# Patient Record
Sex: Female | Born: 1965 | Race: Black or African American | Hispanic: No | Marital: Married | Smoking: Never smoker
Health system: Southern US, Community
[De-identification: ages and names within clinical notes are randomized; demographics above are authoritative.]

## PROBLEM LIST (undated history)

## (undated) DIAGNOSIS — O039 Complete or unspecified spontaneous abortion without complication: Secondary | ICD-10-CM

## (undated) DIAGNOSIS — N6019 Diffuse cystic mastopathy of unspecified breast: Secondary | ICD-10-CM

## (undated) DIAGNOSIS — C801 Malignant (primary) neoplasm, unspecified: Secondary | ICD-10-CM

## (undated) DIAGNOSIS — G43909 Migraine, unspecified, not intractable, without status migrainosus: Secondary | ICD-10-CM

## (undated) DIAGNOSIS — Z803 Family history of malignant neoplasm of breast: Secondary | ICD-10-CM

## (undated) DIAGNOSIS — O09A0 Supervision of pregnancy with history of molar pregnancy, unspecified trimester: Secondary | ICD-10-CM

## (undated) DIAGNOSIS — I10 Essential (primary) hypertension: Secondary | ICD-10-CM

## (undated) DIAGNOSIS — O019 Hydatidiform mole, unspecified: Secondary | ICD-10-CM

## (undated) HISTORY — PX: BREAST LUMPECTOMY: SHX2

## (undated) HISTORY — PX: PORTACATH PLACEMENT: SHX2246

## (undated) HISTORY — PX: WISDOM TOOTH EXTRACTION: SHX21

## (undated) HISTORY — PX: DILATION AND CURETTAGE OF UTERUS: SHX78

## (undated) HISTORY — DX: Migraine, unspecified, not intractable, without status migrainosus: G43.909

## (undated) HISTORY — DX: Family history of malignant neoplasm of breast: Z80.3

## (undated) HISTORY — DX: Essential (primary) hypertension: I10

## (undated) HISTORY — DX: Diffuse cystic mastopathy of unspecified breast: N60.19

## (undated) HISTORY — DX: Hydatidiform mole, unspecified: O01.9

## (undated) HISTORY — DX: Supervision of pregnancy with history of molar pregnancy, unspecified trimester: O09.A0

## (undated) HISTORY — DX: Complete or unspecified spontaneous abortion without complication: O03.9

## (undated) MED FILL — Dexamethasone Sodium Phosphate Inj 100 MG/10ML: INTRAMUSCULAR | Qty: 1 | Status: AC

---

## 1999-02-08 HISTORY — PX: BREAST BIOPSY: SHX20

## 1999-11-11 DIAGNOSIS — Z9889 Other specified postprocedural states: Secondary | ICD-10-CM | POA: Insufficient documentation

## 2006-05-12 ENCOUNTER — Other Ambulatory Visit: Admission: RE | Admit: 2006-05-12 | Discharge: 2006-05-12 | Payer: Self-pay | Admitting: Obstetrics and Gynecology

## 2006-11-21 ENCOUNTER — Ambulatory Visit (HOSPITAL_COMMUNITY): Admission: RE | Admit: 2006-11-21 | Discharge: 2006-11-21 | Payer: Self-pay | Admitting: Obstetrics and Gynecology

## 2006-12-12 ENCOUNTER — Ambulatory Visit (HOSPITAL_COMMUNITY): Admission: RE | Admit: 2006-12-12 | Discharge: 2006-12-12 | Payer: Self-pay | Admitting: Obstetrics and Gynecology

## 2007-06-04 ENCOUNTER — Inpatient Hospital Stay (HOSPITAL_COMMUNITY): Admission: AD | Admit: 2007-06-04 | Discharge: 2007-06-06 | Payer: Self-pay | Admitting: Obstetrics and Gynecology

## 2008-06-03 ENCOUNTER — Other Ambulatory Visit: Admission: RE | Admit: 2008-06-03 | Discharge: 2008-06-03 | Payer: Self-pay | Admitting: Obstetrics and Gynecology

## 2009-02-07 HISTORY — PX: DILATION AND EVACUATION: SHX1459

## 2009-09-11 ENCOUNTER — Other Ambulatory Visit: Admission: RE | Admit: 2009-09-11 | Discharge: 2009-09-11 | Payer: Self-pay | Admitting: Obstetrics and Gynecology

## 2009-10-14 ENCOUNTER — Ambulatory Visit (HOSPITAL_COMMUNITY)
Admission: RE | Admit: 2009-10-14 | Discharge: 2009-10-14 | Payer: Self-pay | Source: Home / Self Care | Admitting: Obstetrics and Gynecology

## 2010-02-28 ENCOUNTER — Encounter: Payer: Self-pay | Admitting: Obstetrics and Gynecology

## 2010-11-02 LAB — CBC
HCT: 24.6 — ABNORMAL LOW
HCT: 32.3 — ABNORMAL LOW
Hemoglobin: 11 — ABNORMAL LOW
Hemoglobin: 8.4 — ABNORMAL LOW
MCHC: 34
MCHC: 34.2
MCV: 88.7
MCV: 90.3
Platelets: 121 — ABNORMAL LOW
Platelets: 142 — ABNORMAL LOW
RBC: 2.73 — ABNORMAL LOW
RBC: 3.64 — ABNORMAL LOW
RDW: 15.1
RDW: 15.2
WBC: 10.2
WBC: 8.2

## 2010-11-02 LAB — RPR: RPR Ser Ql: NONREACTIVE

## 2011-02-08 DIAGNOSIS — O019 Hydatidiform mole, unspecified: Secondary | ICD-10-CM

## 2011-02-08 DIAGNOSIS — C801 Malignant (primary) neoplasm, unspecified: Secondary | ICD-10-CM

## 2011-02-08 HISTORY — DX: Malignant (primary) neoplasm, unspecified: C80.1

## 2011-02-08 HISTORY — DX: Hydatidiform mole, unspecified: O01.9

## 2011-11-08 HISTORY — PX: PORTACATH PLACEMENT: SHX2246

## 2011-11-23 ENCOUNTER — Encounter: Payer: Self-pay | Admitting: Gynecology

## 2011-11-23 ENCOUNTER — Other Ambulatory Visit (HOSPITAL_COMMUNITY): Payer: BC Managed Care – PPO

## 2011-11-23 ENCOUNTER — Ambulatory Visit: Payer: BC Managed Care – PPO | Attending: Gynecology | Admitting: Gynecology

## 2011-11-23 VITALS — BP 120/70 | HR 74 | Temp 98.8°F | Resp 20 | Ht 63.11 in | Wt 122.9 lb

## 2011-11-23 DIAGNOSIS — O019 Hydatidiform mole, unspecified: Secondary | ICD-10-CM | POA: Insufficient documentation

## 2011-11-23 NOTE — Progress Notes (Signed)
Consult Note: Gyn-Onc   Kaitlin Ferrell 46 y.o. female  Chief Complaint  Patient presents with  . GTD    New pt       HPI: 46 year old African American female seen in consultation at the request of Dr. Algie Ferrell regarding management of persistent gestational trophoblastic disease. The patient was found to have a molar pregnancy and underwent an office D&C on 09/30/2011. 3 days prior to the [facility] her hCG level was 21,067 and one week after the D&C had fallen to 339. Subsequent weekly hCGs fell to a low of 82 on September 13 but then began to rise and over 3 consecutive weeks From 133-142-143 (11/11/2011) the Patient Is Currently on Birth Control Pills. She Is Having Some Spotting. She Denies Any Other Abdominal Pain or Pelvic Symptoms. She Has No Pulmonary Symptoms. Overall Her Functional Status Is Excellent.    Review of Systems:10 point review of systems is negative as noted above.   Vitals: Blood pressure 120/70, pulse 74, temperature 98.8 F (37.1 C), temperature source Oral, resp. rate 20, height 5' 3.11" (1.603 m), weight 122 lb 14.4 oz (55.747 kg), last menstrual period 11/09/2011.  Physical Exam: General : The patient is a healthy woman in no acute distress.  HEENT: normocephalic, extraoccular movements normal; neck is supple without thyromegally  Lynphnodes: Supraclavicular and inguinal nodes not enlarged  Abdomen: Soft, non-tender, no ascites, no organomegally, no masses, no hernias  Pelvic:  EGBUS: Normal female  Vagina: Normal, no lesions, there is blood in the vagina  Urethra and Bladder: Normal, non-tender  Cervix: Normal Uterus: Normal in size although there's some irregularity consistent with a uterine fibroid on the right.  Bi-manual examination: Non-tender; no adenxal masses or nodularity  Rectal: normal sphincter tone, no masses, no blood  Lower extremities: No edema or varicosities. Normal range of motion    Assessment/Plan: Rising hCG following evacuation of a  molar pregnancy.  I recommend the patient undergo a staging CT scan of the chest, abdomen and pelvis. If she has no evidence of metastatic disease, or low risk metastatic disease, then I would recommend she be treated with dactinomycin 1.25 mg per meter squared every other week until hCG level is normal. (This regimen is superior to IM methotrexate given weekly in a randomized GOG trial) If she has evidence of metastatic disease then we will need to consider multiagent chemotherapy. We will contact patient with results of her CT scan and make appropriate referral to a medical oncologist. Allergies  Allergen Reactions  . Labetalol Hives, Itching and Swelling    Past Medical History  Diagnosis Date  . Miscarriage   . Fibrocystic breast changes   . Migraine   . GTD (gestational trophoblastic disease)     Past Surgical History  Procedure Date  . Dilation and curettage of uterus 1992, 09/2011  . Wisdom tooth extraction     in 11th grade  . Breast biopsy 2001    Left  . Dilation and evacuation 2011    Current Outpatient Prescriptions  Medication Sig Dispense Refill  . ibuprofen (ADVIL,MOTRIN) 200 MG tablet Take 200 mg by mouth every 6 (six) hours as needed.      . Norethindrone-Ethinyl Estradiol-Fe Biphas (LO LOESTRIN FE) 1 MG-10 MCG / 10 MCG tablet Take 1 tablet by mouth daily.      . SUMAtriptan (IMITREX) 100 MG tablet Take 100 mg by mouth every 2 (two) hours as needed.      . topiramate (TOPAMAX) 25 MG tablet Take 25  mg by mouth 3 (three) times daily.        History   Social History  . Marital Status: Married    Spouse Name: N/A    Number of Children: N/A  . Years of Education: N/A   Occupational History  . Not on file.   Social History Main Topics  . Smoking status: Never Smoker   . Smokeless tobacco: Not on file  . Alcohol Use: Yes     occas  . Drug Use: No  . Sexually Active: Not on file   Other Topics Concern  . Not on file   Social History Narrative  . No  narrative on file    Family History  Problem Relation Age of Onset  . Breast cancer Mother   . Heart attack Mother   . Diabetes Father   . Breast cancer Maternal Aunt   . Breast cancer Maternal Grandmother   . Stroke Paternal Grandmother   . Breast cancer Maternal Aunt       Jeannette Corpus, MD 11/23/2011, 12:56 PM

## 2011-11-23 NOTE — Patient Instructions (Signed)
We'll obtain a CT scan as soon as possible. We'll contact you with the scan results as well as make arrangements really seen by a medical oncologist.

## 2011-11-24 ENCOUNTER — Other Ambulatory Visit: Payer: Self-pay

## 2011-11-24 ENCOUNTER — Telehealth: Payer: Self-pay | Admitting: Oncology

## 2011-11-24 DIAGNOSIS — O019 Hydatidiform mole, unspecified: Secondary | ICD-10-CM

## 2011-11-24 NOTE — Progress Notes (Signed)
Discussed central line placement with Ms. Kaitlin Ferrell.  She states that her veins are difficult to stick.  A butterfly needle is used in her antecubital.  Pt. Comfortable with Pac placement and sent order to IR to have it set up for 12-01-11.

## 2011-11-24 NOTE — Telephone Encounter (Signed)
S/W pt in re NP appt 10/23 @ 1 w/ Dr. Darrold Span  Chemo Edu 10/22 @ 9:30

## 2011-11-25 ENCOUNTER — Telehealth: Payer: Self-pay | Admitting: Oncology

## 2011-11-25 ENCOUNTER — Encounter (HOSPITAL_COMMUNITY): Payer: Self-pay | Admitting: Pharmacy Technician

## 2011-11-25 ENCOUNTER — Ambulatory Visit (HOSPITAL_COMMUNITY)
Admission: RE | Admit: 2011-11-25 | Discharge: 2011-11-25 | Disposition: A | Discharge status: Home / Self Care | Payer: BC Managed Care – PPO | Source: Ambulatory Visit | Attending: Gynecologic Oncology | Admitting: Gynecologic Oncology

## 2011-11-25 ENCOUNTER — Other Ambulatory Visit: Payer: Self-pay | Admitting: Radiology

## 2011-11-25 ENCOUNTER — Other Ambulatory Visit (HOSPITAL_COMMUNITY): Payer: BC Managed Care – PPO

## 2011-11-25 DIAGNOSIS — J984 Other disorders of lung: Secondary | ICD-10-CM | POA: Insufficient documentation

## 2011-11-25 DIAGNOSIS — K7689 Other specified diseases of liver: Secondary | ICD-10-CM | POA: Insufficient documentation

## 2011-11-25 DIAGNOSIS — O019 Hydatidiform mole, unspecified: Secondary | ICD-10-CM | POA: Insufficient documentation

## 2011-11-25 DIAGNOSIS — K429 Umbilical hernia without obstruction or gangrene: Secondary | ICD-10-CM | POA: Insufficient documentation

## 2011-11-25 MED ORDER — IOHEXOL 300 MG/ML  SOLN
100.0000 mL | Freq: Once | INTRAMUSCULAR | Status: AC | PRN
Start: 2011-11-25 — End: 2011-11-25
  Administered 2011-11-25: 100 mL via INTRAVENOUS

## 2011-11-25 NOTE — Telephone Encounter (Signed)
Kaitlin Ferrell called to let Dr. Darrold Span know that the portacath is to be placed on Tuesday 11-29-11 at [facility] at 1430.  [name] is aware of appt.

## 2011-11-25 NOTE — Telephone Encounter (Signed)
C/D 11/25/11 for appt 11/30/11 °

## 2011-11-28 ENCOUNTER — Telehealth: Payer: Self-pay | Admitting: Gynecologic Oncology

## 2011-11-28 NOTE — Telephone Encounter (Signed)
Pt notified of CT scan results:  No definitive evidence of metastatic disease in the chest, abdomen, or pelvis.  Appointment arranged with Dr. Darrold Span for Nov 30, 2011.  Instructed to call for any questions or concerns.  No issues voiced.

## 2011-11-29 ENCOUNTER — Ambulatory Visit (HOSPITAL_COMMUNITY)
Admission: RE | Admit: 2011-11-29 | Discharge: 2011-11-29 | Disposition: A | Discharge status: Home / Self Care | Payer: BC Managed Care – PPO | Source: Ambulatory Visit | Attending: Oncology | Admitting: Oncology

## 2011-11-29 ENCOUNTER — Other Ambulatory Visit: Payer: BC Managed Care – PPO

## 2011-11-29 ENCOUNTER — Other Ambulatory Visit: Payer: Self-pay | Admitting: Oncology

## 2011-11-29 DIAGNOSIS — O019 Hydatidiform mole, unspecified: Secondary | ICD-10-CM | POA: Insufficient documentation

## 2011-11-29 LAB — CBC
HCT: 37.3 % (ref 36.0–46.0)
Hemoglobin: 12.4 g/dL (ref 12.0–15.0)
MCH: 28.5 pg (ref 26.0–34.0)
MCHC: 33.2 g/dL (ref 30.0–36.0)
MCV: 85.7 fL (ref 78.0–100.0)
Platelets: 211 10*3/uL (ref 150–400)
RBC: 4.35 MIL/uL (ref 3.87–5.11)
RDW: 13.6 % (ref 11.5–15.5)
WBC: 6.9 10*3/uL (ref 4.0–10.5)

## 2011-11-29 LAB — APTT: aPTT: 33 seconds (ref 24–37)

## 2011-11-29 LAB — PROTIME-INR
INR: 1.02 (ref 0.00–1.49)
Prothrombin Time: 13.3 seconds (ref 11.6–15.2)

## 2011-11-29 MED ORDER — FENTANYL CITRATE 0.05 MG/ML IJ SOLN
INTRAMUSCULAR | Status: AC | PRN
  Administered 2011-11-29 (×2): 100 ug via INTRAVENOUS

## 2011-11-29 MED ORDER — HEPARIN SOD (PORK) LOCK FLUSH 100 UNIT/ML IV SOLN
500.0000 [IU] | Freq: Once | INTRAVENOUS | Status: AC
  Administered 2011-11-29: 500 [IU] via INTRAVENOUS

## 2011-11-29 MED ORDER — MIDAZOLAM HCL 2 MG/2ML IJ SOLN
INTRAMUSCULAR | Status: AC | PRN
  Administered 2011-11-29 (×2): 2 mg via INTRAVENOUS

## 2011-11-29 MED ORDER — SODIUM CHLORIDE 0.9 % IV SOLN: INTRAVENOUS | Status: DC

## 2011-11-29 MED ORDER — LORAZEPAM 2 MG/ML IJ SOLN: INTRAMUSCULAR | Status: AC | PRN

## 2011-11-29 MED ORDER — LIDOCAINE HCL 1 % IJ SOLN
INTRAMUSCULAR | Status: AC
  Filled 2011-11-29: qty 20

## 2011-11-29 MED ORDER — CEFAZOLIN SODIUM 1-5 GM-% IV SOLN
1.0000 g | INTRAVENOUS | Status: AC
  Administered 2011-11-29: 1 g via INTRAVENOUS

## 2011-11-29 MED ORDER — CEFAZOLIN SODIUM 1-5 GM-% IV SOLN
INTRAVENOUS | Status: AC
  Filled 2011-11-29: qty 50

## 2011-11-29 MED ORDER — MIDAZOLAM HCL 2 MG/2ML IJ SOLN
INTRAMUSCULAR | Status: AC
  Filled 2011-11-29: qty 6

## 2011-11-29 MED ORDER — FENTANYL CITRATE 0.05 MG/ML IJ SOLN
INTRAMUSCULAR | Status: AC
  Filled 2011-11-29: qty 6

## 2011-11-29 NOTE — H&P (Signed)
Kaitlin Ferrell is an 46 y.o. female.   Chief Complaint: " I'm here for a port a cath" HPI: Patient with history of persistent gestational trophoblastic disease presents today for port a cath placement prior to planned chemotherapy.  Past Medical History  Diagnosis Date  . Miscarriage   . Fibrocystic breast changes   . Migraine   . GTD (gestational trophoblastic disease)     Past Surgical History  Procedure Date  . Dilation and curettage of uterus 1992, 09/2011  . Wisdom tooth extraction     in 11th grade  . Breast biopsy 2001    Left  . Dilation and evacuation 2011    Family History  Problem Relation Age of Onset  . Breast cancer Mother   . Heart attack Mother   . Diabetes Father   . Breast cancer Maternal Aunt   . Breast cancer Maternal Grandmother   . Stroke Paternal Grandmother   . Breast cancer Maternal Aunt    Social History:  reports that she has never smoked. She does not have any smokeless tobacco history on file. She reports that she drinks alcohol. She reports that she does not use illicit drugs.  Allergies:  Allergies  Allergen Reactions  . Labetalol Hives, Itching and Swelling    Current outpatient prescriptions:ibuprofen (ADVIL,MOTRIN) 200 MG tablet, Take 400 mg by mouth every 6 (six) hours as needed. Pain, Disp: , Rfl: ;  Norethindrone-Ethinyl Estradiol-Fe Biphas (LO LOESTRIN FE) 1 MG-10 MCG / 10 MCG tablet, Take 1 tablet by mouth daily., Disp: , Rfl: ;  SUMAtriptan (IMITREX) 100 MG tablet, Take 100 mg by mouth every 2 (two) hours as needed. margines, Disp: , Rfl:  topiramate (TOPAMAX) 25 MG tablet, Take 25 mg by mouth 3 (three) times daily., Disp: , Rfl:  Current facility-administered medications:0.9 %  sodium chloride infusion, , Intravenous, Continuous, D Kevin Allred, PA;  ceFAZolin (ANCEF) 1-5 GM-% IVPB, , , , ;  ceFAZolin (ANCEF) IVPB 1 g/50 mL premix, 1 g, Intravenous, On Call, D Kevin Allred, PA;  fentaNYL (SUBLIMAZE) 0.05 MG/ML injection, , , , ;   lidocaine (XYLOCAINE) 1 % (with pres) injection, , , , ;  midazolam (VERSED) 2 MG/2ML injection, , , ,    Results for orders placed during the hospital encounter of 11/29/11 (from the past 48 hour(s))  APTT     Status: Normal   Collection Time   11/29/11  1:13 PM      Component Value Range Comment   aPTT 33  24 - 37 seconds   CBC     Status: Normal   Collection Time   11/29/11  1:13 PM      Component Value Range Comment   WBC 6.9  4.0 - 10.5 K/uL    RBC 4.35  3.87 - 5.11 MIL/uL    Hemoglobin 12.4  12.0 - 15.0 g/dL    HCT 96.0  45.4 - 09.8 %    MCV 85.7  78.0 - 100.0 fL    MCH 28.5  26.0 - 34.0 pg    MCHC 33.2  30.0 - 36.0 g/dL    RDW 11.9  14.7 - 82.9 %    Platelets 211  150 - 400 K/uL   PROTIME-INR     Status: Normal   Collection Time   11/29/11  1:13 PM      Component Value Range Comment   Prothrombin Time 13.3  11.6 - 15.2 seconds    INR 1.02  0.00 - 1.49  No results found.  Review of Systems  Constitutional: Negative for fever and chills.  Respiratory: Negative for cough and shortness of breath.   Cardiovascular: Negative for chest pain.  Gastrointestinal: Negative for nausea, vomiting and abdominal pain.  Musculoskeletal: Negative for back pain.  Neurological: Positive for headaches.  Endo/Heme/Allergies: Does not bruise/bleed easily.    Blood pressure 133/61, pulse 47, temperature 98.7 F (37.1 C), temperature source Oral, resp. rate 18, height 5\' 2"  (1.575 m), weight 122 lb (55.339 kg), last menstrual period 11/09/2011, SpO2 100.00%. Physical Exam  Constitutional: She is oriented to person, place, and time. She appears well-developed and well-nourished.  Cardiovascular:       Bradycardic but reg rhythm  Respiratory: Effort normal and breath sounds normal.  GI: Soft. Bowel sounds are normal. There is no tenderness.  Musculoskeletal: Normal range of motion. She exhibits no edema.  Neurological: She is alert and oriented to person, place, and time.      Assessment/Plan Pt with history of persistent gestational trophoblastic disease. Plan is for port a cath placement prior to chemotherapy. Details/risks of procedure d/w pt/husband with their understanding and consent.  ALLRED,D KEVIN 11/29/2011, 2:08 PM

## 2011-11-29 NOTE — Procedures (Signed)
R IJ SL PowerPort No complication No blood loss. See complete dictation in [facility].

## 2011-11-30 ENCOUNTER — Encounter: Payer: Self-pay | Admitting: Oncology

## 2011-11-30 ENCOUNTER — Ambulatory Visit: Payer: BC Managed Care – PPO

## 2011-11-30 ENCOUNTER — Telehealth: Payer: Self-pay | Admitting: Oncology

## 2011-11-30 ENCOUNTER — Ambulatory Visit (HOSPITAL_BASED_OUTPATIENT_CLINIC_OR_DEPARTMENT_OTHER): Payer: BC Managed Care – PPO | Admitting: Oncology

## 2011-11-30 ENCOUNTER — Other Ambulatory Visit (HOSPITAL_BASED_OUTPATIENT_CLINIC_OR_DEPARTMENT_OTHER): Payer: BC Managed Care – PPO | Admitting: Lab

## 2011-11-30 VITALS — BP 127/71 | HR 76 | Temp 97.1°F | Resp 20 | Ht 62.0 in | Wt 124.1 lb

## 2011-11-30 DIAGNOSIS — G43909 Migraine, unspecified, not intractable, without status migrainosus: Secondary | ICD-10-CM

## 2011-11-30 DIAGNOSIS — Z803 Family history of malignant neoplasm of breast: Secondary | ICD-10-CM

## 2011-11-30 DIAGNOSIS — O019 Hydatidiform mole, unspecified: Secondary | ICD-10-CM

## 2011-11-30 DIAGNOSIS — D392 Neoplasm of uncertain behavior of placenta: Secondary | ICD-10-CM

## 2011-11-30 LAB — CBC WITH DIFFERENTIAL/PLATELET
BASO%: 0.6 % (ref 0.0–2.0)
Basophils Absolute: 0 10*3/uL (ref 0.0–0.1)
EOS%: 2 % (ref 0.0–7.0)
Eosinophils Absolute: 0.1 10*3/uL (ref 0.0–0.5)
HCT: 38.7 % (ref 34.8–46.6)
HGB: 12.7 g/dL (ref 11.6–15.9)
LYMPH%: 31.4 % (ref 14.0–49.7)
MCH: 29.2 pg (ref 25.1–34.0)
MCHC: 32.7 g/dL (ref 31.5–36.0)
MCV: 89.2 fL (ref 79.5–101.0)
MONO#: 0.4 10*3/uL (ref 0.1–0.9)
MONO%: 6.2 % (ref 0.0–14.0)
NEUT#: 3.7 10*3/uL (ref 1.5–6.5)
NEUT%: 59.8 % (ref 38.4–76.8)
Platelets: 181 10*3/uL (ref 145–400)
RBC: 4.34 10*6/uL (ref 3.70–5.45)
RDW: 13.7 % (ref 11.2–14.5)
WBC: 6.2 10*3/uL (ref 3.9–10.3)
lymph#: 2 10*3/uL (ref 0.9–3.3)

## 2011-11-30 LAB — COMPREHENSIVE METABOLIC PANEL (CC13)
ALT: 8 U/L (ref 0–55)
AST: 12 U/L (ref 5–34)
Albumin: 3.9 g/dL (ref 3.5–5.0)
Alkaline Phosphatase: 37 U/L — ABNORMAL LOW (ref 40–150)
BUN: 8 mg/dL (ref 7.0–26.0)
CO2: 19 mEq/L — ABNORMAL LOW (ref 22–29)
Calcium: 9.2 mg/dL (ref 8.4–10.4)
Chloride: 109 mEq/L — ABNORMAL HIGH (ref 98–107)
Creatinine: 0.9 mg/dL (ref 0.6–1.1)
Glucose: 88 mg/dl (ref 70–99)
Potassium: 3.7 mEq/L (ref 3.5–5.1)
Sodium: 139 mEq/L (ref 136–145)
Total Bilirubin: 0.5 mg/dL (ref 0.20–1.20)
Total Protein: 7.3 g/dL (ref 6.4–8.3)

## 2011-11-30 LAB — HCG, QUANTITATIVE, PREGNANCY: hCG, Beta Chain, Quant, S: 45.9 m[IU]/mL

## 2011-11-30 NOTE — Telephone Encounter (Signed)
gv pt appt schedule for October and November.  °

## 2011-11-30 NOTE — Progress Notes (Signed)
Checked in new patient. No issues at this time.

## 2011-11-30 NOTE — Progress Notes (Signed)
[facility] NEW PATIENT EVALUATION   Name: Kaitlin Ferrell Date: 11/30/2011 MRN: [id number] DOB: [birth year]  REFERRING PHYSICIAN: D.ClarkePearson CC: Angelica Chessman., MD, Ernestina Penna K,MD   REASON FOR REFERRAL: Patient is seen in consultation at the request of Dr Yolande Jolly, for chemotherapy for persistent GTD.    HISTORY OF PRESENT ILLNESS:Kaitlin Ferrell is a 46 y.o. female who is seen, together with husband, for consideration of chemotherapy in [place] for persistent GTD.  Patient had abnormality with first trimester pregnancy in 2011, tho all of that information is not available in this EMR; she had termination of that pregnancy. With this history, she had US done promptly with apparent pregnancy in August 2013, which found molar pregnancy. She had HCG of  21,067 on 09-27-11, then office D&C 09-30-11. She has been on oral BCP since August. HCG decreased to 339 at one week then continued to decline down to 82 on Sept 13. Subsequently the marker rose for 3 weeks, at 133,142 and 143 (11-11-11). She was seen by Dr Yolande Jolly on 11-23-11 and had CT CAP in [facility] system on 11-25-11 which showed no evidence of metastatic disease. Dr Yolande Jolly has recommended every other week actinomycin D at dose of 1.25 mg/m2 based on GOG informatiion (references include JCO 2011, Phase III Weekly MTX or Pulsed Actinomycin D, [place], [place], [place], [place]). Patient and husband have had chemotherapy teaching for actinomycin D and patient has had PAC placed by IR on 11-30-11. She has some vaginal spotting, no abdominal or pelvic cramping or pain, no respiratory symptoms, no nausea, no change in chronic migraine HAs (see below).                 REVIEW OF SYSTEMS:  Frequent migraine HA x 2 years, initially evaluated by PCP with brain scan done in [facility] and now managed with generic topomax and imitrex. Initially she had migraines daily, now has one "bad" migraine ~ weekly,  last ~ a week ago. She has not been seen for HA by physicians other than PCP. No other new or different neurologic symptoms  Weight is stable, appetite at baseline, energy at baseline. Good visual acuity with glasses, no decreased hearing, seasonal allergic sinusitis, no dental problems/ up to date on cleanings, no lower respiratory symptoms or chest pain, no cardiac symptoms, no thyroid symptoms. No baseline nausea or GERD, bowels move regularly, no bladder symptoms. No arthritis symptoms, no history of unusual bleeding or blood clots. No changes noted on breast self exam, with mammograms done at [facility] a year ago and 11-25-11 (will request that report).  ALLERGIES: Labetalol  PAST MEDICAL HISTORY:  has a past medical history of Miscarriage; Fibrocystic breast changes; Migraine; and GTD (gestational trophoblastic disease).   Left breast biopsy 2001, benign Personal testing for BRCA 1 and 2 negative Migraines as above. 2 D&Cs 1 D&E 2011 1 son age 51 Mammograms [facility] 11-25-11 Brain imaging in [place] 2 years ago with evaluation of migraines. No transfusions  CURRENT MEDICATIONS: reviewed as listed in EMR. Note no increase in migraines with present BCP. She has never used zofran.She has tolerated phenergan well when used previously with migraines. She uses prn ibuprofen also for migraines and prn sudafed for allergies. Insurance covers generics in preference to name brands.   SOCIAL HISTORY: Originally from [place], lives with husband and 15 yo son in [place]. 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 No tobacco or drugs, social ETOH.  FAMILY HISTORY: family history includes Breast cancer in her maternal aunts, maternal grandmother, and mother; Diabetes in her father; Heart attack in her mother; and Stroke in her paternal grandmother. Maternal grandmother died of breast  cancer Mother and 2 maternal aunts (out of 13 siblings) had post menopausal breast cancer. One older sister healthy 52 yo son healthy  LABORATORY DATA:  Results for orders placed in visit on 11/30/11 (from the past 48 hour(s))  CBC WITH DIFFERENTIAL     Status: Normal   Collection Time   11/30/11  1:40 PM      Component Value Range Comment   WBC 6.2  3.9 - 10.3 10e3/uL    NEUT# 3.7  1.5 - 6.5 10e3/uL    HGB 12.7  11.6 - 15.9 g/dL    HCT 14.7  82.9 - 56.2 %    Platelets 181  145 - 400 10e3/uL    MCV 89.2  79.5 - 101.0 fL    MCH 29.2  25.1 - 34.0 pg    MCHC 32.7  31.5 - 36.0 g/dL    RBC 1.30  8.65 - 7.84 10e6/uL    RDW 13.7  11.2 - 14.5 %    lymph# 2.0  0.9 - 3.3 10e3/uL    MONO# 0.4  0.1 - 0.9 10e3/uL    Eosinophils Absolute 0.1  0.0 - 0.5 10e3/uL    Basophils Absolute 0.0  0.0 - 0.1 10e3/uL    NEUT% 59.8  38.4 - 76.8 %    LYMPH% 31.4  14.0 - 49.7 %    MONO% 6.2  0.0 - 14.0 %    EOS% 2.0  0.0 - 7.0 %    BASO% 0.6  0.0 - 2.0 %   COMPREHENSIVE METABOLIC PANEL (CC13)     Status: Abnormal   Collection Time   11/30/11  1:40 PM      Component Value Range Comment   Sodium 139  136 - 145 mEq/L    Potassium 3.7  3.5 - 5.1 mEq/L    Chloride 109 (*) 98 - 107 mEq/L    CO2 19 (*) 22 - 29 mEq/L    Glucose 88  70 - 99 mg/dl    BUN 8.0  7.0 - 69.6 mg/dL    Creatinine 0.9  0.6 - 1.1 mg/dL    Total Bilirubin 2.95  0.20 - 1.20 mg/dL    Alkaline Phosphatase 37 (*) 40 - 150 U/L    AST 12  5 - 34 U/L    ALT 8  0 - 55 U/L    Total Protein 7.3  6.4 - 8.3 g/dL    Albumin 3.9  3.5 - 5.0 g/dL    Calcium 9.2  8.4 - 28.4 mg/dL      Quantitative HCG available after visit reported 45.9  RADIOGRAPHY: 11-25-11 Comparison: None.  CT CHEST  Findings: No pathologically enlarged mediastinal, hilar or  axillary lymph nodes. Heart size normal. No pericardial effusion.  Very minimal biapical scarring. Focal pleural thickening along the  posterior aspect of the left hemithorax (image 25),  nonspecific.  Probable post infectious scarring in the peripheral left upper lobe  (image 18). No discrete pulmonary nodules. No pleural fluid.  Airway is unremarkable.  IMPRESSION:  1. No definitive evidence of metastatic disease in the chest.  2. Focal area of nonspecific pleural thickening the posterior left  hemithorax.  CT ABDOMEN AND PELVIS  Findings: Periumbilical hernia  contains fat. Low attenuation  lesions in the liver measure 5 mm or less in size and are too small  to characterize. Gallbladder, adrenal glands, kidneys, spleen,  pancreas, stomach and bowel are unremarkable.  Left gonadal vein is prominent and is seen in association with  extremely prominent veins in the left adnexa. Uterus is somewhat  heterogeneous in appearance. Ovaries are visualized. No  pathologically enlarged lymph nodes. No free fluid. No worrisome  lytic or sclerotic lesions.  IMPRESSION:  1. No definitive evidence of metastatic disease in the abdomen or  pelvis.  2. Heterogeneous appearing uterus with extremely prominent veins  in the left adnexa, in this patient with history of gestational  trophoblastic disease.  Original Report Authenticated By: Reyes Ivan, M.D.        PHYSICAL EXAM:  height is 5\' 2"  (1.575 m) and weight is 124 lb 1.6 oz (56.291 kg). Her oral temperature is 97.1 F (36.2 C). Her blood pressure is 127/71 and her pulse is 76. Her respiration is 20.  Healthy appearing lady, alert and appropriate, easily mobile, looks comfortable, good historian. Husband very supportive. HEENT: normal hair pattern, PERRL, EOMI, oral mucosa moist and clear, good dentition with fillings, neck supple without thyroid mass, JVD. Lungs clear to A&P. Back nontender. Heart RRR without gallop. PAC right anterior chest site tender but without significant bruising and no erythema, no drainage or bleeding. Breasts: left without dominant mass, skin or nipple findings. Right breast exam limited due to  discomfort from newly placed PAC, but no obvious mass or skin concerns. Lymphatics: no palpable adenopathy cervical, supraclavicular, axillary or inguinal. Abdomen soft and nontender without mass or HSM, normal bowel sounds, not distended. LE without edema, cords, tenderness. Skin warm and dry without rash. Neuro: CN, motor, sensory, cerebellar nonfocal.     We have discussed course to this point as recorded above, recommendations for systemic chemotherapy using the q o week actinomycin D regimen and discussed follow up at this office thru treatment. She understands that [facility] was necessary for safe administration of this vesicant chemotherapeutic agent and that PAC will be removed when no longer needed. We have discussed antiemetics, and will try to avoid zofran at least for now due to preexisting migraines. She understands that number of treatments depends on response of the persistent GTD, with 2011 JCO information reporting range of 2- 14 cycles of Act-D needed in that cohort. We will send in prescriptions for EMLA, promethazine 25 mg and lorazepam.0.5 mg to [facility]. Patient is aware of sedation with antiemetics, and brief amnesia with lorazepam. I have suggested that she take antiemetic night of chemo and next AM whether or not any nausea. They understand that they can call at any time if needed. All questions were answered to their satisfaction and patient and husband are in agreement with plan.  IMPRESSION / PLAN:  1. Persistent GTD without evidence of metastatic disease: first cycle of actinomycin D at 1.25 mg/m2 will be given on 12-02-11. We will follow BHCG weekly and I will see her prior to cycle 2. 2. Migraine headaches x 2 years, unchanged since this diagnosis or with BCP. She will continue medications as presently. 3.PAC in, necessary with this regimen 4.environmental allergies 5.family history of breast cancer, with negative BRCA testing in patient    Reece Packer,  MD 11/30/2011 8:15 PM

## 2011-12-01 ENCOUNTER — Other Ambulatory Visit: Payer: Self-pay | Admitting: *Deleted

## 2011-12-01 ENCOUNTER — Other Ambulatory Visit: Payer: Self-pay | Admitting: Oncology

## 2011-12-01 DIAGNOSIS — O019 Hydatidiform mole, unspecified: Secondary | ICD-10-CM

## 2011-12-01 MED ORDER — LORAZEPAM 0.5 MG PO TABS: ORAL_TABLET | ORAL | Status: DC

## 2011-12-01 MED ORDER — LIDOCAINE-PRILOCAINE 2.5-2.5 % EX CREA: TOPICAL_CREAM | CUTANEOUS | Status: DC | PRN

## 2011-12-01 MED ORDER — PROMETHAZINE HCL 25 MG PO TABS: 25.0000 mg | ORAL_TABLET | Freq: Four times a day (QID) | ORAL | Status: DC | PRN

## 2011-12-02 ENCOUNTER — Ambulatory Visit (HOSPITAL_BASED_OUTPATIENT_CLINIC_OR_DEPARTMENT_OTHER): Payer: BC Managed Care – PPO

## 2011-12-02 VITALS — BP 138/84 | HR 94 | Temp 98.1°F | Resp 18

## 2011-12-02 DIAGNOSIS — Z5111 Encounter for antineoplastic chemotherapy: Secondary | ICD-10-CM

## 2011-12-02 DIAGNOSIS — D392 Neoplasm of uncertain behavior of placenta: Secondary | ICD-10-CM

## 2011-12-02 DIAGNOSIS — O019 Hydatidiform mole, unspecified: Secondary | ICD-10-CM

## 2011-12-02 MED ORDER — SODIUM CHLORIDE 0.9 % IJ SOLN
10.0000 mL | INTRAMUSCULAR | Status: DC | PRN
  Administered 2011-12-02: 10 mL
  Filled 2011-12-02: qty 10

## 2011-12-02 MED ORDER — HEPARIN SOD (PORK) LOCK FLUSH 100 UNIT/ML IV SOLN
500.0000 [IU] | Freq: Once | INTRAVENOUS | Status: AC | PRN
  Administered 2011-12-02: 500 [IU]
  Filled 2011-12-02: qty 5

## 2011-12-02 MED ORDER — SODIUM CHLORIDE 0.9 % IV SOLN
1.2500 mg/m2 | Freq: Once | INTRAVENOUS | Status: AC
  Administered 2011-12-02: 1950 ug via INTRAVENOUS
  Filled 2011-12-02: qty 3.9

## 2011-12-02 MED ORDER — DEXAMETHASONE SODIUM PHOSPHATE 10 MG/ML IJ SOLN
10.0000 mg | Freq: Once | INTRAMUSCULAR | Status: AC
  Administered 2011-12-02: 10 mg via INTRAVENOUS

## 2011-12-02 MED ORDER — SODIUM CHLORIDE 0.9 % IV SOLN
Freq: Once | INTRAVENOUS | Status: AC
  Administered 2011-12-02: 11:00:00 via INTRAVENOUS

## 2011-12-02 MED ORDER — PROCHLORPERAZINE EDISYLATE 5 MG/ML IJ SOLN
10.0000 mg | Freq: Once | INTRAMUSCULAR | Status: AC
  Administered 2011-12-02: 10 mg via INTRAVENOUS

## 2011-12-02 NOTE — Patient Instructions (Addendum)
[facility] Discharge Instructions for Patients Receiving Chemotherapy  Today you received the following chemotherapy agents: Dactinomycin  To help prevent nausea and vomiting after your treatment, we encourage you to take your nausea medication as directed by your MD. If you develop nausea and vomiting that is not controlled by your nausea medication, call the clinic. If it is after clinic hours your family physician or the after hours number for the clinic or go to the Emergency Department.   BELOW ARE SYMPTOMS THAT SHOULD BE REPORTED IMMEDIATELY:  *FEVER GREATER THAN 100.5 F  *CHILLS WITH OR WITHOUT FEVER  NAUSEA AND VOMITING THAT IS NOT CONTROLLED WITH YOUR NAUSEA MEDICATION  *UNUSUAL SHORTNESS OF BREATH  *UNUSUAL BRUISING OR BLEEDING  TENDERNESS IN MOUTH AND THROAT WITH OR WITHOUT PRESENCE OF ULCERS  *URINARY PROBLEMS  *BOWEL PROBLEMS  UNUSUAL RASH Items with * indicate a potential emergency and should be followed up as soon as possible.  One of the nurses will contact you Monday. Please let the nurse know about any problems that you may have experienced. Feel free to call the clinic you have any questions or concerns. The clinic phone number is [phone].

## 2011-12-04 ENCOUNTER — Other Ambulatory Visit: Payer: Self-pay | Admitting: Oncology

## 2011-12-05 ENCOUNTER — Telehealth: Payer: Self-pay | Admitting: *Deleted

## 2011-12-05 NOTE — Telephone Encounter (Signed)
Message copied by Augusto Garbe on Mon Dec 05, 2011  4:23 PM ------      Message from: Faith Rogue F      Created: Fri Dec 02, 2011 11:23 AM      Regarding: chemo f/u call       Dactinomycin.      Dr. Darrold Span

## 2011-12-05 NOTE — Telephone Encounter (Signed)
Kaitlin Ferrell is doing fairly well.  Reports slight nausea.  No emesis but has taken anti-emetic.  Appetite decreased due to foods not tasting the same so "eating plain simple foods and trying to drink eight glasses water each day".  Reports feeling tired.  Asked why her skin feels tight like she has the flu.  Denies fever just skin feels different.  Has taken warm showers and baths and this helps.  This chemo agent can cause muscle aches or cramping shared with her so she will try tylenol.  No further questions.

## 2011-12-07 ENCOUNTER — Encounter: Payer: Self-pay | Admitting: Oncology

## 2011-12-07 ENCOUNTER — Other Ambulatory Visit (HOSPITAL_BASED_OUTPATIENT_CLINIC_OR_DEPARTMENT_OTHER): Payer: BC Managed Care – PPO | Admitting: Lab

## 2011-12-07 ENCOUNTER — Other Ambulatory Visit: Payer: Self-pay

## 2011-12-07 ENCOUNTER — Ambulatory Visit (HOSPITAL_BASED_OUTPATIENT_CLINIC_OR_DEPARTMENT_OTHER): Payer: BC Managed Care – PPO | Admitting: Oncology

## 2011-12-07 ENCOUNTER — Telehealth: Payer: Self-pay | Admitting: Oncology

## 2011-12-07 VITALS — BP 116/72 | HR 90 | Temp 97.5°F | Resp 20 | Ht 62.0 in | Wt 123.6 lb

## 2011-12-07 DIAGNOSIS — O019 Hydatidiform mole, unspecified: Secondary | ICD-10-CM

## 2011-12-07 DIAGNOSIS — D392 Neoplasm of uncertain behavior of placenta: Secondary | ICD-10-CM

## 2011-12-07 DIAGNOSIS — K1232 Oral mucositis (ulcerative) due to other drugs: Secondary | ICD-10-CM

## 2011-12-07 LAB — CBC WITH DIFFERENTIAL/PLATELET
BASO%: 0.7 % (ref 0.0–2.0)
Basophils Absolute: 0 10*3/uL (ref 0.0–0.1)
EOS%: 2.4 % (ref 0.0–7.0)
Eosinophils Absolute: 0.1 10*3/uL (ref 0.0–0.5)
HCT: 37.8 % (ref 34.8–46.6)
HGB: 12.5 g/dL (ref 11.6–15.9)
LYMPH%: 30.7 % (ref 14.0–49.7)
MCH: 29.4 pg (ref 25.1–34.0)
MCHC: 33 g/dL (ref 31.5–36.0)
MCV: 89.1 fL (ref 79.5–101.0)
MONO#: 0.2 10*3/uL (ref 0.1–0.9)
MONO%: 4.2 % (ref 0.0–14.0)
NEUT#: 2.5 10*3/uL (ref 1.5–6.5)
NEUT%: 62 % (ref 38.4–76.8)
Platelets: 135 10*3/uL — ABNORMAL LOW (ref 145–400)
RBC: 4.24 10*6/uL (ref 3.70–5.45)
RDW: 13.4 % (ref 11.2–14.5)
WBC: 4.1 10*3/uL (ref 3.9–10.3)
lymph#: 1.2 10*3/uL (ref 0.9–3.3)

## 2011-12-07 LAB — COMPREHENSIVE METABOLIC PANEL (CC13)
ALT: 8 U/L (ref 0–55)
AST: 13 U/L (ref 5–34)
Albumin: 3.9 g/dL (ref 3.5–5.0)
Alkaline Phosphatase: 34 U/L — ABNORMAL LOW (ref 40–150)
BUN: 9 mg/dL (ref 7.0–26.0)
CO2: 23 mEq/L (ref 22–29)
Calcium: 9.5 mg/dL (ref 8.4–10.4)
Chloride: 110 mEq/L — ABNORMAL HIGH (ref 98–107)
Creatinine: 0.8 mg/dL (ref 0.6–1.1)
Glucose: 104 mg/dl — ABNORMAL HIGH (ref 70–99)
Potassium: 4 mEq/L (ref 3.5–5.1)
Sodium: 138 mEq/L (ref 136–145)
Total Bilirubin: 0.31 mg/dL (ref 0.20–1.20)
Total Protein: 7.3 g/dL (ref 6.4–8.3)

## 2011-12-07 LAB — HCG, QUANTITATIVE, PREGNANCY: hCG, Beta Chain, Quant, S: 43.6 m[IU]/mL

## 2011-12-07 MED ORDER — SUCRALFATE 1 GM/10ML PO SUSP: 1.0000 g | Freq: Three times a day (TID) | ORAL | Status: DC

## 2011-12-07 MED ORDER — PANTOPRAZOLE SODIUM 40 MG PO TBEC: 40.0000 mg | DELAYED_RELEASE_TABLET | Freq: Every day | ORAL | Status: DC

## 2011-12-07 MED ORDER — MAGIC MOUTHWASH W/LIDOCAINE: 5.0000 mL | Freq: Four times a day (QID) | ORAL | Status: DC | PRN

## 2011-12-07 NOTE — Telephone Encounter (Signed)
gv and printed pt appt schedule for NOV °

## 2011-12-07 NOTE — Progress Notes (Signed)
OFFICE PROGRESS NOTE   12/07/2011   Physicians: D.ClarkePearson; Angelica Chessman., MD, Ernestina Penna K,MD   INTERVAL HISTORY:  Patient is seen, together with husband, in continuing attention to her nonmetastatic, persistent gestational trophoblastic disease, having had first cycle of actinomycin D on 12-02-11. She had continuous nausea x 5 days beginning as chemo completed, despite alternating phenergan and ativan 0.5 mg (did not use zofran due to history of frequent migraine headaches). She has had stomatitis/esophagitis symptoms for last 1-2 days. The new PAC functioned well for chemotherapy. She has had no nausea today, for first time since treatment.  Patient had some abnormality with first trimester pregnancy in 2011, with termination of that pregnancy. 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 She had HCG of 21,067 on 09-27-11, then office D&C 09-30-11. She has been on oral BCP since August. HCG decreased to 339 at one week then continued to decline down to 82 on Sept 13. Subsequently the marker rose for 3 weeks, at 133,142 and 143 (11-11-11). She was seen by Dr Yolande Jolly on 11-23-11 and had CT CAP in  system on 11-25-11 which showed no evidence of metastatic disease. Dr Yolande Jolly has recommended every other week actinomycin D at dose of 1.25 mg/m2 based on GOG information that this may be more effective than methotrexate. She had PAC by IR prior to starting the actinomycin D. Quantitative HCG sent from this office on 11-30-11 was 45.9 and has been repeated today with discrepancy of value compared with 11-11-11.   Patient has had no further bleeding or spotting. She has been able to drink fluids and has been eating. She notices some reflux Bowels have moved ~ every other day. She has not had actual vomiting. She had generalized aching "like the flu" x 2 days, no fever. She has not had migraine HA in past week.  She denies shortness of breath. She had LLQ abdominal cramping just after chemo which has resolved. PAC functioned well. She had some right posterior shoulder discomfort after that placement, now resolved; peripheral veins are very difficult to access even by experienced phlebotomists in our lab, and she prefers blood draws from [facility]. No bladder symptoms. Remainder of 10 point Review of Systems negative.  Objective:  Vital signs in last 24 hours:  BP 116/72  Pulse 90  Temp 97.5 F (36.4 C) (Oral)  Resp 20  Ht 5\' 2"  (1.575 m)  Wt 123 lb 9.6 oz (56.065 kg)  BMI 22.61 kg/m2  LMP 11/09/2011 Weight is down not quite one lb. Alert, easily mobile, NAD, respirations not labored RA. Husband very supportive.  HEENT:PERRL, not icteric. Oral mucosa clear. No thrush, but patchy erythema without exudate on soft palate and posterior pharynx. No alopecia LymphaticsCervical, supraclavicular, and axillary nodes normal. Resp: clear to auscultation bilaterally and normal percussion bilaterally Cardio: regular rate and rhythm GI: soft, non-tender; bowel sounds normal; no masses,  no organomegaly Not tender epigastrium. Extremities: extremities normal, atraumatic, no cyanosis or edema Neuro:no sensory deficits noted, otherwise nonfocal Skin without rash or petechiae Portacath -without erythema or tenderness  Lab Results:  Results for orders placed in visit on 12/07/11  CBC WITH DIFFERENTIAL      Component Value Range   WBC 4.1  3.9 - 10.3 10e3/uL   NEUT# 2.5  1.5 - 6.5 10e3/uL   HGB 12.5  11.6 - 15.9 g/dL   HCT 45.4  09.8 - 11.9 %   Platelets 135 (*) 145 -  400 10e3/uL   MCV 89.1  79.5 - 101.0 fL   MCH 29.4  25.1 - 34.0 pg   MCHC 33.0  31.5 - 36.0 g/dL   RBC 2.13  0.86 - 5.78 10e6/uL   RDW 13.4  11.2 - 14.5 %   lymph# 1.2  0.9 - 3.3 10e3/uL   MONO# 0.2  0.1 - 0.9 10e3/uL   Eosinophils Absolute 0.1  0.0 - 0.5 10e3/uL   Basophils Absolute 0.0  0.0 - 0.1 10e3/uL   NEUT% 62.0  38.4 - 76.8 %    LYMPH% 30.7  14.0 - 49.7 %   MONO% 4.2  0.0 - 14.0 %   EOS% 2.4  0.0 - 7.0 %   BASO% 0.7  0.0 - 2.0 %  COMPREHENSIVE METABOLIC PANEL (CC13)      Component Value Range   Sodium 138  136 - 145 mEq/L   Potassium 4.0  3.5 - 5.1 mEq/L   Chloride 110 (*) 98 - 107 mEq/L   CO2 23  22 - 29 mEq/L   Glucose 104 (*) 70 - 99 mg/dl   BUN 9.0  7.0 - 46.9 mg/dL   Creatinine 0.8  0.6 - 1.1 mg/dL   Total Bilirubin 6.29  0.20 - 1.20 mg/dL   Alkaline Phosphatase 34 (*) 40 - 150 U/L   AST 13  5 - 34 U/L   ALT 8  0 - 55 U/L   Total Protein 7.3  6.4 - 8.3 g/dL   Albumin 3.9  3.5 - 5.0 g/dL   Calcium 9.5  8.4 - 52.8 mg/dL  HCG, QUANTITATIVE, PREGNANCY      Component Value Range   hCG, Beta Chain, Quant, S 43.6       Studies/Results:  No results found.  Medications: I have reviewed the patient's current medications. She can use ativan up to 1 mg every 4 hrs prn. We will try EMEND with next chemotherapy, which hopefully will give additional and longer nausea coverage. Will add protonix or equivalent 40 mg daily. She will use baking soda in water mouth rinse every1-2 hours while awake, prn MMW without steroids and carafate slurry 1 mg ac and hs.  Assessment/Plan: 1. Persistent, nonmetastatic GTD: quantitative HCG essentially stable today as compared with just prior to first actinomycin D, tho this is much lower than values at time of referral from gyn. Will recheck with MD visit 11-6, which likely will be better indicator of response to cycle 1 treatment. Per communication from Dr Yolande Jolly, received after visit today, he suggests one cycle beyond normal marker as long as the marker drops at good rate.  2. Mucositis: appears related to actinomycin D: interventions as above. She will let us know if symptoms do not improve, and RN also aware to follow up by phone this week. 3.PAC in 4.no flu shot yet. We may be able to do this at visit next week 5.2 year history of frequent and severe migraines:  continuing prophylactic medications. No zofran if this can be avoided.  Patient and husband had questions answered and were in agreement with above plan.   LIVESAY,LENNIS P, MD   12/07/2011, 7:18 PM

## 2011-12-07 NOTE — Patient Instructions (Addendum)
We will send prescriptions for mouth wash to your pharmacy - swish and swallow.  Also please use baking soda ~ 1 tsp in water to rinse mouth every couple of hours while awake.  Call if this is not better by Friday 11-1.  We will send in prescription for reflux to your pharmacy, to start today and use daily. This may help a little also with the nausea with next chemo  You can use 1 or 2 of the 0.5 mg lorazepam for nausea, so 2 tablets = 1 mg might be a better dose for nausea.  I will try EMEND (aprepitant) with next chemo, which may give better and longer coverage for nausea in addition to the other medicines.

## 2011-12-08 ENCOUNTER — Telehealth: Payer: Self-pay

## 2011-12-08 NOTE — Telephone Encounter (Signed)
Message copied by Lorine Bears on Thu Dec 08, 2011  1:32 PM ------      Message from: Jama Flavors P      Created: Thu Dec 08, 2011 11:26 AM       Labs seen and need follow up: please let her know HCG from 12-07-11 was 43.6, so in same range as we had last week at [facility]. It is probably still too soon to see much difference from the first cycle of chemo, and value next week will be helpful.

## 2011-12-08 NOTE — Telephone Encounter (Signed)
Discussed HCG level with patient as noted below by Dr. Darrold Span.

## 2011-12-09 ENCOUNTER — Telehealth: Payer: Self-pay

## 2011-12-09 NOTE — Telephone Encounter (Signed)
Ms. Shayne stated that her mouth was 50% better and her heart burn was gone.  The carafate slurry was really helping. Ms. Morrisette was to have an old filling replace on Monday 12-12-11.  Told her that she should not have this done being on chemotherapy.  She will cancell the appt. And discuss appropriate timing with Dr. Darrold Span at next office visit.  Ms. Dutchover is not having any issues with the tooth the filling is just old and is due for replacement.

## 2011-12-11 ENCOUNTER — Other Ambulatory Visit: Payer: Self-pay | Admitting: Oncology

## 2011-12-14 ENCOUNTER — Other Ambulatory Visit: Payer: BC Managed Care – PPO

## 2011-12-14 ENCOUNTER — Encounter: Payer: Self-pay | Admitting: Oncology

## 2011-12-14 ENCOUNTER — Telehealth: Payer: Self-pay | Admitting: Oncology

## 2011-12-14 ENCOUNTER — Ambulatory Visit (HOSPITAL_BASED_OUTPATIENT_CLINIC_OR_DEPARTMENT_OTHER): Payer: BC Managed Care – PPO | Admitting: Oncology

## 2011-12-14 ENCOUNTER — Other Ambulatory Visit (HOSPITAL_BASED_OUTPATIENT_CLINIC_OR_DEPARTMENT_OTHER): Payer: BC Managed Care – PPO | Admitting: Lab

## 2011-12-14 VITALS — BP 129/72 | HR 62 | Temp 97.4°F | Resp 20 | Ht 62.0 in | Wt 123.4 lb

## 2011-12-14 DIAGNOSIS — G43909 Migraine, unspecified, not intractable, without status migrainosus: Secondary | ICD-10-CM

## 2011-12-14 DIAGNOSIS — O019 Hydatidiform mole, unspecified: Secondary | ICD-10-CM

## 2011-12-14 LAB — CBC WITH DIFFERENTIAL/PLATELET
BASO%: 0.5 % (ref 0.0–2.0)
Basophils Absolute: 0 10*3/uL (ref 0.0–0.1)
EOS%: 2.3 % (ref 0.0–7.0)
Eosinophils Absolute: 0.1 10*3/uL (ref 0.0–0.5)
HCT: 36.6 % (ref 34.8–46.6)
HGB: 12 g/dL (ref 11.6–15.9)
LYMPH%: 32.2 % (ref 14.0–49.7)
MCH: 29.1 pg (ref 25.1–34.0)
MCHC: 32.9 g/dL (ref 31.5–36.0)
MCV: 88.5 fL (ref 79.5–101.0)
MONO#: 0.5 10*3/uL (ref 0.1–0.9)
MONO%: 9.7 % (ref 0.0–14.0)
NEUT#: 2.6 10*3/uL (ref 1.5–6.5)
NEUT%: 55.3 % (ref 38.4–76.8)
Platelets: 143 10*3/uL — ABNORMAL LOW (ref 145–400)
RBC: 4.13 10*6/uL (ref 3.70–5.45)
RDW: 13.7 % (ref 11.2–14.5)
WBC: 4.8 10*3/uL (ref 3.9–10.3)
lymph#: 1.5 10*3/uL (ref 0.9–3.3)

## 2011-12-14 LAB — COMPREHENSIVE METABOLIC PANEL (CC13)
ALT: 10 U/L (ref 0–55)
AST: 14 U/L (ref 5–34)
Albumin: 4 g/dL (ref 3.5–5.0)
Alkaline Phosphatase: 40 U/L (ref 40–150)
BUN: 10 mg/dL (ref 7.0–26.0)
CO2: 21 mEq/L — ABNORMAL LOW (ref 22–29)
Calcium: 9.4 mg/dL (ref 8.4–10.4)
Chloride: 114 mEq/L — ABNORMAL HIGH (ref 98–107)
Creatinine: 0.9 mg/dL (ref 0.6–1.1)
Glucose: 76 mg/dl (ref 70–99)
Potassium: 3.4 mEq/L — ABNORMAL LOW (ref 3.5–5.1)
Sodium: 141 mEq/L (ref 136–145)
Total Bilirubin: 0.58 mg/dL (ref 0.20–1.20)
Total Protein: 7.6 g/dL (ref 6.4–8.3)

## 2011-12-14 LAB — HCG, QUANTITATIVE, PREGNANCY: hCG, Beta Chain, Quant, S: 11.5 m[IU]/mL

## 2011-12-14 NOTE — Patient Instructions (Signed)
Senokot S 1-2 daily as needed for constipation  Ativan 0.5 mg   1-2 every 4-6 hours as needed for nausea.  Fine to use magic mouthwash and/ or carafate if sore mouth or reflux

## 2011-12-14 NOTE — Telephone Encounter (Signed)
Gave pt appt for lab and MD on 11/13th and 11/20th, emailed Kaitlin Ferrell regarding chemo

## 2011-12-14 NOTE — Progress Notes (Signed)
OFFICE PROGRESS NOTE   12/14/2011   Physicians: D.ClarkePearson; Angelica Chessman., MD, Ernestina Penna K,MD   INTERVAL HISTORY:  Patient is seen, together with husband, in continuing attention to her persistent, nonmetastatic GTD, having had one cycle of actinomycin D on 12-02-11; she is due cycle 2 on 12-16-11. Quantitative HCG was 45.9 on 11-30-11, 43.6 on 12-07-11 and is pending today (these values lower than what she had on referral to gyn oncology).  Patient had some abnormality with first trimester pregnancy in 2011, with termination of that pregnancy. 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 She had HCG of 21,067 on 09-27-11, then office D&C 09-30-11. She has been on oral BCP since August. HCG decreased to 339 at one week then continued to decline down to 82 on Sept 13. Subsequently the marker rose for 3 weeks, at 133,142 and 143 (11-11-11). She was seen by Dr Yolande Jolly on 11-23-11 and had CT CAP in [place] system on 11-25-11 which showed no evidence of metastatic disease. Dr Yolande Jolly has recommended every other week actinomycin D at dose of 1.25 mg/m2 based on GOG information that this may be more effective than methotrexate. She had PAC by IR prior to starting the actinomycin D. Quantitative HCG sent from this office on 11-30-11 was 45.9 and was repeated 10-30-113 as above with discrepancy of value compared with 11-11-11.   Patient has felt very well this week. The oral mucositis resolved promptly with MMW and carafate was helpful for reflux; she has not needed either of these in past several days. Energy has been good and she has had no fever or symptoms of infection. She has had no nausea this week. She had "normal menstrual period" beginning 12-09-11, essentially completed now; she did not have heavy bleeding or cramping. Appetite has been good now. She is able to sleep. She is starting to lose hair and has gotten  information about wigs and [employer] program. She has had no significant migraine HA this week, still on prophylactic imitrex and topamax. Remainder of 10 point Review of Systems negative.  Per my communication with Dr Yolande Jolly, she should have actinomycin D one cycle beyond normal marker as long as marker normalizes in acceptable timeframe. Objective:  Vital signs in last 24 hours:  BP 129/72  Pulse 62  Temp 97.4 F (36.3 C) (Oral)  Resp 20  Ht 5\' 2"  (1.575 m)  Wt 123 lb 6.4 oz (55.974 kg)  BMI 22.57 kg/m2  LMP 11/09/2011 Weight is stable. Easily ambulatory, looks comfortable and a little more relaxed today.    HEENT:PERRLA, sclera clear, anicteric and oropharynx clear, no lesions. Hair thinning. Posterior pharynx with dull erythema consistent with post nasal drainage, mucositis entirely resolved. LymphaticsCervical, supraclavicular, and axillary nodes normal. Resp: clear to auscultation bilaterally and normal percussion bilaterally Cardio: regular rate and rhythm GI: soft, non-tender; bowel sounds normal; no masses,  no organomegaly. Not tender in epigastrium. Extremities: extremities normal, atraumatic, no cyanosis or edema Neuro:nonfocal, unchanged Skin without rash or ecchymosis Portacath without erythema or tenderness right anterior chest.  Lab Results:  Results for orders placed in visit on 12/14/11  CBC WITH DIFFERENTIAL      Component Value Range   WBC 4.8  3.9 - 10.3 10e3/uL   NEUT# 2.6  1.5 - 6.5 10e3/uL   HGB 12.0  11.6 - 15.9 g/dL   HCT 16.1  09.6 - 04.5 %   Platelets 143 (*) 145 - 400 10e3/uL  MCV 88.5  79.5 - 101.0 fL   MCH 29.1  25.1 - 34.0 pg   MCHC 32.9  31.5 - 36.0 g/dL   RBC 4.09  8.11 - 9.14 10e6/uL   RDW 13.7  11.2 - 14.5 %   lymph# 1.5  0.9 - 3.3 10e3/uL   MONO# 0.5  0.1 - 0.9 10e3/uL   Eosinophils Absolute 0.1  0.0 - 0.5 10e3/uL   Basophils Absolute 0.0  0.0 - 0.1 10e3/uL   NEUT% 55.3  38.4 - 76.8 %   LYMPH% 32.2  14.0 - 49.7 %     MONO% 9.7  0.0 - 14.0 %   EOS% 2.3  0.0 - 7.0 %   BASO% 0.5  0.0 - 2.0 %    CMET available after visit normal with exception of K+ 3.4, chloride 114 and CO2 21  Quantitative HCG sent and pending. Studies/Results:  No results found.  Medications: I have reviewed the patient's current medications. We will use EMEND with cycle 2 actinomycin D, as well as oral ice around the chemo infusion itself. She will use prn compazine and 1 mg ativan after treatment.   Assessment/Plan: 1.Persistent nonmetastatic GTD: for cycle 2 actinomycin D on 12-16-11, on every other week schedule. Will follow up HCG pending. I will see her 12-21-11 and 12-28-11 with labs both visits. 2.PAC in 3.history of frequent migraine headaches preceding this diagnosis, such that I prefer to avoid zofran if possible. 4. I do not believe that she has had flu shot yet.    LIVESAY,LENNIS P, MD   12/14/2011, 10:35 AM

## 2011-12-14 NOTE — Telephone Encounter (Signed)
MD spoke with patient now, to let her know that HCG is still pending so that we will have to let her know that result tomorrow; K+ is just a little low and she will increase K+ in diet now. Patient appreciated call. Ila Mcgill, MD

## 2011-12-15 ENCOUNTER — Telehealth: Payer: Self-pay | Admitting: *Deleted

## 2011-12-15 ENCOUNTER — Other Ambulatory Visit: Payer: Self-pay | Admitting: Oncology

## 2011-12-15 NOTE — Telephone Encounter (Signed)
Message copied by Phillis Knack on Thu Dec 15, 2011  9:58 AM ------      Message from: Jama Flavors P      Created: Thu Dec 15, 2011  8:02 AM       Labs seen and need follow up: please let her know HCG is down to 11.5, which is an excellent drop. Normal is <5 and we will treat one cycle beyond normal, so hopefully that will be the treatment this week + one more.

## 2011-12-15 NOTE — Telephone Encounter (Signed)
Notified patient of results per Dr Darrold Span note below. Pt verbalized understanding.

## 2011-12-16 ENCOUNTER — Ambulatory Visit (HOSPITAL_BASED_OUTPATIENT_CLINIC_OR_DEPARTMENT_OTHER): Payer: BC Managed Care – PPO

## 2011-12-16 ENCOUNTER — Telehealth: Payer: Self-pay | Admitting: *Deleted

## 2011-12-16 VITALS — BP 116/76 | HR 65 | Temp 97.4°F | Resp 20

## 2011-12-16 DIAGNOSIS — O019 Hydatidiform mole, unspecified: Secondary | ICD-10-CM

## 2011-12-16 DIAGNOSIS — Z5111 Encounter for antineoplastic chemotherapy: Secondary | ICD-10-CM

## 2011-12-16 MED ORDER — PROCHLORPERAZINE EDISYLATE 5 MG/ML IJ SOLN
10.0000 mg | Freq: Once | INTRAMUSCULAR | Status: AC
  Administered 2011-12-16: 10 mg via INTRAVENOUS

## 2011-12-16 MED ORDER — HEPARIN SOD (PORK) LOCK FLUSH 100 UNIT/ML IV SOLN
500.0000 [IU] | Freq: Once | INTRAVENOUS | Status: AC | PRN
  Administered 2011-12-16: 500 [IU]
  Filled 2011-12-16: qty 5

## 2011-12-16 MED ORDER — DEXAMETHASONE SODIUM PHOSPHATE 10 MG/ML IJ SOLN
10.0000 mg | Freq: Once | INTRAMUSCULAR | Status: AC
  Administered 2011-12-16: 10 mg via INTRAVENOUS

## 2011-12-16 MED ORDER — SODIUM CHLORIDE 0.9 % IV SOLN
150.0000 mg | Freq: Once | INTRAVENOUS | Status: AC
  Administered 2011-12-16: 150 mg via INTRAVENOUS
  Filled 2011-12-16: qty 5

## 2011-12-16 MED ORDER — SODIUM CHLORIDE 0.9 % IV SOLN
Freq: Once | INTRAVENOUS | Status: AC
  Administered 2011-12-16: 14:00:00 via INTRAVENOUS

## 2011-12-16 MED ORDER — SODIUM CHLORIDE 0.9 % IV SOLN
1.2500 mg/m2 | Freq: Once | INTRAVENOUS | Status: AC
  Administered 2011-12-16: 1950 ug via INTRAVENOUS
  Filled 2011-12-16: qty 3.9

## 2011-12-16 MED ORDER — SODIUM CHLORIDE 0.9 % IJ SOLN
10.0000 mL | INTRAMUSCULAR | Status: DC | PRN
  Administered 2011-12-16: 10 mL
  Filled 2011-12-16: qty 10

## 2011-12-16 NOTE — Telephone Encounter (Deleted)
Message copied by Caren Griffins on Fri Dec 16, 2011  3:49 PM ------      Message from: Lorine Bears      Created: Fri Dec 16, 2011  3:36 PM                   ----- Message -----         From: Reece Packer, MD         Sent: 12/15/2011   8:02 AM           To: Lorine Bears, RN, Phillis Knack, RN            Labs seen and need follow up: please let her know HCG is down to 11.5, which is an excellent drop. Normal is <5 and we will treat one cycle beyond normal, so hopefully that will be the treatment this week + one more.

## 2011-12-16 NOTE — Patient Instructions (Addendum)
[facility] Discharge Instructions for Patients Receiving Chemotherapy  Today you received the following chemotherapy agents: dactinomycin  To help prevent nausea and vomiting after your treatment, we encourage you to take your nausea medication. Take it as often as prescribed.     If you develop nausea and vomiting that is not controlled by your nausea medication, call the clinic. If it is after clinic hours your family physician or the after hours number for the clinic or go to the Emergency Department.   BELOW ARE SYMPTOMS THAT SHOULD BE REPORTED IMMEDIATELY:  *FEVER GREATER THAN 100.5 F  *CHILLS WITH OR WITHOUT FEVER  NAUSEA AND VOMITING THAT IS NOT CONTROLLED WITH YOUR NAUSEA MEDICATION  *UNUSUAL SHORTNESS OF BREATH  *UNUSUAL BRUISING OR BLEEDING  TENDERNESS IN MOUTH AND THROAT WITH OR WITHOUT PRESENCE OF ULCERS  *URINARY PROBLEMS  *BOWEL PROBLEMS  UNUSUAL RASH Items with * indicate a potential emergency and should be followed up as soon as possible.  Feel free to call the clinic you have any questions or concerns. The clinic phone number is [phone].   I have been informed and understand all the instructions given to me. I know to contact the clinic, my physician, or go to the Emergency Department if any problems should occur. I do not have any questions at this time, but understand that I may call the clinic during office hours   should I have any questions or need assistance in obtaining follow up care.    __________________________________________  _____________  __________ Signature of Patient or Authorized Representative            Date                   Time    __________________________________________ Nurse's Signature     Stomatitis Stomatitis is an inflammation of the mucous lining of the mouth. It can affect part of the mouth or the whole mouth. The intensity of symptoms can range from mild to severe. It can affect your cheek,  teeth, gums, lips, or tongue. In almost all cases, the lining of the mouth becomes swollen, red, and painful. Painful ulcers can develop in your mouth. Stomatitis recurs in some people. CAUSES  There are many common causes of stomatitis. They include:  Viruses (such as cold sores or shingles).  Canker sores.  Bacteria (such as ulcerative gingivitis or sexually transmitted diseases).  Fungus or yeast (such as candidiasis or oral thrush).  Poor oral hygiene and poor nutrition (Vincent's stomatitis or trench mouth).  Lack of vitamin B, vitamin C, or niacin.  Dentures or braces that do not fit properly.  High acid foods (uncommon).  Sharp or broken teeth.  Cheek biting.  Breathing through the mouth.  Chewing tobacco.  Allergy to toothpaste, mouthwash, candy, gum, lipstick, or some medicines.  Burning your mouth with hot drinks or food.  Exposure to dyes, heavy metals, acid fumes, or mineral dust. SYMPTOMS   Painful ulcers in the mouth.  Blisters in the mouth.  Bleeding gums.  Swollen gums.  Irritability.  Bad breath.  Bad taste in the mouth.  Fever.  Trouble eating because of burning and pain in the mouth. DIAGNOSIS  Your caregiver will examine your mouth and look for bleeding gums and mouth ulcers. Your caregiver may ask you about the medicines you are taking. Your caregiver may suggest a blood test and tissue sample (biopsy) of the mouth ulcer or mass if either is present. This will help find  the cause of your condition. TREATMENT  Your treatment will depend on the cause of your condition. Your caregiver will first try to treat your symptoms.   You may be given pain medicine. Topical anesthetic may be used to numb the area if you have severe pain.  Your caregiver may prescribe antibiotic medicine if you have a bacterial infection.  Your caregiver may prescribe antifungal medicine if you have a fungal infection.  You may need to take antiviral medicine if  you have a viral infection like herpes.  You may be asked to use medicated mouth rinses.  Your caregiver will advise you about proper brushing and using a soft toothbrush. You also need to get your teeth cleaned regularly. HOME CARE INSTRUCTIONS   Maintain good oral hygiene. This is especially important for transplant patients.  Brush your teeth carefully with a soft, nylon-bristled toothbrush.  Floss at least 2 times a day.  Clean your mouth after eating.  Rinse your mouth with salt water 3 to 4 times a day.  Gargle with cold water.  Use topical numbing medicines to decrease pain if recommended by your caregiver.  Stop smoking, and stop using chewing or smokeless tobacco.  Avoid eating hot and spicy foods.  Eat soft and bland food.  Reduce your stress wherever possible.  Eat healthy and nutritious foods. SEEK MEDICAL CARE IF:   Your symptoms persist or get worse.  You develop new symptoms.  Your mouth ulcers are present for more than 3 weeks.  Your mouth ulcers come back frequently.  You have increasing difficulty with normal eating and drinking.  You have increasing fatigue or weakness.  You develop loss of appetite or nausea. SEEK IMMEDIATE MEDICAL CARE IF:   You have a fever.  You develop pain, redness, or sores around one or both eyes.  You cannot eat or drink because of pain or other symptoms.  You develop worsening weakness, or you faint.  You develop vomiting or diarrhea.  You develop chest pain, shortness of breath, or rapid and irregular heartbeats. MAKE SURE YOU:  Understand these instructions.  Will watch your condition.  Will get help right away if you are not doing well or get worse. Document Released: 11/21/2006 Document Revised: 04/18/2011 Document Reviewed: 09/02/2010 [facility] Patient Information 2013 [place], [place].

## 2011-12-16 NOTE — Progress Notes (Signed)
Ice provided to patient 15 minutes prior to, after and during Dactinomycin infusion.

## 2011-12-16 NOTE — Telephone Encounter (Signed)
error 

## 2011-12-17 ENCOUNTER — Other Ambulatory Visit: Payer: Self-pay | Admitting: Oncology

## 2011-12-21 ENCOUNTER — Encounter: Payer: Self-pay | Admitting: Oncology

## 2011-12-21 ENCOUNTER — Other Ambulatory Visit (HOSPITAL_BASED_OUTPATIENT_CLINIC_OR_DEPARTMENT_OTHER): Payer: BC Managed Care – PPO | Admitting: Lab

## 2011-12-21 ENCOUNTER — Ambulatory Visit (HOSPITAL_BASED_OUTPATIENT_CLINIC_OR_DEPARTMENT_OTHER): Payer: BC Managed Care – PPO | Admitting: Oncology

## 2011-12-21 ENCOUNTER — Telehealth: Payer: Self-pay | Admitting: *Deleted

## 2011-12-21 ENCOUNTER — Telehealth: Payer: Self-pay | Admitting: Oncology

## 2011-12-21 VITALS — BP 128/66 | HR 54 | Temp 97.4°F | Resp 20 | Ht 62.0 in | Wt 122.7 lb

## 2011-12-21 DIAGNOSIS — O019 Hydatidiform mole, unspecified: Secondary | ICD-10-CM

## 2011-12-21 LAB — CBC WITH DIFFERENTIAL/PLATELET
BASO%: 0.6 % (ref 0.0–2.0)
Basophils Absolute: 0 10*3/uL (ref 0.0–0.1)
EOS%: 1.2 % (ref 0.0–7.0)
Eosinophils Absolute: 0.1 10*3/uL (ref 0.0–0.5)
HCT: 36.6 % (ref 34.8–46.6)
HGB: 12.2 g/dL (ref 11.6–15.9)
LYMPH%: 46.6 % (ref 14.0–49.7)
MCH: 29.6 pg (ref 25.1–34.0)
MCHC: 33.4 g/dL (ref 31.5–36.0)
MCV: 88.6 fL (ref 79.5–101.0)
MONO#: 0.3 10*3/uL (ref 0.1–0.9)
MONO%: 6.1 % (ref 0.0–14.0)
NEUT#: 2 10*3/uL (ref 1.5–6.5)
NEUT%: 45.5 % (ref 38.4–76.8)
Platelets: 151 10*3/uL (ref 145–400)
RBC: 4.13 10*6/uL (ref 3.70–5.45)
RDW: 13.4 % (ref 11.2–14.5)
WBC: 4.3 10*3/uL (ref 3.9–10.3)
lymph#: 2 10*3/uL (ref 0.9–3.3)

## 2011-12-21 LAB — HCG, QUANTITATIVE, PREGNANCY: hCG, Beta Chain, Quant, S: 14.1 m[IU]/mL

## 2011-12-21 NOTE — Progress Notes (Signed)
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 She had cycle 2 actinomycin D on 12-16-11. Nausea was well controlled with addition of EMEND to decadron and compazine, and she has needed antiemetics at home only ~ 2 times for minimal nausea since then. She did have bothersome anxiety/restlessness later on day of treatment, which lasted overnight; she may have had similar symptoms with cycle 1 also. She has had no stomatitis since using oral ice around second actinomycin D administration.  Patient had some abnormality with first trimester pregnancy in 2011, with termination of that pregnancy. 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 She had HCG of 21,067 on 09-27-11, then office D&C 09-30-11. She has been on oral BCP since August. HCG decreased to 339 at one week then continued to decline down to 82 on Sept 13. Subsequently the marker rose for 3 weeks, at 133,142 and 143 (11-11-11). She was seen by Dr Yolande Jolly on 11-23-11 and had CT CAP in  system on 11-25-11 which showed no evidence of metastatic disease. Dr Yolande Jolly has recommended every other week actinomycin D at dose of 1.25 mg/m2 based on GOG information that this may be more effective than methotrexate. She had PAC by IR prior to starting the actinomycin D. Quantitative HCG sent from this office on 11-30-11 was 45.9 and was repeated 12-07-11 at 43.6.  She had first actinomycin D on 12-02-11, with HCG on 12-14-11 down to 11.5. First cycle of chemo was complicated by significant nausea and mild stomatitis. Recommendation is for one cycle of actinomycin D beyond normalization of marker as long as that occurs fairly promptly.  Patient  started normal menses on Nov 1, which was on time. Periods usually last 5 days, however she is still have minimal brownish spotting now day13. She has no abdominal or pelvic discomfort or cramping. Bowels have been moving well. She does not recall any "very bad" migraine HAs since starting chemo. She has had no other bleeding, no SOB, no fever or symptoms of infection. Hands were cold this am with fingers a little numb, better now. Remainder of 10 point Review of Systems negative.  Objective:  Vital signs in last 24 hours:  BP 128/66  Pulse 54  Temp 97.4 F (36.3 C) (Oral)  Resp 20  Ht 5\' 2"  (1.575 m)  Wt 122 lb 11.2 oz (55.656 kg)  BMI 22.44 kg/m2  LMP 11/09/2011 Weight is down ~ 0.5 lb. Easily mobile, respirations not labored RA, appears comfortable. Wearing cap, but no complete alopecia yet.   HEENT:PERRLA, sclera clear, anicteric and oropharynx clear, no lesions LymphaticsCervical, supraclavicular, and axillary nodes normal. Resp: clear to auscultation bilaterally and normal percussion bilaterally Cardio: regular rate and rhythm GI: soft, non-tender; bowel sounds normal; no masses,  no organomegaly Extremities: extremities normal, atraumatic, no cyanosis or edema Neuro:no sensory deficits noted PAC site fine. Skin without rash or ecchymosis  Lab Results:  Results for orders placed in visit on 12/21/11  CBC WITH DIFFERENTIAL      Component Value Range   WBC 4.3  3.9 - 10.3 10e3/uL   NEUT# 2.0  1.5 - 6.5 10e3/uL   HGB 12.2  11.6 - 15.9 g/dL   HCT 96.0  45.4 - 09.8 %   Platelets 151  145 -  400 10e3/uL   MCV 88.6  79.5 - 101.0 fL   MCH 29.6  25.1 - 34.0 pg   MCHC 33.4  31.5 - 36.0 g/dL   RBC 0.98  1.19 - 1.47 10e6/uL   RDW 13.4  11.2 - 14.5 %   lymph# 2.0  0.9 - 3.3 10e3/uL   MONO# 0.3  0.1 - 0.9 10e3/uL   Eosinophils Absolute 0.1  0.0 - 0.5 10e3/uL   Basophils Absolute 0.0  0.0 - 0.1 10e3/uL   NEUT% 45.5  38.4 - 76.8 %   LYMPH% 46.6  14.0 - 49.7 %   MONO% 6.1  0.0 -  14.0 %   EOS% 1.2  0.0 - 7.0 %   BASO% 0.6  0.0 - 2.0 %    Quantitative HCG pending today.  Studies/Results:  No results found.  Medications: I have reviewed the patient's current medications. She does not want flu vaccine, particularly as hopefully she will not be on chemotherapy during active flu season.  Assessment/Plan: 1. Persistent nonmetastatic GTD: continuing treatment with every other week actinomycin D as above, due again 11-22-113. I will see her with labs 12-28-11. 2. Probable intolerance to compazine, as most likely cause of anxiety/ restlessness with chemo to date (tho could also be related to decadron). Have discussed with [facility] pharmacist and will use EMEND, decadron and phenergan with next cycle. 3.PAC in 4. History of migraine HA such that we have not used zofran   Patient and husband were comfortable with discussion and plan above.  Ewin Rehberg P, MD   12/21/2011, 11:43 AM

## 2011-12-21 NOTE — Telephone Encounter (Signed)
Gave pt appt for November 2013 lab, MD and chemo °

## 2011-12-21 NOTE — Patient Instructions (Signed)
Anxiety/agitation after chemo is probably from the compazine used for nausea; decadron also used for nausea can sometimes add to anxiety, but the compazine is most likely.  We will not use compazine with next chemo, will probably use phenergan instead.  Phenergan and ativan do not cause agitation.

## 2011-12-21 NOTE — Telephone Encounter (Signed)
Per staff phone call and POF I have scheduled appts. [name]  

## 2011-12-22 ENCOUNTER — Other Ambulatory Visit: Payer: Self-pay | Admitting: Oncology

## 2011-12-23 ENCOUNTER — Telehealth: Payer: Self-pay

## 2011-12-23 ENCOUNTER — Telehealth: Payer: Self-pay | Admitting: Oncology

## 2011-12-23 NOTE — Telephone Encounter (Signed)
Message copied by Lorine Bears on Fri Dec 23, 2011  1:48 PM ------      Message from: Jama Flavors P      Created: Thu Dec 22, 2011  8:54 PM       Labs seen and need follow up: please let patient know marker was 14.1 on 12-21-11. We will recheck at visit 11-20 as scheduled. RN also please give copy of this result with priors to gyn onc.

## 2011-12-23 NOTE — Telephone Encounter (Signed)
Patient LM on gyn oncology line at 1659 re HA even with migraine medications. This MD notified and returned call to patient now. She has continued topamax as she takes daily and had had 2 doses imitrex (= max 200 mg) today, still has what seems to be her typical migraine HA. Platelets were good at 151 on 02-20-11 so ok to take advil now.  Ila Mcgill, MD

## 2011-12-23 NOTE — Telephone Encounter (Signed)
Told patient that the HCG level as noted below by Dr. Darrold Span.

## 2011-12-24 ENCOUNTER — Other Ambulatory Visit: Payer: Self-pay | Admitting: Oncology

## 2011-12-28 ENCOUNTER — Other Ambulatory Visit (HOSPITAL_BASED_OUTPATIENT_CLINIC_OR_DEPARTMENT_OTHER): Payer: BC Managed Care – PPO | Admitting: Lab

## 2011-12-28 ENCOUNTER — Telehealth: Payer: Self-pay | Admitting: Oncology

## 2011-12-28 ENCOUNTER — Encounter: Payer: Self-pay | Admitting: Oncology

## 2011-12-28 ENCOUNTER — Ambulatory Visit (HOSPITAL_BASED_OUTPATIENT_CLINIC_OR_DEPARTMENT_OTHER): Payer: BC Managed Care – PPO | Admitting: Oncology

## 2011-12-28 VITALS — BP 115/66 | HR 79 | Temp 97.6°F | Resp 20 | Ht 62.0 in | Wt 120.9 lb

## 2011-12-28 DIAGNOSIS — O019 Hydatidiform mole, unspecified: Secondary | ICD-10-CM

## 2011-12-28 DIAGNOSIS — R51 Headache: Secondary | ICD-10-CM

## 2011-12-28 LAB — COMPREHENSIVE METABOLIC PANEL (CC13)
ALT: 11 U/L (ref 0–55)
AST: 14 U/L (ref 5–34)
Albumin: 4.1 g/dL (ref 3.5–5.0)
Alkaline Phosphatase: 36 U/L — ABNORMAL LOW (ref 40–150)
BUN: 15 mg/dL (ref 7.0–26.0)
CO2: 20 mEq/L — ABNORMAL LOW (ref 22–29)
Calcium: 9.5 mg/dL (ref 8.4–10.4)
Chloride: 113 mEq/L — ABNORMAL HIGH (ref 98–107)
Creatinine: 0.9 mg/dL (ref 0.6–1.1)
Glucose: 89 mg/dl (ref 70–99)
Potassium: 3.6 mEq/L (ref 3.5–5.1)
Sodium: 140 mEq/L (ref 136–145)
Total Bilirubin: 0.65 mg/dL (ref 0.20–1.20)
Total Protein: 7.8 g/dL (ref 6.4–8.3)

## 2011-12-28 LAB — CBC WITH DIFFERENTIAL/PLATELET
BASO%: 0.4 % (ref 0.0–2.0)
Basophils Absolute: 0 10*3/uL (ref 0.0–0.1)
EOS%: 2.6 % (ref 0.0–7.0)
Eosinophils Absolute: 0.1 10*3/uL (ref 0.0–0.5)
HCT: 37.1 % (ref 34.8–46.6)
HGB: 12.3 g/dL (ref 11.6–15.9)
LYMPH%: 32 % (ref 14.0–49.7)
MCH: 28.5 pg (ref 25.1–34.0)
MCHC: 33.2 g/dL (ref 31.5–36.0)
MCV: 86.1 fL (ref 79.5–101.0)
MONO#: 0.4 10*3/uL (ref 0.1–0.9)
MONO%: 8.8 % (ref 0.0–14.0)
NEUT#: 2.6 10*3/uL (ref 1.5–6.5)
NEUT%: 56.2 % (ref 38.4–76.8)
Platelets: 108 10*3/uL — ABNORMAL LOW (ref 145–400)
RBC: 4.31 10*6/uL (ref 3.70–5.45)
RDW: 13.5 % (ref 11.2–14.5)
WBC: 4.6 10*3/uL (ref 3.9–10.3)
lymph#: 1.5 10*3/uL (ref 0.9–3.3)

## 2011-12-28 LAB — HCG, QUANTITATIVE, PREGNANCY: hCG, Beta Chain, Quant, S: <2 m[IU]/mL

## 2011-12-28 NOTE — Telephone Encounter (Signed)
gv and printed appt schedule for pt for NOV and Dec..the patient aware

## 2011-12-28 NOTE — Patient Instructions (Signed)
Appointments as scheduled. 

## 2011-12-28 NOTE — Progress Notes (Signed)
OFFICE PROGRESS NOTE   12/28/2011   Physicians:D.ClarkePearson; Angelica Chessman., MD, Ernestina Penna K,MD   INTERVAL HISTORY:  Patient is seen, together with husband, in continuing attention to her persistent nonmetastatic GTD, due cycle 3 actinomycin D on 12-30-11. The actinomycin D has been administered on 12-02-11 and 12-16-11. HCG had been 45 prior to cycle 1, dropped to 11.5 prior to cycle 2, but was 14 when checked last week (gyn oncology aware). HCG is pending again today. Plan is for one cycle beyond normal if marker responds quickly.  Patient had some abnormality with first trimester pregnancy in 2011, with termination of that pregnancy. 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 She had HCG of 21,067 on 09-27-11, then office D&C 09-30-11. She has been on oral BCP since August. HCG decreased to 339 at one week then continued to decline down to 82 on Sept 13. Subsequently the marker rose for 3 weeks, at 133,142 and 143 (11-11-11). She was seen by Dr Yolande Jolly on 11-23-11 and had CT CAP in [facility] system on 11-25-11 which showed no evidence of metastatic disease. Dr Yolande Jolly has recommended every other week actinomycin D at dose of 1.25 mg/m2 based on GOG information that this may be more effective than methotrexate. She had PAC by IR prior to starting the actinomycin D. Quantitative HCG sent from this office on 11-30-11 was 45.9 and was repeated 12-07-11 at 43.6. She had first actinomycin D on 12-02-11, with HCG on 12-14-11 down to 11.5. First cycle of chemo was complicated by significant nausea and mild stomatitis;nausea was better cycle 2 with addition of EMEND and she had no stomatitis using ice chips po during chemo infusion. She was restless with compazine, which has been discontinued.   Patient has felt well other than intermittent headaches this week, these chronic and felt stress-related, much improved with  daily topamax and imitrex but still frequent, improved this week with occasional advil. She has no other neurologic symptoms and only minimal HA now. Appetite is good and she has needed no antiemetics this week. Light vaginal bleeding stopped on 12-23-11. Bowels are moving normally, PAC has functioned well, she is losing hair. She has had no fever or symptoms of infection. Remainder of 10 point Review of Systems negative.  Objective:  Vital signs in last 24 hours:  BP 115/66  Pulse 79  Temp 97.6 F (36.4 C) (Oral)  Resp 20  Ht 5\' 2"  (1.575 m)  Wt 120 lb 14.4 oz (54.84 kg)  BMI 22.11 kg/m2 Easily ambulatory, looks comfortable, respirations not labored RA.   HEENT:PERRLA, sclera clear, anicteric and oropharynx clear, no lesions LymphaticsCervical, supraclavicular, and axillary nodes normal. Resp: clear to auscultation bilaterally and normal percussion bilaterally Cardio: regular rate and rhythm GI: soft, non-tender; bowel sounds normal; no masses,  no organomegaly Extremities: extremities normal, atraumatic, no cyanosis or edema Neuro:no sensory deficits noted, CN, motor, sensory, cerebellar nonfocal Portacath-without erythema or tenderness Skin without rash or ecchymosis  Lab Results:  Results for orders placed in visit on 12/28/11  CBC WITH DIFFERENTIAL      Component Value Range   WBC 4.6  3.9 - 10.3 10e3/uL   NEUT# 2.6  1.5 - 6.5 10e3/uL   HGB 12.3  11.6 - 15.9 g/dL   HCT 16.1  09.6 - 04.5 %   Platelets 108 (*) 145 - 400 10e3/uL   MCV 86.1  79.5 - 101.0 fL   MCH 28.5  25.1 -  34.0 pg   MCHC 33.2  31.5 - 36.0 g/dL   RBC 4.09  8.11 - 9.14 10e6/uL   RDW 13.5  11.2 - 14.5 %   lymph# 1.5  0.9 - 3.3 10e3/uL   MONO# 0.4  0.1 - 0.9 10e3/uL   Eosinophils Absolute 0.1  0.0 - 0.5 10e3/uL   Basophils Absolute 0.0  0.0 - 0.1 10e3/uL   NEUT% 56.2  38.4 - 76.8 %   LYMPH% 32.0  14.0 - 49.7 %   MONO% 8.8  0.0 - 14.0 %   EOS% 2.6  0.0 - 7.0 %   BASO% 0.4  0.0 - 2.0 %  COMPREHENSIVE  METABOLIC PANEL (CC13)      Component Value Range   Sodium 140  136 - 145 mEq/L   Potassium 3.6  3.5 - 5.1 mEq/L   Chloride 113 (*) 98 - 107 mEq/L   CO2 20 (*) 22 - 29 mEq/L   Glucose 89  70 - 99 mg/dl   BUN 78.2  7.0 - 95.6 mg/dL   Creatinine 0.9  0.6 - 1.1 mg/dL   Total Bilirubin 2.13  0.20 - 1.20 mg/dL   Alkaline Phosphatase 36 (*) 40 - 150 U/L   AST 14  5 - 34 U/L   ALT 11  0 - 55 U/L   Total Protein 7.8  6.4 - 8.3 g/dL   Albumin 4.1  3.5 - 5.0 g/dL   Calcium 9.5  8.4 - 08.6 mg/dL    Quantitative HCG pending Studies/Results:  No results found.  Medications: I have reviewed the patient's current medications.  Assessment/Plan: 1. Persistent nonmetastatic GTD: for cycle 3 actinomycin D on 12-30-11, ok to treat by counts today. Will follow up HCG pending today and will continue to check this weekly, next on 01-04-12. I will see her back on 01-11-12 with CBC/CMET/HCG and she will be due another treatment on 01-13-12 if appropriate. She continues OCP. 2.chronic migraine headaches which seem at baseline 3.intolerance to compazine causing restlessness 4.PAC in  Patient and husband were in agreement with plan above.  Hovanes Hymas P, MD   12/28/2011, 11:00 AM

## 2011-12-29 ENCOUNTER — Telehealth: Payer: Self-pay

## 2011-12-29 NOTE — Telephone Encounter (Signed)
Message copied by Lorine Bears on Thu Dec 29, 2011 11:06 AM ------      Message from: Reece Packer      Created: Wed Dec 28, 2011  4:17 PM       Labs seen and need follow up: please let her know HCG is <2 !! Keep treatment 11-22 as scheduled

## 2011-12-29 NOTE — Telephone Encounter (Signed)
Told Kaitlin Ferrell the results  of HCG as noted below by Dr. Darrold Span.  Patient very excited.

## 2011-12-30 ENCOUNTER — Ambulatory Visit (HOSPITAL_BASED_OUTPATIENT_CLINIC_OR_DEPARTMENT_OTHER): Payer: BC Managed Care – PPO

## 2011-12-30 VITALS — BP 108/69 | HR 67 | Temp 97.1°F

## 2011-12-30 DIAGNOSIS — O019 Hydatidiform mole, unspecified: Secondary | ICD-10-CM

## 2011-12-30 DIAGNOSIS — Z5111 Encounter for antineoplastic chemotherapy: Secondary | ICD-10-CM

## 2011-12-30 MED ORDER — HEPARIN SOD (PORK) LOCK FLUSH 100 UNIT/ML IV SOLN
500.0000 [IU] | Freq: Once | INTRAVENOUS | Status: AC | PRN
  Administered 2011-12-30: 500 [IU]
  Filled 2011-12-30: qty 5

## 2011-12-30 MED ORDER — SODIUM CHLORIDE 0.9 % IV SOLN
150.0000 mg | Freq: Once | INTRAVENOUS | Status: AC
  Administered 2011-12-30: 150 mg via INTRAVENOUS
  Filled 2011-12-30: qty 5

## 2011-12-30 MED ORDER — PROMETHAZINE HCL 25 MG/ML IJ SOLN
12.5000 mg | Freq: Four times a day (QID) | INTRAMUSCULAR | Status: DC | PRN
  Administered 2011-12-30: 12.5 mg via INTRAVENOUS
  Filled 2011-12-30: qty 1

## 2011-12-30 MED ORDER — SODIUM CHLORIDE 0.9 % IJ SOLN
10.0000 mL | INTRAMUSCULAR | Status: DC | PRN
  Administered 2011-12-30: 10 mL
  Filled 2011-12-30: qty 10

## 2011-12-30 MED ORDER — SODIUM CHLORIDE 0.9 % IV SOLN
Freq: Once | INTRAVENOUS | Status: AC
  Administered 2011-12-30: 10:00:00 via INTRAVENOUS

## 2011-12-30 MED ORDER — SODIUM CHLORIDE 0.9 % IV SOLN
1.2500 mg/m2 | Freq: Once | INTRAVENOUS | Status: AC
  Administered 2011-12-30: 1950 ug via INTRAVENOUS
  Filled 2011-12-30: qty 3.9

## 2011-12-30 MED ORDER — DEXAMETHASONE SODIUM PHOSPHATE 10 MG/ML IJ SOLN
10.0000 mg | Freq: Once | INTRAMUSCULAR | Status: AC
  Administered 2011-12-30: 10 mg via INTRAVENOUS

## 2011-12-30 NOTE — Patient Instructions (Addendum)
[facility] Discharge Instructions for Patients Receiving Chemotherapy  Today you received the following chemotherapy agents: dactinomycin   To help prevent nausea and vomiting after your treatment, we encourage you to take your nausea medication.  Take it as often as prescribed.     If you develop nausea and vomiting that is not controlled by your nausea medication, call the clinic. If it is after clinic hours your family physician or the after hours number for the clinic or go to the Emergency Department.   BELOW ARE SYMPTOMS THAT SHOULD BE REPORTED IMMEDIATELY:  *FEVER GREATER THAN 100.5 F  *CHILLS WITH OR WITHOUT FEVER  NAUSEA AND VOMITING THAT IS NOT CONTROLLED WITH YOUR NAUSEA MEDICATION  *UNUSUAL SHORTNESS OF BREATH  *UNUSUAL BRUISING OR BLEEDING  TENDERNESS IN MOUTH AND THROAT WITH OR WITHOUT PRESENCE OF ULCERS  *URINARY PROBLEMS  *BOWEL PROBLEMS  UNUSUAL RASH Items with * indicate a potential emergency and should be followed up as soon as possible.  Feel free to call the clinic you have any questions or concerns. The clinic phone number is [phone].   I have been informed and understand all the instructions given to me. I know to contact the clinic, my physician, or go to the Emergency Department if any problems should occur. I do not have any questions at this time, but understand that I may call the clinic during office hours   should I have any questions or need assistance in obtaining follow up care.    __________________________________________  _____________  __________ Signature of Patient or Authorized Representative            Date                   Time    __________________________________________ Nurse's Signature

## 2012-01-02 ENCOUNTER — Telehealth: Payer: Self-pay | Admitting: *Deleted

## 2012-01-02 NOTE — Telephone Encounter (Signed)
Message copied by Phillis Knack on Mon Jan 02, 2012  3:47 PM ------      Message from: Jama Flavors P      Created: Wed Dec 28, 2011  4:17 PM       Labs seen and need follow up: please let her know HCG is <2 !! Keep treatment 11-22 as scheduled

## 2012-01-02 NOTE — Telephone Encounter (Signed)
Notified of message below

## 2012-01-04 ENCOUNTER — Other Ambulatory Visit: Payer: BC Managed Care – PPO | Admitting: Lab

## 2012-01-04 ENCOUNTER — Other Ambulatory Visit (HOSPITAL_BASED_OUTPATIENT_CLINIC_OR_DEPARTMENT_OTHER): Payer: BC Managed Care – PPO | Admitting: Lab

## 2012-01-04 DIAGNOSIS — O019 Hydatidiform mole, unspecified: Secondary | ICD-10-CM

## 2012-01-04 LAB — HCG, QUANTITATIVE, PREGNANCY: hCG, Beta Chain, Quant, S: 3.1 m[IU]/mL

## 2012-01-06 ENCOUNTER — Telehealth: Payer: Self-pay

## 2012-01-06 NOTE — Telephone Encounter (Signed)
Message copied by Lorine Bears on Fri Jan 06, 2012 12:02 PM ------      Message from: Jama Flavors P      Created: Wed Jan 04, 2012  4:31 PM       Labs seen and need follow up: please let her know HCG is 3.1, which is still in normal range of <5. We will repeat at visit next week. Please put printed copy in gyn onc office also.

## 2012-01-06 NOTE — Telephone Encounter (Signed)
Told Ms. Brooker the HCG level as noted below by Dr. Darrold Span.

## 2012-01-10 ENCOUNTER — Other Ambulatory Visit (HOSPITAL_COMMUNITY): Payer: Self-pay | Admitting: Oncology

## 2012-01-10 NOTE — Progress Notes (Signed)
Med Onc information note  Dr Yolande Jolly reviewed HCG values including <2.0 on 12-28-11 and 3.1 on 01-04-12, and recommends treating with another cycle of actinomycin D on 01-13-12.  I spoke with patient by phone about this now. She will keep appointment with me tomorrow as scheduled. Confirmed with infusion that space is available and POF to schedulers now for treatment 01-13-12.   Ila Mcgill, MD

## 2012-01-11 ENCOUNTER — Other Ambulatory Visit (HOSPITAL_BASED_OUTPATIENT_CLINIC_OR_DEPARTMENT_OTHER): Payer: BC Managed Care – PPO | Admitting: Lab

## 2012-01-11 ENCOUNTER — Telehealth: Payer: Self-pay | Admitting: *Deleted

## 2012-01-11 ENCOUNTER — Ambulatory Visit (HOSPITAL_BASED_OUTPATIENT_CLINIC_OR_DEPARTMENT_OTHER): Payer: BC Managed Care – PPO | Admitting: Oncology

## 2012-01-11 ENCOUNTER — Encounter: Payer: Self-pay | Admitting: Oncology

## 2012-01-11 ENCOUNTER — Other Ambulatory Visit: Payer: Self-pay | Admitting: *Deleted

## 2012-01-11 ENCOUNTER — Telehealth: Payer: Self-pay | Admitting: Oncology

## 2012-01-11 VITALS — BP 114/79 | HR 76 | Temp 97.4°F | Resp 18 | Ht 62.0 in | Wt 121.3 lb

## 2012-01-11 DIAGNOSIS — D392 Neoplasm of uncertain behavior of placenta: Secondary | ICD-10-CM

## 2012-01-11 DIAGNOSIS — O019 Hydatidiform mole, unspecified: Secondary | ICD-10-CM

## 2012-01-11 LAB — CBC WITH DIFFERENTIAL/PLATELET
BASO%: 0.4 % (ref 0.0–2.0)
Basophils Absolute: 0 10*3/uL (ref 0.0–0.1)
EOS%: 2.2 % (ref 0.0–7.0)
Eosinophils Absolute: 0.1 10*3/uL (ref 0.0–0.5)
HCT: 36.4 % (ref 34.8–46.6)
HGB: 11.9 g/dL (ref 11.6–15.9)
LYMPH%: 37.4 % (ref 14.0–49.7)
MCH: 28.9 pg (ref 25.1–34.0)
MCHC: 32.8 g/dL (ref 31.5–36.0)
MCV: 88.2 fL (ref 79.5–101.0)
MONO#: 0.6 10*3/uL (ref 0.1–0.9)
MONO%: 11.6 % (ref 0.0–14.0)
NEUT#: 2.4 10*3/uL (ref 1.5–6.5)
NEUT%: 48.4 % (ref 38.4–76.8)
Platelets: 131 10*3/uL — ABNORMAL LOW (ref 145–400)
RBC: 4.13 10*6/uL (ref 3.70–5.45)
RDW: 13.4 % (ref 11.2–14.5)
WBC: 4.9 10*3/uL (ref 3.9–10.3)
lymph#: 1.8 10*3/uL (ref 0.9–3.3)

## 2012-01-11 LAB — COMPREHENSIVE METABOLIC PANEL (CC13)
ALT: 12 U/L (ref 0–55)
AST: 14 U/L (ref 5–34)
Albumin: 4 g/dL (ref 3.5–5.0)
Alkaline Phosphatase: 38 U/L — ABNORMAL LOW (ref 40–150)
BUN: 11 mg/dL (ref 7.0–26.0)
CO2: 22 mEq/L (ref 22–29)
Calcium: 9.3 mg/dL (ref 8.4–10.4)
Chloride: 109 mEq/L — ABNORMAL HIGH (ref 98–107)
Creatinine: 1 mg/dL (ref 0.6–1.1)
Glucose: 92 mg/dl (ref 70–99)
Potassium: 3.8 mEq/L (ref 3.5–5.1)
Sodium: 142 mEq/L (ref 136–145)
Total Bilirubin: 0.51 mg/dL (ref 0.20–1.20)
Total Protein: 7.3 g/dL (ref 6.4–8.3)

## 2012-01-11 LAB — HCG, QUANTITATIVE, PREGNANCY: hCG, Beta Chain, Quant, S: 2.6 m[IU]/mL

## 2012-01-11 MED ORDER — LORAZEPAM 0.5 MG PO TABS: ORAL_TABLET | ORAL | Status: DC

## 2012-01-11 NOTE — Patient Instructions (Signed)
You can use ativan under tongue if too much nausea to swallow that

## 2012-01-11 NOTE — Telephone Encounter (Signed)
appts made and printed for pt Pt aware that i will call with 12/6 tx time and an email was sent to mw     anne

## 2012-01-11 NOTE — Progress Notes (Signed)
OFFICE PROGRESS NOTE   01/11/2012   Physicians:D.ClarkePearson; Angelica Chessman., MD, Ernestina Penna K,MD   INTERVAL HISTORY:  Patient is seen, together with husband (and 44 yo daughter) in continuing attention to her persistent nonmetastatic GTD, for which she has had 3 cycles of actinomycin D ( 12-02-11, 12-16-11 and 12-30-11) B HCG had been <2 by 12-28-11, but was up slightly to 3.1 on 01-04-12. Dr Yolande Jolly has recommended giving another cycle of the actinomycin D on schedule 01-13-12.  Patient had some abnormality with first trimester pregnancy in 2011, with termination of that pregnancy. 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 She had HCG of 21,067 on 09-27-11, then office D&C 09-30-11. She has been on oral BCP since August. HCG decreased to 339 at one week then continued to decline down to 82 on Sept 13. Subsequently the marker rose for 3 weeks, at 133,142 and 143 (11-11-11). She was seen by Dr Yolande Jolly on 11-23-11 and had CT CAP in [facility] system on 11-25-11 which showed no evidence of metastatic disease. Dr Yolande Jolly has recommended every other week actinomycin D at dose of 1.25 mg/m2 based on GOG information that this may be more effective than methotrexate. She had PAC by IR prior to starting the actinomycin D. Quantitative HCG sent from this office on 11-30-11 was 45.9 and was repeated 12-07-11 at 43.6. She had first actinomycin D on 12-02-11, with HCG on 12-14-11 down to 11.5. First cycle of chemo was complicated by significant nausea and mild stomatitis;nausea was better cycle 2 with addition of EMEND and she had no stomatitis using ice chips po during chemo infusion. She was restless with compazine, which has been discontinued.  Ativan has been more helpful than phenergan for nausea since chemo, tho she has had no nausea at all past 2 days. She had unusually severe nausea then diarrhea on 01-04-12, which  may have been a gastroenteritis that has been in the community, this resolved by 01-05-12. Chronic migraines seem at baseline and I have suggested referral to [facility] at some point. She has had no vaginal bleeding in past 2 weeks, no pelvic cramping or discomfort, no gas. She does notice reflux when she has nausea after chemo, has not been using the protonix and carafate quite regularly and will do that. She denies shortness of breath or other respiratory symptoms, no fever, no problems with PAC, no other neurologic symptoms. Remainder of 10 point Review of Systems negative.  Objective:  Vital signs in last 24 hours:  BP 114/79  Pulse 76  Temp 97.4 F (36.3 C) (Oral)  Resp 18  Ht 5\' 2"  (1.575 m)  Wt 121 lb 4.8 oz (55.021 kg)  BMI 22.19 kg/m2 Weight is up ~ 0.5 lb. Easily ambulatory, looks comfortable, talkative.   HEENT:PERRLA, sclera clear, anicteric and oropharynx clear, no lesions LymphaticsCervical, supraclavicular, and axillary nodes normal.slight dull erythema posterior pharynx consistent with post nasal drainage. Resp: clear to auscultation bilaterally and normal percussion bilaterally Cardio: regular rate and rhythm GI: soft, non-tender; bowel sounds normal; no masses,  no organomegaly Extremities: extremities normal, atraumatic, no cyanosis or edema Neuro:no sensory deficits noted Portacath-without erythema or tenderness  Lab Results:  Results for orders placed in visit on 01/11/12  CBC WITH DIFFERENTIAL      Component Value Range   WBC 4.9  3.9 - 10.3 10e3/uL   NEUT# 2.4  1.5 - 6.5 10e3/uL   HGB 11.9  11.6 - 15.9  g/dL   HCT 16.1  09.6 - 04.5 %   Platelets 131 (*) 145 - 400 10e3/uL   MCV 88.2  79.5 - 101.0 fL   MCH 28.9  25.1 - 34.0 pg   MCHC 32.8  31.5 - 36.0 g/dL   RBC 4.09  8.11 - 9.14 10e6/uL   RDW 13.4  11.2 - 14.5 %   lymph# 1.8  0.9 - 3.3 10e3/uL   MONO# 0.6  0.1 - 0.9 10e3/uL   Eosinophils Absolute 0.1  0.0 - 0.5 10e3/uL   Basophils Absolute 0.0   0.0 - 0.1 10e3/uL   NEUT% 48.4  38.4 - 76.8 %   LYMPH% 37.4  14.0 - 49.7 %   MONO% 11.6  0.0 - 14.0 %   EOS% 2.2  0.0 - 7.0 %   BASO% 0.4  0.0 - 2.0 %   CMET and B HCG pending  Studies/Results:  No results found.  Medications: I have reviewed the patient's current medications. She will be sure to take the carafate ac/hs and the protonix daily in AMs. She is carefully compliant with the OCP.  Assessment/Plan: 1. persistent nonmetastatic GTD: slight bump in B HCG after cycle 3 actinomycinD: to give cycle 4 on 01-13-12. Will recheck B HCG on 12-11 and I will see her with cbc/cmet/BHCG on 01-25-12. Will keep gyn oncology informed of situation 2.PAC in 3.chronic migraines preceding GTD diagnosis  Patient and husband were comfortable with plan above.        Oday Ridings P, MD   01/11/2012, 9:34 AM

## 2012-01-11 NOTE — Telephone Encounter (Signed)
Refill for ativan called to [facility]

## 2012-01-11 NOTE — Telephone Encounter (Signed)
Per staff message and POF I have scheduled appt.  [name]  

## 2012-01-12 ENCOUNTER — Telehealth: Payer: Self-pay | Admitting: *Deleted

## 2012-01-12 NOTE — Telephone Encounter (Signed)
Pt notified of labs per Dr Precious Reel note below

## 2012-01-12 NOTE — Telephone Encounter (Signed)
Message copied by Phillis Knack on Thu Jan 12, 2012 10:47 AM ------      Message from: Jama Flavors P      Created: Wed Jan 11, 2012  3:03 PM       Labs seen and need follow up: please let her know HCG today is 2.6. Will do the chemo on Friday as planned. Chemistries are good.

## 2012-01-13 ENCOUNTER — Ambulatory Visit (HOSPITAL_BASED_OUTPATIENT_CLINIC_OR_DEPARTMENT_OTHER): Payer: BC Managed Care – PPO

## 2012-01-13 VITALS — BP 116/73 | HR 94 | Temp 98.1°F

## 2012-01-13 DIAGNOSIS — Z5111 Encounter for antineoplastic chemotherapy: Secondary | ICD-10-CM

## 2012-01-13 DIAGNOSIS — D392 Neoplasm of uncertain behavior of placenta: Secondary | ICD-10-CM

## 2012-01-13 DIAGNOSIS — O019 Hydatidiform mole, unspecified: Secondary | ICD-10-CM

## 2012-01-13 MED ORDER — SODIUM CHLORIDE 0.9 % IV SOLN
1.2500 mg/m2 | Freq: Once | INTRAVENOUS | Status: AC
  Administered 2012-01-13: 1950 ug via INTRAVENOUS
  Filled 2012-01-13: qty 3.9

## 2012-01-13 MED ORDER — SODIUM CHLORIDE 0.9 % IJ SOLN
10.0000 mL | INTRAMUSCULAR | Status: DC | PRN
  Administered 2012-01-13: 10 mL
  Filled 2012-01-13: qty 10

## 2012-01-13 MED ORDER — SODIUM CHLORIDE 0.9 % IV SOLN
Freq: Once | INTRAVENOUS | Status: AC
  Administered 2012-01-13: 09:00:00 via INTRAVENOUS

## 2012-01-13 MED ORDER — HEPARIN SOD (PORK) LOCK FLUSH 100 UNIT/ML IV SOLN
500.0000 [IU] | Freq: Once | INTRAVENOUS | Status: AC | PRN
  Administered 2012-01-13: 500 [IU]
  Filled 2012-01-13: qty 5

## 2012-01-13 MED ORDER — DEXAMETHASONE SODIUM PHOSPHATE 10 MG/ML IJ SOLN
10.0000 mg | Freq: Once | INTRAMUSCULAR | Status: AC
  Administered 2012-01-13: 10 mg via INTRAVENOUS

## 2012-01-13 MED ORDER — SODIUM CHLORIDE 0.9 % IV SOLN
150.0000 mg | Freq: Once | INTRAVENOUS | Status: AC
  Administered 2012-01-13: 150 mg via INTRAVENOUS
  Filled 2012-01-13: qty 5

## 2012-01-13 MED ORDER — PROMETHAZINE HCL 25 MG/ML IJ SOLN
12.5000 mg | Freq: Four times a day (QID) | INTRAMUSCULAR | Status: DC | PRN
  Administered 2012-01-13: 12.5 mg via INTRAVENOUS
  Filled 2012-01-13: qty 1

## 2012-01-13 NOTE — Patient Instructions (Addendum)
[facility] Discharge Instructions for Patients Receiving Chemotherapy  Today you received the following chemotherapy agents Dactinomycin To help prevent nausea and vomiting after your treatment, we encourage you to take your nausea medication as prescribed Begin taking it as directed and take it as often as prescribed for the next 48-72 hours.   If you develop nausea and vomiting that is not controlled by your nausea medication, call the clinic. If it is after clinic hours your family physician or the after hours number for the clinic or go to the Emergency Department.   BELOW ARE SYMPTOMS THAT SHOULD BE REPORTED IMMEDIATELY:  *FEVER GREATER THAN 100.5 F  *CHILLS WITH OR WITHOUT FEVER  NAUSEA AND VOMITING THAT IS NOT CONTROLLED WITH YOUR NAUSEA MEDICATION  *UNUSUAL SHORTNESS OF BREATH  *UNUSUAL BRUISING OR BLEEDING  TENDERNESS IN MOUTH AND THROAT WITH OR WITHOUT PRESENCE OF ULCERS  *URINARY PROBLEMS  *BOWEL PROBLEMS  UNUSUAL RASH Items with * indicate a potential emergency and should be followed up as soon as possible.  One of the nurses will contact you 24 hours after your treatment. Please let the nurse know about any problems that you may have experienced. Feel free to call the clinic you have any questions or concerns. The clinic phone number is [phone].   I have been informed and understand all the instructions given to me. I know to contact the clinic, my physician, or go to the Emergency Department if any problems should occur. I do not have any questions at this time, but understand that I may call the clinic during office hours   should I have any questions or need assistance in obtaining follow up care.    __________________________________________  _____________  __________ Signature of Patient or Authorized Representative            Date                   Time    __________________________________________ Nurse's Signature

## 2012-01-17 ENCOUNTER — Other Ambulatory Visit: Payer: Self-pay | Admitting: Certified Registered Nurse Anesthetist

## 2012-01-18 ENCOUNTER — Other Ambulatory Visit (HOSPITAL_BASED_OUTPATIENT_CLINIC_OR_DEPARTMENT_OTHER): Payer: BC Managed Care – PPO

## 2012-01-18 DIAGNOSIS — O019 Hydatidiform mole, unspecified: Secondary | ICD-10-CM

## 2012-01-18 LAB — HCG, QUANTITATIVE, PREGNANCY: hCG, Beta Chain, Quant, S: <2 m[IU]/mL

## 2012-01-19 ENCOUNTER — Telehealth: Payer: Self-pay | Admitting: *Deleted

## 2012-01-19 NOTE — Telephone Encounter (Signed)
Pt notified of results below, will keep appt next week as scheduled

## 2012-01-19 NOTE — Telephone Encounter (Signed)
Message copied by Phillis Knack on Thu Jan 19, 2012  9:13 AM ------      Message from: Jama Flavors P      Created: Wed Jan 18, 2012  8:07 PM       Labs seen and need follow up: please let her know marker is back to <2.0 today. Keep apt next week as scheduled

## 2012-01-23 ENCOUNTER — Telehealth: Payer: Self-pay

## 2012-01-23 NOTE — Telephone Encounter (Signed)
Called pt. Regarding HCG level from 01-18-12.  Tammi had called her last week also.

## 2012-01-23 NOTE — Telephone Encounter (Signed)
Message copied by Lorine Bears on Mon Jan 23, 2012 11:58 AM ------      Message from: Reece Packer      Created: Wed Jan 18, 2012  8:07 PM       Labs seen and need follow up: please let her know marker is back to <2.0 today. Keep apt next week as scheduled

## 2012-01-25 ENCOUNTER — Encounter: Payer: Self-pay | Admitting: Oncology

## 2012-01-25 ENCOUNTER — Other Ambulatory Visit (HOSPITAL_BASED_OUTPATIENT_CLINIC_OR_DEPARTMENT_OTHER): Payer: BC Managed Care – PPO | Admitting: Lab

## 2012-01-25 ENCOUNTER — Ambulatory Visit (HOSPITAL_BASED_OUTPATIENT_CLINIC_OR_DEPARTMENT_OTHER): Payer: BC Managed Care – PPO | Admitting: Oncology

## 2012-01-25 ENCOUNTER — Telehealth: Payer: Self-pay | Admitting: Oncology

## 2012-01-25 VITALS — BP 126/77 | HR 75 | Temp 97.4°F | Resp 18 | Ht 62.0 in | Wt 123.1 lb

## 2012-01-25 DIAGNOSIS — D392 Neoplasm of uncertain behavior of placenta: Secondary | ICD-10-CM

## 2012-01-25 DIAGNOSIS — O019 Hydatidiform mole, unspecified: Secondary | ICD-10-CM

## 2012-01-25 DIAGNOSIS — G43909 Migraine, unspecified, not intractable, without status migrainosus: Secondary | ICD-10-CM

## 2012-01-25 LAB — CBC WITH DIFFERENTIAL/PLATELET
BASO%: 0.4 % (ref 0.0–2.0)
Basophils Absolute: 0 10*3/uL (ref 0.0–0.1)
EOS%: 2.3 % (ref 0.0–7.0)
Eosinophils Absolute: 0.1 10*3/uL (ref 0.0–0.5)
HCT: 35.8 % (ref 34.8–46.6)
HGB: 11.7 g/dL (ref 11.6–15.9)
LYMPH%: 33.9 % (ref 14.0–49.7)
MCH: 28.8 pg (ref 25.1–34.0)
MCHC: 32.8 g/dL (ref 31.5–36.0)
MCV: 87.8 fL (ref 79.5–101.0)
MONO#: 0.4 10*3/uL (ref 0.1–0.9)
MONO%: 8.8 % (ref 0.0–14.0)
NEUT#: 2.4 10*3/uL (ref 1.5–6.5)
NEUT%: 54.6 % (ref 38.4–76.8)
Platelets: 139 10*3/uL — ABNORMAL LOW (ref 145–400)
RBC: 4.08 10*6/uL (ref 3.70–5.45)
RDW: 13.7 % (ref 11.2–14.5)
WBC: 4.5 10*3/uL (ref 3.9–10.3)
lymph#: 1.5 10*3/uL (ref 0.9–3.3)

## 2012-01-25 LAB — COMPREHENSIVE METABOLIC PANEL (CC13)
ALT: 12 U/L (ref 0–55)
AST: 15 U/L (ref 5–34)
Albumin: 4.1 g/dL (ref 3.5–5.0)
Alkaline Phosphatase: 39 U/L — ABNORMAL LOW (ref 40–150)
BUN: 11 mg/dL (ref 7.0–26.0)
CO2: 22 mEq/L (ref 22–29)
Calcium: 9.4 mg/dL (ref 8.4–10.4)
Chloride: 108 mEq/L — ABNORMAL HIGH (ref 98–107)
Creatinine: 0.9 mg/dL (ref 0.6–1.1)
Glucose: 89 mg/dl (ref 70–99)
Potassium: 3.7 mEq/L (ref 3.5–5.1)
Sodium: 144 mEq/L (ref 136–145)
Total Bilirubin: 0.71 mg/dL (ref 0.20–1.20)
Total Protein: 7.4 g/dL (ref 6.4–8.3)

## 2012-01-25 LAB — HCG, QUANTITATIVE, PREGNANCY: hCG, Beta Chain, Quant, S: <2 m[IU]/mL

## 2012-01-25 NOTE — Telephone Encounter (Signed)
Gave pt appt  For December 2013 and January 2014 lab and MD

## 2012-01-25 NOTE — Patient Instructions (Signed)
Appointments as scheduled.  Continue OCP.

## 2012-01-25 NOTE — Progress Notes (Signed)
OFFICE PROGRESS NOTE   01/25/2012   Physicians: D.ClarkePearson, K.Fogleman, R.Aguiar  INTERVAL HISTORY:  Patient is seen, together with husband, in continuing attention to her persistent, nonmetastatic GTD, this treated with 4 cycles of actinomycin D from 12-02-11 thru 01-13-12. As long as marker remains in good range, she will now be on observation. Patient had some abnormality with first trimester pregnancy in 2011, with termination of that pregnancy. 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 She had HCG of 21,067 on 09-27-11, then office D&C 09-30-11. She has been on oral BCP since August. HCG decreased to 339 at one week then continued to decline down to 82 on Sept 13. Subsequently the marker rose for 3 weeks, at 133,142 and 143 (11-11-11). She was seen by Dr Yolande Jolly on 11-23-11 and had CT CAP in [name] system on 11-25-11 which showed no evidence of metastatic disease. Dr Yolande Jolly recommended every other week actinomycin D at 1.25 mg/m2 based on GOG information that this may be more effective than methotrexate. She had PAC by IR prior to starting the actinomycin D. Quantitative HCG sent from this office on 11-30-11 was 45.9 and was repeated 12-07-11 at 43.6. She had first actinomycin D on 12-02-11, with HCG on 12-14-11 down to 11.5. B HCG was <2 by 12-28-11, but was up slightly to 3.1 on 01-04-12. Dr Yolande Jolly recommended giving another cycle of the actinomycin D on schedule 01-13-12. The marker was 2.6 on 01-11-12 and <2.0 on 01-18-12.   First cycle of chemo was complicated by significant nausea and mild stomatitis;nausea was better cycle 2 with addition of EMEND and she had no stomatitis using ice chips po during chemo infusion. She was intolerant to compazine, with restlessness. Zofran was avoided due to her migraine history.  Patient has some fatigue but generally has felt well this week. She denies new  neurologic symptoms, shortness of breath or chest pain, fever or symptoms of infection, bleeding, abdominal or pelvic pain. She continues OCP per gyn. Remainder of 10 point Review of Systems negative.  Objective:  Vital signs in last 24 hours:  BP 126/77  Pulse 75  Temp 97.4 F (36.3 C) (Oral)  Resp 18  Ht 5\' 2"  (1.575 m)  Wt 123 lb 1.6 oz (55.838 kg)  BMI 22.52 kg/m2 Weight is up 2 lbs. Easily ambulatory, looks comfortable.    HEENT:PERRLA, sclera clear, anicteric and oropharynx clear, no lesions LymphaticsCervical, supraclavicular, and axillary nodes normal. Resp: clear to auscultation bilaterally and normal percussion bilaterally Cardio: regular rate and rhythm GI: soft, non-tender; bowel sounds normal; no masses,  no organomegaly Extremities: extremities normal, atraumatic, no cyanosis or edema Neuro nonfocal Skin without rash or ecchymosis Portacath -without erythema or tenderness  Lab Results:  Results for orders placed in visit on 01/25/12  CBC WITH DIFFERENTIAL      Component Value Range   WBC 4.5  3.9 - 10.3 10e3/uL   NEUT# 2.4  1.5 - 6.5 10e3/uL   HGB 11.7  11.6 - 15.9 g/dL   HCT 65.7  84.6 - 96.2 %   Platelets 139 (*) 145 - 400 10e3/uL   MCV 87.8  79.5 - 101.0 fL   MCH 28.8  25.1 - 34.0 pg   MCHC 32.8  31.5 - 36.0 g/dL   RBC 9.52  8.41 - 3.24 10e6/uL   RDW 13.7  11.2 - 14.5 %   lymph# 1.5  0.9 - 3.3 10e3/uL   MONO#  0.4  0.1 - 0.9 10e3/uL   Eosinophils Absolute 0.1  0.0 - 0.5 10e3/uL   Basophils Absolute 0.0  0.0 - 0.1 10e3/uL   NEUT% 54.6  38.4 - 76.8 %   LYMPH% 33.9  14.0 - 49.7 %   MONO% 8.8  0.0 - 14.0 %   EOS% 2.3  0.0 - 7.0 %   BASO% 0.4  0.0 - 2.0 %  COMPREHENSIVE METABOLIC PANEL (CC13)      Component Value Range   Sodium 144  136 - 145 mEq/L   Potassium 3.7  3.5 - 5.1 mEq/L   Chloride 108 (*) 98 - 107 mEq/L   CO2 22  22 - 29 mEq/L   Glucose 89  70 - 99 mg/dl   BUN 41.3  7.0 - 24.4 mg/dL   Creatinine 0.9  0.6 - 1.1 mg/dL   Total Bilirubin  0.10  0.20 - 1.20 mg/dL   Alkaline Phosphatase 39 (*) 40 - 150 U/L   AST 15  5 - 34 U/L   ALT 12  0 - 55 U/L   Total Protein 7.4  6.4 - 8.3 g/dL   Albumin 4.1  3.5 - 5.0 g/dL   Calcium 9.4  8.4 - 27.2 mg/dL    B HCG available after visit < 2.0  Studies/Results:  No results found.  Medications: I have reviewed the patient's current medications.  PAC was flushed with treatment 01-13-12; patient is in agreement with keeping this in for now, in case she needs further treatment in short term. It will be due flush again between Jan 17 and 31.  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 B HCG <2.0 today. Will follow marker weekly x 4 at this office; I will see her back with marker + cbc/cmet in Jan. If [facility] needs to stay in longer, it will be due flush as above. I will let gyn oncology know situation in case they want to see her back, and otherwise Dr Ernestina Penna likely will continue to monitor the monthly HCG from there for a year. 2.chronic migraine headaches, which preceded this diagnosis. She may want referral to [facility] at some point 3.patient declined flu vaccine        Reece Packer, MD   01/25/2012, 2:10 PM

## 2012-01-30 ENCOUNTER — Telehealth: Payer: Self-pay

## 2012-01-30 NOTE — Telephone Encounter (Signed)
Told Ms. Kaitlin Ferrell her  HCG level as noted below. Told Ms. Kaitlin Ferrell that she could have a toast over the holiday but should keep alcoholic beverages to a minimum as it can interfere with liver chemistries.  Pt. Verbalized understanding.

## 2012-01-30 NOTE — Telephone Encounter (Signed)
Message copied by Lorine Bears on Mon Jan 30, 2012  4:20 PM ------      Message from: Reece Packer      Created: Fri Jan 27, 2012  8:56 PM       Labs seen and need follow up: please let her know HCG on 01-25-12 was still in good range <2.0. Keep appointments for labs upcoming as scheduled.

## 2012-01-31 ENCOUNTER — Other Ambulatory Visit (HOSPITAL_BASED_OUTPATIENT_CLINIC_OR_DEPARTMENT_OTHER): Payer: BC Managed Care – PPO | Admitting: Lab

## 2012-01-31 DIAGNOSIS — O019 Hydatidiform mole, unspecified: Secondary | ICD-10-CM

## 2012-01-31 LAB — HCG, QUANTITATIVE, PREGNANCY: hCG, Beta Chain, Quant, S: <2 m[IU]/mL

## 2012-02-02 ENCOUNTER — Telehealth: Payer: Self-pay

## 2012-02-02 NOTE — Telephone Encounter (Signed)
Message copied by Lorine Bears on Thu Feb 02, 2012  3:35 PM ------      Message from: Jama Flavors P      Created: Thu Feb 02, 2012  3:15 PM       Labs seen and need follow up: please let her know HCG is in good range this week at <2.0

## 2012-02-02 NOTE — Telephone Encounter (Signed)
Told Ms. Krog HCG results as noted below by Dr. Darrold Span.

## 2012-02-07 ENCOUNTER — Other Ambulatory Visit (HOSPITAL_BASED_OUTPATIENT_CLINIC_OR_DEPARTMENT_OTHER): Payer: BC Managed Care – PPO

## 2012-02-07 DIAGNOSIS — D392 Neoplasm of uncertain behavior of placenta: Secondary | ICD-10-CM

## 2012-02-07 DIAGNOSIS — O019 Hydatidiform mole, unspecified: Secondary | ICD-10-CM

## 2012-02-07 LAB — HCG, QUANTITATIVE, PREGNANCY: hCG, Beta Chain, Quant, S: 2.6 m[IU]/mL

## 2012-02-10 ENCOUNTER — Telehealth: Payer: Self-pay

## 2012-02-10 ENCOUNTER — Telehealth: Payer: Self-pay | Admitting: *Deleted

## 2012-02-10 NOTE — Telephone Encounter (Signed)
Message copied by Lorine Bears on Fri Feb 10, 2012  2:04 PM ------      Message from: Reece Packer      Created: Tue Feb 07, 2012  5:39 PM       Labs seen and need follow up: please let patient know level 2.6 today, which is still in good <5 range. We will let gyn oncology know and will continue weekly labs as planned.  Please also put printed copy of hCGs thru 12-28-11 including this most recent value in gyn onc office.  thanks

## 2012-02-10 NOTE — Telephone Encounter (Signed)
Pt given f/u plan for weekly hcg thru January then monthly with next being 04/02/12 and f/u appt with Dr Stanford Breed 04/06/12 @ 0945

## 2012-02-10 NOTE — Telephone Encounter (Signed)
Told Ms. Albertsen HCG lab value as noted below by Dr. Darrold Span.  Gave copy of labs as requested by Dr. Darrold Span to GYN ONC.

## 2012-02-13 ENCOUNTER — Encounter: Payer: Self-pay | Admitting: Oncology

## 2012-02-13 NOTE — Progress Notes (Signed)
Per Dr. Dow Adolph 01/25/12 office note patient is on observation only at this point.  Therefore I will not renew the Authorization at this time with [facility] [id number].

## 2012-02-14 ENCOUNTER — Other Ambulatory Visit (HOSPITAL_BASED_OUTPATIENT_CLINIC_OR_DEPARTMENT_OTHER): Payer: BC Managed Care – PPO

## 2012-02-14 DIAGNOSIS — O019 Hydatidiform mole, unspecified: Secondary | ICD-10-CM

## 2012-02-14 LAB — HCG, QUANTITATIVE, PREGNANCY: hCG, Beta Chain, Quant, S: <2 m[IU]/mL

## 2012-02-16 ENCOUNTER — Telehealth: Payer: Self-pay

## 2012-02-16 NOTE — Telephone Encounter (Signed)
Message copied by Lorine Bears on Thu Feb 16, 2012  1:29 PM ------      Message from: Reece Packer      Created: Tue Feb 14, 2012  2:47 PM       Labs seen and need follow up: please let her know HCG <2.0

## 2012-02-16 NOTE — Telephone Encounter (Signed)
Told Kaitlin Ferrell her HCG level as noted below by  Dr. Darrold Span.

## 2012-02-20 ENCOUNTER — Other Ambulatory Visit: Payer: Self-pay | Admitting: Oncology

## 2012-02-21 ENCOUNTER — Ambulatory Visit: Payer: BC Managed Care – PPO

## 2012-02-21 ENCOUNTER — Other Ambulatory Visit (HOSPITAL_BASED_OUTPATIENT_CLINIC_OR_DEPARTMENT_OTHER): Payer: BC Managed Care – PPO | Admitting: Lab

## 2012-02-21 ENCOUNTER — Ambulatory Visit (HOSPITAL_BASED_OUTPATIENT_CLINIC_OR_DEPARTMENT_OTHER): Payer: BC Managed Care – PPO | Admitting: Oncology

## 2012-02-21 ENCOUNTER — Encounter: Payer: Self-pay | Admitting: Oncology

## 2012-02-21 ENCOUNTER — Telehealth: Payer: Self-pay | Admitting: Oncology

## 2012-02-21 VITALS — BP 104/63 | HR 61 | Temp 98.4°F | Resp 16 | Ht 63.0 in | Wt 126.1 lb

## 2012-02-21 DIAGNOSIS — O019 Hydatidiform mole, unspecified: Secondary | ICD-10-CM

## 2012-02-21 LAB — CBC WITH DIFFERENTIAL/PLATELET
BASO%: 0.6 % (ref 0.0–2.0)
Basophils Absolute: 0 10*3/uL (ref 0.0–0.1)
EOS%: 2.8 % (ref 0.0–7.0)
Eosinophils Absolute: 0.2 10*3/uL (ref 0.0–0.5)
HCT: 36 % (ref 34.8–46.6)
HGB: 11.8 g/dL (ref 11.6–15.9)
LYMPH%: 28.9 % (ref 14.0–49.7)
MCH: 28.8 pg (ref 25.1–34.0)
MCHC: 32.9 g/dL (ref 31.5–36.0)
MCV: 87.7 fL (ref 79.5–101.0)
MONO#: 0.3 10*3/uL (ref 0.1–0.9)
MONO%: 6.1 % (ref 0.0–14.0)
NEUT#: 3.5 10*3/uL (ref 1.5–6.5)
NEUT%: 61.6 % (ref 38.4–76.8)
Platelets: 165 10*3/uL (ref 145–400)
RBC: 4.1 10*6/uL (ref 3.70–5.45)
RDW: 14.2 % (ref 11.2–14.5)
WBC: 5.6 10*3/uL (ref 3.9–10.3)
lymph#: 1.6 10*3/uL (ref 0.9–3.3)

## 2012-02-21 LAB — COMPREHENSIVE METABOLIC PANEL (CC13)
ALT: <6 U/L (ref 0–55)
AST: 13 U/L (ref 5–34)
Albumin: 3.8 g/dL (ref 3.5–5.0)
Alkaline Phosphatase: 40 U/L (ref 40–150)
BUN: 11 mg/dL (ref 7.0–26.0)
CO2: 20 mEq/L — ABNORMAL LOW (ref 22–29)
Calcium: 9.2 mg/dL (ref 8.4–10.4)
Chloride: 111 mEq/L — ABNORMAL HIGH (ref 98–107)
Creatinine: 0.9 mg/dL (ref 0.6–1.1)
Glucose: 70 mg/dl (ref 70–99)
Potassium: 3.4 mEq/L — ABNORMAL LOW (ref 3.5–5.1)
Sodium: 140 mEq/L (ref 136–145)
Total Bilirubin: 0.45 mg/dL (ref 0.20–1.20)
Total Protein: 7.6 g/dL (ref 6.4–8.3)

## 2012-02-21 LAB — HCG, QUANTITATIVE, PREGNANCY: hCG, Beta Chain, Quant, S: <2 m[IU]/mL

## 2012-02-21 MED ORDER — HEPARIN SOD (PORK) LOCK FLUSH 100 UNIT/ML IV SOLN
500.0000 [IU] | Freq: Once | INTRAVENOUS | Status: AC
  Administered 2012-02-21: 500 [IU] via INTRAVENOUS
  Filled 2012-02-21: qty 5

## 2012-02-21 MED ORDER — SODIUM CHLORIDE 0.9 % IJ SOLN
10.0000 mL | INTRAMUSCULAR | Status: DC | PRN
  Administered 2012-02-21: 10 mL via INTRAVENOUS
  Filled 2012-02-21: qty 10

## 2012-02-21 NOTE — Telephone Encounter (Signed)
Gave pt appt for lab and flush on January 2014

## 2012-02-21 NOTE — Patient Instructions (Signed)
We will ask Interventional Radiology to take out portacath when ok with Dr Yolande Jolly. If it stays in, it should be flushed every 6-8 weeks.

## 2012-02-21 NOTE — Progress Notes (Signed)
OFFICE PROGRESS NOTE   02/21/2012   Physicians:D.ClarkePearson, K.Fogleman, R.Aguiar   INTERVAL HISTORY:   Patient is seen, together with husband, in continuing attention to her history of GTD, this treated with 4 cycles of actinomycin D from 10-25 thru 01-13-12 for persistent, nonmetastatic disease. We are following HCG weekly thru end of this month, then expect to go to monthly if no problems. She has PAC still in place, which she agrees to keep at least until she sees Dr Yolande Jolly in late February. This was placed by IR and can be removed by that service when ok with gyn onc. She continues OCP.  Patient had some abnormality with first trimester pregnancy in 2011, with termination of that pregnancy. 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 She had HCG of 21,067 on 09-27-11, then office D&C 09-30-11. She has been on oral BCP since August. HCG decreased to 339 at one week then continued to decline down to 82 on Sept 13. Subsequently the marker rose for 3 weeks, at 133,142 and 143 (11-11-11). She was seen by Dr Yolande Jolly on 11-23-11 and had CT CAP in [place] system on 11-25-11 which showed no evidence of metastatic disease. Dr Yolande Jolly recommended every other week actinomycin D at 1.25 mg/m2 based on GOG information that this may be more effective than methotrexate. She had PAC by IR prior to starting the actinomycin D. Quantitative HCG sent from this office on 11-30-11 was 45.9 and was repeated 12-07-11 at 43.6. She had first actinomycin D on 12-02-11, with HCG on 12-14-11 down to 11.5. B HCG was <2 by 12-28-11, but was up slightly to 3.1 on 01-04-12, and she was given a fourth cycle of the actinomycin D on  01-13-12. The marker was 2.6 on 01-11-12 and <2.0 on 01-18-12. Markers since then have all been <2.0 with exception of 2.6 on 02-07-12.   Patient has felt entirely well since she was here last, with exception of  her frequent chronic headaches. I have again suggested referral to [facility], which she will discuss with PCP. She has had no menses since last chemotherapy. Appetite and energy are good, no respiratory symptoms, no problems with PAC, no other bleeding, no fever or symptoms of infection. Remainder of 10 point Review of Systems negative.  Objective:  Vital signs in last 24 hours:  BP 104/63  Pulse 61  Temp 98.4 F (36.9 C) (Oral)  Resp 16  Ht 5\' 3"  (1.6 m)  Wt 126 lb 1.6 oz (57.199 kg)  BMI 22.34 kg/m2   Weight is up 3 lbs. Easily ambulatory, looks comfortable HEENT:PERRLA, sclera clear, anicteric, oropharynx clear, no lesions and neck supple with midline trachea LymphaticsCervical, supraclavicular, and axillary nodes normal. Resp: clear to auscultation bilaterally and normal percussion bilaterally Cardio: regular rate and rhythm GI: soft, non-tender; bowel sounds normal; no masses,  no organomegaly. Soft small umbilical hernia, nontender, reduces. Extremities: extremities normal, atraumatic, no cyanosis or edema Neuro:no sensory deficits noted Breasts bilaterally:normal without suspicious masses, skin or nipple changes or axillary nodes Portacath/PICC-without erythema  Lab Results:  Results for orders placed in visit on 02/21/12  CBC WITH DIFFERENTIAL      Component Value Range   WBC 5.6  3.9 - 10.3 10e3/uL   NEUT# 3.5  1.5 - 6.5 10e3/uL   HGB 11.8  11.6 - 15.9 g/dL   HCT 45.4  09.8 - 11.9 %   Platelets 165  145 - 400 10e3/uL  MCV 87.7  79.5 - 101.0 fL   MCH 28.8  25.1 - 34.0 pg   MCHC 32.9  31.5 - 36.0 g/dL   RBC 1.47  8.29 - 5.62 10e6/uL   RDW 14.2  11.2 - 14.5 %   lymph# 1.6  0.9 - 3.3 10e3/uL   MONO# 0.3  0.1 - 0.9 10e3/uL   Eosinophils Absolute 0.2  0.0 - 0.5 10e3/uL   Basophils Absolute 0.0  0.0 - 0.1 10e3/uL   NEUT% 61.6  38.4 - 76.8 %   LYMPH% 28.9  14.0 - 49.7 %   MONO% 6.1  0.0 - 14.0 %   EOS% 2.8  0.0 - 7.0 %   BASO% 0.6  0.0 - 2.0 %   HCG  available after visit <2.0  CMET normal with exception of K+ 3.4, chloride 111,   Studies/Results:  No results found.  Medications: I have reviewed the patient's current medications.  Assessment/Plan: 1.GTD: history as above, including 4 cycles of actinomycin D . Clinically doing well. Continue weekly markers thru end of Jan, then repeat in one month with Dr Wanita Chamberlain visit late Feb. I am glad to see her back at any time if needed, but have not set up any scheduled appointments now. I expect she will be referred back to primary gynecologist after the visit to Dr Yolande Jolly. Continue OCP 2.PAC still in : will be flushed within the week; will need flushes every 6-8 weeks if not taken out following Dr Wanita Chamberlain visit, tho patient hopes it can be removed then, 3. Chronic headaches: discussed referral as above.       LIVESAY,LENNIS P, MD   02/21/2012, 1:38 PM

## 2012-02-22 ENCOUNTER — Telehealth: Payer: Self-pay

## 2012-02-22 NOTE — Telephone Encounter (Signed)
Told Kaitlin Ferrell her labs results as noted below by Dr. Darrold Span.  Suggested foods rich in KCL such as OJ, cantelope, and strawberries.  Pt. Verbalized understanding.

## 2012-02-22 NOTE — Telephone Encounter (Signed)
Message copied by Lorine Bears on Wed Feb 22, 2012 10:57 AM ------      Message from: Reece Packer      Created: Tue Feb 21, 2012  8:40 PM       Labs seen and need follow up: please let her know marker good at <2.0 and other chemistries ok with exception of slightly low K= --  Please increase in diet

## 2012-02-23 ENCOUNTER — Encounter: Payer: Self-pay | Admitting: *Deleted

## 2012-02-29 ENCOUNTER — Other Ambulatory Visit (HOSPITAL_BASED_OUTPATIENT_CLINIC_OR_DEPARTMENT_OTHER): Payer: BC Managed Care – PPO

## 2012-02-29 DIAGNOSIS — O019 Hydatidiform mole, unspecified: Secondary | ICD-10-CM

## 2012-02-29 LAB — HCG, QUANTITATIVE, PREGNANCY: hCG, Beta Chain, Quant, S: <2 m[IU]/mL

## 2012-03-02 ENCOUNTER — Telehealth: Payer: Self-pay

## 2012-03-02 NOTE — Telephone Encounter (Signed)
Told Kaitlin Ferrell her HCG level as noted below by Dr. Darrold Span.  She is to keep lab appt. as scheduled 03-07-12.

## 2012-03-02 NOTE — Telephone Encounter (Signed)
Message copied by Lorine Bears on Fri Mar 02, 2012  4:26 PM ------      Message from: Reece Packer      Created: Fri Mar 02, 2012  9:07 AM       Labs seen and need follow up: please let her know still good at <2

## 2012-03-07 ENCOUNTER — Telehealth: Payer: Self-pay | Admitting: Oncology

## 2012-03-07 ENCOUNTER — Encounter: Payer: Self-pay | Admitting: *Deleted

## 2012-03-07 ENCOUNTER — Other Ambulatory Visit: Payer: BC Managed Care – PPO | Admitting: Lab

## 2012-03-07 NOTE — Telephone Encounter (Signed)
pt  had called to r /s lab,done     anne

## 2012-03-09 ENCOUNTER — Other Ambulatory Visit: Payer: BC Managed Care – PPO | Admitting: Lab

## 2012-03-09 DIAGNOSIS — O019 Hydatidiform mole, unspecified: Secondary | ICD-10-CM

## 2012-03-09 LAB — HCG, QUANTITATIVE, PREGNANCY: hCG, Beta Chain, Quant, S: <2 m[IU]/mL

## 2012-03-13 ENCOUNTER — Telehealth: Payer: Self-pay

## 2012-03-13 NOTE — Telephone Encounter (Signed)
Told Kaitlin Ferrell the results of her HCG level as noted below by Dr. Darrold Span.

## 2012-03-13 NOTE — Telephone Encounter (Signed)
Message copied by Lorine Bears on Tue Mar 13, 2012  1:29 PM ------      Message from: Reece Packer      Created: Sun Mar 11, 2012 12:44 PM       Labs seen and need follow up: please let her know marker good from 03-09-12 at <2.0

## 2012-04-06 ENCOUNTER — Other Ambulatory Visit: Payer: BC Managed Care – PPO | Admitting: Lab

## 2012-04-06 ENCOUNTER — Ambulatory Visit: Payer: BC Managed Care – PPO | Attending: Gynecology | Admitting: Gynecology

## 2012-04-06 ENCOUNTER — Ambulatory Visit (HOSPITAL_BASED_OUTPATIENT_CLINIC_OR_DEPARTMENT_OTHER): Payer: BC Managed Care – PPO

## 2012-04-06 ENCOUNTER — Encounter: Payer: Self-pay | Admitting: Gynecology

## 2012-04-06 VITALS — BP 143/71 | HR 50 | Temp 98.2°F

## 2012-04-06 VITALS — BP 122/72 | HR 62 | Temp 98.4°F | Resp 20 | Ht 63.11 in | Wt 130.5 lb

## 2012-04-06 DIAGNOSIS — Z452 Encounter for adjustment and management of vascular access device: Secondary | ICD-10-CM

## 2012-04-06 DIAGNOSIS — O019 Hydatidiform mole, unspecified: Secondary | ICD-10-CM

## 2012-04-06 DIAGNOSIS — D392 Neoplasm of uncertain behavior of placenta: Secondary | ICD-10-CM

## 2012-04-06 LAB — HCG, QUANTITATIVE, PREGNANCY: hCG, Beta Chain, Quant, S: <2 m[IU]/mL

## 2012-04-06 MED ORDER — HEPARIN SOD (PORK) LOCK FLUSH 100 UNIT/ML IV SOLN
500.0000 [IU] | Freq: Once | INTRAVENOUS | Status: AC
  Administered 2012-04-06: 500 [IU] via INTRAVENOUS
  Filled 2012-04-06: qty 5

## 2012-04-06 MED ORDER — SODIUM CHLORIDE 0.9 % IJ SOLN
10.0000 mL | INTRAMUSCULAR | Status: DC | PRN
  Administered 2012-04-06: 10 mL via INTRAVENOUS
  Filled 2012-04-06: qty 10

## 2012-04-06 NOTE — Patient Instructions (Signed)
Call MD for problems 

## 2012-04-06 NOTE — Progress Notes (Signed)
Consult Note: Gyn-Onc   Kaitlin Ferrell 47 y.o. female  Chief Complaint  Patient presents with  . GTD    Follow up    Patient is seen, together with husband, in continuing attention to her history of GTD, this treated with 4 cycles of actinomycin D from 10-25 thru 01-13-12 for persistent, nonmetastatic disease.  She is now having monthly hcg levels.  She continues OCP.  Since her last visit she's done well. She denies any GI or GU symptoms. She has no pelvic pain pressure vaginal bleeding or discharge. Her menstrual periods are cyclic on birth control pills.  She is eager to have her Port-A-Cath removed.    Review of Systems:10 point review of systems is negative as noted above.   Vitals: Blood pressure 122/72, pulse 62, temperature 98.4 F (36.9 C), resp. rate 20, height 5' 3.11" (1.603 m), weight 130 lb 8 oz (59.194 kg), last menstrual period 03/28/2012.  Physical Exam: General : The patient is a healthy woman in no acute distress.  HEENT: normocephalic, extraoccular movements normal; neck is supple without thyromegally  Lynphnodes: Supraclavicular and inguinal nodes not enlarged  Abdomen: Soft, non-tender, no ascites, no organomegally, no masses, no hernias  Pelvic:  EGBUS: Normal female  Vagina: Normal, no lesions  Urethra and Bladder: Normal, non-tender , moderate cystocele Cervix: Normal  Uterus: Anterior, normal shape size and consistency Bi-manual examination: Non-tender; no adenxal masses or nodularity  Rectal: normal sphincter tone, no masses, no blood  Lower extremities: No edema or varicosities. Normal range of motion    Assessment/Plan: Nonmetastatic GTD status post treatment with intravenous actinomycin D. The patient is in remission since December 2013. We will continue to have monthly hCG is obtained through Dr. Roseanne Kaufman office. We will schedule her to have her Port-A-Cath removed in the near future.  Allergies  Allergen Reactions  . Compazine  (Prochlorperazine Edisylate)     Felt crazy  . Labetalol Hives, Itching and Swelling    Past Medical History  Diagnosis Date  . Miscarriage   . Fibrocystic breast changes   . Migraine   . GTD (gestational trophoblastic disease)     Past Surgical History  Procedure Laterality Date  . Dilation and curettage of uterus  1992, 09/2011  . Wisdom tooth extraction      in 11th grade  . Breast biopsy  2001    Left  . Dilation and evacuation  2011    Current Outpatient Prescriptions  Medication Sig Dispense Refill  . Norethindrone-Ethinyl Estradiol-Fe Biphas (LO LOESTRIN FE) 1 MG-10 MCG / 10 MCG tablet Take 1 tablet by mouth daily.      . SUMAtriptan (IMITREX) 100 MG tablet Take 100 mg by mouth every 2 (two) hours as needed. margines      . topiramate (TOPAMAX) 25 MG tablet Take 25 mg by mouth 3 (three) times daily.      . Alum & Mag Hydroxide-Simeth (MAGIC MOUTHWASH W/LIDOCAINE) SOLN Take 5 mLs by mouth 4 (four) times daily as needed (for mouth and throat soreness). Swish and swallow  120 mL  1  . ibuprofen (ADVIL,MOTRIN) 200 MG tablet Take 400 mg by mouth every 6 (six) hours as needed. Pain      . lidocaine-prilocaine (EMLA) cream Apply topically as needed. Apply to PAC 1-2 hours prior to access  30 g  0  . LORazepam (ATIVAN) 0.5 MG tablet 1 tablet under the tongue to by mouth (SL/PO) every 4-6 hours as needed for nausea- will make drowsy.  30 tablet  0  . pantoprazole (PROTONIX) 40 MG tablet Take 1 tablet (40 mg total) by mouth daily.  30 tablet  1  . promethazine (PHENERGAN) 25 MG tablet Take 1 tablet (25 mg total) by mouth every 6 (six) hours as needed for nausea. Will make drowsy  30 tablet  0  . sucralfate (CARAFATE) 1 GM/10ML suspension Take 10 mLs (1 g total) by mouth 4 (four) times daily -  with meals and at bedtime. For throat soreness  420 mL  0   No current facility-administered medications for this visit.    History   Social History  . Marital Status: Married    Spouse  Name: N/A    Number of Children: N/A  . Years of Education: N/A   Occupational History  . Not on file.   Social History Main Topics  . Smoking status: Never Smoker   . Smokeless tobacco: Never Used  . Alcohol Use: Yes     Comment: occas  . Drug Use: No  . Sexually Active: Not Currently    Birth Control/ Protection: Pill   Other Topics Concern  . Not on file   Social History Narrative  . No narrative on file    Family History  Problem Relation Age of Onset  . Breast cancer Mother   . Heart attack Mother   . Diabetes Father   . Breast cancer Maternal Aunt   . Breast cancer Maternal Grandmother   . Stroke Paternal Grandmother   . Breast cancer Maternal Aunt       Jeannette Corpus, MD 04/06/2012, 10:19 AM

## 2012-04-06 NOTE — Patient Instructions (Signed)
Continued to have monthly hCG levels obtained through Dr. Doneta Public office through December of 2014. Continue oral contraceptives.

## 2012-04-09 ENCOUNTER — Telehealth: Payer: Self-pay

## 2012-04-09 NOTE — Telephone Encounter (Signed)
Spoke with Ms. Macneill and told her the result of her HCG level as noted below by Dr. Darrold Span.

## 2012-04-09 NOTE — Telephone Encounter (Signed)
Message copied by Lorine Bears on Mon Apr 09, 2012  7:04 PM ------      Message from: Reece Packer      Created: Sat Apr 07, 2012  3:48 PM       Labs seen and need follow up: please let her know this was good again at <2.0. ------

## 2012-04-25 ENCOUNTER — Other Ambulatory Visit: Payer: Self-pay | Admitting: Radiology

## 2012-04-25 ENCOUNTER — Other Ambulatory Visit: Payer: Self-pay | Admitting: *Deleted

## 2012-04-25 DIAGNOSIS — O019 Hydatidiform mole, unspecified: Secondary | ICD-10-CM

## 2012-04-25 DIAGNOSIS — D020 Carcinoma in situ of larynx: Secondary | ICD-10-CM

## 2012-04-26 ENCOUNTER — Encounter (HOSPITAL_COMMUNITY): Payer: Self-pay | Admitting: Pharmacy Technician

## 2012-04-30 ENCOUNTER — Ambulatory Visit (HOSPITAL_COMMUNITY)
Admission: RE | Admit: 2012-04-30 | Discharge: 2012-04-30 | Disposition: A | Discharge status: Home / Self Care | Payer: BC Managed Care – PPO | Source: Ambulatory Visit | Attending: Gynecology | Admitting: Gynecology

## 2012-04-30 ENCOUNTER — Encounter (HOSPITAL_COMMUNITY): Payer: Self-pay

## 2012-04-30 DIAGNOSIS — O019 Hydatidiform mole, unspecified: Secondary | ICD-10-CM | POA: Insufficient documentation

## 2012-04-30 DIAGNOSIS — Z452 Encounter for adjustment and management of vascular access device: Secondary | ICD-10-CM | POA: Insufficient documentation

## 2012-04-30 DIAGNOSIS — Z9221 Personal history of antineoplastic chemotherapy: Secondary | ICD-10-CM | POA: Insufficient documentation

## 2012-04-30 LAB — CBC
HCT: 38.4 % (ref 36.0–46.0)
Hemoglobin: 12.8 g/dL (ref 12.0–15.0)
MCH: 28.6 pg (ref 26.0–34.0)
MCHC: 33.3 g/dL (ref 30.0–36.0)
MCV: 85.7 fL (ref 78.0–100.0)
Platelets: 180 10*3/uL (ref 150–400)
RBC: 4.48 MIL/uL (ref 3.87–5.11)
RDW: 12.8 % (ref 11.5–15.5)
WBC: 5.3 10*3/uL (ref 4.0–10.5)

## 2012-04-30 LAB — PROTIME-INR
INR: 1.03 (ref 0.00–1.49)
Prothrombin Time: 13.4 seconds (ref 11.6–15.2)

## 2012-04-30 LAB — APTT: aPTT: 32 seconds (ref 24–37)

## 2012-04-30 MED ORDER — SODIUM CHLORIDE 0.9 % IV SOLN
INTRAVENOUS | Status: DC
  Administered 2012-04-30: 12:00:00 via INTRAVENOUS

## 2012-04-30 MED ORDER — FENTANYL CITRATE 0.05 MG/ML IJ SOLN
INTRAMUSCULAR | Status: AC | PRN
  Administered 2012-04-30: 50 ug via INTRAVENOUS

## 2012-04-30 MED ORDER — MIDAZOLAM HCL 2 MG/2ML IJ SOLN
INTRAMUSCULAR | Status: AC
  Filled 2012-04-30: qty 6

## 2012-04-30 MED ORDER — CEFAZOLIN SODIUM 1-5 GM-% IV SOLN: 1.0000 g | INTRAVENOUS | Status: DC

## 2012-04-30 MED ORDER — MIDAZOLAM HCL 2 MG/2ML IJ SOLN
INTRAMUSCULAR | Status: AC | PRN
  Administered 2012-04-30: 1 mg via INTRAVENOUS

## 2012-04-30 MED ORDER — FENTANYL CITRATE 0.05 MG/ML IJ SOLN
INTRAMUSCULAR | Status: AC
  Filled 2012-04-30: qty 6

## 2012-04-30 MED ORDER — CEFAZOLIN SODIUM 1-5 GM-% IV SOLN
INTRAVENOUS | Status: AC
  Filled 2012-04-30: qty 50

## 2012-04-30 NOTE — Procedures (Signed)
Successful removal of right anterior chest wall port-a-cath. No immediate post procedural complications.  

## 2012-04-30 NOTE — H&P (Signed)
Kaitlin Ferrell is an 47 y.o. female.   Chief Complaint: "I'm here to get my port out" HPI: Patient with history of nonmetastatic gestational trophoblastic disease, currently in remission, presents today for port a cath removal.  Past Medical History  Diagnosis Date  . Miscarriage   . Fibrocystic breast changes   . Migraine   . GTD (gestational trophoblastic disease)     Past Surgical History  Procedure Laterality Date  . Dilation and curettage of uterus  1992, 09/2011  . Wisdom tooth extraction      in 11th grade  . Breast biopsy  2001    Left  . Dilation and evacuation  2011    Family History  Problem Relation Age of Onset  . Breast cancer Mother   . Heart attack Mother   . Diabetes Father   . Breast cancer Maternal Aunt   . Breast cancer Maternal Grandmother   . Stroke Paternal Grandmother   . Breast cancer Maternal Aunt    Social History:  reports that she has never smoked. She has never used smokeless tobacco. She reports that  drinks alcohol. She reports that she does not use illicit drugs.  Allergies:  Allergies  Allergen Reactions  . Compazine (Prochlorperazine Edisylate)     Felt crazy  . Labetalol Hives, Itching and Swelling    Current outpatient prescriptions:Norethindrone-Ethinyl Estradiol-Fe Biphas (LO LOESTRIN FE) 1 MG-10 MCG / 10 MCG tablet, Take 1 tablet by mouth at bedtime. , Disp: , Rfl: ;  OVER THE COUNTER MEDICATION, Take 400 mg by mouth every 6 (six) hours as needed (For migraine headaches if Sumatriptan does not work.). Advil Liqui-Gels (Ibuprofen) 200mg ., Disp: , Rfl:  SUMAtriptan (IMITREX) 100 MG tablet, Take 100 mg by mouth every 2 (two) hours as needed for migraine. , Disp: , Rfl: ;  topiramate (TOPAMAX) 25 MG tablet, Take 75 mg by mouth at bedtime. , Disp: , Rfl:  No current facility-administered medications for this encounter. Facility-Administered Medications Ordered in Other Encounters: 0.9 %  sodium chloride infusion, , Intravenous,  Continuous, D Jeananne Rama, PA-C, Last Rate: 20 mL/hr at 04/30/12 1228;  ceFAZolin (ANCEF) IVPB 1 g/50 mL premix, 1 g, Intravenous, On Call, D Jeananne Rama, PA-C  Results for orders placed in visit on 04/06/12  HCG, QUANTITATIVE, PREGNANCY      Result Value Range   hCG, Beta Chain, Quant, S <2.0    04/30/12  WBC 5.3  HGB 12.8  PLTS 180K   PT 13.4  INR 1.03  PTT  32  Review of Systems  Constitutional: Negative for fever and chills.  Respiratory: Negative for cough and shortness of breath.   Cardiovascular: Negative for chest pain.  Gastrointestinal: Negative for nausea, vomiting and abdominal pain.  Musculoskeletal: Negative for back pain.  Neurological: Negative for headaches.  Endo/Heme/Allergies: Does not bruise/bleed easily.   Vitals:   BP 118/78  HR  61  R 16  TEMP 97.3  O2 SATS 100%RA Last menstrual period 03/28/2012. Physical Exam  Constitutional: She is oriented to person, place, and time. She appears well-developed and well-nourished.  Cardiovascular: Normal rate and regular rhythm.   Respiratory: Effort normal and breath sounds normal.  Intact, clean rt IJ port a cath  GI: Soft. Bowel sounds are normal.  Musculoskeletal: Normal range of motion. She exhibits no edema.  Neurological: She is alert and oriented to person, place, and time.     Assessment/Plan Pt with history of nonmetastatic gestational trophoblastic disease, currently in remission. Plan is  for port a cath removal today. Details/risks of procedure d/w pt with her understanding and consent.  [name],D KEVIN 04/30/2012, 1:10 PM

## 2012-06-14 ENCOUNTER — Other Ambulatory Visit: Payer: Self-pay | Admitting: Oncology

## 2012-06-15 ENCOUNTER — Other Ambulatory Visit: Payer: Self-pay | Admitting: *Deleted

## 2012-06-15 MED ORDER — NORETHIN-ETH ESTRAD-FE BIPHAS 1 MG-10 MCG / 10 MCG PO TABS: 1.0000 | ORAL_TABLET | Freq: Every day | ORAL | Status: DC

## 2012-08-14 ENCOUNTER — Ambulatory Visit (INDEPENDENT_AMBULATORY_CARE_PROVIDER_SITE_OTHER): Payer: Self-pay | Admitting: Surgery

## 2012-08-21 ENCOUNTER — Encounter (INDEPENDENT_AMBULATORY_CARE_PROVIDER_SITE_OTHER): Payer: Self-pay

## 2012-08-27 ENCOUNTER — Telehealth (INDEPENDENT_AMBULATORY_CARE_PROVIDER_SITE_OTHER): Payer: Self-pay | Admitting: Surgery

## 2012-08-27 ENCOUNTER — Ambulatory Visit (INDEPENDENT_AMBULATORY_CARE_PROVIDER_SITE_OTHER): Payer: BC Managed Care – PPO | Admitting: Surgery

## 2012-08-27 ENCOUNTER — Encounter (INDEPENDENT_AMBULATORY_CARE_PROVIDER_SITE_OTHER): Payer: Self-pay | Admitting: Surgery

## 2012-08-27 VITALS — BP 112/64 | HR 68 | Temp 97.9°F | Resp 14 | Ht 62.0 in | Wt 132.4 lb

## 2012-08-27 DIAGNOSIS — K439 Ventral hernia without obstruction or gangrene: Secondary | ICD-10-CM | POA: Insufficient documentation

## 2012-08-27 DIAGNOSIS — K429 Umbilical hernia without obstruction or gangrene: Secondary | ICD-10-CM

## 2012-08-27 DIAGNOSIS — M6200 Separation of muscle (nontraumatic), unspecified site: Secondary | ICD-10-CM

## 2012-08-27 DIAGNOSIS — M6208 Separation of muscle (nontraumatic), other site: Secondary | ICD-10-CM

## 2012-08-27 NOTE — Telephone Encounter (Signed)
Per patient she will call back to schedule her surgical procedure

## 2012-08-27 NOTE — Progress Notes (Signed)
Subjective:     Patient ID: Kaitlin Ferrell, female   DOB: 1966/01/12, 47 y.o.   MRN: [id number]  HPI  [name]  [birth year] [id number]  Patient Care Team: Rafaela M. Riley Nearing, MD as PCP - General (Family Medicine) Jeannette Corpus, MD as Consulting Physician (Gynecology) Reece Packer, MD as Consulting Physician (Medical Oncology)  This patient is a 47 y.o.female who presents today for surgical evaluation at the request of Dr. Riley Nearing.   Reason for visit: Umbilical swelling.  Probable hernia  Pleasant female.  Survivor of gestational trophoblastic disease.  Received chemotherapy.  Disease free.  Port has been out for four months.  Has noted a hernia at her bellybutton for some time.  It is gotten larger and is more sensitive.  Therefore, centimeter for evaluation.  Patient rather active.  Can walk 2 miles without any difficulty.  No history of MRSA or infections.  Daily bowel movements.  No nausea or vomiting.  No history of abdominal surgeries.  Patient Active Problem List   Diagnosis Date Noted  . Umbilical hernia 08/27/2012  . Diastasis recti 08/27/2012  . Migraine headache 12/14/2011  . 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 Miscarriage   . Fibrocystic breast changes   . Migraine   . GTD (gestational trophoblastic disease)     Past Surgical History  Procedure Laterality Date  . Dilation and curettage of uterus  1992, 09/2011  . Wisdom tooth extraction      in 11th grade  . Breast biopsy  2001    Left  . Dilation and evacuation  2011  . Portacath placement  11/2011    History   Social History  . Marital Status: Married    Spouse Name: N/A    Number of Children: N/A  . Years of Education: N/A   Occupational History  . Not on file.   Social History Main Topics  . Smoking status: Never Smoker   . Smokeless tobacco: Never Used  . Alcohol Use: Yes     Comment: occas  . Drug  Use: No  . Sexually Active: Not Currently    Birth Control/ Protection: Pill   Other Topics Concern  . Not on file   Social History Narrative  . No narrative on file    Family History  Problem Relation Age of Onset  . Breast cancer Mother   . Heart attack Mother   . Diabetes Father   . Breast cancer Maternal Aunt   . Breast cancer Maternal Grandmother   . Stroke Paternal Grandmother   . Breast cancer Maternal Aunt     Current Outpatient Prescriptions  Medication Sig Dispense Refill  . Norethindrone-Ethinyl Estradiol-Fe Biphas (LO LOESTRIN FE) 1 MG-10 MCG / 10 MCG tablet Take 1 tablet by mouth daily.  28 tablet  1  . Probiotic Product (ALIGN) 4 MG CAPS Take by mouth.      . SUMAtriptan (IMITREX) 100 MG tablet Take 100 mg by mouth every 2 (two) hours as needed for migraine.       . topiramate (TOPAMAX) 25 MG tablet Take 75 mg by mouth at bedtime.       Marland Kitchen OVER THE COUNTER MEDICATION Take 400 mg by mouth every 6 (six) hours as needed (For migraine headaches if Sumatriptan does not work.). Advil Liqui-Gels (Ibuprofen) 200mg .       No current facility-administered medications for this visit.     Allergies  Allergen Reactions  . Compazine (Prochlorperazine Edisylate)     Felt crazy  . Labetalol Hives, Itching and Swelling    BP 112/64  Pulse 68  Temp(Src) 97.9 F (36.6 C) (Temporal)  Resp 14  Ht 5\' 2"  (1.575 m)  Wt 132 lb 6.4 oz (60.056 kg)  BMI 24.21 kg/m2  No results found.   Review of Systems  Constitutional: Negative for fever, chills, diaphoresis, appetite change and fatigue.  HENT: Negative for ear pain, sore throat, trouble swallowing, neck pain and ear discharge.   Eyes: Negative for photophobia, discharge and visual disturbance.  Respiratory: Negative for cough, choking, chest tightness and shortness of breath.   Cardiovascular: Negative for chest pain and palpitations.  Gastrointestinal: Positive for abdominal pain. Negative for nausea, vomiting, diarrhea,  constipation, anal bleeding and rectal pain.  Endocrine: Negative for cold intolerance and heat intolerance.  Genitourinary: Negative for dysuria, frequency and difficulty urinating.  Musculoskeletal: Negative for myalgias and gait problem.  Skin: Negative for color change, pallor and rash.  Allergic/Immunologic: Negative for environmental allergies, food allergies and immunocompromised state.  Neurological: Negative for dizziness, speech difficulty, weakness and numbness.  Hematological: Negative for adenopathy.  Psychiatric/Behavioral: Negative for confusion and agitation. The patient is not nervous/anxious.        Objective:   Physical Exam  Constitutional: She is oriented to person, place, and time. She appears well-developed and well-nourished. No distress.  HENT:  Head: Normocephalic.  Mouth/Throat: Oropharynx is clear and moist. No oropharyngeal exudate.  Eyes: Conjunctivae and EOM are normal. Pupils are equal, round, and reactive to light. No scleral icterus.  Neck: Normal range of motion. Neck supple. No tracheal deviation present.  Cardiovascular: Normal rate, regular rhythm and intact distal pulses.   Pulmonary/Chest: Effort normal and breath sounds normal. No stridor. No respiratory distress. She exhibits no tenderness.  Abdominal: Soft. She exhibits no distension and no mass. There is no tenderness. Hernia confirmed negative in the right inguinal area and confirmed negative in the left inguinal area.    Genitourinary: No vaginal discharge found.  Musculoskeletal: Normal range of motion. She exhibits no tenderness.       Right elbow: She exhibits normal range of motion.       Left elbow: She exhibits normal range of motion.       Right wrist: She exhibits normal range of motion.       Left wrist: She exhibits normal range of motion.       Right hand: Normal strength noted.       Left hand: Normal strength noted.  Lymphadenopathy:       Head (right side): No posterior  auricular adenopathy present.       Head (left side): No posterior auricular adenopathy present.    She has no cervical adenopathy.    She has no axillary adenopathy.       Right: No inguinal adenopathy present.       Left: No inguinal adenopathy present.  Neurological: She is alert and oriented to person, place, and time. No cranial nerve deficit. She exhibits normal muscle tone. Coordination normal.  Skin: Skin is warm and dry. No rash noted. She is not diaphoretic. No erythema.  Psychiatric: She has a normal mood and affect. Her behavior is normal. Judgment and thought content normal.       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  Getting larger and more symptomatic.  Plan:     I think she would benefit from repair.  Given the nearby diastases and greater than 2 cm and young age; recommend mesh reinforcement.  Reasonable to do laparoscopically.  I discussed it with her:  The anatomy & physiology of the abdominal wall was discussed.  The pathophysiology of hernias was discussed.  Natural history risks without surgery including progeressive enlargement, pain, incarceration & strangulation was discussed.   Contributors to complications such as smoking, obesity, diabetes, prior surgery, etc were discussed.   I feel the risks of no intervention will lead to serious problems that outweigh the operative risks; therefore, I recommended surgery to reduce and repair the hernia.  I explained laparoscopic techniques with possible need for an open approach.  I noted the probable use of mesh to patch and/or buttress the hernia repair  Risks such as bleeding, infection, abscess, need for further treatment, heart attack, death, and other risks were discussed.  I noted a good likelihood this will help address the problem.   Goals of post-operative recovery were discussed as well.  Possibility that this will not correct all symptoms was explained.   I stressed the importance of low-impact activity, aggressive pain control, avoiding constipation, & not pushing through pain to minimize risk of post-operative chronic pain or injury. Possibility of reherniation especially with smoking, obesity, diabetes, immunosuppression, and other health conditions was discussed.  We will work to minimize complications.     An educational handout further explaining the pathology & treatment options was given as well.  Questions were answered.  The patient expresses understanding & wishes to d/w her husband before proceeding with surgery.

## 2012-08-27 NOTE — Patient Instructions (Signed)
See the Handout(s) we gave you.  Consider surgery.  Please call our office at [phone] if you wish to schedule surgery or if you have further questions / concerns.   Hernia A hernia occurs when an internal organ pushes out through a weak spot in the abdominal wall. Hernias most commonly occur in the groin and around the navel. Hernias often can be pushed back into place (reduced). Most hernias tend to get worse over time. Some abdominal hernias can get stuck in the opening (irreducible or incarcerated hernia) and cannot be reduced. An irreducible abdominal hernia which is tightly squeezed into the opening is at risk for impaired blood supply (strangulated hernia). A strangulated hernia is a medical emergency. Because of the risk for an irreducible or strangulated hernia, surgery may be recommended to repair a hernia. CAUSES   Heavy lifting.  Prolonged coughing.  Straining to have a bowel movement.  A cut (incision) made during an abdominal surgery. HOME CARE INSTRUCTIONS   Bed rest is not required. You may continue your normal activities.  Avoid lifting more than 10 pounds (4.5 kg) or straining.  Cough gently. If you are a smoker it is best to stop. Even the best hernia repair can break down with the continual strain of coughing. Even if you do not have your hernia repaired, a cough will continue to aggravate the problem.  Do not wear anything tight over your hernia. Do not try to keep it in with an outside bandage or truss. These can damage abdominal contents if they are trapped within the hernia sac.  Eat a normal diet.  Avoid constipation. Straining over long periods of time will increase hernia size and encourage breakdown of repairs. If you cannot do this with diet alone, stool softeners may be used. SEEK IMMEDIATE MEDICAL CARE IF:   You have a fever.  You develop increasing abdominal pain.  You feel nauseous or vomit.  Your hernia is stuck outside the abdomen, looks  discolored, feels hard, or is tender.  You have any changes in your bowel habits or in the hernia that are unusual for you.  You have increased pain or swelling around the hernia.  You cannot push the hernia back in place by applying gentle pressure while lying down. MAKE SURE YOU:   Understand these instructions.  Will watch your condition.  Will get help right away if you are not doing well or get worse. Document Released: 01/24/2005 Document Revised: 04/18/2011 Document Reviewed: 09/13/2007 [employer] Patient Information 2014 [employer].  

## 2013-03-08 ENCOUNTER — Encounter: Payer: Self-pay | Admitting: Neurology

## 2013-03-11 ENCOUNTER — Encounter: Payer: Self-pay | Admitting: Neurology

## 2013-03-11 ENCOUNTER — Ambulatory Visit (INDEPENDENT_AMBULATORY_CARE_PROVIDER_SITE_OTHER): Payer: BC Managed Care – PPO | Admitting: Neurology

## 2013-03-11 ENCOUNTER — Encounter (INDEPENDENT_AMBULATORY_CARE_PROVIDER_SITE_OTHER): Payer: Self-pay

## 2013-03-11 VITALS — BP 121/69 | HR 56 | Ht 62.0 in | Wt 135.0 lb

## 2013-03-11 DIAGNOSIS — G43909 Migraine, unspecified, not intractable, without status migrainosus: Secondary | ICD-10-CM

## 2013-03-11 MED ORDER — NORTRIPTYLINE HCL 25 MG PO CAPS: ORAL_CAPSULE | ORAL | Status: DC

## 2013-03-11 MED ORDER — RIBOFLAVIN 100 MG PO TABS: 100.0000 mg | ORAL_TABLET | Freq: Two times a day (BID) | ORAL | Status: DC

## 2013-03-11 MED ORDER — RIZATRIPTAN BENZOATE 10 MG PO TBDP: 10.0000 mg | ORAL_TABLET | ORAL | Status: DC | PRN

## 2013-03-11 MED ORDER — TOPIRAMATE 50 MG PO TABS
50.0000 mg | ORAL_TABLET | Freq: Two times a day (BID) | ORAL | Status: DC
Start: 2013-03-11 — End: 2013-08-23

## 2013-03-11 NOTE — Patient Instructions (Signed)
Stop daily Advil use.  Magnesium oxide 400mg  twice a day

## 2013-03-11 NOTE — Progress Notes (Signed)
PATIENT: Kaitlin Ferrell DOB: Jun 19, 1965  HISTORICAL  Martin Smeal is referred by her [facility] Dr. Ruben Gottron for evaluation of migraine headaches  She has past medical history of gestational trophoblastic neoplasia, received chemotherapy from October 2014 to December 2014,  She had a long-standing history of migraine, since high school, getting worse over the past 5 years, she has headaches every day, involving different parts, vertex, temporal region, sometimes it is associated light noise sensitivity, nauseous, she has been taking Advil on daily basis over the past 5 years  She has tried Topamax 75 mg as preventive medication without helping her much, she also tried Imitrex, which only help her moderately.  Trigger for migraine stress, hungry, sleep deprivation, she is going for a lot of marital stress right now.  She denies lateralized motor or sensory deficit, reported normal MRI of the brain which was arranged by her primary care physician,   REVIEW OF SYSTEMS: Full 14 system review of systems performed and notable only for  headaches  ALLERGIES: Allergies  Allergen Reactions  . Compazine [Prochlorperazine Edisylate]     Very agitated  . Labetalol Hives, Itching and Swelling    HOME MEDICATIONS: Outpatient Prescriptions Prior to Visit  Medication Sig Dispense Refill  . Norethindrone-Ethinyl Estradiol-Fe Biphas (LO LOESTRIN FE) 1 MG-10 MCG / 10 MCG tablet Take 1 tablet by mouth daily.  28 tablet  1  . OVER THE COUNTER MEDICATION Take 400 mg by mouth every 6 (six) hours as needed (For migraine headaches if Sumatriptan does not work.). Advil Liqui-Gels (Ibuprofen) 200mg .      . Probiotic Product (ALIGN) 4 MG CAPS Take by mouth.      . SUMAtriptan (IMITREX) 100 MG tablet Take 100 mg by mouth every 2 (two) hours as needed for migraine.       . topiramate (TOPAMAX) 25 MG tablet Take 75 mg by mouth at bedtime.          PAST MEDICAL HISTORY: Past Medical History    Diagnosis Date  . Miscarriage   . Fibrocystic breast changes   . Migraine   . GTD (gestational trophoblastic disease)   . H/O molar pregnancy, antepartum     PAST SURGICAL HISTORY: Past Surgical History  Procedure Laterality Date  . Dilation and curettage of uterus  1992, 09/2011  . Wisdom tooth extraction      in 11th grade  . Breast biopsy  2001    Left  . Dilation and evacuation  2011  . Portacath placement  11/2011  . Breast lumpectomy      FAMILY HISTORY: Family History  Problem Relation Age of Onset  . Breast cancer Mother   . Heart attack Mother   . Diabetes Father   . Breast cancer Maternal Aunt   . Breast cancer Maternal Grandmother   . Stroke Paternal Grandmother   . Breast cancer Maternal Aunt   . Alcoholism      SOCIAL HISTORY:  History   Social History  . Marital Status: Married    Spouse Name: [name]    Number of Children: 2  . Years of Education: college   Occupational History    CMA-  Animal nutritionist   Social History Main Topics  . Smoking status: Never Smoker   . Smokeless tobacco: Never Used  . Alcohol Use: 0.0 oz/week     Comment: occas  . Drug Use: No  . Sexual Activity: Not Currently    Birth Control/ Protection: Pill   Other Topics  Concern  . Not on file   Social History Narrative   Patient is married [name]). Patient works part time at [employer].   Right handed.   Patient has her CMA.   Caffeine- None       PHYSICAL EXAM   Filed Vitals:   03/11/13 0901  BP: 121/69  Pulse: 56  Height: 5\' 2"  (1.575 m)  Weight: 135 lb (61.236 kg)    Not recorded    Body mass index is 24.69 kg/(m^2).   Generalized: In no acute distress  Neck: Supple, no carotid bruits   Cardiac: Regular rate rhythm  Pulmonary: Clear to auscultation bilaterally  Musculoskeletal: No deformity  Neurological examination  Mentation: Alert oriented to time, place, history taking, and causual conversation  Cranial nerve II-XII: Pupils were equal round  reactive to light extraocular movements were full, Visual field were full on confrontational test. Bilateral fundi were sharp.  Facial sensation and strength were normal. Hearing was intact to finger rubbing bilaterally. Uvula tongue midline.  head turning and shoulder shrug and were normal and symmetric.Tongue protrusion into cheek strength was normal.  Motor: normal tone, bulk and strength.  Sensory: Intact to fine touch, pinprick, preserved vibratory sensation, and proprioception at toes.  Coordination: Normal finger to nose, heel-to-shin bilaterally there was no truncal ataxia  Gait: Rising up from seated position without assistance, normal stance, without trunk ataxia, moderate stride, good arm swing, smooth turning, able to perform tiptoe, and heel walking without difficulty.   Romberg signs: Negative  Deep tendon reflexes: Brachioradialis 2/2, biceps 2/2, triceps 2/2, patellar 2/2, Achilles 2/2, plantar responses were flexor bilaterally.   DIAGNOSTIC DATA (LABS, IMAGING, TESTING) - I reviewed patient records, labs, notes, testing and imaging myself where available.  Lab Results  Component Value Date   WBC 5.3 04/30/2012   HGB 12.8 04/30/2012   HCT 38.4 04/30/2012   MCV 85.7 04/30/2012   PLT 180 04/30/2012      Component Value Date/Time   NA 140 02/21/2012 1205   K 3.4* 02/21/2012 1205   CL 111* 02/21/2012 1205   CO2 20* 02/21/2012 1205   GLUCOSE 70 02/21/2012 1205   BUN 11.0 02/21/2012 1205   CREATININE 0.9 02/21/2012 1205   CALCIUM 9.2 02/21/2012 1205   PROT 7.6 02/21/2012 1205   ALBUMIN 3.8 02/21/2012 1205   AST 13 02/21/2012 1205   ALT <6 Repeated and Verified 02/21/2012 1205   ALKPHOS 40 02/21/2012 1205   BILITOT 0.45 02/21/2012 1205    ASSESSMENT AND PLAN   48 year old Caucasian female, with a long-standing history of migraine, also a component of medicine rebound headaches because of daily Advil use, 1 we will increase her Topamax to 50 mg twice a day 2 add on nortriptyline,  titrating to 25 mg 2 tablets as needed. Also magnesium oxide 400 mg twice a day, riboflavin 100 mg twice a day    3 stop daily ibuprofen use 4. When necessary Maxalt  5. return to clinic with Hoyle Sauer in 3 months         Marcial Pacas, M.D. Ph.D.  [facility] [address] [place], [place] [address] [phone]

## 2013-04-02 ENCOUNTER — Telehealth: Payer: Self-pay | Admitting: Neurology

## 2013-04-02 MED ORDER — ELETRIPTAN HYDROBROMIDE 40 MG PO TABS: 40.0000 mg | ORAL_TABLET | ORAL | Status: DC | PRN

## 2013-04-02 NOTE — Telephone Encounter (Signed)
Pt states Maxalt is not helping at all please call

## 2013-04-02 NOTE — Telephone Encounter (Signed)
I have called Ms. Loletha Grayer, she has no response to Maxalt at all, Imitrex 100 mg helping her 50% of time, she is willing to try different triptan treatment, I have called in Relpax 40 mg as needed

## 2013-04-02 NOTE — Telephone Encounter (Signed)
Patient calling stating that Maxalt is not helping at all. Please advise.

## 2013-07-05 ENCOUNTER — Encounter (INDEPENDENT_AMBULATORY_CARE_PROVIDER_SITE_OTHER): Payer: Self-pay

## 2013-07-05 ENCOUNTER — Ambulatory Visit (INDEPENDENT_AMBULATORY_CARE_PROVIDER_SITE_OTHER): Payer: BC Managed Care – PPO | Admitting: Nurse Practitioner

## 2013-07-05 ENCOUNTER — Encounter: Payer: Self-pay | Admitting: Nurse Practitioner

## 2013-07-05 VITALS — BP 103/69 | HR 69 | Ht 63.0 in | Wt 149.0 lb

## 2013-07-05 DIAGNOSIS — G43909 Migraine, unspecified, not intractable, without status migrainosus: Secondary | ICD-10-CM

## 2013-07-05 NOTE — Patient Instructions (Signed)
Continue Relpax acutely Continue Topamax and nortriptyline as preventives Given list of migraine triggers Patient to keep a migraine diary Followup in 3 months

## 2013-07-05 NOTE — Progress Notes (Signed)
[facility]  PATIENT: Kaitlin Ferrell DOB: 02-05-1966   REASON FOR VISIT: for migraine   HISTORY OF PRESENT ILLNESS: Kaitlin Ferrell, 48 year old female returns for followup. She was initially evaluated for migraine by Dr. Krista Blue 03/11/2013.She has past medical history of gestational trophoblastic neoplasia, received chemotherapy from October 2014 to December 2014, She had a long-standing history of migraine, since high school, getting worse over the past 5 years, she has headaches every day, involving different parts, vertex, temporal region, sometimes it is associated light noise sensitivity, nauseous, she has been taking Advil on daily basis over the past 5 years  She has tried Topamax 75 mg as preventive medication without helping her much, she also tried Imitrex, which only help her moderately.  Trigger for migraine stress, hungry, sleep deprivation, she is going for a lot of marital stress right now.  She denies lateralized motor or sensory deficit, reported normal MRI of the brain which was arranged by her primary care physician,  UPDATE: 07/05/13 patient returns for followup. She is still having migraines less frequently than before. She is currently taking Topamax 100 mg daily and nortriptyline 50 mg at night. She uses Relpax acutely. Maxalt was not beneficial. She claims that stress and marital problems are the reason for her headaches. She has not had a record she is not aware of any other trigger. She returns for reevaluation. She has cut out her Advil for the most part.     REVIEW OF SYSTEMS: Full 14 system review of systems performed and notable only for those listed, all others are neg:  Constitutional: N/A  Cardiovascular: N/A  Ear/Nose/Throat: N/A  Skin: N/A  Eyes: N/A  Respiratory: N/A  Gastroitestinal: N/A  Hematology/Lymphatic: N/A  Endocrine: N/A Musculoskeletal:N/A  Allergy/Immunology: N/A  Neurological: Headache Psychiatric: N/A Sleep :  NA   ALLERGIES: Allergies  Allergen Reactions  . Compazine [Prochlorperazine Edisylate]     Very agitated  . Labetalol Hives, Itching and Swelling    HOME MEDICATIONS: Outpatient Prescriptions Prior to Visit  Medication Sig Dispense Refill  . eletriptan (RELPAX) 40 MG tablet Take 1 tablet (40 mg total) by mouth as needed for migraine or headache. One tablet by mouth at onset of headache. May repeat in 2 hours if headache persists or recurs.  15 tablet  12  . Ibuprofen (ADVIL PO) Take by mouth as needed.      . Multiple Vitamins-Minerals (ALIVE ONCE DAILY WOMENS 50+ PO) Take by mouth daily.      . Norethindrone-Ethinyl Estradiol-Fe Biphas (LO LOESTRIN FE) 1 MG-10 MCG / 10 MCG tablet Take 1 tablet by mouth daily.  28 tablet  1  . nortriptyline (PAMELOR) 25 MG capsule One tab po qhs xone week, then 2 tabs po qhs  60 capsule  12  . OVER THE COUNTER MEDICATION Take 400 mg by mouth every 6 (six) hours as needed (For migraine headaches if Sumatriptan does not work.). Advil Liqui-Gels (Ibuprofen) 200mg .      . Probiotic Product (ALIGN) 4 MG CAPS Take by mouth.      . Riboflavin 100 MG TABS Take 1 tablet (100 mg total) by mouth 2 (two) times daily.  60 tablet  12  . rizatriptan (MAXALT-MLT) 10 MG disintegrating tablet Take 1 tablet (10 mg total) by mouth as needed for migraine. May repeat in 2 hours if needed  15 tablet  12  . topiramate (TOPAMAX) 50 MG tablet Take 1 tablet (50 mg total) by mouth 2 (two) times daily.  60 tablet  12   No facility-administered medications prior to visit.    PAST MEDICAL HISTORY: Past Medical History  Diagnosis Date  . Miscarriage   . Fibrocystic breast changes   . Migraine   . GTD (gestational trophoblastic disease)   . H/O molar pregnancy, antepartum     PAST SURGICAL HISTORY: Past Surgical History  Procedure Laterality Date  . Dilation and curettage of uterus  1992, 09/2011  . Wisdom tooth extraction      in 11th grade  . Breast biopsy  2001     Left  . Dilation and evacuation  2011  . Portacath placement  11/2011  . Breast lumpectomy      FAMILY HISTORY: Family History  Problem Relation Age of Onset  . Breast cancer Mother   . Heart attack Mother   . Diabetes Father   . Breast cancer Maternal Aunt   . Breast cancer Maternal Grandmother   . Stroke Paternal Grandmother   . Breast cancer Maternal Aunt   . Alcoholism      SOCIAL HISTORY: History   Social History  . Marital Status: Married    Spouse Name: [name]    Number of Children: 2  . Years of Education: college   Occupational History  .      C[employer]-  [employer]   Social History Main Topics  . Smoking status: Never Smoker   . Smokeless tobacco: Never Used  . Alcohol Use: 0.0 oz/week     Comment: occas  . Drug Use: No  . Sexual Activity: Not Currently    Birth Control/ Protection: Pill   Other Topics Concern  . Not on file   Social History Narrative   Patient is married [name]). Patient works part time at [employer].   Right handed.   Patient has her CMA.   Caffeine- None              PHYSICAL EXAM  Filed Vitals:   07/05/13 0848  BP: 103/69  Pulse: 69  Height: 5\' 3"  (1.6 m)  Weight: 149 lb (67.586 kg)   Body mass index is 26.4 kg/(m^2).  Generalized: Well developed, in no acute distress  Head: normocephalic and atraumatic,. Oropharynx benign  Neck: Supple, no carotid bruits  Cardiac: Regular rate rhythm, no murmur  Musculoskeletal: No deformity   Neurological examination   Mentation: Alert oriented to time, place, history taking. Follows all commands speech and language fluent  Cranial nerve II-XII: Fundoscopic exam reveals sharp disc margins.Pupils were equal round reactive to light extraocular movements were full, visual field were full on confrontational test. Facial sensation and strength were normal. hearing was intact to finger rubbing bilaterally. Uvula tongue midline. head turning and shoulder shrug were normal and symmetric.Tongue  protrusion into cheek strength was normal. Motor: normal bulk and tone, full strength in the BUE, BLE,  No focal weakness Coordination: finger-nose-finger, heel-to-shin bilaterally, no dysmetria Reflexes: Brachioradialis 2/2, biceps 2/2, triceps 2/2, patellar 2/2, Achilles 2/2, plantar responses were flexor bilaterally. Gait and Station: Rising up from seated position without assistance, normal stance,  moderate stride, good arm swing, smooth turning, able to perform tiptoe, and heel walking without difficulty. Tandem gait is steady  DIAGNOSTIC DATA (LABS, IMAGING, TESTING) -   ASSESSMENT AND PLAN  48 y.o. year old female  has a past medical history of  Migraine; GTD (gestational trophoblastic disease); here to followup  Continue Relpax acutely Continue Topamax and nortriptyline as preventives Given list of migraine triggers Patient to  keep a migraine diary Followup in 3 months Dennie Bible, [facility], [facility], APRN  [facility] [address] [place], [place] [address] [phone]

## 2013-07-24 ENCOUNTER — Ambulatory Visit (INDEPENDENT_AMBULATORY_CARE_PROVIDER_SITE_OTHER): Payer: BC Managed Care – PPO | Admitting: Surgery

## 2013-07-24 ENCOUNTER — Encounter (INDEPENDENT_AMBULATORY_CARE_PROVIDER_SITE_OTHER): Payer: Self-pay | Admitting: Surgery

## 2013-07-24 VITALS — BP 126/76 | HR 68 | Temp 97.6°F | Resp 12 | Ht 62.5 in | Wt 146.2 lb

## 2013-07-24 DIAGNOSIS — M6200 Separation of muscle (nontraumatic), unspecified site: Secondary | ICD-10-CM

## 2013-07-24 DIAGNOSIS — K429 Umbilical hernia without obstruction or gangrene: Secondary | ICD-10-CM

## 2013-07-24 DIAGNOSIS — M6208 Separation of muscle (nontraumatic), other site: Secondary | ICD-10-CM

## 2013-07-24 NOTE — Patient Instructions (Signed)
Please consider the recommendations that we have given you today:  Consider laparoscopic surgery to repair the chronic hernia at your bellybutton.  See the Handout(s) we have given you.  Please call our office at [phone] if you wish to schedule surgery or if you have further questions / concerns.   HERNIA REPAIR: POST OP INSTRUCTIONS  1. DIET: Follow a light bland diet the first 24 hours after arrival home, such as soup, liquids, crackers, etc.  Be sure to include lots of fluids daily.  Avoid fast food or heavy meals as your are more likely to get nauseated.  Eat a low fat the next few days after surgery. 2. Take your usually prescribed home medications unless otherwise directed. 3. PAIN CONTROL: a. Pain is best controlled by a usual combination of three different methods TOGETHER: i. Ice/Heat ii. Over the counter pain medication iii. Prescription pain medication b. Most patients will experience some swelling and bruising around the hernia(s) such as the bellybutton, groins, or old incisions.  Ice packs or heating pads (30-60 minutes up to 6 times a day) will help. Use ice for the first few days to help decrease swelling and bruising, then switch to heat to help relax tight/sore spots and speed recovery.  Some people prefer to use ice alone, heat alone, alternating between ice & heat.  Experiment to what works for you.  Swelling and bruising can take several weeks to resolve.   c. It is helpful to take an over-the-counter pain medication regularly for the first few weeks.  Choose one of the following that works best for you: i. Naproxen (Aleve, etc)  Two 220mg  tabs twice a day ii. Ibuprofen (Advil, etc) Three 200mg  tabs four times a day (every meal & bedtime) iii. Acetaminophen (Tylenol, etc) 325-650mg  four times a day (every meal & bedtime) d. A  prescription for pain medication should be given to you upon discharge.  Take your pain medication as prescribed.  i. If you are having  problems/concerns with the prescription medicine (does not control pain, nausea, vomiting, rash, itching, etc), please call us [phone] to see if we need to switch you to a different pain medicine that will work better for you and/or control your side effect better. ii. If you need a refill on your pain medication, please contact your pharmacy.  They will contact our office to request authorization. Prescriptions will not be filled after 5 pm or on week-ends. 4. Avoid getting constipated.  Between the surgery and the pain medications, it is common to experience some constipation.  Increasing fluid intake and taking a fiber supplement (such as Metamucil, Citrucel, FiberCon, MiraLax, etc) 1-2 times a day regularly will usually help prevent this problem from occurring.  A mild laxative (prune juice, Milk of Magnesia, MiraLax, etc) should be taken according to package directions if there are no bowel movements after 48 hours.   5. Wash / shower every day.  You may shower over the dressings as they are waterproof.   6. Remove your waterproof bandages 5 days after surgery.  You may leave the incision open to air.  You may replace a dressing/Band-Aid to cover the incision for comfort if you wish.  Continue to shower over incision(s) after the dressing is off.    7. ACTIVITIES as tolerated:   a. You may resume regular (light) daily activities beginning the next day-such as daily self-care, walking, climbing stairs-gradually increasing activities as tolerated.  If you can walk 30 minutes without  difficulty, it is safe to try more intense activity such as jogging, treadmill, bicycling, low-impact aerobics, swimming, etc. b. Save the most intensive and strenuous activity for last such as sit-ups, heavy lifting, contact sports, etc  Refrain from any heavy lifting or straining until you are off narcotics for pain control.   c. DO NOT PUSH THROUGH PAIN.  Let pain be your guide: If it hurts to do something, don't  do it.  Pain is your body warning you to avoid that activity for another week until the pain goes down. d. You may drive when you are no longer taking prescription pain medication, you can comfortably wear a seatbelt, and you can safely maneuver your car and apply brakes. e. Dennis Bast may have sexual intercourse when it is comfortable.  8. FOLLOW UP in our office a. Please call [facility] at [phone] to set up an appointment to see your surgeon in the office for a follow-up appointment approximately 2-3 weeks after your surgery. b. Make sure that you call for this appointment the day you arrive home to insure a convenient appointment time. 9.  IF YOU HAVE DISABILITY OR FAMILY LEAVE FORMS, BRING THEM TO THE OFFICE FOR PROCESSING.  DO NOT GIVE THEM TO YOUR DOCTOR.  WHEN TO CALL us [phone]: 1. Poor pain control 2. Reactions / problems with new medications (rash/itching, nausea, etc)  3. Fever over 101.5 F (38.5 C) 4. Inability to urinate 5. Nausea and/or vomiting 6. Worsening swelling or bruising 7. Continued bleeding from incision. 8. Increased pain, redness, or drainage from the incision   The clinic staff is available to answer your questions during regular business hours (8:30am-5pm).  Please don't hesitate to call and ask to speak to one of our nurses for clinical concerns.   If you have a medical emergency, go to the nearest emergency room or call 911.  A surgeon from [facility] is always on call at the hospitals in [facility], [place], [place], [place], [place]  [address] ?  [address] [address], [place], [place]   [address] MAIN: [phone] ? TOLL FREE: [phone] ? FAX: [phone] [email or web address]  Managing Pain  Pain after surgery or related to activity is often due to strain/injury to muscle, tendon, nerves and/or incisions.  This pain is usually short-term and will improve in a few months.   Many people  find it helpful to do the following things TOGETHER to help speed the process of healing and to get back to regular activity more quickly:  1. Avoid heavy physical activity a.  no lifting greater than 20 pounds b. Do not "push through" the pain.  Listen to your body and avoid positions and maneuvers than reproduce the pain c. Walking is okay as tolerated, but go slowly and stop when getting sore.  d. Remember: If it hurts to do it, then don't do it! 2. Take Anti-inflammatory medication  a. Take with food/snack around the clock for 1-2 weeks i. This helps the muscle and nerve tissues become less irritable and calm down faster b. Choose ONE of the following over-the-counter medications: i. Naproxen 220mg  tabs (ex. Aleve) 1-2 pills twice a day  ii. Ibuprofen 200mg  tabs (ex. Advil, Motrin) 3-4 pills with every meal and just before bedtime iii. Acetaminophen 500mg  tabs (Tylenol) 1-2 pills with every meal and just before bedtime 3. Use a Heating pad or Ice/Cold Pack a. 4-6 times a day b. May use warm bath/hottub  or showers 4. Try Gentle Massage and/or Stretching  a. at the area of pain many times a day b. stop if you feel pain - do not overdo it  Try these steps together to help you body heal faster and avoid making things get worse.  Doing just one of these things may not be enough.    If you are not getting better after two weeks or are noticing you are getting worse, contact our office for further advice; we may need to re-evaluate you & see what other things we can do to help.

## 2013-07-24 NOTE — Progress Notes (Signed)
Subjective:     Patient ID: Kaitlin Ferrell, female   DOB: 03-Aug-1965, 48 y.o.   MRN: [id number]  HPI   Kaitlin Ferrell  May 25, 1965 [id number]  Patient Care Team: Rafaela M. Ruben Gottron, MD as PCP - General (Family Medicine) Alvino Chapel, MD as Consulting Physician (Gynecology) Gordy Levan, MD as Consulting Physician (Medical Oncology)  This patient is a 48 y.o.female who presents today for surgical evaluation at the request of Dr. Ruben Gottron.   Reason for visit: Umbilical swelling.  Probable hernia  Pleasant female.  Survivor of gestational trophoblastic disease.  Received chemotherapy late 2013.  Disease free.  Port has been out for four months.  Has noted a hernia at her bellybutton for some time.  I saw her last year.  I recommended surgery.  She is not able to do at that time.  She thinks it is maybe slightly larger.  Still slightly sensitive.  Patient rather active.  Can walk 2 miles without any difficulty.  No history of MRSA or infections.  Daily bowel movements.  No nausea or vomiting.  No history of abdominal surgeries.  Patient Active Problem List   Diagnosis Date Noted  . Umbilical hernia 29/51/8841  . Diastasis recti 08/27/2012  . Migraine headache 12/14/2011  . 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 Miscarriage   . Fibrocystic breast changes   . Migraine   . GTD (gestational trophoblastic disease) 2013    treated with 4 cycles of actinomycin D from 12-02-11 thru 01-13-12  . H/O molar pregnancy, antepartum     Past Surgical History  Procedure Laterality Date  . Dilation and curettage of uterus  1992, 09/2011  . Wisdom tooth extraction      in 11th grade  . Breast biopsy  2001    Left  . Dilation and evacuation  2011  . Portacath placement  11/2011  . Breast lumpectomy      History   Social History  . Marital Status: Married    Spouse Name: [name]    Number of Children: 2  .  Years of Education: college   Occupational History  .      C[employer]-  [employer]   Social History Main Topics  . Smoking status: Never Smoker   . Smokeless tobacco: Never Used  . Alcohol Use: 0.0 oz/week     Comment: occas  . Drug Use: No  . Sexual Activity: Not Currently    Birth Control/ Protection: Pill   Other Topics Concern  . Not on file   Social History Narrative   Patient is married [name]). Patient works part time at [employer].   Right handed.   Patient has her CMA.   Caffeine- None             Family History  Problem Relation Age of Onset  . Breast cancer Mother   . Heart attack Mother   . Diabetes Father   . Breast cancer Maternal Aunt   . Breast cancer Maternal Grandmother   . Stroke Paternal Grandmother   . Breast cancer Maternal Aunt   . Alcoholism      Current Outpatient Prescriptions  Medication Sig Dispense Refill  . acetaminophen (TYLENOL) 325 MG tablet Take 650 mg by mouth every 6 (six) hours as needed for headache (if relpax does not help).      Marland Kitchen eletriptan (RELPAX) 40 MG tablet Take 1 tablet (40 mg total) by mouth  as needed for migraine or headache. One tablet by mouth at onset of headache. May repeat in 2 hours if headache persists or recurs.  15 tablet  12  . Ibuprofen (ADVIL PO) Take by mouth as needed.      . Magnesium Oxide (MAG-OX PO) Take by mouth.      . Multiple Vitamins-Minerals (ALIVE ONCE DAILY WOMENS 50+ PO) Take by mouth daily.      . Norethindrone-Ethinyl Estradiol-Fe Biphas (LO LOESTRIN FE) 1 MG-10 MCG / 10 MCG tablet Take 1 tablet by mouth daily.  28 tablet  1  . nortriptyline (PAMELOR) 25 MG capsule One tab po qhs xone week, then 2 tabs po qhs  60 capsule  12  . OVER THE COUNTER MEDICATION Take 400 mg by mouth every 6 (six) hours as needed (For migraine headaches if Sumatriptan does not work.). Advil Liqui-Gels (Ibuprofen) 200mg .      . Probiotic Product (ALIGN) 4 MG CAPS Take by mouth.      . Riboflavin 100 MG TABS Take 1 tablet (100  mg total) by mouth 2 (two) times daily.  60 tablet  12  . topiramate (TOPAMAX) 50 MG tablet Take 1 tablet (50 mg total) by mouth 2 (two) times daily.  60 tablet  12   No current facility-administered medications for this visit.     Allergies  Allergen Reactions  . Compazine [Prochlorperazine Edisylate]     Very agitated  . Labetalol Hives, Itching and Swelling    BP 126/76  Pulse 68  Temp(Src) 97.6 F (36.4 C) (Temporal)  Resp 12  Ht 5' 2.5" (1.588 m)  Wt 146 lb 3.2 oz (66.316 kg)  BMI 26.30 kg/m2  No results found.   Review of Systems  Constitutional: Negative for fever, chills, diaphoresis, appetite change and fatigue.  HENT: Negative for ear discharge, ear pain, sore throat and trouble swallowing.   Eyes: Negative for photophobia, discharge and visual disturbance.  Respiratory: Negative for cough, choking, chest tightness and shortness of breath.   Cardiovascular: Negative for chest pain and palpitations.  Gastrointestinal: Positive for abdominal pain. Negative for nausea, vomiting, diarrhea, constipation, anal bleeding and rectal pain.  Endocrine: Negative for cold intolerance and heat intolerance.  Genitourinary: Negative for dysuria, frequency and difficulty urinating.  Musculoskeletal: Negative for gait problem, myalgias and neck pain.  Skin: Negative for color change, pallor and rash.  Allergic/Immunologic: Negative for environmental allergies, food allergies and immunocompromised state.  Neurological: Negative for dizziness, speech difficulty, weakness and numbness.  Hematological: Negative for adenopathy.  Psychiatric/Behavioral: Negative for confusion and agitation. The patient is not nervous/anxious.        Objective:   Physical Exam  Constitutional: She is oriented to person, place, and time. She appears well-developed and well-nourished. No distress.  HENT:  Head: Normocephalic.  Mouth/Throat: Oropharynx is clear and moist. No oropharyngeal exudate.    Eyes: Conjunctivae and EOM are normal. Pupils are equal, round, and reactive to light. No scleral icterus.  Neck: Normal range of motion. Neck supple. No tracheal deviation present.  Cardiovascular: Normal rate, regular rhythm and intact distal pulses.   Pulmonary/Chest: Effort normal and breath sounds normal. No stridor. No respiratory distress. She exhibits no tenderness.  Abdominal: Soft. She exhibits no distension and no mass. There is no tenderness. Hernia confirmed negative in the right inguinal area and confirmed negative in the left inguinal area.    Genitourinary: No vaginal discharge found.  Musculoskeletal: Normal range of motion. She exhibits no tenderness.  Right elbow: She exhibits normal range of motion.       Left elbow: She exhibits normal range of motion.       Right wrist: She exhibits normal range of motion.       Left wrist: She exhibits normal range of motion.       Right hand: Normal strength noted.       Left hand: Normal strength noted.  Lymphadenopathy:       Head (right side): No posterior auricular adenopathy present.       Head (left side): No posterior auricular adenopathy present.    She has no cervical adenopathy.    She has no axillary adenopathy.       Right: No inguinal adenopathy present.       Left: No inguinal adenopathy present.  Neurological: She is alert and oriented to person, place, and time. No cranial nerve deficit. She exhibits normal muscle tone. Coordination normal.  Skin: Skin is warm and dry. No rash noted. She is not diaphoretic. No erythema.  Psychiatric: She has a normal mood and affect. Her behavior is normal. Judgment and thought content normal.       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  Persistent & symptomatic.     Plan:     I think she would benefit from repair.  Given the nearby diastases and greater than 2 cm and young age; recommend mesh reinforcement.   Reasonable to do laparoscopically.  I discussed it with her:  The anatomy & physiology of the abdominal wall was discussed.  The pathophysiology of hernias was discussed.  Natural history risks without surgery including progeressive enlargement, pain, incarceration & strangulation was discussed.   Contributors to complications such as smoking, obesity, diabetes, prior surgery, etc were discussed.   I feel the risks of no intervention will lead to serious problems that outweigh the operative risks; therefore, I recommended surgery to reduce and repair the hernia.  I explained laparoscopic techniques with possible need for an open approach.  I noted the probable use of mesh to patch and/or buttress the hernia repair  Risks such as bleeding, infection, abscess, need for further treatment, heart attack, death, and other risks were discussed.  I noted a good likelihood this will help address the problem.   Goals of post-operative recovery were discussed as well.  Possibility that this will not correct all symptoms was explained.  I stressed the importance of low-impact activity, aggressive pain control, avoiding constipation, & not pushing through pain to minimize risk of post-operative chronic pain or injury. Possibility of reherniation especially with smoking, obesity, diabetes, immunosuppression, and other health conditions was discussed.  We will work to minimize complications.     An educational handout further explaining the pathology & treatment options was given as well.  Questions were answered.  The patient expresses understanding & wishes to proceed with surgery.

## 2013-08-15 ENCOUNTER — Encounter (HOSPITAL_COMMUNITY): Payer: Self-pay | Admitting: Pharmacy Technician

## 2013-08-22 ENCOUNTER — Other Ambulatory Visit (HOSPITAL_COMMUNITY): Payer: Self-pay | Admitting: *Deleted

## 2013-08-23 ENCOUNTER — Encounter (HOSPITAL_COMMUNITY)
Admission: RE | Admit: 2013-08-23 | Discharge: 2013-08-23 | Disposition: A | Discharge status: Home / Self Care | Payer: BC Managed Care – PPO | Source: Ambulatory Visit | Attending: Surgery | Admitting: Surgery

## 2013-08-23 ENCOUNTER — Encounter (INDEPENDENT_AMBULATORY_CARE_PROVIDER_SITE_OTHER): Payer: Self-pay

## 2013-08-23 ENCOUNTER — Encounter (HOSPITAL_COMMUNITY): Payer: Self-pay

## 2013-08-23 DIAGNOSIS — Z01812 Encounter for preprocedural laboratory examination: Secondary | ICD-10-CM | POA: Insufficient documentation

## 2013-08-23 DIAGNOSIS — Z01818 Encounter for other preprocedural examination: Secondary | ICD-10-CM | POA: Insufficient documentation

## 2013-08-23 LAB — CBC
HCT: 39.3 % (ref 36.0–46.0)
Hemoglobin: 12.7 g/dL (ref 12.0–15.0)
MCH: 28.3 pg (ref 26.0–34.0)
MCHC: 32.3 g/dL (ref 30.0–36.0)
MCV: 87.7 fL (ref 78.0–100.0)
Platelets: 217 10*3/uL (ref 150–400)
RBC: 4.48 MIL/uL (ref 3.87–5.11)
RDW: 13.2 % (ref 11.5–15.5)
WBC: 6.6 10*3/uL (ref 4.0–10.5)

## 2013-08-23 LAB — HCG, SERUM, QUALITATIVE: Preg, Serum: NEGATIVE

## 2013-08-23 NOTE — Patient Instructions (Addendum)
Your procedure is scheduled on 09/03/13  TUESDAY:    Report to [facility] at   [place]    AM.   Call this number if you have problems the morning of surgery: [phone]      Remember to stop vitamins,antiinflammatories and herbal medications 5 days before surgery  Do not eat food  Or drink :After Midnight.MONDAY NIGHT   Take these medicines the morning of surgery with A SIP OF WATER: TOPAMAX   .  Contacts, dentures or partial plates, or metal hairpins  can not be worn to surgery. Your family will be responsible for glasses, dentures, hearing aides while you are in surgery  Leave suitcase in the car. After surgery it may be brought to your room.  For patients admitted to the hospital, checkout time is 11:00 AM day of  discharge.         [facility] IS NOT RESPONSIBLE FOR ANY VALUABLES  Patients discharged the day of surgery will not be allowed to drive home. IF going home the day of surgery, you must have a driver and someone to stay with you for the first 24 hours  Name and phone number of your driver:  husband                                                                                                                                       [facility] - Preparing for Surgery Before surgery, you can play an important role.  Because skin is not sterile, your skin needs to be as free of germs as possible.  You can reduce the number of germs on your skin by washing with CHG (chlorahexidine gluconate) soap before surgery.  CHG is an antiseptic cleaner which kills germs and bonds with the skin to continue killing germs even after washing. Please DO NOT use if you have an allergy to CHG or antibacterial soaps.  If your skin becomes reddened/irritated stop using the CHG and inform your nurse when you arrive at Short Stay. Do not shave (including legs and underarms) for at least 48 hours prior to the first CHG shower.   You may shave your face/neck. Please follow these instructions carefully:  1.  Shower with CHG Soap the night before surgery and the  morning of Surgery.  2.  If you choose to wash your hair, wash your hair first as usual with your  normal  shampoo.  3.  After you shampoo, rinse your hair and body thoroughly to remove the  shampoo.                           4.  Use CHG as you would any other liquid soap.  You can apply chg directly  to the skin and wash  Gently with a scrungie or clean washcloth.  5.  Apply the CHG Soap to your body ONLY FROM THE NECK DOWN.   Do not use on face/ open                           Wound or open sores. Avoid contact with eyes, ears mouth and genitals (private parts).                       Wash face,  Genitals (private parts) with your normal soap.             6.  Wash thoroughly, paying special attention to the area where your surgery  will be performed.  7.  Thoroughly rinse your body with warm water from the neck down.  8.  DO NOT shower/wash with your normal soap after using and rinsing off  the CHG Soap.                9.  Pat yourself dry with a clean towel.            10.  Wear clean pajamas.            11.  Place clean sheets on your bed the night of your first shower and do not  sleep with pets. Day of Surgery : Do not apply any lotions/deodorants the morning of surgery.  Please wear clean clothes to the hospital/surgery center.  FAILURE TO FOLLOW THESE INSTRUCTIONS MAY RESULT IN THE CANCELLATION OF YOUR SURGERY PATIENT SIGNATURE_________________________________  NURSE SIGNATURE__________________________________  ________________________________________________________________________

## 2013-08-28 ENCOUNTER — Telehealth: Payer: Self-pay | Admitting: Nurse Practitioner

## 2013-08-28 NOTE — Telephone Encounter (Signed)
Patient scheduled for surgery on 7/28 and was informed to discontinue use of  Pyridoxine HCl (VITAMIN B-6 PO) and magnesium oxide (MAG-OX) 400 MG tablet 5 days prior to surgery.  Please call and advise.  Thanks

## 2013-08-28 NOTE — Telephone Encounter (Signed)
I called back, got no answer.   

## 2013-08-28 NOTE — Telephone Encounter (Signed)
Patient  Retuning your call, stated she's in grocery store and will look out for your return call.  thanks

## 2013-08-28 NOTE — Telephone Encounter (Signed)
I called back to clarify info, got no answer.  Left message.

## 2013-08-28 NOTE — Telephone Encounter (Signed)
I called again, got no answer.   

## 2013-08-29 NOTE — Telephone Encounter (Signed)
Called back again, got no answer.

## 2013-08-29 NOTE — Telephone Encounter (Signed)
Tried to reach the patient again, got no answer.

## 2013-08-29 NOTE — Telephone Encounter (Signed)
Called again, got no answer.

## 2013-08-30 NOTE — Telephone Encounter (Signed)
I called again.  Got no answer.  I am unable to reach the patient.

## 2013-09-03 ENCOUNTER — Encounter (HOSPITAL_COMMUNITY): Payer: Self-pay | Admitting: *Deleted

## 2013-09-03 ENCOUNTER — Ambulatory Visit (HOSPITAL_COMMUNITY): Payer: BC Managed Care – PPO | Admitting: Certified Registered"

## 2013-09-03 ENCOUNTER — Encounter (HOSPITAL_COMMUNITY)
Admission: RE | Disposition: A | Discharge status: Home / Self Care | Payer: Self-pay | Source: Ambulatory Visit | Attending: Surgery

## 2013-09-03 ENCOUNTER — Encounter (HOSPITAL_COMMUNITY): Payer: BC Managed Care – PPO | Admitting: Certified Registered"

## 2013-09-03 ENCOUNTER — Ambulatory Visit (HOSPITAL_COMMUNITY)
Admission: RE | Admit: 2013-09-03 | Discharge: 2013-09-03 | Disposition: A | Discharge status: Home / Self Care | Payer: BC Managed Care – PPO | Source: Ambulatory Visit | Attending: Surgery | Admitting: Surgery

## 2013-09-03 DIAGNOSIS — M6208 Separation of muscle (nontraumatic), other site: Secondary | ICD-10-CM

## 2013-09-03 DIAGNOSIS — G709 Myoneural disorder, unspecified: Secondary | ICD-10-CM | POA: Insufficient documentation

## 2013-09-03 DIAGNOSIS — G43909 Migraine, unspecified, not intractable, without status migrainosus: Secondary | ICD-10-CM | POA: Insufficient documentation

## 2013-09-03 DIAGNOSIS — K429 Umbilical hernia without obstruction or gangrene: Secondary | ICD-10-CM | POA: Insufficient documentation

## 2013-09-03 DIAGNOSIS — M6200 Separation of muscle (nontraumatic), unspecified site: Secondary | ICD-10-CM | POA: Insufficient documentation

## 2013-09-03 DIAGNOSIS — N6019 Diffuse cystic mastopathy of unspecified breast: Secondary | ICD-10-CM | POA: Insufficient documentation

## 2013-09-03 DIAGNOSIS — K439 Ventral hernia without obstruction or gangrene: Secondary | ICD-10-CM

## 2013-09-03 HISTORY — PX: INSERTION OF MESH: SHX5868

## 2013-09-03 HISTORY — PX: VENTRAL HERNIA REPAIR: SHX424

## 2013-09-03 HISTORY — DX: Malignant (primary) neoplasm, unspecified: C80.1

## 2013-09-03 SURGERY — REPAIR, HERNIA, VENTRAL, LAPAROSCOPIC
Anesthesia: General | Site: Abdomen

## 2013-09-03 MED ORDER — GLYCOPYRROLATE 0.2 MG/ML IJ SOLN
INTRAMUSCULAR | Status: DC | PRN
  Administered 2013-09-03: 0.6 mg via INTRAVENOUS

## 2013-09-03 MED ORDER — BUPIVACAINE-EPINEPHRINE 0.25% -1:200000 IJ SOLN
INTRAMUSCULAR | Status: DC | PRN
  Administered 2013-09-03: 60 mL

## 2013-09-03 MED ORDER — FENTANYL CITRATE 0.05 MG/ML IJ SOLN
INTRAMUSCULAR | Status: DC | PRN
  Administered 2013-09-03: 100 ug via INTRAVENOUS
  Administered 2013-09-03 (×3): 50 ug via INTRAVENOUS

## 2013-09-03 MED ORDER — MENTHOL 3 MG MT LOZG: 1.0000 | LOZENGE | OROMUCOSAL | Status: DC | PRN

## 2013-09-03 MED ORDER — ALUM & MAG HYDROXIDE-SIMETH 200-200-20 MG/5ML PO SUSP: 30.0000 mL | Freq: Four times a day (QID) | ORAL | Status: DC | PRN

## 2013-09-03 MED ORDER — ROCURONIUM BROMIDE 100 MG/10ML IV SOLN
INTRAVENOUS | Status: AC
  Filled 2013-09-03: qty 1

## 2013-09-03 MED ORDER — METOCLOPRAMIDE HCL 5 MG/ML IJ SOLN: 10.0000 mg | Freq: Once | INTRAMUSCULAR | Status: DC | PRN

## 2013-09-03 MED ORDER — HYDROMORPHONE HCL PF 2 MG/ML IJ SOLN
INTRAMUSCULAR | Status: AC
  Filled 2013-09-03: qty 1

## 2013-09-03 MED ORDER — 0.9 % SODIUM CHLORIDE (POUR BTL) OPTIME
TOPICAL | Status: DC | PRN
  Administered 2013-09-03: 1000 mL

## 2013-09-03 MED ORDER — KETOROLAC TROMETHAMINE 30 MG/ML IJ SOLN
INTRAMUSCULAR | Status: DC | PRN
  Administered 2013-09-03: 30 mg via INTRAVENOUS

## 2013-09-03 MED ORDER — METHOCARBAMOL 1000 MG/10ML IJ SOLN: 500.0000 mg | Freq: Four times a day (QID) | INTRAVENOUS | Status: DC | PRN

## 2013-09-03 MED ORDER — STERILE WATER FOR IRRIGATION IR SOLN
Status: DC | PRN
  Administered 2013-09-03: 1500 mL

## 2013-09-03 MED ORDER — MAGIC MOUTHWASH: 15.0000 mL | Freq: Four times a day (QID) | ORAL | Status: DC | PRN

## 2013-09-03 MED ORDER — MIDAZOLAM HCL 5 MG/5ML IJ SOLN
INTRAMUSCULAR | Status: DC | PRN
  Administered 2013-09-03: 2 mg via INTRAVENOUS

## 2013-09-03 MED ORDER — PROPOFOL 10 MG/ML IV BOLUS
INTRAVENOUS | Status: AC
  Filled 2013-09-03: qty 20

## 2013-09-03 MED ORDER — ACETAMINOPHEN 650 MG RE SUPP
650.0000 mg | Freq: Four times a day (QID) | RECTAL | Status: DC | PRN
  Filled 2013-09-03: qty 1

## 2013-09-03 MED ORDER — PHENOL 1.4 % MT LIQD: 2.0000 | OROMUCOSAL | Status: DC | PRN

## 2013-09-03 MED ORDER — CEFAZOLIN SODIUM-DEXTROSE 2-3 GM-% IV SOLR
2.0000 g | INTRAVENOUS | Status: AC
  Administered 2013-09-03: 2 g via INTRAVENOUS

## 2013-09-03 MED ORDER — BUPIVACAINE-EPINEPHRINE (PF) 0.25% -1:200000 IJ SOLN
INTRAMUSCULAR | Status: AC
  Filled 2013-09-03: qty 30

## 2013-09-03 MED ORDER — ONDANSETRON HCL 4 MG/2ML IJ SOLN: 4.0000 mg | Freq: Four times a day (QID) | INTRAMUSCULAR | Status: DC | PRN

## 2013-09-03 MED ORDER — PROMETHAZINE HCL 25 MG/ML IJ SOLN: 6.2500 mg | Freq: Four times a day (QID) | INTRAMUSCULAR | Status: DC | PRN

## 2013-09-03 MED ORDER — OXYCODONE HCL 5 MG PO TABS: 5.0000 mg | ORAL_TABLET | ORAL | Status: DC | PRN

## 2013-09-03 MED ORDER — KETOROLAC TROMETHAMINE 30 MG/ML IJ SOLN
INTRAMUSCULAR | Status: AC
  Filled 2013-09-03: qty 1

## 2013-09-03 MED ORDER — PROPOFOL 10 MG/ML IV BOLUS
INTRAVENOUS | Status: DC | PRN
  Administered 2013-09-03: 180 mg via INTRAVENOUS

## 2013-09-03 MED ORDER — LIDOCAINE HCL (CARDIAC) 20 MG/ML IV SOLN
INTRAVENOUS | Status: AC
  Filled 2013-09-03: qty 5

## 2013-09-03 MED ORDER — HYDROMORPHONE HCL PF 1 MG/ML IJ SOLN
INTRAMUSCULAR | Status: DC | PRN
  Administered 2013-09-03 (×2): 1 mg via INTRAVENOUS

## 2013-09-03 MED ORDER — NEOSTIGMINE METHYLSULFATE 10 MG/10ML IV SOLN
INTRAVENOUS | Status: DC | PRN
  Administered 2013-09-03: 4 mg via INTRAVENOUS

## 2013-09-03 MED ORDER — MIDAZOLAM HCL 2 MG/2ML IJ SOLN
INTRAMUSCULAR | Status: AC
  Filled 2013-09-03: qty 2

## 2013-09-03 MED ORDER — HYDROMORPHONE HCL PF 1 MG/ML IJ SOLN: 0.5000 mg | INTRAMUSCULAR | Status: DC | PRN

## 2013-09-03 MED ORDER — ACETAMINOPHEN 325 MG PO TABS: 325.0000 mg | ORAL_TABLET | Freq: Four times a day (QID) | ORAL | Status: DC | PRN

## 2013-09-03 MED ORDER — ROCURONIUM BROMIDE 100 MG/10ML IV SOLN
INTRAVENOUS | Status: DC | PRN
  Administered 2013-09-03: 5 mg via INTRAVENOUS
  Administered 2013-09-03: 25 mg via INTRAVENOUS

## 2013-09-03 MED ORDER — LIDOCAINE HCL (CARDIAC) 20 MG/ML IV SOLN
INTRAVENOUS | Status: DC | PRN
  Administered 2013-09-03: 50 mg via INTRAVENOUS

## 2013-09-03 MED ORDER — LIP MEDEX EX OINT: 1.0000 "application " | TOPICAL_OINTMENT | Freq: Two times a day (BID) | CUTANEOUS | Status: DC

## 2013-09-03 MED ORDER — BUPIVACAINE-EPINEPHRINE 0.25% -1:200000 IJ SOLN
INTRAMUSCULAR | Status: AC
  Filled 2013-09-03: qty 1

## 2013-09-03 MED ORDER — SUCCINYLCHOLINE CHLORIDE 20 MG/ML IJ SOLN
INTRAMUSCULAR | Status: DC | PRN
  Administered 2013-09-03: 100 mg via INTRAVENOUS

## 2013-09-03 MED ORDER — CEFAZOLIN SODIUM-DEXTROSE 2-3 GM-% IV SOLR
INTRAVENOUS | Status: AC
Start: 2013-09-03 — End: 2013-09-03
  Filled 2013-09-03: qty 50

## 2013-09-03 MED ORDER — ONDANSETRON HCL 4 MG/2ML IJ SOLN
INTRAMUSCULAR | Status: DC | PRN
  Administered 2013-09-03: 4 mg via INTRAVENOUS

## 2013-09-03 MED ORDER — LACTATED RINGERS IV SOLN: INTRAVENOUS | Status: DC

## 2013-09-03 MED ORDER — ONDANSETRON 8 MG/NS 50 ML IVPB: 8.0000 mg | Freq: Four times a day (QID) | INTRAVENOUS | Status: DC | PRN

## 2013-09-03 MED ORDER — GLYCOPYRROLATE 0.2 MG/ML IJ SOLN
INTRAMUSCULAR | Status: AC
  Filled 2013-09-03: qty 3

## 2013-09-03 MED ORDER — DIPHENHYDRAMINE HCL 50 MG/ML IJ SOLN: 12.5000 mg | Freq: Four times a day (QID) | INTRAMUSCULAR | Status: DC | PRN

## 2013-09-03 MED ORDER — ONDANSETRON HCL 4 MG/2ML IJ SOLN
INTRAMUSCULAR | Status: AC
  Filled 2013-09-03: qty 2

## 2013-09-03 MED ORDER — FENTANYL CITRATE 0.05 MG/ML IJ SOLN
INTRAMUSCULAR | Status: AC
  Filled 2013-09-03: qty 5

## 2013-09-03 MED ORDER — ONDANSETRON 4 MG PO TBDP: 4.0000 mg | ORAL_TABLET | Freq: Four times a day (QID) | ORAL | Status: DC | PRN

## 2013-09-03 MED ORDER — LACTATED RINGERS IV SOLN
INTRAVENOUS | Status: DC | PRN
  Administered 2013-09-03 (×3): via INTRAVENOUS

## 2013-09-03 MED ORDER — DEXAMETHASONE SODIUM PHOSPHATE 10 MG/ML IJ SOLN
INTRAMUSCULAR | Status: AC
  Filled 2013-09-03: qty 1

## 2013-09-03 MED ORDER — DEXAMETHASONE SODIUM PHOSPHATE 10 MG/ML IJ SOLN
INTRAMUSCULAR | Status: DC | PRN
  Administered 2013-09-03: 10 mg via INTRAVENOUS

## 2013-09-03 MED ORDER — FENTANYL CITRATE 0.05 MG/ML IJ SOLN: 25.0000 ug | INTRAMUSCULAR | Status: DC | PRN

## 2013-09-03 MED ORDER — NEOSTIGMINE METHYLSULFATE 10 MG/10ML IV SOLN
INTRAVENOUS | Status: AC
  Filled 2013-09-03: qty 1

## 2013-09-03 SURGICAL SUPPLY — 56 items
APPLIER CLIP 5 13 M/L LIGAMAX5 (MISCELLANEOUS)
APR CLP MED LRG 5 ANG JAW (MISCELLANEOUS)
BINDER ABDOMINAL 12 ML 46-62 (SOFTGOODS) ×1 IMPLANT
BLADE HEX COATED 2.75 (ELECTRODE) ×1 IMPLANT
CABLE HI FREQUENCY MONOPOLAR (ELECTROSURGICAL) ×1 IMPLANT
CANISTER SUCTION 2500CC (MISCELLANEOUS) ×1 IMPLANT
CATH KIT ON Q 7.5IN SLV (PAIN MANAGEMENT) IMPLANT
CHLORAPREP W/TINT 26ML (MISCELLANEOUS) ×2 IMPLANT
CLIP APPLIE 5 13 M/L LIGAMAX5 (MISCELLANEOUS) IMPLANT
DECANTER SPIKE VIAL GLASS SM (MISCELLANEOUS) ×1 IMPLANT
DEVICE RELIATACK FIXATION (MISCELLANEOUS) ×1 IMPLANT
DEVICE SECURE STRAP 25 ABSORB (INSTRUMENTS) IMPLANT
DEVICE TROCAR PUNCTURE CLOSURE (ENDOMECHANICALS) ×2 IMPLANT
DRAPE LAPAROSCOPIC ABDOMINAL (DRAPES) ×2 IMPLANT
DRAPE UTILITY XL STRL (DRAPES) ×2 IMPLANT
DRAPE WARM FLUID 44X44 (DRAPE) ×2 IMPLANT
DRSG TEGADERM 2-3/8X2-3/4 SM (GAUZE/BANDAGES/DRESSINGS) ×4 IMPLANT
DRSG TEGADERM 4X4.75 (GAUZE/BANDAGES/DRESSINGS) ×1 IMPLANT
ELECT REM PT RETURN 9FT ADLT (ELECTROSURGICAL) ×2
ELECTRODE REM PT RTRN 9FT ADLT (ELECTROSURGICAL) ×1 IMPLANT
GAUZE SPONGE 2X2 8PLY STRL LF (GAUZE/BANDAGES/DRESSINGS) IMPLANT
GLOVE BIOGEL PI IND STRL 6.5 (GLOVE) IMPLANT
GLOVE BIOGEL PI IND STRL 7.0 (GLOVE) IMPLANT
GLOVE BIOGEL PI INDICATOR 6.5 (GLOVE) ×1
GLOVE BIOGEL PI INDICATOR 7.0 (GLOVE) ×1
GLOVE ECLIPSE 7.0 STRL STRAW (GLOVE) ×1 IMPLANT
GLOVE ECLIPSE 8.0 STRL XLNG CF (GLOVE) ×2 IMPLANT
GLOVE INDICATOR 8.0 STRL GRN (GLOVE) ×2 IMPLANT
GLOVE SURG SS PI 6.5 STRL IVOR (GLOVE) ×2 IMPLANT
GOWN STRL REUS W/TWL XL LVL3 (GOWN DISPOSABLE) ×5 IMPLANT
KIT BASIN OR (CUSTOM PROCEDURE TRAY) ×2 IMPLANT
MARKER SKIN DUAL TIP RULER LAB (MISCELLANEOUS) ×2 IMPLANT
MESH VENTRALIGHT ST 6X8 (Mesh Specialty) ×2 IMPLANT
MESH VENTRLGHT ELLIPSE 8X6XMFL (Mesh Specialty) IMPLANT
NDL SPNL 22GX3.5 QUINCKE BK (NEEDLE) IMPLANT
NEEDLE SPNL 22GX3.5 QUINCKE BK (NEEDLE) ×2 IMPLANT
PENCIL BUTTON HOLSTER BLD 10FT (ELECTRODE) ×1 IMPLANT
RELOAD ENDO RELIATCK 5 HERNIA (MISCELLANEOUS) IMPLANT
RELOAD RELIATACK 5 (MISCELLANEOUS) ×4 IMPLANT
SCISSORS LAP 5X35 DISP (ENDOMECHANICALS) ×2 IMPLANT
SET IRRIG TUBING LAPAROSCOPIC (IRRIGATION / IRRIGATOR) ×1 IMPLANT
SHEARS HARMONIC ACE PLUS 36CM (ENDOMECHANICALS) IMPLANT
SLEEVE XCEL OPT CAN 5 100 (ENDOMECHANICALS) ×4 IMPLANT
SPONGE GAUZE 2X2 STER 10/PKG (GAUZE/BANDAGES/DRESSINGS) ×1
STRIP CLOSURE SKIN 1/2X4 (GAUZE/BANDAGES/DRESSINGS) ×4 IMPLANT
SUT MNCRL AB 4-0 PS2 18 (SUTURE) ×2 IMPLANT
SUT PDS AB 0 CT1 36 (SUTURE) ×2 IMPLANT
SUT PROLENE 1 CT 1 30 (SUTURE) ×10 IMPLANT
TOWEL OR 17X26 10 PK STRL BLUE (TOWEL DISPOSABLE) ×2 IMPLANT
TOWEL OR NON WOVEN STRL DISP B (DISPOSABLE) ×2 IMPLANT
TRAY FOLEY CATH 14FRSI W/METER (CATHETERS) IMPLANT
TRAY LAP CHOLE (CUSTOM PROCEDURE TRAY) ×2 IMPLANT
TROCAR BLADELESS OPT 5 100 (ENDOMECHANICALS) ×2 IMPLANT
TROCAR XCEL NON-BLD 11X100MML (ENDOMECHANICALS) IMPLANT
TUBING INSUFFLATION 10FT LAP (TUBING) ×2 IMPLANT
TUNNELER SHEATH ON-Q 16GX12 DP (PAIN MANAGEMENT) IMPLANT

## 2013-09-03 NOTE — H&P (Signed)
[facility]  [address] [place], [place] [address] Phone: [phone] FAX: [phone]     Kaitlin Ferrell  07-02-1965 [id number]  CARE TEAM:  PCP: Robyne Peers., MD  Outpatient Care Team: Patient Care Team: [place]. Ruben Gottron, MD as PCP - General (Family Medicine) Alvino Chapel, MD as Consulting Physician (Gynecology) Gordy Levan, MD as Consulting Physician (Medical Oncology)  Inpatient Treatment Team: Treatment Team: Attending Provider: Adin Hector, MD  This patient is a 48 y.o.female who presents today for surgical evaluation.   Reason for evaluation: Periumbilical hernia  Pleasant female. 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 Disease free. Port has been out for four months. Has noted a hernia at her bellybutton for some time. I saw her last year. I recommended surgery. She is not able to do at that time. She thinks it is maybe slightly larger. Still slightly sensitive.   Patient rather active. Can walk 2 miles without any difficulty. No history of MRSA or infections. Daily bowel movements. No nausea or vomiting. No history of abdominal surgeries. No new events   Past Medical History  Diagnosis Date  . Miscarriage   . Fibrocystic breast changes   . Migraine   . GTD (gestational trophoblastic disease) 2013    treated with 4 cycles of actinomycin D from 12-02-11 thru 01-13-12  . H/O molar pregnancy, antepartum   . 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 Dilation and curettage of uterus  1992, 09/2011  . Wisdom tooth extraction      in 11th grade  . Breast biopsy  2001    Left  . Dilation and evacuation  2011  . Portacath placement  11/2011  . Breast lumpectomy    . Portacath placement      REMOVAL  MARCH 2014    History   Social History  . Marital Status: Married    Spouse  Name: [name]    Number of Children: 2  . Years of Education: college   Occupational History  .      CMA-  [employer]   Social History Main Topics  . Smoking status: Never Smoker   . Smokeless tobacco: Never Used  . Alcohol Use: 0.0 oz/week     Comment: occas  . Drug Use: No  . Sexual Activity: Not Currently    Birth Control/ Protection: Pill   Other Topics Concern  . Not on file   Social History Narrative   Patient is married [name]). Patient works part time at [employer].   Right handed.   Patient has her CMA.   Caffeine- None             Family History  Problem Relation Age of Onset  . Breast cancer Mother   . Heart attack Mother   . Diabetes Father   . Breast cancer Maternal Aunt   . Breast cancer Maternal Grandmother   . Stroke Paternal Grandmother   . Breast cancer Maternal Aunt   . Alcoholism      Current Facility-Administered Medications  Medication Dose Route Frequency Provider Last Rate Last Dose  . ceFAZolin (ANCEF) IVPB 2 g/50 mL premix  2 g Intravenous On Call to OR Adin Hector, MD       Facility-Administered Medications Ordered in Other Encounters  Medication Dose Route Frequency Provider Last Rate Last Dose  . lactated ringers  infusion    Continuous PRN Noralyn Pick, CRNA         Allergies  Allergen Reactions  . Compazine [Prochlorperazine Edisylate]     Very agitated  . Labetalol Hives, Itching and Swelling    ROS: Constitutional:  No fevers, chills, sweats.  Weight stable Eyes:  No vision changes, No discharge HENT:  No sore throats, nasal drainage Lymph: No neck swelling, No bruising easily Pulmonary:  No cough, productive sputum CV: No orthopnea, PND    No exertional chest/neck/shoulder/arm pain. GI:  No personal nor family history of GI/colon cancer, inflammatory bowel disease, irritable bowel syndrome, allergy such as Celiac Sprue, dietary/dairy problems, colitis, ulcers nor gastritis.  No recent sick contacts/gastroenteritis.  No  travel outside the country.  No changes in diet. Renal: No UTIs, No hematuria Genital:  No drainage, bleeding, masses Musculoskeletal: No severe joint pain.  Good ROM major joints Skin:  No sores or lesions.  No rashes Heme/Lymph:  No easy bleeding.  No swollen lymph nodes Neuro: No focal weakness/numbness.  No seizures Psych: No suicidal ideation.  No hallucinations  BP 125/73  Pulse 101  Temp(Src) 98.8 F (37.1 C) (Oral)  Resp 18  Ht 5' 2.75" (1.594 m)  Wt 151 lb (68.493 kg)  BMI 26.96 kg/m2  SpO2 100%  LMP 06/26/2013  Physical Exam: General: Pt awake/alert/oriented x4 in no major acute distress Eyes: PERRL, normal EOM. Sclera nonicteric Neuro: CN II-XII intact w/o focal sensory/motor deficits. Lymph: No head/neck/groin lymphadenopathy Psych:  No delerium/psychosis/paranoia HENT: Normocephalic, Mucus membranes moist.  No thrush Neck: Supple, No tracheal deviation Chest: No pain.  Good respiratory excursion. CV:  Pulses intact.  Regular rhythm Abdomen: Soft, Nondistended.  Nontender.  Periumbilical hernia w diastasis recti Ext:  SCDs BLE.  No significant edema.  No cyanosis Skin: No petechiae / purpurea.  No major sores Musculoskeletal: No severe joint pain.  Good ROM major joints   Results:   Labs: No results found for this or any previous visit (from the past 48 hour(s)).  Imaging / Studies: No results found.  Medications / Allergies: per chart  Antibiotics: Anti-infectives   Start     Dose/Rate Route Frequency Ordered Stop   09/03/13 0550  ceFAZolin (ANCEF) IVPB 2 g/50 mL premix     2 g 100 mL/hr over 30 Minutes Intravenous On call to O.R. 09/03/13 0550 09/04/13 0559      Assessment  Kaitlin Ferrell  48 y.o. female  Day of Surgery  Procedure(s): [place] AVAILABLE IN ROOM  Problem List:  Active Problems:   * No active hospital problems. *   Umb [place]  Plan:  Lap repair:  The anatomy & physiology of the  abdominal wall and pelvic floor was discussed.  The pathophysiology of hernias in the inguinal and pelvic region was discussed.  Natural history risks such as progressive enlargement, pain, incarceration & strangulation was discussed.   Contributors to complications such as smoking, obesity, diabetes, prior surgery, etc were discussed.    I feel the risks of no intervention will lead to serious problems that outweigh the operative risks; therefore, I recommended surgery to reduce and repair the hernia.  I explained laparoscopic techniques with possible need for an open approach.  I noted usual use of mesh to patch and/or buttress hernia repair  Risks such as bleeding, infection, abscess, need for further treatment, heart attack, death, and other risks were discussed.  I noted a good likelihood this will help address  the problem.   Goals of post-operative recovery were discussed as well.  Possibility that this will not correct all symptoms was explained.  I stressed the importance of low-impact activity, aggressive pain control, avoiding constipation, & not pushing through pain to minimize risk of post-operative chronic pain or injury. Possibility of reherniation was discussed.  We will work to minimize complications.     An educational handout further explaining the pathology & treatment options was given as well.  Questions were answered.  The patient expresses understanding & wishes to proceed with surgery.    -VTE prophylaxis- SCDs, etc -mobilize as tolerated to help recovery    Adin Hector, M.D., F.A.C.S. Gastrointestinal and Minimally Invasive Surgery [facility] [address] [place], [place] [address] [phone] Main / Paging   09/03/2013  Note: Portions of this report may have been transcribed using voice recognition software. Every effort was made to ensure accuracy; however, inadvertent computerized transcription errors may be present.    Any transcriptional errors that result from this process are unintentional.

## 2013-09-03 NOTE — Anesthesia Postprocedure Evaluation (Signed)
  Anesthesia Post-op Note  Patient: Kaitlin Ferrell  Procedure(s) Performed: Procedure(s) (LRB): LAPAROSCOPIC VENTRAL WALL HERNIA REPAIR (N/A) INSERTION OF MESH (N/A)  Patient Location: PACU  Anesthesia Type: General  Level of Consciousness: awake and alert   Airway and Oxygen Therapy: Patient Spontanous Breathing  Post-op Pain: mild  Post-op Assessment: Post-op Vital signs reviewed, Patient's Cardiovascular Status Stable, Respiratory Function Stable, Patent Airway and No signs of Nausea or vomiting  Last Vitals:  Filed Vitals:   09/03/13 1132  BP: 116/69  Pulse: 75  Temp:   Resp: 16    Post-op Vital Signs: stable   Complications: No apparent anesthesia complications

## 2013-09-03 NOTE — Transfer of Care (Signed)
Immediate Anesthesia Transfer of Care Note  Patient: Kaitlin Ferrell  Procedure(s) Performed: Procedure(s): LAPAROSCOPIC VENTRAL WALL HERNIA REPAIR (N/A) INSERTION OF MESH (N/A)  Patient Location: PACU  Anesthesia Type:General  Level of Consciousness: awake, alert  and oriented  Airway & Oxygen Therapy: Patient Spontanous Breathing and Patient connected to face mask oxygen  Post-op Assessment: Report given to PACU RN and Post -op Vital signs reviewed and stable  Post vital signs: Reviewed and stable  Complications: No apparent anesthesia complications

## 2013-09-03 NOTE — Discharge Instructions (Signed)
HERNIA REPAIR: POST OP INSTRUCTIONS ° °1. DIET: Follow a light bland diet the first 24 hours after arrival home, such as soup, liquids, crackers, etc.  Be sure to include lots of fluids daily.  Avoid fast food or heavy meals as your are more likely to get nauseated.  Eat a low fat the next few days after surgery. °2. Take your usually prescribed home medications unless otherwise directed. °3. PAIN CONTROL: °a. Pain is best controlled by a usual combination of three different methods TOGETHER: °i. Ice/Heat °ii. Over the counter pain medication °iii. Prescription pain medication °b. Most patients will experience some swelling and bruising around the hernia(s) such as the bellybutton, groins, or old incisions.  Ice packs or heating pads (30-60 minutes up to 6 times a day) will help. Use ice for the first few days to help decrease swelling and bruising, then switch to heat to help relax tight/sore spots and speed recovery.  Some people prefer to use ice alone, heat alone, alternating between ice & heat.  Experiment to what works for you.  Swelling and bruising can take several weeks to resolve.   °c. It is helpful to take an over-the-counter pain medication regularly for the first few weeks.  Choose one of the following that works best for you: °i. Naproxen (Aleve, etc)  Two 220mg tabs twice a day °ii. Ibuprofen (Advil, etc) Three 200mg tabs four times a day (every meal & bedtime) °iii. Acetaminophen (Tylenol, etc) 325-650mg four times a day (every meal & bedtime) °d. A  prescription for pain medication should be given to you upon discharge.  Take your pain medication as prescribed.  °i. If you are having problems/concerns with the prescription medicine (does not control pain, nausea, vomiting, rash, itching, etc), please call us [phone] to see if we need to switch you to a different pain medicine that will work better for you and/or control your side effect better. °ii. If you need a refill on your pain  medication, please contact your pharmacy.  They will contact our office to request authorization. Prescriptions will not be filled after 5 pm or on week-ends. °4. Avoid getting constipated.  Between the surgery and the pain medications, it is common to experience some constipation.  Increasing fluid intake and taking a fiber supplement (such as Metamucil, Citrucel, FiberCon, MiraLax, etc) 1-2 times a day regularly will usually help prevent this problem from occurring.  A mild laxative (prune juice, Milk of Magnesia, MiraLax, etc) should be taken according to package directions if there are no bowel movements after 48 hours.   °5. Wash / shower every day.  You may shower over the dressings as they are waterproof.   °6. Remove your waterproof bandages 5 days after surgery.  You may leave the incision open to air.  You may replace a dressing/Band-Aid to cover the incision for comfort if you wish.  Continue to shower over incision(s) after the dressing is off. ° ° ° °7. ACTIVITIES as tolerated:   °a. You may resume regular (light) daily activities beginning the next day--such as daily self-care, walking, climbing stairs--gradually increasing activities as tolerated.  If you can walk 30 minutes without difficulty, it is safe to try more intense activity such as jogging, treadmill, bicycling, low-impact aerobics, swimming, etc. °b. Save the most intensive and strenuous activity for last such as sit-ups, heavy lifting, contact sports, etc  Refrain from any heavy lifting or straining until you are off narcotics for pain control.   °  c. DO NOT PUSH THROUGH PAIN.  Let pain be your guide: If it hurts to do something, don't do it.  Pain is your body warning you to avoid that activity for another week until the pain goes down. d. You may drive when you are no longer taking prescription pain medication, you can comfortably wear a seatbelt, and you can safely maneuver your car and apply brakes. e. Dennis Bast may have sexual intercourse  when it is comfortable.  8. FOLLOW UP in our office a. Please call [facility] at [phone] to set up an appointment to see your surgeon in the office for a follow-up appointment approximately 2-3 weeks after your surgery. b. Make sure that you call for this appointment the day you arrive home to insure a convenient appointment time. 9.  IF YOU HAVE DISABILITY OR FAMILY LEAVE FORMS, BRING THEM TO THE OFFICE FOR PROCESSING.  DO NOT GIVE THEM TO YOUR DOCTOR.  WHEN TO CALL us [phone]: 1. Poor pain control 2. Reactions / problems with new medications (rash/itching, nausea, etc)  3. Fever over 101.5 F (38.5 C) 4. Inability to urinate 5. Nausea and/or vomiting 6. Worsening swelling or bruising 7. Continued bleeding from incision. 8. Increased pain, redness, or drainage from the incision   The clinic staff is available to answer your questions during regular business hours (8:30am-5pm).  Please dont hesitate to call and ask to speak to one of our nurses for clinical concerns.   If you have a medical emergency, go to the nearest emergency room or call 911.  A surgeon from [facility] is always on call at the hospitals in [facility], [place], [place], [place], [place]  [address] ?  [address] [address], [place], [place]   [address] MAIN: [phone] ? TOLL FREE: [phone] ? FAX: [phone] [email or web address]  Hernia A hernia occurs when an internal organ pushes out through a weak spot in the abdominal wall. Hernias most commonly occur in the groin and around the navel. Hernias often can be pushed back into place (reduced). Most hernias tend to get worse over time. Some abdominal hernias can get stuck in the opening (irreducible or incarcerated hernia) and cannot be reduced. An irreducible abdominal hernia which is tightly squeezed into the opening is at risk for impaired blood supply (strangulated hernia). A strangulated hernia  is a medical emergency. Because of the risk for an irreducible or strangulated hernia, surgery may be recommended to repair a hernia. CAUSES   Heavy lifting.  Prolonged coughing.  Straining to have a bowel movement.  A cut (incision) made during an abdominal surgery. HOME CARE INSTRUCTIONS   Bed rest is not required. You may continue your normal activities.  Avoid lifting more than 10 pounds (4.5 kg) or straining.  Cough gently. If you are a smoker it is best to stop. Even the best hernia repair can break down with the continual strain of coughing. Even if you do not have your hernia repaired, a cough will continue to aggravate the problem.  Do not wear anything tight over your hernia. Do not try to keep it in with an outside bandage or truss. These can damage abdominal contents if they are trapped within the hernia sac.  Eat a normal diet.  Avoid constipation. Straining over long periods of time will increase hernia size and encourage breakdown of repairs. If you cannot do this with diet alone, stool softeners may be used. [place]  CARE IF:   You have a fever.  You develop increasing abdominal pain.  You feel nauseous or vomit.  Your hernia is stuck outside the abdomen, looks discolored, feels hard, or is tender.  You have any changes in your bowel habits or in the hernia that are unusual for you.  You have increased pain or swelling around the hernia.  You cannot push the hernia back in place by applying gentle pressure while lying down. MAKE SURE YOU:   Understand these instructions.  Will watch your condition.  Will get help right away if you are not doing well or get worse. Document Released: 01/24/2005 Document Revised: 04/18/2011 Document Reviewed: 09/13/2007 [facility] Patient Information 2015 [place], [place]. This information is not intended to replace advice given to you by your health care provider. Make sure you discuss any questions you have with  your health care provider.  Managing Pain  Pain after surgery or related to activity is often due to strain/injury to muscle, tendon, nerves and/or incisions.  This pain is usually short-term and will improve in a few months.   Many people find it helpful to do the following things TOGETHER to help speed the process of healing and to get back to regular activity more quickly:  1. Avoid heavy physical activity a.  no lifting greater than 20 pounds b. Do not push through the pain.  Listen to your body and avoid positions and maneuvers than reproduce the pain c. Walking is okay as tolerated, but go slowly and stop when getting sore.  d. Remember: If it hurts to do it, then dont do it! 2. Take Anti-inflammatory medication  a. Take with food/snack around the clock for 1-2 weeks i. This helps the muscle and nerve tissues become less irritable and calm down faster b. Choose ONE of the following over-the-counter medications: i. Naproxen 220mg  tabs (ex. Aleve) 1-2 pills twice a day  ii. Ibuprofen 200mg  tabs (ex. Advil, Motrin) 3-4 pills with every meal and just before bedtime iii. Acetaminophen 500mg  tabs (Tylenol) 1-2 pills with every meal and just before bedtime 3. Use a Heating pad or Ice/Cold Pack a. 4-6 times a day b. May use warm bath/hottub  or showers 4. Try Gentle Massage and/or Stretching  a. at the area of pain many times a day b. stop if you feel pain - do not overdo it  Try these steps together to help you body heal faster and avoid making things get worse.  Doing just one of these things may not be enough.    If you are not getting better after two weeks or are noticing you are getting worse, contact our office for further advice; we may need to re-evaluate you & see what other things we can do to help.

## 2013-09-03 NOTE — Anesthesia Preprocedure Evaluation (Addendum)
Anesthesia Evaluation  Patient identified by MRN, date of birth, ID band Patient awake    Reviewed: Allergy & Precautions, H&P , NPO status , Patient's Chart, lab work & pertinent test results  Airway Mallampati: II TM Distance: >3 FB Neck ROM: Full    Dental no notable dental hx.    Pulmonary neg pulmonary ROS,  breath sounds clear to auscultation  Pulmonary exam normal       Cardiovascular Exercise Tolerance: Good negative cardio ROS  Rhythm:Regular Rate:Normal     Neuro/Psych  Headaches,  Neuromuscular disease negative psych ROS   GI/Hepatic negative GI ROS, Neg liver ROS,   Endo/Other  negative endocrine ROS  Renal/GU negative Renal ROS  negative genitourinary   Musculoskeletal negative musculoskeletal ROS (+)   Abdominal   Peds negative pediatric ROS (+)  Hematology negative hematology ROS (+)   Anesthesia Other Findings   Reproductive/Obstetrics negative OB ROS                           Anesthesia Physical Anesthesia Plan  ASA: II  Anesthesia Plan: General   Post-op Pain Management:    Induction: Intravenous  Airway Management Planned: Oral ETT  Additional Equipment:   Intra-op Plan:   Post-operative Plan: Extubation in OR  Informed Consent: I have reviewed the patients History and Physical, chart, labs and discussed the procedure including the risks, benefits and alternatives for the proposed anesthesia with the patient or authorized representative who has indicated his/her understanding and acceptance.   Dental advisory given  Plan Discussed with: CRNA  Anesthesia Plan Comments:         Anesthesia Quick Evaluation

## 2013-09-03 NOTE — Op Note (Signed)
09/03/2013  9:45 AM  PATIENT:  Kaitlin Ferrell  48 y.o. female  Patient Care Team: Rafaela M. Ruben Gottron, MD as PCP - General (Family Medicine) Alvino Chapel, MD as Consulting Physician (Gynecology) Gordy Levan, MD as Consulting Physician (Medical Oncology)  PRE-OPERATIVE DIAGNOSIS:  umbilical hernia with diastasis recti  POST-OPERATIVE DIAGNOSIS:  umbilical hernia with diastasis recti  PROCEDURE:  Procedure(s): LAPAROSCOPIC VENTRAL WALL HERNIA REPAIR INSERTION OF MESH  SURGEON:  Surgeon(s): Adin Hector, MD  ASSISTANT: RN   ANESTHESIA:   local and general  EBL:  Total I/O In: 2000 [I.V.:2000] Out: -   Delay start of Pharmacological VTE agent (>24hrs) due to surgical blood loss or risk of bleeding:  no  DRAINS: none   SPECIMEN:  No Specimen  DISPOSITION OF SPECIMEN:  N/A  COUNTS:  YES  PLAN OF CARE: Discharge to home after PACU  PATIENT DISPOSITION:  PACU - hemodynamically stable.  INDICATION: Pleasant patient has developed a ventral wall abdominal hernia.   Recommendation was made for surgical repair:  The anatomy & physiology of the abdominal wall was discussed. The pathophysiology of hernias was discussed. Natural history risks without surgery including progeressive enlargement, pain, incarceration & strangulation was discussed. Contributors to complications such as smoking, obesity, diabetes, prior surgery, etc were discussed.  I feel the risks of no intervention will lead to serious problems that outweigh the operative risks; therefore, I recommended surgery to reduce and repair the hernia. I explained laparoscopic techniques with possible need for an open approach. I noted the probable use of mesh to patch and/or buttress the hernia repair  Risks such as bleeding, infection, abscess, need for further treatment, heart attack, death, and other risks were discussed. I noted a good likelihood this will help address the problem. Goals of post-operative  recovery were discussed as well. Possibility that this will not correct all symptoms was explained. I stressed the importance of low-impact activity, aggressive pain control, avoiding constipation, & not pushing through pain to minimize risk of post-operative chronic pain or injury. Possibility of reherniation especially with smoking, obesity, diabetes, immunosuppression, and other health conditions was discussed. We will work to minimize complications.  An educational handout further explaining the pathology & treatment options was given as well. Questions were answered. The patient expresses understanding & wishes to proceed with surgery.   OR FINDINGS: 4 x 4 centimeter periumbilical hernia In the setting of moderate diastasis recti..  Not incarcerated.   Type of repair - Laparoscopic underlay repair & primary repair   Name of mesh - Bard Ventralight dual sided (polypropylene / Seprafilm)  Size of mesh - Length 20 cm, Width 15 cm  Mesh overlap - 5-7 cm  Placement of mesh - Intraperitoneal underlay repair   DESCRIPTION:   Informed consent was confirmed. The patient underwent general anaesthesia without difficulty. The patient was positioned appropriately. VTE prevention in place. The patient's abdomen was clipped, prepped, & draped in a sterile fashion. Surgical timeout confirmed our plan.  The patient was positioned in reverse Trendelenburg. Abdominal entry was gained using optical entry technique in the left upper abdomen. Entry was clean. I induced carbon dioxide insufflation. Camera inspection revealed no injury. Extra ports were carefully placed under direct laparoscopic visualization.   I could see the hernia in the periumbilical abdomen abdomen.  Reduce the hernia sac and excised the preperitoneal fat.  They remove that in case.  Free the falciform ligament back towards the liver edge to expose the entire anterior abdominal wall.  I primarily used and focused cold scissors.    I made  sure hemostasis was good.  I mapped out the region using a needle passer.   To ensure that I would have at least 5 cm radial coverage outside of the hernia defect, I chose a 20x15 cm dual sided mesh.  I placed #1 Prolene stitches around its edge about every 5 cm = 12 total.  I rolled the mesh & placed into the peritoneal cavity through the Periumbilical ventral wall hernia.  I unrolled  the mesh and positioned it appropriately.  I primarily closed the periumbilical ventral hernia transversely using a recurred #1 PDS stitches to good result.  I reintroduced abdominal insufflation  I secured the mesh to cover up the hernia defect using a laparoscopic suture passer to pass the tails of the Prolene through the abdominal wall & tagged them with clamps.  I started out in four corners to make sure I had the mesh centered over the hernia defect appropriately, and then proceeded to work in quadrants.  We evacuated CO2 & desufflated the abdomen.  I tied the fascial stitches down.  I reinsufflated the abdomen.  The mesh provided at least 5-10 cm circumferential coverage around the entire region of hernia defects.   I tacked the edges & central part of the mesh to the peritoneum/posterior rectus fascia with Covidien angled absorbable tacker.   Hemostasis was excellent.  Capnoperitoneum was evacuated. Ports were removed. The skin was closed with Monocryl at the port sites and Steri-Strips on the fascial stitch puncture sites. OnQ catheters were placed and the sheathes peeled away.  Patient is being extubated to go to the recovery room. I'm about discussed operative findings with the patient's family.

## 2013-09-04 ENCOUNTER — Encounter (HOSPITAL_COMMUNITY): Payer: Self-pay | Admitting: Surgery

## 2013-09-04 ENCOUNTER — Telehealth (INDEPENDENT_AMBULATORY_CARE_PROVIDER_SITE_OTHER): Payer: Self-pay

## 2013-09-04 DIAGNOSIS — Z9889 Other specified postprocedural states: Principal | ICD-10-CM

## 2013-09-04 DIAGNOSIS — Z8719 Personal history of other diseases of the digestive system: Secondary | ICD-10-CM

## 2013-09-04 MED ORDER — HYDROCODONE-ACETAMINOPHEN 10-325 MG PO TABS: 1.0000 | ORAL_TABLET | Freq: Four times a day (QID) | ORAL | Status: DC | PRN

## 2013-09-04 MED ORDER — NAPROXEN 500 MG PO TABS: 500.0000 mg | ORAL_TABLET | Freq: Two times a day (BID) | ORAL | Status: DC

## 2013-09-04 NOTE — Telephone Encounter (Signed)
Pt is post op ventral hernia repair on 09/03/13.  She states that the Percocet she is taking has not been helping the pain.  She has tried one and then two at a time with minimal relief.  She is also itching.  She would like to try another pain medication.  Please advise.

## 2013-09-04 NOTE — Telephone Encounter (Signed)
Per Dr Johney [place] STOP the Oxycodone 5mg  and we are going to switch her to [place] 10/325 #40 take 1-2 tabs every 4-6 hrs prn pain. Also, going to e prescribe Naprosyn 500mg  #40 BID x 2 rf's to take with the Norco in place of the Advil. I will have a written rx for the Norco 10/325mg  at the front desk for p/u. I advised pt to take Benadyl for the itching. Pt understands.

## 2013-09-04 NOTE — Addendum Note (Signed)
Addended by: Illene Regulus on: 09/04/2013 09:14 AM   Modules accepted: Orders

## 2013-09-04 NOTE — Telephone Encounter (Signed)
Returned Kaitlin Ferrell's call. The Kaitlin Ferrell is taking the Oxycodone 5mg  2 tabs every 4hrs but not having much relief from pain. I asked if the Kaitlin Ferrell was taking Advil in between taking the pain medicine and she has not been. I advised Kaitlin Ferrell that she had a large piece of mesh put in from surgery so it does not surprise me that she is hurting day after surgery. I advised Kaitlin Ferrell that we just need to get her pain control better so she can get up and move around. The Kaitlin Ferrell states that she is itching from head to toe with no rash. The Kaitlin Ferrell said she has taken the Oxycodone before so she doesn't know why she is itching. I advised Kaitlin Ferrell to take some Benadryl but the itching is more likely coming from the Oxycodone. I advised Kaitlin Ferrell that I am going to speak to Dr Johney [place] about switching the Oxycodone and call her back.

## 2013-09-25 ENCOUNTER — Encounter (INDEPENDENT_AMBULATORY_CARE_PROVIDER_SITE_OTHER): Payer: Self-pay | Admitting: Surgery

## 2013-09-25 ENCOUNTER — Ambulatory Visit (INDEPENDENT_AMBULATORY_CARE_PROVIDER_SITE_OTHER): Payer: BC Managed Care – PPO | Admitting: Surgery

## 2013-09-25 VITALS — BP 126/70 | HR 77 | Temp 97.0°F | Ht 62.0 in | Wt 152.0 lb

## 2013-09-25 DIAGNOSIS — M6208 Separation of muscle (nontraumatic), other site: Secondary | ICD-10-CM

## 2013-09-25 DIAGNOSIS — K429 Umbilical hernia without obstruction or gangrene: Secondary | ICD-10-CM

## 2013-09-25 DIAGNOSIS — M6200 Separation of muscle (nontraumatic), unspecified site: Secondary | ICD-10-CM

## 2013-09-25 NOTE — Patient Instructions (Signed)
HERNIA REPAIR: POST OP INSTRUCTIONS  1. DIET: Follow a light bland diet the first 24 hours after arrival home, such as soup, liquids, crackers, etc.  Be sure to include lots of fluids daily.  Avoid fast food or heavy meals as your are more likely to get nauseated.  Eat a low fat the next few days after surgery. 2. Take your usually prescribed home medications unless otherwise directed. 3. PAIN CONTROL: a. Pain is best controlled by a usual combination of three different methods TOGETHER: i. Ice/Heat ii. Over the counter pain medication iii. Prescription pain medication b. Most patients will experience some swelling and bruising around the hernia(s) such as the bellybutton, groins, or old incisions.  Ice packs or heating pads (30-60 minutes up to 6 times a day) will help. Use ice for the first few days to help decrease swelling and bruising, then switch to heat to help relax tight/sore spots and speed recovery.  Some people prefer to use ice alone, heat alone, alternating between ice & heat.  Experiment to what works for you.  Swelling and bruising can take several weeks to resolve.   c. It is helpful to take an over-the-counter pain medication regularly for the first few weeks.  Choose one of the following that works best for you: i. Naproxen (Aleve, etc)  Two 253m tabs twice a day ii. Ibuprofen (Advil, etc) Three 2034mtabs four times a day (every meal & bedtime) iii. Acetaminophen (Tylenol, etc) 325-65032mour times a day (every meal & bedtime) d. A  prescription for pain medication should be given to you upon discharge.  Take your pain medication as prescribed.  i. If you are having problems/concerns with the prescription medicine (does not control pain, nausea, vomiting, rash, itching, etc), please call us [phone] see if we need to switch you to a different pain medicine that will work better for you and/or control your side effect better. ii. If you need a refill on your pain  medication, please contact your pharmacy.  They will contact our office to request authorization. Prescriptions will not be filled after 5 pm or on week-ends. 4. Avoid getting constipated.  Between the surgery and the pain medications, it is common to experience some constipation.  Increasing fluid intake and taking a fiber supplement (such as Metamucil, Citrucel, FiberCon, MiraLax, etc) 1-2 times a day regularly will usually help prevent this problem from occurring.  A mild laxative (prune juice, Milk of Magnesia, MiraLax, etc) should be taken according to package directions if there are no bowel movements after 48 hours.   5. Wash / shower every day.  You may shower over the dressings as they are waterproof.   6. Remove your waterproof bandages 5 days after surgery.  You may leave the incision open to air.  You may replace a dressing/Band-Aid to cover the incision for comfort if you wish.  Continue to shower over incision(s) after the dressing is off.    7. ACTIVITIES as tolerated:   a. You may resume regular (light) daily activities beginning the next day-such as daily self-care, walking, climbing stairs-gradually increasing activities as tolerated.  If you can walk 30 minutes without difficulty, it is safe to try more intense activity such as jogging, treadmill, bicycling, low-impact aerobics, swimming, etc. b. Save the most intensive and strenuous activity for last such as sit-ups, heavy lifting, contact sports, etc  Refrain from any heavy lifting or straining until you are off narcotics for pain control.  c. DO NOT PUSH THROUGH PAIN.  Let pain be your guide: If it hurts to do something, don't do it.  Pain is your body warning you to avoid that activity for another week until the pain goes down. d. You may drive when you are no longer taking prescription pain medication, you can comfortably wear a seatbelt, and you can safely maneuver your car and apply brakes. e. Dennis Bast may have sexual intercourse  when it is comfortable.  8. FOLLOW UP in our office a. Please call [facility] at [phone] to set up an appointment to see your surgeon in the office for a follow-up appointment approximately 2-3 weeks after your surgery. b. Make sure that you call for this appointment the day you arrive home to insure a convenient appointment time. 9.  IF YOU HAVE DISABILITY OR FAMILY LEAVE FORMS, BRING THEM TO THE OFFICE FOR PROCESSING.  DO NOT GIVE THEM TO YOUR DOCTOR.  WHEN TO CALL us [phone]: 1. Poor pain control 2. Reactions / problems with new medications (rash/itching, nausea, etc)  3. Fever over 101.5 F (38.5 C) 4. Inability to urinate 5. Nausea and/or vomiting 6. Worsening swelling or bruising 7. Continued bleeding from incision. 8. Increased pain, redness, or drainage from the incision   The clinic staff is available to answer your questions during regular business hours (8:30am-5pm).  Please don't hesitate to call and ask to speak to one of our nurses for clinical concerns.   If you have a medical emergency, go to the nearest emergency room or call 911.  A surgeon from [facility] is always on call at the hospitals in [facility], [place], [place], [place], [place]  [address] ?  [address] [address], [place], [place]   [address] MAIN: [phone] ? TOLL FREE: [phone] ? FAX: [phone] [email or web address]  GETTING TO GOOD BOWEL HEALTH. Irregular bowel habits such as constipation and diarrhea can lead to many problems over time.  Having one soft bowel movement a day is the most important way to prevent further problems.  The anorectal canal is designed to handle stretching and feces to safely manage our ability to get rid of solid waste (feces, poop, stool) out of our body.  BUT, hard constipated stools can act like ripping concrete bricks and diarrhea can be a burning fire to this very sensitive area of our body, causing  inflamed hemorrhoids, anal fissures, increasing risk is perirectal abscesses, abdominal pain/bloating, an making irritable bowel worse.     The goal: ONE SOFT BOWEL MOVEMENT A DAY!  To have soft, regular bowel movements:    Drink at least 8 tall glasses of water a day.     Take plenty of fiber.  Fiber is the undigested part of plant food that passes into the colon, acting s "natures broom" to encourage bowel motility and movement.  Fiber can absorb and hold large amounts of water. This results in a larger, bulkier stool, which is soft and easier to pass. Work gradually over several weeks up to 6 servings a day of fiber (25g a day even more if needed) in the form of: o Vegetables -- Root (potatoes, carrots, turnips), leafy green (lettuce, salad greens, celery, spinach), or cooked high residue (cabbage, broccoli, etc) o Fruit -- Fresh (unpeeled skin & pulp), Dried (prunes, apricots, cherries, etc ),  or stewed ( applesauce)  o Whole grain breads, pasta, etc (whole wheat)  o Bran cereals  Bulking Agents -- This type of water-retaining fiber generally is easily obtained each day by one of the following:  o Psyllium bran -- The psyllium plant is remarkable because its ground seeds can retain so much water. This product is available as Metamucil, Konsyl, Effersyllium, Per Diem Fiber, or the less expensive generic preparation in drug and health food stores. Although labeled a laxative, it really is not a laxative.  o Methylcellulose -- This is another fiber derived from wood which also retains water. It is available as Citrucel. o Polyethylene Glycol - and "artificial" fiber commonly called Miralax or Glycolax.  It is helpful for people with gassy or bloated feelings with regular fiber o Flax Seed - a less gassy fiber than psyllium   No reading or other relaxing activity while on the toilet. If bowel movements take longer than 5 minutes, you are too constipated   AVOID CONSTIPATION.  High fiber and water  intake usually takes care of this.  Sometimes a laxative is needed to stimulate more frequent bowel movements, but    Laxatives are not a good long-term solution as it can wear the colon out. o Osmotics (Milk of Magnesia, Fleets phosphosoda, Magnesium citrate, MiraLax, GoLytely) are safer than  o Stimulants (Senokot, Castor Oil, Dulcolax, Ex Lax)    o Do not take laxatives for more than 7days in a row.    IF SEVERELY CONSTIPATED, try a Bowel Retraining Program: o Do not use laxatives.  o Eat a diet high in roughage, such as bran cereals and leafy vegetables.  o Drink six (6) ounces of prune or apricot juice each morning.  o Eat two (2) large servings of stewed fruit each day.  o Take one (1) heaping tablespoon of a psyllium-based bulking agent twice a day. Use sugar-free sweetener when possible to avoid excessive calories.  o Eat a normal breakfast.  o Set aside 15 minutes after breakfast to sit on the toilet, but do not strain to have a bowel movement.  o If you do not have a bowel movement by the third day, use an enema and repeat the above steps.    Controlling diarrhea o Switch to liquids and simpler foods for a few days to avoid stressing your intestines further. o Avoid dairy products (especially milk & ice cream) for a short time.  The intestines often can lose the ability to digest lactose when stressed. o Avoid foods that cause gassiness or bloating.  Typical foods include beans and other legumes, cabbage, broccoli, and dairy foods.  Every person has some sensitivity to other foods, so listen to our body and avoid those foods that trigger problems for you. o Adding fiber (Citrucel, Metamucil, psyllium, Miralax) gradually can help thicken stools by absorbing excess fluid and retrain the intestines to act more normally.  Slowly increase the dose over a few weeks.  Too much fiber too soon can backfire and cause cramping & bloating. o Probiotics (such as active yogurt, Align, etc) may help  repopulate the intestines and colon with normal bacteria and calm down a sensitive digestive tract.  Most studies show it to be of mild help, though, and such products can be costly. o Medicines:   Bismuth subsalicylate (ex. Kayopectate, Pepto Bismol) every 30 minutes for up to 6 doses can help control diarrhea.  Avoid if pregnant.   Loperamide (Immodium) can slow down diarrhea.  Start with two tablets ($RemoveBefo'4mg'[id number]$  total) first and then try one tablet every 6 hours.  Avoid if you  are having fevers or severe pain.  If you are not better or start feeling worse, stop all medicines and call your doctor for advice o Call your doctor if you are getting worse or not better.  Sometimes further testing (cultures, endoscopy, X-ray studies, bloodwork, etc) may be needed to help diagnose and treat the cause of the diarrhea.  Hernia A hernia occurs when an internal organ pushes out through a weak spot in the abdominal wall. Hernias most commonly occur in the groin and around the navel. Hernias often can be pushed back into place (reduced). Most hernias tend to get worse over time. Some abdominal hernias can get stuck in the opening (irreducible or incarcerated hernia) and cannot be reduced. An irreducible abdominal hernia which is tightly squeezed into the opening is at risk for impaired blood supply (strangulated hernia). A strangulated hernia is a medical emergency. Because of the risk for an irreducible or strangulated hernia, surgery may be recommended to repair a hernia. CAUSES   Heavy lifting.  Prolonged coughing.  Straining to have a bowel movement.  A cut (incision) made during an abdominal surgery. HOME CARE INSTRUCTIONS   Bed rest is not required. You may continue your normal activities.  Avoid lifting more than 10 pounds (4.5 kg) or straining.  Cough gently. If you are a smoker it is best to stop. Even the best hernia repair can break down with the continual strain of coughing. Even if you do not have  your hernia repaired, a cough will continue to aggravate the problem.  Do not wear anything tight over your hernia. Do not try to keep it in with an outside bandage or truss. These can damage abdominal contents if they are trapped within the hernia sac.  Eat a normal diet.  Avoid constipation. Straining over long periods of time will increase hernia size and encourage breakdown of repairs. If you cannot do this with diet alone, stool softeners may be used. SEEK IMMEDIATE MEDICAL CARE IF:   You have a fever.  You develop increasing abdominal pain.  You feel nauseous or vomit.  Your hernia is stuck outside the abdomen, looks discolored, feels hard, or is tender.  You have any changes in your bowel habits or in the hernia that are unusual for you.  You have increased pain or swelling around the hernia.  You cannot push the hernia back in place by applying gentle pressure while lying down. MAKE SURE YOU:   Understand these instructions.  Will watch your condition.  Will get help right away if you are not doing well or get worse. Document Released: 01/24/2005 Document Revised: 04/18/2011 Document Reviewed: 09/13/2007 [facility] Patient Information 2015 [place], [place]. This information is not intended to replace advice given to you by your health care provider. Make sure you discuss any questions you have with your health care provider.  Exercise to Stay Healthy Exercise helps you become and stay healthy. EXERCISE IDEAS AND TIPS Choose exercises that:  You enjoy.  Fit into your day. You do not need to exercise really hard to be healthy. You can do exercises at a slow or medium level and stay healthy. You can:  Stretch before and after working out.  Try yoga, Pilates, or tai chi.  Lift weights.  Walk fast, swim, jog, run, climb stairs, bicycle, dance, or rollerskate.  Take aerobic classes. Exercises that burn about 150 calories:  Running 1  miles in 15  minutes.  Playing volleyball for 45 to 60 minutes.  Washing and waxing a car for 45 to 60 minutes.  Playing touch football for 45 minutes.  Walking 1  miles in 35 minutes.  Pushing a stroller 1  miles in 30 minutes.  Playing basketball for 30 minutes.  Raking leaves for 30 minutes.  Bicycling 5 miles in 30 minutes.  Walking 2 miles in 30 minutes.  Dancing for 30 minutes.  Shoveling snow for 15 minutes.  Swimming laps for 20 minutes.  Walking up stairs for 15 minutes.  Bicycling 4 miles in 15 minutes.  Gardening for 30 to 45 minutes.  Jumping rope for 15 minutes.  Washing windows or floors for 45 to 60 minutes. Document Released: 02/26/2010 Document Revised: 04/18/2011 Document Reviewed: 02/26/2010 [employer] Patient Information 2015 [employer]. This information is not intended to replace advice given to you by your health care provider. Make sure you discuss any questions you have with your health care provider.   

## 2013-09-25 NOTE — Progress Notes (Signed)
Subjective:     Patient ID: Kaitlin Ferrell, female   DOB: 03-03-1965, 48 y.o.   MRN: [id number]  HPI  Note: Portions of this report may have been transcribed using voice recognition software. Every effort was made to ensure accuracy; however, inadvertent computerized transcription errors may be present.   Any transcriptional errors that result from this process are unintentional.       Kaitlin Ferrell  1965/12/05 [id number]  Patient Care Team: Rafaela M. Ruben Gottron, MD as PCP - General (Family Medicine) Alvino Chapel, MD as Consulting Physician (Gynecology) Gordy Levan, MD as Consulting Physician (Medical Oncology)  Procedure (Date: 09/03/2013):  POST-OPERATIVE DIAGNOSIS: umbilical hernia with diastasis recti   PROCEDURE: Procedure(s):  LAPAROSCOPIC VENTRAL WALL HERNIA REPAIR  INSERTION OF MESH  SURGEON: Surgeon(s):  Adin Hector, MD   OR FINDINGS: 4 x 4 centimeter periumbilical hernia In the setting of moderate diastasis recti.. Not incarcerated.  Type of repair - Laparoscopic underlay repair & primary repair  Name of mesh - Bard Ventralight dual sided (polypropylene / Seprafilm)  Size of mesh - Length 20 cm, Width 15 cm  Mesh overlap - 5-7 cm  Placement of mesh - Intraperitoneal underlay repair    This patient returns for surgical re-evaluation.  She is feeling well.  Only mild twinges of discomfort with activity.  She went back to work for 2 weeks.  Mild constipation controlled MiraLAX.  Energy level good.  Eating normally.  In good spirits.  Patient Active Problem List   Diagnosis Date Noted  . Umbilical hernia 39/76/7341  . Diastasis recti 08/27/2012  . Migraine headache 12/14/2011  . 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 Miscarriage   . Fibrocystic breast changes   . Migraine   . GTD (gestational trophoblastic disease) 2013    treated with 4 cycles of actinomycin D from  12-02-11 thru 01-13-12  . H/O molar pregnancy, antepartum   . 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 Dilation and curettage of uterus  1992, 09/2011  . Wisdom tooth extraction      in 11th grade  . Breast biopsy  2001    Left  . Dilation and evacuation  2011  . Portacath placement  11/2011  . Breast lumpectomy    . Portacath placement      REMOVAL  MARCH 2014  . Ventral hernia repair N/A 09/03/2013    Procedure: LAPAROSCOPIC VENTRAL WALL HERNIA REPAIR;  Surgeon: Adin Hector, MD;  Location: [facility];  Service: General;  Laterality: N/A;  . Insertion of mesh N/A 09/03/2013    Procedure: INSERTION OF MESH;  Surgeon: Adin Hector, MD;  Location: [facility];  Service: General;  Laterality: N/A;    History   Social History  . Marital Status: Married    Spouse Name: [name]    Number of Children: 2  . Years of Education: college   Occupational History  .      CMA-  [employer]   Social History Main Topics  . Smoking status: Never Smoker   . Smokeless tobacco: Never Used  . Alcohol Use: 0.0 oz/week     Comment: occas  . Drug Use: No  . Sexual Activity: Not Currently    Birth Control/ Protection: Pill   Other Topics Concern  . Not on file   Social History Narrative   [name]. Patient works part time at [employer].   Right handed.   Patient has her CMA.   Caffeine- None             Family History  Problem Relation Age of Onset  . Breast cancer Mother   . Heart attack Mother   . Diabetes Father   . Breast cancer Maternal Aunt   . Breast cancer Maternal Grandmother   . Stroke Paternal Grandmother   . Breast cancer Maternal Aunt   . Alcoholism      Current Outpatient Prescriptions  Medication Sig Dispense Refill  . acetaminophen (TYLENOL) 325 MG tablet Take 650 mg by mouth every 6 (six) hours as needed for headache (if relpax does not help).      Marland Kitchen eletriptan (RELPAX) 40 MG tablet  Take 1 tablet (40 mg total) by mouth as needed for migraine or headache. One tablet by mouth at onset of headache. May repeat in 2 hours if headache persists or recurs.  15 tablet  12  . HYDROcodone-acetaminophen (NORCO) 10-325 MG per tablet Take 1-2 tablets by mouth every 6 (six) hours as needed for moderate pain or severe pain.  40 tablet  0  . ibuprofen (ADVIL,MOTRIN) 200 MG tablet Take 400 mg by mouth every 6 (six) hours as needed.      . magnesium oxide (MAG-OX) 400 MG tablet Take 400 mg by mouth 2 (two) times daily.       . naproxen (NAPROSYN) 500 MG tablet Take 1 tablet (500 mg total) by mouth 2 (two) times daily with a meal.  40 tablet  2  . Norethindrone-Ethinyl Estradiol-Fe Biphas (LO LOESTRIN FE) 1 MG-10 MCG / 10 MCG tablet Take 1 tablet by mouth daily. bedtime      . nortriptyline (PAMELOR) 50 MG capsule Take 50 mg by mouth at bedtime.      Marland Kitchen oxyCODONE (OXY IR/ROXICODONE) 5 MG immediate release tablet Take 1-2 tablets (5-10 mg total) by mouth every 4 (four) hours as needed for moderate pain, severe pain or breakthrough pain.  50 tablet  0  . Pyridoxine HCl (VITAMIN B-6 PO) Take 100 mg by mouth 2 (two) times daily.       Marland Kitchen topiramate (TOPAMAX) 50 MG tablet Take 50 mg by mouth 2 (two) times daily. 9AM, 9PM       No current facility-administered medications for this visit.     Allergies  Allergen Reactions  . Compazine [Prochlorperazine Edisylate]     Very agitated  . Labetalol Hives, Itching and Swelling    BP 126/70  Pulse 77  Temp(Src) 97 F (36.1 C)  Ht 5\' 2"  (1.575 m)  Wt 152 lb (68.947 kg)  BMI 27.79 kg/m2  No results found.   Review of Systems  Constitutional: Negative for fever, chills and diaphoresis.  HENT: Negative for ear pain, sore throat and trouble swallowing.   Eyes: Negative for photophobia and visual disturbance.  Respiratory: Negative for cough and choking.   Cardiovascular: Negative for chest pain and palpitations.  Gastrointestinal: Negative for  nausea, vomiting, diarrhea, constipation, blood in stool, abdominal distention, anal bleeding and rectal pain.  Genitourinary: Negative for dysuria, frequency and difficulty urinating.  Musculoskeletal: Negative for gait problem and myalgias.  Skin: Negative for color change, pallor and rash.  Neurological: Negative for dizziness, speech difficulty, weakness and numbness.  Hematological: Negative for adenopathy.  Psychiatric/Behavioral: Negative for confusion and agitation. The patient is not nervous/anxious.        Objective:  Physical Exam  Constitutional: She is oriented to person, place, and time. She appears well-developed and well-nourished. No distress.  HENT:  Head: Normocephalic.  Mouth/Throat: Oropharynx is clear and moist. No oropharyngeal exudate.  Eyes: Conjunctivae and EOM are normal. Pupils are equal, round, and reactive to light. No scleral icterus.  Neck: Normal range of motion. No tracheal deviation present.  Cardiovascular: Normal rate and intact distal pulses.   Pulmonary/Chest: Effort normal. No respiratory distress. She exhibits no tenderness.  Abdominal: Soft. She exhibits no distension. There is no tenderness. Hernia confirmed negative in the right inguinal area and confirmed negative in the left inguinal area.  Incisions clean with normal healing ridges.  No hernias  Genitourinary: No vaginal discharge found.  Musculoskeletal: Normal range of motion. She exhibits no tenderness.  Lymphadenopathy:       Right: No inguinal adenopathy present.       Left: No inguinal adenopathy present.  Neurological: She is alert and oriented to person, place, and time. No cranial nerve deficit. She exhibits normal muscle tone. Coordination normal.  Skin: Skin is warm and dry. No rash noted. She is not diaphoretic.  Psychiatric: She has a normal mood and affect. Her behavior is normal.       Assessment:     Recovering remarkably well only 3 weeks status post repair of  periumbilical ventral hernia with mesh.     Plan:     Increase activity as tolerated to regular activity.  Low impact exercise such as walking an hour a day at least ideal.  Do not push through pain.  Diet as tolerated.  Low fat high fiber diet ideal.  Bowel regimen with 30 g fiber a day and fiber supplement as needed to avoid problems.  Return to clinic as needed.   Instructions discussed.  Followup with primary care physician for other health issues as would normally be done.  Consider screening for malignancies (breast, prostate, colon, melanoma, etc) as appropriate.  Questions answered.  The patient expressed understanding and appreciation

## 2013-09-27 ENCOUNTER — Telehealth: Payer: Self-pay | Admitting: Neurology

## 2013-09-27 NOTE — Telephone Encounter (Signed)
I have talked her, her headache has much improved, she is only take nortripytline 10mg  qhs xone week then stop, she is concerned about weight gain  Keep topamax 50mg   Bid.  Keep Oct 2nd appt.

## 2013-09-27 NOTE — Telephone Encounter (Signed)
Spoke to patient and she relayed that she has been gaining weight since she started on the Nortriptyline, and had not changed her diet in any way.  The medication has helped her in sleeping and her headaches.  She started to take one pill daily of Nortriptyline instead of two.  She would like to know what to do.

## 2013-11-08 ENCOUNTER — Encounter: Payer: Self-pay | Admitting: Nurse Practitioner

## 2013-11-08 ENCOUNTER — Ambulatory Visit (INDEPENDENT_AMBULATORY_CARE_PROVIDER_SITE_OTHER): Payer: BC Managed Care – PPO | Admitting: Nurse Practitioner

## 2013-11-08 VITALS — BP 136/87 | HR 92 | Wt 148.8 lb

## 2013-11-08 DIAGNOSIS — G43809 Other migraine, not intractable, without status migrainosus: Secondary | ICD-10-CM

## 2013-11-08 MED ORDER — ELETRIPTAN HYDROBROMIDE 40 MG PO TABS: 40.0000 mg | ORAL_TABLET | ORAL | Status: DC | PRN

## 2013-11-08 MED ORDER — TOPIRAMATE 50 MG PO TABS: ORAL_TABLET | ORAL | Status: DC

## 2013-11-08 NOTE — Patient Instructions (Signed)
Increase topamax to 1 tab in the am and 2 in the pm.  Continue Relpax co pay card given F/U in 4 months

## 2013-11-08 NOTE — Progress Notes (Signed)
[facility]  Kaitlin Ferrell: Kaitlin Ferrell DOB: 11-Apr-1965   REASON FOR VISIT: Followup for migraine    HISTORY OF PRESENT ILLNESS: Kaitlin Ferrell, 48 year old female returns for followup. She was last seen in our office 07/05/2013. She was asked at that time to keep a record of her headaches which she has done. She's had a total of 10 migraines since last seen. She continues to use Relpax acutely. Some of her headaches come from marital issues. She had a umbilical hernia repair since last seen. She is currently on Topamax 100 mg tablets daily. She has failed Imitrex and Maxalt in the past. She returns for reevaluation   UPDATE: 5/29/15[name], 48 year old female returns for followup. She was initially evaluated for migraine by Dr. Krista Blue 03/11/2013.She has past medical history of gestational trophoblastic neoplasia, received chemotherapy from October 2014 to December 2014, She had a long-standing history of migraine, since high school, getting worse over the past 5 years, she has headaches every day, involving different parts, vertex, temporal region, sometimes it is associated light noise sensitivity, nauseous, she has been taking Advil on daily basis over the past 5 years  She has tried Topamax 75 mg as preventive medication without helping her much, she also tried Imitrex, which only help her moderately.  Trigger for migraine stress, hungry, sleep deprivation, she is going for a lot of marital stress right now.  She denies lateralized motor or sensory deficit, reported normal MRI of the brain which was arranged by her primary care physician,  UPDATE: 07/05/13 Kaitlin Ferrell returns for followup. She is still having migraines less frequently than before. She is currently taking Topamax 100 mg daily and nortriptyline 50 mg at night. She uses Relpax acutely. Maxalt was not beneficial. She claims that stress and marital problems are the reason for her headaches. She has not had a record she is  not aware of any other trigger. She returns for reevaluation. She has cut out her Advil for the most part.      REVIEW OF SYSTEMS: Full 14 system review of systems performed and notable only for those listed, all others are neg:  Constitutional: N/A  Cardiovascular: N/A  Ear/Nose/Throat: N/A  Skin: N/A  Eyes: N/A  Respiratory: N/A  Gastroitestinal: N/A  Hematology/Lymphatic: N/A  Endocrine: N/A Musculoskeletal:N/A  Allergy/Immunology: N/A  Neurological: Headache  Psychiatric: N/A Sleep : NA    ALLERGIES: Allergies  Allergen Reactions  . Compazine [Prochlorperazine Edisylate]     Very agitated  . Oxycodone-Acetaminophen   . Prochlorperazine   . Labetalol Hives, Itching, Swelling and Rash    HOME MEDICATIONS: Outpatient Prescriptions Prior to Visit  Medication Sig Dispense Refill  . acetaminophen (TYLENOL) 325 MG tablet Take 650 mg by mouth every 6 (six) hours as needed for headache (if relpax does not help).      Marland Kitchen eletriptan (RELPAX) 40 MG tablet Take 1 tablet (40 mg total) by mouth as needed for migraine or headache. One tablet by mouth at onset of headache. May repeat in 2 hours if headache persists or recurs.  15 tablet  12  . ibuprofen (ADVIL,MOTRIN) 200 MG tablet Take 400 mg by mouth every 6 (six) hours as needed.      . magnesium oxide (MAG-OX) 400 MG tablet Take 400 mg by mouth 2 (two) times daily.       . naproxen (NAPROSYN) 500 MG tablet Take 1 tablet (500 mg total) by mouth 2 (two) times daily with a meal.  40 tablet  2  .  Pyridoxine HCl (VITAMIN B-6 PO) Take 100 mg by mouth 2 (two) times daily.       Marland Kitchen topiramate (TOPAMAX) 50 MG tablet Take 50 mg by mouth 2 (two) times daily. 9AM, 9PM      . HYDROcodone-acetaminophen (NORCO) 10-325 MG per tablet Take 1-2 tablets by mouth every 6 (six) hours as needed for moderate pain or severe pain.  40 tablet  0  . Norethindrone-Ethinyl Estradiol-Fe Biphas (LO LOESTRIN FE) 1 MG-10 MCG / 10 MCG tablet Take 1 tablet by mouth  daily. bedtime      . nortriptyline (PAMELOR) 50 MG capsule Take 50 mg by mouth at bedtime.      Marland Kitchen oxyCODONE (OXY IR/ROXICODONE) 5 MG immediate release tablet Take 1-2 tablets (5-10 mg total) by mouth every 4 (four) hours as needed for moderate pain, severe pain or breakthrough pain.  50 tablet  0   No facility-administered medications prior to visit.    PAST MEDICAL HISTORY: Past Medical History  Diagnosis Date  . Miscarriage   . Fibrocystic breast changes   . Migraine   . GTD (gestational trophoblastic disease) 2013    treated with 4 cycles of actinomycin D from 12-02-11 thru 01-13-12  . H/O molar pregnancy, antepartum   . 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 Dilation and curettage of uterus  1992, 09/2011  . Wisdom tooth extraction      in 11th grade  . Breast biopsy  2001    Left  . Dilation and evacuation  2011  . Portacath placement  11/2011  . Breast lumpectomy    . Portacath placement      REMOVAL  MARCH 2014  . Ventral hernia repair N/A 09/03/2013    Procedure: LAPAROSCOPIC VENTRAL WALL HERNIA REPAIR;  Surgeon: Adin Hector, MD;  Location: [facility];  Service: General;  Laterality: N/A;  . Insertion of mesh N/A 09/03/2013    Procedure: INSERTION OF MESH;  Surgeon: Adin Hector, MD;  Location: [facility];  Service: General;  Laterality: N/A;    FAMILY HISTORY: Family History  Problem Relation Age of Onset  . Breast cancer Mother   . Heart attack Mother   . Diabetes Father   . Breast cancer Maternal Aunt   . Breast cancer Maternal Grandmother   . Stroke Paternal Grandmother   . Breast cancer Maternal Aunt   . Alcoholism      SOCIAL HISTORY: History   Social History  . Marital Status: Married    Spouse Name: [name]    Number of Children: 2  . Years of Education: college   Occupational History  .      CMA-  [employer]   Social History Main Topics  . Smoking status:  Never Smoker   . Smokeless tobacco: Never Used  . Alcohol Use: 0.0 oz/week     Comment: occas  . Drug Use: No  . Sexual Activity: Not Currently    Birth Control/ Protection: Pill   Other Topics Concern  . Not on file   Social History Narrative   Kaitlin Ferrell is married [name]). Kaitlin Ferrell works part time at [employer].   Right handed.   Kaitlin Ferrell has her CMA.   Caffeine- None              PHYSICAL EXAM  Filed Vitals:   11/08/13 0826  BP: 136/87  Pulse: 92  Weight: 148 lb 12.8  oz (67.495 kg)   Body mass index is 27.21 kg/(m^2). Generalized: Well developed, in no acute distress  Head: normocephalic and atraumatic,. Oropharynx benign  Neck: Supple, no carotid bruits  Cardiac: Regular rate rhythm, no murmur  Musculoskeletal: No deformity  Neurological examination  Mentation: Alert oriented to time, place, history taking. Follows all commands speech and language fluent  Cranial nerve II-XII: Pupils were equal round reactive to light extraocular movements were full, visual field were full on confrontational test. Facial sensation and strength were normal. hearing was intact to finger rubbing bilaterally. Uvula tongue midline. head turning and shoulder shrug were normal and symmetric.Tongue protrusion into cheek strength was normal.  Motor: normal bulk and tone, full strength in the BUE, BLE, No focal weakness  Coordination: finger-nose-finger, heel-to-shin bilaterally, no dysmetria  Reflexes: Brachioradialis 2/2, biceps 2/2, triceps 2/2, patellar 2/2, Achilles 2/2, plantar responses were flexor bilaterally.  Gait and Station: Rising up from seated position without assistance, normal stance, moderate stride, good arm swing, smooth turning, able to perform tiptoe, and heel walking without difficulty. Tandem gait is steady     DIAGNOSTIC DATA (LABS, IMAGING, TESTING) - I reviewed Kaitlin Ferrell records, labs, notes, testing and imaging myself where available.  Lab Results  Component Value  Date   WBC 6.6 08/23/2013   HGB 12.7 08/23/2013   HCT 39.3 08/23/2013   MCV 87.7 08/23/2013   PLT 217 08/23/2013      Component Value Date/Time   NA 140 02/21/2012 1205   K 3.4* 02/21/2012 1205   CL 111* 02/21/2012 1205   CO2 20* 02/21/2012 1205   GLUCOSE 70 02/21/2012 1205   BUN 11.0 02/21/2012 1205   CREATININE 0.9 02/21/2012 1205   CALCIUM 9.2 02/21/2012 1205   PROT 7.6 02/21/2012 1205   ALBUMIN 3.8 02/21/2012 1205   AST 13 02/21/2012 1205   ALT <6 Repeated and Verified 02/21/2012 1205   ALKPHOS 40 02/21/2012 1205   BILITOT 0.45 02/21/2012 1205       ASSESSMENT AND PLAN  48 y.o. year old female  has a past medical history of Migraine; here to followup. She has had a record of her headaches and has had 10 since 07/05/2013.  Increase topamax to 1 tab in the am and 2 in the pm.  Continue Relpax co pay card given F/U in 4 months Dennie Bible, [facility], [facility], [facility] [address], [place], [place] [address] [phone]

## 2013-11-22 ENCOUNTER — Other Ambulatory Visit: Payer: Self-pay

## 2014-03-12 ENCOUNTER — Encounter: Payer: Self-pay | Admitting: Nurse Practitioner

## 2014-03-12 ENCOUNTER — Telehealth: Payer: Self-pay

## 2014-03-12 ENCOUNTER — Ambulatory Visit (INDEPENDENT_AMBULATORY_CARE_PROVIDER_SITE_OTHER): Payer: BLUE CROSS/BLUE SHIELD | Admitting: Nurse Practitioner

## 2014-03-12 VITALS — BP 97/63 | HR 81 | Ht 62.0 in | Wt 137.0 lb

## 2014-03-12 DIAGNOSIS — G43809 Other migraine, not intractable, without status migrainosus: Secondary | ICD-10-CM

## 2014-03-12 MED ORDER — TOPIRAMATE 50 MG PO TABS: ORAL_TABLET | ORAL | Status: DC

## 2014-03-12 MED ORDER — ELETRIPTAN HYDROBROMIDE 40 MG PO TABS: 40.0000 mg | ORAL_TABLET | ORAL | Status: DC | PRN

## 2014-03-12 MED ORDER — MAGNESIUM OXIDE 400 MG PO TABS: 400.0000 mg | ORAL_TABLET | Freq: Two times a day (BID) | ORAL | Status: DC

## 2014-03-12 NOTE — Patient Instructions (Signed)
Continue Topamax 1am 2 pm will refill Continue Relpax will refill Continue Magnesium will refill F/U 6 to 8 months

## 2014-03-12 NOTE — Progress Notes (Signed)
[facility]  PATIENT: Kaitlin Ferrell DOB: 29-Apr-1965   REASON FOR VISIT: Follow-up for migraine HISTORY FROM: Patient    HISTORY OF PRESENT ILLNESS:Kaitlin Ferrell, 49 year old female returns for followup. She was last seen in our office 11/08/2013.  Her Topamax was increased at that time to 150 total dose daily with good results. Her triggers continue to be weather changes and stress.She continues to use Relpax acutely. Some of her headaches come from marital issues.  She has failed Imitrex and Maxalt in the past.  She has no new neurologic complaints. She returns for reevaluation   UPDATE: 5/29/15[name], 49 year old female returns for followup. She was initially evaluated for migraine by Dr. Krista Blue 03/11/2013.She has past medical history of gestational trophoblastic neoplasia, received chemotherapy from October 2014 to December 2014, She had a long-standing history of migraine, since high school, getting worse over the past 5 years, she has headaches every day, involving different parts, vertex, temporal region, sometimes it is associated light noise sensitivity, nauseous, she has been taking Advil on daily basis over the past 5 years  She has tried Topamax 75 mg as preventive medication without helping her much, she also tried Imitrex, which only help her moderately.  Trigger for migraine stress, hungry, sleep deprivation, she is going for a lot of marital stress right now.  She denies lateralized motor or sensory deficit, reported normal MRI of the brain which was arranged by her primary care physician,  UPDATE: 07/05/13 patient returns for followup. She is still having migraines less frequently than before. She is currently taking Topamax 100 mg daily and nortriptyline 50 mg at night. She uses Relpax acutely. Maxalt was not beneficial. She claims that stress and marital problems are the reason for her headaches. She has not had a record she is not aware of any other  trigger. She returns for reevaluation. She has cut out her Advil for the most part.    REVIEW OF SYSTEMS: Full 14 system review of systems performed and notable only for those listed, all others are neg:  Constitutional: neg  Cardiovascular: neg Ear/Nose/Throat: neg  Skin: neg Eyes: neg Respiratory: neg Gastroitestinal: neg  Hematology/Lymphatic: neg  Endocrine: neg Musculoskeletal:neg Allergy/Immunology: neg Neurological: neg Psychiatric: neg Sleep : neg   ALLERGIES: Allergies  Allergen Reactions  . Compazine [Prochlorperazine Edisylate]     Very agitated  . Oxycodone-Acetaminophen   . Prochlorperazine   . Labetalol Hives, Itching, Swelling and Rash    HOME MEDICATIONS: Outpatient Prescriptions Prior to Visit  Medication Sig Dispense Refill  . acetaminophen (TYLENOL) 325 MG tablet Take 650 mg by mouth every 6 (six) hours as needed for headache (if relpax does not help).    Marland Kitchen eletriptan (RELPAX) 40 MG tablet Take 1 tablet (40 mg total) by mouth as needed for migraine or headache. One tablet by mouth at onset of headache. May repeat in 2 hours if headache persists or recurs. 15 tablet 6  . ibuprofen (ADVIL,MOTRIN) 200 MG tablet Take 400 mg by mouth every 6 (six) hours as needed.    . IUD'S IU by Intrauterine route.    . magnesium oxide (MAG-OX) 400 MG tablet Take 400 mg by mouth 2 (two) times daily.     . naproxen (NAPROSYN) 500 MG tablet Take 1 tablet (500 mg total) by mouth 2 (two) times daily with a meal. 40 tablet 2  . Pyridoxine HCl (VITAMIN B-6 PO) Take 100 mg by mouth 2 (two) times daily.     Marland Kitchen topiramate (  TOPAMAX) 50 MG tablet 1 am,  2 in the pm 90 tablet 6   No facility-administered medications prior to visit.    PAST MEDICAL HISTORY: Past Medical History  Diagnosis Date  . Miscarriage   . Fibrocystic breast changes   . Migraine   . GTD (gestational trophoblastic disease) 2013    treated with 4 cycles of actinomycin D from 12-02-11 thru 01-13-12  . H/O  molar pregnancy, antepartum   . 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 Dilation and curettage of uterus  1992, 09/2011  . Wisdom tooth extraction      in 11th grade  . Breast biopsy  2001    Left  . Dilation and evacuation  2011  . Portacath placement  11/2011  . Breast lumpectomy    . Portacath placement      REMOVAL  MARCH 2014  . Ventral hernia repair N/A 09/03/2013    Procedure: LAPAROSCOPIC VENTRAL WALL HERNIA REPAIR;  Surgeon: Adin Hector, MD;  Location: [facility];  Service: General;  Laterality: N/A;  . Insertion of mesh N/A 09/03/2013    Procedure: INSERTION OF MESH;  Surgeon: Adin Hector, MD;  Location: [facility];  Service: General;  Laterality: N/A;    FAMILY HISTORY: Family History  Problem Relation Age of Onset  . Breast cancer Mother   . Heart attack Mother   . Diabetes Father   . Breast cancer Maternal Aunt   . Breast cancer Maternal Grandmother   . Stroke Paternal Grandmother   . Breast cancer Maternal Aunt   . Alcoholism      SOCIAL HISTORY: History   Social History  . Marital Status: Married    Spouse Name: [name]    Number of Children: 2  . Years of Education: college   Occupational History  .      CMA-  [employer]   Social History Main Topics  . Smoking status: Never Smoker   . Smokeless tobacco: Never Used  . Alcohol Use: 0.6 oz/week    1 Glasses of wine per week     Comment: occas  . Drug Use: No  . Sexual Activity: Not Currently    Birth Control/ Protection: Pill   Other Topics Concern  . Not on file   Social History Narrative   Patient is married [name]). Patient works part time at [employer].   Right handed.   Patient has her CMA.   Caffeine- None              PHYSICAL EXAM  Filed Vitals:   03/12/14 0828  BP: 97/63  Pulse: 81  Height: 5\' 2"  (1.575 m)  Weight: 137 lb (62.143 kg)   Body mass index is 25.05  kg/(m^2).  Generalized: Well developed, in no acute distress  Head: normocephalic and atraumatic,. Oropharynx benign  Neck: Supple, no carotid bruits  Cardiac: Regular rate rhythm, no murmur  Musculoskeletal: No deformity   Neurological examination   Mentation: Alert oriented to time, place, history taking. Attention span and concentration appropriate. Recent and remote memory intact.  Follows all commands speech and language fluent.   Cranial nerve II-XII: Pupils were equal round reactive to light extraocular movements were full, visual field were full on confrontational test. Facial sensation and strength were normal. hearing was intact to finger rubbing bilaterally. Uvula tongue midline. head turning and shoulder shrug were normal and symmetric.Tongue protrusion into  cheek strength was normal. Motor: normal bulk and tone, full strength in the BUE, BLE, fine finger movements normal, no pronator drift. No focal weakness Sensory: normal and symmetric to light touch, pinprick, and  Vibration, proprioception  Coordination: finger-nose-finger, heel-to-shin bilaterally, no dysmetria Reflexes: Brachioradialis 2/2, biceps 2/2, triceps 2/2, patellar 2/2, Achilles 2/2, plantar responses were flexor bilaterally. Gait and Station: Rising up from seated position without assistance, normal stance,  moderate stride, good arm swing, smooth turning, able to perform tiptoe, and heel walking without difficulty. Tandem gait is steady  DIAGNOSTIC DATA (LABS, IMAGING, TESTING) - I reviewed patient records, labs, notes, testing and imaging myself where available.  Lab Results  Component Value Date   WBC 6.6 08/23/2013   HGB 12.7 08/23/2013   HCT 39.3 08/23/2013   MCV 87.7 08/23/2013   PLT 217 08/23/2013      Component Value Date/Time   NA 140 02/21/2012 1205   K 3.4* 02/21/2012 1205   CL 111* 02/21/2012 1205   CO2 20* 02/21/2012 1205   GLUCOSE 70 02/21/2012 1205   BUN 11.0 02/21/2012 1205    CREATININE 0.9 02/21/2012 1205   CALCIUM 9.2 02/21/2012 1205   PROT 7.6 02/21/2012 1205   ALBUMIN 3.8 02/21/2012 1205   AST 13 02/21/2012 1205   ALT <6 Repeated and Verified 02/21/2012 1205   ALKPHOS 40 02/21/2012 1205   BILITOT 0.45 02/21/2012 1205   ASSESSMENT AND PLAN  49 y.o. year old female  has a past medical history of ; Migraine; here to follow-up .  Her migraines are much improved with the increase in Topamax to 150 mg daily.   Continue Topamax 1am 2 pm will refill Continue Relpax will refill Continue Magnesium will refill F/U 6 to 8 months Kaitlin Ferrell, [facility], [facility], APRN  [facility] [address], [place], [place] [address] [phone]

## 2014-03-12 NOTE — Telephone Encounter (Signed)
Called patient  RX at the front desk for her her to pick up. Left her a message on her cell phone.

## 2014-03-12 NOTE — Progress Notes (Signed)
I have reviewed and agreed above plan. 

## 2014-04-01 ENCOUNTER — Telehealth: Payer: Self-pay | Admitting: Neurology

## 2014-04-01 NOTE — Telephone Encounter (Signed)
Patient is calling and wants to know if we could mail Rx for Relpax to patient at: [address], [place], [place]  [address]

## 2014-04-02 ENCOUNTER — Other Ambulatory Visit: Payer: Self-pay | Admitting: *Deleted

## 2014-04-02 DIAGNOSIS — G43909 Migraine, unspecified, not intractable, without status migrainosus: Secondary | ICD-10-CM

## 2014-04-02 MED ORDER — ELETRIPTAN HYDROBROMIDE 40 MG PO TABS: 40.0000 mg | ORAL_TABLET | ORAL | Status: DC | PRN

## 2014-04-02 NOTE — Telephone Encounter (Signed)
Previous rx printed in error and destroyed.  Resent to correct pharmacy electronically.

## 2014-09-17 ENCOUNTER — Other Ambulatory Visit: Payer: Self-pay | Admitting: Nurse Practitioner

## 2014-09-22 ENCOUNTER — Ambulatory Visit (INDEPENDENT_AMBULATORY_CARE_PROVIDER_SITE_OTHER): Payer: BLUE CROSS/BLUE SHIELD | Admitting: Nurse Practitioner

## 2014-09-22 ENCOUNTER — Encounter: Payer: Self-pay | Admitting: Nurse Practitioner

## 2014-09-22 VITALS — BP 122/80 | HR 78 | Ht 62.0 in | Wt 133.6 lb

## 2014-09-22 DIAGNOSIS — G43809 Other migraine, not intractable, without status migrainosus: Secondary | ICD-10-CM

## 2014-09-22 DIAGNOSIS — G43909 Migraine, unspecified, not intractable, without status migrainosus: Secondary | ICD-10-CM

## 2014-09-22 MED ORDER — TOPIRAMATE 50 MG PO TABS: ORAL_TABLET | ORAL | Status: DC

## 2014-09-22 MED ORDER — ELETRIPTAN HYDROBROMIDE 40 MG PO TABS: 40.0000 mg | ORAL_TABLET | ORAL | Status: DC | PRN

## 2014-09-22 NOTE — Progress Notes (Signed)
I have reviewed and agreed above plan. 

## 2014-09-22 NOTE — Patient Instructions (Signed)
Continue Topamax at current dose will refill Continue Relpax  Will refill Limit foods that cause headaches F/U in 6 to 8 months

## 2014-09-22 NOTE — Progress Notes (Signed)
[facility]  PATIENT: Kaitlin Ferrell DOB: 02-27-1965   REASON FOR VISIT: Follow-up for migraine HISTORY FROM: Patient    HISTORY OF PRESENT ILLNESS:Kaitlin Ferrell, 49 year old female returns for followup. She was last seen in our office 03/12/14. She has a long history of migraine.  Her Topamax is currently  at  150 total dose daily with good results. Her triggers continue to be weather changes and stress and some foods. She continues to use Relpax acutely. Some of her headaches come from marital issues. She has failed Imitrex and Maxalt in the past. She has no new neurologic complaints.  She feels her headaches are doing better at present. She returns for reevaluation   UPDATE: 5/29/15Ms. Kaitlin Ferrell, 49 year old female returns for followup. She was initially evaluated for migraine by Dr. Krista Blue 03/11/2013.She has past medical history of gestational trophoblastic neoplasia, received chemotherapy from October 2014 to December 2014, She had a long-standing history of migraine, since high school, getting worse over the past 5 years, she has headaches every day, involving different parts, vertex, temporal region, sometimes it is associated light noise sensitivity, nauseous, she has been taking Advil on daily basis over the past 5 years  She has tried Topamax 75 mg as preventive medication without helping her much, she also tried Imitrex, which only help her moderately.  Trigger for migraine stress, hungry, sleep deprivation, she is going for a lot of marital stress right now.  She denies lateralized motor or sensory deficit, reported normal MRI of the brain which was arranged by her primary care physician,  UPDATE: 07/05/13 patient returns for followup. She is still having migraines less frequently than before. She is currently taking Topamax 100 mg daily and nortriptyline 50 mg at night. She uses Relpax acutely. Maxalt was not beneficial. She claims that stress and marital problems are  the reason for her headaches. She has not had a record she is not aware of any other trigger. She returns for reevaluation. She has cut out her Advil for the most part.    REVIEW OF SYSTEMS: Full 14 system review of systems performed and notable only for those listed, all others are neg:  Constitutional: neg  Cardiovascular: neg Ear/Nose/Throat: neg  Skin: neg Eyes: neg Respiratory: neg Gastroitestinal: neg  Hematology/Lymphatic: neg  Endocrine: neg Musculoskeletal:neg Allergy/Immunology: neg Neurological History of migraine Psychiatric: neg Sleep : neg   ALLERGIES: Allergies  Allergen Reactions  . Compazine [Prochlorperazine Edisylate]     Very agitated  . Oxycodone-Acetaminophen   . Prochlorperazine   . Labetalol Hives, Itching, Swelling and Rash    HOME MEDICATIONS: Outpatient Prescriptions Prior to Visit  Medication Sig Dispense Refill  . acetaminophen (TYLENOL) 325 MG tablet Take 650 mg by mouth every 6 (six) hours as needed for headache (if relpax does not help).    Marland Kitchen eletriptan (RELPAX) 40 MG tablet Take 1 tablet (40 mg total) by mouth as needed for migraine or headache. May repeat in 2 hours if needed. 15 tablet 6  . ibuprofen (ADVIL,MOTRIN) 200 MG tablet Take 400 mg by mouth every 6 (six) hours as needed.    . IUD'S IU by Intrauterine route.    . magnesium oxide (MAG-OX) 400 MG tablet Take 1 tablet (400 mg total) by mouth 2 (two) times daily. 60 tablet 8  . Riboflavin (B2) 100 MG TABS Take 1 tablet by mouth 2 (two) times daily.  12  . topiramate (TOPAMAX) 50 MG tablet 1 am,  2 in the pm 90 tablet 8  .  naproxen (NAPROSYN) 500 MG tablet Take 1 tablet (500 mg total) by mouth 2 (two) times daily with a meal. 40 tablet 2  . Pyridoxine HCl (VITAMIN B-6 PO) Take 100 mg by mouth 2 (two) times daily.     Marland Kitchen topiramate (TOPAMAX) 50 MG tablet TAKE 1 TABLET BY MOUTH IN THE MORNING AND 2 IN THE EVENING 90 tablet 0   No facility-administered medications prior to visit.     PAST MEDICAL HISTORY: Past Medical History  Diagnosis Date  . Miscarriage   . Fibrocystic breast changes   . Migraine   . GTD (gestational trophoblastic disease) 2013    treated with 4 cycles of actinomycin D from 12-02-11 thru 01-13-12  . H/O molar pregnancy, antepartum   . 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 Dilation and curettage of uterus  1992, 09/2011  . Wisdom tooth extraction      in 11th grade  . Breast biopsy  2001    Left  . Dilation and evacuation  2011  . Portacath placement  11/2011  . Breast lumpectomy    . Portacath placement      REMOVAL  MARCH 2014  . Ventral hernia repair N/A 09/03/2013    Procedure: LAPAROSCOPIC VENTRAL WALL HERNIA REPAIR;  Surgeon: Adin Hector, MD;  Location: [facility];  Service: General;  Laterality: N/A;  . Insertion of mesh N/A 09/03/2013    Procedure: INSERTION OF MESH;  Surgeon: Adin Hector, MD;  Location: [facility];  Service: General;  Laterality: N/A;    FAMILY HISTORY: Family History  Problem Relation Age of Onset  . Breast cancer Mother   . Heart attack Mother   . Diabetes Father   . Breast cancer Maternal Aunt   . Breast cancer Maternal Grandmother   . Stroke Paternal Grandmother   . Breast cancer Maternal Aunt   . Alcoholism      SOCIAL HISTORY: Social History   Social History  . Marital Status: Married    Spouse Name: [name]  . Number of Children: 2  . Years of Education: college   Occupational History  .      CMA-  [employer]   Social History Main Topics  . Smoking status: Never Smoker   . Smokeless tobacco: Never Used  . Alcohol Use: 0.6 oz/week    1 Glasses of wine per week     Comment: occas  . Drug Use: No  . Sexual Activity: Not Currently    Birth Control/ Protection: Pill   Other Topics Concern  . Not on file   Social History Narrative   Patient is married [name]). Patient works part time at [employer].   Right handed.   Patient has her CMA.   Caffeine- None              PHYSICAL EXAM  Filed Vitals:   09/22/14 0813  BP: 122/80  Pulse: 78  Height: 5\' 2"  (1.575 m)  Weight: 133 lb 9.6 oz (60.601 kg)   Body mass index is 24.43 kg/(m^2). Generalized: Well developed, in no acute distress  Head: normocephalic and atraumatic,. Oropharynx benign  Neck: Supple, no carotid bruits  Musculoskeletal: No deformity   Neurological examination   Mentation: Alert oriented to time, place, history taking. Attention span and concentration appropriate. Recent and remote memory intact. Follows all commands speech and language fluent.   Cranial nerve II-XII: Pupils  were equal round reactive to light extraocular movements were full, visual field were full on confrontational test. Facial sensation and strength were normal. hearing was intact to finger rubbing bilaterally. Uvula tongue midline. head turning and shoulder shrug were normal and symmetric.Tongue protrusion into cheek strength was normal. Motor: normal bulk and tone, full strength in the BUE, BLE, fine finger movements normal, no pronator drift. No focal weakness Sensory: normal and symmetric to light touch, pinprick, and Vibration, proprioception  Coordination: finger-nose-finger, heel-to-shin bilaterally, no dysmetria Reflexes: Brachioradialis 2/2, biceps 2/2, triceps 2/2, patellar 2/2, Achilles 2/2, plantar responses were flexor bilaterally. Gait and Station: Rising up from seated position without assistance, normal stance, moderate stride, good arm swing, smooth turning, able to perform tiptoe, and heel walking without difficulty. Tandem gait is steady  DIAGNOSTIC DATA (LABS, IMAGING, TESTING) -  ASSESSMENT AND PLAN  49 y.o. year old female  has a past medical history of Migraine; here to follow-up. Her headaches are in good control  with Topamax and Relpax  Continue Topamax at current dose will refill Continue Relpax   Will refill Limit foods that cause headaches, onions, grapes and bananas for her F/U in 6 to 8 months  Dennie Bible, [facility], [facility], APRN  [facility] [address] [place], [place] [address] [phone]

## 2014-11-16 ENCOUNTER — Other Ambulatory Visit: Payer: Self-pay | Admitting: Nurse Practitioner

## 2015-05-15 ENCOUNTER — Ambulatory Visit: Payer: BLUE CROSS/BLUE SHIELD | Admitting: Nurse Practitioner

## 2015-05-20 ENCOUNTER — Ambulatory Visit: Payer: BLUE CROSS/BLUE SHIELD | Admitting: Nurse Practitioner

## 2015-05-29 ENCOUNTER — Ambulatory Visit: Payer: BLUE CROSS/BLUE SHIELD | Admitting: Nurse Practitioner

## 2015-10-22 ENCOUNTER — Other Ambulatory Visit: Payer: Self-pay | Admitting: Nurse Practitioner

## 2015-10-22 DIAGNOSIS — G43909 Migraine, unspecified, not intractable, without status migrainosus: Secondary | ICD-10-CM

## 2016-01-23 ENCOUNTER — Other Ambulatory Visit: Payer: Self-pay | Admitting: Nurse Practitioner

## 2016-04-22 DIAGNOSIS — F410 Panic disorder [episodic paroxysmal anxiety] without agoraphobia: Secondary | ICD-10-CM | POA: Insufficient documentation

## 2016-04-22 DIAGNOSIS — G4700 Insomnia, unspecified: Secondary | ICD-10-CM | POA: Insufficient documentation

## 2016-04-22 DIAGNOSIS — R232 Flushing: Secondary | ICD-10-CM | POA: Insufficient documentation

## 2017-10-13 DIAGNOSIS — Z86010 Personal history of colonic polyps: Secondary | ICD-10-CM | POA: Insufficient documentation

## 2018-06-14 DIAGNOSIS — N8111 Cystocele, midline: Secondary | ICD-10-CM | POA: Insufficient documentation

## 2018-06-14 DIAGNOSIS — N812 Incomplete uterovaginal prolapse: Secondary | ICD-10-CM | POA: Insufficient documentation

## 2018-06-14 DIAGNOSIS — N816 Rectocele: Secondary | ICD-10-CM | POA: Insufficient documentation

## 2018-08-07 DIAGNOSIS — Z9889 Other specified postprocedural states: Secondary | ICD-10-CM | POA: Insufficient documentation

## 2018-08-07 DIAGNOSIS — Z90710 Acquired absence of both cervix and uterus: Secondary | ICD-10-CM | POA: Insufficient documentation

## 2018-10-17 DIAGNOSIS — K219 Gastro-esophageal reflux disease without esophagitis: Secondary | ICD-10-CM | POA: Insufficient documentation

## 2018-10-17 DIAGNOSIS — I10 Essential (primary) hypertension: Secondary | ICD-10-CM | POA: Insufficient documentation

## 2020-07-05 ENCOUNTER — Other Ambulatory Visit: Payer: Self-pay

## 2020-07-05 ENCOUNTER — Emergency Department (HOSPITAL_COMMUNITY): Payer: BC Managed Care – PPO

## 2020-07-05 ENCOUNTER — Emergency Department (HOSPITAL_COMMUNITY)
Admission: EM | Admit: 2020-07-05 | Discharge: 2020-07-06 | Disposition: A | Discharge status: Home / Self Care | Payer: BC Managed Care – PPO | Attending: Emergency Medicine | Admitting: Emergency Medicine

## 2020-07-05 ENCOUNTER — Encounter (HOSPITAL_COMMUNITY): Payer: Self-pay | Admitting: *Deleted

## 2020-07-05 DIAGNOSIS — S65511A Laceration of blood vessel of left index finger, initial encounter: Secondary | ICD-10-CM

## 2020-07-05 DIAGNOSIS — W260XXA Contact with knife, initial encounter: Secondary | ICD-10-CM | POA: Diagnosis not present

## 2020-07-05 DIAGNOSIS — Z9012 Acquired absence of left breast and nipple: Secondary | ICD-10-CM | POA: Diagnosis not present

## 2020-07-05 DIAGNOSIS — Z853 Personal history of malignant neoplasm of breast: Secondary | ICD-10-CM | POA: Diagnosis not present

## 2020-07-05 DIAGNOSIS — S6992XA Unspecified injury of left wrist, hand and finger(s), initial encounter: Secondary | ICD-10-CM | POA: Diagnosis present

## 2020-07-05 NOTE — ED Provider Notes (Signed)
Emergency Medicine Provider Triage Evaluation Note  Kaitlin Ferrell , a 55 y.o. female  was evaluated in triage.  Pt complains of laceration to the lateral aspect of the left index finger.  Patient was cutting cabbage and excellently cut herself with a knife.  Mildly bleeding in triage.  Denies any numbness or tingling.  Patient states she is up-to-date on her tetanus shot.  Review of Systems  Positive: As above Negative: As above  Physical Exam  BP 136/74 (BP Location: Right Arm)   Pulse 83   Resp 18   SpO2 95%  Gen:   Awake, no distress   Resp:  Normal effort  MSK:   Moves extremities without difficulty  Other:  Adderall aspect of left index finger with 2 cm by 1 mm deep laceration.  Mildly bleeding.   Medical Decision Making  Medically screening exam initiated at 10:29 PM.  Appropriate orders placed.  Kaitlin Ferrell was informed that the remainder of the evaluation will be completed by another provider, this initial triage assessment does not replace that evaluation, and the importance of remaining in the ED until their evaluation is complete.     Garald Balding, PA-C 07/05/20 2230    Blanchie Dessert, MD 07/06/20 [phone]

## 2020-07-05 NOTE — ED Triage Notes (Signed)
The pt cut her lt index finger cutting cabbage earlier tonight  Minimal bleeding at present

## 2020-07-06 MED ORDER — LIDOCAINE-EPINEPHRINE (PF) 2 %-1:200000 IJ SOLN
10.0000 mL | Freq: Once | INTRAMUSCULAR | Status: AC
  Administered 2020-07-06: 10 mL
  Filled 2020-07-06: qty 20

## 2020-07-06 MED ORDER — LIDOCAINE-EPINEPHRINE-TETRACAINE (LET) TOPICAL GEL
3.0000 mL | Freq: Once | TOPICAL | Status: AC
  Administered 2020-07-06: 3 mL via TOPICAL
  Filled 2020-07-06: qty 3

## 2020-07-06 NOTE — ED Provider Notes (Signed)
[facility] EMERGENCY DEPARTMENT Provider Note   CSN: [id number] Arrival date & time: 07/05/20  2052     History Chief Complaint  Patient presents with  . Finger Injury    Kaitlin Ferrell is a 55 y.o. female presents to the Emergency Department complaining of acute, persistent bleeding from laceration to the left index finger onset around 9 PM.  Patient reports she was cutting cabbage when the knife slipped and she cut the base of her left pointer finger.  Laceration is between the PIP and MCP.  She reports she is been unable to get it to stop bleeding at home prompting her visit here to the emergency department.  No specific aggravating or alleviating factors.  Last tetanus shot was 2 years ago.  No history of immunocompromise, diabetes.  The history is provided by the patient and medical records. No language interpreter was used.       Past Medical History:  Diagnosis Date  . 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 Fibrocystic breast changes   . 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 H/O molar pregnancy, antepartum   . Migraine   . Miscarriage     Patient Active Problem List   Diagnosis Date Noted  . Ventral hernia s/p lap repair w mesh July 2015 08/27/2012  . Diastasis recti 08/27/2012  . Migraine headache 12/14/2011  . Gestational trophoblastic disease s/p chemotherapy 2013 11/23/2011    Past Surgical History:  Procedure Laterality Date  . BREAST BIOPSY  2001   Left  . BREAST LUMPECTOMY    . [place], 09/2011  . DILATION AND EVACUATION  2011  . INSERTION OF MESH N/A 09/03/2013   Procedure: INSERTION OF MESH;  Surgeon: Adin Hector, MD;  Location: [facility] ORS;  Service: General;  Laterality: N/A;  . PORTACATH PLACEMENT  11/2011  . PORTACATH PLACEMENT     REMOVAL  MARCH 2014  . VENTRAL HERNIA REPAIR N/A 09/03/2013    Procedure: LAPAROSCOPIC VENTRAL WALL HERNIA REPAIR;  Surgeon: Adin Hector, MD;  Location: [facility] ORS;  Service: General;  Laterality: N/A;  . WISDOM TOOTH EXTRACTION     in 11th grade     OB History   No obstetric history on file.     Family History  Problem Relation Age of Onset  . Breast cancer Mother   . Heart attack Mother   . Diabetes Father   . Breast cancer Maternal Aunt   . Breast cancer Maternal Grandmother   . Stroke Paternal Grandmother   . Breast cancer Maternal Aunt   . Alcoholism Other     Social History   Tobacco Use  . Smoking status: Never Smoker  . Smokeless tobacco: Never Used  Substance Use Topics  . Alcohol use: Yes    Alcohol/week: 1.0 standard drink    Types: 1 Glasses of wine per week    Comment: occas  . Drug use: No    Home Medications Prior to Admission medications   Medication Sig Start Date End Date Taking? Authorizing Provider  acetaminophen (TYLENOL) 325 MG tablet Take 650 mg by mouth every 6 (six) hours as needed for headache (if relpax does not help).    [provider]  eletriptan (RELPAX) 40 MG tablet Take 1 tablet (40 mg total) by mouth as needed for migraine or headache. May repeat in 2 hours if  needed. 09/22/14   Dennie Bible, NP  ibuprofen (ADVIL,MOTRIN) 200 MG tablet Take 400 mg by mouth every 6 (six) hours as needed.    [provider]  IUD'S IU by Intrauterine route.    [provider]  magnesium oxide (MAG-OX) 400 (241.3 MG) MG tablet TAKE 1 TABLET BY MOUTH TWICE A DAY 11/16/14   Dennie Bible, NP  Riboflavin (B2) 100 MG TABS Take 1 tablet by mouth 2 (two) times daily. 02/05/14   [provider]  topiramate (TOPAMAX) 50 MG tablet 1 am,  2 in the pm 09/22/14   Dennie Bible, NP    Allergies    Compazine [prochlorperazine edisylate], Oxycodone-acetaminophen, Prochlorperazine, and Labetalol  Review of Systems   Review of Systems  Constitutional: Negative for appetite  change, diaphoresis, fatigue, fever and unexpected weight change.  HENT: Negative for mouth sores.   Eyes: Negative for visual disturbance.  Respiratory: Negative for cough, chest tightness, shortness of breath and wheezing.   Cardiovascular: Negative for chest pain.  Gastrointestinal: Negative for abdominal pain, constipation, diarrhea, nausea and vomiting.  Endocrine: Negative for polydipsia, polyphagia and polyuria.  Genitourinary: Negative for dysuria, frequency, hematuria and urgency.  Musculoskeletal: Negative for back pain and neck stiffness.  Skin: Positive for wound. Negative for rash.  Allergic/Immunologic: Negative for immunocompromised state.  Neurological: Negative for syncope, light-headedness and headaches.  Hematological: Does not bruise/bleed easily.  Psychiatric/Behavioral: Negative for sleep disturbance. The patient is not nervous/anxious.     Physical Exam Updated Vital Signs BP 136/74 (BP Location: Right Arm)   Pulse 83   Resp 18   Ht 5\' 2"  (1.575 m)   Wt 60.6 kg   SpO2 95%   BMI 24.44 kg/m   Physical Exam Vitals and nursing note reviewed.  Constitutional:      General: She is not in acute distress.    Appearance: She is well-developed.  HENT:     Head: Normocephalic.  Eyes:     General: No scleral icterus.    Conjunctiva/sclera: Conjunctivae normal.  Cardiovascular:     Rate and Rhythm: Normal rate.  Pulmonary:     Effort: Pulmonary effort is normal.  Musculoskeletal:        General: Normal range of motion.     Cervical back: Normal range of motion.     Comments: Full range of motion of the left pointer finger including isolated DIP, PIP and MCP joints  Skin:    General: Skin is warm and dry.     Capillary Refill: Capillary refill takes less than 2 seconds.     Comments: 3 cm laceration to the lateral portion of the left pointer finger; small pulsatile bleeding noted  Neurological:     General: No focal deficit present.     Mental Status: She  is alert.     Comments: Sensation intact to the distal portion of the left pointer finger.  Strength 5/5 with flexion and extension of the finger and with isolated DIP, PIP and MCP joints.  Psychiatric:        Mood and Affect: Mood normal.     ED Results / Procedures / Treatments    Radiology DG Finger Index Left  Result Date: 07/05/2020 CLINICAL DATA:  Laceration to the second digit while chopping food, initial encounter EXAM: LEFT INDEX FINGER 2+V COMPARISON:  None. FINDINGS: Bony structures appear within normal limits. No radiopaque foreign body is noted. No significant soft tissue changes are seen. IMPRESSION: No acute abnormality noted.  Electronically Signed   By: Inez Catalina M.D.   On: 07/05/2020 22:58    Procedures .Marland KitchenLaceration Repair  Date/Time: 07/06/2020 3:35 AM Performed by: Abigail Butts, PA-C Authorized by: Abigail Butts, PA-C   Consent:    Consent obtained:  Verbal   Consent given by:  Patient   Risks discussed:  Infection, need for additional repair, pain, poor cosmetic result and poor wound healing   Alternatives discussed:  No treatment and delayed treatment Universal protocol:    Procedure explained and questions answered to patient or proxy's satisfaction: yes     Relevant documents present and verified: yes     Test results available: yes     Imaging studies available: yes     Required blood products, implants, devices, and special equipment available: yes     Site/side marked: yes     Immediately prior to procedure, a time out was called: yes     Patient identity confirmed:  Verbally with patient Anesthesia:    Anesthesia method:  Local infiltration and topical application   Topical anesthetic:  LET   Local anesthetic:  Lidocaine 1% w/o epi Laceration details:    Location:  Finger   Finger location:  L index finger   Length (cm):  3 Pre-procedure details:    Preparation:  Patient was prepped and draped in usual sterile fashion and  imaging obtained to evaluate for foreign bodies Exploration:    Hemostasis achieved with:  LET, direct pressure and tourniquet   Wound exploration: wound explored through full range of motion and entire depth of wound visualized     Wound extent: vascular damage   Treatment:    Area cleansed with:  Saline   Amount of cleaning:  Standard   Irrigation solution:  Sterile water   Irrigation volume:  555mL   Irrigation method:  Syringe   Layers/structures repaired:  Muscle fascia Muscle fascia:    Suture size:  5-0   Suture material:  Plain gut   Suture technique:  Simple interrupted   Number of sutures:  1 Skin repair:    Repair method:  Sutures   Suture material:  Nylon   Suture technique:  Simple interrupted   Number of sutures:  3 Approximation:    Approximation:  Close Repair type:    Repair type:  Intermediate Post-procedure details:    Dressing:  Non-adherent dressing   Procedure completion:  Tolerated well, no immediate complications     Medications Ordered in ED Medications  lidocaine-EPINEPHrine-tetracaine (LET) topical gel (3 mLs Topical Given 07/06/20 0128)  lidocaine-EPINEPHrine (XYLOCAINE W/EPI) 2 %-1:200000 (PF) injection 10 mL (10 mLs Infiltration Given 07/06/20 0128)    ED Course  I have reviewed the triage vital signs and the nursing notes.  Pertinent labs & imaging results that were available during my care of the patient were reviewed by me and considered in my medical decision making (see chart for details).    MDM Rules/Calculators/A&P                           Presents with 3 cm laceration to the left index finger.  Exam consistent with laceration of the radial artery of the index finger.  Good capillary refill to the pad of the finger.  Will discuss with hand.  2:35 AM Discussed with Dr. Ninfa Linden, on-call for hand surgery.  He suggests that since finger is currently perfused he recommends ligating the vessel and closing the wound.  She will follow  with their hand surgeon on Tuesday for close follow-up and reevaluation of the vessel and the nerve.  3:37 AM Bleeding after let and pressure dressing seems to have subsided.  Fascia repaired and skin closed.  Will monitor for continued perfusion and persistent bleeding.  4:09 AM Reassessment of finger: Tip of finger remains well perfused.  Sensation remains intact.  No persistent bleeding from the wound.  Will discharge with strict return precautions immediately for change in sensation, change in color change in temperature or change in range of motion.  Patient states understanding and is in agreement with the plan.  Will follow up with orthopedics this week.   Final Clinical Impression(s) / ED Diagnoses Final diagnoses:  Laceration of blood vessel of left index finger, initial encounter    Rx / DC Orders ED Discharge Orders    None       Catherine Cubero, Gwenlyn Perking 07/06/20 0410    Palumbo, April, MD 07/06/20 [id number]

## 2020-07-06 NOTE — ED Notes (Signed)
Ambulatory to room 2 with steady gait.

## 2020-07-06 NOTE — ED Notes (Signed)
Patient verbalized understanding of discharge instructions. Opportunity for questions and answers.  

## 2020-07-06 NOTE — Discharge Instructions (Addendum)

## 2020-07-06 NOTE — ED Notes (Signed)
Wound irrigated at this time. LET placed.

## 2020-07-07 ENCOUNTER — Telehealth: Payer: Self-pay

## 2020-07-07 NOTE — Telephone Encounter (Signed)
Patient called concerning an appointment for her left index finger. Patient was seen at [facility] ED on 07/05/2020.  CB# [phone].  Please advise.  Thank you.

## 2020-07-07 NOTE — Telephone Encounter (Signed)
Scheduled tomorrow morning

## 2020-07-08 ENCOUNTER — Ambulatory Visit (INDEPENDENT_AMBULATORY_CARE_PROVIDER_SITE_OTHER): Payer: BC Managed Care – PPO | Admitting: Orthopaedic Surgery

## 2020-07-08 ENCOUNTER — Encounter: Payer: Self-pay | Admitting: Orthopaedic Surgery

## 2020-07-08 DIAGNOSIS — S61211A Laceration without foreign body of left index finger without damage to nail, initial encounter: Secondary | ICD-10-CM | POA: Diagnosis not present

## 2020-07-08 NOTE — Progress Notes (Signed)
Office Visit Note   Patient: Kaitlin Ferrell           Date of Birth: [birth year]           MRN: [id number] Visit Date: 07/08/2020              Requested by: Robyne Peers, MD [address] [place],  [place] [address] PCP: Robyne Peers, MD   Assessment & Plan: Visit Diagnoses:  1. Laceration of left index finger without foreign body without damage to nail, initial encounter      Plan: Fortunately this laceration to the index finger did not cause any significant damage.  She is neurovascularly intact and treatment for this is just keeping the sutures in place and then removing them in about a week from now.  She can get it wet in the shower daily.  All questions and concerns were answered and addressed.  Follow-Up Instructions: Return in about 1 week (around 07/15/2020).   Orders:  No orders of the defined types were placed in this encounter.  No orders of the defined types were placed in this encounter.     Procedures: No procedures performed   Clinical Data: No additional findings.   Subjective: Chief Complaint  Patient presents with  . Left Index Finger - Injury  The patient comes in today for evaluation treatment of the left index finger laceration.  This is to the radial side of the index finger near the proximal phalanx.  This happened Sunday evening when she was cutting some food.  She denies any numbness and tingling.  She was seen in the emergency room and they felt that there may be an arterial injury but the fingers was well-perfused and so they cleaned the wound and placed 3 small sutures and it and gave her follow-up with [place] since we are on hand call.  The patient understands that we are not hand specialist and will get her to an appropriate hand specialist if indicated.  She is otherwise a healthy 55 year old female.  HPI  Review of Systems She currently denies any headache, chest pain, shortness of breath, fever, chills, nausea,  vomiting  Objective: Vital Signs: There were no vitals taken for this visit.  Physical Exam She is alert and orient x3 and in no acute distress Ortho Exam Examination of her left index finger shows full motion of the MCP and IP joints.  There is no ligamentous instability.  There is a laceration on the radial area near the proximal phalanx but it is just slightly dorsal.  The fingertip is well-perfused.  She has normal sensation on the radial aspect of her finger and normal two-point discrimination. Specialty Comments:  No specialty comments available.  Imaging: No results found.   PMFS History: Patient Active Problem List   Diagnosis Date Noted  . Ventral hernia s/p lap repair w mesh July 2015 08/27/2012  . Diastasis recti 08/27/2012  . Migraine headache 12/14/2011  . Gestational trophoblastic disease s/p chemotherapy 2013 11/23/2011   Past Medical History:  Diagnosis Date  . 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 Fibrocystic breast changes   . 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 H/O molar pregnancy, antepartum   . Migraine   . Miscarriage     Family History  Problem Relation Age of Onset  . Breast cancer Mother   . Heart attack Mother   .  Diabetes Father   . Breast cancer Maternal Aunt   . Breast cancer Maternal Grandmother   . Stroke Paternal Grandmother   . Breast cancer Maternal Aunt   . Alcoholism Other     Past Surgical History:  Procedure Laterality Date  . BREAST BIOPSY  2001   Left  . BREAST LUMPECTOMY    . [place], 09/2011  . DILATION AND EVACUATION  2011  . INSERTION OF MESH N/A 09/03/2013   Procedure: INSERTION OF MESH;  Surgeon: Adin Hector, MD;  Location: [facility];  Service: General;  Laterality: N/A;  . PORTACATH PLACEMENT  11/2011  . PORTACATH PLACEMENT     REMOVAL  MARCH 2014  . VENTRAL HERNIA REPAIR N/A  09/03/2013   Procedure: LAPAROSCOPIC VENTRAL WALL HERNIA REPAIR;  Surgeon: Adin Hector, MD;  Location: [facility];  Service: General;  Laterality: N/A;  . WISDOM TOOTH EXTRACTION     in 11th grade   Social History   Occupational History    Comment: CMA-  [employer]  Tobacco Use  . Smoking status: Never Smoker  . Smokeless tobacco: Never Used  Substance and Sexual Activity  . Alcohol use: Yes    Alcohol/week: 1.0 standard drink    Types: 1 Glasses of wine per week    Comment: occas  . Drug use: No  . Sexual activity: Not Currently    Birth control/protection: Pill

## 2020-07-15 ENCOUNTER — Encounter: Payer: Self-pay | Admitting: Orthopaedic Surgery

## 2020-07-15 ENCOUNTER — Ambulatory Visit (INDEPENDENT_AMBULATORY_CARE_PROVIDER_SITE_OTHER): Payer: BC Managed Care – PPO | Admitting: Orthopaedic Surgery

## 2020-07-15 DIAGNOSIS — S61211A Laceration without foreign body of left index finger without damage to nail, initial encounter: Secondary | ICD-10-CM

## 2020-07-15 NOTE — Progress Notes (Signed)
The patient comes today for suture removal from her left index finger.  She sustained a laceration to this index finger a week and a half ago and it was repaired by the ER staff.  She still denies any numbness and tingling.  On exam the left index finger is well-perfused.  It has normal sensation as well.  Range of motion of all the digits is normal.  The incision is healing nicely so I remove the sutures and placed a single Steri-Strip.  All questions and concerns were answered and addressed.  She can resume her swimming activities next week.  Follow-up is as needed.

## 2021-06-18 DIAGNOSIS — Z114 Encounter for screening for human immunodeficiency virus [HIV]: Secondary | ICD-10-CM | POA: Diagnosis not present

## 2021-06-18 DIAGNOSIS — Z113 Encounter for screening for infections with a predominantly sexual mode of transmission: Secondary | ICD-10-CM | POA: Diagnosis not present

## 2021-06-18 DIAGNOSIS — Z6828 Body mass index (BMI) 28.0-28.9, adult: Secondary | ICD-10-CM | POA: Diagnosis not present

## 2021-06-18 DIAGNOSIS — Z118 Encounter for screening for other infectious and parasitic diseases: Secondary | ICD-10-CM | POA: Diagnosis not present

## 2021-06-18 DIAGNOSIS — Z1159 Encounter for screening for other viral diseases: Secondary | ICD-10-CM | POA: Diagnosis not present

## 2021-06-18 DIAGNOSIS — Z01419 Encounter for gynecological examination (general) (routine) without abnormal findings: Secondary | ICD-10-CM | POA: Diagnosis not present

## 2021-06-18 DIAGNOSIS — Z124 Encounter for screening for malignant neoplasm of cervix: Secondary | ICD-10-CM | POA: Diagnosis not present

## 2021-09-05 DIAGNOSIS — Z23 Encounter for immunization: Secondary | ICD-10-CM | POA: Diagnosis not present

## 2021-12-08 DIAGNOSIS — G43009 Migraine without aura, not intractable, without status migrainosus: Secondary | ICD-10-CM | POA: Diagnosis not present

## 2021-12-08 DIAGNOSIS — D72819 Decreased white blood cell count, unspecified: Secondary | ICD-10-CM | POA: Diagnosis not present

## 2021-12-08 DIAGNOSIS — I10 Essential (primary) hypertension: Secondary | ICD-10-CM | POA: Diagnosis not present

## 2021-12-08 DIAGNOSIS — G4709 Other insomnia: Secondary | ICD-10-CM | POA: Diagnosis not present

## 2021-12-08 DIAGNOSIS — E7800 Pure hypercholesterolemia, unspecified: Secondary | ICD-10-CM | POA: Diagnosis not present

## 2021-12-20 DIAGNOSIS — Z111 Encounter for screening for respiratory tuberculosis: Secondary | ICD-10-CM | POA: Diagnosis not present

## 2021-12-20 DIAGNOSIS — Z23 Encounter for immunization: Secondary | ICD-10-CM | POA: Diagnosis not present

## 2021-12-22 DIAGNOSIS — Z0279 Encounter for issue of other medical certificate: Secondary | ICD-10-CM | POA: Diagnosis not present

## 2022-03-02 ENCOUNTER — Other Ambulatory Visit: Payer: Self-pay | Admitting: Gastroenterology

## 2022-03-02 DIAGNOSIS — R112 Nausea with vomiting, unspecified: Secondary | ICD-10-CM | POA: Diagnosis not present

## 2022-03-02 DIAGNOSIS — R1011 Right upper quadrant pain: Secondary | ICD-10-CM

## 2022-03-02 DIAGNOSIS — K5904 Chronic idiopathic constipation: Secondary | ICD-10-CM | POA: Diagnosis not present

## 2022-03-08 ENCOUNTER — Ambulatory Visit
Admission: RE | Admit: 2022-03-08 | Discharge: 2022-03-08 | Disposition: A | Discharge status: Home / Self Care | Payer: BC Managed Care – PPO | Source: Ambulatory Visit | Attending: Gastroenterology | Admitting: Gastroenterology

## 2022-03-08 DIAGNOSIS — K760 Fatty (change of) liver, not elsewhere classified: Secondary | ICD-10-CM | POA: Diagnosis not present

## 2022-03-08 DIAGNOSIS — R1011 Right upper quadrant pain: Secondary | ICD-10-CM

## 2022-03-08 DIAGNOSIS — R110 Nausea: Secondary | ICD-10-CM | POA: Diagnosis not present

## 2022-03-08 DIAGNOSIS — R112 Nausea with vomiting, unspecified: Secondary | ICD-10-CM

## 2022-03-09 ENCOUNTER — Other Ambulatory Visit: Payer: Self-pay | Admitting: Gastroenterology

## 2022-03-09 DIAGNOSIS — K769 Liver disease, unspecified: Secondary | ICD-10-CM

## 2022-03-10 ENCOUNTER — Ambulatory Visit (HOSPITAL_COMMUNITY)
Admission: RE | Admit: 2022-03-10 | Discharge: 2022-03-10 | Disposition: A | Discharge status: Home / Self Care | Payer: BC Managed Care – PPO | Source: Ambulatory Visit | Attending: Gastroenterology | Admitting: Gastroenterology

## 2022-03-10 DIAGNOSIS — D735 Infarction of spleen: Secondary | ICD-10-CM | POA: Diagnosis not present

## 2022-03-10 DIAGNOSIS — K769 Liver disease, unspecified: Secondary | ICD-10-CM

## 2022-03-10 DIAGNOSIS — K7689 Other specified diseases of liver: Secondary | ICD-10-CM | POA: Diagnosis not present

## 2022-03-10 DIAGNOSIS — I81 Portal vein thrombosis: Secondary | ICD-10-CM | POA: Diagnosis not present

## 2022-03-10 DIAGNOSIS — I871 Compression of vein: Secondary | ICD-10-CM | POA: Diagnosis not present

## 2022-03-10 MED ORDER — GADOBUTROL 1 MMOL/ML IV SOLN
6.0000 mL | Freq: Once | INTRAVENOUS | Status: AC | PRN
  Administered 2022-03-10: 6 mL via INTRAVENOUS

## 2022-03-11 ENCOUNTER — Other Ambulatory Visit (HOSPITAL_COMMUNITY): Payer: Self-pay | Admitting: Gastroenterology

## 2022-03-11 ENCOUNTER — Encounter (HOSPITAL_COMMUNITY): Payer: Self-pay | Admitting: Radiology

## 2022-03-11 ENCOUNTER — Encounter: Payer: Self-pay | Admitting: General Practice

## 2022-03-11 DIAGNOSIS — K769 Liver disease, unspecified: Secondary | ICD-10-CM

## 2022-03-11 DIAGNOSIS — K8689 Other specified diseases of pancreas: Secondary | ICD-10-CM

## 2022-03-11 IMAGING — CR DG FINGER INDEX 2+V*L*
3 series · 3 of 3 positions shown · non-contrast
Comparison: None.

CLINICAL DATA: Laceration to the second digit while chopping food,
initial encounter

EXAM:
LEFT INDEX FINGER 2+V

[finger ap]
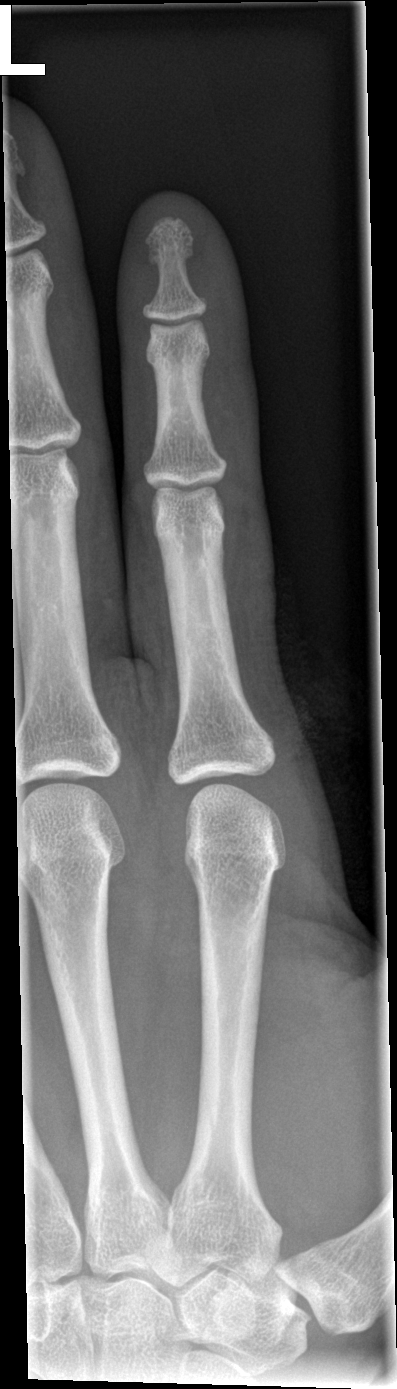

[finger obl]
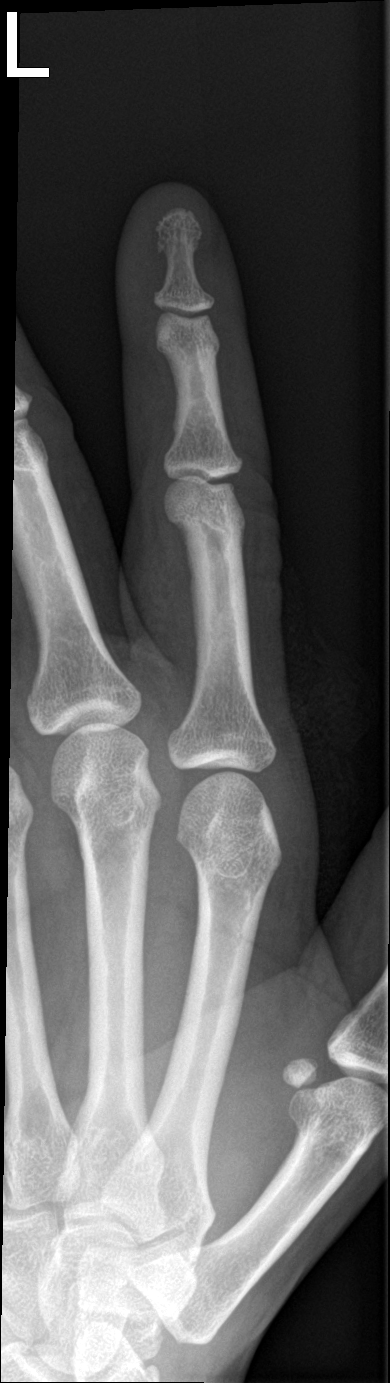

[finger lat]
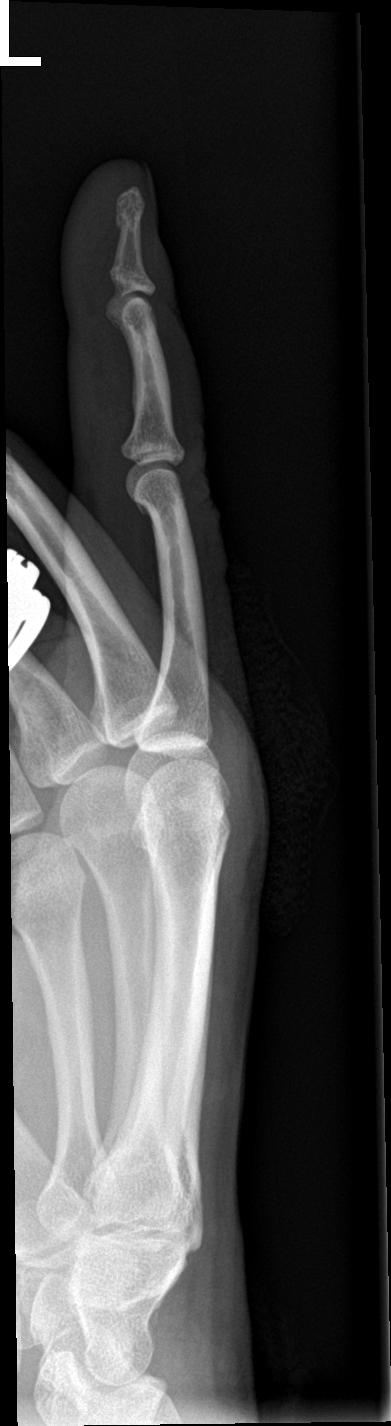

[3 of 3 positions shown; findings below may reference images not displayed]

FINDINGS: Bony structures appear within normal limits. No radiopaque foreign
body is noted. No significant soft tissue changes are seen.
IMPRESSION: No acute abnormality noted.

## 2022-03-11 NOTE — Progress Notes (Signed)
Kaitlin Daft, MD  Allen Kell, NT Ok for US guided liver lesion biopsy.  [place]       Previous Messages    ----- Message ----- From: Allen Kell, NT Sent: 03/11/2022   2:33 PM EST To: Ir Procedure Requests Subject: STAT US Biopsy Liver                          Procedure: STAT US Biopsy Liver   Reason: Liver lesion [K76.9 (ICD-10-CM)]; Pancreatic mass  History: MRI & [place] in chart  Provider: Ronnette Juniper, MD  Contact: [phone]

## 2022-03-13 NOTE — Progress Notes (Signed)
[place] [phone]   [phone]  INITIAL CONSULTATION:  Patient Care Team: Robyne Peers, MD as PCP - General (Family Medicine) Gordy Levan, MD as Consulting Physician (Medical Oncology)  CHIEF COMPLAINTS/PURPOSE OF CONSULTATION:  Pancreatic head mass Liver lesions  ONCOLOGIC HISTORY: Presented with nausea and right upper quadrant pain 03/08/2022: Abdominal US showed multiple hypoechoic masses within the liver are indeterminate.  03/10/2022: MR abdomen: Hypoenhancing mass compatible with pancreatic head adenocarcinoma with numerous targetoid enhancing masses in all segments of the liver, compatible with metastatic disease. The mass severely effaces and nearly occludes the portal vein and splenic vein, and occludes the superior mesenteric vein with some collaterals noted. The mass substantially abuts the proximal transverse duodenum, although overt duodenal invasion is indeterminate. 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 Prominent stool throughout the colon favors constipation. Fluid-fluid level in the gallbladder likely from sludge. 03/14/2022: Establish care with [facility] Hematology/Oncology  HISTORY OF PRESENTING ILLNESS:  Kaitlin Ferrell 57 y.o. female with medical history significant for hypertension, gestational trophoblastic disease and migraines presents to the diagnostic clinic due to recent MRI of the abdomen that is concerning for metastatic pancreatic cancer. She is accompanied by her husband for this visit.   On exam today, Ms. Dicostanzo reports having epigastric and RUQ  abdominal pain for the last few months. She adds that pain is now involving her mid back. She currently takes ibuprofen periodically with minimal relief. She struggles with constipation with a bowel movement every few days. She was recently prescribed stool softeners/laxatives by her gastroenterologist. She reports occasional episodes of nausea without vomiting. She reports decreased appetite and early satiety with an approximate 5 lb weight loss. She denies easy bruising or signs of active bleeding. She denies fevers, chills, sweats, shortness of breath, chest pain or cough. She has no other complaints. Rest of the 10 point ROS is below.  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   treated with 4 cycles of actinomycin D from 12-02-11 thru 01-13-12   H/O molar pregnancy, antepartum    Hypertension    Migraine    Miscarriage     SURGICAL HISTORY: Past Surgical History:  Procedure Laterality Date   BREAST BIOPSY  2001   Left   BREAST LUMPECTOMY     DILATION AND CURETTAGE OF UTERUS  1992, 09/2011   DILATION AND EVACUATION  2011   INSERTION OF MESH N/A 09/03/2013   Procedure: INSERTION OF MESH;  Surgeon: Adin Hector, MD;  Location: [facility];  Service: General;  Laterality: N/A;   PORTACATH PLACEMENT  11/2011   PORTACATH PLACEMENT     REMOVAL  MARCH 2014   VENTRAL HERNIA REPAIR N/A 09/03/2013   Procedure: LAPAROSCOPIC VENTRAL WALL HERNIA REPAIR;  Surgeon: Adin Hector, MD;  Location: [facility];  Service: General;  Laterality: N/A;   WISDOM TOOTH EXTRACTION     in 11th grade    SOCIAL HISTORY: Social History   Socioeconomic History   Marital status: Married    Spouse name:  [place]   Number of children: 2   Years of education: college   Highest education level: Not on file  Occupational History    Comment: C[employer]   Tobacco Use   Smoking status: Never   Smokeless tobacco: Never  Substance and Sexual Activity   Alcohol use: Yes    Alcohol/week: 1.0 standard drink of alcohol    Types: 1 Glasses of wine per week    Comment: occas   Drug use: No   Sexual activity: Not Currently    Birth control/protection: Pill  Other Topics Concern   Not on file  Social History Narrative   Patient is married [name]). Patient works part time at [employer].   Right handed.   Patient has her CMA.   Caffeine- None         Social Determinants of Health   Financial Resource Strain: Not on file  Food Insecurity: No Food Insecurity (03/14/2022)   Hunger Vital Sign    Worried About Running Out of Food in the Last Year: Never true    Ran Out of Food in the Last Year: Never true  Transportation Needs: No Transportation Needs (03/14/2022)   PRAPARE - Hydrologist (Medical): No    Lack of Transportation (Non-Medical): No  Physical Activity: Not on file  Stress: Not on file  Social Connections: Not on file  Intimate Partner Violence: Not At Risk (03/14/2022)   Humiliation, Afraid, Rape, and Kick questionnaire    Fear of Current or Ex-Partner: No    Emotionally Abused: No    Physically Abused: No    Sexually Abused: No    FAMILY HISTORY: Family History  Problem Relation Age of Onset   Breast cancer Mother    Heart attack Mother    Diabetes Father    Breast cancer Maternal Aunt    Breast cancer Maternal Grandmother    Stroke Paternal Grandmother    Breast cancer Maternal Aunt    Alcoholism Other     ALLERGIES:  is allergic to compazine [prochlorperazine edisylate], oxycodone-acetaminophen, prochlorperazine, and labetalol.  MEDICATIONS:  Current Outpatient Medications  Medication Sig Dispense Refill   acetaminophen (TYLENOL) 325 MG tablet Take 650 mg by mouth every 6 (six) hours as needed for headache (if relpax does not help).     amLODipine (NORVASC) 10 MG tablet Take 1 tablet  by mouth daily.     eletriptan (RELPAX) 40 MG tablet Take 1 tablet (40 mg total) by mouth as needed for migraine or headache. May repeat in 2 hours if needed. 15 tablet 6   ibuprofen (ADVIL,MOTRIN) 200 MG tablet Take 400 mg by mouth every 6 (six) hours as needed.     magnesium oxide (MAG-OX) 400 (241.3 MG) MG tablet TAKE 1 TABLET BY MOUTH TWICE A DAY 60 tablet 6   Riboflavin (B2) 100 MG TABS Take 1 tablet by mouth 2 (two) times daily.  12   IUD'S IU by Intrauterine route. (Patient not taking: Reported on 03/14/2022)     topiramate (TOPAMAX) 50 MG tablet 1 am,  2 in the pm (Patient not taking: Reported on 03/14/2022) 90 tablet 8   No current facility-administered medications for this visit.    REVIEW OF SYSTEMS:   Constitutional: ( - ) fevers, ( - )  chills , ( - ) night sweats Eyes: ( - ) blurriness of vision, ( - ) double vision, ( - ) watery eyes Ears, nose, mouth, throat, and face: ( - )  mucositis, ( - ) sore throat Respiratory: ( - ) cough, ( - ) dyspnea, ( - ) wheezes Cardiovascular: ( - ) palpitation, ( - ) chest discomfort, ( - ) lower extremity swelling Gastrointestinal:  ( +) nausea, ( - ) heartburn, ( +) change in bowel habits Skin: ( - ) abnormal skin rashes Lymphatics: ( - ) new lymphadenopathy, ( - ) easy bruising Neurological: ( - ) numbness, ( - ) tingling, ( - ) new weaknesses Behavioral/Psych: ( - ) mood change, ( - ) new changes  All other systems were reviewed with the patient and are negative.  PHYSICAL EXAMINATION: ECOG PERFORMANCE STATUS: 1 - Symptomatic but completely ambulatory  Vitals:   03/14/22 0900  BP: 128/80  Pulse: 94  Resp: 20  Temp: 97.7 F (36.5 C)  SpO2: 98%   Filed Weights   03/14/22 0900  Weight: 160 lb 4.8 oz (72.7 kg)    GENERAL: well appearing female in NAD  SKIN: skin color, texture, turgor are normal, no rashes or significant lesions EYES: conjunctiva are pink and non-injected, sclera clear OROPHARYNX: no exudate, no erythema; lips,  buccal mucosa, and tongue normal  NECK: supple, non-tender LYMPH:  no palpable lymphadenopathy in the cervical, axillary or supraclavicular lymph nodes.  LUNGS: clear to auscultation and percussion with normal breathing effort HEART: regular rate & rhythm and no murmurs and no lower extremity edema ABDOMEN:  normal bowel sounds. Abdomen is tender to palpation in epigastric and RUQ region. No palpable masses or hepatomegaly appreciated.  Musculoskeletal: no cyanosis of digits and no clubbing  PSYCH: alert & oriented x 3, fluent speech NEURO: no focal motor/sensory deficits  LABORATORY DATA:  I have reviewed the data as listed    Latest Ref Rng & Units 08/23/2013   11:05 AM 04/30/2012   12:25 PM 02/21/2012   12:05 PM  CBC  WBC 4.0 - 10.5 K/uL 6.6  5.3  5.6   Hemoglobin 12.0 - 15.0 g/dL 12.7  12.8  11.8   Hematocrit 36.0 - 46.0 % 39.3  38.4  36.0   Platelets 150 - 400 K/uL 217  180  165        Latest Ref Rng & Units 02/21/2012   12:05 PM 01/25/2012   12:08 PM 01/11/2012    8:48 AM  CMP  Glucose 70 - 99 mg/dl 70  89  92   BUN 7.0 - 26.0 mg/dL 11.0  11.0  11.0   Creatinine 0.6 - 1.1 mg/dL 0.9  0.9  1.0   Sodium 136 - 145 mEq/L 140  144  142   Potassium 3.5 - 5.1 mEq/L 3.4  3.7  3.8   Chloride 98 - 107 mEq/L 111  108  109   CO2 22 - 29 mEq/L 20  22  22   $ Calcium 8.4 - 10.4 mg/dL 9.2  9.4  9.3   Total Protein 6.4 - 8.3 g/dL 7.6  7.4  7.3   Total Bilirubin 0.20 - 1.20 mg/dL 0.45  0.71  0.51   Alkaline Phos 40 - 150 U/L 40  39  38   AST 5 - 34 U/L 13  15  14   $ ALT 0 - 55 U/L <6 Repeated and Verified  12  12      RADIOGRAPHIC STUDIES: I have personally reviewed the radiological images as listed and agreed with the findings in the report. MR ABDOMEN WWO CONTRAST  Result Date: 03/10/2022 CLINICAL DATA:  Hepatic lesions on sonography for further characterization.  Abdominal pain extending to the flanks, dietary alterations have not relieve the patient's symptoms. EXAM: MRI ABDOMEN  WITHOUT AND WITH CONTRAST TECHNIQUE: Multiplanar multisequence MR imaging of the abdomen was performed both before and after the administration of intravenous contrast. CONTRAST:  57m GADAVIST GADOBUTROL 1 MMOL/ML IV SOLN COMPARISON:  Abdominal ultrasound 03/08/2022 FINDINGS: Lower chest: Unremarkable Hepatobiliary: Numerous targetoid enhancing masses are scattered in all segments of the liver. Many of these have central hypoenhancing components indicating central necrosis. Index segment 4a lesion 2.3 by 2.5 cm on image 32 series 801. No thrombosis of and intrahepatic portal vein although the portal vein is severely narrowed and either occluded or nearly occluded by the primary pancreatic head mass. No biliary dilatation. Fluid-fluid level in the gallbladder likely from sludge. Pancreas: Hypoenhancing mass within the inferior portion of the pancreatic head, high suspicion for pancreatic adenocarcinoma, with following characteristics: Size: 4.8 by 2.9 by 2.2 cm (image 81, series 801) Location: Head and uncinate process Characterization: Solid Enhancement: Hypoenhancement particularly on arterial phase images, ill-defined infiltrative margins Other Characteristics: N/A Local extent of mass: The mass substantially abuts the proximal transverse duodenum, although overt duodenal invasion is indeterminate. Vascular Involvement: The mass severely effaces and nearly occludes the portal vein, with substantial collateral vessels along the upper anterior omentum. The splenic vein is severely narrowed and nearly occluded. The SMV is occluded. The mass substantially extends around the superior mesenteric artery as on image 78 series 804. Mass effect from the lesion flattens the IVC locally as on image 80 of series 804. Variant hepatic artery anatomy: Present, the celiac trunk appears to supply the left hepatic lobe but a branch from the SMA provides systemic arterial supply to the right hepatic lobe. Bile Duct Involvement: The  common bile duct extends through the region of the mass and is severely effaced in this vicinity, but there is no current biliary dilatation. No dorsal pancreatic duct dilatation Variant biliary anatomy: Not observed Adjacent Nodes: Left periaortic Omental/Peritoneal Disease: Absent Distant Metastases: Liver Mild stranding in the mesentery around the pancreatic head. Spleen:  Unremarkable Adrenals/Urinary Tract:  Unremarkable Stomach/Bowel: Prominent stool throughout the colon favors constipation. Vascular/Lymphatic: Vascular involvement related to the pancreatic mass is as discussed above. Stranding in the retroperitoneum surrounding clustered mildly enlarged left periaortic lymph nodes, measuring up to about 1.2 cm on image 27 series 4. Questionable filling defect proximally in the left ovarian vein, cannot exclude a small amount of left ovarian vein thrombosis for example on image 79 series 803. Other:  No supplemental non-categorized findings. Musculoskeletal: Unremarkable IMPRESSION: 1. 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 The mass severely effaces and nearly occludes the portal vein and splenic vein, and occludes the superior mesenteric vein with some collaterals noted. The mass substantially abuts the proximal transverse duodenum, although overt duodenal invasion is indeterminate. The mass also extends around the superior mesenteric artery and encases the distal CBD although currently no biliary dilatation is observed. 2. Potential partial thrombosis of the proximal left ovarian vein. 3. Variant hepatic artery anatomy is present, the celiac trunk appears to supply the left hepatic lobe but a branch from the SMA provides systemic arterial supply to the right hepatic lobe. 4. Prominent stool throughout the colon favors constipation. 5. Fluid-fluid level in the gallbladder  likely from sludge. These results were called by telephone at the time of interpretation on 03/10/2022 at 1:10 pm to provider Dr. ARonnette Juniper,  who verbally acknowledged these results. Electronically Signed   By: Van Clines M.D.   On: 03/10/2022 13:27   US Abdomen Limited RUQ (LIVER/GB)  Result Date: 03/08/2022 CLINICAL DATA:  Right upper quadrant pain.  Nausea. EXAM: ULTRASOUND ABDOMEN LIMITED RIGHT UPPER QUADRANT COMPARISON:  CT abdomen pelvis 11/25/2011 FINDINGS: Gallbladder: No gallstones or wall thickening visualized. No sonographic Murphy sign noted by sonographer. Common bile duct: Diameter: 2.4 mm Liver: Multiple hypoechoic masses are demonstrated within the liver. Within the left hepatic lobe there is a 1.6 x 1.2 x 0.9 cm hypoechoic mass. Within the right hepatic lobe there is a 1.2 x 1.0 x 1.1 cm hypoechoic mass. Increased hepatic parenchymal echogenicity. Portal vein is patent on color Doppler imaging with normal direction of blood flow towards the liver. Other: None. IMPRESSION: 1. No cholelithiasis or sonographic evidence for acute cholecystitis. 2. Multiple hypoechoic masses within the liver are indeterminate. Possibility of primary hepatic process or malignant process not excluded. Recommend dedicated evaluation with pre and post contrast-enhanced abdominal MRI. 3. Hepatic steatosis. 4. These results will be called to the ordering clinician or representative by the Radiologist Assistant, and communication documented in the PACS or [employer]. Electronically Signed   By: Lovey Newcomer M.D.   On: 03/08/2022 10:17    ASSESSMENT & PLAN Ladrina Chellis is a 57 year old female who presents to the diagnostic clinic for recent MRI of the abdomen that is concerning for metastatic pancreatic cancer to the liver.  We reviewed the recommended diagnostic workup which includes a liver biopsy, CT chest to complete staging and laboratory evaluation.  We discussed that if we are able to confirm  pancreatic cancer that has metastasized to the liver, this is a stage IV diagnosis and the mainstay of treatment will be chemotherapy.  We recommend patient proceed with port placement anticipation to start chemotherapy.  We we will plan to follow-up with the patient in 2 to 3 days after liver biopsy to finalize diagnosis and discuss treatment recommendations.  #Pancreatic head mass #Liver lesions: --Concerning for pancreatic adenocarcinoma to the liver --Need STAT US guided liver biopsy --Labs today to check CBC, CMP, CEA and CA19-9 levels --Need CT chest for staging.  --We will request port placement  --Sent supportive medications including zofran and EMLA cream. 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  --Sent prescription for Eliquis starter pack.   #Epigastric/RUQ/back pain: --Likely secondary to underlying malignancy --Currently taking ibuprofren as needed --Sent prescription for tramadol 50 mg q 6 hours for breakthrough pain. Educated patient on being aggressive with bowel regiment to prevent worsening constipation  #Constipation: --Advised to be aggressive with bowel regimen including miralax and take senokot daily  #Appetite loss/weight loss: --Advised to supplement with protein shakes. --Sent referral to [facility] nutrition team  #Family history of breast cancer: --Due to strong family history of cancer and has undergone genetic testing in the past. With the anticipated diagnosis of pancreatic cancer, we will make a referral to genetics to see if additional testing is required.     Orders Placed This Encounter  Procedures   US BIOPSY (LIVER)    Standing Status:   Future    Standing Expiration Date:   03/15/2023    Order Specific Question:    Lab orders requested (DO NOT place separate lab orders, these will be  automatically ordered during procedure specimen collection):    Answer:   Surgical Pathology    Order Specific Question:   Reason for Exam (SYMPTOM  OR DIAGNOSIS REQUIRED)    Answer:   pancreatic head mass with diffuse liver lesions    Order Specific Question:   Preferred location?    Answer:   [facility]   IR IMAGING GUIDED PORT INSERTION    Standing Status:   Future    Standing Expiration Date:   03/15/2023    Order Specific Question:   Reason for Exam (SYMPTOM  OR DIAGNOSIS REQUIRED)    Answer:   port for chemo    Order Specific Question:   Is the patient pregnant?    Answer:   No    Order Specific Question:   Preferred Imaging Location?    Answer:   [facility]   CBC with Differential (Cancer Center Only)    Standing Status:   Future    Standing Expiration Date:   03/15/2023   CEA (Access)-[facility] ONLY    Standing Status:   Future    Standing Expiration Date:   03/15/2023   CMP ([place] only)    Standing Status:   Future    Standing Expiration Date:   03/15/2023   CA 19.9    Standing Status:   Future    Standing Expiration Date:   03/14/2023    All questions were answered. The patient knows to call the clinic with any problems, questions or concerns.  I have spent a total of 60 minutes minutes of face-to-face and non-face-to-face time, preparing to see the patient, obtaining and/or reviewing separately obtained history, performing a medically appropriate examination, counseling and educating the patient, ordering medications/tests/procedures, referring and communicating with other health care professionals, documenting clinical information in the electronic health record,  and care coordination.   Dede Query, PA-C Department of Hematology/Oncology [place] at [facility] Phone: [phone]  Patient was seen with Dr. Lorenso Courier.  I have read the above note and  personally examined the patient. I agree with the assessment and plan as noted above.  Briefly Mrs. Kaitlin Ferrell is a 57 year old female who presents for evaluation of a pancreatic head mass and numerous hepatic lesions.  At this time findings are concerning for a pancreatic neoplasm with metastatic spread.  Will order an ultrasound-guided needle biopsy of one of the liver lesions immediately.  Additionally we will move forward with port placement and complete staging scans with a CT scan of the chest.  We will order additional labs to include tumor markers with CA 19-9 and CEA.  Will prescribe tramadol for pain with strong recommendations for constipation prevention.  The patient voiced understanding of the plan moving forward.  Once we have the biopsy results we will plan to have the patient back to discuss treatment options and additional steps moving forward.   Ledell Peoples, MD Department of Hematology/Oncology [place] at [facility] Phone: [phone] Pager: [phone] Email: [email or web address]

## 2022-03-14 ENCOUNTER — Inpatient Hospital Stay: Payer: BC Managed Care – PPO | Attending: Physician Assistant | Admitting: Physician Assistant

## 2022-03-14 ENCOUNTER — Inpatient Hospital Stay: Payer: BC Managed Care – PPO

## 2022-03-14 ENCOUNTER — Encounter: Payer: Self-pay | Admitting: Physician Assistant

## 2022-03-14 ENCOUNTER — Encounter: Payer: Self-pay | Admitting: General Practice

## 2022-03-14 VITALS — BP 128/80 | HR 94 | Temp 97.7°F | Resp 20 | Wt 160.3 lb

## 2022-03-14 DIAGNOSIS — Z803 Family history of malignant neoplasm of breast: Secondary | ICD-10-CM | POA: Diagnosis not present

## 2022-03-14 DIAGNOSIS — R630 Anorexia: Secondary | ICD-10-CM | POA: Diagnosis not present

## 2022-03-14 DIAGNOSIS — R1013 Epigastric pain: Secondary | ICD-10-CM

## 2022-03-14 DIAGNOSIS — K769 Liver disease, unspecified: Secondary | ICD-10-CM | POA: Diagnosis not present

## 2022-03-14 DIAGNOSIS — I82890 Acute embolism and thrombosis of other specified veins: Secondary | ICD-10-CM

## 2022-03-14 DIAGNOSIS — Z5111 Encounter for antineoplastic chemotherapy: Secondary | ICD-10-CM | POA: Insufficient documentation

## 2022-03-14 DIAGNOSIS — R634 Abnormal weight loss: Secondary | ICD-10-CM | POA: Insufficient documentation

## 2022-03-14 DIAGNOSIS — Z7901 Long term (current) use of anticoagulants: Secondary | ICD-10-CM | POA: Diagnosis not present

## 2022-03-14 DIAGNOSIS — K5909 Other constipation: Secondary | ICD-10-CM

## 2022-03-14 DIAGNOSIS — C787 Secondary malignant neoplasm of liver and intrahepatic bile duct: Secondary | ICD-10-CM | POA: Insufficient documentation

## 2022-03-14 DIAGNOSIS — K8689 Other specified diseases of pancreas: Secondary | ICD-10-CM

## 2022-03-14 DIAGNOSIS — Z809 Family history of malignant neoplasm, unspecified: Secondary | ICD-10-CM

## 2022-03-14 DIAGNOSIS — I81 Portal vein thrombosis: Secondary | ICD-10-CM | POA: Diagnosis not present

## 2022-03-14 DIAGNOSIS — C250 Malignant neoplasm of head of pancreas: Secondary | ICD-10-CM | POA: Insufficient documentation

## 2022-03-14 DIAGNOSIS — K5900 Constipation, unspecified: Secondary | ICD-10-CM | POA: Diagnosis not present

## 2022-03-14 DIAGNOSIS — K869 Disease of pancreas, unspecified: Secondary | ICD-10-CM

## 2022-03-14 DIAGNOSIS — Z452 Encounter for adjustment and management of vascular access device: Secondary | ICD-10-CM | POA: Insufficient documentation

## 2022-03-14 LAB — CMP (CANCER CENTER ONLY)
ALT: 18 U/L (ref 0–44)
AST: 21 U/L (ref 15–41)
Albumin: 4.6 g/dL (ref 3.5–5.0)
Alkaline Phosphatase: 76 U/L (ref 38–126)
Anion gap: 8 (ref 5–15)
BUN: 11 mg/dL (ref 6–20)
CO2: 27 mmol/L (ref 22–32)
Calcium: 10 mg/dL (ref 8.9–10.3)
Chloride: 104 mmol/L (ref 98–111)
Creatinine: 0.77 mg/dL (ref 0.44–1.00)
GFR, Estimated: >60 mL/min (ref >60)
Glucose, Bld: 94 mg/dL (ref 70–99)
Potassium: 3.8 mmol/L (ref 3.5–5.1)
Sodium: 139 mmol/L (ref 135–145)
Total Bilirubin: 0.6 mg/dL (ref 0.3–1.2)
Total Protein: 7.8 g/dL (ref 6.5–8.1)

## 2022-03-14 LAB — CBC WITH DIFFERENTIAL (CANCER CENTER ONLY)
Abs Immature Granulocytes: 0 10*3/uL (ref 0.00–0.07)
Basophils Absolute: 0 10*3/uL (ref 0.0–0.1)
Basophils Relative: 0 %
Eosinophils Absolute: 0.1 10*3/uL (ref 0.0–0.5)
Eosinophils Relative: 2 %
HCT: 40.8 % (ref 36.0–46.0)
Hemoglobin: 13.3 g/dL (ref 12.0–15.0)
Immature Granulocytes: 0 %
Lymphocytes Relative: 32 %
Lymphs Abs: 1.7 10*3/uL (ref 0.7–4.0)
MCH: 27.9 pg (ref 26.0–34.0)
MCHC: 32.6 g/dL (ref 30.0–36.0)
MCV: 85.5 fL (ref 80.0–100.0)
Monocytes Absolute: 0.4 10*3/uL (ref 0.1–1.0)
Monocytes Relative: 8 %
Neutro Abs: 2.9 10*3/uL (ref 1.7–7.7)
Neutrophils Relative %: 58 %
Platelet Count: 202 10*3/uL (ref 150–400)
RBC: 4.77 MIL/uL (ref 3.87–5.11)
RDW: 13.2 % (ref 11.5–15.5)
WBC Count: 5.1 10*3/uL (ref 4.0–10.5)
nRBC: 0 % (ref 0.0–0.2)

## 2022-03-14 LAB — CEA (ACCESS): CEA (CHCC): 53.61 ng/mL — ABNORMAL HIGH (ref 0.00–5.00)

## 2022-03-14 MED ORDER — APIXABAN (ELIQUIS) VTE STARTER PACK (10MG AND 5MG): ORAL_TABLET | ORAL | 0 refills | Status: DC

## 2022-03-14 MED ORDER — ONDANSETRON HCL 8 MG PO TABS: 8.0000 mg | ORAL_TABLET | Freq: Three times a day (TID) | ORAL | 0 refills | Status: DC | PRN

## 2022-03-14 MED ORDER — LIDOCAINE-PRILOCAINE 2.5-2.5 % EX CREA: 1.0000 | TOPICAL_CREAM | CUTANEOUS | 0 refills | Status: DC | PRN

## 2022-03-14 NOTE — Progress Notes (Signed)
I met with Kaitlin Ferrell and her husband after  her consultation with Dede Query, PA-C and Dr Lorenso Courier  I explained my role as a nurse navigator and provided my contact information. I explained the services provided at [facility] and provided written information.  I explained the alight grant and let  her know one of the financial advisors will reach out to  her at the time of her chemo education class. I briefly explained insertion and care of a port a cath.  I showed a sample of the port a cath.  I told her that she will be scheduled for chemotherapy education class prior to receiving chemotherapy.   I told her that interventional radiology will reach out to her to schedule her liver biopsy and port placement.  I let her know I will schedule follow up appt after I know the date of the biopsy. All questions were answered. She verbalized understanding.

## 2022-03-14 NOTE — Progress Notes (Signed)
Arne Cleveland, MD  Allen Kell, NT Ok  [place] core liver met  [facility]       Previous Messages    ----- Message ----- From: Allen Kell, NT Sent: 03/11/2022   2:33 PM EST To: Ir Procedure Requests Subject: STAT US Biopsy Liver                          Procedure: STAT US Biopsy Liver   Reason: Liver lesion [K76.9 (ICD-10-CM)]; Pancreatic mass  History: MRI & [place] in chart  Provider: Ronnette Juniper, MD  Contact: [phone]

## 2022-03-14 NOTE — Progress Notes (Signed)
I spoke with Ms Lanahan.  She is aware of her f/u appt with Dede Query, PA-C and Dr Lorenso Courier on 2/9 at [facility]. She states her insurance will not pay for brand name Eliquis.  I advised her to go on the [employer] website to see if she will qualify for assistance.  She will let me know.

## 2022-03-15 ENCOUNTER — Inpatient Hospital Stay: Payer: BC Managed Care – PPO | Admitting: Licensed Clinical Social Worker

## 2022-03-15 DIAGNOSIS — K8689 Other specified diseases of pancreas: Secondary | ICD-10-CM

## 2022-03-15 LAB — CANCER ANTIGEN 19-9: CA 19-9: 7860 U/mL — ABNORMAL HIGH (ref 0–35)

## 2022-03-15 NOTE — Progress Notes (Signed)
[place] Work  Initial Assessment   Kaitlin Ferrell is a 57 y.o. year old female contacted by phone. Clinical Social Work was referred by medical provider for assessment of psychosocial needs.   SDOH (Social Determinants of Health) assessments performed: Yes   SDOH Screenings   Food Insecurity: No Food Insecurity (03/14/2022)  Housing: Low Risk  (03/14/2022)  Transportation Needs: No Transportation Needs (03/14/2022)  Utilities: Not At Risk (03/14/2022)  Depression (PHQ2-9): Low Risk  (03/14/2022)  Tobacco Use: Low Risk  (03/14/2022)     Distress Screen completed: No     No data to display            Family/Social Information:  Housing Arrangement: patient lives with her husband and their 70 year old daughter.   Family members/support persons in your life? The couple also has a son who is presently in the [employer].  Locally there is additional family to provide support as needed. Transportation concerns: no  Employment: Unemployed Pt was a [employer] until 2021.  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  Income source: Employment Financial concerns:  No concerns at present, but pt is aware of financial resources should concerns arise during treatment. Type of concern: None Food access concerns: no Religious or spiritual practice: Yes-Christian Services Currently in place:  none  Coping/ Adjustment to diagnosis: Patient understands treatment plan and what happens next? yes Concerns about diagnosis and/or treatment: Overwhelmed by information Patient reported stressors: Children, Anxiety/ nervousness, and Adjusting to my illness Hopes and/or priorities: Pt's priority is to complete diagnostic testing to move forward w/ treatment w/ the hope of positive results. Patient enjoys time with family/ friends Current coping skills/ strengths: Capable of independent living , Motivation for treatment/growth , and Supportive family/friends      SUMMARY: Current SDOH Barriers:  No barriers identified at this time.  Clinical Social Work Clinical Goal(s):  No clinical social work goals at this time  Interventions: Discussed common feeling and emotions when being diagnosed with cancer, and the importance of support during treatment Informed patient of the support team roles and support services at [facility] Provided [place] contact information and encouraged patient to call with any questions or concerns Provided patient with information about having conversations w/ children and an organization that can provide a Nutritional therapist for her daughter.  Discussed financial resources which pt will reach out for if the need arises.   Follow Up Plan: Patient will contact CSW with any support or resource needs Patient verbalizes understanding of plan: Yes    Henriette Combs, LCSW

## 2022-03-17 ENCOUNTER — Telehealth: Payer: Self-pay | Admitting: Physician Assistant

## 2022-03-17 ENCOUNTER — Ambulatory Visit (HOSPITAL_COMMUNITY)
Admission: RE | Admit: 2022-03-17 | Discharge: 2022-03-17 | Disposition: A | Discharge status: Home / Self Care | Payer: BC Managed Care – PPO | Source: Ambulatory Visit | Attending: Physician Assistant | Admitting: Physician Assistant

## 2022-03-17 DIAGNOSIS — K8689 Other specified diseases of pancreas: Secondary | ICD-10-CM | POA: Diagnosis not present

## 2022-03-17 DIAGNOSIS — K769 Liver disease, unspecified: Secondary | ICD-10-CM | POA: Diagnosis not present

## 2022-03-17 DIAGNOSIS — R911 Solitary pulmonary nodule: Secondary | ICD-10-CM | POA: Diagnosis not present

## 2022-03-17 MED ORDER — IOHEXOL 300 MG/ML  SOLN
75.0000 mL | Freq: Once | INTRAMUSCULAR | Status: AC | PRN
  Administered 2022-03-17: 75 mL via INTRAVENOUS

## 2022-03-17 MED ORDER — TRAMADOL HCL 50 MG PO TABS: 50.0000 mg | ORAL_TABLET | Freq: Four times a day (QID) | ORAL | 0 refills | Status: DC | PRN

## 2022-03-17 MED ORDER — SODIUM CHLORIDE (PF) 0.9 % IJ SOLN
INTRAMUSCULAR | Status: AC
  Filled 2022-03-17: qty 50

## 2022-03-17 NOTE — Telephone Encounter (Signed)
I called Kaitlin Ferrell to review the lab results from 03/14/2022. Patient requested pain medication so sent Tramadol 50 mg q 6 hours to pharmacy on file.

## 2022-03-17 NOTE — Progress Notes (Signed)
I spoke with [place] regarding her eliquis prescription.  She applied for the patient assistance and received numbers to apply to the prescription.  She will let me know if the starter pack copay is $10.  She also states she needs a letter to [facility] stating her current medical situation so she can withdraw from classes based on medical need.  Ms [place] called.  She was able to use the $10 copay for her Eliquis starter pack.  I emailed the letter for [facility] to her.

## 2022-03-20 ENCOUNTER — Other Ambulatory Visit: Payer: Self-pay | Admitting: Radiology

## 2022-03-20 DIAGNOSIS — K769 Liver disease, unspecified: Secondary | ICD-10-CM

## 2022-03-22 ENCOUNTER — Ambulatory Visit (HOSPITAL_COMMUNITY)
Admission: RE | Admit: 2022-03-22 | Discharge: 2022-03-22 | Disposition: A | Discharge status: Home / Self Care | Payer: BC Managed Care – PPO | Source: Ambulatory Visit | Attending: Physician Assistant | Admitting: Physician Assistant

## 2022-03-22 ENCOUNTER — Encounter (HOSPITAL_COMMUNITY): Payer: Self-pay

## 2022-03-22 ENCOUNTER — Other Ambulatory Visit: Payer: Self-pay

## 2022-03-22 ENCOUNTER — Other Ambulatory Visit: Payer: Self-pay | Admitting: Physician Assistant

## 2022-03-22 DIAGNOSIS — R630 Anorexia: Secondary | ICD-10-CM

## 2022-03-22 DIAGNOSIS — Z0000 Encounter for general adult medical examination without abnormal findings: Secondary | ICD-10-CM | POA: Diagnosis not present

## 2022-03-22 DIAGNOSIS — K5909 Other constipation: Secondary | ICD-10-CM

## 2022-03-22 DIAGNOSIS — C787 Secondary malignant neoplasm of liver and intrahepatic bile duct: Secondary | ICD-10-CM | POA: Diagnosis not present

## 2022-03-22 DIAGNOSIS — Z452 Encounter for adjustment and management of vascular access device: Secondary | ICD-10-CM | POA: Diagnosis not present

## 2022-03-22 DIAGNOSIS — C259 Malignant neoplasm of pancreas, unspecified: Secondary | ICD-10-CM | POA: Diagnosis not present

## 2022-03-22 DIAGNOSIS — F430 Acute stress reaction: Secondary | ICD-10-CM | POA: Diagnosis not present

## 2022-03-22 DIAGNOSIS — K769 Liver disease, unspecified: Secondary | ICD-10-CM

## 2022-03-22 DIAGNOSIS — R160 Hepatomegaly, not elsewhere classified: Secondary | ICD-10-CM | POA: Diagnosis not present

## 2022-03-22 DIAGNOSIS — I82890 Acute embolism and thrombosis of other specified veins: Secondary | ICD-10-CM

## 2022-03-22 DIAGNOSIS — I10 Essential (primary) hypertension: Secondary | ICD-10-CM | POA: Diagnosis not present

## 2022-03-22 DIAGNOSIS — R1013 Epigastric pain: Secondary | ICD-10-CM

## 2022-03-22 DIAGNOSIS — F410 Panic disorder [episodic paroxysmal anxiety] without agoraphobia: Secondary | ICD-10-CM | POA: Diagnosis not present

## 2022-03-22 DIAGNOSIS — K8689 Other specified diseases of pancreas: Secondary | ICD-10-CM

## 2022-03-22 DIAGNOSIS — G4709 Other insomnia: Secondary | ICD-10-CM | POA: Diagnosis not present

## 2022-03-22 DIAGNOSIS — C801 Malignant (primary) neoplasm, unspecified: Secondary | ICD-10-CM | POA: Diagnosis not present

## 2022-03-22 DIAGNOSIS — Z809 Family history of malignant neoplasm, unspecified: Secondary | ICD-10-CM

## 2022-03-22 HISTORY — PX: IR IMAGING GUIDED PORT INSERTION: IMG5740

## 2022-03-22 HISTORY — PX: IR US LIVER BIOPSY: IMG936

## 2022-03-22 LAB — COMPREHENSIVE METABOLIC PANEL
ALT: 24 U/L (ref 0–44)
AST: 32 U/L (ref 15–41)
Albumin: 4.9 g/dL (ref 3.5–5.0)
Alkaline Phosphatase: 64 U/L (ref 38–126)
Anion gap: 10 (ref 5–15)
BUN: 11 mg/dL (ref 6–20)
CO2: 24 mmol/L (ref 22–32)
Calcium: 9.3 mg/dL (ref 8.9–10.3)
Chloride: 104 mmol/L (ref 98–111)
Creatinine, Ser: 0.75 mg/dL (ref 0.44–1.00)
GFR, Estimated: >60 mL/min (ref >60)
Glucose, Bld: 88 mg/dL (ref 70–99)
Potassium: 3.7 mmol/L (ref 3.5–5.1)
Sodium: 138 mmol/L (ref 135–145)
Total Bilirubin: 0.7 mg/dL (ref 0.3–1.2)
Total Protein: 8.3 g/dL — ABNORMAL HIGH (ref 6.5–8.1)

## 2022-03-22 LAB — CBC WITH DIFFERENTIAL/PLATELET
Abs Immature Granulocytes: 0.02 10*3/uL (ref 0.00–0.07)
Basophils Absolute: 0 10*3/uL (ref 0.0–0.1)
Basophils Relative: 1 %
Eosinophils Absolute: 0.1 10*3/uL (ref 0.0–0.5)
Eosinophils Relative: 2 %
HCT: 40.7 % (ref 36.0–46.0)
Hemoglobin: 12.8 g/dL (ref 12.0–15.0)
Immature Granulocytes: 0 %
Lymphocytes Relative: 33 %
Lymphs Abs: 1.6 10*3/uL (ref 0.7–4.0)
MCH: 27.8 pg (ref 26.0–34.0)
MCHC: 31.4 g/dL (ref 30.0–36.0)
MCV: 88.5 fL (ref 80.0–100.0)
Monocytes Absolute: 0.4 10*3/uL (ref 0.1–1.0)
Monocytes Relative: 8 %
Neutro Abs: 2.8 10*3/uL (ref 1.7–7.7)
Neutrophils Relative %: 56 %
Platelets: 155 10*3/uL (ref 150–400)
RBC: 4.6 MIL/uL (ref 3.87–5.11)
RDW: 13.2 % (ref 11.5–15.5)
WBC: 4.9 10*3/uL (ref 4.0–10.5)
nRBC: 0 % (ref 0.0–0.2)

## 2022-03-22 LAB — PROTIME-INR
INR: 1.1 (ref 0.8–1.2)
Prothrombin Time: 13.8 s (ref 11.4–15.2)

## 2022-03-22 MED ORDER — LIDOCAINE HCL 1 % IJ SOLN
INTRAMUSCULAR | Status: AC
  Filled 2022-03-22: qty 20

## 2022-03-22 MED ORDER — LIDOCAINE-EPINEPHRINE 1 %-1:100000 IJ SOLN
INTRAMUSCULAR | Status: AC | PRN
  Administered 2022-03-22: 20 mL
  Administered 2022-03-22: 10 mL

## 2022-03-22 MED ORDER — HEPARIN SOD (PORK) LOCK FLUSH 100 UNIT/ML IV SOLN
INTRAVENOUS | Status: AC
  Administered 2022-03-22: 500 [IU]
  Filled 2022-03-22: qty 5

## 2022-03-22 MED ORDER — FENTANYL CITRATE (PF) 100 MCG/2ML IJ SOLN
INTRAMUSCULAR | Status: AC | PRN
  Administered 2022-03-22 (×4): 50 ug via INTRAVENOUS

## 2022-03-22 MED ORDER — MIDAZOLAM HCL 2 MG/2ML IJ SOLN
INTRAMUSCULAR | Status: AC
  Filled 2022-03-22: qty 4

## 2022-03-22 MED ORDER — FENTANYL CITRATE (PF) 100 MCG/2ML IJ SOLN
INTRAMUSCULAR | Status: AC
  Filled 2022-03-22: qty 2

## 2022-03-22 MED ORDER — MIDAZOLAM HCL 2 MG/2ML IJ SOLN
INTRAMUSCULAR | Status: AC | PRN
  Administered 2022-03-22 (×5): 1 mg via INTRAVENOUS

## 2022-03-22 MED ORDER — MIDAZOLAM HCL 2 MG/2ML IJ SOLN
INTRAMUSCULAR | Status: AC
  Filled 2022-03-22: qty 2

## 2022-03-22 MED ORDER — SODIUM CHLORIDE 0.9 % IV SOLN: INTRAVENOUS | Status: DC

## 2022-03-22 MED ORDER — LIDOCAINE-EPINEPHRINE 1 %-1:100000 IJ SOLN
INTRAMUSCULAR | Status: AC
  Filled 2022-03-22: qty 1

## 2022-03-22 MED ORDER — MIDAZOLAM HCL 5 MG/5ML IJ SOLN
INTRAMUSCULAR | Status: AC | PRN
  Administered 2022-03-22: 1 mg via INTRAVENOUS

## 2022-03-22 MED ORDER — GELATIN ABSORBABLE 12-7 MM EX MISC
CUTANEOUS | Status: AC
  Filled 2022-03-22: qty 1

## 2022-03-22 NOTE — H&P (Signed)
Chief Complaint: Patient was seen in consultation today for port and liver lesion bx   Referring Physician(s): Lincoln Brigham  Supervising Physician: Corrie Mckusick  Patient Status: [facility] - Out-pt  History of Present Illness: Kaitlin Ferrell is a 57 y.o. 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 She has pancreatic head mass and liver lesions. She is referred for biopsy of liver lesion as well as port placement. PMHx, meds, labs, imaging, allergies reviewed. Feels well, no recent fevers, chills, illness. Has been NPO today as directed.   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   treated with 4 cycles of actinomycin D from 12-02-11 thru 01-13-12   H/O molar pregnancy, antepartum    Hypertension    Migraine    Miscarriage     Past Surgical History:  Procedure Laterality Date   BREAST BIOPSY  2001   Left   BREAST LUMPECTOMY     DILATION AND CURETTAGE OF UTERUS  1992, 09/2011   DILATION AND EVACUATION  2011   INSERTION OF MESH N/A 09/03/2013   Procedure: INSERTION OF MESH;  Surgeon: Adin Hector, MD;  Location: [facility] ORS;  Service: General;  Laterality: N/A;   PORTACATH PLACEMENT  11/2011   PORTACATH PLACEMENT     REMOVAL  MARCH 2014   VENTRAL HERNIA REPAIR N/A 09/03/2013   Procedure: LAPAROSCOPIC VENTRAL WALL HERNIA REPAIR;  Surgeon: Adin Hector, MD;  Location: [facility] ORS;  Service: General;  Laterality: N/A;   WISDOM TOOTH EXTRACTION     in 11th grade    Allergies: Compazine [prochlorperazine edisylate], Oxycodone-acetaminophen, Prochlorperazine, and Labetalol  Medications: Prior to Admission medications   Medication Sig Start Date End Date Taking? Authorizing Provider  acetaminophen (TYLENOL) 325 MG tablet Take 650 mg by mouth every 6 (six) hours as needed for headache (if relpax does not help).     [provider]  amLODipine (NORVASC) 10 MG tablet Take 1 tablet by mouth daily. 12/08/21   [provider]  APIXABAN Arne Cleveland) VTE STARTER PACK (10MG AND 5MG) Take as directed on package: start with two-8m tablets twice daily for 7 days. On day 8, switch to one-579mtablet twice daily. Start after liver biopsy and port placement. 03/14/22   ThLincoln BrighamPA-C  eletriptan (RELPAX) 40 MG tablet Take 1 tablet (40 mg total) by mouth as needed for migraine or headache. May repeat in 2 hours if needed. 09/22/14   MaDennie BibleNP  ibuprofen (ADVIL,MOTRIN) 200 MG tablet Take 400 mg by mouth every 6 (six) hours as needed.    [provider]  lidocaine-prilocaine (EMLA) cream Apply 1 Application topically as needed. 03/14/22   ThDede Query, PA-C  magnesium oxide (MAG-OX) 400 (241.3 MG) MG tablet TAKE 1 TABLET BY MOUTH TWICE A DAY 11/16/14   MaDennie BibleNP  ondansetron (ZOFRAN) 8 MG tablet Take 1 tablet (8 mg total) by mouth every 8 (eight) hours as needed for nausea or vomiting. 03/14/22   ThLincoln BrighamPA-C  Riboflavin (B2) 100 MG TABS Take 1 tablet by mouth 2 (two) times daily. 02/05/14   [provider]  traMADol (ULTRAM) 50 MG tablet Take 1 tablet (50 mg total) by mouth every 6 (six) hours as needed. 03/17/22   ThLincoln BrighamPA-C     Family History  Problem Relation Age of Onset  Breast cancer Mother    Heart attack Mother    Diabetes Father    Breast cancer Maternal Aunt    Breast cancer Maternal Grandmother    Stroke Paternal Grandmother    Breast cancer Maternal Aunt    Alcoholism Other     Social History   Socioeconomic History   Marital status: Married    Spouse name: [name]   Number of children: 2   Years of education: college   Highest education level: Not on file  Occupational History    Comment: [employer]  Tobacco Use   Smoking status: Never   Smokeless tobacco: Never  Substance and Sexual Activity   Alcohol use:  Yes    Alcohol/week: 1.0 standard drink of alcohol    Types: 1 Glasses of wine per week    Comment: occas   Drug use: No   Sexual activity: Not Currently    Birth control/protection: Pill  Other Topics Concern   Not on file  Social History Narrative   Patient is married [name]). Patient works part time at [employer].   Right handed.   Patient has her CMA.   Caffeine- None         Social Determinants of Health   Financial Resource Strain: Not on file  Food Insecurity: No Food Insecurity (03/14/2022)   Hunger Vital Sign    Worried About Running Out of Food in the Last Year: Never true    Ran Out of Food in the Last Year: Never true  Transportation Needs: No Transportation Needs (03/14/2022)   PRAPARE - Hydrologist (Medical): No    Lack of Transportation (Non-Medical): No  Physical Activity: Not on file  Stress: Not on file  Social Connections: Not on file    Review of Systems: A 12 point ROS discussed and pertinent positives are indicated in the HPI above.  All other systems are negative.  Review of Systems  Vital Signs: BP 134/84   Pulse 69   Temp 98.2 F (36.8 C) (Oral)   Resp 18   Ht 5' 2"$  (1.575 m)   Wt 160 lb 4.8 oz (72.7 kg)   SpO2 99%   BMI 29.32 kg/m   Physical Exam Constitutional:      Appearance: Normal appearance.  HENT:     Mouth/Throat:     Mouth: Mucous membranes are moist.     Pharynx: Oropharynx is clear.  Cardiovascular:     Rate and Rhythm: Normal rate and regular rhythm.     Heart sounds: Normal heart sounds.  Pulmonary:     Effort: Pulmonary effort is normal. No respiratory distress.     Breath sounds: Normal breath sounds.  Skin:    General: Skin is warm and dry.  Neurological:     General: No focal deficit present.     Mental Status: She is alert and oriented to person, place, and time.  Psychiatric:        Mood and Affect: Mood normal.        Thought Content: Thought content normal.      Imaging: CT Chest W Contrast  Result Date: 03/18/2022 CLINICAL DATA:  57 year old female with history of pancreatic cancer. Staging examination. * Tracking Code: BO * EXAM: CT CHEST WITH CONTRAST TECHNIQUE: Multidetector CT imaging of the chest was performed during intravenous contrast administration. RADIATION DOSE REDUCTION: This exam was performed according to the departmental dose-optimization program which includes automated exposure control, adjustment of the  mA and/or kV according to patient size and/or use of iterative reconstruction technique. CONTRAST:  63m OMNIPAQUE IOHEXOL 300 MG/ML  SOLN COMPARISON:  None Available. FINDINGS: Cardiovascular: Heart size is normal. There is no significant pericardial fluid, thickening or pericardial calcification. Aortic atherosclerosis. No definite coronary artery calcifications. Mediastinum/Nodes: No pathologically enlarged mediastinal or hilar lymph nodes. Esophagus is unremarkable in appearance. No axillary lymphadenopathy. Lungs/Pleura: 3 mm left lower lobe pulmonary nodule (axial image 93 of series 5), nonspecific. No other larger more suspicious appearing pulmonary nodules or masses are noted. No acute consolidative airspace disease. No pleural effusions. Upper Abdomen: Numerous hypovascular hepatic lesions scattered throughout the visualized liver better demonstrated on recent abdominal MRI 03/10/2022, compatible with metastatic disease, with the largest of these in segment 4A on today's examination measuring at least 2.4 x 2.4 cm, similar to the recent prior study. Musculoskeletal: There are no aggressive appearing lytic or blastic lesions noted in the visualized portions of the skeleton. IMPRESSION: 1. No definitive findings to suggest metastatic disease to the thorax. 2. 3 mm left lower lobe pulmonary nodule, nonspecific, but statistically likely benign. Continued attention on routine follow-up imaging is recommended to exclude the possibility of a  metastatic lesion. 3. Aortic atherosclerosis. 4. Widespread metastatic disease to the liver better demonstrated on recent abdominal MRI 03/10/2022, please see that report for full description. Aortic Atherosclerosis (ICD10-I70.0). Electronically Signed   By: DVinnie LangtonM.D.   On: 03/18/2022 08:50   MR ABDOMEN WWO CONTRAST  Result Date: 03/10/2022 CLINICAL DATA:  Hepatic lesions on sonography for further characterization. Abdominal pain extending to the flanks, dietary alterations have not relieve the patient's symptoms. EXAM: MRI ABDOMEN WITHOUT AND WITH CONTRAST TECHNIQUE: Multiplanar multisequence MR imaging of the abdomen was performed both before and after the administration of intravenous contrast. CONTRAST:  650mGADAVIST GADOBUTROL 1 MMOL/ML IV SOLN COMPARISON:  Abdominal ultrasound 03/08/2022 FINDINGS: Lower chest: Unremarkable Hepatobiliary: Numerous targetoid enhancing masses are scattered in all segments of the liver. Many of these have central hypoenhancing components indicating central necrosis. Index segment 4a lesion 2.3 by 2.5 cm on image 32 series 801. No thrombosis of and intrahepatic portal vein although the portal vein is severely narrowed and either occluded or nearly occluded by the primary pancreatic head mass. No biliary dilatation. Fluid-fluid level in the gallbladder likely from sludge. Pancreas: Hypoenhancing mass within the inferior portion of the pancreatic head, high suspicion for pancreatic adenocarcinoma, with following characteristics: Size: 4.8 by 2.9 by 2.2 cm (image 81, series 801) Location: Head and uncinate process Characterization: Solid Enhancement: Hypoenhancement particularly on arterial phase images, ill-defined infiltrative margins Other Characteristics: N/A Local extent of mass: The mass substantially abuts the proximal transverse duodenum, although overt duodenal invasion is indeterminate. Vascular Involvement: The mass severely effaces and nearly occludes the  portal vein, with substantial collateral vessels along the upper anterior omentum. The splenic vein is severely narrowed and nearly occluded. The SMV is occluded. The mass substantially extends around the superior mesenteric artery as on image 78 series 804. Mass effect from the lesion flattens the IVC locally as on image 80 of series 804. Variant hepatic artery anatomy: Present, the celiac trunk appears to supply the left hepatic lobe but a branch from the SMA provides systemic arterial supply to the right hepatic lobe. Bile Duct Involvement: The common bile duct extends through the region of the mass and is severely effaced in this vicinity, but there is no current biliary dilatation. No dorsal pancreatic duct dilatation Variant biliary anatomy: Not  observed Adjacent Nodes: Left periaortic Omental/Peritoneal Disease: Absent Distant Metastases: Liver Mild stranding in the mesentery around the pancreatic head. Spleen:  Unremarkable Adrenals/Urinary Tract:  Unremarkable Stomach/Bowel: Prominent stool throughout the colon favors constipation. Vascular/Lymphatic: Vascular involvement related to the pancreatic mass is as discussed above. Stranding in the retroperitoneum surrounding clustered mildly enlarged left periaortic lymph nodes, measuring up to about 1.2 cm on image 27 series 4. Questionable filling defect proximally in the left ovarian vein, cannot exclude a small amount of left ovarian vein thrombosis for example on image 79 series 803. Other:  No supplemental non-categorized findings. Musculoskeletal: Unremarkable IMPRESSION: 1. 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 The mass severely effaces and nearly occludes the portal vein and splenic vein, and occludes the superior mesenteric vein with some collaterals noted. The mass substantially abuts the proximal transverse duodenum,  although overt duodenal invasion is indeterminate. The mass also extends around the superior mesenteric artery and encases the distal CBD although currently no biliary dilatation is observed. 2. Potential partial thrombosis of the proximal left ovarian vein. 3. Variant hepatic artery anatomy is present, the celiac trunk appears to supply the left hepatic lobe but a branch from the SMA provides systemic arterial supply to the right hepatic lobe. 4. Prominent stool throughout the colon favors constipation. 5. Fluid-fluid level in the gallbladder likely from sludge. These results were called by telephone at the time of interpretation on 03/10/2022 at 1:10 pm to provider Dr. Ronnette Juniper , who verbally acknowledged these results. Electronically Signed   By: Van Clines M.D.   On: 03/10/2022 13:27   US Abdomen Limited RUQ (LIVER/GB)  Result Date: 03/08/2022 CLINICAL DATA:  Right upper quadrant pain.  Nausea. EXAM: ULTRASOUND ABDOMEN LIMITED RIGHT UPPER QUADRANT COMPARISON:  CT abdomen pelvis 11/25/2011 FINDINGS: Gallbladder: No gallstones or wall thickening visualized. No sonographic Murphy sign noted by sonographer. Common bile duct: Diameter: 2.4 mm Liver: Multiple hypoechoic masses are demonstrated within the liver. Within the left hepatic lobe there is a 1.6 x 1.2 x 0.9 cm hypoechoic mass. Within the right hepatic lobe there is a 1.2 x 1.0 x 1.1 cm hypoechoic mass. Increased hepatic parenchymal echogenicity. Portal vein is patent on color Doppler imaging with normal direction of blood flow towards the liver. Other: None. IMPRESSION: 1. No cholelithiasis or sonographic evidence for acute cholecystitis. 2. Multiple hypoechoic masses within the liver are indeterminate. Possibility of primary hepatic process or malignant process not excluded. Recommend dedicated evaluation with pre and post contrast-enhanced abdominal MRI. 3. Hepatic steatosis. 4. These results will be called to the ordering clinician or  representative by the Radiologist Assistant, and communication documented in the PACS or [employer]. Electronically Signed   By: Lovey Newcomer M.D.   On: 03/08/2022 10:17    Labs:  CBC: Recent Labs    03/14/22 1040  WBC 5.1  HGB 13.3  HCT 40.8  PLT 202    COAGS: No results for input(s): "INR", "APTT" in the last 8760 hours.  BMP: Recent Labs    03/14/22 1040  NA 139  K 3.8  CL 104  CO2 27  GLUCOSE 94  BUN 11  CALCIUM 10.0  CREATININE 0.77  GFRNONAA >60    LIVER FUNCTION TESTS: Recent Labs    03/14/22 1040  BILITOT 0.6  AST 21  ALT 18  ALKPHOS 76  PROT 7.8  ALBUMIN 4.6     Assessment and  Plan: Liver lesions in setting of pancreatic mass, mets suspected Plan for image guided liver lesion biopsy and port placement. Labs reviewed.  *Confirmed pt has NOT started Eliquis yet* Risks and benefits of image guided port-a-catheter placement was discussed with the patient including, but not limited to bleeding, infection, pneumothorax, or fibrin sheath development and need for additional procedures.  Risks and benefits of liver bx was discussed with the patient and/or patient's family including, but not limited to bleeding, infection, damage to adjacent structures or low yield requiring additional tests.  All of the questions were answered and there is agreement to proceed.  Consent signed and in chart.    Electronically Signed: Ascencion Dike, PA-C 03/22/2022, 12:00 PM   I spent a total of 30 minutes in face to face in clinical consultation, greater than 50% of which was counseling/coordinating care for liver bx and port

## 2022-03-22 NOTE — Discharge Instructions (Signed)
Please call Interventional Radiology clinic [phone] with any questions or concerns.  You may remove your dressing and shower tomorrow.  DO NOT use EMLA cream on your port site for 2 weeks as this cream will remove surgical glue on your incision.  Implanted Port Insertion, Care After This sheet gives you information about how to care for yourself after your procedure. Your health care provider may also give you more specific instructions. If you have problems or questions, contact your health care provider. What can I expect after the procedure? After the procedure, it is common to have: Discomfort at the port insertion site. Bruising on the skin over the port. This should improve over 3-4 days. Follow these instructions at home: [facility] care After your port is placed, you will get a manufacturer's information card. The card has information about your port. Keep this card with you at all times. Take care of the port as told by your health care provider. Ask your health care provider if you or a family member can get training for taking care of the port at home. A home health care nurse may also take care of the port. Make sure to remember what type of port you have. Incision care Follow instructions from your health care provider about how to take care of your port insertion site. Make sure you: Wash your hands with soap and water before and after you change your bandage (dressing). If soap and water are not available, use hand sanitizer. Change your dressing as told by your health care provider. Leave stitches (sutures), skin glue, or adhesive strips in place. These skin closures may need to stay in place for 2 weeks or longer. If adhesive strip edges start to loosen and curl up, you may trim the loose edges. Do not remove adhesive strips completely unless your health care provider tells you to do that. Check your port insertion site every day for signs of infection. Check for: Redness,  swelling, or pain. Fluid or blood. Warmth. Pus or a bad smell.        Activity Return to your normal activities as told by your health care provider. Ask your health care provider what activities are safe for you. Do not lift anything that is heavier than 10 lb (4.5 kg), or the limit that you are told, until your health care provider says that it is safe. General instructions Take over-the-counter and prescription medicines only as told by your health care provider. Do not take baths, swim, or use a hot tub until your health care provider approves. Ask your health care provider if you may take showers. You may only be allowed to take sponge baths. Do not drive for 24 hours if you were given a sedative during your procedure. Wear a medical alert bracelet in case of an emergency. This will tell any health care providers that you have a port. Keep all follow-up visits as told by your health care provider. This is important. Contact a health care provider if: You cannot flush your port with saline as directed, or you cannot draw blood from the port. You have a fever or chills. You have redness, swelling, or pain around your port insertion site. You have fluid or blood coming from your port insertion site. Your port insertion site feels warm to the touch. You have pus or a bad smell coming from the port insertion site. Get help right away if: You have chest pain or shortness of breath. You have bleeding from  your port that you cannot control. Summary Take care of the port as told by your health care provider. Keep the manufacturer's information card with you at all times. Change your dressing as told by your health care provider. Contact a health care provider if you have a fever or chills or if you have redness, swelling, or pain around your port insertion site. Keep all follow-up visits as told by your health care provider. This information is not intended to replace advice given to you  by your health care provider. Make sure you discuss any questions you have with your health care provider. Document Revised: 08/22/2017 Document Reviewed: 08/22/2017 Elsevier Patient Education  2021 [place].   Moderate Conscious Sedation, Adult, Care After This sheet gives you information about how to care for yourself after your procedure. Your health care provider may also give you more specific instructions. If you have problems or questions, contact your health care provider. What can I expect after the procedure? After the procedure, it is common to have: Sleepiness for several hours. Impaired judgment for several hours. Difficulty with balance. Vomiting if you eat too soon. Follow these instructions at home: For the time period you were told by your health care provider: Rest. Do not participate in activities where you could fall or become injured. Do not drive or use machinery. Do not drink alcohol. Do not take sleeping pills or medicines that cause drowsiness. Do not make important decisions or sign legal documents. Do not take care of children on your own.        Eating and drinking Follow the diet recommended by your health care provider. Drink enough fluid to keep your urine pale yellow. If you vomit: Drink water, juice, or soup when you can drink without vomiting. Make sure you have little or no nausea before eating solid foods.    General instructions Take over-the-counter and prescription medicines only as told by your health care provider. Have a responsible adult stay with you for the time you are told. It is important to have someone help care for you until you are awake and alert. Do not smoke. Keep all follow-up visits as told by your health care provider. This is important. Contact a health care provider if: You are still sleepy or having trouble with balance after 24 hours. You feel light-headed. You keep feeling nauseous or you keep vomiting. You  develop a rash. You have a fever. You have redness or swelling around the IV site. Get help right away if: You have trouble breathing. You have new-onset confusion at home. Summary After the procedure, it is common to feel sleepy, have impaired judgment, or feel nauseous if you eat too soon. Rest after you get home. Know the things you should not do after the procedure. Follow the diet recommended by your health care provider and drink enough fluid to keep your urine pale yellow. Get help right away if you have trouble breathing or new-onset confusion at home. This information is not intended to replace advice given to you by your health care provider. Make sure you discuss any questions you have with your health care provider. Document Revised: 05/24/2019 Document Reviewed: 12/20/2018 Elsevier Patient Education  2021 [employer].

## 2022-03-22 NOTE — Procedures (Signed)
Interventional Radiology Procedure Note  Procedure: Placement of a right IJ approach single lumen PowerPort.  Tip is positioned at the superior cavoatrial junction and catheter is ready for immediate use.  Complications: None Recommendations:  - Ok to shower tomorrow - Do not submerge for 7 days - Routine line care   Signed,  Jarious Lyon S. Alexiana Laverdure, DO   

## 2022-03-22 NOTE — Sedation Documentation (Signed)
Port finished

## 2022-03-22 NOTE — Procedures (Signed)
Interventional Radiology Procedure Note  Procedure: US guided biopsy of liver mass, left liver Complications: None EBL: None Specimen: mx 18g core Recommendations: - Bedrest 2 hours.   - Routine wound care - Follow up pathology - Advance diet   Signed,  Corrie Mckusick, DO

## 2022-03-22 NOTE — Discharge Instructions (Signed)
Liver Biopsy, Care After  May remove dressing or bandaid and shower tomorrow.  Keep site clean and dry. Replace with clean dressing or bandaid as necessary.   Urgent needs - Interventional Radiology clinic [phone]  After a liver biopsy, it is common to have these things in the area where the biopsy was done. You may: Have pain. Feel sore. Have bruising. You may also feel tired for a few days. Follow these instructions at home: Medicines Take over-the-counter and prescription medicines only as told by your doctor. If you were prescribed an antibiotic medicine, take it as told by your doctor. Do not stop taking the antibiotic, even if you start to feel better. Do not take medicines that may thin your blood. These medicines include aspirin and ibuprofen. Take them only if your doctor tells you to. If told, take steps to prevent problems with pooping (constipation). You may need to: Drink enough fluid to keep your pee (urine) pale yellow. Take medicines. You will be told what medicines to take. Eat foods that are high in fiber. These include beans, whole grains, and fresh fruits and vegetables. Limit foods that are high in fat and sugar. These include fried or sweet foods. Ask your doctor if you should avoid driving or using machines while you are taking your medicine. Caring for your incision Follow instructions from your doctor about how to take care of your cut from surgery (incisions). Make sure you: Wash your hands with soap and water for at least 20 seconds before and after you change your bandage. If you cannot use soap and water, use hand sanitizer. Change your bandage. Leavestitches or skin glue in place for at least two weeks. Leave tape strips alone unless you are told to take them off. You may trim the edges of the tape strips if they curl up. Check your incision every day for signs of infection. Check for: Redness, swelling, or more pain. Fluid or blood. Warmth. Pus or a  bad smell. Do not take baths, swim, or use a hot tub. Ask your doctor about taking showers or sponge baths. Activity Rest at home for 1-2 days, or as told by your doctor. Get up to take short walks every 1 to 2 hours. Ask for help if you feel weak or unsteady. Do not lift anything that is heavier than 10 lb (4.5 kg), or the limit that you are told. Do not play contact sports for 2 weeks after the procedure. Return to your normal activities as told by your doctor. Ask what activities are safe for you. General instructions Do not drink alcohol in the first week after the procedure. Plan to have a responsible adult care for you for the time you are told after you leave the hospital or clinic. This is important. It is up to you to get the results of your procedure. Ask how to get your results when they are ready. Keep all follow-up visits.   Contact a doctor if: You have more bleeding in your incision. Your incision swells, or is red and more painful. You have fluid that comes from your incision. You develop a rash. You have fever or chills. Get help right away if: You have swelling, bloating, or pain in your belly (abdomen). You get dizzy or faint. You vomit or you feel like vomiting. You have trouble breathing or feel short of breath. You have chest pain. You have problems talking or seeing. You have trouble with your balance or moving your arms or   legs. These symptoms may be an emergency. Get help right away. Call your local emergency services (911 in the [place] Do not wait to see if the symptoms will go away. Do not drive yourself to the hospital. Summary After the procedure, it is common to have pain, soreness, bruising, and tiredness. Your doctor will tell you how to take care of yourself at home. Change your bandage, take your medicines, and limit your activities as told by your doctor. Call your doctor if you have symptoms of infection. Get help right away if your belly swells,  your cut bleeds a lot, or you have trouble talking or breathing. This information is not intended to replace advice given to you by your health care provider. Make sure you discuss any questions you have with your healthcare provider. Document Revised: 12/09/2019 Document Reviewed: 12/09/2019 Elsevier Patient Education  2022 [employer]  Moderate Conscious Sedation, Adult, Care After This sheet gives you information about how to care for yourself after your procedure. Your health care provider may also give you more specific instructions. If you have problems or questions, contact your health careprovider. What can I expect after the procedure? After the procedure, it is common to have: Sleepiness for several hours. Impaired judgment for several hours. Difficulty with balance. Vomiting if you eat too soon. Follow these instructions at home: For the time period you were told by your health care provider: Rest. Do not participate in activities where you could fall or become injured. Do not drive or use machinery. Do not drink alcohol. Do not take sleeping pills or medicines that cause drowsiness. Do not make important decisions or sign legal documents. Do not take care of children on your own. Eating and drinking  Follow the diet recommended by your health care provider. Drink enough fluid to keep your urine pale yellow. If you vomit: Drink water, juice, or soup when you can drink without vomiting. Make sure you have little or no nausea before eating solid foods.  General instructions Take over-the-counter and prescription medicines only as told by your health care provider. Have a responsible adult stay with you for the time you are told. It is important to have someone help care for you until you are awake and alert. Do not smoke. Keep all follow-up visits as told by your health care provider. This is important. Contact a health care provider if: You are still sleepy or having  trouble with balance after 24 hours. You feel light-headed. You keep feeling nauseous or you keep vomiting. You develop a rash. You have a fever. You have redness or swelling around the IV site. Get help right away if: You have trouble breathing. You have new-onset confusion at home. Summary After the procedure, it is common to feel sleepy, have impaired judgment, or feel nauseous if you eat too soon. Rest after you get home. Know the things you should not do after the procedure. Follow the diet recommended by your health care provider and drink enough fluid to keep your urine pale yellow. Get help right away if you have trouble breathing or new-onset confusion at home. This information is not intended to replace advice given to you by your health care provider. Make sure you discuss any questions you have with your healthcare provider. Document Revised: 05/24/2019 Document Reviewed: 12/20/2018 Elsevier Patient Education  2022 [employer]        

## 2022-03-23 LAB — SURGICAL PATHOLOGY

## 2022-03-24 ENCOUNTER — Other Ambulatory Visit: Payer: Self-pay | Admitting: Physician Assistant

## 2022-03-24 ENCOUNTER — Other Ambulatory Visit: Payer: Self-pay | Admitting: Hematology and Oncology

## 2022-03-24 ENCOUNTER — Other Ambulatory Visit: Payer: Self-pay | Admitting: Genetic Counselor

## 2022-03-24 DIAGNOSIS — Z1379 Encounter for other screening for genetic and chromosomal anomalies: Secondary | ICD-10-CM

## 2022-03-24 DIAGNOSIS — C259 Malignant neoplasm of pancreas, unspecified: Secondary | ICD-10-CM

## 2022-03-24 NOTE — Progress Notes (Signed)
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 

## 2022-03-25 ENCOUNTER — Other Ambulatory Visit: Payer: Self-pay

## 2022-03-25 ENCOUNTER — Inpatient Hospital Stay (HOSPITAL_BASED_OUTPATIENT_CLINIC_OR_DEPARTMENT_OTHER): Payer: BC Managed Care – PPO | Admitting: Physician Assistant

## 2022-03-25 ENCOUNTER — Telehealth: Payer: Self-pay | Admitting: Physician Assistant

## 2022-03-25 ENCOUNTER — Inpatient Hospital Stay: Payer: BC Managed Care – PPO

## 2022-03-25 VITALS — BP 144/82 | HR 80 | Temp 97.9°F | Resp 18 | Ht 62.0 in | Wt 161.9 lb

## 2022-03-25 DIAGNOSIS — R1013 Epigastric pain: Secondary | ICD-10-CM

## 2022-03-25 DIAGNOSIS — C259 Malignant neoplasm of pancreas, unspecified: Secondary | ICD-10-CM | POA: Diagnosis not present

## 2022-03-25 DIAGNOSIS — R630 Anorexia: Secondary | ICD-10-CM | POA: Diagnosis not present

## 2022-03-25 DIAGNOSIS — Z803 Family history of malignant neoplasm of breast: Secondary | ICD-10-CM | POA: Diagnosis not present

## 2022-03-25 DIAGNOSIS — Z7901 Long term (current) use of anticoagulants: Secondary | ICD-10-CM | POA: Diagnosis not present

## 2022-03-25 DIAGNOSIS — C250 Malignant neoplasm of head of pancreas: Secondary | ICD-10-CM | POA: Diagnosis not present

## 2022-03-25 DIAGNOSIS — Z452 Encounter for adjustment and management of vascular access device: Secondary | ICD-10-CM | POA: Diagnosis not present

## 2022-03-25 DIAGNOSIS — I81 Portal vein thrombosis: Secondary | ICD-10-CM | POA: Diagnosis not present

## 2022-03-25 DIAGNOSIS — C787 Secondary malignant neoplasm of liver and intrahepatic bile duct: Secondary | ICD-10-CM | POA: Diagnosis not present

## 2022-03-25 DIAGNOSIS — Z5111 Encounter for antineoplastic chemotherapy: Secondary | ICD-10-CM | POA: Diagnosis not present

## 2022-03-25 DIAGNOSIS — R634 Abnormal weight loss: Secondary | ICD-10-CM | POA: Diagnosis not present

## 2022-03-25 DIAGNOSIS — Z7189 Other specified counseling: Secondary | ICD-10-CM | POA: Diagnosis not present

## 2022-03-25 DIAGNOSIS — Z1379 Encounter for other screening for genetic and chromosomal anomalies: Secondary | ICD-10-CM

## 2022-03-25 DIAGNOSIS — K5900 Constipation, unspecified: Secondary | ICD-10-CM | POA: Diagnosis not present

## 2022-03-25 LAB — CBC WITH DIFFERENTIAL (CANCER CENTER ONLY)
Abs Immature Granulocytes: 0.01 10*3/uL (ref 0.00–0.07)
Basophils Absolute: 0 10*3/uL (ref 0.0–0.1)
Basophils Relative: 1 %
Eosinophils Absolute: 0.1 10*3/uL (ref 0.0–0.5)
Eosinophils Relative: 2 %
HCT: 39.4 % (ref 36.0–46.0)
Hemoglobin: 13.1 g/dL (ref 12.0–15.0)
Immature Granulocytes: 0 %
Lymphocytes Relative: 29 %
Lymphs Abs: 1.7 10*3/uL (ref 0.7–4.0)
MCH: 28.4 pg (ref 26.0–34.0)
MCHC: 33.2 g/dL (ref 30.0–36.0)
MCV: 85.5 fL (ref 80.0–100.0)
Monocytes Absolute: 0.5 10*3/uL (ref 0.1–1.0)
Monocytes Relative: 8 %
Neutro Abs: 3.6 10*3/uL (ref 1.7–7.7)
Neutrophils Relative %: 60 %
Platelet Count: 196 10*3/uL (ref 150–400)
RBC: 4.61 MIL/uL (ref 3.87–5.11)
RDW: 13.2 % (ref 11.5–15.5)
WBC Count: 5.9 10*3/uL (ref 4.0–10.5)
nRBC: 0 % (ref 0.0–0.2)

## 2022-03-25 LAB — CMP (CANCER CENTER ONLY)
ALT: 28 U/L (ref 0–44)
AST: 35 U/L (ref 15–41)
Albumin: 4.6 g/dL (ref 3.5–5.0)
Alkaline Phosphatase: 76 U/L (ref 38–126)
Anion gap: 6 (ref 5–15)
BUN: 9 mg/dL (ref 6–20)
CO2: 31 mmol/L (ref 22–32)
Calcium: 10.2 mg/dL (ref 8.9–10.3)
Chloride: 103 mmol/L (ref 98–111)
Creatinine: 0.72 mg/dL (ref 0.44–1.00)
GFR, Estimated: >60 mL/min (ref >60)
Glucose, Bld: 110 mg/dL — ABNORMAL HIGH (ref 70–99)
Potassium: 3.9 mmol/L (ref 3.5–5.1)
Sodium: 140 mmol/L (ref 135–145)
Total Bilirubin: 0.5 mg/dL (ref 0.3–1.2)
Total Protein: 7.9 g/dL (ref 6.5–8.1)

## 2022-03-25 LAB — GENETIC SCREENING ORDER

## 2022-03-25 MED ORDER — HYDROCODONE-ACETAMINOPHEN 5-325 MG PO TABS: 1.0000 | ORAL_TABLET | Freq: Four times a day (QID) | ORAL | 0 refills | Status: DC | PRN

## 2022-03-25 NOTE — Progress Notes (Signed)
Foundation One testing requested via [facility] online portal.

## 2022-03-25 NOTE — Telephone Encounter (Signed)
Called patient to confirm new appointments. Left voicemail with new appointment information. Will f/u before EOD.

## 2022-03-25 NOTE — Progress Notes (Signed)
[place] Telephone:([phone]) [phone]   [phone]) [phone]  Progress Note:  Patient Care Team: Robyne Peers, MD as PCP - General (Family Medicine) Gordy Levan, MD as Consulting Physician (Medical Oncology)  CHIEF COMPLAINTS/PURPOSE OF CONSULTATION:  Metastatic pancreatic adenocarcinoma involving the liver  ONCOLOGIC HISTORY: Presented with nausea and right upper quadrant pain 03/08/2022: Abdominal US showed multiple hypoechoic masses within the liver are indeterminate.  03/10/2022: MR abdomen: Hypoenhancing mass compatible with pancreatic head adenocarcinoma with numerous targetoid enhancing masses in all segments of the liver, compatible with metastatic disease. The mass severely effaces and nearly occludes the portal vein and splenic vein, and occludes the superior mesenteric vein with some collaterals noted. The mass substantially abuts the proximal transverse duodenum, although overt duodenal invasion is indeterminate. 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 Prominent stool throughout the colon favors constipation. Fluid-fluid level in the gallbladder likely from sludge. 03/14/2022: Establish care with [facility]/On[facility] 03/17/2022: CT chest: no definitive evidence of lung metastases.  03/22/2022: Liver biopsy confirmed metastatic adenocarcinoma, pancreatic primary.   HISTORY OF PRESENTING ILLNESS:  Kaitlin Ferrell returns for follow-up to finalize treatment recommendations for newly diagnosed pancreatic cancer.  She is accompanied by her husband for this visit.  Ms. [name] reports her energy  levels are overall stable.  She does have some fatigue but continues to complete all her daily activities on her own.  She still struggles with epigastric and right upper quadrant pain.  She has tried tramadol with minimal relief.  She reports the pain does radiate to her back.  Patient denies any nausea or vomiting.  She continues to struggle with constipation with a bowel movement twice a week she adds that she is not taking her stool softeners and laxatives consistently.  She denies easy bruising or signs of active bleeding.  Her weight is stable since the last visit.She denies fevers, chills, sweats, shortness of breath, chest pain or cough. She has no other complaints. Rest of the 10 point ROS is below.  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   treated with 4 cycles of actinomycin D from 12-02-11 thru 01-13-12   H/O molar pregnancy, antepartum    Hypertension    Migraine    Miscarriage     SURGICAL HISTORY: Past Surgical History:  Procedure Laterality Date   BREAST BIOPSY  2001   Left   BREAST LUMPECTOMY     DILATION AND CURETTAGE OF UTERUS  1992, 09/2011   DILATION AND EVACUATION  2011   INSERTION OF MESH N/A 09/03/2013   Procedure: INSERTION OF MESH;  Surgeon: Adin Hector, MD;  Location: [facility];  Service: General;  Laterality: N/A;   IR IMAGING GUIDED PORT INSERTION  03/22/2022   IR US LIVER BIOPSY  03/22/2022   PORTACATH PLACEMENT  11/2011   PORTACATH PLACEMENT     REMOVAL  MARCH 2014   VENTRAL HERNIA REPAIR N/A 09/03/2013   Procedure: LAPAROSCOPIC VENTRAL WALL HERNIA REPAIR;  Surgeon: Adin Hector, MD;  Location: [facility];  Service:  General;  Laterality: N/A;   WISDOM TOOTH EXTRACTION     in 11th grade    SOCIAL HISTORY: Social History   Socioeconomic History   Marital status: Married    Spouse name: [name]    Number of children: 2   Years of education: college   Highest education level: Not on file  Occupational History    Comment: [employer]  Tobacco Use   Smoking status: Never   Smokeless tobacco: Never  Substance and Sexual Activity   Alcohol use: Yes    Alcohol/week: 1.0 standard drink of alcohol    Types: 1 Glasses of wine per week    Comment: occas   Drug use: No   Sexual activity: Not Currently    Birth control/protection: Pill  Other Topics Concern   Not on file  Social History Narrative   Patient is married [name]). Patient works part time at [employer].   Right handed.   Patient has her CMA.   Caffeine- None         Social Determinants of Health   Financial Resource Strain: Not on file  Food Insecurity: No Food Insecurity (03/14/2022)   Hunger Vital Sign    Worried About Running Out of Food in the Last Year: Never true    Ran Out of Food in the Last Year: Never true  Transportation Needs: No Transportation Needs (03/14/2022)   PRAPARE - Hydrologist (Medical): No    Lack of Transportation (Non-Medical): No  Physical Activity: Not on file  Stress: Not on file  Social Connections: Not on file  Intimate Partner Violence: Not At Risk (03/14/2022)   Humiliation, Afraid, Rape, and Kick questionnaire    Fear of Current or Ex-Partner: No    Emotionally Abused: No    Physically Abused: No    Sexually Abused: No    FAMILY HISTORY: Family History  Problem Relation Age of Onset   Breast cancer Mother    Heart attack Mother    Diabetes Father    Breast cancer Maternal Aunt    Breast cancer Maternal Grandmother    Stroke Paternal Grandmother    Breast cancer Maternal Aunt    Alcoholism Other     ALLERGIES:  is allergic to compazine [prochlorperazine edisylate], propofol, oxycodone-acetaminophen, prochlorperazine, and labetalol.  MEDICATIONS:  Current Outpatient Medications  Medication Sig Dispense Refill   acetaminophen (TYLENOL) 325  MG tablet Take 650 mg by mouth every 6 (six) hours as needed for headache (if relpax does not help).     amLODipine (NORVASC) 10 MG tablet Take 1 tablet by mouth daily.     APIXABAN (ELIQUIS) VTE STARTER PACK ('10MG'$  AND '5MG'$ ) Take as directed on package: start with two-'5mg'$  tablets twice daily for 7 days. On day 8, switch to one-'5mg'$  tablet twice daily. Start after liver biopsy and port placement. 1 each 0   eletriptan (RELPAX) 40 MG tablet Take 1 tablet (40 mg total) by mouth as needed for migraine or headache. May repeat in 2 hours if needed. 15 tablet 6   HYDROcodone-acetaminophen (NORCO) 5-325 MG tablet Take 1 tablet by mouth every 6 (six) hours as needed for moderate pain. 30 tablet 0   ibuprofen (ADVIL,MOTRIN) 200 MG tablet Take 400 mg by mouth every 6 (six) hours as needed.     lidocaine-prilocaine (EMLA) cream Apply 1 Application topically as needed. 30 g 0   magnesium oxide (MAG-OX) 400 (241.3 MG) MG tablet TAKE 1 TABLET BY MOUTH  TWICE A DAY 60 tablet 6   ondansetron (ZOFRAN) 8 MG tablet Take 1 tablet (8 mg total) by mouth every 8 (eight) hours as needed for nausea or vomiting. 90 tablet 0   Riboflavin (B2) 100 MG TABS Take 1 tablet by mouth 2 (two) times daily.  12   No current facility-administered medications for this visit.    REVIEW OF SYSTEMS:   Constitutional: ( - ) fevers, ( - )  chills , ( - ) night sweats Eyes: ( - ) blurriness of vision, ( - ) double vision, ( - ) watery eyes Ears, nose, mouth, throat, and face: ( - ) mucositis, ( - ) sore throat Respiratory: ( - ) cough, ( - ) dyspnea, ( - ) wheezes Cardiovascular: ( - ) palpitation, ( - ) chest discomfort, ( - ) lower extremity swelling Gastrointestinal:  (-) nausea, ( - ) heartburn, ( +) change in bowel habits Skin: ( - ) abnormal skin rashes Lymphatics: ( - ) new lymphadenopathy, ( - ) easy bruising Neurological: ( - ) numbness, ( - ) tingling, ( - ) new weaknesses Behavioral/Psych: ( - ) mood change, ( - ) new changes   All other systems were reviewed with the patient and are negative.  PHYSICAL EXAMINATION: ECOG PERFORMANCE STATUS: 1 - Symptomatic but completely ambulatory  Vitals:   03/25/22 0843  BP: (!) 144/82  Pulse: 80  Resp: 18  Temp: 97.9 F (36.6 C)  SpO2: 99%   Filed Weights   03/25/22 0843  Weight: 161 lb 14.4 oz (73.4 kg)    GENERAL: well appearing female in NAD  SKIN: skin color, texture, turgor are normal, no rashes or significant lesions EYES: conjunctiva are pink and non-injected, sclera clear LUNGS: clear to auscultation and percussion with normal breathing effort HEART: regular rate & rhythm and no murmurs and no lower extremity edema ABDOMEN:  normal bowel sounds. Abdomen is tender to palpation in epigastric and RUQ region. No palpable masses or hepatomegaly appreciated.  Musculoskeletal: no cyanosis of digits and no clubbing  PSYCH: alert & oriented x 3, fluent speech NEURO: no focal motor/sensory deficits  LABORATORY DATA:  I have reviewed the data as listed    Latest Ref Rng & Units 03/22/2022   12:30 PM 03/14/2022   10:40 AM 08/23/2013   11:05 AM  CBC  WBC 4.0 - 10.5 K/uL 4.9  5.1  6.6   Hemoglobin 12.0 - 15.0 g/dL 12.8  13.3  12.7   Hematocrit 36.0 - 46.0 % 40.7  40.8  39.3   Platelets 150 - 400 K/uL 155  202  217        Latest Ref Rng & Units 03/22/2022   12:30 PM 03/14/2022   10:40 AM 02/21/2012   12:05 PM  CMP  Glucose 70 - 99 mg/dL 88  94  70   BUN 6 - 20 mg/dL 11  11  11.0   Creatinine 0.44 - 1.00 mg/dL 0.75  0.77  0.9   Sodium 135 - 145 mmol/L 138  139  140   Potassium 3.5 - 5.1 mmol/L 3.7  3.8  3.4   Chloride 98 - 111 mmol/L 104  104  111   CO2 22 - 32 mmol/L '24  27  20   '$ Calcium 8.9 - 10.3 mg/dL 9.3  10.0  9.2   Total Protein 6.5 - 8.1 g/dL 8.3  7.8  7.6   Total Bilirubin 0.3 - 1.2 mg/dL 0.7  0.6  0.45  Alkaline Phos 38 - 126 U/L 64  76  40   AST 15 - 41 U/L 32  21  13   ALT 0 - 44 U/L 24  18  <6 Repeated and Verified      RADIOGRAPHIC  STUDIES: I have personally reviewed the radiological images as listed and agreed with the findings in the report. IR IMAGING GUIDED PORT INSERTION  Result Date: 03/22/2022 INDICATION: 57 year old female referred for port catheter and liver mass biopsy, with suspected pancreatic metastases EXAM: IMAGE GUIDED PORT CATHETER IMAGE GUIDED LIVER MASS BIOPSY MEDICATIONS: None ANESTHESIA/SEDATION: Moderate (conscious) sedation was employed during this procedure. A total of Versed 3.0 mg and Fentanyl 100 mcg was administered intravenously. Moderate Sedation Time: 30 minutes. The patient's level of consciousness and vital signs were monitored continuously by radiology nursing throughout the procedure under my direct supervision. FLUOROSCOPY TIME:  Fluoroscopy Time:  (0 mGy). COMPLICATIONS: None PROCEDURE: Informed written consent was obtained from the patient after a thorough discussion of the procedural risks, benefits and alternatives. All questions were addressed. Maximal Sterile Barrier Technique was utilized including caps, mask, sterile gowns, sterile gloves, sterile drape, hand hygiene and skin antiseptic. A timeout was performed prior to the initiation of the procedure. Ultrasound survey was performed with images stored and sent to PACs. Right IJ vein documented to be patent. The right neck and chest was prepped with chlorhexidine, and draped in the usual sterile fashion using maximum barrier technique (cap and mask, sterile gown, sterile gloves, large sterile sheet, hand hygiene and cutaneous antiseptic). Local anesthesia was attained by infiltration with 1% lidocaine without epinephrine. Ultrasound demonstrated patency of the right internal jugular vein, and this was documented with an image. Under real-time ultrasound guidance, this vein was accessed with a 21 gauge micropuncture needle and image documentation was performed. A small dermatotomy was made at the access site with an 11 scalpel. A 0.018" wire was  advanced into the SVC and used to estimate the length of the internal catheter. The access needle exchanged for a 9F micropuncture vascular sheath. The 0.018" wire was then removed and a 0.035" wire advanced into the IVC. An appropriate location for the subcutaneous reservoir was selected below the clavicle and an incision was made through the skin and underlying soft tissues. The subcutaneous tissues were then dissected using a combination of blunt and sharp surgical technique and a pocket was formed. A single lumen power injectable portacatheter was then tunneled through the subcutaneous tissues from the pocket to the dermatotomy and the port reservoir placed within the subcutaneous pocket. Given that the port catheter was placed in a region near by a prior port catheter location, anchor suture was performed. The venous access site was then serially dilated and a peel away vascular sheath placed over the wire. The wire was removed and the port catheter advanced into position under fluoroscopic guidance. The catheter tip is positioned in the cavoatrial junction. This was documented with a spot image. The portacatheter was then tested and found to flush and aspirate well. The port was flushed with saline followed by 100 units/mL heparinized saline. The pocket was then closed in two layers using first subdermal inverted interrupted absorbable sutures followed by a running subcuticular suture. The epidermis was then sealed with Dermabond. The dermatotomy at the venous access site was also seal with Dermabond. We then proceeded with image guided liver mass biopsy. The right lower thorax/right upper abdomen was prepped with chlorhexidine in a sterile fashion, and a sterile drape was  applied covering the operative field. A sterile gown and sterile gloves were used for the procedure. Local anesthesia was provided with 1% Lidocaine. Ultrasound survey of the right liver lobe performed with images stored and sent to PACs.  The patient was prepped and draped sterilely and the skin and subcutaneous tissues were generously infiltrated with 1% lidocaine. A 17 gauge introducer needle was then advanced under ultrasound guidance in a subcostal location into the left liver lobe. The stylet was removed, and multiple separate 18 gauge core biopsy were retrieved. Samples were placed into formalin for transportation to the lab. Gel-Foam pledgets were then infused with a small amount of saline for assistance with hemostasis. The needle was removed, and a final ultrasound image was performed. The patient tolerated the procedure well and remained hemodynamically stable throughout. No complications were encountered and no significant blood loss was encounter. IMPRESSION: Status post image guided right IJ port catheter placement, and ultrasound-guided liver mass biopsy. Signed, Dulcy Fanny. Nadene Rubins, RPVI [facility] Radiology Electronically Signed   By: Corrie Mckusick D.O.   On: 03/22/2022 14:41   IR US LIVER BIOPSY  Result Date: 03/22/2022 INDICATION: 57 year old female referred for port catheter and liver mass biopsy, with suspected pancreatic metastases EXAM: IMAGE GUIDED PORT CATHETER IMAGE GUIDED LIVER MASS BIOPSY MEDICATIONS: None ANESTHESIA/SEDATION: Moderate (conscious) sedation was employed during this procedure. A total of Versed 3.0 mg and Fentanyl 100 mcg was administered intravenously. Moderate Sedation Time: 30 minutes. The patient's level of consciousness and vital signs were monitored continuously by radiology nursing throughout the procedure under my direct supervision. FLUOROSCOPY TIME:  Fluoroscopy Time:  (0 mGy). COMPLICATIONS: None PROCEDURE: Informed written consent was obtained from the patient after a thorough discussion of the procedural risks, benefits and alternatives. All questions were addressed. Maximal Sterile Barrier Technique was utilized including caps, mask,  sterile gowns, sterile gloves, sterile drape, hand hygiene and skin antiseptic. A timeout was performed prior to the initiation of the procedure. Ultrasound survey was performed with images stored and sent to PACs. Right IJ vein documented to be patent. The right neck and chest was prepped with chlorhexidine, and draped in the usual sterile fashion using maximum barrier technique (cap and mask, sterile gown, sterile gloves, large sterile sheet, hand hygiene and cutaneous antiseptic). Local anesthesia was attained by infiltration with 1% lidocaine without epinephrine. Ultrasound demonstrated patency of the right internal jugular vein, and this was documented with an image. Under real-time ultrasound guidance, this vein was accessed with a 21 gauge micropuncture needle and image documentation was performed. A small dermatotomy was made at the access site with an 11 scalpel. A 0.018" wire was advanced into the SVC and used to estimate the length of the internal catheter. The access needle exchanged for a 7F micropuncture vascular sheath. The 0.018" wire was then removed and a 0.035" wire advanced into the IVC. An appropriate location for the subcutaneous reservoir was selected below the clavicle and an incision was made through the skin and underlying soft tissues. The subcutaneous tissues were then dissected using a combination of blunt and sharp surgical technique and a pocket was formed. A single lumen power injectable portacatheter was then tunneled through the subcutaneous tissues from the pocket to the dermatotomy and the port reservoir placed within the subcutaneous pocket. Given that the port catheter was placed in a region near by a prior port catheter location, anchor suture was performed. The venous access site was then serially dilated and a  peel away vascular sheath placed over the wire. The wire was removed and the port catheter advanced into position under fluoroscopic guidance. The catheter tip is  positioned in the cavoatrial junction. This was documented with a spot image. The portacatheter was then tested and found to flush and aspirate well. The port was flushed with saline followed by 100 units/mL heparinized saline. The pocket was then closed in two layers using first subdermal inverted interrupted absorbable sutures followed by a running subcuticular suture. The epidermis was then sealed with Dermabond. The dermatotomy at the venous access site was also seal with Dermabond. We then proceeded with image guided liver mass biopsy. The right lower thorax/right upper abdomen was prepped with chlorhexidine in a sterile fashion, and a sterile drape was applied covering the operative field. A sterile gown and sterile gloves were used for the procedure. Local anesthesia was provided with 1% Lidocaine. Ultrasound survey of the right liver lobe performed with images stored and sent to PACs. The patient was prepped and draped sterilely and the skin and subcutaneous tissues were generously infiltrated with 1% lidocaine. A 17 gauge introducer needle was then advanced under ultrasound guidance in a subcostal location into the left liver lobe. The stylet was removed, and multiple separate 18 gauge core biopsy were retrieved. Samples were placed into formalin for transportation to the lab. Gel-Foam pledgets were then infused with a small amount of saline for assistance with hemostasis. The needle was removed, and a final ultrasound image was performed. The patient tolerated the procedure well and remained hemodynamically stable throughout. No complications were encountered and no significant blood loss was encounter. IMPRESSION: Status post image guided right IJ port catheter placement, and ultrasound-guided liver mass biopsy. Signed, Dulcy Fanny. Nadene Rubins, RPVI [facility]iology [facility] Radiology Electronically Signed   By: Corrie Mckusick D.O.   On: 03/22/2022 14:41   CT Chest W  Contrast  Result Date: 03/18/2022 CLINICAL DATA:  57 year old female with history of pancreatic cancer. Staging examination. * Tracking Code: BO * EXAM: CT CHEST WITH CONTRAST TECHNIQUE: Multidetector CT imaging of the chest was performed during intravenous contrast administration. RADIATION DOSE REDUCTION: This exam was performed according to the departmental dose-optimization program which includes automated exposure control, adjustment of the mA and/or kV according to patient size and/or use of iterative reconstruction technique. CONTRAST:  33m OMNIPAQUE IOHEXOL 300 MG/ML  SOLN COMPARISON:  None Available. FINDINGS: Cardiovascular: Heart size is normal. There is no significant pericardial fluid, thickening or pericardial calcification. Aortic atherosclerosis. No definite coronary artery calcifications. Mediastinum/Nodes: No pathologically enlarged mediastinal or hilar lymph nodes. Esophagus is unremarkable in appearance. No axillary lymphadenopathy. Lungs/Pleura: 3 mm left lower lobe pulmonary nodule (axial image 93 of series 5), nonspecific. No other larger more suspicious appearing pulmonary nodules or masses are noted. No acute consolidative airspace disease. No pleural effusions. Upper Abdomen: Numerous hypovascular hepatic lesions scattered throughout the visualized liver better demonstrated on recent abdominal MRI 03/10/2022, compatible with metastatic disease, with the largest of these in segment 4A on today's examination measuring at least 2.4 x 2.4 cm, similar to the recent prior study. Musculoskeletal: There are no aggressive appearing lytic or blastic lesions noted in the visualized portions of the skeleton. IMPRESSION: 1. No definitive findings to suggest metastatic disease to the thorax. 2. 3 mm left lower lobe pulmonary nodule, nonspecific, but statistically likely benign. Continued attention on routine follow-up imaging is recommended to exclude the possibility of a metastatic lesion. 3. Aortic  atherosclerosis. 4.  Widespread metastatic disease to the liver better demonstrated on recent abdominal MRI 03/10/2022, please see that report for full description. Aortic Atherosclerosis (ICD10-I70.0). Electronically Signed   By: Vinnie Langton M.D.   On: 03/18/2022 08:50   MR ABDOMEN WWO CONTRAST  Result Date: 03/10/2022 CLINICAL DATA:  Hepatic lesions on sonography for further characterization. Abdominal pain extending to the flanks, dietary alterations have not relieve the patient's symptoms. EXAM: MRI ABDOMEN WITHOUT AND WITH CONTRAST TECHNIQUE: Multiplanar multisequence MR imaging of the abdomen was performed both before and after the administration of intravenous contrast. CONTRAST:  50m GADAVIST GADOBUTROL 1 MMOL/ML IV SOLN COMPARISON:  Abdominal ultrasound 03/08/2022 FINDINGS: Lower chest: Unremarkable Hepatobiliary: Numerous targetoid enhancing masses are scattered in all segments of the liver. Many of these have central hypoenhancing components indicating central necrosis. Index segment 4a lesion 2.3 by 2.5 cm on image 32 series 801. No thrombosis of and intrahepatic portal vein although the portal vein is severely narrowed and either occluded or nearly occluded by the primary pancreatic head mass. No biliary dilatation. Fluid-fluid level in the gallbladder likely from sludge. Pancreas: Hypoenhancing mass within the inferior portion of the pancreatic head, high suspicion for pancreatic adenocarcinoma, with following characteristics: Size: 4.8 by 2.9 by 2.2 cm (image 81, series 801) Location: Head and uncinate process Characterization: Solid Enhancement: Hypoenhancement particularly on arterial phase images, ill-defined infiltrative margins Other Characteristics: N/A Local extent of mass: The mass substantially abuts the proximal transverse duodenum, although overt duodenal invasion is indeterminate. Vascular Involvement: The mass severely effaces and nearly occludes the portal vein, with substantial  collateral vessels along the upper anterior omentum. The splenic vein is severely narrowed and nearly occluded. The SMV is occluded. The mass substantially extends around the superior mesenteric artery as on image 78 series 804. Mass effect from the lesion flattens the IVC locally as on image 80 of series 804. Variant hepatic artery anatomy: Present, the celiac trunk appears to supply the left hepatic lobe but a branch from the SMA provides systemic arterial supply to the right hepatic lobe. Bile Duct Involvement: The common bile duct extends through the region of the mass and is severely effaced in this vicinity, but there is no current biliary dilatation. No dorsal pancreatic duct dilatation Variant biliary anatomy: Not observed Adjacent Nodes: Left periaortic Omental/Peritoneal Disease: Absent Distant Metastases: Liver Mild stranding in the mesentery around the pancreatic head. Spleen:  Unremarkable Adrenals/Urinary Tract:  Unremarkable Stomach/Bowel: Prominent stool throughout the colon favors constipation. Vascular/Lymphatic: Vascular involvement related to the pancreatic mass is as discussed above. Stranding in the retroperitoneum surrounding clustered mildly enlarged left periaortic lymph nodes, measuring up to about 1.2 cm on image 27 series 4. Questionable filling defect proximally in the left ovarian vein, cannot exclude a small amount of left ovarian vein thrombosis for example on image 79 series 803. Other:  No supplemental non-categorized findings. Musculoskeletal: Unremarkable IMPRESSION: 1. 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 The mass severely effaces and nearly occludes the portal vein and splenic vein, and occludes the superior mesenteric vein with some collaterals noted. The mass substantially abuts the proximal transverse duodenum, although overt duodenal  invasion is indeterminate. The mass also extends around the superior mesenteric artery and encases the distal CBD although currently no biliary dilatation is observed. 2. Potential partial thrombosis of the proximal left ovarian vein. 3. Variant hepatic artery anatomy is present, the celiac trunk appears to supply the  left hepatic lobe but a branch from the SMA provides systemic arterial supply to the right hepatic lobe. 4. Prominent stool throughout the colon favors constipation. 5. Fluid-fluid level in the gallbladder likely from sludge. These results were called by telephone at the time of interpretation on 03/10/2022 at 1:10 pm to provider Dr. Ronnette Juniper , who verbally acknowledged these results. Electronically Signed   By: Van Clines M.D.   On: 03/10/2022 13:27   US Abdomen Limited RUQ (LIVER/GB)  Result Date: 03/08/2022 CLINICAL DATA:  Right upper quadrant pain.  Nausea. EXAM: ULTRASOUND ABDOMEN LIMITED RIGHT UPPER QUADRANT COMPARISON:  CT abdomen pelvis 11/25/2011 FINDINGS: Gallbladder: No gallstones or wall thickening visualized. No sonographic Murphy sign noted by sonographer. Common bile duct: Diameter: 2.4 mm Liver: Multiple hypoechoic masses are demonstrated within the liver. Within the left hepatic lobe there is a 1.6 x 1.2 x 0.9 cm hypoechoic mass. Within the right hepatic lobe there is a 1.2 x 1.0 x 1.1 cm hypoechoic mass. Increased hepatic parenchymal echogenicity. Portal vein is patent on color Doppler imaging with normal direction of blood flow towards the liver. Other: None. IMPRESSION: 1. No cholelithiasis or sonographic evidence for acute cholecystitis. 2. Multiple hypoechoic masses within the liver are indeterminate. Possibility of primary hepatic process or malignant process not excluded. Recommend dedicated evaluation with pre and post contrast-enhanced abdominal MRI. 3. Hepatic steatosis. 4. These results will be called to the ordering clinician or representative by the Radiologist  Assistant, and communication documented in the PACS or [employer]. Electronically Signed   By: Lovey Newcomer M.D.   On: 03/08/2022 10:17    ASSESSMENT & PLAN Mythili Kneisel is a 57 y.o. female who returns to the clinic for newly diagnosed pancreatic adenocarcinoma.     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 Dr. Lorenso Courier recommends chemotherapy regimen with FOLFIRINOX q 2 weeks.  --Plan to obtain restaging CT scans every 3 months to assess treatment response.  --Sent tumor for NGS testing.  --Tentative start of Cycle 1, Day 1 of FOLFIRINX on 03/30/2022.   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 Sent prescription for Norco 5-325 mg q 4-6 hours PRN  #Constipation: --Advised to be aggressive with bowel regimen including miralax and take senokot daily  #Appetite loss/weight loss: --Advised to supplement with protein shakes. --Sent referral to [facility] nutrition team  #Family history of breast cancer: --Due to strong family history of cancer and has undergone genetic testing in the past. With the anticipated diagnosis of pancreatic cancer, we will make a referral to genetics to see if additional testing is required.   #Supportive Care -- chemotherapy education to be scheduled  -- port placement complete  -- zofran '8mg'$  q8H PRN for nausea -- EMLA cream for port   Orders Placed This Encounter  Procedures   CBC with Differential ([place] Only)    Standing Status:   Future    Standing Expiration Date:   03/31/2023   CMP ([place] only)     Standing Status:   Future    Standing Expiration Date:   03/31/2023    All questions were answered. The patient knows to call the clinic with any problems, questions or  concerns.  I have spent a total of 45 minutes minutes of face-to-face and non-face-to-face time, preparing to see the patient,  performing a medically appropriate examination, counseling and educating the patient, ordering medications, documenting clinical information in the electronic health record,  and care coordination.   Dede Query, PA-C Department of Hematology/Oncology [place] at [facility] Phone: [phone]  Patient was seen with Dr. Lorenso Courier.  I have read the above note and personally examined the patient. I agree with the assessment and plan as noted above.  Briefly Mrs. Odoherty is a 57 year old female with newly diagnosed metastatic adenocarcinoma of the pancreas.  Today we discussed the treatment plan of FOLFIRINOX.  She voiced her understanding of the risks, benefits, and expected adverse effects of the FOLFIRINOX regimen.  We also discussed the incurable nature of this disease and the likely prognosis.  The patient voiced her understanding of our plan was willing and able to proceed with this regimen.  Will plan to have the patient return to clinic on 03/30/2022 in order to start.   Ledell Peoples, MD Department of Hematology/Oncology [place] at [facility] Phone: [phone] Pager: [phone] Email: [email or web address]

## 2022-03-27 ENCOUNTER — Other Ambulatory Visit: Payer: Self-pay

## 2022-03-29 ENCOUNTER — Encounter: Payer: Self-pay | Admitting: Hematology and Oncology

## 2022-03-29 ENCOUNTER — Other Ambulatory Visit: Payer: Self-pay

## 2022-03-29 ENCOUNTER — Inpatient Hospital Stay: Payer: BC Managed Care – PPO

## 2022-03-29 ENCOUNTER — Inpatient Hospital Stay (HOSPITAL_BASED_OUTPATIENT_CLINIC_OR_DEPARTMENT_OTHER): Payer: BC Managed Care – PPO | Admitting: Genetic Counselor

## 2022-03-29 ENCOUNTER — Encounter: Payer: Self-pay | Admitting: Genetic Counselor

## 2022-03-29 ENCOUNTER — Other Ambulatory Visit: Payer: Self-pay | Admitting: Physician Assistant

## 2022-03-29 DIAGNOSIS — C259 Malignant neoplasm of pancreas, unspecified: Secondary | ICD-10-CM

## 2022-03-29 DIAGNOSIS — Z803 Family history of malignant neoplasm of breast: Secondary | ICD-10-CM | POA: Insufficient documentation

## 2022-03-29 MED ORDER — OLANZAPINE 10 MG PO TABS: 10.0000 mg | ORAL_TABLET | Freq: Every day | ORAL | 0 refills | Status: DC

## 2022-03-29 NOTE — Progress Notes (Signed)
REFERRING PROVIDER: Cordelia Poche [place],  [place] [address]  PRIMARY PROVIDER:  Robyne Peers, MD  PRIMARY REASON FOR VISIT:  1. Family history of breast cancer   2. Pancreatic adenocarcinoma ([place])      HISTORY OF PRESENT ILLNESS:   Kaitlin Ferrell, a 57 y.o. female, was seen for a [place] cancer genetics consultation at the request of Dede Query due to a personal and family history of cancer.  Kaitlin Ferrell presents to clinic today to discuss the possibility of a hereditary predisposition to cancer, genetic testing, and to further clarify her future cancer risks, as well as potential cancer risks for family members.   In February 2024, at the age of 43, Kaitlin Ferrell was diagnosed with pancreatic adenocarcinoma.  The treatment plan includes chemotherapy.      CANCER HISTORY:  Oncology History  Pancreatic adenocarcinoma ([place])  03/24/2022 Initial Diagnosis   Pancreatic adenocarcinoma ([place])   03/30/2022 -  Chemotherapy   Patient is on Treatment Plan : PANCREAS Modified FOLFIRINOX q14d x 4 cycles        RISK FACTORS:  Menarche was at age 69.  First live birth at age 85.  OCP use for approximately  15+  years.  Ovaries intact: yes.  Hysterectomy: yes.  Menopausal status: postmenopausal.  HRT use: 2 years. Colonoscopy: yes;  precancerous polyps . Mammogram within the last year: yes. Number of breast biopsies: 0. Up to date with pelvic exams: yes. 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   treated with 4 cycles of actinomycin D from 12-02-11 thru 01-13-12   H/O molar pregnancy, antepartum    Hypertension    Migraine    Miscarriage     Past Surgical History:  Procedure Laterality Date   BREAST BIOPSY  2001   Left    BREAST LUMPECTOMY     DILATION AND CURETTAGE OF UTERUS  1992, 09/2011   DILATION AND EVACUATION  2011   INSERTION OF MESH N/A 09/03/2013   Procedure: INSERTION OF MESH;  Surgeon: Adin Hector, MD;  Location: [facility];  Service: General;  Laterality: N/A;   IR IMAGING GUIDED PORT INSERTION  03/22/2022   IR US LIVER BIOPSY  03/22/2022   PORTACATH PLACEMENT  11/2011   PORTACATH PLACEMENT     REMOVAL  MARCH 2014   VENTRAL HERNIA REPAIR N/A 09/03/2013   Procedure: LAPAROSCOPIC [place];  Surgeon: Adin Hector, MD;  Location: [facility];  Service: General;  Laterality: N/A;   WISDOM TOOTH EXTRACTION     in 11th grade    Social History   Socioeconomic History   Marital status: Married    Spouse name: [name]   Number of children: 2   Years of education: college   Highest education level: Not on file  Occupational History    Comment: [employer]  Tobacco Use   Smoking status: Never   Smokeless tobacco: Never  Substance and Sexual Activity   Alcohol use: Yes    Alcohol/week: 1.0 standard drink of alcohol    Types: 1 Glasses of wine per week    Comment: occas   Drug use: No   Sexual activity: Not Currently    Birth control/protection: Pill  Other Topics  Concern   Not on file  Social History Narrative   Patient is married [name]). Patient works part time at [employer].   Right handed.   Patient has her CMA.   Caffeine- None         Social Determinants of Health   Financial Resource Strain: Not on file  Food Insecurity: No Food Insecurity (03/14/2022)   Hunger Vital Sign    Worried About Running Out of Food in the Last Year: Never true    Ran Out of Food in the Last Year: Never true  Transportation Needs: No Transportation Needs (03/14/2022)   PRAPARE - Hydrologist (Medical): No    Lack of Transportation (Non-Medical): No  Physical Activity: Not on file  Stress: Not on file  Social Connections: Not on file     FAMILY HISTORY:   We obtained a detailed, 4-generation family history.  Significant diagnoses are listed below: Family History  Problem Relation Age of Onset   Breast cancer Mother        dx > 23   Heart attack Mother    Diabetes Father    Breast cancer Maternal Aunt        dx > 29   Breast cancer Maternal Aunt        dx > 46   Breast cancer Maternal Grandmother        ? < 11   Stroke Paternal Grandmother    Alcoholism Other      The patient has a son and daughter who are cancer free.  She has a brother and sister who are cancer free.  Both parents are living.  The patient's father has 10 siblings who are cancer free.  His parents died of non-cancer related issues.  The patient's mother had breast cancer in her 24's-60's.  She has five sisters and three brothers.  Two sisters were diagnosed with breast cancer over age 74's.  The maternal grandparents are deceased.  The grandmother had breast cancer under age 54's.  Kaitlin Ferrell is unaware of previous family history of genetic testing for hereditary cancer risks. Patient's maternal ancestors are of [place] American and European descent, and paternal ancestors are of [place] American and European descent. There is no reported Ashkenazi Jewish ancestry. There is no known consanguinity.  GENETIC COUNSELING ASSESSMENT: Kaitlin Ferrell is a 57 y.o. female with a personal and family history of cancer which is somewhat suggestive of a hereditary cancer syndrome and predisposition to cancer given the combination of breast and pancreatic cancer and the young age of onset of breast cancer in her grandmother. We, therefore, discussed and recommended the following at today's visit.   DISCUSSION: We discussed that, in general, most cancer is not inherited in families, but instead is sporadic or familial. Sporadic cancers occur by chance and typically happen at older ages (>50 years) as this type of cancer is caused by genetic changes acquired during an individual's lifetime.  Some families have more cancers than would be expected by chance; however, the ages or types of cancer are not consistent with a known genetic mutation or known genetic mutations have been ruled out. This type of familial cancer is thought to be due to a combination of multiple genetic, environmental, hormonal, and lifestyle factors. While this combination of factors likely increases the risk of cancer, the exact source of this risk is not currently identifiable or testable.  We discussed that up to 15% of pancreatic cancer is hereditary,  with most cases associated with BRCA mutations.  There are other genes that can be associated with hereditary pancreatic cancer syndromes.  These include ATM and PALB2. Due to her family history of breast cancer, Kaitlin Ferrell underwent genetic testing in 2019 through her OB GYN office.  We reviewed her genetic testing from 2019 which was a Multi-cancer gene panel through [facility].  Her testing was negative, other than a VUS in [place].  We discussed that since this time, RNA is being used to look in areas of the gene that were missed by DNA alone.  The RNA has a small chance of identifying a deep intronic variant within one of the breast/pancreatic cancer genes.  Due to the timing of the testing (about 5 years) we discussed that she may have an out of pocket cost of $250 for the patient pay price.  We discussed that testing is beneficial for several reasons including knowing how to follow individuals after completing their treatment, identifying whether potential treatment options such as PARP inhibitors would be beneficial, and understand if other family members could be at risk for cancer and allow them to undergo genetic testing.   We reviewed the characteristics, features and inheritance patterns of hereditary cancer syndromes. We also discussed genetic testing, including the appropriate family members to test, the process of testing, insurance coverage and turn-around-time for  results. We discussed the implications of a negative, positive, carrier and/or variant of uncertain significant result. Kaitlin Ferrell  was offered a common hereditary cancer panel (47 genes) and an expanded pan-cancer panel (77 genes). Kaitlin Ferrell was informed of the benefits and limitations of each panel, including that expanded pan-cancer panels contain genes that do not have clear management guidelines at this point in time.  We also discussed that as the number of genes included on a panel increases, the chances of variants of uncertain significance increases. Kaitlin Ferrell decided to pursue genetic testing for the BRCAPlus gene panel.  Once completed, the CancerNext-Expanded+RNAinsight gene panel will be performed.    The CancerNext-Expanded gene panel offered by [facility] and includes sequencing and rearrangement analysis for the following 77 genes: AIP, ALK, APC*, ATM*, AXIN2, BAP1, BARD1, BLM, BMPR1A, BRCA1*, BRCA2*, BRIP1*, CDC73, CDH1*, CDK4, CDKN1B, CDKN2A, CHEK2*, CTNNA1, DICER1, FANCC, FH, FLCN, GALNT12, KIF1B, LZTR1, MAX, MEN1, MET, MLH1*, MSH2*, MSH3, MSH6*, MUTYH*, NBN, NF1*, NF2, NTHL1, PALB2*, PHOX2B, PMS2*, POT1, PRKAR1A, PTCH1, PTEN*, RAD51C*, RAD51D*, RB1, RECQL, RET, SDHA, SDHAF2, SDHB, SDHC, SDHD, SMAD4, SMARCA4, SMARCB1, SMARCE1, STK11, SUFU, TMEM127, TP53*, TSC1, TSC2, VHL and XRCC2 (sequencing and deletion/duplication); EGFR, EGLN1, HOXB13, KIT, MITF, PDGFRA, POLD1, and POLE (sequencing only); EPCAM and GREM1 (deletion/duplication only). DNA and RNA analyses performed for * genes.   Based on Kaitlin Ferrell's personal and family history of cancer, she meets medical criteria for genetic testing. Despite that she meets criteria, she may still have an out of pocket cost. We discussed that if her out of pocket cost for testing is over $100, the laboratory will call and confirm whether she wants to proceed with testing.  If the out of pocket cost of testing is less than $100 she will be billed by  the genetic testing laboratory.   PLAN: After considering the risks, benefits, and limitations, Kaitlin Ferrell provided informed consent to pursue genetic testing and the blood sample was sent to [facility] for analysis of the CancerNext-Expanded+RNAinsight. Results should be available within approximately 2-3 weeks' time, at which point they will be disclosed by telephone to Kaitlin Ferrell,  as will any additional recommendations warranted by these results. Kaitlin Ferrell will receive a summary of her genetic counseling visit and a copy of her results once available. This information will also be available in Epic.   Lastly, we encouraged Kaitlin Ferrell to remain in contact with cancer genetics annually so that we can continuously update the family history and inform her of any changes in cancer genetics and testing that may be of benefit for this family.   Kaitlin Ferrell questions were answered to her satisfaction today. Our contact information was provided should additional questions or concerns arise. Thank you for the referral and allowing [place] to share in the care of your patient.   Kaitlin Ferrell P. Florene Glen, [place], [facility] Licensed, Insurance risk surveyor [email or web address] phone: [phone]  The patient was seen for a total of 30 minutes in face-to-face genetic counseling.  The patient brought her husband. Drs. Michell Heinrich, and/or [place] were available for questions, if needed..    _______________________________________________________________________ For Office Staff:  Number of people involved in session: 2 Was an Intern/ student involved with case: no

## 2022-03-30 ENCOUNTER — Telehealth: Payer: Self-pay | Admitting: Genetic Counselor

## 2022-03-30 ENCOUNTER — Inpatient Hospital Stay: Payer: BC Managed Care – PPO

## 2022-03-30 ENCOUNTER — Telehealth: Payer: Self-pay

## 2022-03-30 ENCOUNTER — Inpatient Hospital Stay (HOSPITAL_BASED_OUTPATIENT_CLINIC_OR_DEPARTMENT_OTHER): Payer: BC Managed Care – PPO | Admitting: Physician Assistant

## 2022-03-30 VITALS — BP 133/75 | HR 50 | Temp 98.1°F | Resp 18

## 2022-03-30 DIAGNOSIS — C787 Secondary malignant neoplasm of liver and intrahepatic bile duct: Secondary | ICD-10-CM

## 2022-03-30 DIAGNOSIS — C259 Malignant neoplasm of pancreas, unspecified: Secondary | ICD-10-CM

## 2022-03-30 DIAGNOSIS — C250 Malignant neoplasm of head of pancreas: Secondary | ICD-10-CM | POA: Diagnosis not present

## 2022-03-30 DIAGNOSIS — Z452 Encounter for adjustment and management of vascular access device: Secondary | ICD-10-CM | POA: Diagnosis not present

## 2022-03-30 DIAGNOSIS — R634 Abnormal weight loss: Secondary | ICD-10-CM | POA: Diagnosis not present

## 2022-03-30 DIAGNOSIS — Z5111 Encounter for antineoplastic chemotherapy: Secondary | ICD-10-CM | POA: Diagnosis not present

## 2022-03-30 DIAGNOSIS — Z803 Family history of malignant neoplasm of breast: Secondary | ICD-10-CM | POA: Diagnosis not present

## 2022-03-30 DIAGNOSIS — Z95828 Presence of other vascular implants and grafts: Secondary | ICD-10-CM

## 2022-03-30 DIAGNOSIS — Z7901 Long term (current) use of anticoagulants: Secondary | ICD-10-CM | POA: Diagnosis not present

## 2022-03-30 DIAGNOSIS — R630 Anorexia: Secondary | ICD-10-CM | POA: Diagnosis not present

## 2022-03-30 DIAGNOSIS — K5900 Constipation, unspecified: Secondary | ICD-10-CM | POA: Diagnosis not present

## 2022-03-30 DIAGNOSIS — I81 Portal vein thrombosis: Secondary | ICD-10-CM | POA: Diagnosis not present

## 2022-03-30 LAB — CBC WITH DIFFERENTIAL (CANCER CENTER ONLY)
Abs Immature Granulocytes: 0 10*3/uL (ref 0.00–0.07)
Basophils Absolute: 0 10*3/uL (ref 0.0–0.1)
Basophils Relative: 1 %
Eosinophils Absolute: 0.1 10*3/uL (ref 0.0–0.5)
Eosinophils Relative: 2 %
HCT: 35.4 % — ABNORMAL LOW (ref 36.0–46.0)
Hemoglobin: 11.6 g/dL — ABNORMAL LOW (ref 12.0–15.0)
Immature Granulocytes: 0 %
Lymphocytes Relative: 34 %
Lymphs Abs: 1.6 10*3/uL (ref 0.7–4.0)
MCH: 28 pg (ref 26.0–34.0)
MCHC: 32.8 g/dL (ref 30.0–36.0)
MCV: 85.5 fL (ref 80.0–100.0)
Monocytes Absolute: 0.4 10*3/uL (ref 0.1–1.0)
Monocytes Relative: 9 %
Neutro Abs: 2.5 10*3/uL (ref 1.7–7.7)
Neutrophils Relative %: 54 %
Platelet Count: 197 10*3/uL (ref 150–400)
RBC: 4.14 MIL/uL (ref 3.87–5.11)
RDW: 13.2 % (ref 11.5–15.5)
WBC Count: 4.6 10*3/uL (ref 4.0–10.5)
nRBC: 0 % (ref 0.0–0.2)

## 2022-03-30 LAB — CMP (CANCER CENTER ONLY)
ALT: 24 U/L (ref 0–44)
AST: 27 U/L (ref 15–41)
Albumin: 4.2 g/dL (ref 3.5–5.0)
Alkaline Phosphatase: 73 U/L (ref 38–126)
Anion gap: 5 (ref 5–15)
BUN: 11 mg/dL (ref 6–20)
CO2: 28 mmol/L (ref 22–32)
Calcium: 9 mg/dL (ref 8.9–10.3)
Chloride: 106 mmol/L (ref 98–111)
Creatinine: 0.75 mg/dL (ref 0.44–1.00)
GFR, Estimated: >60 mL/min (ref >60)
Glucose, Bld: 101 mg/dL — ABNORMAL HIGH (ref 70–99)
Potassium: 4 mmol/L (ref 3.5–5.1)
Sodium: 139 mmol/L (ref 135–145)
Total Bilirubin: 0.5 mg/dL (ref 0.3–1.2)
Total Protein: 7.2 g/dL (ref 6.5–8.1)

## 2022-03-30 MED ORDER — ATROPINE SULFATE 1 MG/ML IV SOLN
0.5000 mg | Freq: Once | INTRAVENOUS | Status: DC | PRN
  Filled 2022-03-30: qty 1

## 2022-03-30 MED ORDER — SODIUM CHLORIDE 0.9 % IV SOLN
2400.0000 mg/m2 | INTRAVENOUS | Status: DC
  Administered 2022-03-30: 4250 mg via INTRAVENOUS
  Filled 2022-03-30: qty 85

## 2022-03-30 MED ORDER — SODIUM CHLORIDE 0.9 % IV SOLN
10.0000 mg | Freq: Once | INTRAVENOUS | Status: AC
  Administered 2022-03-30: 10 mg via INTRAVENOUS
  Filled 2022-03-30: qty 10

## 2022-03-30 MED ORDER — SODIUM CHLORIDE 0.9 % IV SOLN
150.0000 mg/m2 | Freq: Once | INTRAVENOUS | Status: AC
  Administered 2022-03-30: 260 mg via INTRAVENOUS
  Filled 2022-03-30: qty 13

## 2022-03-30 MED ORDER — SODIUM CHLORIDE 0.9 % IV SOLN
150.0000 mg | Freq: Once | INTRAVENOUS | Status: AC
  Administered 2022-03-30: 150 mg via INTRAVENOUS
  Filled 2022-03-30: qty 150

## 2022-03-30 MED ORDER — SODIUM CHLORIDE 0.9% FLUSH
10.0000 mL | INTRAVENOUS | Status: AC | PRN
  Administered 2022-03-30: 10 mL

## 2022-03-30 MED ORDER — SODIUM CHLORIDE 0.9 % IV SOLN
400.0000 mg/m2 | Freq: Once | INTRAVENOUS | Status: AC
  Administered 2022-03-30: 712 mg via INTRAVENOUS
  Filled 2022-03-30: qty 35.6

## 2022-03-30 MED ORDER — SODIUM CHLORIDE 0.9% FLUSH
10.0000 mL | INTRAVENOUS | Status: DC | PRN
  Administered 2022-03-30: 10 mL

## 2022-03-30 MED ORDER — MORPHINE SULFATE ER 15 MG PO TBCR: 15.0000 mg | EXTENDED_RELEASE_TABLET | Freq: Two times a day (BID) | ORAL | 0 refills | Status: DC

## 2022-03-30 MED ORDER — DEXTROSE 5 % IV SOLN: Freq: Once | INTRAVENOUS | Status: AC

## 2022-03-30 MED ORDER — PALONOSETRON HCL INJECTION 0.25 MG/5ML
0.2500 mg | Freq: Once | INTRAVENOUS | Status: AC
  Administered 2022-03-30: 0.25 mg via INTRAVENOUS
  Filled 2022-03-30: qty 5

## 2022-03-30 MED ORDER — OXALIPLATIN CHEMO INJECTION 100 MG/20ML
85.0000 mg/m2 | Freq: Once | INTRAVENOUS | Status: AC
  Administered 2022-03-30: 150 mg via INTRAVENOUS
  Filled 2022-03-30: qty 10

## 2022-03-30 NOTE — Telephone Encounter (Signed)
[facility] BRCAplus and CancerNext-Expanded Panel+RNA was ordered on 03/25/2022.  Lucille Passy, MS, [facility] Genetic Counselor [email or web address] (P) [phone]

## 2022-03-30 NOTE — Telephone Encounter (Signed)
Sent long-acting MS contin 15 mg with directions to take every 12 hours.

## 2022-03-30 NOTE — Telephone Encounter (Signed)
T/C from pt stating she is in severe pain and her pain meds are not controlling her pain.  She had to take 2 hydrocodone 5-325 and they did not touch her pain.  She is requesting something else for pain that is stronger, fast acting and without tylenol be sent to [facility] on [address].

## 2022-03-30 NOTE — Patient Instructions (Signed)
Kaitlin Ferrell  Discharge Instructions: Thank you for choosing [facility] to provide your oncology and hematology care.   If you have a lab appointment with the [facility], please go directly to the [facility] and check in at the registration area.   Wear comfortable clothing and clothing appropriate for easy access to any Portacath or PICC line.   We strive to give you quality time with your provider. You may need to reschedule your appointment if you arrive late (15 or more minutes).  Arriving late affects you and other patients whose appointments are after yours.  Also, if you miss three or more appointments without notifying the office, you may be dismissed from the clinic at the provider's discretion.      For prescription refill requests, have your pharmacy contact our office and allow 72 hours for refills to be completed.    Today you received the following chemotherapy and/or immunotherapy agents Oxaliplatin, Irinotecan, Leucovorin, 5FU pump      To help prevent nausea and vomiting after your treatment, we encourage you to take your nausea medication as directed.  BELOW ARE SYMPTOMS THAT SHOULD BE REPORTED IMMEDIATELY: *FEVER GREATER THAN 100.4 F (38 C) OR HIGHER *CHILLS OR SWEATING *NAUSEA AND VOMITING THAT IS NOT CONTROLLED WITH YOUR NAUSEA MEDICATION *UNUSUAL SHORTNESS OF BREATH *UNUSUAL BRUISING OR BLEEDING *URINARY PROBLEMS (pain or burning when urinating, or frequent urination) *BOWEL PROBLEMS (unusual diarrhea, constipation, pain near the anus) TENDERNESS IN MOUTH AND THROAT WITH OR WITHOUT PRESENCE OF ULCERS (sore throat, sores in mouth, or a toothache) UNUSUAL RASH, SWELLING OR PAIN  UNUSUAL VAGINAL DISCHARGE OR ITCHING   Items with * indicate a potential emergency and should be followed up as soon as possible or go to the Emergency Department if any problems should occur.  Please show the CHEMOTHERAPY ALERT CARD  or IMMUNOTHERAPY ALERT CARD at check-in to the Emergency Department and triage nurse.  Should you have questions after your visit or need to cancel or reschedule your appointment, please contact [facility]  Dept: [phone]  and follow the prompts.  Office hours are 8:00 a.m. to 4:30 p.m. Monday - Friday. Please note that voicemails left after 4:00 p.m. may not be returned until the following business day.  We are closed weekends and major holidays. You have access to a nurse at all times for urgent questions. Please call the main number to the clinic Dept: [phone] and follow the prompts.   For any non-urgent questions, you may also contact your provider using MyChart. We now offer e-Visits for anyone 74 and older to request care online for non-urgent symptoms. For details visit [email or web address].   Also download the MyChart app! Go to the app store, search "MyChart", open the app, select [facility], and log in with your MyChart username and password.  Oxaliplatin Injection What is this medication? OXALIPLATIN (ox AL i PLA tin) treats some types of cancer. It works by slowing down the growth of cancer cells. This medicine may be used for other purposes; ask your health care provider or pharmacist if you have questions. COMMON BRAND NAME(S): Eloxatin What should I tell my care team before I take this medication? They need to know if you have any of these conditions: Heart disease History of irregular heartbeat or rhythm Liver disease Low blood cell levels (white cells, red cells, and platelets) Lung or breathing disease, such as asthma Take medications  that treat or prevent blood clots Tingling of the fingers, toes, or other nerve disorder An unusual or allergic reaction to oxaliplatin, other medications, foods, dyes, or preservatives If you or your partner are pregnant or trying to get pregnant Breast-feeding How should I use this  medication? This medication is injected into a vein. It is given by your care team in a hospital or clinic setting. Talk to your care team about the use of this medication in children. Special care may be needed. Overdosage: If you think you have taken too much of this medicine contact a poison control center or emergency room at once. NOTE: This medicine is only for you. Do not share this medicine with others. What if I miss a dose? Keep appointments for follow-up doses. It is important not to miss a dose. Call your care team if you are unable to keep an appointment. What may interact with this medication? Do not take this medication with any of the following: Cisapride Dronedarone Pimozide Thioridazine This medication may also interact with the following: Aspirin and aspirin-like medications Certain medications that treat or prevent blood clots, such as warfarin, apixaban, dabigatran, and rivaroxaban Cisplatin Cyclosporine Diuretics Medications for infection, such as acyclovir, adefovir, amphotericin B, bacitracin, cidofovir, foscarnet, ganciclovir, gentamicin, pentamidine, vancomycin NSAIDs, medications for pain and inflammation, such as ibuprofen or naproxen Other medications that cause heart rhythm changes Pamidronate Zoledronic acid This list may not describe all possible interactions. Give your health care provider a list of all the medicines, herbs, non-prescription drugs, or dietary supplements you use. Also tell them if you smoke, drink alcohol, or use illegal drugs. Some items may interact with your medicine. What should I watch for while using this medication? Your condition will be monitored carefully while you are receiving this medication. You may need blood work while taking this medication. This medication may make you feel generally unwell. This is not uncommon as chemotherapy can affect healthy cells as well as cancer cells. Report any side effects. Continue your course  of treatment even though you feel ill unless your care team tells you to stop. This medication may increase your risk of getting an infection. Call your care team for advice if you get a fever, chills, sore throat, or other symptoms of a cold or flu. Do not treat yourself. Try to avoid being around people who are sick. Avoid taking medications that contain aspirin, acetaminophen, ibuprofen, naproxen, or ketoprofen unless instructed by your care team. These medications may hide a fever. Be careful brushing or flossing your teeth or using a toothpick because you may get an infection or bleed more easily. If you have any dental work done, tell your dentist you are receiving this medication. This medication can make you more sensitive to cold. Do not drink cold drinks or use ice. Cover exposed skin before coming in contact with cold temperatures or cold objects. When out in cold weather wear warm clothing and cover your mouth and nose to warm the air that goes into your lungs. Tell your care team if you get sensitive to the cold. Talk to your care team if you or your partner are pregnant or think either of you might be pregnant. This medication can cause serious birth defects if taken during pregnancy and for 9 months after the last dose. A negative pregnancy test is required before starting this medication. A reliable form of contraception is recommended while taking this medication and for 9 months after the last dose. Talk to  your care team about effective forms of contraception. Do not father a child while taking this medication and for 6 months after the last dose. Use a condom while having sex during this time period. Do not breastfeed while taking this medication and for 3 months after the last dose. This medication may cause infertility. Talk to your care team if you are concerned about your fertility. What side effects may I notice from receiving this medication? Side effects that you should report to  your care team as soon as possible: Allergic reactions--skin rash, itching, hives, swelling of the face, lips, tongue, or throat Bleeding--bloody or black, tar-like stools, vomiting blood or brown material that looks like coffee grounds, red or dark brown urine, small red or purple spots on skin, unusual bruising or bleeding Dry cough, shortness of breath or trouble breathing Heart rhythm changes--fast or irregular heartbeat, dizziness, feeling faint or lightheaded, chest pain, trouble breathing Infection--fever, chills, cough, sore throat, wounds that don't heal, pain or trouble when passing urine, general feeling of discomfort or being unwell Liver injury--right upper belly pain, loss of appetite, nausea, light-colored stool, dark yellow or brown urine, yellowing skin or eyes, unusual weakness or fatigue Low red blood cell level--unusual weakness or fatigue, dizziness, headache, trouble breathing Muscle injury--unusual weakness or fatigue, muscle pain, dark yellow or brown urine, decrease in amount of urine Pain, tingling, or numbness in the hands or feet Sudden and severe headache, confusion, change in vision, seizures, which may be signs of posterior reversible encephalopathy syndrome (PRES) Unusual bruising or bleeding Side effects that usually do not require medical attention (report to your care team if they continue or are bothersome): Diarrhea Nausea Pain, redness, or swelling with sores inside the mouth or throat Unusual weakness or fatigue Vomiting This list may not describe all possible side effects. Call your doctor for medical advice about side effects. You may report side effects to FDA at 1-800-FDA-1088. Where should I keep my medication? This medication is given in a hospital or clinic. It will not be stored at home. NOTE: This sheet is a summary. It may not cover all possible information. If you have questions about this medicine, talk to your doctor, pharmacist, or health care  provider.  2023 Elsevier/Gold Standard (2007-03-17 00:00:00)  Irinotecan Injection What is this medication? IRINOTECAN (ir in oh TEE kan) treats some types of cancer. It works by slowing down the growth of cancer cells. This medicine may be used for other purposes; ask your health care provider or pharmacist if you have questions. COMMON BRAND NAME(S): Camptosar What should I tell my care team before I take this medication? They need to know if you have any of these conditions: Dehydration Diarrhea Infection, especially a viral infection, such as chickenpox, cold sores, herpes Liver disease Low blood cell levels (white cells, red cells, and platelets) Low levels of electrolytes, such as calcium, magnesium, or potassium in your blood Recent or ongoing radiation An unusual or allergic reaction to irinotecan, other medications, foods, dyes, or preservatives If you or your partner are pregnant or trying to get pregnant Breast-feeding How should I use this medication? This medication is injected into a vein. It is given by your care team in a hospital or clinic setting. Talk to your care team about the use of this medication in children. Special care may be needed. Overdosage: If you think you have taken too much of this medicine contact a poison control center or emergency room at once. NOTE: This medicine  is only for you. Do not share this medicine with others. What if I miss a dose? Keep appointments for follow-up doses. It is important not to miss your dose. Call your care team if you are unable to keep an appointment. What may interact with this medication? Do not take this medication with any of the following: Cobicistat Itraconazole This medication may also interact with the following: Certain antibiotics, such as clarithromycin, rifampin, rifabutin Certain antivirals for HIV or AIDS Certain medications for fungal infections, such as ketoconazole, posaconazole,  voriconazole Certain medications for seizures, such as carbamazepine, phenobarbital, phenytoin Gemfibrozil Nefazodone St. John's wort This list may not describe all possible interactions. Give your health care provider a list of all the medicines, herbs, non-prescription drugs, or dietary supplements you use. Also tell them if you smoke, drink alcohol, or use illegal drugs. Some items may interact with your medicine. What should I watch for while using this medication? Your condition will be monitored carefully while you are receiving this medication. You may need blood work while taking this medication. This medication may make you feel generally unwell. This is not uncommon as chemotherapy can affect healthy cells as well as cancer cells. Report any side effects. Continue your course of treatment even though you feel ill unless your care team tells you to stop. This medication can cause serious side effects. To reduce the risk, your care team may give you other medications to take before receiving this one. Be sure to follow the directions from your care team. This medication may affect your coordination, reaction time, or judgement. Do not drive or operate machinery until you know how this medication affects you. Sit up or stand slowly to reduce the risk of dizzy or fainting spells. Drinking alcohol with this medication can increase the risk of these side effects. This medication may increase your risk of getting an infection. Call your care team for advice if you get a fever, chills, sore throat, or other symptoms of a cold or flu. Do not treat yourself. Try to avoid being around people who are sick. Avoid taking medications that contain aspirin, acetaminophen, ibuprofen, naproxen, or ketoprofen unless instructed by your care team. These medications may hide a fever. This medication may increase your risk to bruise or bleed. Call your care team if you notice any unusual bleeding. Be careful  brushing or flossing your teeth or using a toothpick because you may get an infection or bleed more easily. If you have any dental work done, tell your dentist you are receiving this medication. Talk to your care team if you or your partner are pregnant or think either of you might be pregnant. This medication can cause serious birth defects if taken during pregnancy and for 6 months after the last dose. You will need a negative pregnancy test before starting this medication. Contraception is recommended while taking this medication and for 6 months after the last dose. Your care team can help you find the option that works for you. Do not father a child while taking this medication and for 3 months after the last dose. Use a condom for contraception during this time period. Do not breastfeed while taking this medication and for 7 days after the last dose. This medication may cause infertility. Talk to your care team if you are concerned about your fertility. What side effects may I notice from receiving this medication? Side effects that you should report to your care team as soon as possible: Allergic reactions--skin  rash, itching, hives, swelling of the face, lips, tongue, or throat Dry cough, shortness of breath or trouble breathing Increased saliva or tears, increased sweating, stomach cramping, diarrhea, small pupils, unusual weakness or fatigue, slow heartbeat Infection--fever, chills, cough, sore throat, wounds that don't heal, pain or trouble when passing urine, general feeling of discomfort or being unwell Kidney injury--decrease in the amount of urine, swelling of the ankles, hands, or feet Low red blood cell level--unusual weakness or fatigue, dizziness, headache, trouble breathing Severe or prolonged diarrhea Unusual bruising or bleeding Side effects that usually do not require medical attention (report to your care team if they continue or are bothersome): Constipation Diarrhea Hair  loss Loss of appetite Nausea Stomach pain This list may not describe all possible side effects. Call your doctor for medical advice about side effects. You may report side effects to FDA at 1-800-FDA-1088. Where should I keep my medication? This medication is given in a hospital or clinic. It will not be stored at home. NOTE: This sheet is a summary. It may not cover all possible information. If you have questions about this medicine, talk to your doctor, pharmacist, or health care provider.  2023 Elsevier/Gold Standard (2021-06-03 00:00:00)  Leucovorin Injection What is this medication? LEUCOVORIN (loo koe VOR in) prevents side effects from certain medications, such as methotrexate. It works by increasing folate levels. This helps protect healthy cells in your body. It may also be used to treat anemia caused by low levels of folate. It can also be used with fluorouracil, a type of chemotherapy, to treat colorectal cancer. It works by increasing the effects of fluorouracil in the body. This medicine may be used for other purposes; ask your health care provider or pharmacist if you have questions. What should I tell my care team before I take this medication? They need to know if you have any of these conditions: Anemia from low levels of vitamin B12 in the blood An unusual or allergic reaction to leucovorin, folic acid, other medications, foods, dyes, or preservatives Pregnant or trying to get pregnant Breastfeeding How should I use this medication? This medication is injected into a vein or a muscle. It is given by your care team in a hospital or clinic setting. Talk to your care team about the use of this medication in children. Special care may be needed. Overdosage: If you think you have taken too much of this medicine contact a poison control center or emergency room at once. NOTE: This medicine is only for you. Do not share this medicine with others. What if I miss a dose? Keep  appointments for follow-up doses. It is important not to miss your dose. Call your care team if you are unable to keep an appointment. What may interact with this medication? Capecitabine Fluorouracil Phenobarbital Phenytoin Primidone Trimethoprim;sulfamethoxazole This list may not describe all possible interactions. Give your health care provider a list of all the medicines, herbs, non-prescription drugs, or dietary supplements you use. Also tell them if you smoke, drink alcohol, or use illegal drugs. Some items may interact with your medicine. What should I watch for while using this medication? Your condition will be monitored carefully while you are receiving this medication. This medication may increase the side effects of 5-fluorouracil. Tell your care team if you have diarrhea or mouth sores that do not get better or that get worse. What side effects may I notice from receiving this medication? Side effects that you should report to your care team as  soon as possible: Allergic reactions--skin rash, itching, hives, swelling of the face, lips, tongue, or throat This list may not describe all possible side effects. Call your doctor for medical advice about side effects. You may report side effects to FDA at 1-800-FDA-1088. Where should I keep my medication? This medication is given in a hospital or clinic. It will not be stored at home. NOTE: This sheet is a summary. It may not cover all possible information. If you have questions about this medicine, talk to your doctor, pharmacist, or health care provider.  2023 Elsevier/Gold Standard (2021-06-29 00:00:00)  Fluorouracil Injection What is this medication? FLUOROURACIL (flure oh YOOR a sil) treats some types of cancer. It works by slowing down the growth of cancer cells. This medicine may be used for other purposes; ask your health care provider or pharmacist if you have questions. COMMON BRAND NAME(S): Adrucil What should I tell my  care team before I take this medication? They need to know if you have any of these conditions: Blood disorders Dihydropyrimidine dehydrogenase (DPD) deficiency Infection, such as chickenpox, cold sores, herpes Kidney disease Liver disease Poor nutrition Recent or ongoing radiation therapy An unusual or allergic reaction to fluorouracil, other medications, foods, dyes, or preservatives If you or your partner are pregnant or trying to get pregnant Breast-feeding How should I use this medication? This medication is injected into a vein. It is administered by your care team in a hospital or clinic setting. Talk to your care team about the use of this medication in children. Special care may be needed. Overdosage: If you think you have taken too much of this medicine contact a poison control center or emergency room at once. NOTE: This medicine is only for you. Do not share this medicine with others. What if I miss a dose? Keep appointments for follow-up doses. It is important not to miss your dose. Call your care team if you are unable to keep an appointment. What may interact with this medication? Do not take this medication with any of the following: Live virus vaccines This medication may also interact with the following: Medications that treat or prevent blood clots, such as warfarin, enoxaparin, dalteparin This list may not describe all possible interactions. Give your health care provider a list of all the medicines, herbs, non-prescription drugs, or dietary supplements you use. Also tell them if you smoke, drink alcohol, or use illegal drugs. Some items may interact with your medicine. What should I watch for while using this medication? Your condition will be monitored carefully while you are receiving this medication. This medication may make you feel generally unwell. This is not uncommon as chemotherapy can affect healthy cells as well as cancer cells. Report any side effects.  Continue your course of treatment even though you feel ill unless your care team tells you to stop. In some cases, you may be given additional medications to help with side effects. Follow all directions for their use. This medication may increase your risk of getting an infection. Call your care team for advice if you get a fever, chills, sore throat, or other symptoms of a cold or flu. Do not treat yourself. Try to avoid being around people who are sick. This medication may increase your risk to bruise or bleed. Call your care team if you notice any unusual bleeding. Be careful brushing or flossing your teeth or using a toothpick because you may get an infection or bleed more easily. If you have any dental work  done, tell your dentist you are receiving this medication. Avoid taking medications that contain aspirin, acetaminophen, ibuprofen, naproxen, or ketoprofen unless instructed by your care team. These medications may hide a fever. Do not treat diarrhea with over the counter products. Contact your care team if you have diarrhea that lasts more than 2 days or if it is severe and watery. This medication can make you more sensitive to the sun. Keep out of the sun. If you cannot avoid being in the sun, wear protective clothing and sunscreen. Do not use sun lamps, tanning beds, or tanning booths. Talk to your care team if you or your partner wish to become pregnant or think you might be pregnant. This medication can cause serious birth defects if taken during pregnancy and for 3 months after the last dose. A reliable form of contraception is recommended while taking this medication and for 3 months after the last dose. Talk to your care team about effective forms of contraception. Do not father a child while taking this medication and for 3 months after the last dose. Use a condom while having sex during this time period. Do not breastfeed while taking this medication. This medication may cause  infertility. Talk to your care team if you are concerned about your fertility. What side effects may I notice from receiving this medication? Side effects that you should report to your care team as soon as possible: Allergic reactions--skin rash, itching, hives, swelling of the face, lips, tongue, or throat Heart attack--pain or tightness in the chest, shoulders, arms, or jaw, nausea, shortness of breath, cold or clammy skin, feeling faint or lightheaded Heart failure--shortness of breath, swelling of the ankles, feet, or hands, sudden weight gain, unusual weakness or fatigue Heart rhythm changes--fast or irregular heartbeat, dizziness, feeling faint or lightheaded, chest pain, trouble breathing High ammonia level--unusual weakness or fatigue, confusion, loss of appetite, nausea, vomiting, seizures Infection--fever, chills, cough, sore throat, wounds that don't heal, pain or trouble when passing urine, general feeling of discomfort or being unwell Low red blood cell level--unusual weakness or fatigue, dizziness, headache, trouble breathing Pain, tingling, or numbness in the hands or feet, muscle weakness, change in vision, confusion or trouble speaking, loss of balance or coordination, trouble walking, seizures Redness, swelling, and blistering of the skin over hands and feet Severe or prolonged diarrhea Unusual bruising or bleeding Side effects that usually do not require medical attention (report to your care team if they continue or are bothersome): Dry skin Headache Increased tears Nausea Pain, redness, or swelling with sores inside the mouth or throat Sensitivity to light Vomiting This list may not describe all possible side effects. Call your doctor for medical advice about side effects. You may report side effects to FDA at 1-800-FDA-1088. Where should I keep my medication? This medication is given in a hospital or clinic. It will not be stored at home. NOTE: This sheet is a summary.  It may not cover all possible information. If you have questions about this medicine, talk to your doctor, pharmacist, or health care provider.  2023 [employer] (2021-05-25 00:00:00)

## 2022-03-30 NOTE — Addendum Note (Signed)
Addended by: Dede Query T on: 03/30/2022 04:10 PM   Modules accepted: Orders

## 2022-03-30 NOTE — Telephone Encounter (Signed)
Pt advised rx sent in for MS contin 15 and to take one every 12 hours and OK to take hydrocodone 5-325 every 6 hrs as needed for breakthrough pain.  Pt with [facility] and agreed to plan

## 2022-03-30 NOTE — Progress Notes (Signed)
[place] [phone]   [phone]) [phone]  PROGRESS NOTE:  Patient Care Team: Robyne Peers, MD as PCP - General (Family Medicine) Gordy Levan, MD as Consulting Physician (Medical Oncology)  CHIEF COMPLAINTS/PURPOSE OF CONSULTATION:  Metastatic pancreatic adenocarcinoma involving the liver  ONCOLOGIC HISTORY: Presented with nausea and right upper quadrant pain 03/08/2022: Abdominal US showed multiple hypoechoic masses within the liver are indeterminate.  03/10/2022: MR abdomen: Hypoenhancing mass compatible with pancreatic head adenocarcinoma with numerous targetoid enhancing masses in all segments of the liver, compatible with metastatic disease. The mass severely effaces and nearly occludes the portal vein and splenic vein, and occludes the superior mesenteric vein with some collaterals noted. The mass substantially abuts the proximal transverse duodenum, although overt duodenal invasion is indeterminate. 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 Prominent stool throughout the colon favors constipation. Fluid-fluid level in the gallbladder likely from sludge. 03/14/2022: Establish care with [facility] 03/17/2022: CT chest: no definitive evidence of lung metastases.  03/22/2022: Liver biopsy confirmed metastatic adenocarcinoma, pancreatic primary.  03/30/2022: Start Cycle 1, Day 1 of mFOLFIRINOX  HISTORY OF PRESENTING ILLNESS:  Kaitlin Ferrell was seen in the infusion center today as she starts Cycle 1, Day 1 of mFOLFIRINOX.   [name] reports her energy and appetite are overall stable. She does  have some epigastric and RUQ pain that is worse later in the afternoon and in the evening. She takes 2 Norco tablets before bedtime and 1 Norco tablet during the day which provides improvement of pain. She denies nausea or vomiting. Her constipation is still present but she is trying to be more aggressive with stool softeners/laxatives.   She denies easy bruising or signs of active bleeding..She denies fevers, chills, sweats, shortness of breath, chest pain or cough. She has no other complaints. Rest of the 10 point ROS is below.  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   treated with 4 cycles of actinomycin D from 12-02-11 thru 01-13-12   H/O molar pregnancy, antepartum    Hypertension    Migraine    Miscarriage     SURGICAL HISTORY: Past Surgical History:  Procedure Laterality Date   BREAST BIOPSY  2001   Left   BREAST LUMPECTOMY     DILATION AND CURETTAGE OF UTERUS  1992, 09/2011   DILATION AND EVACUATION  2011   INSERTION OF MESH N/A 09/03/2013   Procedure: INSERTION OF MESH;  Surgeon: Adin Hector, MD;  Location: [facility];  Service: General;  Laterality: N/A;   IR IMAGING GUIDED PORT INSERTION  03/22/2022   IR US LIVER BIOPSY  03/22/2022   PORTACATH PLACEMENT  11/2011   PORTACATH PLACEMENT     REMOVAL  MARCH 2014   VENTRAL HERNIA REPAIR N/A 09/03/2013   Procedure: LAPAROSCOPIC VENTRAL WALL HERNIA REPAIR;  Surgeon: Adin Hector, MD;  Location: [facility];  Service: General;  Laterality: N/A;   WISDOM TOOTH EXTRACTION     in 11th grade  SOCIAL HISTORY: Social History   Socioeconomic History   Marital status: Married    Spouse name: Kaitlin Ferrell   Number of children: 2   Years of education: college   Highest education level: Not on file  Occupational History    Comment: [employer]   Tobacco Use   Smoking status: Never   Smokeless tobacco: Never  Substance and Sexual Activity   Alcohol use: Yes    Alcohol/week: 1.0 standard drink of alcohol    Types: 1 Glasses of wine per week    Comment: occas   Drug use: No   Sexual activity: Not Currently    Birth control/protection: Pill  Other Topics Concern   Not on file  Social History Narrative   Patient is married Kaitlin Ferrell). Patient works part time at [employer].   Right handed.   Patient has her CMA.   Caffeine- None         Social Determinants of Health   Financial Resource Strain: Not on file  Food Insecurity: No Food Insecurity (03/14/2022)   Hunger Vital Sign    Worried About Running Out of Food in the Last Year: Never true    Ran Out of Food in the Last Year: Never true  Transportation Needs: No Transportation Needs (03/14/2022)   PRAPARE - Hydrologist (Medical): No    Lack of Transportation (Non-Medical): No  Physical Activity: Not on file  Stress: Not on file  Social Connections: Not on file  Intimate Partner Violence: Not At Risk (03/14/2022)   Humiliation, Afraid, Rape, and Kick questionnaire    Fear of Current or Ex-Partner: No    Emotionally Abused: No    Physically Abused: No    Sexually Abused: No    FAMILY HISTORY: Family History  Problem Relation Age of Onset   Breast cancer Mother        dx > 4   Heart attack Mother    Diabetes Father    Breast cancer Maternal Aunt        dx > 56   Breast cancer Maternal Aunt        dx > 29   Breast cancer Maternal Grandmother        ? < 50   Stroke Paternal Grandmother    Alcoholism Other     ALLERGIES:  is allergic to compazine [prochlorperazine edisylate], propofol, oxycodone-acetaminophen, prochlorperazine, and labetalol.  MEDICATIONS:  Current Outpatient Medications  Medication Sig Dispense Refill   OLANZapine (ZYPREXA) 10 MG tablet Take 1 tablet (10 mg total) by mouth at bedtime. 30 tablet 0   acetaminophen  (TYLENOL) 325 MG tablet Take 650 mg by mouth every 6 (six) hours as needed for headache (if relpax does not help).     amLODipine (NORVASC) 10 MG tablet Take 1 tablet by mouth daily.     APIXABAN (ELIQUIS) VTE STARTER PACK (10MG AND 5MG) Take as directed on package: start with two-27m tablets twice daily for 7 days. On day 8, switch to one-5100mtablet twice daily. Start after liver biopsy and port placement. 1 each 0   eletriptan (RELPAX) 40 MG tablet Take 1 tablet (40 mg total) by mouth as needed for migraine or headache. May repeat in 2 hours if needed. 15 tablet 6   HYDROcodone-acetaminophen (NORCO) 5-325 MG tablet Take 1 tablet by mouth every 6 (six) hours as needed for moderate pain. 30 tablet 0   ibuprofen (ADVIL,MOTRIN) 200 MG tablet Take 400 mg by mouth  every 6 (six) hours as needed.     lidocaine-prilocaine (EMLA) cream Apply 1 Application topically as needed. 30 g 0   magnesium oxide (MAG-OX) 400 (241.3 MG) MG tablet TAKE 1 TABLET BY MOUTH TWICE A DAY 60 tablet 6   ondansetron (ZOFRAN) 8 MG tablet Take 1 tablet (8 mg total) by mouth every 8 (eight) hours as needed for nausea or vomiting. 90 tablet 0   Riboflavin (B2) 100 MG TABS Take 1 tablet by mouth 2 (two) times daily.  12   No current facility-administered medications for this visit.   Facility-Administered Medications Ordered in Other Visits  Medication Dose Route Frequency Provider Last Rate Last Admin   atropine injection 0.5 mg  0.5 mg Intravenous Once PRN Orson Slick, MD       fluorouracil (ADRUCIL) 4,250 mg in sodium chloride 0.9 % 65 mL chemo infusion  2,400 mg/m2 (Treatment Plan Recorded) Intravenous 1 day or 1 dose Orson Slick, MD       irinotecan (CAMPTOSAR) 260 mg in sodium chloride 0.9 % 500 mL chemo infusion  150 mg/m2 (Treatment Plan Recorded) Intravenous Once Orson Slick, MD       leucovorin 712 mg in sodium chloride 0.9 % 250 mL infusion  400 mg/m2 (Treatment Plan Recorded) Intravenous Once Orson Slick, MD       oxaliplatin (ELOXATIN) 150 mg in dextrose 5 % 500 mL chemo infusion  85 mg/m2 (Treatment Plan Recorded) Intravenous Once Orson Slick, MD 265 mL/hr at 03/30/22 1016 150 mg at 03/30/22 1016   sodium chloride flush (NS) 0.9 % injection 10 mL  10 mL Intracatheter PRN Orson Slick, MD        REVIEW OF SYSTEMS:   Constitutional: ( - ) fevers, ( - )  chills , ( - ) night sweats Eyes: ( - ) blurriness of vision, ( - ) double vision, ( - ) watery eyes Ears, nose, mouth, throat, and face: ( - ) mucositis, ( - ) sore throat Respiratory: ( - ) cough, ( - ) dyspnea, ( - ) wheezes Cardiovascular: ( - ) palpitation, ( - ) chest discomfort, ( - ) lower extremity swelling Gastrointestinal:  (-) nausea, ( - ) heartburn, ( +) change in bowel habits Skin: ( - ) abnormal skin rashes Lymphatics: ( - ) new lymphadenopathy, ( - ) easy bruising Neurological: ( - ) numbness, ( - ) tingling, ( - ) new weaknesses Behavioral/Psych: ( - ) mood change, ( - ) new changes  All other systems were reviewed with the patient and are negative.  PHYSICAL EXAMINATION: ECOG PERFORMANCE STATUS: 1 - Symptomatic but completely ambulatory  There were no vitals filed for this visit.  There were no vitals filed for this visit.  Day 1, Cycle 1 03/30/22  Temp 98.1 F (36.7 C)  Temp src Oral  Pulse 50 !  Resp 18  BP 133/75    GENERAL: well appearing female in NAD  SKIN: skin color, texture, turgor are normal, no rashes or significant lesions EYES: conjunctiva are pink and non-injected, sclera clear LUNGS: clear to auscultation and percussion with normal breathing effort HEART: regular rate & rhythm and no murmurs and no lower extremity edema ABDOMEN:  normal bowel sounds. Abdomen is tender to palpation in epigastric and RUQ region. No palpable masses or hepatomegaly appreciated.  Musculoskeletal: no cyanosis of digits and no clubbing  PSYCH: alert & oriented x 3, fluent speech  NEURO: no focal  motor/sensory deficits  LABORATORY DATA:  I have reviewed the data as listed    Latest Ref Rng & Units 03/30/2022    7:49 AM 03/25/2022    9:58 AM 03/22/2022   12:30 PM  CBC  WBC 4.0 - 10.5 K/uL 4.6  5.9  4.9   Hemoglobin 12.0 - 15.0 g/dL 11.6  13.1  12.8   Hematocrit 36.0 - 46.0 % 35.4  39.4  40.7   Platelets 150 - 400 K/uL 197  196  155        Latest Ref Rng & Units 03/30/2022    7:49 AM 03/25/2022    9:58 AM 03/22/2022   12:30 PM  CMP  Glucose 70 - 99 mg/dL 101  110  88   BUN 6 - 20 mg/dL 11  9  11   $ Creatinine 0.44 - 1.00 mg/dL 0.75  0.72  0.75   Sodium 135 - 145 mmol/L 139  140  138   Potassium 3.5 - 5.1 mmol/L 4.0  3.9  3.7   Chloride 98 - 111 mmol/L 106  103  104   CO2 22 - 32 mmol/L 28  31  24   $ Calcium 8.9 - 10.3 mg/dL 9.0  10.2  9.3   Total Protein 6.5 - 8.1 g/dL 7.2  7.9  8.3   Total Bilirubin 0.3 - 1.2 mg/dL 0.5  0.5  0.7   Alkaline Phos 38 - 126 U/L 73  76  64   AST 15 - 41 U/L 27  35  32   ALT 0 - 44 U/L 24  28  24      $ RADIOGRAPHIC STUDIES: I have personally reviewed the radiological images as listed and agreed with the findings in the report. IR IMAGING GUIDED PORT INSERTION  Result Date: 03/22/2022 INDICATION: 57 year old female referred for port catheter and liver mass biopsy, with suspected pancreatic metastases EXAM: IMAGE GUIDED PORT CATHETER IMAGE GUIDED LIVER MASS BIOPSY MEDICATIONS: None ANESTHESIA/SEDATION: Moderate (conscious) sedation was employed during this procedure. A total of Versed 3.0 mg and Fentanyl 100 mcg was administered intravenously. Moderate Sedation Time: 30 minutes. The patient's level of consciousness and vital signs were monitored continuously by radiology nursing throughout the procedure under my direct supervision. FLUOROSCOPY TIME:  Fluoroscopy Time:  (0 mGy). COMPLICATIONS: None PROCEDURE: Informed written consent was obtained from the patient after a thorough discussion of the procedural risks, benefits and alternatives. All  questions were addressed. Maximal Sterile Barrier Technique was utilized including caps, mask, sterile gowns, sterile gloves, sterile drape, hand hygiene and skin antiseptic. A timeout was performed prior to the initiation of the procedure. Ultrasound survey was performed with images stored and sent to PACs. Right IJ vein documented to be patent. The right neck and chest was prepped with chlorhexidine, and draped in the usual sterile fashion using maximum barrier technique (cap and mask, sterile gown, sterile gloves, large sterile sheet, hand hygiene and cutaneous antiseptic). Local anesthesia was attained by infiltration with 1% lidocaine without epinephrine. Ultrasound demonstrated patency of the right internal jugular vein, and this was documented with an image. Under real-time ultrasound guidance, this vein was accessed with a 21 gauge micropuncture needle and image documentation was performed. A small dermatotomy was made at the access site with an 11 scalpel. A 0.018" wire was advanced into the SVC and used to estimate the length of the internal catheter. The access needle exchanged for a 33F micropuncture vascular sheath. The 0.018" wire was then removed  and a 0.035" wire advanced into the IVC. An appropriate location for the subcutaneous reservoir was selected below the clavicle and an incision was made through the skin and underlying soft tissues. The subcutaneous tissues were then dissected using a combination of blunt and sharp surgical technique and a pocket was formed. A single lumen power injectable portacatheter was then tunneled through the subcutaneous tissues from the pocket to the dermatotomy and the port reservoir placed within the subcutaneous pocket. Given that the port catheter was placed in a region near by a prior port catheter location, anchor suture was performed. The venous access site was then serially dilated and a peel away vascular sheath placed over the wire. The wire was removed and  the port catheter advanced into position under fluoroscopic guidance. The catheter tip is positioned in the cavoatrial junction. This was documented with a spot image. The portacatheter was then tested and found to flush and aspirate well. The port was flushed with saline followed by 100 units/mL heparinized saline. The pocket was then closed in two layers using first subdermal inverted interrupted absorbable sutures followed by a running subcuticular suture. The epidermis was then sealed with Dermabond. The dermatotomy at the venous access site was also seal with Dermabond. We then proceeded with image guided liver mass biopsy. The right lower thorax/right upper abdomen was prepped with chlorhexidine in a sterile fashion, and a sterile drape was applied covering the operative field. A sterile gown and sterile gloves were used for the procedure. Local anesthesia was provided with 1% Lidocaine. Ultrasound survey of the right liver lobe performed with images stored and sent to PACs. The patient was prepped and draped sterilely and the skin and subcutaneous tissues were generously infiltrated with 1% lidocaine. A 17 gauge introducer needle was then advanced under ultrasound guidance in a subcostal location into the left liver lobe. The stylet was removed, and multiple separate 18 gauge core biopsy were retrieved. Samples were placed into formalin for transportation to the lab. Gel-Foam pledgets were then infused with a small amount of saline for assistance with hemostasis. The needle was removed, and a final ultrasound image was performed. The patient tolerated the procedure well and remained hemodynamically stable throughout. No complications were encountered and no significant blood loss was encounter. IMPRESSION: Status post image guided right IJ port catheter placement, and ultrasound-guided liver mass biopsy. Signed, Dulcy Fanny. Nadene Rubins, RPVI [facility]  Radiology Electronically Signed   By: Corrie Mckusick D.O.   On: 03/22/2022 14:41   IR US LIVER BIOPSY  Result Date: 03/22/2022 INDICATION: 57 year old female referred for port catheter and liver mass biopsy, with suspected pancreatic metastases EXAM: IMAGE GUIDED PORT CATHETER IMAGE GUIDED LIVER MASS BIOPSY MEDICATIONS: None ANESTHESIA/SEDATION: Moderate (conscious) sedation was employed during this procedure. A total of Versed 3.0 mg and Fentanyl 100 mcg was administered intravenously. Moderate Sedation Time: 30 minutes. The patient's level of consciousness and vital signs were monitored continuously by radiology nursing throughout the procedure under my direct supervision. FLUOROSCOPY TIME:  Fluoroscopy Time:  (0 mGy). COMPLICATIONS: None PROCEDURE: Informed written consent was obtained from the patient after a thorough discussion of the procedural risks, benefits and alternatives. All questions were addressed. Maximal Sterile Barrier Technique was utilized including caps, mask, sterile gowns, sterile gloves, sterile drape, hand hygiene and skin antiseptic. A timeout was performed prior to the initiation of the procedure. Ultrasound survey was performed with images stored and sent to PACs. Right IJ vein documented to  be patent. The right neck and chest was prepped with chlorhexidine, and draped in the usual sterile fashion using maximum barrier technique (cap and mask, sterile gown, sterile gloves, large sterile sheet, hand hygiene and cutaneous antiseptic). Local anesthesia was attained by infiltration with 1% lidocaine without epinephrine. Ultrasound demonstrated patency of the right internal jugular vein, and this was documented with an image. Under real-time ultrasound guidance, this vein was accessed with a 21 gauge micropuncture needle and image documentation was performed. A small dermatotomy was made at the access site with an 11 scalpel. A 0.018" wire was advanced into the SVC and used to estimate the  length of the internal catheter. The access needle exchanged for a 83F micropuncture vascular sheath. The 0.018" wire was then removed and a 0.035" wire advanced into the IVC. An appropriate location for the subcutaneous reservoir was selected below the clavicle and an incision was made through the skin and underlying soft tissues. The subcutaneous tissues were then dissected using a combination of blunt and sharp surgical technique and a pocket was formed. A single lumen power injectable portacatheter was then tunneled through the subcutaneous tissues from the pocket to the dermatotomy and the port reservoir placed within the subcutaneous pocket. Given that the port catheter was placed in a region near by a prior port catheter location, anchor suture was performed. The venous access site was then serially dilated and a peel away vascular sheath placed over the wire. The wire was removed and the port catheter advanced into position under fluoroscopic guidance. The catheter tip is positioned in the cavoatrial junction. This was documented with a spot image. The portacatheter was then tested and found to flush and aspirate well. The port was flushed with saline followed by 100 units/mL heparinized saline. The pocket was then closed in two layers using first subdermal inverted interrupted absorbable sutures followed by a running subcuticular suture. The epidermis was then sealed with Dermabond. The dermatotomy at the venous access site was also seal with Dermabond. We then proceeded with image guided liver mass biopsy. The right lower thorax/right upper abdomen was prepped with chlorhexidine in a sterile fashion, and a sterile drape was applied covering the operative field. A sterile gown and sterile gloves were used for the procedure. Local anesthesia was provided with 1% Lidocaine. Ultrasound survey of the right liver lobe performed with images stored and sent to PACs. The patient was prepped and draped sterilely and  the skin and subcutaneous tissues were generously infiltrated with 1% lidocaine. A 17 gauge introducer needle was then advanced under ultrasound guidance in a subcostal location into the left liver lobe. The stylet was removed, and multiple separate 18 gauge core biopsy were retrieved. Samples were placed into formalin for transportation to the lab. Gel-Foam pledgets were then infused with a small amount of saline for assistance with hemostasis. The needle was removed, and a final ultrasound image was performed. The patient tolerated the procedure well and remained hemodynamically stable throughout. No complications were encountered and no significant blood loss was encounter. IMPRESSION: Status post image guided right IJ port catheter placement, and ultrasound-guided liver mass biopsy. Signed, Dulcy Fanny. Nadene Rubins, RPVI [facility] Radiology Electronically Signed   By: Corrie Mckusick D.O.   On: 03/22/2022 14:41   CT Chest W Contrast  Result Date: 03/18/2022 CLINICAL DATA:  57 year old female with history of pancreatic cancer. Staging examination. * Tracking Code: BO * EXAM: CT CHEST WITH CONTRAST TECHNIQUE: Multidetector CT imaging  of the chest was performed during intravenous contrast administration. RADIATION DOSE REDUCTION: This exam was performed according to the departmental dose-optimization program which includes automated exposure control, adjustment of the mA and/or kV according to patient size and/or use of iterative reconstruction technique. CONTRAST:  33m OMNIPAQUE IOHEXOL 300 MG/ML  SOLN COMPARISON:  None Available. FINDINGS: Cardiovascular: Heart size is normal. There is no significant pericardial fluid, thickening or pericardial calcification. Aortic atherosclerosis. No definite coronary artery calcifications. Mediastinum/Nodes: No pathologically enlarged mediastinal or hilar lymph nodes. Esophagus is unremarkable in appearance. No axillary  lymphadenopathy. Lungs/Pleura: 3 mm left lower lobe pulmonary nodule (axial image 93 of series 5), nonspecific. No other larger more suspicious appearing pulmonary nodules or masses are noted. No acute consolidative airspace disease. No pleural effusions. Upper Abdomen: Numerous hypovascular hepatic lesions scattered throughout the visualized liver better demonstrated on recent abdominal MRI 03/10/2022, compatible with metastatic disease, with the largest of these in segment 4A on today's examination measuring at least 2.4 x 2.4 cm, similar to the recent prior study. Musculoskeletal: There are no aggressive appearing lytic or blastic lesions noted in the visualized portions of the skeleton. IMPRESSION: 1. No definitive findings to suggest metastatic disease to the thorax. 2. 3 mm left lower lobe pulmonary nodule, nonspecific, but statistically likely benign. Continued attention on routine follow-up imaging is recommended to exclude the possibility of a metastatic lesion. 3. Aortic atherosclerosis. 4. Widespread metastatic disease to the liver better demonstrated on recent abdominal MRI 03/10/2022, please see that report for full description. Aortic Atherosclerosis (ICD10-I70.0). Electronically Signed   By: DVinnie LangtonM.D.   On: 03/18/2022 08:50   MR ABDOMEN WWO CONTRAST  Result Date: 03/10/2022 CLINICAL DATA:  Hepatic lesions on sonography for further characterization. Abdominal pain extending to the flanks, dietary alterations have not relieve the patient's symptoms. EXAM: MRI ABDOMEN WITHOUT AND WITH CONTRAST TECHNIQUE: Multiplanar multisequence MR imaging of the abdomen was performed both before and after the administration of intravenous contrast. CONTRAST:  658mGADAVIST GADOBUTROL 1 MMOL/ML IV SOLN COMPARISON:  Abdominal ultrasound 03/08/2022 FINDINGS: Lower chest: Unremarkable Hepatobiliary: Numerous targetoid enhancing masses are scattered in all segments of the liver. Many of these have central  hypoenhancing components indicating central necrosis. Index segment 4a lesion 2.3 by 2.5 cm on image 32 series 801. No thrombosis of and intrahepatic portal vein although the portal vein is severely narrowed and either occluded or nearly occluded by the primary pancreatic head mass. No biliary dilatation. Fluid-fluid level in the gallbladder likely from sludge. Pancreas: Hypoenhancing mass within the inferior portion of the pancreatic head, high suspicion for pancreatic adenocarcinoma, with following characteristics: Size: 4.8 by 2.9 by 2.2 cm (image 81, series 801) Location: Head and uncinate process Characterization: Solid Enhancement: Hypoenhancement particularly on arterial phase images, ill-defined infiltrative margins Other Characteristics: N/A Local extent of mass: The mass substantially abuts the proximal transverse duodenum, although overt duodenal invasion is indeterminate. Vascular Involvement: The mass severely effaces and nearly occludes the portal vein, with substantial collateral vessels along the upper anterior omentum. The splenic vein is severely narrowed and nearly occluded. The SMV is occluded. The mass substantially extends around the superior mesenteric artery as on image 78 series 804. Mass effect from the lesion flattens the IVC locally as on image 80 of series 804. Variant hepatic artery anatomy: Present, the celiac trunk appears to supply the left hepatic lobe but a branch from the SMA provides systemic arterial supply to the right hepatic lobe. Bile Duct Involvement: The common bile duct  extends through the region of the mass and is severely effaced in this vicinity, but there is no current biliary dilatation. No dorsal pancreatic duct dilatation Variant biliary anatomy: Not observed Adjacent Nodes: Left periaortic Omental/Peritoneal Disease: Absent Distant Metastases: Liver Mild stranding in the mesentery around the pancreatic head. Spleen:  Unremarkable Adrenals/Urinary Tract:   Unremarkable Stomach/Bowel: Prominent stool throughout the colon favors constipation. Vascular/Lymphatic: Vascular involvement related to the pancreatic mass is as discussed above. Stranding in the retroperitoneum surrounding clustered mildly enlarged left periaortic lymph nodes, measuring up to about 1.2 cm on image 27 series 4. Questionable filling defect proximally in the left ovarian vein, cannot exclude a small amount of left ovarian vein thrombosis for example on image 79 series 803. Other:  No supplemental non-categorized findings. Musculoskeletal: Unremarkable IMPRESSION: 1. 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 The mass severely effaces and nearly occludes the portal vein and splenic vein, and occludes the superior mesenteric vein with some collaterals noted. The mass substantially abuts the proximal transverse duodenum, although overt duodenal invasion is indeterminate. The mass also extends around the superior mesenteric artery and encases the distal CBD although currently no biliary dilatation is observed. 2. Potential partial thrombosis of the proximal left ovarian vein. 3. Variant hepatic artery anatomy is present, the celiac trunk appears to supply the left hepatic lobe but a branch from the SMA provides systemic arterial supply to the right hepatic lobe. 4. Prominent stool throughout the colon favors constipation. 5. Fluid-fluid level in the gallbladder likely from sludge. These results were called by telephone at the time of interpretation on 03/10/2022 at 1:10 pm to provider Dr. Ronnette Juniper , who verbally acknowledged these results. Electronically Signed   By: Van Clines M.D.   On: 03/10/2022 13:27   US Abdomen Limited RUQ (LIVER/GB)  Result Date: 03/08/2022 CLINICAL DATA:  Right upper quadrant pain.  Nausea. EXAM: ULTRASOUND ABDOMEN LIMITED RIGHT UPPER  QUADRANT COMPARISON:  CT abdomen pelvis 11/25/2011 FINDINGS: Gallbladder: No gallstones or wall thickening visualized. No sonographic Murphy sign noted by sonographer. Common bile duct: Diameter: 2.4 mm Liver: Multiple hypoechoic masses are demonstrated within the liver. Within the left hepatic lobe there is a 1.6 x 1.2 x 0.9 cm hypoechoic mass. Within the right hepatic lobe there is a 1.2 x 1.0 x 1.1 cm hypoechoic mass. Increased hepatic parenchymal echogenicity. Portal vein is patent on color Doppler imaging with normal direction of blood flow towards the liver. Other: None. IMPRESSION: 1. No cholelithiasis or sonographic evidence for acute cholecystitis. 2. Multiple hypoechoic masses within the liver are indeterminate. Possibility of primary hepatic process or malignant process not excluded. Recommend dedicated evaluation with pre and post contrast-enhanced abdominal MRI. 3. Hepatic steatosis. 4. These results will be called to the ordering clinician or representative by the Radiologist Assistant, and communication documented in the PACS or [employer]. Electronically Signed   By: Lovey Newcomer M.D.   On: 03/08/2022 10:17    ASSESSMENT & PLAN Cayse Willock is a 57 y.o. female who returns to the clinic for newly diagnosed pancreatic adenocarcinoma.    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 Dr. Lorenso Courier recommends chemotherapy regimen with FOLFIRINOX q 2 weeks.  --Plan to obtain restaging CT scans every 3 months  to assess treatment response.  --Sent tumor for NGS testing. Awaiting results. PLAN: --Due to start Cycle 1, Day 1 of FOLFIRINX today --Labs  from today reviewed and adequate for treatment. WBC 4.6, Hgb 11.6, Plt 197, Creatinine and LFTs  normal --Proceed with treatment today without any dose modifications --RTC in 2 weeks with labs and follow up before Cycle 2, Day 1.    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  --Currently on Norco 5-325 mg with moderate relief.   #Constipation: --Advised to be aggressive with bowel regimen including miralax and take senokot daily  #Appetite loss/weight loss: --Advised to supplement with protein shakes. --Sent referral to [facility] nutrition team  #Family history of breast cancer: --Due to strong family history of cancer and has undergone genetic testing in the past. With the anticipated diagnosis of pancreatic cancer, we will make a referral to genetics to see if additional testing is required.   #Supportive Care -- chemotherapy education to be scheduled  -- port placement complete  -- zofran 32m q8H PRN for nausea, zyprexa nightly PRN for nausea. Has allergy to compazine and phernegan.  -- EMLA cream for port   Orders Placed This Encounter  Procedures   CBC with Differential ([place])    Standing Status:   Future    Standing Expiration Date:   04/14/2023   CMP ([place])    Standing Status:   Future    Standing Expiration Date:   04/14/2023   CBC with Differential ([place])    Standing Status:   Future    Standing Expiration Date:   04/28/2023   CMP ([place])    Standing Status:   Future    Standing Expiration Date:   04/28/2023    All questions were answered. The patient knows to call the clinic with any problems, questions or concerns.  I have spent a total of 30 minutes minutes of face-to-face and non-face-to-face time, preparing to see the patient,  performing a medically appropriate examination, counseling and educating the patient,  documenting clinical information in the electronic health record,  and care coordination.   IDede Query PA-C Department of Hematology/Oncology [facility]Phone: [phone]

## 2022-03-31 ENCOUNTER — Telehealth: Payer: Self-pay | Admitting: *Deleted

## 2022-03-31 ENCOUNTER — Other Ambulatory Visit: Payer: Self-pay

## 2022-03-31 NOTE — Progress Notes (Signed)
Fax received from [facility] stating  a prior authorization for testing must be initiated from our office.  I spoke with Vin at [facility].  Telephone call reference number [id number].  Prior authorization is not needed for the next generation sequencing testing.  I have faxed this information to [facility] medicine.  I faxed to 2 different fax numbers [phone] and [phone].  Successful transmission receipt for both numbers received.

## 2022-04-01 ENCOUNTER — Inpatient Hospital Stay: Payer: BC Managed Care – PPO

## 2022-04-01 ENCOUNTER — Telehealth: Payer: Self-pay | Admitting: Genetic Counselor

## 2022-04-01 ENCOUNTER — Encounter: Payer: Self-pay | Admitting: Genetic Counselor

## 2022-04-01 VITALS — BP 137/70 | HR 56 | Temp 99.2°F | Resp 16

## 2022-04-01 DIAGNOSIS — C787 Secondary malignant neoplasm of liver and intrahepatic bile duct: Secondary | ICD-10-CM | POA: Diagnosis not present

## 2022-04-01 DIAGNOSIS — Z7901 Long term (current) use of anticoagulants: Secondary | ICD-10-CM | POA: Diagnosis not present

## 2022-04-01 DIAGNOSIS — Z452 Encounter for adjustment and management of vascular access device: Secondary | ICD-10-CM | POA: Diagnosis not present

## 2022-04-01 DIAGNOSIS — C250 Malignant neoplasm of head of pancreas: Secondary | ICD-10-CM | POA: Diagnosis not present

## 2022-04-01 DIAGNOSIS — Z5111 Encounter for antineoplastic chemotherapy: Secondary | ICD-10-CM | POA: Diagnosis not present

## 2022-04-01 DIAGNOSIS — I81 Portal vein thrombosis: Secondary | ICD-10-CM | POA: Diagnosis not present

## 2022-04-01 DIAGNOSIS — Z95828 Presence of other vascular implants and grafts: Secondary | ICD-10-CM | POA: Insufficient documentation

## 2022-04-01 DIAGNOSIS — Z1379 Encounter for other screening for genetic and chromosomal anomalies: Secondary | ICD-10-CM | POA: Insufficient documentation

## 2022-04-01 DIAGNOSIS — Z803 Family history of malignant neoplasm of breast: Secondary | ICD-10-CM | POA: Diagnosis not present

## 2022-04-01 DIAGNOSIS — R634 Abnormal weight loss: Secondary | ICD-10-CM | POA: Diagnosis not present

## 2022-04-01 DIAGNOSIS — K5900 Constipation, unspecified: Secondary | ICD-10-CM | POA: Diagnosis not present

## 2022-04-01 DIAGNOSIS — R630 Anorexia: Secondary | ICD-10-CM | POA: Diagnosis not present

## 2022-04-01 DIAGNOSIS — C259 Malignant neoplasm of pancreas, unspecified: Secondary | ICD-10-CM

## 2022-04-01 MED ORDER — SODIUM CHLORIDE 0.9% FLUSH
10.0000 mL | Freq: Once | INTRAVENOUS | Status: AC
  Administered 2022-04-01: 10 mL

## 2022-04-01 MED ORDER — HEPARIN SOD (PORK) LOCK FLUSH 100 UNIT/ML IV SOLN
500.0000 [IU] | Freq: Once | INTRAVENOUS | Status: AC
  Administered 2022-04-01: 500 [IU]

## 2022-04-01 NOTE — Telephone Encounter (Signed)
Negative genetic testing on the BRCAPlus panel.  The remainder of testing will be called out in a week or two.

## 2022-04-03 ENCOUNTER — Encounter: Payer: Self-pay | Admitting: Hematology and Oncology

## 2022-04-04 ENCOUNTER — Encounter (HOSPITAL_COMMUNITY): Payer: Self-pay | Admitting: Hematology and Oncology

## 2022-04-04 DIAGNOSIS — C259 Malignant neoplasm of pancreas, unspecified: Secondary | ICD-10-CM | POA: Diagnosis not present

## 2022-04-08 ENCOUNTER — Encounter: Payer: Self-pay | Admitting: Physician Assistant

## 2022-04-09 ENCOUNTER — Emergency Department (HOSPITAL_COMMUNITY)
Admission: EM | Admit: 2022-04-09 | Discharge: 2022-04-09 | Disposition: A | Discharge status: Home / Self Care | Payer: BC Managed Care – PPO | Attending: Emergency Medicine | Admitting: Emergency Medicine

## 2022-04-09 ENCOUNTER — Encounter (HOSPITAL_COMMUNITY): Payer: Self-pay

## 2022-04-09 DIAGNOSIS — R112 Nausea with vomiting, unspecified: Secondary | ICD-10-CM | POA: Diagnosis not present

## 2022-04-09 DIAGNOSIS — R197 Diarrhea, unspecified: Secondary | ICD-10-CM

## 2022-04-09 DIAGNOSIS — Z79899 Other long term (current) drug therapy: Secondary | ICD-10-CM | POA: Insufficient documentation

## 2022-04-09 DIAGNOSIS — Z7901 Long term (current) use of anticoagulants: Secondary | ICD-10-CM | POA: Diagnosis not present

## 2022-04-09 LAB — CBC WITH DIFFERENTIAL/PLATELET
Abs Immature Granulocytes: 0 10*3/uL (ref 0.00–0.07)
Basophils Absolute: 0 10*3/uL (ref 0.0–0.1)
Basophils Relative: 0 %
Eosinophils Absolute: 0 10*3/uL (ref 0.0–0.5)
Eosinophils Relative: 2 %
HCT: 38.9 % (ref 36.0–46.0)
Hemoglobin: 12.4 g/dL (ref 12.0–15.0)
Immature Granulocytes: 0 %
Lymphocytes Relative: 42 %
Lymphs Abs: 0.8 10*3/uL (ref 0.7–4.0)
MCH: 27.3 pg (ref 26.0–34.0)
MCHC: 31.9 g/dL (ref 30.0–36.0)
MCV: 85.7 fL (ref 80.0–100.0)
Monocytes Absolute: 0.2 10*3/uL (ref 0.1–1.0)
Monocytes Relative: 8 %
Neutro Abs: 1 10*3/uL — ABNORMAL LOW (ref 1.7–7.7)
Neutrophils Relative %: 48 %
Platelets: 101 10*3/uL — ABNORMAL LOW (ref 150–400)
RBC: 4.54 MIL/uL (ref 3.87–5.11)
RDW: 12.3 % (ref 11.5–15.5)
WBC: 2 10*3/uL — ABNORMAL LOW (ref 4.0–10.5)
nRBC: 0 % (ref 0.0–0.2)

## 2022-04-09 LAB — URINALYSIS, ROUTINE W REFLEX MICROSCOPIC
Bilirubin Urine: NEGATIVE
Glucose, UA: NEGATIVE mg/dL
Hgb urine dipstick: NEGATIVE
Ketones, ur: 80 mg/dL — AB
Leukocytes,Ua: NEGATIVE
Nitrite: NEGATIVE
Protein, ur: NEGATIVE mg/dL
Specific Gravity, Urine: 1.025 (ref 1.005–1.030)
pH: 5 (ref 5.0–8.0)

## 2022-04-09 LAB — COMPREHENSIVE METABOLIC PANEL
ALT: 24 U/L (ref 0–44)
AST: 22 U/L (ref 15–41)
Albumin: 4.3 g/dL (ref 3.5–5.0)
Alkaline Phosphatase: 81 U/L (ref 38–126)
Anion gap: 9 (ref 5–15)
BUN: 12 mg/dL (ref 6–20)
CO2: 26 mmol/L (ref 22–32)
Calcium: 9.3 mg/dL (ref 8.9–10.3)
Chloride: 102 mmol/L (ref 98–111)
Creatinine, Ser: 0.76 mg/dL (ref 0.44–1.00)
GFR, Estimated: >60 mL/min (ref >60)
Glucose, Bld: 100 mg/dL — ABNORMAL HIGH (ref 70–99)
Potassium: 3.5 mmol/L (ref 3.5–5.1)
Sodium: 137 mmol/L (ref 135–145)
Total Bilirubin: 0.5 mg/dL (ref 0.3–1.2)
Total Protein: 8.2 g/dL — ABNORMAL HIGH (ref 6.5–8.1)

## 2022-04-09 LAB — LIPASE, BLOOD: Lipase: 37 U/L (ref 11–51)

## 2022-04-09 MED ORDER — SODIUM CHLORIDE 0.9 % IV BOLUS
1000.0000 mL | Freq: Once | INTRAVENOUS | Status: AC
  Administered 2022-04-09: 1000 mL via INTRAVENOUS

## 2022-04-09 MED ORDER — MORPHINE SULFATE (PF) 4 MG/ML IV SOLN
4.0000 mg | Freq: Once | INTRAVENOUS | Status: AC
  Administered 2022-04-09: 4 mg via INTRAVENOUS
  Filled 2022-04-09: qty 1

## 2022-04-09 MED ORDER — MORPHINE SULFATE ER 15 MG PO TBCR
15.0000 mg | EXTENDED_RELEASE_TABLET | Freq: Once | ORAL | Status: AC
  Administered 2022-04-09: 15 mg via ORAL
  Filled 2022-04-09: qty 1

## 2022-04-09 MED ORDER — ONDANSETRON HCL 4 MG/2ML IJ SOLN
4.0000 mg | Freq: Once | INTRAMUSCULAR | Status: AC
  Administered 2022-04-09: 4 mg via INTRAVENOUS
  Filled 2022-04-09: qty 2

## 2022-04-09 NOTE — ED Provider Notes (Signed)
[place] EMERGENCY DEPARTMENT AT [facility] Provider Note   CSN: [id number] Arrival date & time: 04/09/22  1455     History  Chief Complaint  Patient presents with   Diarrhea    Kaitlin Ferrell is a 57 y.o. female.  57 year old female with prior medical history as detailed below presents for evaluation.  Patient is complaining of persistent nausea, vomiting, diarrhea associated with chemo treatments.  Patient reports that with use of home antiemetic therapies her symptoms are not controlled.  She is requesting IV fluids.  She denies recent fever or change in symptoms.  The history is provided by the patient and medical records.       Home Medications Prior to Admission medications   Medication Sig Start Date End Date Taking? Authorizing Provider  acetaminophen (TYLENOL) 325 MG tablet Take 650 mg by mouth every 6 (six) hours as needed for headache (if relpax does not help).    [provider]  amLODipine (NORVASC) 10 MG tablet Take 1 tablet by mouth daily. 12/08/21   [provider]  APIXABAN Arne Cleveland) VTE STARTER PACK ('10MG'$  AND '5MG'$ ) Take as directed on package: start with two-'5mg'$  tablets twice daily for 7 days. On day 8, switch to one-'5mg'$  tablet twice daily. Start after liver biopsy and port placement. 03/14/22   Lincoln Brigham, PA-C  eletriptan (RELPAX) 40 MG tablet Take 1 tablet (40 mg total) by mouth as needed for migraine or headache. May repeat in 2 hours if needed. 09/22/14   Dennie Bible, NP  HYDROcodone-acetaminophen (NORCO) 5-325 MG tablet Take 1 tablet by mouth every 6 (six) hours as needed for moderate pain. 03/25/22   Lincoln Brigham, PA-C  ibuprofen (ADVIL,MOTRIN) 200 MG tablet Take 400 mg by mouth every 6 (six) hours as needed.    [provider]  lidocaine-prilocaine (EMLA) cream Apply 1 Application topically as needed. 03/14/22   Dede Query T, PA-C  magnesium oxide (MAG-OX) 400 (241.3 MG) MG tablet TAKE 1 TABLET BY  MOUTH TWICE A DAY 11/16/14   Dennie Bible, NP  morphine (MS CONTIN) 15 MG 12 hr tablet Take 1 tablet (15 mg total) by mouth every 12 (twelve) hours. 03/30/22   Lincoln Brigham, PA-C  OLANZapine (ZYPREXA) 10 MG tablet Take 1 tablet (10 mg total) by mouth at bedtime. 03/29/22   Dede Query T, PA-C  ondansetron (ZOFRAN) 8 MG tablet Take 1 tablet (8 mg total) by mouth every 8 (eight) hours as needed for nausea or vomiting. 03/14/22   Lincoln Brigham, PA-C  Riboflavin (B2) 100 MG TABS Take 1 tablet by mouth 2 (two) times daily. 02/05/14   [provider]      Allergies    Compazine [prochlorperazine edisylate], Propofol, Oxycodone-acetaminophen, Prochlorperazine, and Labetalol    Review of Systems   Review of Systems  All other systems reviewed and are negative.   Physical Exam Updated Vital Signs BP 128/79   Pulse 75   Temp 98 F (36.7 C) (Oral)   Resp 18   LMP 06/26/2013 Comment: low dose BCP  SpO2 100%  Physical Exam Vitals and nursing note reviewed.  Constitutional:      General: She is not in acute distress.    Appearance: Normal appearance. She is well-developed.  HENT:     Head: Normocephalic and atraumatic.     Mouth/Throat:     Mouth: Mucous membranes are dry.  Eyes:     Conjunctiva/sclera: Conjunctivae normal.     Pupils:  Pupils are equal, round, and reactive to light.  Cardiovascular:     Rate and Rhythm: Normal rate and regular rhythm.     Heart sounds: Normal heart sounds.  Pulmonary:     Effort: Pulmonary effort is normal. No respiratory distress.     Breath sounds: Normal breath sounds.  Abdominal:     General: There is no distension.     Palpations: Abdomen is soft.     Tenderness: There is no abdominal tenderness.  Musculoskeletal:        General: No deformity. Normal range of motion.     Cervical back: Normal range of motion and neck supple.  Skin:    General: Skin is warm and dry.  Neurological:     General: No focal deficit present.      Mental Status: She is alert and oriented to person, place, and time.     ED Results / Procedures / Treatments   Labs (all labs ordered are listed, but only abnormal results are displayed) Labs Reviewed  CBC WITH DIFFERENTIAL/PLATELET - Abnormal; Notable for the following components:      Result Value   WBC 2.0 (*)    Platelets 101 (*)    Neutro Abs 1.0 (*)    All other components within normal limits  COMPREHENSIVE METABOLIC PANEL - Abnormal; Notable for the following components:   Glucose, Bld 100 (*)    Total Protein 8.2 (*)    All other components within normal limits  URINALYSIS, ROUTINE W REFLEX MICROSCOPIC - Abnormal; Notable for the following components:   Ketones, ur 80 (*)    All other components within normal limits  LIPASE, BLOOD    EKG None  Radiology No results found.  Procedures Procedures    Medications Ordered in ED Medications  sodium chloride 0.9 % bolus 1,000 mL (has no administration in time range)  sodium chloride 0.9 % bolus 1,000 mL (0 mLs Intravenous Stopped 04/09/22 1746)  ondansetron (ZOFRAN) injection 4 mg (4 mg Intravenous Given 04/09/22 1601)  morphine (PF) 4 MG/ML injection 4 mg (4 mg Intravenous Given 04/09/22 1600)    ED Course/ Medical Decision Making/ A&P                             Medical Decision Making Amount and/or Complexity of Data Reviewed Labs: ordered.  Risk Prescription drug management.    Medical Screen Complete  This patient presented to the ED with complaint of nausea, vomiting, diarrhea.  This complaint involves an extensive number of treatment options. The initial differential diagnosis includes, but is not limited to, metabolic abnormality, dehydration, etc.  This presentation is: Acute, Chronic, Self-Limited, Previously Undiagnosed, Uncertain Prognosis, Complicated, Systemic Symptoms, and Threat to Life/Bodily Function  Patient complains of nausea, vomiting, diarrhea associated with chemotherapy  treatment.  Patient reports at home antiemetics are ineffective for controlling her symptoms.  Screening labs obtained are without significant abnormality.  Patient feels significantly improved with IV fluids.  She declines additional IV fluids and/or overnight observation/admission.  She does understand need for close outpatient follow-up.  Strict return precautions given and understood.  Additional history obtained:  External records from outside sources obtained and reviewed including prior ED visits and prior Inpatient records.    Lab Tests:  I ordered and personally interpreted labs.  The pertinent results include: CBC, CMP lipase ua  Cardiac Monitoring:  The patient was maintained on a cardiac monitor.  I personally viewed and  interpreted the cardiac monitor which showed an underlying rhythm of: NSR   Medicines ordered:  I ordered medication including IVF zofran morphine ms contin  for dehydration pain  Reevaluation of the patient after these medicines showed that the patient: improved  Problem List / ED Course:  Dehydration   Reevaluation:  After the interventions noted above, I reevaluated the patient and found that they have: improved  Disposition:  After consideration of the diagnostic results and the patients response to treatment, I feel that the patent would benefit from close outpatient followup.          Final Clinical Impression(s) / ED Diagnoses Final diagnoses:  Diarrhea, unspecified type    Rx / DC Orders ED Discharge Orders     None         Valarie Merino, MD 04/09/22 2010

## 2022-04-09 NOTE — Discharge Instructions (Addendum)
Return for any problem.  ?

## 2022-04-09 NOTE — ED Triage Notes (Addendum)
Pt arrived via POV, c/o diarrhea and abd pain since last chemo tx friday

## 2022-04-11 ENCOUNTER — Telehealth: Payer: Self-pay

## 2022-04-11 MED ORDER — DIPHENOXYLATE-ATROPINE 2.5-0.025 MG PO TABS: 1.0000 | ORAL_TABLET | Freq: Four times a day (QID) | ORAL | 0 refills | Status: DC | PRN

## 2022-04-11 NOTE — Addendum Note (Signed)
Addended by: Dede Query T on: 04/11/2022 03:47 PM   Modules accepted: Orders

## 2022-04-11 NOTE — Telephone Encounter (Signed)
Spoke with patient upon notification of ED visit on 04/09/22 for fluids related to diarrhea and nausea. Patient states day 4 after first infusion she began to have diarrhea and nausea. Patient states Zyprexa and Zofran did not help. Patient took liquid Imodium which she was not able to keep down. IV fluids in the ED helped. Symptoms resolved at present.   Patient's next MD and infusion apt is on 04/14/22.  Message routed to Dede Query PA for review of information.

## 2022-04-13 MED FILL — Dexamethasone Sodium Phosphate Inj 100 MG/10ML: INTRAMUSCULAR | Qty: 1 | Status: AC

## 2022-04-13 MED FILL — Fosaprepitant Dimeglumine For IV Infusion 150 MG (Base Eq): INTRAVENOUS | Qty: 5 | Status: AC

## 2022-04-14 ENCOUNTER — Other Ambulatory Visit: Payer: Self-pay

## 2022-04-14 ENCOUNTER — Other Ambulatory Visit: Payer: Self-pay | Admitting: Physician Assistant

## 2022-04-14 ENCOUNTER — Ambulatory Visit (HOSPITAL_COMMUNITY)
Admission: RE | Admit: 2022-04-14 | Discharge: 2022-04-14 | Disposition: A | Discharge status: Home / Self Care | Payer: BC Managed Care – PPO | Source: Ambulatory Visit | Attending: Physician Assistant | Admitting: Physician Assistant

## 2022-04-14 ENCOUNTER — Inpatient Hospital Stay (HOSPITAL_BASED_OUTPATIENT_CLINIC_OR_DEPARTMENT_OTHER): Payer: BC Managed Care – PPO | Admitting: Physician Assistant

## 2022-04-14 ENCOUNTER — Inpatient Hospital Stay: Payer: BC Managed Care – PPO | Attending: Physician Assistant

## 2022-04-14 ENCOUNTER — Encounter: Payer: Self-pay | Admitting: Physician Assistant

## 2022-04-14 ENCOUNTER — Inpatient Hospital Stay: Payer: BC Managed Care – PPO

## 2022-04-14 ENCOUNTER — Telehealth: Payer: Self-pay | Admitting: Hematology and Oncology

## 2022-04-14 VITALS — BP 135/75 | HR 96 | Temp 98.5°F | Resp 14 | Wt 154.3 lb

## 2022-04-14 DIAGNOSIS — Z95828 Presence of other vascular implants and grafts: Secondary | ICD-10-CM

## 2022-04-14 DIAGNOSIS — D709 Neutropenia, unspecified: Secondary | ICD-10-CM | POA: Diagnosis not present

## 2022-04-14 DIAGNOSIS — Z5189 Encounter for other specified aftercare: Secondary | ICD-10-CM | POA: Insufficient documentation

## 2022-04-14 DIAGNOSIS — C250 Malignant neoplasm of head of pancreas: Secondary | ICD-10-CM | POA: Diagnosis not present

## 2022-04-14 DIAGNOSIS — T451X5A Adverse effect of antineoplastic and immunosuppressive drugs, initial encounter: Secondary | ICD-10-CM

## 2022-04-14 DIAGNOSIS — C787 Secondary malignant neoplasm of liver and intrahepatic bile duct: Secondary | ICD-10-CM | POA: Diagnosis not present

## 2022-04-14 DIAGNOSIS — Z5111 Encounter for antineoplastic chemotherapy: Secondary | ICD-10-CM | POA: Insufficient documentation

## 2022-04-14 DIAGNOSIS — D701 Agranulocytosis secondary to cancer chemotherapy: Secondary | ICD-10-CM | POA: Diagnosis not present

## 2022-04-14 DIAGNOSIS — R197 Diarrhea, unspecified: Secondary | ICD-10-CM | POA: Insufficient documentation

## 2022-04-14 DIAGNOSIS — R7989 Other specified abnormal findings of blood chemistry: Secondary | ICD-10-CM | POA: Diagnosis not present

## 2022-04-14 DIAGNOSIS — C259 Malignant neoplasm of pancreas, unspecified: Secondary | ICD-10-CM | POA: Diagnosis not present

## 2022-04-14 DIAGNOSIS — E876 Hypokalemia: Secondary | ICD-10-CM

## 2022-04-14 DIAGNOSIS — R112 Nausea with vomiting, unspecified: Secondary | ICD-10-CM | POA: Insufficient documentation

## 2022-04-14 DIAGNOSIS — M549 Dorsalgia, unspecified: Secondary | ICD-10-CM | POA: Diagnosis not present

## 2022-04-14 DIAGNOSIS — Z803 Family history of malignant neoplasm of breast: Secondary | ICD-10-CM | POA: Insufficient documentation

## 2022-04-14 DIAGNOSIS — Z452 Encounter for adjustment and management of vascular access device: Secondary | ICD-10-CM | POA: Insufficient documentation

## 2022-04-14 HISTORY — PX: IR CHEST FLUORO: IMG2383

## 2022-04-14 LAB — CBC WITH DIFFERENTIAL (CANCER CENTER ONLY)
Abs Immature Granulocytes: 0 10*3/uL (ref 0.00–0.07)
Basophils Absolute: 0 10*3/uL (ref 0.0–0.1)
Basophils Relative: 1 %
Eosinophils Absolute: 0 10*3/uL (ref 0.0–0.5)
Eosinophils Relative: 1 %
HCT: 33.2 % — ABNORMAL LOW (ref 36.0–46.0)
Hemoglobin: 11 g/dL — ABNORMAL LOW (ref 12.0–15.0)
Immature Granulocytes: 0 %
Lymphocytes Relative: 60 %
Lymphs Abs: 0.8 10*3/uL (ref 0.7–4.0)
MCH: 28 pg (ref 26.0–34.0)
MCHC: 33.1 g/dL (ref 30.0–36.0)
MCV: 84.5 fL (ref 80.0–100.0)
Monocytes Absolute: 0.2 10*3/uL (ref 0.1–1.0)
Monocytes Relative: 16 %
Neutro Abs: 0.3 10*3/uL — CL (ref 1.7–7.7)
Neutrophils Relative %: 22 %
Platelet Count: 134 10*3/uL — ABNORMAL LOW (ref 150–400)
RBC: 3.93 MIL/uL (ref 3.87–5.11)
RDW: 12.8 % (ref 11.5–15.5)
Smear Review: NORMAL
WBC Count: 1.4 10*3/uL — ABNORMAL LOW (ref 4.0–10.5)
nRBC: 0 % (ref 0.0–0.2)

## 2022-04-14 LAB — CMP (CANCER CENTER ONLY)
ALT: 87 U/L — ABNORMAL HIGH (ref 0–44)
AST: 85 U/L — ABNORMAL HIGH (ref 15–41)
Albumin: 4.2 g/dL (ref 3.5–5.0)
Alkaline Phosphatase: 137 U/L — ABNORMAL HIGH (ref 38–126)
Anion gap: 7 (ref 5–15)
BUN: 9 mg/dL (ref 6–20)
CO2: 30 mmol/L (ref 22–32)
Calcium: 9.4 mg/dL (ref 8.9–10.3)
Chloride: 103 mmol/L (ref 98–111)
Creatinine: 0.7 mg/dL (ref 0.44–1.00)
GFR, Estimated: >60 mL/min (ref >60)
Glucose, Bld: 119 mg/dL — ABNORMAL HIGH (ref 70–99)
Potassium: 3.2 mmol/L — ABNORMAL LOW (ref 3.5–5.1)
Sodium: 140 mmol/L (ref 135–145)
Total Bilirubin: 0.4 mg/dL (ref 0.3–1.2)
Total Protein: 7.3 g/dL (ref 6.5–8.1)

## 2022-04-14 MED ORDER — HYDROCODONE-ACETAMINOPHEN 10-325 MG PO TABS: 1.0000 | ORAL_TABLET | Freq: Four times a day (QID) | ORAL | 0 refills | Status: DC | PRN

## 2022-04-14 MED ORDER — FILGRASTIM-SNDZ 300 MCG/0.5ML IJ SOSY
300.0000 ug | PREFILLED_SYRINGE | Freq: Once | INTRAMUSCULAR | Status: AC
  Administered 2022-04-14: 300 ug via SUBCUTANEOUS
  Filled 2022-04-14: qty 0.5

## 2022-04-14 MED ORDER — SCOPOLAMINE 1 MG/3DAYS TD PT72: 1.0000 | MEDICATED_PATCH | TRANSDERMAL | 12 refills | Status: DC

## 2022-04-14 MED ORDER — SODIUM CHLORIDE 0.9% FLUSH
10.0000 mL | Freq: Once | INTRAVENOUS | Status: AC
  Administered 2022-04-14: 10 mL

## 2022-04-14 MED ORDER — POTASSIUM CHLORIDE CRYS ER 20 MEQ PO TBCR
40.0000 meq | EXTENDED_RELEASE_TABLET | Freq: Once | ORAL | Status: DC
  Filled 2022-04-14: qty 2

## 2022-04-14 MED ORDER — MORPHINE SULFATE ER 30 MG PO TBCR: 30.0000 mg | EXTENDED_RELEASE_TABLET | Freq: Two times a day (BID) | ORAL | 0 refills | Status: DC

## 2022-04-14 NOTE — Progress Notes (Signed)
Today's potassium level 3.2.  Per Dede Query PA's orders pt was given oral potassium chloride SA (KLOR-CON M) CR tablet 40 mEq.  Pt without complications or concerns

## 2022-04-14 NOTE — Patient Instructions (Signed)
Filgrastim Injection What is this medication? FILGRASTIM (fil GRA stim) lowers the risk of infection in people who are receiving chemotherapy. It works by helping your body make more white blood cells, which protects your body from infection. It may also be used to help people who have been exposed to high doses of radiation. It can be used to help prepare your body before a stem cell transplant. It works by helping your bone marrow make and release stem cells into the blood. This medicine may be used for other purposes; ask your health care provider or pharmacist if you have questions. COMMON BRAND NAME(S): Neupogen, Nivestym, Releuko, Zarxio What should I tell my care team before I take this medication? They need to know if you have any of these conditions: History of blood diseases, such as sickle cell anemia Kidney disease Recent or ongoing radiation An unusual or allergic reaction to filgrastim, pegfilgrastim, latex, rubber, other medications, foods, dyes, or preservatives Pregnant or trying to get pregnant Breast-feeding How should I use this medication? This medication is injected under the skin or into a vein. It is usually given by your care team in a hospital or clinic setting. It may be given at home. If you get this medication at home, you will be taught how to prepare and give it. Use exactly as directed. Take it as directed on the prescription label at the same time every day. Keep taking it unless your care team tells you to stop. It is important that you put your used needles and syringes in a special sharps container. Do not put them in a trash can. If you do not have a sharps container, call your pharmacist or care team to get one. This medication comes with INSTRUCTIONS FOR USE. Ask your pharmacist for directions on how to use this medication. Read the information carefully. Talk to your pharmacist or care team if you have questions. Talk to your care team about the use of this  medication in children. While it may be prescribed for children for selected conditions, precautions do apply. Overdosage: If you think you have taken too much of this medicine contact a poison control center or emergency room at once. NOTE: This medicine is only for you. Do not share this medicine with others. What if I miss a dose? It is important not to miss any doses. Talk to your care team about what to do if you miss a dose. What may interact with this medication? Medications that may cause a release of neutrophils, such as lithium This list may not describe all possible interactions. Give your health care provider a list of all the medicines, herbs, non-prescription drugs, or dietary supplements you use. Also tell them if you smoke, drink alcohol, or use illegal drugs. Some items may interact with your medicine. What should I watch for while using this medication? Your condition will be monitored carefully while you are receiving this medication. You may need bloodwork while taking this medication. Talk to your care team about your risk of cancer. You may be more at risk for certain types of cancer if you take this medication. What side effects may I notice from receiving this medication? Side effects that you should report to your care team as soon as possible: Allergic reactions--skin rash, itching, hives, swelling of the face, lips, tongue, or throat Capillary leak syndrome--stomach or muscle pain, unusual weakness or fatigue, feeling faint or lightheaded, decrease in the amount of urine, swelling of the ankles, hands, or   feet, trouble breathing High white blood cell level--fever, fatigue, trouble breathing, night sweats, change in vision, weight loss Inflammation of the aorta--fever, fatigue, back, chest, or stomach pain, severe headache Kidney injury (glomerulonephritis)--decrease in the amount of urine, red or dark brown urine, foamy or bubbly urine, swelling of the ankles, hands, or  feet Shortness of breath or trouble breathing Spleen injury--pain in upper left stomach or shoulder Unusual bruising or bleeding Side effects that usually do not require medical attention (report to your care team if they continue or are bothersome): Back pain Bone pain Fatigue Fever Headache Nausea This list may not describe all possible side effects. Call your doctor for medical advice about side effects. You may report side effects to FDA at 1-800-FDA-1088. Where should I keep my medication? Keep out of the reach of children and pets. Keep this medication in the original packaging until you are ready to take it. Protect from light. See product for storage information. Each product may have different instructions. Get rid of any unused medication after the expiration date. To get rid of medications that are no longer needed or have expired: Take the medication to a medications take-back program. Check with your pharmacy or law enforcement to find a location. If you cannot return the medication, ask your pharmacist or care team how to get rid of this medication safely. NOTE: This sheet is a summary. It may not cover all possible information. If you have questions about this medicine, talk to your doctor, pharmacist, or health care provider.  2023 Elsevier/Gold Standard (2021-05-04 00:00:00)  

## 2022-04-14 NOTE — Telephone Encounter (Signed)
Spoke with patient about treatment being deferred and having to schedule at [facility] facility. Patient is ok with how appointments need to be scheduled due to no space in infusion. Patient will be notified.

## 2022-04-14 NOTE — Progress Notes (Signed)
[place] [phone]   [phone]  PROGRESS NOTE:  Patient Care Team: Robyne Peers, MD as PCP - General (Family Medicine) Gordy Levan, MD as Consulting Physician (Medical Oncology)  CHIEF COMPLAINTS/PURPOSE OF CONSULTATION:  Metastatic pancreatic adenocarcinoma involving the liver  ONCOLOGIC HISTORY: Presented with nausea and right upper quadrant pain 03/08/2022: Abdominal US showed multiple hypoechoic masses within the liver are indeterminate.  03/10/2022: MR abdomen: Hypoenhancing mass compatible with pancreatic head adenocarcinoma with numerous targetoid enhancing masses in all segments of the liver, compatible with metastatic disease. The mass severely effaces and nearly occludes the portal vein and splenic vein, and occludes the superior mesenteric vein with some collaterals noted. The mass substantially abuts the proximal transverse duodenum, although overt duodenal invasion is indeterminate. 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 Prominent stool throughout the colon favors constipation. Fluid-fluid level in the gallbladder likely from sludge. 03/14/2022: Establish care with [facility] Hematology/Oncology 03/17/2022: CT chest: no definitive evidence of lung metastases.  03/22/2022: Liver biopsy confirmed metastatic adenocarcinoma, pancreatic primary.  03/30/2022: Cycle 1, Day 1 of mFOLFIRINOX 04/14/2022: Cycle 2, Day 1 of mFOLFIRINOX HELD due to [place] 300.   HISTORY OF PRESENTING ILLNESS:  Kaitlin Ferrell presents today for a follow up before Cycle 2, Day 1 of mFOLFIRINOX.   Ms. Schnell reports she had  nausea with some vomiting for several days after chemotherapy. She did taking zofran once a day and zyprexa in the evening with minimal improvement. She reports having several episodes of diarrhea, up to 5-6 per day. She was taking liquid imodium with no improvement. She reports her abdominal pain and back pain are not well controlled with her current pain regimen. She needed to take 1-3 tablets of Norco at a time due to break through pain. She reports decreased appetite mainly due to the nausea and diarrhea. She reports having a scratchy throat but no mouth ulcers. She denies easy bruising or signs of bleeding. She denies fevers, chills, sweats, shortness of breath, chest pain, cough, peripheral neuropathy or cold sensitivity. She has no other complaints. Rest of the 10 point ROS is below.   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   treated with 4 cycles of actinomycin D from 12-02-11 thru 01-13-12   H/O molar pregnancy, antepartum    Hypertension    Migraine    Miscarriage     SURGICAL HISTORY: Past Surgical History:  Procedure Laterality Date   BREAST BIOPSY  2001   Left   BREAST LUMPECTOMY     DILATION AND CURETTAGE OF UTERUS  1992, 09/2011   DILATION AND EVACUATION  2011   INSERTION OF MESH N/A 09/03/2013   Procedure: INSERTION OF MESH;  Surgeon: Adin Hector, MD;  Location: [facility];  Service: General;  Laterality: N/A;   IR IMAGING GUIDED PORT INSERTION  03/22/2022   IR US LIVER BIOPSY  03/22/2022   PORTACATH PLACEMENT  11/2011   PORTACATH PLACEMENT     REMOVAL  MARCH 2014  VENTRAL HERNIA REPAIR N/A 09/03/2013   Procedure: LAPAROSCOPIC VENTRAL WALL HERNIA REPAIR;  Surgeon: Adin Hector, MD;  Location: [facility];  Service: General;  Laterality: N/A;   WISDOM TOOTH EXTRACTION     in  11th grade    SOCIAL HISTORY: Social History   Socioeconomic History   Marital status: Married    Spouse name: [name]   Number of children: 2   Years of education: college   Highest education level: Not on file  Occupational History    Comment: [employer]  Tobacco Use   Smoking status: Never   Smokeless tobacco: Never  Substance and Sexual Activity   Alcohol use: Yes    Alcohol/week: 1.0 standard drink of alcohol    Types: 1 Glasses of wine per week    Comment: occas   Drug use: No   Sexual activity: Not Currently    Birth control/protection: Pill  Other Topics Concern   Not on file  Social History Narrative   Patient is married [name]). Patient works part time at [employer].   Right handed.   Patient has her CMA.   Caffeine- None         Social Determinants of Health   Financial Resource Strain: Not on file  Food Insecurity: No Food Insecurity (03/14/2022)   Hunger Vital Sign    Worried About Running Out of Food in the Last Year: Never true    Ran Out of Food in the Last Year: Never true  Transportation Needs: No Transportation Needs (03/14/2022)   PRAPARE - Hydrologist (Medical): No    Lack of Transportation (Non-Medical): No  Physical Activity: Not on file  Stress: Not on file  Social Connections: Not on file  Intimate Partner Violence: Not At Risk (03/14/2022)   Humiliation, Afraid, Rape, and Kick questionnaire    Fear of Current or Ex-Partner: No    Emotionally Abused: No    Physically Abused: No    Sexually Abused: No    FAMILY HISTORY: Family History  Problem Relation Age of Onset   Breast cancer Mother        dx > 25   Heart attack Mother    Diabetes Father    Breast cancer Maternal Aunt        dx > 65   Breast cancer Maternal Aunt        dx > 19   Breast cancer Maternal Grandmother        ? < 50   Stroke Paternal Grandmother    Alcoholism Other     ALLERGIES:  is allergic to compazine [prochlorperazine  edisylate], propofol, oxycodone-acetaminophen, prochlorperazine, and labetalol.  MEDICATIONS:  Current Outpatient Medications  Medication Sig Dispense Refill   HYDROcodone-acetaminophen (NORCO) 10-325 MG tablet Take 1 tablet by mouth every 6 (six) hours as needed. 120 tablet 0   scopolamine (TRANSDERM-SCOP) 1 MG/3DAYS Place 1 patch (1.5 mg total) onto the skin every 3 (three) days. 10 patch 12   amLODipine (NORVASC) 10 MG tablet Take 1 tablet by mouth daily.     APIXABAN (ELIQUIS) VTE STARTER PACK ('10MG'$  AND '5MG'$ ) Take as directed on package: start with two-'5mg'$  tablets twice daily for 7 days. On day 8, switch to one-'5mg'$  tablet twice daily. Start after liver biopsy and port placement. 1 each 0   diphenoxylate-atropine (LOMOTIL) 2.5-0.025 MG tablet Take 1 tablet by mouth 4 (four) times daily as needed for diarrhea or loose stools. 30 tablet 0  eletriptan (RELPAX) 40 MG tablet Take 1 tablet (40 mg total) by mouth as needed for migraine or headache. May repeat in 2 hours if needed. 15 tablet 6   ibuprofen (ADVIL,MOTRIN) 200 MG tablet Take 400 mg by mouth every 6 (six) hours as needed.     lidocaine-prilocaine (EMLA) cream Apply 1 Application topically as needed. 30 g 0   magnesium oxide (MAG-OX) 400 (241.3 MG) MG tablet TAKE 1 TABLET BY MOUTH TWICE A DAY 60 tablet 6   morphine (MS CONTIN) 30 MG 12 hr tablet Take 1 tablet (30 mg total) by mouth every 12 (twelve) hours. 60 tablet 0   OLANZapine (ZYPREXA) 10 MG tablet Take 1 tablet (10 mg total) by mouth at bedtime. 30 tablet 0   ondansetron (ZOFRAN) 8 MG tablet Take 1 tablet (8 mg total) by mouth every 8 (eight) hours as needed for nausea or vomiting. 90 tablet 0   Riboflavin (B2) 100 MG TABS Take 1 tablet by mouth 2 (two) times daily.  12   No current facility-administered medications for this visit.   Facility-Administered Medications Ordered in Other Visits  Medication Dose Route Frequency Provider Last Rate Last Admin   filgrastim-sndz (ZARXIO)  injection 300 mcg  300 mcg Subcutaneous Once Yvette Loveless T, PA-C       potassium chloride SA (KLOR-CON M) CR tablet 40 mEq  40 mEq Oral Once Dede Query T, PA-C        REVIEW OF SYSTEMS:   Constitutional: ( - ) fevers, ( - )  chills , ( - ) night sweats Eyes: ( - ) blurriness of vision, ( - ) double vision, ( - ) watery eyes Ears, nose, mouth, throat, and face: ( - ) mucositis, ( - ) sore throat Respiratory: ( - ) cough, ( - ) dyspnea, ( - ) wheezes Cardiovascular: ( - ) palpitation, ( - ) chest discomfort, ( - ) lower extremity swelling Gastrointestinal:  (+) nausea, ( - ) heartburn, ( +) change in bowel habits Skin: ( - ) abnormal skin rashes Lymphatics: ( - ) new lymphadenopathy, ( - ) easy bruising Neurological: ( - ) numbness, ( - ) tingling, ( - ) new weaknesses Behavioral/Psych: ( - ) mood change, ( - ) new changes  All other systems were reviewed with the patient and are negative.  PHYSICAL EXAMINATION: ECOG PERFORMANCE STATUS: 1 - Symptomatic but completely ambulatory  Vitals:   04/14/22 0909  BP: 135/75  Pulse: 96  Resp: 14  Temp: 98.5 F (36.9 C)  SpO2: 100%    Filed Weights   04/14/22 0909  Weight: 154 lb 4.8 oz (70 kg)    GENERAL: well appearing female in NAD  SKIN: skin color, texture, turgor are normal, no rashes or significant lesions EYES: conjunctiva are pink and non-injected, sclera clear LUNGS: clear to auscultation and percussion with normal breathing effort HEART: regular rate & rhythm and no murmurs and no lower extremity edema ABDOMEN:  normal bowel sounds. Abdomen is tender to palpation in epigastric and RUQ region. No palpable masses or hepatomegaly appreciated.  Musculoskeletal: no cyanosis of digits and no clubbing  PSYCH: alert & oriented x 3, fluent speech NEURO: no focal motor/sensory deficits  LABORATORY DATA:  I have reviewed the data as listed    Latest Ref Rng & Units 04/14/2022    8:20 AM 04/09/2022    3:54 PM 03/30/2022    7:49 AM   CBC  WBC 4.0 - 10.5 K/uL 1.4  2.0  4.6   Hemoglobin 12.0 - 15.0 g/dL 11.0  12.4  11.6   Hematocrit 36.0 - 46.0 % 33.2  38.9  35.4   Platelets 150 - 400 K/uL 134  101  197        Latest Ref Rng & Units 04/14/2022    8:20 AM 04/09/2022    3:54 PM 03/30/2022    7:49 AM  CMP  Glucose 70 - 99 mg/dL 119  100  101   BUN 6 - 20 mg/dL '9  12  11   '$ Creatinine 0.44 - 1.00 mg/dL 0.70  0.76  0.75   Sodium 135 - 145 mmol/L 140  137  139   Potassium 3.5 - 5.1 mmol/L 3.2  3.5  4.0   Chloride 98 - 111 mmol/L 103  102  106   CO2 22 - 32 mmol/L '30  26  28   '$ Calcium 8.9 - 10.3 mg/dL 9.4  9.3  9.0   Total Protein 6.5 - 8.1 g/dL 7.3  8.2  7.2   Total Bilirubin 0.3 - 1.2 mg/dL 0.4  0.5  0.5   Alkaline Phos 38 - 126 U/L 137  81  73   AST 15 - 41 U/L 85  22  27   ALT 0 - 44 U/L 87  24  24      RADIOGRAPHIC STUDIES: I have personally reviewed the radiological images as listed and agreed with the findings in the report. IR IMAGING GUIDED PORT INSERTION  Result Date: 03/22/2022 INDICATION: 57 year old female referred for port catheter and liver mass biopsy, with suspected pancreatic metastases EXAM: IMAGE GUIDED PORT CATHETER IMAGE GUIDED LIVER MASS BIOPSY MEDICATIONS: None ANESTHESIA/SEDATION: Moderate (conscious) sedation was employed during this procedure. A total of Versed 3.0 mg and Fentanyl 100 mcg was administered intravenously. Moderate Sedation Time: 30 minutes. The patient's level of consciousness and vital signs were monitored continuously by radiology nursing throughout the procedure under my direct supervision. FLUOROSCOPY TIME:  Fluoroscopy Time:  (0 mGy). COMPLICATIONS: None PROCEDURE: Informed written consent was obtained from the patient after a thorough discussion of the procedural risks, benefits and alternatives. All questions were addressed. Maximal Sterile Barrier Technique was utilized including caps, mask, sterile gowns, sterile gloves, sterile drape, hand hygiene and skin antiseptic. A  timeout was performed prior to the initiation of the procedure. Ultrasound survey was performed with images stored and sent to PACs. Right IJ vein documented to be patent. The right neck and chest was prepped with chlorhexidine, and draped in the usual sterile fashion using maximum barrier technique (cap and mask, sterile gown, sterile gloves, large sterile sheet, hand hygiene and cutaneous antiseptic). Local anesthesia was attained by infiltration with 1% lidocaine without epinephrine. Ultrasound demonstrated patency of the right internal jugular vein, and this was documented with an image. Under real-time ultrasound guidance, this vein was accessed with a 21 gauge micropuncture needle and image documentation was performed. A small dermatotomy was made at the access site with an 11 scalpel. A 0.018" wire was advanced into the SVC and used to estimate the length of the internal catheter. The access needle exchanged for a 56F micropuncture vascular sheath. The 0.018" wire was then removed and a 0.035" wire advanced into the IVC. An appropriate location for the subcutaneous reservoir was selected below the clavicle and an incision was made through the skin and underlying soft tissues. The subcutaneous tissues were then dissected using a combination of blunt and sharp surgical technique and a pocket was formed. A  single lumen power injectable portacatheter was then tunneled through the subcutaneous tissues from the pocket to the dermatotomy and the port reservoir placed within the subcutaneous pocket. Given that the port catheter was placed in a region near by a prior port catheter location, anchor suture was performed. The venous access site was then serially dilated and a peel away vascular sheath placed over the wire. The wire was removed and the port catheter advanced into position under fluoroscopic guidance. The catheter tip is positioned in the cavoatrial junction. This was documented with a spot image. The  portacatheter was then tested and found to flush and aspirate well. The port was flushed with saline followed by 100 units/mL heparinized saline. The pocket was then closed in two layers using first subdermal inverted interrupted absorbable sutures followed by a running subcuticular suture. The epidermis was then sealed with Dermabond. The dermatotomy at the venous access site was also seal with Dermabond. We then proceeded with image guided liver mass biopsy. The right lower thorax/right upper abdomen was prepped with chlorhexidine in a sterile fashion, and a sterile drape was applied covering the operative field. A sterile gown and sterile gloves were used for the procedure. Local anesthesia was provided with 1% Lidocaine. Ultrasound survey of the right liver lobe performed with images stored and sent to PACs. The patient was prepped and draped sterilely and the skin and subcutaneous tissues were generously infiltrated with 1% lidocaine. A 17 gauge introducer needle was then advanced under ultrasound guidance in a subcostal location into the left liver lobe. The stylet was removed, and multiple separate 18 gauge core biopsy were retrieved. Samples were placed into formalin for transportation to the lab. Gel-Foam pledgets were then infused with a small amount of saline for assistance with hemostasis. The needle was removed, and a final ultrasound image was performed. The patient tolerated the procedure well and remained hemodynamically stable throughout. No complications were encountered and no significant blood loss was encounter. IMPRESSION: Status post image guided right IJ port catheter placement, and ultrasound-guided liver mass biopsy. Signed, Dulcy Fanny. Nadene Rubins, RPVI [name] Radiology Electronically Signed   By: Corrie Mckusick D.O.   On: 03/22/2022 14:41   IR US LIVER BIOPSY  Result Date: 03/22/2022 INDICATION: 57 year old female referred for  port catheter and liver mass biopsy, with suspected pancreatic metastases EXAM: IMAGE GUIDED PORT CATHETER IMAGE GUIDED LIVER MASS BIOPSY MEDICATIONS: None ANESTHESIA/SEDATION: Moderate (conscious) sedation was employed during this procedure. A total of Versed 3.0 mg and Fentanyl 100 mcg was administered intravenously. Moderate Sedation Time: 30 minutes. The patient's level of consciousness and vital signs were monitored continuously by radiology nursing throughout the procedure under my direct supervision. FLUOROSCOPY TIME:  Fluoroscopy Time:  (0 mGy). COMPLICATIONS: None PROCEDURE: Informed written consent was obtained from the patient after a thorough discussion of the procedural risks, benefits and alternatives. All questions were addressed. Maximal Sterile Barrier Technique was utilized including caps, mask, sterile gowns, sterile gloves, sterile drape, hand hygiene and skin antiseptic. A timeout was performed prior to the initiation of the procedure. Ultrasound survey was performed with images stored and sent to PACs. Right IJ vein documented to be patent. The right neck and chest was prepped with chlorhexidine, and draped in the usual sterile fashion using maximum barrier technique (cap and mask, sterile gown, sterile gloves, large sterile sheet, hand hygiene and cutaneous antiseptic). Local anesthesia was attained by infiltration with 1% lidocaine without epinephrine. Ultrasound demonstrated patency of the  right internal jugular vein, and this was documented with an image. Under real-time ultrasound guidance, this vein was accessed with a 21 gauge micropuncture needle and image documentation was performed. A small dermatotomy was made at the access site with an 11 scalpel. A 0.018" wire was advanced into the SVC and used to estimate the length of the internal catheter. The access needle exchanged for a 17F micropuncture vascular sheath. The 0.018" wire was then removed and a 0.035" wire advanced into the IVC.  An appropriate location for the subcutaneous reservoir was selected below the clavicle and an incision was made through the skin and underlying soft tissues. The subcutaneous tissues were then dissected using a combination of blunt and sharp surgical technique and a pocket was formed. A single lumen power injectable portacatheter was then tunneled through the subcutaneous tissues from the pocket to the dermatotomy and the port reservoir placed within the subcutaneous pocket. Given that the port catheter was placed in a region near by a prior port catheter location, anchor suture was performed. The venous access site was then serially dilated and a peel away vascular sheath placed over the wire. The wire was removed and the port catheter advanced into position under fluoroscopic guidance. The catheter tip is positioned in the cavoatrial junction. This was documented with a spot image. The portacatheter was then tested and found to flush and aspirate well. The port was flushed with saline followed by 100 units/mL heparinized saline. The pocket was then closed in two layers using first subdermal inverted interrupted absorbable sutures followed by a running subcuticular suture. The epidermis was then sealed with Dermabond. The dermatotomy at the venous access site was also seal with Dermabond. We then proceeded with image guided liver mass biopsy. The right lower thorax/right upper abdomen was prepped with chlorhexidine in a sterile fashion, and a sterile drape was applied covering the operative field. A sterile gown and sterile gloves were used for the procedure. Local anesthesia was provided with 1% Lidocaine. Ultrasound survey of the right liver lobe performed with images stored and sent to PACs. The patient was prepped and draped sterilely and the skin and subcutaneous tissues were generously infiltrated with 1% lidocaine. A 17 gauge introducer needle was then advanced under ultrasound guidance in a subcostal  location into the left liver lobe. The stylet was removed, and multiple separate 18 gauge core biopsy were retrieved. Samples were placed into formalin for transportation to the lab. Gel-Foam pledgets were then infused with a small amount of saline for assistance with hemostasis. The needle was removed, and a final ultrasound image was performed. The patient tolerated the procedure well and remained hemodynamically stable throughout. No complications were encountered and no significant blood loss was encounter. IMPRESSION: Status post image guided right IJ port catheter placement, and ultrasound-guided liver mass biopsy. Signed, Dulcy Fanny. Nadene Rubins, RPVI [facility] Radiology Electronically Signed   By: Corrie Mckusick D.O.   On: 03/22/2022 14:41   CT Chest W Contrast  Result Date: 03/18/2022 CLINICAL DATA:  57 year old female with history of pancreatic cancer. Staging examination. * Tracking Code: BO * EXAM: CT CHEST WITH CONTRAST TECHNIQUE: Multidetector CT imaging of the chest was performed during intravenous contrast administration. RADIATION DOSE REDUCTION: This exam was performed according to the departmental dose-optimization program which includes automated exposure control, adjustment of the mA and/or kV according to patient size and/or use of iterative reconstruction technique. CONTRAST:  49m OMNIPAQUE IOHEXOL 300 MG/ML  SOLN COMPARISON:  None Available. FINDINGS: Cardiovascular: Heart size is normal. There is no significant pericardial fluid, thickening or pericardial calcification. Aortic atherosclerosis. No definite coronary artery calcifications. Mediastinum/Nodes: No pathologically enlarged mediastinal or hilar lymph nodes. Esophagus is unremarkable in appearance. No axillary lymphadenopathy. Lungs/Pleura: 3 mm left lower lobe pulmonary nodule (axial image 93 of series 5), nonspecific. No other larger more suspicious appearing pulmonary nodules  or masses are noted. No acute consolidative airspace disease. No pleural effusions. Upper Abdomen: Numerous hypovascular hepatic lesions scattered throughout the visualized liver better demonstrated on recent abdominal MRI 03/10/2022, compatible with metastatic disease, with the largest of these in segment 4A on today's examination measuring at least 2.4 x 2.4 cm, similar to the recent prior study. Musculoskeletal: There are no aggressive appearing lytic or blastic lesions noted in the visualized portions of the skeleton. IMPRESSION: 1. No definitive findings to suggest metastatic disease to the thorax. 2. 3 mm left lower lobe pulmonary nodule, nonspecific, but statistically likely benign. Continued attention on routine follow-up imaging is recommended to exclude the possibility of a metastatic lesion. 3. Aortic atherosclerosis. 4. Widespread metastatic disease to the liver better demonstrated on recent abdominal MRI 03/10/2022, please see that report for full description. Aortic Atherosclerosis (ICD10-I70.0). Electronically Signed   By: Vinnie Langton M.D.   On: 03/18/2022 08:50    ASSESSMENT & PLAN Kaitlin Ferrell is a 57 y.o. female who returns to the clinic for newly diagnosed pancreatic adenocarcinoma.    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 Dr. Lorenso Courier recommends chemotherapy regimen with FOLFIRINOX q 2 weeks.  --Plan to obtain restaging CT scans every 3 months to assess treatment response.  --[facility] Testing: MS-stable, 2 Muts/Mb, [id number], KRASG12L, MDM2 amplification, CDKN2A/B. No targetable mutations.  --Started mFOLFIRINOX on 03/30/2022 PLAN: --Due to start Cycle 2, Day 1 of FOLFIRINX today --Labs  from today reviewed with patient.  WBC 1.4, ANC 300, Hgb 11.0, Plt 134 --Treatment will be HELD due to neutropenia.  --RTC in 1 weeks with labs and follow up before Cycle 2, Day 1.   Port issues: --Flush was unable to access port today --Ordered chest xray to confirm port placement --IR will assess patient later today.   Neutropenia: --Due to critical levels with ANC 300, gave one dose of Zarzio 300 mcg in clinic today --Defer treatment by one week and plan to give Udenyca on Day 3 starting with Cycle 2.  --Neutropenic precautions given to patient including monitoring for fevers.   Elevated LFTs: --ALT 87, AST 85. --Etiology includes chemotherapy induced, liver metastases versus increased acetaminophen use. --Monitor for now and advised to take 1 tablet of Norco every 6 hours as prescribed.  Hypokalemia: --Potassium level is 3.2. --Likely secondary to vomiting/diarrhea --Gave oral Potassium Chloride 40 mEq today  Nausea/Vomiting: --Secondary to chemotherapy -- Currently on zofran '8mg'$  q8H PRN for nausea, zyprexa nightly PRN for nausea. Encouraged patient to take zofran q 8 hours rather than once.  -- Sent scopolamine patch as well.  -- Has allergy to compazine and phernegan.   #Epigastric/RUQ/back pain: --Likely secondary to underlying malignancy --Tramadol was ineffective.  --Current pain regimen includes MS Contin 15 mg q 12 hours and Norco 5-325 mg for breakthrough pain. Pain is not well controlled so we will increase dose of MS contin to 30 mg q 12 hours and Norco 10-325  mg. Advised not to double or triple dose of Norco without provider instruction.    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 --Sent referral to [facility] nutrition team  #Family history of breast cancer: --Due to strong family history of cancer and has undergone genetic  testing in the past. With the anticipated diagnosis of pancreatic cancer, we will make a referral to genetics to see if additional testing is required.   #Supportive Care -- chemotherapy education to be scheduled  -- port placement complete  -- EMLA cream for port   Orders Placed This Encounter  Procedures   DG Chest 2 View    Standing Status:   Future    Standing Expiration Date:   04/14/2023    Order Specific Question:   Reason for Exam (SYMPTOM  OR DIAGNOSIS REQUIRED)    Answer:   evaluate port placement    Order Specific Question:   Is patient pregnant?    Answer:   No    Order Specific Question:   Preferred imaging location?    Answer:   [facility]   IR CV Line Injection    Standing Status:   Future    Standing Expiration Date:   04/14/2023    Order Specific Question:   Reason for Exam (SYMPTOM  OR DIAGNOSIS REQUIRED)    Answer:   port-a-cath not accessible by chemo infusion team    Order Specific Question:   Is the patient pregnant?    Answer:   No    Order Specific Question:   Preferred Imaging Location?    Answer:   [facility]   CBC with Differential ([place] Only)    Standing Status:   Future    Standing Expiration Date:   04/21/2023   CMP ([place] only)    Standing Status:   Future    Standing Expiration Date:   04/21/2023    All questions were answered. The patient knows to call the clinic with any problems, questions or concerns.  I have spent a total of 30 minutes minutes of face-to-face and non-face-to-face time, preparing to see the patient,  performing a medically appropriate examination, counseling and educating the patient, documenting clinical information in the electronic health record,  and care coordination.   Dede Query, PA-C Department of Hematology/Oncology [place] [facility] Phone: [phone]

## 2022-04-15 ENCOUNTER — Other Ambulatory Visit: Payer: Self-pay | Admitting: Physician Assistant

## 2022-04-15 MED ORDER — HYDROCODONE-ACETAMINOPHEN 10-325 MG PO TABS: 1.0000 | ORAL_TABLET | Freq: Four times a day (QID) | ORAL | 0 refills | Status: DC | PRN

## 2022-04-15 MED ORDER — MORPHINE SULFATE ER 30 MG PO TBCR: 30.0000 mg | EXTENDED_RELEASE_TABLET | Freq: Two times a day (BID) | ORAL | 0 refills | Status: DC

## 2022-04-16 ENCOUNTER — Inpatient Hospital Stay: Payer: BC Managed Care – PPO

## 2022-04-16 ENCOUNTER — Encounter: Payer: Self-pay | Admitting: Physician Assistant

## 2022-04-18 ENCOUNTER — Other Ambulatory Visit: Payer: Self-pay | Admitting: Physician Assistant

## 2022-04-19 ENCOUNTER — Ambulatory Visit (HOSPITAL_COMMUNITY)
Admission: RE | Admit: 2022-04-19 | Discharge: 2022-04-19 | Disposition: A | Discharge status: Home / Self Care | Payer: BC Managed Care – PPO | Source: Ambulatory Visit | Attending: Physician Assistant | Admitting: Physician Assistant

## 2022-04-19 ENCOUNTER — Other Ambulatory Visit: Payer: Self-pay

## 2022-04-19 ENCOUNTER — Other Ambulatory Visit: Payer: Self-pay | Admitting: Physician Assistant

## 2022-04-19 DIAGNOSIS — Z95828 Presence of other vascular implants and grafts: Secondary | ICD-10-CM

## 2022-04-19 DIAGNOSIS — T82598A Other mechanical complication of other cardiac and vascular devices and implants, initial encounter: Secondary | ICD-10-CM | POA: Diagnosis not present

## 2022-04-19 DIAGNOSIS — Z452 Encounter for adjustment and management of vascular access device: Secondary | ICD-10-CM | POA: Diagnosis not present

## 2022-04-19 DIAGNOSIS — T82524A Displacement of infusion catheter, initial encounter: Secondary | ICD-10-CM | POA: Insufficient documentation

## 2022-04-19 DIAGNOSIS — C259 Malignant neoplasm of pancreas, unspecified: Secondary | ICD-10-CM | POA: Diagnosis not present

## 2022-04-19 DIAGNOSIS — Y849 Medical procedure, unspecified as the cause of abnormal reaction of the patient, or of later complication, without mention of misadventure at the time of the procedure: Secondary | ICD-10-CM | POA: Insufficient documentation

## 2022-04-19 DIAGNOSIS — C801 Malignant (primary) neoplasm, unspecified: Secondary | ICD-10-CM | POA: Diagnosis not present

## 2022-04-19 DIAGNOSIS — C7889 Secondary malignant neoplasm of other digestive organs: Secondary | ICD-10-CM | POA: Diagnosis not present

## 2022-04-19 HISTORY — PX: IR PORT REPAIR CENTRAL VENOUS ACCESS DEVICE: IMG5775

## 2022-04-19 MED ORDER — MIDAZOLAM HCL 2 MG/2ML IJ SOLN
INTRAMUSCULAR | Status: AC | PRN
  Administered 2022-04-19 (×2): .5 mg via INTRAVENOUS
  Administered 2022-04-19: 1 mg via INTRAVENOUS

## 2022-04-19 MED ORDER — FENTANYL CITRATE (PF) 100 MCG/2ML IJ SOLN
INTRAMUSCULAR | Status: AC | PRN
  Administered 2022-04-19 (×2): 25 ug via INTRAVENOUS
  Administered 2022-04-19: 50 ug via INTRAVENOUS

## 2022-04-19 MED ORDER — MIDAZOLAM HCL 2 MG/2ML IJ SOLN
INTRAMUSCULAR | Status: AC
  Filled 2022-04-19: qty 2

## 2022-04-19 MED ORDER — FENTANYL CITRATE (PF) 100 MCG/2ML IJ SOLN
INTRAMUSCULAR | Status: AC
  Filled 2022-04-19: qty 2

## 2022-04-19 MED ORDER — HEPARIN SOD (PORK) LOCK FLUSH 100 UNIT/ML IV SOLN
INTRAVENOUS | Status: AC
  Filled 2022-04-19: qty 5

## 2022-04-19 MED ORDER — LIDOCAINE-EPINEPHRINE 1 %-1:100000 IJ SOLN
INTRAMUSCULAR | Status: AC
  Administered 2022-04-19: 10 mL
  Filled 2022-04-19: qty 1

## 2022-04-19 NOTE — Procedures (Signed)
Interventional Radiology Procedure Note  Procedure:   - Evaluation/revision of a right IJ approach single lumen PowerPort.  A single additional retention suture was placed at the right apex to rotate slightly back towards midline and serve as a second anchor.   Findings: - Upon opening the pocket, the port is not flipped. The rubber septum faces slightly towards the left shoulder, which may have caused prior confusion. There is a pre-existing and intact retention suture.  - A 1" huber needled was used to access the port.  This length is hubbed with port access.  A 1.25" or 1.5" huber needle may be more useful.  - Tip is positioned at the superior cavoatrial junction and catheter is ready for immediate use.   Complications: None  Recommendations:  - Ok to use - Do not submerge for 7 days - Routine port care - Recommend observing that the rubber septum faces slightly towards the patients left shoulder, and that a 1"huber needled is hubbed at the skin when in the port.   - also recommend access with 1.25" or 1.5" huber needle when accessing.    Signed,  Dulcy Fanny. Earleen Newport, DO

## 2022-04-19 NOTE — H&P (Signed)
Chief Complaint: Patient was seen in consultation today for [facility] a cath revision vs new placement at the request of Thayil,Irene T  Referring Physician(s): Lincoln Brigham  Supervising Physician: Corrie Mckusick  Patient Status: [facility] - Out-pt  History of Present Illness: Kaitlin Ferrell is a 57 y.o. female   Pancreatic cancer PAC placed in IR 03/22/22 Malpositioned per imaging IMPRESSION: 1. Right IJ approach single-lumen power injectable port catheter. The catheter tip overlies the mid SVC. 2. The port reservoir appears slightly tilted with the access site facing somewhat anterolaterally rather than directly anterior  Unsuccessful Fluoro needle access 04/14/22  Scheduled now for revision vs new placement  Past Surgical History:  Procedure Laterality Date   BREAST BIOPSY  2001   Left   BREAST LUMPECTOMY     DILATION AND CURETTAGE OF UTERUS  1992, 09/2011   DILATION AND EVACUATION  2011   INSERTION OF MESH N/A 09/03/2013   Procedure: INSERTION OF MESH;  Surgeon: Adin Hector, MD;  Location: [facility] ORS;  Service: General;  Laterality: N/A;   IR CHEST FLUORO  04/14/2022   IR IMAGING GUIDED PORT INSERTION  03/22/2022   IR US LIVER BIOPSY  03/22/2022   PORTACATH PLACEMENT  11/2011   PORTACATH PLACEMENT     REMOVAL  MARCH 2014   VENTRAL HERNIA REPAIR N/A 09/03/2013   Procedure: LAPAROSCOPIC VENTRAL [place];  Surgeon: Adin Hector, MD;  Location: [facility] ORS;  Service: General;  Laterality: N/A;   WISDOM TOOTH EXTRACTION     in 11th grade    Allergies: Compazine [prochlorperazine edisylate], Propofol, Oxycodone-acetaminophen, Prochlorperazine, and Labetalol  Medications: Prior to Admission medications   Medication Sig Start Date End Date Taking? Authorizing Provider  amLODipine (NORVASC) 10 MG tablet Take 1 tablet by mouth daily. 12/08/21   [provider]  APIXABAN Arne [place]) VTE STARTER PACK ('10MG'$  AND '5MG'$ ) Take as directed on package: start with two-'5mg'$   tablets twice daily for 7 days. On day 8, switch to one-'5mg'$  tablet twice daily. Start after liver biopsy and port placement. 03/14/22   Lincoln Brigham, PA-C  diphenoxylate-atropine (LOMOTIL) 2.5-0.025 MG tablet Take 1 tablet by mouth 4 (four) times daily as needed for diarrhea or loose stools. 04/11/22   Lincoln Brigham, PA-C  eletriptan (RELPAX) 40 MG tablet Take 1 tablet (40 mg total) by mouth as needed for migraine or headache. May repeat in 2 hours if needed. 09/22/14   Dennie Bible, NP  HYDROcodone-acetaminophen [facility]) 10-325 MG tablet Take 1 tablet by mouth every 6 (six) hours as needed. 04/15/22   Lincoln Brigham, PA-C  ibuprofen (ADVIL,MOTRIN) 200 MG tablet Take 400 mg by mouth every 6 (six) hours as needed.    [provider]  lidocaine-prilocaine (EMLA) cream Apply 1 Application topically as needed. 03/14/22   Dede Query T, PA-C  magnesium oxide (MAG-OX) 400 (241.3 MG) MG tablet TAKE 1 TABLET BY MOUTH TWICE A DAY 11/16/14   Dennie Bible, NP  morphine (MS CONTIN) 30 MG 12 hr tablet Take 1 tablet (30 mg total) by mouth every 12 (twelve) hours. 04/15/22   Lincoln Brigham, PA-C  OLANZapine (ZYPREXA) 10 MG tablet Take 1 tablet (10 mg total) by mouth at bedtime. 03/29/22   Dede Query T, PA-C  ondansetron (ZOFRAN) 8 MG tablet Take 1 tablet (8 mg total) by mouth every 8 (eight) hours as needed for nausea or vomiting. 03/14/22   Lincoln Brigham, PA-C  Riboflavin (B2) 100 MG TABS Take  1 tablet by mouth 2 (two) times daily. 02/05/14   [provider]  scopolamine (TRANSDERM-SCOP) 1 MG/3DAYS Place 1 patch (1.5 mg total) onto the skin every 3 (three) days. 04/14/22   Lincoln Brigham, PA-C     Family History  Problem Relation Age of Onset   Breast cancer Mother        dx > 45   Heart attack Mother    Diabetes Father    Breast cancer Maternal Aunt        dx > 19   Breast cancer Maternal Aunt        dx > 42   Breast cancer Maternal Grandmother        ? < 89   Stroke  Paternal Grandmother    Alcoholism Other     Social History   Socioeconomic History   Marital status: Married    Spouse name: [name]   Number of children: 2   Years of education: college   Highest education level: Not on file  Occupational History    Comment: [employer]  Tobacco Use   Smoking status: Never   Smokeless tobacco: Never  Substance and Sexual Activity   Alcohol use: Yes    Alcohol/week: 1.0 standard drink of alcohol    Types: 1 Glasses of wine per week    Comment: occas   Drug use: No   Sexual activity: Not Currently    Birth control/protection: Pill  Other Topics Concern   Not on file  Social History Narrative   Patient is married [name]). Patient works part time at [employer].   Right handed.   Patient has her CMA.   Caffeine- None         Social Determinants of Health   Financial Resource Strain: Not on file  Food Insecurity: No Food Insecurity (03/14/2022)   Hunger Vital Sign    Worried About Running Out of Food in the Last Year: Never true    Ran Out of Food in the Last Year: Never true  Transportation Needs: No Transportation Needs (03/14/2022)   PRAPARE - Hydrologist (Medical): No    Lack of Transportation (Non-Medical): No  Physical Activity: Not on file  Stress: Not on file  Social Connections: Not on file    Review of Systems: A 12 point ROS discussed and pertinent positives are indicated in the HPI above.  All other systems are negative.  Vital Signs: BP (!) 143/82   Pulse 78   Temp (!) 97.3 F (36.3 C) (Temporal)   Resp 14   Ht '5\' 2"'$  (1.575 m)   Wt 154 lb (69.9 kg)   LMP 06/26/2013 Comment: low dose BCP  SpO2 97%   BMI 28.17 kg/m     Physical Exam Vitals reviewed.  HENT:     Mouth/Throat:     Mouth: Mucous membranes are moist.  Cardiovascular:     Rate and Rhythm: Normal rate and regular rhythm.     Heart sounds: Normal heart sounds.  Pulmonary:     Effort: Pulmonary effort is normal.      Breath sounds: Normal breath sounds.  Abdominal:     Palpations: Abdomen is soft.  Musculoskeletal:        General: Normal range of motion.  Skin:    General: Skin is warm.  Neurological:     Mental Status: She is alert and oriented to person, place, and time.  Psychiatric:  Behavior: Behavior normal.     Imaging: DG Chest 2 View  Result Date: 04/16/2022 CLINICAL DATA:  Evaluate port placement EXAM: CHEST - 2 VIEW COMPARISON:  Port placement images [id number] FINDINGS: Right IJ approach single-lumen power injectable port catheter. The catheter tip overlies the mid SVC. The port reservoir appears tilted to the side. Cardiac and mediastinal contours are normal. Atherosclerotic calcifications present in the transverse aorta. The lungs are clear. No acute osseous abnormality. IMPRESSION: 1. Right IJ approach single-lumen power injectable port catheter. The catheter tip overlies the mid SVC. 2. The port reservoir appears slightly tilted with the access site facing somewhat anterolaterally rather than directly anterior. 3. No acute cardiopulmonary process. 4. Atherosclerotic calcifications in the aorta. Electronically Signed   By: Jacqulynn Cadet M.D.   On: 04/16/2022 17:21   IR Chest Fluoro  Result Date: 04/14/2022 INDICATION: Inability to access port catheter, unable to get infusion today. EXAM: Unsuccessful attempt at port catheter access under fluoroscopy MEDICATIONS: None. ANESTHESIA/SEDATION: None. FLUOROSCOPY: Radiation Exposure Index (as provided by the fluoroscopic device): 3.0 mGy Kerma COMPLICATIONS: None immediate. PROCEDURE: Informed written consent was obtained from the patient after a thorough discussion of the procedural risks, benefits and alternatives. All questions were addressed. Maximal Sterile Barrier Technique was utilized including caps, mask, sterile gowns, sterile gloves, sterile drape, hand hygiene and skin antiseptic. A timeout was performed prior to the initiation of the  procedure. Under sterile conditions, fluoroscopic needle access was attempted of the right IJ power port catheter. Despite manual manipulation and fluoroscopic guidance, needle access within the port catheter cannot be obtained. Needle passes made unsuccessfully. By fluoroscopy, the port catheter position has changed compared to the original placement. IMPRESSION: 1. Unsuccessful fluoroscopic needle access of the right IJ power port catheter despite manual manipulation and fluoroscopic guidance. 2. Unable to access the port catheter under fluoroscopy. 3. Port catheter revision will be scheduled as an outpatient electively. Electronically Signed   By: Jerilynn Mages.  Shick M.D.   On: 04/14/2022 14:05   IR IMAGING GUIDED PORT INSERTION  Result Date: 03/22/2022 INDICATION: 56 year old female referred for port catheter and liver mass biopsy, with suspected pancreatic metastases EXAM: IMAGE GUIDED PORT CATHETER IMAGE GUIDED LIVER MASS BIOPSY MEDICATIONS: None ANESTHESIA/SEDATION: Moderate (conscious) sedation was employed during this procedure. A total of Versed 3.0 mg and Fentanyl 100 mcg was administered intravenously. Moderate Sedation Time: 30 minutes. The patient's level of consciousness and vital signs were monitored continuously by radiology nursing throughout the procedure under my direct supervision. FLUOROSCOPY TIME:  Fluoroscopy Time:  (0 mGy). COMPLICATIONS: None PROCEDURE: Informed written consent was obtained from the patient after a thorough discussion of the procedural risks, benefits and alternatives. All questions were addressed. Maximal Sterile Barrier Technique was utilized including caps, mask, sterile gowns, sterile gloves, sterile drape, hand hygiene and skin antiseptic. A timeout was performed prior to the initiation of the procedure. Ultrasound survey was performed with images stored and sent to PACs. Right IJ vein documented to be patent. The right neck and chest was prepped with chlorhexidine, and  draped in the usual sterile fashion using maximum barrier technique (cap and mask, sterile gown, sterile gloves, large sterile sheet, hand hygiene and cutaneous antiseptic). Local anesthesia was attained by infiltration with 1% lidocaine without epinephrine. Ultrasound demonstrated patency of the right internal jugular vein, and this was documented with an image. Under real-time ultrasound guidance, this vein was accessed with a 21 gauge micropuncture needle and image documentation was performed. A small dermatotomy was made at  the access site with an 11 scalpel. A 0.018" wire was advanced into the SVC and used to estimate the length of the internal catheter. The access needle exchanged for a 21F micropuncture vascular sheath. The 0.018" wire was then removed and a 0.035" wire advanced into the IVC. An appropriate location for the subcutaneous reservoir was selected below the clavicle and an incision was made through the skin and underlying soft tissues. The subcutaneous tissues were then dissected using a combination of blunt and sharp surgical technique and a pocket was formed. A single lumen power injectable portacatheter was then tunneled through the subcutaneous tissues from the pocket to the dermatotomy and the port reservoir placed within the subcutaneous pocket. Given that the port catheter was placed in a region near by a prior port catheter location, anchor suture was performed. The venous access site was then serially dilated and a peel away vascular sheath placed over the wire. The wire was removed and the port catheter advanced into position under fluoroscopic guidance. The catheter tip is positioned in the cavoatrial junction. This was documented with a spot image. The portacatheter was then tested and found to flush and aspirate well. The port was flushed with saline followed by 100 units/mL heparinized saline. The pocket was then closed in two layers using first subdermal inverted interrupted  absorbable sutures followed by a running subcuticular suture. The epidermis was then sealed with Dermabond. The dermatotomy at the venous access site was also seal with Dermabond. We then proceeded with image guided liver mass biopsy. The right lower thorax/right upper abdomen was prepped with chlorhexidine in a sterile fashion, and a sterile drape was applied covering the operative field. A sterile gown and sterile gloves were used for the procedure. Local anesthesia was provided with 1% Lidocaine. Ultrasound survey of the right liver lobe performed with images stored and sent to PACs. The patient was prepped and draped sterilely and the skin and subcutaneous tissues were generously infiltrated with 1% lidocaine. A 17 gauge introducer needle was then advanced under ultrasound guidance in a subcostal location into the left liver lobe. The stylet was removed, and multiple separate 18 gauge core biopsy were retrieved. Samples were placed into formalin for transportation to the lab. Gel-Foam pledgets were then infused with a small amount of saline for assistance with hemostasis. The needle was removed, and a final ultrasound image was performed. The patient tolerated the procedure well and remained hemodynamically stable throughout. No complications were encountered and no significant blood loss was encounter. IMPRESSION: Status post image guided right IJ port catheter placement, and ultrasound-guided liver mass biopsy. Signed, Dulcy Fanny. Nadene Rubins, RPVI [facility] Radiology Electronically Signed   By: Corrie Mckusick D.O.   On: 03/22/2022 14:41   IR US LIVER BIOPSY  Result Date: 03/22/2022 INDICATION: 57 year old female referred for port catheter and liver mass biopsy, with suspected pancreatic metastases EXAM: IMAGE GUIDED PORT CATHETER IMAGE GUIDED LIVER MASS BIOPSY MEDICATIONS: None ANESTHESIA/SEDATION: Moderate (conscious) sedation was employed during this  procedure. A total of Versed 3.0 mg and Fentanyl 100 mcg was administered intravenously. Moderate Sedation Time: 30 minutes. The patient's level of consciousness and vital signs were monitored continuously by radiology nursing throughout the procedure under my direct supervision. FLUOROSCOPY TIME:  Fluoroscopy Time:  (0 mGy). COMPLICATIONS: None PROCEDURE: Informed written consent was obtained from the patient after a thorough discussion of the procedural risks, benefits and alternatives. All questions were addressed. Maximal Sterile Barrier Technique was utilized  including caps, mask, sterile gowns, sterile gloves, sterile drape, hand hygiene and skin antiseptic. A timeout was performed prior to the initiation of the procedure. Ultrasound survey was performed with images stored and sent to PACs. Right IJ vein documented to be patent. The right neck and chest was prepped with chlorhexidine, and draped in the usual sterile fashion using maximum barrier technique (cap and mask, sterile gown, sterile gloves, large sterile sheet, hand hygiene and cutaneous antiseptic). Local anesthesia was attained by infiltration with 1% lidocaine without epinephrine. Ultrasound demonstrated patency of the right internal jugular vein, and this was documented with an image. Under real-time ultrasound guidance, this vein was accessed with a 21 gauge micropuncture needle and image documentation was performed. A small dermatotomy was made at the access site with an 11 scalpel. A 0.018" wire was advanced into the SVC and used to estimate the length of the internal catheter. The access needle exchanged for a 96F micropuncture vascular sheath. The 0.018" wire was then removed and a 0.035" wire advanced into the IVC. An appropriate location for the subcutaneous reservoir was selected below the clavicle and an incision was made through the skin and underlying soft tissues. The subcutaneous tissues were then dissected using a combination of blunt  and sharp surgical technique and a pocket was formed. A single lumen power injectable portacatheter was then tunneled through the subcutaneous tissues from the pocket to the dermatotomy and the port reservoir placed within the subcutaneous pocket. Given that the port catheter was placed in a region near by a prior port catheter location, anchor suture was performed. The venous access site was then serially dilated and a peel away vascular sheath placed over the wire. The wire was removed and the port catheter advanced into position under fluoroscopic guidance. The catheter tip is positioned in the cavoatrial junction. This was documented with a spot image. The portacatheter was then tested and found to flush and aspirate well. The port was flushed with saline followed by 100 units/mL heparinized saline. The pocket was then closed in two layers using first subdermal inverted interrupted absorbable sutures followed by a running subcuticular suture. The epidermis was then sealed with Dermabond. The dermatotomy at the venous access site was also seal with Dermabond. We then proceeded with image guided liver mass biopsy. The right lower thorax/right upper abdomen was prepped with chlorhexidine in a sterile fashion, and a sterile drape was applied covering the operative field. A sterile gown and sterile gloves were used for the procedure. Local anesthesia was provided with 1% Lidocaine. Ultrasound survey of the right liver lobe performed with images stored and sent to PACs. The patient was prepped and draped sterilely and the skin and subcutaneous tissues were generously infiltrated with 1% lidocaine. A 17 gauge introducer needle was then advanced under ultrasound guidance in a subcostal location into the left liver lobe. The stylet was removed, and multiple separate 18 gauge core biopsy were retrieved. Samples were placed into formalin for transportation to the lab. Gel-Foam pledgets were then infused with a small amount  of saline for assistance with hemostasis. The needle was removed, and a final ultrasound image was performed. The patient tolerated the procedure well and remained hemodynamically stable throughout. No complications were encountered and no significant blood loss was encounter. IMPRESSION: Status post image guided right IJ port catheter placement, and ultrasound-guided liver mass biopsy. Signed, Dulcy Fanny. Nadene Rubins, RPVI [facility] Radiology Electronically Signed   By: Corrie Mckusick D.O.  On: 03/22/2022 14:41    Labs:  CBC: Recent Labs    03/25/22 0958 03/30/22 0749 04/09/22 1554 04/14/22 0820  WBC 5.9 4.6 2.0* 1.4*  HGB 13.1 11.6* 12.4 11.0*  HCT 39.4 35.4* 38.9 33.2*  PLT 196 197 101* 134*    COAGS: Recent Labs    03/22/22 1230  INR 1.1    BMP: Recent Labs    03/25/22 0958 03/30/22 0749 04/09/22 1554 04/14/22 0820  NA 140 139 137 140  K 3.9 4.0 3.5 3.2*  CL 103 106 102 103  CO2 '31 28 26 30  '$ GLUCOSE 110* 101* 100* 119*  BUN '9 11 12 9  '$ CALCIUM 10.2 9.0 9.3 9.4  CREATININE 0.72 0.75 0.76 0.70  GFRNONAA >60 >60 >60 >60    LIVER FUNCTION TESTS: Recent Labs    03/25/22 0958 03/30/22 0749 04/09/22 1554 04/14/22 0820  BILITOT 0.5 0.5 0.5 0.4  AST 35 27 22 85*  ALT '28 24 24 '$ 87*  ALKPHOS 76 73 81 137*  PROT 7.9 7.2 8.2* 7.3  ALBUMIN 4.6 4.2 4.3 4.2    TUMOR MARKERS: Recent Labs    03/14/22 1040  CEA 53.61*    Assessment and Plan:  Pancreatic cancer PAC was placed in I 03/22/22 Unfortunately; PAC has become malpositioned Today for revision vs new placement Risks and benefits of image guided port-a-catheter placement was discussed with the patient including, but not limited to bleeding, infection, pneumothorax, or fibrin sheath development and need for additional procedures.  All of the patient's questions were answered, patient is agreeable to proceed. Consent signed and in chart.  Thank you for  this interesting consult.  I greatly enjoyed meeting Kaitlin Ferrell and look forward to participating in their care.  A copy of this report was sent to the requesting provider on this date.  Electronically Signed: Lavonia Drafts, PA-C 04/19/2022, 10:48 AM   I spent a total of    25 Minutes in face to face in clinical consultation, greater than 50% of which was counseling/coordinating care for [facility] revision vs new placement

## 2022-04-20 ENCOUNTER — Ambulatory Visit (HOSPITAL_COMMUNITY)
Admission: RE | Admit: 2022-04-20 | Discharge: 2022-04-20 | Disposition: A | Discharge status: Home / Self Care | Payer: BC Managed Care – PPO | Source: Ambulatory Visit | Attending: Hematology and Oncology | Admitting: Hematology and Oncology

## 2022-04-20 ENCOUNTER — Inpatient Hospital Stay (HOSPITAL_BASED_OUTPATIENT_CLINIC_OR_DEPARTMENT_OTHER): Payer: BC Managed Care – PPO | Admitting: Hematology and Oncology

## 2022-04-20 ENCOUNTER — Other Ambulatory Visit: Payer: Self-pay | Admitting: Physician Assistant

## 2022-04-20 ENCOUNTER — Inpatient Hospital Stay: Payer: BC Managed Care – PPO

## 2022-04-20 ENCOUNTER — Other Ambulatory Visit: Payer: Self-pay

## 2022-04-20 ENCOUNTER — Other Ambulatory Visit (HOSPITAL_COMMUNITY): Payer: Self-pay | Admitting: Hematology and Oncology

## 2022-04-20 VITALS — BP 145/79 | HR 77 | Temp 98.1°F | Resp 16 | Wt 156.2 lb

## 2022-04-20 DIAGNOSIS — Z5111 Encounter for antineoplastic chemotherapy: Secondary | ICD-10-CM | POA: Diagnosis not present

## 2022-04-20 DIAGNOSIS — M549 Dorsalgia, unspecified: Secondary | ICD-10-CM | POA: Diagnosis not present

## 2022-04-20 DIAGNOSIS — Z95828 Presence of other vascular implants and grafts: Secondary | ICD-10-CM

## 2022-04-20 DIAGNOSIS — T451X5A Adverse effect of antineoplastic and immunosuppressive drugs, initial encounter: Secondary | ICD-10-CM

## 2022-04-20 DIAGNOSIS — E876 Hypokalemia: Secondary | ICD-10-CM

## 2022-04-20 DIAGNOSIS — R7989 Other specified abnormal findings of blood chemistry: Secondary | ICD-10-CM | POA: Diagnosis not present

## 2022-04-20 DIAGNOSIS — C787 Secondary malignant neoplasm of liver and intrahepatic bile duct: Secondary | ICD-10-CM | POA: Diagnosis not present

## 2022-04-20 DIAGNOSIS — R112 Nausea with vomiting, unspecified: Secondary | ICD-10-CM | POA: Diagnosis not present

## 2022-04-20 DIAGNOSIS — D701 Agranulocytosis secondary to cancer chemotherapy: Secondary | ICD-10-CM | POA: Diagnosis not present

## 2022-04-20 DIAGNOSIS — Z803 Family history of malignant neoplasm of breast: Secondary | ICD-10-CM | POA: Diagnosis not present

## 2022-04-20 DIAGNOSIS — C259 Malignant neoplasm of pancreas, unspecified: Secondary | ICD-10-CM

## 2022-04-20 DIAGNOSIS — C250 Malignant neoplasm of head of pancreas: Secondary | ICD-10-CM | POA: Diagnosis not present

## 2022-04-20 DIAGNOSIS — Z5189 Encounter for other specified aftercare: Secondary | ICD-10-CM | POA: Diagnosis not present

## 2022-04-20 DIAGNOSIS — R197 Diarrhea, unspecified: Secondary | ICD-10-CM | POA: Diagnosis not present

## 2022-04-20 DIAGNOSIS — Z452 Encounter for adjustment and management of vascular access device: Secondary | ICD-10-CM | POA: Diagnosis not present

## 2022-04-20 DIAGNOSIS — D709 Neutropenia, unspecified: Secondary | ICD-10-CM | POA: Diagnosis not present

## 2022-04-20 LAB — CMP (CANCER CENTER ONLY)
ALT: 116 U/L — ABNORMAL HIGH (ref 0–44)
AST: 127 U/L — ABNORMAL HIGH (ref 15–41)
Albumin: 4.1 g/dL (ref 3.5–5.0)
Alkaline Phosphatase: 195 U/L — ABNORMAL HIGH (ref 38–126)
Anion gap: 7 (ref 5–15)
BUN: 9 mg/dL (ref 6–20)
CO2: 25 mmol/L (ref 22–32)
Calcium: 9 mg/dL (ref 8.9–10.3)
Chloride: 105 mmol/L (ref 98–111)
Creatinine: 0.54 mg/dL (ref 0.44–1.00)
GFR, Estimated: >60 mL/min (ref >60)
Glucose, Bld: 100 mg/dL — ABNORMAL HIGH (ref 70–99)
Potassium: 4.3 mmol/L (ref 3.5–5.1)
Sodium: 137 mmol/L (ref 135–145)
Total Bilirubin: 0.4 mg/dL (ref 0.3–1.2)
Total Protein: 7.6 g/dL (ref 6.5–8.1)

## 2022-04-20 LAB — CBC WITH DIFFERENTIAL (CANCER CENTER ONLY)
Abs Immature Granulocytes: 0.03 10*3/uL (ref 0.00–0.07)
Basophils Absolute: 0 10*3/uL (ref 0.0–0.1)
Basophils Relative: 1 %
Eosinophils Absolute: 0 10*3/uL (ref 0.0–0.5)
Eosinophils Relative: 1 %
HCT: 34.7 % — ABNORMAL LOW (ref 36.0–46.0)
Hemoglobin: 11.1 g/dL — ABNORMAL LOW (ref 12.0–15.0)
Immature Granulocytes: 1 %
Lymphocytes Relative: 33 %
Lymphs Abs: 1.2 10*3/uL (ref 0.7–4.0)
MCH: 28 pg (ref 26.0–34.0)
MCHC: 32 g/dL (ref 30.0–36.0)
MCV: 87.4 fL (ref 80.0–100.0)
Monocytes Absolute: 0.6 10*3/uL (ref 0.1–1.0)
Monocytes Relative: 17 %
Neutro Abs: 1.7 10*3/uL (ref 1.7–7.7)
Neutrophils Relative %: 47 %
Platelet Count: 200 10*3/uL (ref 150–400)
RBC: 3.97 MIL/uL (ref 3.87–5.11)
RDW: 14.1 % (ref 11.5–15.5)
WBC Count: 3.7 10*3/uL — ABNORMAL LOW (ref 4.0–10.5)
nRBC: 0 % (ref 0.0–0.2)

## 2022-04-20 MED ORDER — SODIUM CHLORIDE 0.9% FLUSH
10.0000 mL | Freq: Once | INTRAVENOUS | Status: AC
  Administered 2022-04-20: 10 mL

## 2022-04-20 MED ORDER — HEPARIN SOD (PORK) LOCK FLUSH 100 UNIT/ML IV SOLN
INTRAVENOUS | Status: AC
  Administered 2022-04-20: 500 [IU]
  Filled 2022-04-20: qty 5

## 2022-04-20 MED ORDER — LIDOCAINE HCL 1 % IJ SOLN
INTRAMUSCULAR | Status: AC
  Administered 2022-04-20: 3 mL via INTRADERMAL
  Filled 2022-04-20: qty 20

## 2022-04-20 NOTE — Progress Notes (Signed)
[place] Telephone:([phone]   [phone]  PROGRESS NOTE:  Patient Care Team: Robyne Peers, MD as PCP - General (Family Medicine) Gordy Levan, MD as Consulting Physician (Medical Oncology)  CHIEF COMPLAINTS/PURPOSE OF CONSULTATION:  Metastatic pancreatic adenocarcinoma involving the liver  ONCOLOGIC HISTORY: Presented with nausea and right upper quadrant pain 03/08/2022: Abdominal US showed multiple hypoechoic masses within the liver are indeterminate.  03/10/2022: MR abdomen: Hypoenhancing mass compatible with pancreatic head adenocarcinoma with numerous targetoid enhancing masses in all segments of the liver, compatible with metastatic disease. The mass severely effaces and nearly occludes the portal vein and splenic vein, and occludes the superior mesenteric vein with some collaterals noted. The mass substantially abuts the proximal transverse duodenum, although overt duodenal invasion is indeterminate. 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 Prominent stool throughout the colon favors constipation. Fluid-fluid level in the gallbladder likely from sludge. 03/14/2022: Establish care with [facility] Hematology/Oncology 03/17/2022: CT chest: no definitive evidence of lung metastases.  03/22/2022: Liver biopsy confirmed metastatic adenocarcinoma, pancreatic primary.  03/30/2022: Cycle 1, Day 1 of mFOLFIRINOX 04/14/2022: Cycle 2, Day 1 of mFOLFIRINOX HELD due to [place] 300.   HISTORY OF PRESENTING ILLNESS:  Kaitlin Ferrell presents today for a follow up before Cycle 2, Day 1 of mFOLFIRINOX.   Kaitlin Ferrell reports she  has been well overall interim since her last visit.  She reports that she is trying to avoid acetaminophen.  She notes that she has tried Percocet before in the past but it causes itching, whelps, and hives.  She reports that she is having some diarrhea but no nausea or vomiting.  She is willing to try a scopolamine patch as Zofran and Compazine did not help.  She also tried liquid Imodium without much relief.  She notes that she is eating well now though her appetite was poor previously.  Overall she is willing and able to proceed with treatment at this time.  She denies fevers, chills, sweats, shortness of breath, chest pain, cough, peripheral neuropathy or cold sensitivity. She has no other complaints. Rest of the 10 point ROS is below.   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   treated with 4 cycles of actinomycin D from 12-02-11 thru 01-13-12   H/O molar pregnancy, antepartum    Hypertension    Migraine    Miscarriage     SURGICAL HISTORY: Past Surgical History:  Procedure Laterality Date   BREAST BIOPSY  2001   Left   BREAST LUMPECTOMY     DILATION AND CURETTAGE OF UTERUS  1992, 09/2011   DILATION AND EVACUATION  2011   INSERTION OF MESH N/A 09/03/2013   Procedure: INSERTION OF MESH;  Surgeon: Adin Hector, MD;  Location: [facility];  Service: General;  Laterality: N/A;   IR CHEST FLUORO  04/14/2022   IR IMAGING GUIDED PORT INSERTION  03/22/2022   IR PORT REPAIR CENTRAL VENOUS ACCESS DEVICE  04/19/2022   IR US LIVER BIOPSY  03/22/2022   PORTACATH PLACEMENT  11/2011   PORTACATH PLACEMENT     REMOVAL  MARCH 2014   VENTRAL HERNIA REPAIR N/A 09/03/2013   Procedure: LAPAROSCOPIC VENTRAL WALL HERNIA REPAIR;  Surgeon: Adin Hector, MD;  Location: [facility];  Service: General;  Laterality: N/A;    WISDOM TOOTH EXTRACTION     in 11th grade    SOCIAL HISTORY: Social History   Socioeconomic History   Marital status: Married    Spouse name: [name]   Number of children: 2   Years of education: college   Highest education level: Not on file  Occupational History    Comment: [employer]  Tobacco Use   Smoking status: Never   Smokeless tobacco: Never  Substance and Sexual Activity   Alcohol use: Yes    Alcohol/week: 1.0 standard drink of alcohol    Types: 1 Glasses of wine per week    Comment: occas   Drug use: No   Sexual activity: Not Currently    Birth control/protection: Pill  Other Topics Concern   Not on file  Social History Narrative   Patient is married [name]). Patient works part time at [employer].   Right handed.   Patient has her CMA.   Caffeine- None         Social Determinants of Health   Financial Resource Strain: Not on file  Food Insecurity: No Food Insecurity (03/14/2022)   Hunger Vital Sign    Worried About Running Out of Food in the Last Year: Never true    Ran Out of Food in the Last Year: Never true  Transportation Needs: No Transportation Needs (03/14/2022)   PRAPARE - Hydrologist (Medical): No    Lack of Transportation (Non-Medical): No  Physical Activity: Not on file  Stress: Not on file  Social Connections: Not on file  Intimate Partner Violence: Not At Risk (03/14/2022)   Humiliation, Afraid, Rape, and Kick questionnaire    Fear of Current or Ex-Partner: No    Emotionally Abused: No    Physically Abused: No    Sexually Abused: No    FAMILY HISTORY: Family History  Problem Relation Age of Onset   Breast cancer Mother        dx > 7   Heart attack Mother    Diabetes Father    Breast cancer Maternal Aunt        dx > 63   Breast cancer Maternal Aunt        dx > 50   Breast cancer Maternal Grandmother        ? < 50   Stroke Paternal Grandmother    Alcoholism Other     ALLERGIES:  is allergic to  compazine [prochlorperazine edisylate], propofol, oxycodone-acetaminophen, prochlorperazine, and labetalol.  MEDICATIONS:  Current Outpatient Medications  Medication Sig Dispense Refill   amLODipine (NORVASC) 10 MG tablet Take 1 tablet by mouth daily.     APIXABAN (ELIQUIS) VTE STARTER PACK (10MG  AND 5MG ) Take as directed on package: start with two-5mg  tablets twice daily for 7 days. On day 8, switch to one-5mg  tablet twice daily. Start after liver biopsy and port placement. 1 each 0   diphenoxylate-atropine (LOMOTIL) 2.5-0.025 MG tablet Take 1 tablet by mouth 4 (four) times daily as needed for diarrhea or loose stools. 30 tablet 0   eletriptan (RELPAX) 40 MG tablet Take 1 tablet (40 mg total) by mouth as needed for migraine or headache. May repeat  in 2 hours if needed. 15 tablet 6   HYDROcodone-acetaminophen (NORCO) 10-325 MG tablet Take 1 tablet by mouth every 6 (six) hours as needed. 120 tablet 0   ibuprofen (ADVIL,MOTRIN) 200 MG tablet Take 400 mg by mouth every 6 (six) hours as needed.     lidocaine-prilocaine (EMLA) cream Apply 1 Application topically as needed. 30 g 0   magnesium oxide (MAG-OX) 400 (241.3 MG) MG tablet TAKE 1 TABLET BY MOUTH TWICE A DAY 60 tablet 6   morphine (MS CONTIN) 30 MG 12 hr tablet Take 1 tablet (30 mg total) by mouth every 12 (twelve) hours. 60 tablet 0   ondansetron (ZOFRAN) 8 MG tablet Take 1 tablet (8 mg total) by mouth every 8 (eight) hours as needed for nausea or vomiting. 90 tablet 0   Riboflavin (B2) 100 MG TABS Take 1 tablet by mouth 2 (two) times daily.  12   scopolamine (TRANSDERM-SCOP) 1 MG/3DAYS Place 1 patch (1.5 mg total) onto the skin every 3 (three) days. 10 patch 12   magic mouthwash (multi-ingredient) oral suspension Swish and swallow 5 mLs by mouth 4 times daily as needed for throat discomfort 400 mL 1   OLANZapine (ZYPREXA) 10 MG tablet TAKE 1 TABLET BY MOUTH EVERYDAY AT BEDTIME 90 tablet 1   No current facility-administered medications for  this visit.    REVIEW OF SYSTEMS:   Constitutional: ( - ) fevers, ( - )  chills , ( - ) night sweats Eyes: ( - ) blurriness of vision, ( - ) double vision, ( - ) watery eyes Ears, nose, mouth, throat, and face: ( - ) mucositis, ( - ) sore throat Respiratory: ( - ) cough, ( - ) dyspnea, ( - ) wheezes Cardiovascular: ( - ) palpitation, ( - ) chest discomfort, ( - ) lower extremity swelling Gastrointestinal:  (+) nausea, ( - ) heartburn, ( +) change in bowel habits Skin: ( - ) abnormal skin rashes Lymphatics: ( - ) new lymphadenopathy, ( - ) easy bruising Neurological: ( - ) numbness, ( - ) tingling, ( - ) new weaknesses Behavioral/Psych: ( - ) mood change, ( - ) new changes  All other systems were reviewed with the patient and are negative.  PHYSICAL EXAMINATION: ECOG PERFORMANCE STATUS: 1 - Symptomatic but completely ambulatory  Vitals:   04/20/22 1137  BP: (!) 145/79  Pulse: 77  Resp: 16  Temp: 98.1 F (36.7 C)  SpO2: 99%    Filed Weights   04/20/22 1137  Weight: 156 lb 3.2 oz (70.9 kg)    GENERAL: well appearing female in NAD  SKIN: skin color, texture, turgor are normal, no rashes or significant lesions EYES: conjunctiva are pink and non-injected, sclera clear LUNGS: clear to auscultation and percussion with normal breathing effort HEART: regular rate & rhythm and no murmurs and no lower extremity edema ABDOMEN:  normal bowel sounds. Abdomen is tender to palpation in epigastric and RUQ region. No palpable masses or hepatomegaly appreciated.  Musculoskeletal: no cyanosis of digits and no clubbing  PSYCH: alert & oriented x 3, fluent speech NEURO: no focal motor/sensory deficits  LABORATORY DATA:  I have reviewed the data as listed    Latest Ref Rng & Units 04/20/2022    3:09 PM 04/14/2022    8:20 AM 04/09/2022    3:54 PM  CBC  WBC 4.0 - 10.5 K/uL 3.7  1.4  2.0   Hemoglobin 12.0 - 15.0 g/dL 11.1  11.0  12.4   Hematocrit  36.0 - 46.0 % 34.7  33.2  38.9   Platelets 150  - 400 K/uL 200  134  101        Latest Ref Rng & Units 04/20/2022    3:09 PM 04/14/2022    8:20 AM 04/09/2022    3:54 PM  CMP  Glucose 70 - 99 mg/dL 100  119  100   BUN 6 - 20 mg/dL 9  9  12    Creatinine 0.44 - 1.00 mg/dL 0.54  0.70  0.76   Sodium 135 - 145 mmol/L 137  140  137   Potassium 3.5 - 5.1 mmol/L 4.3  3.2  3.5   Chloride 98 - 111 mmol/L 105  103  102   CO2 22 - 32 mmol/L 25  30  26    Calcium 8.9 - 10.3 mg/dL 9.0  9.4  9.3   Total Protein 6.5 - 8.1 g/dL 7.6  7.3  8.2   Total Bilirubin 0.3 - 1.2 mg/dL 0.4  0.4  0.5   Alkaline Phos 38 - 126 U/L 195  137  81   AST 15 - 41 U/L 127  85  22   ALT 0 - 44 U/L 116  87  24      RADIOGRAPHIC STUDIES: I have personally reviewed the radiological images as listed and agreed with the findings in the report. IR PICC PLACEMENT RIGHT >5 YRS INC IMG GUIDE  Result Date: 04/20/2022 INDICATION: History of metastatic pancreatic cancer on chemotherapy. Current port malfunction. Request PICC line placement for intravenous therapies. EXAM: ULTRASOUND AND FLUOROSCOPIC GUIDED RIGHT UPPER EXTREMITY PICC LINE INSERTION MEDICATIONS: 1% plain lidocaine, 1 mL CONTRAST:  None FLUOROSCOPY TIME:  Radiation exposure index: (2  MGy) COMPLICATIONS: None immediate. TECHNIQUE: The procedure, risks, benefits, and alternatives were explained to the patient and informed written consent was obtained. A timeout was performed prior to the initiation of the procedure. The right upper extremity was prepped with chlorhexidine in a sterile fashion, and a sterile drape was applied covering the operative field. Maximum barrier sterile technique with sterile gowns and gloves were used for the procedure. A timeout was performed prior to the initiation of the procedure. Local anesthesia was provided with 1% lidocaine. Under direct ultrasound guidance, the basilic vein was accessed with a micropuncture kit after the overlying soft tissues were anesthetized with 1% lidocaine. After the  overlying soft tissues were anesthetized, a small venotomy incision was created and a micropuncture kit was utilized to access the right basilic vein. Real-time ultrasound guidance was utilized for vascular access including the acquisition of a permanent ultrasound image documenting patency of the accessed vessel. A guidewire was advanced to the level of the superior caval-atrial junction for measurement purposes and the PICC line was cut to length. A peel-away sheath was placed and a 35 cm, 5 [place], dual lumen was inserted to level of the superior caval-atrial junction. A post procedure spot fluoroscopic was obtained. The catheter easily aspirated and flushed and was secured in place. A dressing was placed. The patient tolerated the procedure well without immediate post procedural complication. FINDINGS: After catheter placement, the tip lies within the superior cavoatrial junction. The catheter aspirates and flushes normally and is ready for immediate use. IMPRESSION: Successful ultrasound and fluoroscopic guided placement of a right basilic vein approach, 35 cm, 5 French, dual lumen PICC with tip at the superior caval-atrial junction. The PICC line is ready for immediate use. Read by: Ascencion Dike PA-C Electronically Signed   By:  M.  Shick M.D.   On: 04/20/2022 15:05   IR Port Repair Central Venous Access Device  Result Date: 04/19/2022 INDICATION: 57 year old female with question of port malfunction EXAM: IMAGE GUIDED PORT CATHETER REVISION MEDICATIONS: None. ANESTHESIA/SEDATION: Moderate (conscious) sedation was employed during this procedure. A total of Versed 2.0 mg and Fentanyl 100 mcg was administered intravenously by the radiology nurse. Total intra-service moderate Sedation Time: 31 minutes. The patient's level of consciousness and vital signs were monitored continuously by radiology nursing throughout the procedure under my direct supervision. FLUOROSCOPY: Radiation Exposure Index (as provided by  the fluoroscopic device): 0 mGy Kerma COMPLICATIONS: None PROCEDURE: The procedure, risks, benefits, and alternatives were explained to the patient. Questions regarding the procedure were encouraged and answered. The patient understands and consents to the procedure. The right neck and chest were prepped with chlorhexidine in a sterile fashion, and a sterile drape was applied covering the operative field. Maximum barrier sterile technique with sterile gowns and gloves were used for the procedure. A timeout was performed prior to the initiation of the procedure. Local anesthesia was provided with 1% lidocaine with epinephrine. Physical exam was performed, and initial image was stored. After initiation of moderate sedation, local anesthesia was administered at the right chest wall port catheter site. Incision was made along the prior scar after local anesthesia was applied. Blunt dissection was used to open the port pocket to observe the orientation of the port catheter. We observed that the prior retention suture was in place at the apex of the port and that the port was not inverted/rotated. There is a slight leftward orientation of the rubber septum. At this point using physical exam and manual palpation, a 1 inch Huber needle was used to access the port. Note that the 1 inch Huber needle is hub once the needle is in place in the port. Then, using a hemostat the hub of the port catheter was grasped and the port was rotated towards the patient's right side. Aspiration and flush was confirmed through the 1 inch Huber needle. 500 units of Hep-Lock in 5 cc was then administered. At this time a 0 Prolene suture was used to anchor the right corner of the port catheter to the soft tissues in the port pocket, for a more central orientation of the rubber septum. The original retention suture was left intact. The pocket was irrigated copiously with sterile saline. The port pocket incision was closed with interrupted 2-0  Vicryl suture and the skin was opposed with a running subcuticular 4-0 Vicryl suture. Dermabond was applied. Dressings were placed. The patient tolerated the procedure well without immediate post procedural complication. FINDINGS: Upon initial physical exam, the rubber septum is palpable under the skin surface, with the septum oriented slightly towards the patient's left shoulder. Upon opening the port catheter, the stay suture was confirmed to be intact, with the septum oriented slightly towards the patient's left shoulder. A more midline orientation was attempted with rotating the port in the pocket, with a second stay suture placed at the right apex. We confirmed access was capable through the skin service with a 1 inch Huber needle. The 1 inch Huber needle is hub at the skin surface. Perhaps further access may be easier with 1.25 inch or 1.5 inch Huber needle. Directing slightly towards the patient's right allows confident access. IMPRESSION: Status post image guided revision of right-sided IJ port catheter, with additional retention suture placed at the right apex, and function confirmed. Signed, Dulcy Fanny. Earleen Newport, DO,  ABVM, RPVI [facility] Electronically Signed   By: Corrie Mckusick D.O.   On: 04/19/2022 13:27   DG Chest 2 View  Result Date: 04/16/2022 CLINICAL DATA:  Evaluate port placement EXAM: CHEST - 2 VIEW COMPARISON:  Port placement images [id number] FINDINGS: Right IJ approach single-lumen power injectable port catheter. The catheter tip overlies the mid SVC. The port reservoir appears tilted to the side. Cardiac and mediastinal contours are normal. Atherosclerotic calcifications present in the transverse aorta. The lungs are clear. No acute osseous abnormality. IMPRESSION: 1. Right IJ approach single-lumen power injectable port catheter. The catheter tip overlies the mid SVC. 2. The port reservoir appears slightly tilted with the access site facing  somewhat anterolaterally rather than directly anterior. 3. No acute cardiopulmonary process. 4. Atherosclerotic calcifications in the aorta. Electronically Signed   By: Jacqulynn Cadet M.D.   On: 04/16/2022 17:21   IR Chest Fluoro  Result Date: 04/14/2022 INDICATION: Inability to access port catheter, unable to get infusion today. EXAM: Unsuccessful attempt at port catheter access under fluoroscopy MEDICATIONS: None. ANESTHESIA/SEDATION: None. FLUOROSCOPY: Radiation Exposure Index (as provided by the fluoroscopic device): 3.0 mGy Kerma COMPLICATIONS: None immediate. PROCEDURE: Informed written consent was obtained from the patient after a thorough discussion of the procedural risks, benefits and alternatives. All questions were addressed. Maximal Sterile Barrier Technique was utilized including caps, mask, sterile gowns, sterile gloves, sterile drape, hand hygiene and skin antiseptic. A timeout was performed prior to the initiation of the procedure. Under sterile conditions, fluoroscopic needle access was attempted of the right IJ power port catheter. Despite manual manipulation and fluoroscopic guidance, needle access within the port catheter cannot be obtained. Needle passes made unsuccessfully. By fluoroscopy, the port catheter position has changed compared to the original placement. IMPRESSION: 1. Unsuccessful fluoroscopic needle access of the right IJ power port catheter despite manual manipulation and fluoroscopic guidance. 2. Unable to access the port catheter under fluoroscopy. 3. Port catheter revision will be scheduled as an outpatient electively. Electronically Signed   By: Jerilynn Mages.  Shick M.D.   On: 04/14/2022 14:05    ASSESSMENT & PLAN Kaitlin Ferrell is a 57 y.o. female who returns to the clinic for newly diagnosed pancreatic adenocarcinoma.    #Stage IV Pancreatic adenocarcinoma involving the liver: --Liver biopsy on 03/22/2022 confirmed metastatic adenocarcinoma --Due to metastatic  involvement in the liver, patient is not a candidate for curative therapies including surgery --Discussed mainstay treatment will be chemotherapy. Dr. Lorenso Courier recommends chemotherapy regimen with FOLFIRINOX q 2 weeks.  --Plan to obtain restaging CT scans every 3 months to assess treatment response.  --[facility] Testing: MS-stable, 2 Muts/Mb, ATMH630fs*31, KRASG12L, MDM2 amplification, CDKN2A/B. No targetable mutations.  --Started mFOLFIRINOX on 03/30/2022 PLAN: --Due to start Cycle 2, Day 1 of FOLFIRINOX today --Labs  from today reviewed with patient. WBC 3.7, hemoglobin 11.1, MCV 87.4, and platelets of 200 --Treatment will be HELD due to neutropenia.  --RTC in 1 weeks with labs and follow up before Cycle 3, Day 1.   Port issues: --Flush was unable to access port today --Ordered chest xray to confirm port placement --IR will assess patient later today.   Neutropenia: --Due to critical levels with ANC 300, gave one dose of Zarzio 300 mcg in clinic at last visit.  --Defered treatment by one week and plan to give Udenyca on Day 3 starting with Cycle 2.  --Neutropenic precautions given to patient including monitoring for fevers.   Elevated LFTs: --ALT 116, AST  127 --Etiology includes chemotherapy induced, liver metastases versus increased acetaminophen use. --Monitor for now and advised to take 1 tablet of Norco every 6 hours as prescribed.  Hypokalemia: --Potassium level is 4.3. --Likely secondary to vomiting/diarrhea --Gave oral Potassium Chloride 40 mEq today  Nausea/Vomiting: --Secondary to chemotherapy -- Currently on zofran 8mg  q8H PRN for nausea, zyprexa nightly PRN for nausea. Encouraged patient to take zofran q 8 hours rather than once.  -- Sent scopolamine patch as well.  -- Has allergy to compazine and phernegan.   #Epigastric/RUQ/back pain: --Likely secondary to underlying malignancy --Tramadol was ineffective.  --Current pain regimen includes MS Contin 15 mg q 12  hours and Norco 5-325 mg for breakthrough pain. Pain is not well controlled so we will increase dose of MS contin to 30 mg q 12 hours and Norco 10-325 mg. Advised not to double or triple dose of Norco without provider instruction.    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 --Sent referral to [facility] nutrition team  #Family history of breast cancer: --Due to strong family history of cancer and has undergone genetic testing in the past. With the anticipated diagnosis of pancreatic cancer, we will make a referral to genetics to see if additional testing is required.   #Supportive Care -- chemotherapy education to be scheduled  -- port placement complete  -- EMLA cream for port   Orders Placed This Encounter  Procedures   IR PICC PLACEMENT RIGHT >5 YRS INC IMG GUIDE    Standing Status:   Future    Number of Occurrences:   1    Standing Expiration Date:   04/20/2023    Order Specific Question:   Reason for Exam (SYMPTOM  OR DIAGNOSIS REQUIRED)    Answer:   Pancreatic adenocarcinoma    Order Specific Question:   Is the patient pregnant?    Answer:   No    Order Specific Question:   Preferred Imaging Location?    Answer:   [facility]   IR Port Repair Central Venous Access Device    Standing Status:   Future    Standing Expiration Date:   04/20/2023    Order Specific Question:   Reason for Exam (SYMPTOM  OR DIAGNOSIS REQUIRED)    Answer:   Pancreatic cancer, current port not working    Order Specific Question:   Is the patient pregnant?    Answer:   No    Order Specific Question:   Preferred Imaging Location?    Answer:   [facility]   CBC with Differential ([place] Only)    Standing Status:   Future    Standing Expiration Date:   05/19/2023   CMP ([place] only)    Standing Status:    Future    Standing Expiration Date:   05/19/2023   CBC with Differential (Cancer Center Only)    Standing Status:   Future    Standing Expiration Date:   06/02/2023   CMP ([place] only)    Standing Status:   Future    Standing Expiration Date:   06/02/2023    All questions were answered. The patient knows to call the clinic with any problems, questions or concerns.  I have spent a total of 30 minutes minutes of face-to-face and non-face-to-face time, preparing to see the patient,  performing a medically appropriate examination,  counseling and educating the patient, documenting clinical information in the electronic health record,  and care coordination.   Ledell Peoples, MD Department of Hematology/Oncology [place] [facility] Phone: [phone] Pager: [phone] Email: [email or web address]

## 2022-04-20 NOTE — Progress Notes (Signed)
Patient to have PICC line placed later today, she was instructed to eat lunch & then check in at [facility] for procedure.  She verbalizes understanding.  Patient seen by MD today  Vitals are within treatment parameters.  Labs reviewed: liver enzymes elevated, please proceed with treatment per Dr Lorenso Courier  Per physician team, patient is ready for treatment and there are NO modifications to the treatment plan.   Patient's PICC line to be used until [facility] replaced.

## 2022-04-20 NOTE — Procedures (Signed)
Successful placement of dual lumen PICC line to right basilic vein. Length 35 cm Tip at lower SVC/RA PICC capped No complications Ready for use.  EBL < 5 mL   Ascencion Dike PA-C 04/20/2022 3:01 PM

## 2022-04-21 ENCOUNTER — Inpatient Hospital Stay: Payer: BC Managed Care – PPO

## 2022-04-21 ENCOUNTER — Telehealth: Payer: Self-pay | Admitting: Licensed Clinical Social Worker

## 2022-04-21 VITALS — BP 146/78 | HR 71 | Temp 98.2°F | Resp 16

## 2022-04-21 DIAGNOSIS — D709 Neutropenia, unspecified: Secondary | ICD-10-CM | POA: Diagnosis not present

## 2022-04-21 DIAGNOSIS — M549 Dorsalgia, unspecified: Secondary | ICD-10-CM | POA: Diagnosis not present

## 2022-04-21 DIAGNOSIS — D701 Agranulocytosis secondary to cancer chemotherapy: Secondary | ICD-10-CM | POA: Diagnosis not present

## 2022-04-21 DIAGNOSIS — C259 Malignant neoplasm of pancreas, unspecified: Secondary | ICD-10-CM | POA: Diagnosis not present

## 2022-04-21 DIAGNOSIS — Z803 Family history of malignant neoplasm of breast: Secondary | ICD-10-CM | POA: Diagnosis not present

## 2022-04-21 DIAGNOSIS — Z452 Encounter for adjustment and management of vascular access device: Secondary | ICD-10-CM | POA: Diagnosis not present

## 2022-04-21 DIAGNOSIS — R7989 Other specified abnormal findings of blood chemistry: Secondary | ICD-10-CM | POA: Diagnosis not present

## 2022-04-21 DIAGNOSIS — Z5189 Encounter for other specified aftercare: Secondary | ICD-10-CM | POA: Diagnosis not present

## 2022-04-21 DIAGNOSIS — R112 Nausea with vomiting, unspecified: Secondary | ICD-10-CM | POA: Diagnosis not present

## 2022-04-21 DIAGNOSIS — C787 Secondary malignant neoplasm of liver and intrahepatic bile duct: Secondary | ICD-10-CM | POA: Diagnosis not present

## 2022-04-21 DIAGNOSIS — E876 Hypokalemia: Secondary | ICD-10-CM | POA: Diagnosis not present

## 2022-04-21 DIAGNOSIS — R197 Diarrhea, unspecified: Secondary | ICD-10-CM | POA: Diagnosis not present

## 2022-04-21 DIAGNOSIS — Z95828 Presence of other vascular implants and grafts: Secondary | ICD-10-CM | POA: Diagnosis not present

## 2022-04-21 DIAGNOSIS — Z5111 Encounter for antineoplastic chemotherapy: Secondary | ICD-10-CM | POA: Diagnosis not present

## 2022-04-21 DIAGNOSIS — C250 Malignant neoplasm of head of pancreas: Secondary | ICD-10-CM | POA: Diagnosis not present

## 2022-04-21 MED ORDER — SODIUM CHLORIDE 0.9 % IV SOLN
2400.0000 mg/m2 | INTRAVENOUS | Status: DC
  Administered 2022-04-21: 4250 mg via INTRAVENOUS
  Filled 2022-04-21: qty 85

## 2022-04-21 MED ORDER — SODIUM CHLORIDE 0.9 % IV SOLN
400.0000 mg/m2 | Freq: Once | INTRAVENOUS | Status: AC
  Administered 2022-04-21: 712 mg via INTRAVENOUS
  Filled 2022-04-21: qty 35.6

## 2022-04-21 MED ORDER — DEXTROSE 5 % IV SOLN: Freq: Once | INTRAVENOUS | Status: AC

## 2022-04-21 MED ORDER — SODIUM CHLORIDE 0.9 % IV SOLN
150.0000 mg/m2 | Freq: Once | INTRAVENOUS | Status: AC
  Administered 2022-04-21: 260 mg via INTRAVENOUS
  Filled 2022-04-21: qty 5

## 2022-04-21 MED ORDER — PALONOSETRON HCL INJECTION 0.25 MG/5ML
0.2500 mg | Freq: Once | INTRAVENOUS | Status: AC
  Administered 2022-04-21: 0.25 mg via INTRAVENOUS
  Filled 2022-04-21: qty 5

## 2022-04-21 MED ORDER — OXALIPLATIN CHEMO INJECTION 100 MG/20ML
85.0000 mg/m2 | Freq: Once | INTRAVENOUS | Status: AC
  Administered 2022-04-21: 150 mg via INTRAVENOUS
  Filled 2022-04-21: qty 20

## 2022-04-21 MED ORDER — SODIUM CHLORIDE 0.9 % IV SOLN
10.0000 mg | Freq: Once | INTRAVENOUS | Status: AC
  Administered 2022-04-21: 10 mg via INTRAVENOUS
  Filled 2022-04-21: qty 1

## 2022-04-21 MED ORDER — ATROPINE SULFATE 1 MG/ML IV SOLN
0.5000 mg | Freq: Once | INTRAVENOUS | Status: AC | PRN
  Administered 2022-04-21: 0.5 mg via INTRAVENOUS
  Filled 2022-04-21: qty 1

## 2022-04-21 MED ORDER — SODIUM CHLORIDE 0.9 % IV SOLN
150.0000 mg | Freq: Once | INTRAVENOUS | Status: AC
  Administered 2022-04-21: 150 mg via INTRAVENOUS
  Filled 2022-04-21: qty 5

## 2022-04-21 NOTE — Patient Instructions (Addendum)
[place]   The chemotherapy medication bag should finish at 46 hours, 96 hours, or 7 days. For example, if your pump is scheduled for 46 hours and it was put on at 4:00 p.m., it should finish at 2:00 p.m. the day it is scheduled to come off regardless of your appointment time.     Estimated time to finish at 1:00 Saturday, April 23, 2022.   If the display on your pump reads "Low Volume" and it is beeping, take the batteries out of the pump and come to the cancer center for it to be taken off.   If the pump alarms go off prior to the pump reading "Low Volume" then call [phone] and someone can assist you.  If the plunger comes out and the chemotherapy medication is leaking out, please use your home chemo spill kit to clean up the spill. Do NOT use paper towels or other household products.  If you have problems or questions regarding your pump, please call either [phone] (24 hours a day) or the cancer center Monday-Friday 8:00 a.m.- 4:30 p.m. at the clinic number and we will assist you. If you are unable to get assistance, then go to the nearest Emergency Department and ask the staff to contact the IV team for assistance.  Discharge Instructions: Thank you for choosing [facility] to provide your oncology and hematology care.   If you have a lab appointment with the [facility], please go directly to the [facility] and check in at the registration area.   Wear comfortable clothing and clothing appropriate for easy access to any Portacath or PICC line.   We strive to give you quality time with your provider. You may need to reschedule your appointment if you arrive late (15 or more minutes).  Arriving late affects you and other patients whose appointments are after yours.  Also, if you miss three or more appointments without notifying the office, you may be dismissed from the clinic at the provider's discretion.      For  prescription refill requests, have your pharmacy contact our office and allow 72 hours for refills to be completed.    Today you received the following chemotherapy and/or immunotherapy agents Oxaliplatin, Leucovorin, Irinotecan,  Flurouracil.      To help prevent nausea and vomiting after your treatment, we encourage you to take your nausea medication as directed.  BELOW ARE SYMPTOMS THAT SHOULD BE REPORTED IMMEDIATELY: *FEVER GREATER THAN 100.4 F (38 C) OR HIGHER *CHILLS OR SWEATING *NAUSEA AND VOMITING THAT IS NOT CONTROLLED WITH YOUR NAUSEA MEDICATION *UNUSUAL SHORTNESS OF BREATH *UNUSUAL BRUISING OR BLEEDING *URINARY PROBLEMS (pain or burning when urinating, or frequent urination) *BOWEL PROBLEMS (unusual diarrhea, constipation, pain near the anus) TENDERNESS IN MOUTH AND THROAT WITH OR WITHOUT PRESENCE OF ULCERS (sore throat, sores in mouth, or a toothache) UNUSUAL RASH, SWELLING OR PAIN  UNUSUAL VAGINAL DISCHARGE OR ITCHING   Items with * indicate a potential emergency and should be followed up as soon as possible or go to the Emergency Department if any problems should occur.  Please show the CHEMOTHERAPY ALERT CARD or IMMUNOTHERAPY ALERT CARD at check-in to the Emergency Department and triage nurse.  Should you have questions after your visit or need to cancel or reschedule your appointment, please contact [facility]  Dept: [phone]  and follow the prompts.  Office hours are 8:00 a.m. to 4:30 p.m. Monday - Friday.  Please note that voicemails left after 4:00 p.m. may not be returned until the following business day.  We are closed weekends and major holidays. You have access to a nurse at all times for urgent questions. Please call the main number to the clinic Dept: [phone] and follow the prompts.   For any non-urgent questions, you may also contact your provider using MyChart. We now offer e-Visits for anyone 3 and older to request  care online for non-urgent symptoms. For details visit [email or web address].   Also download the MyChart app! Go to the app store, search "MyChart", open the app, select [facility], and log in with your MyChart username and password.  Oxaliplatin Injection What is this medication? OXALIPLATIN (ox AL i PLA tin) treats colorectal cancer. It works by slowing down the growth of cancer cells. This medicine may be used for other purposes; ask your health care provider or pharmacist if you have questions. COMMON BRAND NAME(S): Eloxatin What should I tell my care team before I take this medication? They need to know if you have any of these conditions: Heart disease History of irregular heartbeat or rhythm Liver disease Low blood cell levels (white cells, red cells, and platelets) Lung or breathing disease, such as asthma Take medications that treat or prevent blood clots Tingling of the fingers, toes, or other nerve disorder An unusual or allergic reaction to oxaliplatin, other medications, foods, dyes, or preservatives If you or your partner are pregnant or trying to get pregnant Breast-feeding How should I use this medication? This medication is injected into a vein. It is given by your care team in a hospital or clinic setting. Talk to your care team about the use of this medication in children. Special care may be needed. Overdosage: If you think you have taken too much of this medicine contact a poison control center or emergency room at once. NOTE: This medicine is only for you. Do not share this medicine with others. What if I miss a dose? Keep appointments for follow-up doses. It is important not to miss a dose. Call your care team if you are unable to keep an appointment. What may interact with this medication? Do not take this medication with any of the following: Cisapride Dronedarone Pimozide Thioridazine This medication may also interact with the following: Aspirin and  aspirin-like medications Certain medications that treat or prevent blood clots, such as warfarin, apixaban, dabigatran, and rivaroxaban Cisplatin Cyclosporine Diuretics Medications for infection, such as acyclovir, adefovir, amphotericin B, bacitracin, cidofovir, foscarnet, ganciclovir, gentamicin, pentamidine, vancomycin NSAIDs, medications for pain and inflammation, such as ibuprofen or naproxen Other medications that cause heart rhythm changes Pamidronate Zoledronic acid This list may not describe all possible interactions. Give your health care provider a list of all the medicines, herbs, non-prescription drugs, or dietary supplements you use. Also tell them if you smoke, drink alcohol, or use illegal drugs. Some items may interact with your medicine. What should I watch for while using this medication? Your condition will be monitored carefully while you are receiving this medication. You may need blood work while taking this medication. This medication may make you feel generally unwell. This is not uncommon as chemotherapy can affect healthy cells as well as cancer cells. Report any side effects. Continue your course of treatment even though you feel ill unless your care team tells you to stop. This medication may increase your risk of getting an infection. Call your care team for advice if you get a fever, chills, sore  throat, or other symptoms of a cold or flu. Do not treat yourself. Try to avoid being around people who are sick. Avoid taking medications that contain aspirin, acetaminophen, ibuprofen, naproxen, or ketoprofen unless instructed by your care team. These medications may hide a fever. Be careful brushing or flossing your teeth or using a toothpick because you may get an infection or bleed more easily. If you have any dental work done, tell your dentist you are receiving this medication. This medication can make you more sensitive to cold. Do not drink cold drinks or use ice.  Cover exposed skin before coming in contact with cold temperatures or cold objects. When out in cold weather wear warm clothing and cover your mouth and nose to warm the air that goes into your lungs. Tell your care team if you get sensitive to the cold. Talk to your care team if you or your partner are pregnant or think either of you might be pregnant. This medication can cause serious birth defects if taken during pregnancy and for 9 months after the last dose. A negative pregnancy test is required before starting this medication. A reliable form of contraception is recommended while taking this medication and for 9 months after the last dose. Talk to your care team about effective forms of contraception. Do not father a child while taking this medication and for 6 months after the last dose. Use a condom while having sex during this time period. Do not breastfeed while taking this medication and for 3 months after the last dose. This medication may cause infertility. Talk to your care team if you are concerned about your fertility. What side effects may I notice from receiving this medication? Side effects that you should report to your care team as soon as possible: Allergic reactions--skin rash, itching, hives, swelling of the face, lips, tongue, or throat Bleeding--bloody or black, tar-like stools, vomiting blood or brown material that looks like coffee grounds, red or dark brown urine, small red or purple spots on skin, unusual bruising or bleeding Dry cough, shortness of breath or trouble breathing Heart rhythm changes--fast or irregular heartbeat, dizziness, feeling faint or lightheaded, chest pain, trouble breathing Infection--fever, chills, cough, sore throat, wounds that don't heal, pain or trouble when passing urine, general feeling of discomfort or being unwell Liver injury--right upper belly pain, loss of appetite, nausea, light-colored stool, dark yellow or brown urine, yellowing skin or  eyes, unusual weakness or fatigue Low red blood cell level--unusual weakness or fatigue, dizziness, headache, trouble breathing Muscle injury--unusual weakness or fatigue, muscle pain, dark yellow or brown urine, decrease in amount of urine Pain, tingling, or numbness in the hands or feet Sudden and severe headache, confusion, change in vision, seizures, which may be signs of posterior reversible encephalopathy syndrome (PRES) Unusual bruising or bleeding Side effects that usually do not require medical attention (report to your care team if they continue or are bothersome): Diarrhea Nausea Pain, redness, or swelling with sores inside the mouth or throat Unusual weakness or fatigue Vomiting This list may not describe all possible side effects. Call your doctor for medical advice about side effects. You may report side effects to FDA at 1-800-FDA-1088. Where should I keep my medication? This medication is given in a hospital or clinic. It will not be stored at home. NOTE: This sheet is a summary. It may not cover all possible information. If you have questions about this medicine, talk to your doctor, pharmacist, or health care provider.  2023  Elsevier/Gold Standard (2007-03-17 00:00:00)  Leucovorin Injection What is this medication? LEUCOVORIN (loo koe VOR in) prevents side effects from certain medications, such as methotrexate. It works by increasing folate levels. This helps protect healthy cells in your body. It may also be used to treat anemia caused by low levels of folate. It can also be used with fluorouracil, a type of chemotherapy, to treat colorectal cancer. It works by increasing the effects of fluorouracil in the body. This medicine may be used for other purposes; ask your health care provider or pharmacist if you have questions. What should I tell my care team before I take this medication? They need to know if you have any of these conditions: Anemia from low levels of vitamin  B12 in the blood An unusual or allergic reaction to leucovorin, folic acid, other medications, foods, dyes, or preservatives Pregnant or trying to get pregnant Breastfeeding How should I use this medication? This medication is injected into a vein or a muscle. It is given by your care team in a hospital or clinic setting. Talk to your care team about the use of this medication in children. Special care may be needed. Overdosage: If you think you have taken too much of this medicine contact a poison control center or emergency room at once. NOTE: This medicine is only for you. Do not share this medicine with others. What if I miss a dose? Keep appointments for follow-up doses. It is important not to miss your dose. Call your care team if you are unable to keep an appointment. What may interact with this medication? Capecitabine Fluorouracil Phenobarbital Phenytoin Primidone Trimethoprim;sulfamethoxazole This list may not describe all possible interactions. Give your health care provider a list of all the medicines, herbs, non-prescription drugs, or dietary supplements you use. Also tell them if you smoke, drink alcohol, or use illegal drugs. Some items may interact with your medicine. What should I watch for while using this medication? Your condition will be monitored carefully while you are receiving this medication. This medication may increase the side effects of 5-fluorouracil. Tell your care team if you have diarrhea or mouth sores that do not get better or that get worse. What side effects may I notice from receiving this medication? Side effects that you should report to your care team as soon as possible: Allergic reactions--skin rash, itching, hives, swelling of the face, lips, tongue, or throat This list may not describe all possible side effects. Call your doctor for medical advice about side effects. You may report side effects to FDA at 1-800-FDA-1088. Where should I keep my  medication? This medication is given in a hospital or clinic. It will not be stored at home. NOTE: This sheet is a summary. It may not cover all possible information. If you have questions about this medicine, talk to your doctor, pharmacist, or health care provider.  2023 Elsevier/Gold Standard (2021-06-04 00:00:00)  Irinotecan Injection What is this medication? IRINOTECAN (ir in oh TEE kan) treats some types of cancer. It works by slowing down the growth of cancer cells. This medicine may be used for other purposes; ask your health care provider or pharmacist if you have questions. COMMON BRAND NAME(S): Camptosar What should I tell my care team before I take this medication? They need to know if you have any of these conditions: Dehydration Diarrhea Infection, especially a viral infection, such as chickenpox, cold sores, herpes Liver disease Low blood cell levels (white cells, red cells, and platelets) Low levels of  electrolytes, such as calcium, magnesium, or potassium in your blood Recent or ongoing radiation An unusual or allergic reaction to irinotecan, other medications, foods, dyes, or preservatives If you or your partner are pregnant or trying to get pregnant Breast-feeding How should I use this medication? This medication is injected into a vein. It is given by your care team in a hospital or clinic setting. Talk to your care team about the use of this medication in children. Special care may be needed. Overdosage: If you think you have taken too much of this medicine contact a poison control center or emergency room at once. NOTE: This medicine is only for you. Do not share this medicine with others. What if I miss a dose? Keep appointments for follow-up doses. It is important not to miss your dose. Call your care team if you are unable to keep an appointment. What may interact with this medication? Do not take this medication with any of the  following: Cobicistat Itraconazole This medication may also interact with the following: Certain antibiotics, such as clarithromycin, rifampin, rifabutin Certain antivirals for HIV or AIDS Certain medications for fungal infections, such as ketoconazole, posaconazole, voriconazole Certain medications for seizures, such as carbamazepine, phenobarbital, phenytoin Gemfibrozil Nefazodone St. John's wort This list may not describe all possible interactions. Give your health care provider a list of all the medicines, herbs, non-prescription drugs, or dietary supplements you use. Also tell them if you smoke, drink alcohol, or use illegal drugs. Some items may interact with your medicine. What should I watch for while using this medication? Your condition will be monitored carefully while you are receiving this medication. You may need blood work while taking this medication. This medication may make you feel generally unwell. This is not uncommon as chemotherapy can affect healthy cells as well as cancer cells. Report any side effects. Continue your course of treatment even though you feel ill unless your care team tells you to stop. This medication can cause serious side effects. To reduce the risk, your care team may give you other medications to take before receiving this one. Be sure to follow the directions from your care team. This medication may affect your coordination, reaction time, or judgement. Do not drive or operate machinery until you know how this medication affects you. Sit up or stand slowly to reduce the risk of dizzy or fainting spells. Drinking alcohol with this medication can increase the risk of these side effects. This medication may increase your risk of getting an infection. Call your care team for advice if you get a fever, chills, sore throat, or other symptoms of a cold or flu. Do not treat yourself. Try to avoid being around people who are sick. Avoid taking medications that  contain aspirin, acetaminophen, ibuprofen, naproxen, or ketoprofen unless instructed by your care team. These medications may hide a fever. This medication may increase your risk to bruise or bleed. Call your care team if you notice any unusual bleeding. Be careful brushing or flossing your teeth or using a toothpick because you may get an infection or bleed more easily. If you have any dental work done, tell your dentist you are receiving this medication. Talk to your care team if you or your partner are pregnant or think either of you might be pregnant. This medication can cause serious birth defects if taken during pregnancy and for 6 months after the last dose. You will need a negative pregnancy test before starting this medication. Contraception is recommended  while taking this medication and for 6 months after the last dose. Your care team can help you find the option that works for you. Do not father a child while taking this medication and for 3 months after the last dose. Use a condom for contraception during this time period. Do not breastfeed while taking this medication and for 7 days after the last dose. This medication may cause infertility. Talk to your care team if you are concerned about your fertility. What side effects may I notice from receiving this medication? Side effects that you should report to your care team as soon as possible: Allergic reactions--skin rash, itching, hives, swelling of the face, lips, tongue, or throat Dry cough, shortness of breath or trouble breathing Increased saliva or tears, increased sweating, stomach cramping, diarrhea, small pupils, unusual weakness or fatigue, slow heartbeat Infection--fever, chills, cough, sore throat, wounds that don't heal, pain or trouble when passing urine, general feeling of discomfort or being unwell Kidney injury--decrease in the amount of urine, swelling of the ankles, hands, or feet Low red blood cell level--unusual  weakness or fatigue, dizziness, headache, trouble breathing Severe or prolonged diarrhea Unusual bruising or bleeding Side effects that usually do not require medical attention (report to your care team if they continue or are bothersome): Constipation Diarrhea Hair loss Loss of appetite Nausea Stomach pain This list may not describe all possible side effects. Call your doctor for medical advice about side effects. You may report side effects to FDA at 1-800-FDA-1088. Where should I keep my medication? This medication is given in a hospital or clinic. It will not be stored at home. NOTE: This sheet is a summary. It may not cover all possible information. If you have questions about this medicine, talk to your doctor, pharmacist, or health care provider.  2023 Elsevier/Gold Standard (2021-06-03 00:00:00)  Fluorouracil Injection What is this medication? FLUOROURACIL (flure oh YOOR a sil) treats some types of cancer. It works by slowing down the growth of cancer cells. This medicine may be used for other purposes; ask your health care provider or pharmacist if you have questions. COMMON BRAND NAME(S): Adrucil What should I tell my care team before I take this medication? They need to know if you have any of these conditions: Blood disorders Dihydropyrimidine dehydrogenase (DPD) deficiency Infection, such as chickenpox, cold sores, herpes Kidney disease Liver disease Poor nutrition Recent or ongoing radiation therapy An unusual or allergic reaction to fluorouracil, other medications, foods, dyes, or preservatives If you or your partner are pregnant or trying to get pregnant Breast-feeding How should I use this medication? This medication is injected into a vein. It is administered by your care team in a hospital or clinic setting. Talk to your care team about the use of this medication in children. Special care may be needed. Overdosage: If you think you have taken too much of this  medicine contact a poison control center or emergency room at once. NOTE: This medicine is only for you. Do not share this medicine with others. What if I miss a dose? Keep appointments for follow-up doses. It is important not to miss your dose. Call your care team if you are unable to keep an appointment. What may interact with this medication? Do not take this medication with any of the following: Live virus vaccines This medication may also interact with the following: Medications that treat or prevent blood clots, such as warfarin, enoxaparin, dalteparin This list may not describe all possible interactions. Give  your health care provider a list of all the medicines, herbs, non-prescription drugs, or dietary supplements you use. Also tell them if you smoke, drink alcohol, or use illegal drugs. Some items may interact with your medicine. What should I watch for while using this medication? Your condition will be monitored carefully while you are receiving this medication. This medication may make you feel generally unwell. This is not uncommon as chemotherapy can affect healthy cells as well as cancer cells. Report any side effects. Continue your course of treatment even though you feel ill unless your care team tells you to stop. In some cases, you may be given additional medications to help with side effects. Follow all directions for their use. This medication may increase your risk of getting an infection. Call your care team for advice if you get a fever, chills, sore throat, or other symptoms of a cold or flu. Do not treat yourself. Try to avoid being around people who are sick. This medication may increase your risk to bruise or bleed. Call your care team if you notice any unusual bleeding. Be careful brushing or flossing your teeth or using a toothpick because you may get an infection or bleed more easily. If you have any dental work done, tell your dentist you are receiving this  medication. Avoid taking medications that contain aspirin, acetaminophen, ibuprofen, naproxen, or ketoprofen unless instructed by your care team. These medications may hide a fever. Do not treat diarrhea with over the counter products. Contact your care team if you have diarrhea that lasts more than 2 days or if it is severe and watery. This medication can make you more sensitive to the sun. Keep out of the sun. If you cannot avoid being in the sun, wear protective clothing and sunscreen. Do not use sun lamps, tanning beds, or tanning booths. Talk to your care team if you or your partner wish to become pregnant or think you might be pregnant. This medication can cause serious birth defects if taken during pregnancy and for 3 months after the last dose. A reliable form of contraception is recommended while taking this medication and for 3 months after the last dose. Talk to your care team about effective forms of contraception. Do not father a child while taking this medication and for 3 months after the last dose. Use a condom while having sex during this time period. Do not breastfeed while taking this medication. This medication may cause infertility. Talk to your care team if you are concerned about your fertility. What side effects may I notice from receiving this medication? Side effects that you should report to your care team as soon as possible: Allergic reactions--skin rash, itching, hives, swelling of the face, lips, tongue, or throat Heart attack--pain or tightness in the chest, shoulders, arms, or jaw, nausea, shortness of breath, cold or clammy skin, feeling faint or lightheaded Heart failure--shortness of breath, swelling of the ankles, feet, or hands, sudden weight gain, unusual weakness or fatigue Heart rhythm changes--fast or irregular heartbeat, dizziness, feeling faint or lightheaded, chest pain, trouble breathing High ammonia level--unusual weakness or fatigue, confusion, loss of  appetite, nausea, vomiting, seizures Infection--fever, chills, cough, sore throat, wounds that don't heal, pain or trouble when passing urine, general feeling of discomfort or being unwell Low red blood cell level--unusual weakness or fatigue, dizziness, headache, trouble breathing Pain, tingling, or numbness in the hands or feet, muscle weakness, change in vision, confusion or trouble speaking, loss of balance or coordination, trouble  walking, seizures Redness, swelling, and blistering of the skin over hands and feet Severe or prolonged diarrhea Unusual bruising or bleeding Side effects that usually do not require medical attention (report to your care team if they continue or are bothersome): Dry skin Headache Increased tears Nausea Pain, redness, or swelling with sores inside the mouth or throat Sensitivity to light Vomiting This list may not describe all possible side effects. Call your doctor for medical advice about side effects. You may report side effects to FDA at 1-800-FDA-1088. Where should I keep my medication? This medication is given in a hospital or clinic. It will not be stored at home. NOTE: This sheet is a summary. It may not cover all possible information. If you have questions about this medicine, talk to your doctor, pharmacist, or health care provider.  2023 Elsevier/Gold Standard (2021-05-25 00:00:00)

## 2022-04-21 NOTE — Telephone Encounter (Signed)
Left voicemail for Kaitlin Ferrell that full panel is back and it is good news. She can call me back at [phone] to review results.

## 2022-04-21 NOTE — Progress Notes (Signed)
Pt started to complain of stomach cramps. Atropine Given.

## 2022-04-23 ENCOUNTER — Inpatient Hospital Stay: Payer: BC Managed Care – PPO

## 2022-04-23 ENCOUNTER — Encounter: Payer: Self-pay | Admitting: Physician Assistant

## 2022-04-23 VITALS — BP 150/74 | HR 83 | Temp 97.0°F | Resp 14

## 2022-04-23 DIAGNOSIS — C259 Malignant neoplasm of pancreas, unspecified: Secondary | ICD-10-CM

## 2022-04-23 DIAGNOSIS — Z5189 Encounter for other specified aftercare: Secondary | ICD-10-CM | POA: Diagnosis not present

## 2022-04-23 DIAGNOSIS — Z5111 Encounter for antineoplastic chemotherapy: Secondary | ICD-10-CM | POA: Diagnosis not present

## 2022-04-23 DIAGNOSIS — R7989 Other specified abnormal findings of blood chemistry: Secondary | ICD-10-CM | POA: Diagnosis not present

## 2022-04-23 DIAGNOSIS — Z452 Encounter for adjustment and management of vascular access device: Secondary | ICD-10-CM | POA: Diagnosis not present

## 2022-04-23 DIAGNOSIS — Z95828 Presence of other vascular implants and grafts: Secondary | ICD-10-CM | POA: Diagnosis not present

## 2022-04-23 DIAGNOSIS — D709 Neutropenia, unspecified: Secondary | ICD-10-CM | POA: Diagnosis not present

## 2022-04-23 DIAGNOSIS — R112 Nausea with vomiting, unspecified: Secondary | ICD-10-CM | POA: Diagnosis not present

## 2022-04-23 DIAGNOSIS — R197 Diarrhea, unspecified: Secondary | ICD-10-CM | POA: Diagnosis not present

## 2022-04-23 DIAGNOSIS — Z803 Family history of malignant neoplasm of breast: Secondary | ICD-10-CM | POA: Diagnosis not present

## 2022-04-23 DIAGNOSIS — C787 Secondary malignant neoplasm of liver and intrahepatic bile duct: Secondary | ICD-10-CM | POA: Diagnosis not present

## 2022-04-23 DIAGNOSIS — M549 Dorsalgia, unspecified: Secondary | ICD-10-CM | POA: Diagnosis not present

## 2022-04-23 DIAGNOSIS — D701 Agranulocytosis secondary to cancer chemotherapy: Secondary | ICD-10-CM | POA: Diagnosis not present

## 2022-04-23 DIAGNOSIS — E876 Hypokalemia: Secondary | ICD-10-CM | POA: Diagnosis not present

## 2022-04-23 DIAGNOSIS — C250 Malignant neoplasm of head of pancreas: Secondary | ICD-10-CM | POA: Diagnosis not present

## 2022-04-23 MED ORDER — PEGFILGRASTIM-CBQV 6 MG/0.6ML ~~LOC~~ SOSY
6.0000 mg | PREFILLED_SYRINGE | Freq: Once | SUBCUTANEOUS | Status: AC
  Administered 2022-04-23: 6 mg via SUBCUTANEOUS

## 2022-04-23 MED ORDER — HEPARIN SOD (PORK) LOCK FLUSH 100 UNIT/ML IV SOLN
500.0000 [IU] | Freq: Once | INTRAVENOUS | Status: AC | PRN
  Administered 2022-04-23: 500 [IU]

## 2022-04-23 MED ORDER — SODIUM CHLORIDE 0.9% FLUSH
10.0000 mL | INTRAVENOUS | Status: DC | PRN
  Administered 2022-04-23: 10 mL

## 2022-04-25 ENCOUNTER — Other Ambulatory Visit: Payer: Self-pay

## 2022-04-25 ENCOUNTER — Ambulatory Visit: Payer: BC Managed Care – PPO

## 2022-04-25 ENCOUNTER — Other Ambulatory Visit: Payer: Self-pay | Admitting: Physician Assistant

## 2022-04-25 ENCOUNTER — Telehealth: Payer: Self-pay | Admitting: Genetic Counselor

## 2022-04-25 ENCOUNTER — Telehealth: Payer: Self-pay | Admitting: Hematology and Oncology

## 2022-04-25 DIAGNOSIS — C259 Malignant neoplasm of pancreas, unspecified: Secondary | ICD-10-CM

## 2022-04-25 NOTE — Telephone Encounter (Signed)
Called patient per 3/18 IB message to reschedule missed 3/18 appointment. Patient rescheduled and notified.

## 2022-04-25 NOTE — Telephone Encounter (Signed)
LM on VM that results were back and to please call.  Left CB instructions. 

## 2022-04-26 ENCOUNTER — Inpatient Hospital Stay: Payer: BC Managed Care – PPO

## 2022-04-26 ENCOUNTER — Other Ambulatory Visit (HOSPITAL_COMMUNITY): Payer: Self-pay

## 2022-04-26 ENCOUNTER — Telehealth: Payer: Self-pay

## 2022-04-26 ENCOUNTER — Encounter: Payer: Self-pay | Admitting: Genetic Counselor

## 2022-04-26 ENCOUNTER — Encounter: Payer: Self-pay | Admitting: Hematology and Oncology

## 2022-04-26 VITALS — BP 149/85 | HR 92 | Temp 98.5°F | Resp 16

## 2022-04-26 DIAGNOSIS — C787 Secondary malignant neoplasm of liver and intrahepatic bile duct: Secondary | ICD-10-CM | POA: Diagnosis not present

## 2022-04-26 DIAGNOSIS — R197 Diarrhea, unspecified: Secondary | ICD-10-CM | POA: Diagnosis not present

## 2022-04-26 DIAGNOSIS — Z5111 Encounter for antineoplastic chemotherapy: Secondary | ICD-10-CM | POA: Diagnosis not present

## 2022-04-26 DIAGNOSIS — D701 Agranulocytosis secondary to cancer chemotherapy: Secondary | ICD-10-CM | POA: Diagnosis not present

## 2022-04-26 DIAGNOSIS — Z95828 Presence of other vascular implants and grafts: Secondary | ICD-10-CM

## 2022-04-26 DIAGNOSIS — C250 Malignant neoplasm of head of pancreas: Secondary | ICD-10-CM | POA: Diagnosis not present

## 2022-04-26 DIAGNOSIS — Z803 Family history of malignant neoplasm of breast: Secondary | ICD-10-CM | POA: Diagnosis not present

## 2022-04-26 DIAGNOSIS — R112 Nausea with vomiting, unspecified: Secondary | ICD-10-CM | POA: Diagnosis not present

## 2022-04-26 DIAGNOSIS — E876 Hypokalemia: Secondary | ICD-10-CM | POA: Diagnosis not present

## 2022-04-26 DIAGNOSIS — Z5189 Encounter for other specified aftercare: Secondary | ICD-10-CM | POA: Diagnosis not present

## 2022-04-26 DIAGNOSIS — M549 Dorsalgia, unspecified: Secondary | ICD-10-CM | POA: Diagnosis not present

## 2022-04-26 DIAGNOSIS — Z452 Encounter for adjustment and management of vascular access device: Secondary | ICD-10-CM | POA: Diagnosis not present

## 2022-04-26 DIAGNOSIS — D709 Neutropenia, unspecified: Secondary | ICD-10-CM | POA: Diagnosis not present

## 2022-04-26 DIAGNOSIS — C259 Malignant neoplasm of pancreas, unspecified: Secondary | ICD-10-CM

## 2022-04-26 DIAGNOSIS — R7989 Other specified abnormal findings of blood chemistry: Secondary | ICD-10-CM | POA: Diagnosis not present

## 2022-04-26 MED ORDER — LIDOCAINE VISCOUS HCL 2 % MT SOLN
15.0000 mL | Freq: Once | OROMUCOSAL | Status: AC
  Administered 2022-04-26: 15 mL via OROMUCOSAL
  Filled 2022-04-26: qty 15

## 2022-04-26 MED ORDER — FAMOTIDINE IN NACL 20-0.9 MG/50ML-% IV SOLN
20.0000 mg | Freq: Once | INTRAVENOUS | Status: AC
  Administered 2022-04-26: 20 mg via INTRAVENOUS
  Filled 2022-04-26: qty 50

## 2022-04-26 MED ORDER — SODIUM CHLORIDE 0.9 % IV SOLN
12.5000 mg | Freq: Once | INTRAVENOUS | Status: AC
  Administered 2022-04-26: 12.5 mg via INTRAVENOUS
  Filled 2022-04-26: qty 0.5

## 2022-04-26 MED ORDER — HEPARIN SOD (PORK) LOCK FLUSH 100 UNIT/ML IV SOLN
500.0000 [IU] | Freq: Once | INTRAVENOUS | Status: AC
  Administered 2022-04-26: 500 [IU]

## 2022-04-26 MED ORDER — STERILE WATER FOR INJECTION IJ SOLN
5.0000 mL | Freq: Four times a day (QID) | OROMUCOSAL | 1 refills | Status: DC | PRN
  Filled 2022-04-26: qty 400, 14d supply, fill #0
  Filled 2022-09-03: qty 400, 14d supply, fill #1

## 2022-04-26 MED ORDER — SODIUM CHLORIDE 0.9% FLUSH
10.0000 mL | Freq: Once | INTRAVENOUS | Status: AC
  Administered 2022-04-26: 10 mL

## 2022-04-26 MED ORDER — SODIUM CHLORIDE 0.9 % IV SOLN: Freq: Once | INTRAVENOUS | Status: AC

## 2022-04-26 NOTE — Telephone Encounter (Signed)
LM on VM that results are back and to please call.  Let her know that the phone goes straight to VM.  Left CB instructions.

## 2022-04-26 NOTE — Progress Notes (Signed)
Patient presents to [facility] originally for PICC line d/c. Upon arrival, patient reports uncontrolled nausea, severe oral thrush and desire for IV fluids. 1 L NS over 1 hour, 12.5mg  phenergan and 20mg  pepcid given in [facility]. Viscous lidocaine also administered for oral pain. Per Dede Query, PA, ok for PICC line and dressing to remain until Friday, 04/29/22. Appointment made for patient to receive additional IV fluids, antiemetics and to have PICC line d/c on Friday, 04/29/22 at 0830. Patient verbalizes understanding of date and time of next infusion appointment.

## 2022-04-26 NOTE — Telephone Encounter (Signed)
Magic Mouthwash Solution w/ lidocaine Swish and Swallow  Sig: Dispense 400 ml of magic mouthwash compounded preparation. Patient to use 5 ml 4 times daily as needed for throat discomfort. 80 ml viscous lidocaine 2 % 80 ml Mylanta 80 ml diphenhydramine at 12.5 mg per 5 ml elixir 80 ml nystatin at 100,000 per 5 ml suspension 80 ml distilled water Refill: 1  Called to [facility] at [facility]

## 2022-04-27 ENCOUNTER — Other Ambulatory Visit: Payer: BC Managed Care – PPO

## 2022-04-27 ENCOUNTER — Ambulatory Visit: Payer: BC Managed Care – PPO | Admitting: Hematology and Oncology

## 2022-04-27 ENCOUNTER — Ambulatory Visit: Payer: BC Managed Care – PPO

## 2022-04-28 ENCOUNTER — Encounter: Payer: Self-pay | Admitting: Hematology and Oncology

## 2022-04-28 ENCOUNTER — Ambulatory Visit: Payer: Self-pay | Admitting: Genetic Counselor

## 2022-04-28 DIAGNOSIS — C259 Malignant neoplasm of pancreas, unspecified: Secondary | ICD-10-CM | POA: Diagnosis not present

## 2022-04-28 DIAGNOSIS — Z1379 Encounter for other screening for genetic and chromosomal anomalies: Secondary | ICD-10-CM

## 2022-04-28 NOTE — Progress Notes (Signed)
HPI:  Kaitlin Ferrell was previously seen in the [place] clinic due to a personal and family history of cancer and concerns regarding a hereditary predisposition to cancer. Please refer to our prior cancer genetics clinic note for more information regarding our discussion, assessment and recommendations, at the time. Kaitlin Ferrell recent genetic test results were disclosed to her, as were recommendations warranted by these results. These results and recommendations are discussed in more detail below.  CANCER HISTORY:  Oncology History  Pancreatic adenocarcinoma ([place])  03/24/2022 Initial Diagnosis   Pancreatic adenocarcinoma ([place])   03/30/2022 -  Chemotherapy   Patient is on Treatment Plan : PANCREAS Modified FOLFIRINOX q14d x 4 cycles      Genetic Testing   [facility] CancerNext-Expanded Panel+RNA was Negative. 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 Report date is 04/07/2022.  The CancerNext-Expanded gene panel offered by [facility] and includes sequencing, rearrangement, and RNA analysis for the following 77 genes: AIP, ALK, APC, ATM, AXIN2, BAP1, BARD1, BLM, BMPR1A, BRCA1, BRCA2, BRIP1, CDC73, CDH1, CDK4, CDKN1B, CDKN2A, CHEK2, CTNNA1, DICER1, FANCC, FH, FLCN, GALNT12, KIF1B, LZTR1, MAX, MEN1, MET, MLH1, MSH2, MSH3, MSH6, MUTYH, NBN, NF1, NF2, NTHL1, PALB2, PHOX2B, PMS2, POT1, PRKAR1A, PTCH1, PTEN, RAD51C, RAD51D, RB1, RECQL, RET, SDHA, SDHAF2, SDHB, SDHC, SDHD, SMAD4, SMARCA4, SMARCB1, SMARCE1, STK11, SUFU, TMEM127, TP53, TSC1, TSC2, VHL and XRCC2 (sequencing and deletion/duplication); EGFR, EGLN1, HOXB13, KIT, MITF, PDGFRA, POLD1, and POLE (sequencing only); EPCAM and GREM1 (deletion/duplication only).       FAMILY HISTORY:  We obtained a detailed, 4-generation family history.  Significant diagnoses are listed below: Family History  Problem Relation Age of Onset   Breast cancer Mother        dx > 17   Heart attack Mother     Diabetes Father    Breast cancer Maternal Aunt        dx > 39   Breast cancer Maternal Aunt        dx > 32   Breast cancer Maternal Grandmother        ? < 60   Stroke Paternal Grandmother    Alcoholism Other        The patient has a son and daughter who are cancer free.  She has a brother and sister who are cancer free.  Both parents are living.   The patient's father has 10 siblings who are cancer free.  His parents died of non-cancer related issues.   The patient's mother had breast cancer in her 38's-60's.  She has five sisters and three brothers.  Two sisters were diagnosed with breast cancer over age 66's.  The maternal grandparents are deceased.  The grandmother had breast cancer under age 12's.   Kaitlin Ferrell is unaware of previous family history of genetic testing for hereditary cancer risks. Patient's maternal ancestors are of [place] American and European descent, and paternal ancestors are of [place] American and European descent. There is no reported Ashkenazi Jewish ancestry. There is no known consanguinity  GENETIC TEST RESULTS: Genetic testing reported out on April 07, 2022 through the CancerNext-Expanded+RNAinsight cancer panel found no pathogenic mutations. The CancerNext-Expanded gene panel offered by [facility] and includes sequencing and rearrangement analysis for the following 77 genes: AIP, ALK, APC*, ATM*, AXIN2, BAP1, BARD1, BMPR1A, BRCA1*, BRCA2*, BRIP1*, CDC73, CDH1*, CDK4, CDKN1B, CDKN2A, CHEK2*, CTNNA1, DICER1, FH, FLCN, KIF1B, LZTR1, MAX, MEN1, MET, MLH1*, MSH2*, MSH3, MSH6*, MUTYH*, NF1*, NF2, NTHL1, PALB2*, PHOX2B, PMS2*, POT1,  PRKAR1A, PTCH1, PTEN*, RAD51C*, RAD51D*, RB1, RET, SDHA, SDHAF2, SDHB, SDHC, SDHD, SMAD4, SMARCA4, SMARCB1, SMARCE1, STK11, SUFU, TMEM127, TP53*, TSC1, TSC2, and VHL (sequencing and deletion/duplication); EGFR, EGLN1, HOXB13, KIT, MITF, PDGFRA, POLD1, and POLE (sequencing only); EPCAM and GREM1 (deletion/duplication only). DNA and RNA  analyses performed for * genes. The test report has been scanned into EPIC and is located under the Molecular Pathology section of the Results Review tab.  A portion of the result report is included below for reference.     We discussed with Kaitlin Ferrell that because current genetic testing is not perfect, it is possible there may be a gene mutation in one of these genes that current testing cannot detect, but that chance is small.  We also discussed, that there could be another gene that has not yet been discovered, or that we have not yet tested, that is responsible for the cancer diagnoses in the family. It is also possible there is a hereditary cause for the cancer in the family that Kaitlin Ferrell did not inherit and therefore was not identified in her testing.  Therefore, it is important to remain in touch with cancer genetics in the future so that we can continue to offer Kaitlin Ferrell the most up to date genetic testing.   Genetic testing did identify a variant of uncertain significance (VUS) was identified in the [place] gene called c.[id number].  At this time, it is unknown if this variant is associated with increased cancer risk or if this is a normal finding, but most variants such as this get reclassified to being inconsequential. It should not be used to make medical management decisions. With time, we suspect the lab will determine the significance of this variant, if any. If we do learn more about it, we will try to contact Kaitlin Ferrell to discuss it further. However, it is important to stay in touch with [place] periodically and keep the address and phone number up to date.  ADDITIONAL GENETIC TESTING: We discussed with Kaitlin Ferrell that her genetic testing was fairly extensive.  If there are genes identified to increase cancer risk that can be analyzed in the future, we would be happy to discuss and coordinate this testing at that time.    CANCER SCREENING RECOMMENDATIONS: Kaitlin Ferrell test result is considered  negative (normal).  This means that we have not identified a hereditary cause for her personal and family history of cancer at this time. Most cancers happen by chance and this negative test suggests that her cancer may fall into this category.    While reassuring, this does not definitively rule out a hereditary predisposition to cancer. It is still possible that there could be genetic mutations that are undetectable by current technology. There could be genetic mutations in genes that have not been tested or identified to increase cancer risk.  Therefore, it is recommended she continue to follow the cancer management and screening guidelines provided by her oncology and primary healthcare provider.   An individual's cancer risk and medical management are not determined by genetic test results alone. Overall cancer risk assessment incorporates additional factors, including personal medical history, family history, and any available genetic information that may result in a personalized plan for cancer prevention and surveillance   RECOMMENDATIONS FOR FAMILY MEMBERS:  Individuals in this family might be at some increased risk of developing cancer, over the general population risk, simply due to the family history of cancer.  We recommended women in this family have a  yearly mammogram beginning at age 73, or 17 years younger than the earliest onset of cancer, an annual clinical breast exam, and perform monthly breast self-exams. Women in this family should also have a gynecological exam as recommended by their primary provider. All family members should be referred for colonoscopy starting at age 69.  FOLLOW-UP: Lastly, we discussed with Kaitlin Ferrell that cancer genetics is a rapidly advancing field and it is possible that new genetic tests will be appropriate for her and/or her family members in the future. We encouraged her to remain in contact with cancer genetics on an annual basis so we can update her  personal and family histories and let her know of advances in cancer genetics that may benefit this family.   Our contact number was provided. Kaitlin Ferrell questions were answered to her satisfaction, and she knows she is welcome to call us at anytime with additional questions or concerns.   Roma Kayser, [place], [facility] Licensed, Certified Genetic Counselor [email or web address]

## 2022-04-28 NOTE — Telephone Encounter (Signed)
Revealed negative genetic testing.  Discussed that we do not know why she has pancreatic cancer or why there is cancer in the family. It could be due to a different gene that we are not testing, or maybe our current technology may not be able to pick something up.  It will be important for her to keep in contact with genetics to keep up with whether additional testing may be needed.  

## 2022-04-29 ENCOUNTER — Inpatient Hospital Stay: Payer: BC Managed Care – PPO

## 2022-04-29 ENCOUNTER — Other Ambulatory Visit: Payer: Self-pay | Admitting: Student

## 2022-04-29 VITALS — BP 134/80 | HR 105 | Resp 17 | Wt 141.8 lb

## 2022-04-29 DIAGNOSIS — M549 Dorsalgia, unspecified: Secondary | ICD-10-CM | POA: Diagnosis not present

## 2022-04-29 DIAGNOSIS — C259 Malignant neoplasm of pancreas, unspecified: Secondary | ICD-10-CM

## 2022-04-29 DIAGNOSIS — R7989 Other specified abnormal findings of blood chemistry: Secondary | ICD-10-CM | POA: Diagnosis not present

## 2022-04-29 DIAGNOSIS — R112 Nausea with vomiting, unspecified: Secondary | ICD-10-CM | POA: Diagnosis not present

## 2022-04-29 DIAGNOSIS — Z452 Encounter for adjustment and management of vascular access device: Secondary | ICD-10-CM | POA: Diagnosis not present

## 2022-04-29 DIAGNOSIS — Z95828 Presence of other vascular implants and grafts: Secondary | ICD-10-CM

## 2022-04-29 DIAGNOSIS — Z5111 Encounter for antineoplastic chemotherapy: Secondary | ICD-10-CM | POA: Diagnosis not present

## 2022-04-29 DIAGNOSIS — C787 Secondary malignant neoplasm of liver and intrahepatic bile duct: Secondary | ICD-10-CM | POA: Diagnosis not present

## 2022-04-29 DIAGNOSIS — E876 Hypokalemia: Secondary | ICD-10-CM | POA: Diagnosis not present

## 2022-04-29 DIAGNOSIS — D701 Agranulocytosis secondary to cancer chemotherapy: Secondary | ICD-10-CM | POA: Diagnosis not present

## 2022-04-29 DIAGNOSIS — Z803 Family history of malignant neoplasm of breast: Secondary | ICD-10-CM | POA: Diagnosis not present

## 2022-04-29 DIAGNOSIS — C250 Malignant neoplasm of head of pancreas: Secondary | ICD-10-CM | POA: Diagnosis not present

## 2022-04-29 DIAGNOSIS — Z5189 Encounter for other specified aftercare: Secondary | ICD-10-CM | POA: Diagnosis not present

## 2022-04-29 DIAGNOSIS — R197 Diarrhea, unspecified: Secondary | ICD-10-CM | POA: Diagnosis not present

## 2022-04-29 DIAGNOSIS — D709 Neutropenia, unspecified: Secondary | ICD-10-CM | POA: Diagnosis not present

## 2022-04-29 MED ORDER — SODIUM CHLORIDE 0.9 % IV SOLN: Freq: Once | INTRAVENOUS | Status: AC

## 2022-04-29 MED ORDER — FAMOTIDINE IN NACL 20-0.9 MG/50ML-% IV SOLN
20.0000 mg | Freq: Once | INTRAVENOUS | Status: DC
  Administered 2022-04-29: 20 mg via INTRAVENOUS

## 2022-04-29 MED ORDER — FAMOTIDINE IN NACL 20-0.9 MG/50ML-% IV SOLN
INTRAVENOUS | Status: AC
  Filled 2022-04-29: qty 50

## 2022-04-29 MED ORDER — HEPARIN SOD (PORK) LOCK FLUSH 100 UNIT/ML IV SOLN
500.0000 [IU] | Freq: Once | INTRAVENOUS | Status: AC
  Administered 2022-04-29: 250 [IU]

## 2022-04-29 MED ORDER — FAMOTIDINE IN NACL 20-0.9 MG/50ML-% IV SOLN: 20.0000 mg | Freq: Once | INTRAVENOUS | Status: DC

## 2022-04-29 MED ORDER — SODIUM CHLORIDE 0.9% FLUSH
10.0000 mL | Freq: Once | INTRAVENOUS | Status: AC
  Administered 2022-04-29: 10 mL

## 2022-04-29 MED ORDER — SODIUM CHLORIDE 0.9 % IV SOLN
12.5000 mg | Freq: Once | INTRAVENOUS | Status: AC
  Administered 2022-04-29: 12.5 mg via INTRAVENOUS
  Filled 2022-04-29: qty 0.5

## 2022-04-29 MED FILL — Promethazine HCl Inj 25 MG/ML: INTRAMUSCULAR | Qty: 0.5 | Status: AC

## 2022-04-29 NOTE — Patient Instructions (Signed)

## 2022-04-29 NOTE — Progress Notes (Signed)
Per Dr Lorenso Courier, ok to leave PICC in place and schedule IVF, phenergan, and pepcid for 04/30/22. Pt aware of appt. PICC dressing changed. Spoke with IR to confirm they will remove PICC on Monday when they replace pt PAC. Pt aware and agrees.

## 2022-04-30 ENCOUNTER — Inpatient Hospital Stay: Payer: BC Managed Care – PPO

## 2022-04-30 VITALS — BP 114/64 | HR 84 | Temp 98.2°F | Resp 16

## 2022-04-30 DIAGNOSIS — C250 Malignant neoplasm of head of pancreas: Secondary | ICD-10-CM | POA: Diagnosis not present

## 2022-04-30 DIAGNOSIS — R197 Diarrhea, unspecified: Secondary | ICD-10-CM | POA: Diagnosis not present

## 2022-04-30 DIAGNOSIS — Z5189 Encounter for other specified aftercare: Secondary | ICD-10-CM | POA: Diagnosis not present

## 2022-04-30 DIAGNOSIS — E876 Hypokalemia: Secondary | ICD-10-CM | POA: Diagnosis not present

## 2022-04-30 DIAGNOSIS — Z95828 Presence of other vascular implants and grafts: Secondary | ICD-10-CM | POA: Diagnosis not present

## 2022-04-30 DIAGNOSIS — R7989 Other specified abnormal findings of blood chemistry: Secondary | ICD-10-CM | POA: Diagnosis not present

## 2022-04-30 DIAGNOSIS — D701 Agranulocytosis secondary to cancer chemotherapy: Secondary | ICD-10-CM | POA: Diagnosis not present

## 2022-04-30 DIAGNOSIS — C259 Malignant neoplasm of pancreas, unspecified: Secondary | ICD-10-CM

## 2022-04-30 DIAGNOSIS — Z803 Family history of malignant neoplasm of breast: Secondary | ICD-10-CM | POA: Diagnosis not present

## 2022-04-30 DIAGNOSIS — D709 Neutropenia, unspecified: Secondary | ICD-10-CM | POA: Diagnosis not present

## 2022-04-30 DIAGNOSIS — M549 Dorsalgia, unspecified: Secondary | ICD-10-CM | POA: Diagnosis not present

## 2022-04-30 DIAGNOSIS — Z5111 Encounter for antineoplastic chemotherapy: Secondary | ICD-10-CM | POA: Diagnosis not present

## 2022-04-30 DIAGNOSIS — Z452 Encounter for adjustment and management of vascular access device: Secondary | ICD-10-CM | POA: Diagnosis not present

## 2022-04-30 DIAGNOSIS — C787 Secondary malignant neoplasm of liver and intrahepatic bile duct: Secondary | ICD-10-CM | POA: Diagnosis not present

## 2022-04-30 DIAGNOSIS — R112 Nausea with vomiting, unspecified: Secondary | ICD-10-CM | POA: Diagnosis not present

## 2022-04-30 MED ORDER — FAMOTIDINE IN NACL 20-0.9 MG/50ML-% IV SOLN
20.0000 mg | Freq: Once | INTRAVENOUS | Status: AC
  Administered 2022-04-30: 20 mg via INTRAVENOUS
  Filled 2022-04-30: qty 50

## 2022-04-30 MED ORDER — SODIUM CHLORIDE 0.9 % IV SOLN: Freq: Once | INTRAVENOUS | Status: AC

## 2022-04-30 MED ORDER — SODIUM CHLORIDE 0.9 % IV SOLN
12.5000 mg | Freq: Once | INTRAVENOUS | Status: AC
  Administered 2022-04-30: 12.5 mg via INTRAVENOUS
  Filled 2022-04-30: qty 0.5

## 2022-04-30 MED ORDER — SODIUM CHLORIDE 0.9% FLUSH
10.0000 mL | Freq: Once | INTRAVENOUS | Status: AC
  Administered 2022-04-30: 10 mL

## 2022-04-30 MED ORDER — FAMOTIDINE IN NACL 20-0.9 MG/50ML-% IV SOLN: 20.0000 mg | Freq: Once | INTRAVENOUS | Status: DC

## 2022-04-30 MED ORDER — HEPARIN SOD (PORK) LOCK FLUSH 100 UNIT/ML IV SOLN
500.0000 [IU] | Freq: Once | INTRAVENOUS | Status: AC
  Administered 2022-04-30: 500 [IU]

## 2022-05-02 ENCOUNTER — Ambulatory Visit (HOSPITAL_COMMUNITY)
Admission: RE | Admit: 2022-05-02 | Discharge: 2022-05-02 | Disposition: A | Discharge status: Home / Self Care | Payer: BC Managed Care – PPO | Source: Ambulatory Visit | Attending: Hematology and Oncology | Admitting: Hematology and Oncology

## 2022-05-02 ENCOUNTER — Other Ambulatory Visit: Payer: Self-pay | Admitting: Hematology and Oncology

## 2022-05-02 ENCOUNTER — Other Ambulatory Visit: Payer: Self-pay | Admitting: Radiology

## 2022-05-02 ENCOUNTER — Encounter (HOSPITAL_COMMUNITY): Payer: Self-pay

## 2022-05-02 ENCOUNTER — Other Ambulatory Visit: Payer: Self-pay

## 2022-05-02 DIAGNOSIS — C259 Malignant neoplasm of pancreas, unspecified: Secondary | ICD-10-CM | POA: Insufficient documentation

## 2022-05-02 DIAGNOSIS — Z452 Encounter for adjustment and management of vascular access device: Secondary | ICD-10-CM | POA: Diagnosis not present

## 2022-05-02 DIAGNOSIS — I10 Essential (primary) hypertension: Secondary | ICD-10-CM | POA: Insufficient documentation

## 2022-05-02 DIAGNOSIS — Z95828 Presence of other vascular implants and grafts: Secondary | ICD-10-CM

## 2022-05-02 HISTORY — PX: IR IMAGING GUIDED PORT INSERTION: IMG5740

## 2022-05-02 HISTORY — PX: IR REMOVAL TUN CV CATH W/O FL: IMG2289

## 2022-05-02 MED ORDER — HEPARIN SOD (PORK) LOCK FLUSH 100 UNIT/ML IV SOLN
INTRAVENOUS | Status: AC
  Filled 2022-05-02: qty 5

## 2022-05-02 MED ORDER — FENTANYL CITRATE (PF) 100 MCG/2ML IJ SOLN
INTRAMUSCULAR | Status: AC
  Filled 2022-05-02: qty 2

## 2022-05-02 MED ORDER — SODIUM CHLORIDE 0.9 % IV SOLN: INTRAVENOUS | Status: DC

## 2022-05-02 MED ORDER — MIDAZOLAM HCL 2 MG/2ML IJ SOLN
INTRAMUSCULAR | Status: AC
  Filled 2022-05-02: qty 2

## 2022-05-02 MED ORDER — CEFAZOLIN SODIUM-DEXTROSE 2-4 GM/100ML-% IV SOLN: 2.0000 g | INTRAVENOUS | Status: AC

## 2022-05-02 MED ORDER — CEFAZOLIN SODIUM-DEXTROSE 2-4 GM/100ML-% IV SOLN
INTRAVENOUS | Status: AC
  Administered 2022-05-02: 2 g via INTRAVENOUS
  Filled 2022-05-02: qty 100

## 2022-05-02 MED ORDER — FENTANYL CITRATE (PF) 100 MCG/2ML IJ SOLN
INTRAMUSCULAR | Status: AC | PRN
  Administered 2022-05-02 (×3): 50 ug via INTRAVENOUS

## 2022-05-02 MED ORDER — LIDOCAINE-EPINEPHRINE 1 %-1:100000 IJ SOLN
INTRAMUSCULAR | Status: AC
  Administered 2022-05-02: 10 mL via INTRADERMAL
  Filled 2022-05-02: qty 1

## 2022-05-02 MED ORDER — MIDAZOLAM HCL 2 MG/2ML IJ SOLN
INTRAMUSCULAR | Status: AC | PRN
  Administered 2022-05-02 (×3): 1 mg via INTRAVENOUS

## 2022-05-02 MED ORDER — HEPARIN SOD (PORK) LOCK FLUSH 100 UNIT/ML IV SOLN
INTRAVENOUS | Status: AC
  Administered 2022-05-02: 500 [IU] via INTRAVENOUS
  Filled 2022-05-02: qty 5

## 2022-05-02 MED ORDER — HEPARIN SOD (PORK) LOCK FLUSH 100 UNIT/ML IV SOLN
250.0000 [IU] | INTRAVENOUS | Status: AC | PRN
  Administered 2022-05-02: 250 [IU]

## 2022-05-02 NOTE — Procedures (Signed)
Pre Procedure Dx: Malfunctioning port (inability to access port reservoir despite recent tacking/revision) Post Procedural Dx: Same  Successful placement of left IJ approach port-a-cath with tip at the superior caval atrial junction. The catheter is ready for immediate use.  Successful removal of right sided port.  Per pt request, PICC line removal will be deferred until pt receives successful chemotherapy via the new L IJ approach later this week.    Estimated Blood Loss: Trace Complications: None immediate.  Ronny Bacon, MD Pager #: [phone]

## 2022-05-02 NOTE — H&P (Addendum)
Referring Physician(s): Dorsey,John T IV  Supervising Physician: Sandi Mariscal  Patient Status:  [facility]  Chief Complaint:  "I'm getting my port fixed"  Subjective: Patient known to IR service from Port-A-Cath placement 2013 with removal in 2014, new Port-A-Cath placement and liver mass biopsy on 03/22/2022, unsuccessful fluoroscopic needle access of right IJ port despite manual manipulation on 04/14/2022, revision of right IJ port with additional retention suture placed at right apex and function confirmed on 04/19/2022, PICC placement on 04/20/2022. Following last port intervention on 3/12 pt reports oncology staff could not access site despite 7 attempts.  She has a past medical history of gestational trophoblastic disease, HTN, migraine headaches, and newly diagnosed metastatic pancreatic carcinoma. She presents again today for port check and possible replacement/new placement. She denies fever,HA,CP,dyspnea, cough, abd/back pain,N/V or bleeding.      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   treated with 4 cycles of actinomycin D from 12-02-11 thru 01-13-12   H/O molar pregnancy, antepartum    Hypertension    Migraine    Miscarriage    Past Surgical History:  Procedure Laterality Date   BREAST BIOPSY  2001   Left   BREAST LUMPECTOMY     DILATION AND CURETTAGE OF UTERUS  1992, 09/2011   DILATION AND EVACUATION  2011   INSERTION OF MESH N/A 09/03/2013   Procedure: INSERTION OF MESH;  Surgeon: Adin Hector, MD;  Location: [facility];  Service: General;  Laterality: N/A;   IR CHEST FLUORO  04/14/2022   IR IMAGING GUIDED PORT INSERTION  03/22/2022   IR PORT REPAIR CENTRAL VENOUS ACCESS DEVICE  04/19/2022   IR US LIVER BIOPSY  03/22/2022   PORTACATH PLACEMENT  11/2011   PORTACATH PLACEMENT     REMOVAL  MARCH  2014   VENTRAL HERNIA REPAIR N/A 09/03/2013   Procedure: LAPAROSCOPIC VENTRAL WALL HERNIA REPAIR;  Surgeon: Adin Hector, MD;  Location: [facility];  Service: General;  Laterality: N/A;   WISDOM TOOTH EXTRACTION     in 11th grade      Allergies: Compazine [prochlorperazine edisylate], Propofol, Oxycodone-acetaminophen, Prochlorperazine, and Labetalol  Medications: Prior to Admission medications   Medication Sig Start Date End Date Taking? Authorizing Provider  amLODipine (NORVASC) 10 MG tablet Take 1 tablet by mouth daily. 12/08/21  Yes [provider]  diphenoxylate-atropine (LOMOTIL) 2.5-0.025 MG tablet Take 1 tablet by mouth 4 (four) times daily as needed for diarrhea or loose stools. 04/11/22  Yes Lincoln Brigham, PA-C  HYDROcodone-acetaminophen (NORCO) 10-325 MG tablet Take 1 tablet by mouth every 6 (six) hours as needed. 04/15/22  Yes Dede Query T, PA-C  lidocaine-prilocaine (EMLA) cream Apply 1 Application topically as needed. 03/14/22  Yes Dede Query T, PA-C  magic mouthwash (multi-ingredient) oral suspension Swish and swallow 5 mLs by mouth 4 times daily as needed for throat discomfort 04/26/22  Yes Dede Query T, PA-C  magnesium oxide (MAG-OX) 400 (241.3 MG) MG tablet TAKE 1 TABLET BY MOUTH TWICE A DAY 11/16/14  Yes Dennie Bible, NP  morphine (MS CONTIN) 30 MG 12 hr tablet Take 1 tablet (30 mg total) by mouth every 12 (twelve) hours. 04/15/22  Yes Dede Query T, PA-C  OLANZapine (ZYPREXA) 10 MG tablet TAKE 1 TABLET BY MOUTH EVERYDAY AT BEDTIME 04/20/22  Yes Thayil,  Joyice Faster, PA-C  ondansetron (ZOFRAN) 8 MG tablet Take 1 tablet (8 mg total) by mouth every 8 (eight) hours as needed for nausea or vomiting. 03/14/22  Yes Lincoln Brigham, PA-C  Riboflavin (B2) 100 MG TABS Take 1 tablet by mouth 2 (two) times daily. 02/05/14  Yes [provider]  scopolamine (TRANSDERM-SCOP) 1 MG/3DAYS Place 1 patch (1.5 mg total) onto the skin every 3 (three) days. 04/14/22  Yes  Dede Query T, PA-C  eletriptan (RELPAX) 40 MG tablet Take 1 tablet (40 mg total) by mouth as needed for migraine or headache. May repeat in 2 hours if needed. 09/22/14   Dennie Bible, NP  hydrochlorothiazide (MICROZIDE) 12.5 MG capsule Take 12.5 mg by mouth as needed. 01/27/20   [provider]  ibuprofen (ADVIL,MOTRIN) 200 MG tablet Take 400 mg by mouth every 6 (six) hours as needed.    [provider]  LORazepam (ATIVAN) 0.5 MG tablet Take 0.5 mg by mouth as needed for anxiety. 03/22/22   [provider]     Vital Signs: BP 124/78   Pulse (!) 101   Temp 98.7 F (37.1 C) (Oral)   Resp 16   Ht 5\' 2"  (1.575 m)   Wt 141 lb (64 kg)   LMP 06/26/2013 Comment: low dose BCP  SpO2 95%   BMI 25.79 kg/m     Code Status: FULL CODE  Physical Exam: awake/alert; chest- CTA bilat; heart- RRR; abd-soft,+BS,NT; no LE edema; rt chest port site without sig erythema; RUE PICC in place  Imaging: No results found.  Labs:  CBC: Recent Labs    03/30/22 0749 04/09/22 1554 04/14/22 0820 04/20/22 1509  WBC 4.6 2.0* 1.4* 3.7*  HGB 11.6* 12.4 11.0* 11.1*  HCT 35.4* 38.9 33.2* 34.7*  PLT 197 101* 134* 200    COAGS: Recent Labs    03/22/22 1230  INR 1.1    BMP: Recent Labs    03/30/22 0749 04/09/22 1554 04/14/22 0820 04/20/22 1509  NA 139 137 140 137  K 4.0 3.5 3.2* 4.3  CL 106 102 103 105  CO2 28 26 30 25   GLUCOSE 101* 100* 119* 100*  BUN 11 12 9 9   CALCIUM 9.0 9.3 9.4 9.0  CREATININE 0.75 0.76 0.70 0.54  GFRNONAA >60 >60 >60 >60    LIVER FUNCTION TESTS: Recent Labs    03/30/22 0749 04/09/22 1554 04/14/22 0820 04/20/22 1509  BILITOT 0.5 0.5 0.4 0.4  AST 27 22 85* 127*  ALT 24 24 87* 116*  ALKPHOS 73 81 137* 195*  PROT 7.2 8.2* 7.3 7.6  ALBUMIN 4.2 4.3 4.2 4.1    Assessment and Plan: Patient known to IR service from Port-A-Cath placement 2013 with removal in 2014, new Port-A-Cath placement and liver mass biopsy on 03/22/2022,  unsuccessful fluoroscopic needle access of right IJ port despite manual manipulation on 04/14/2022, revision of right IJ port with additional retention suture placed at right apex and function confirmed on 04/19/2022, PICC placement on 04/20/2022. Following last port intervention on 3/12 pt reports oncology staff could not access site despite 7 attempts.  She has a past medical history of gestational trophoblastic disease, HTN, migraine headaches, and newly diagnosed metastatic pancreatic carcinoma. She presents again today for port check and possible replacement/new placement.  Details/risks of procedure, including but not limited to, internal bleeding, infection, injury to adjacent structures discussed with patient and spouse with their understanding and consent.   Electronically Signed: D. Rowe Robert, PA-C 05/02/2022, 12:05 PM  I spent a total of 20 minutes at the the patient's bedside AND on the patient's hospital floor or unit, greater than 50% of which was counseling/coordinating care for port a cath evaluation/possible replacement

## 2022-05-02 NOTE — Discharge Instructions (Signed)

## 2022-05-03 ENCOUNTER — Other Ambulatory Visit: Payer: Self-pay | Admitting: Hematology and Oncology

## 2022-05-03 DIAGNOSIS — D701 Agranulocytosis secondary to cancer chemotherapy: Secondary | ICD-10-CM

## 2022-05-03 DIAGNOSIS — T451X5A Adverse effect of antineoplastic and immunosuppressive drugs, initial encounter: Secondary | ICD-10-CM

## 2022-05-03 DIAGNOSIS — E876 Hypokalemia: Secondary | ICD-10-CM

## 2022-05-03 DIAGNOSIS — Z95828 Presence of other vascular implants and grafts: Secondary | ICD-10-CM

## 2022-05-03 DIAGNOSIS — C259 Malignant neoplasm of pancreas, unspecified: Secondary | ICD-10-CM

## 2022-05-03 MED FILL — Fosaprepitant Dimeglumine For IV Infusion 150 MG (Base Eq): INTRAVENOUS | Qty: 5 | Status: AC

## 2022-05-03 MED FILL — Dexamethasone Sodium Phosphate Inj 100 MG/10ML: INTRAMUSCULAR | Qty: 1 | Status: AC

## 2022-05-03 NOTE — Progress Notes (Unsigned)
Kaitlin Ferrell [phone]   [phone]  PROGRESS NOTE:  Patient Care Team: Robyne Peers, MD as PCP - General (Family Medicine) Gordy Levan, MD as Consulting Physician (Medical Oncology)  CHIEF COMPLAINTS/PURPOSE OF CONSULTATION:  Metastatic pancreatic adenocarcinoma involving the liver  ONCOLOGIC HISTORY: Presented with nausea and right upper quadrant pain 03/08/2022: Abdominal US showed multiple hypoechoic masses within the liver are indeterminate.  03/10/2022: MR abdomen: Hypoenhancing mass compatible with pancreatic head adenocarcinoma with numerous targetoid enhancing masses in all segments of the liver, compatible with metastatic disease. The mass severely effaces and nearly occludes the portal vein and splenic vein, and occludes the superior mesenteric vein with some collaterals noted. The mass substantially abuts the proximal transverse duodenum, although overt duodenal invasion is indeterminate. 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 Prominent stool throughout the colon favors constipation. Fluid-fluid level in the gallbladder likely from sludge. 03/14/2022: Establish care with [facility]ematology/Oncology 03/17/2022: CT chest: no definitive evidence of lung metastases.  03/22/2022: Liver biopsy confirmed metastatic adenocarcinoma, pancreatic primary.  03/30/2022: Cycle 1, Day 1 of mFOLFIRINOX 04/14/2022: Cycle 2, Day 1 of mFOLFIRINOX HELD due to [place].  04/20/2022: Cycle 2, Day 1 of mFOLFIRINOX 05/04/2022: Cycle 3, Day 1 of mFOLFIRINOX  HISTORY OF PRESENTING ILLNESS:  Ambika Schuerman presents  today for a follow up before Cycle 3, Day 1 of mFOLFIRINOX.   Ms. Depaul reports she had a lot of difficulties with cycle 2 of chemotherapy.  She reports that she was having nausea, vomiting, and diarrhea.  She is also having a lot of fatigue and exhaustion.  She reports that she is taking her Zyprexa and as well as her Zofran but feels like the Zofran may have made her symptoms worse.  She is also using a scopolamine patch.  She notes that it did help to receive some fluids after treatment.  She did have 2 episodes of diarrhea that were spaced out.  They were not severe enough that she tried any of her antidiarrheal medications.  She reports that her appetite has been good though she has had a decline in weight from 186 pounds to 149 pounds.  She is doing her best to try to keep up with hydration.  Overall she is willing and able to proceed with treatment at this time.  She denies fevers, chills, sweats, shortness of breath, chest pain, cough, peripheral neuropathy or cold sensitivity. She has no other complaints. Rest of the 10 point ROS is below.   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   treated with 4 cycles of actinomycin D from 12-02-11 thru 01-13-12   H/O molar pregnancy, antepartum    Hypertension    Migraine    Miscarriage     SURGICAL HISTORY: Past Surgical History:  Procedure Laterality Date   BREAST BIOPSY  2001   Left   BREAST LUMPECTOMY     DILATION AND CURETTAGE OF UTERUS  1992, 09/2011   DILATION AND EVACUATION  2011  INSERTION OF MESH N/A 09/03/2013   Procedure: INSERTION OF MESH;  Surgeon: Adin Hector, MD;  Location: [facility];  Service: General;  Laterality: N/A;   IR CHEST FLUORO  04/14/2022   IR IMAGING GUIDED PORT INSERTION  03/22/2022   IR IMAGING GUIDED PORT INSERTION   05/02/2022   IR PORT REPAIR CENTRAL VENOUS ACCESS DEVICE  04/19/2022   IR REMOVAL TUN CV CATH W/O FL  05/02/2022   IR US LIVER BIOPSY  03/22/2022   PORTACATH PLACEMENT  11/2011   PORTACATH PLACEMENT     REMOVAL  MARCH 2014   VENTRAL HERNIA REPAIR N/A 09/03/2013   Procedure: LAPAROSCOPIC VENTRAL WALL HERNIA REPAIR;  Surgeon: Adin Hector, MD;  Location: [facility];  Service: General;  Laterality: N/A;   WISDOM TOOTH EXTRACTION     in 11th grade    SOCIAL HISTORY: Social History   Socioeconomic History   Marital status: Married    Spouse name: [name]   Number of children: 2   Years of education: college   Highest education level: Not on file  Occupational History    Comment: [employer]  Tobacco Use   Smoking status: Never   Smokeless tobacco: Never  Substance and Sexual Activity   Alcohol use: Yes    Alcohol/week: 1.0 standard drink of alcohol    Types: 1 Glasses of wine per week    Comment: occas   Drug use: No   Sexual activity: Not Currently    Birth control/protection: Pill  Other Topics Concern   Not on file  Social History Narrative   Patient is married [name]). Patient works part time at [employer].   Right handed.   Patient has her CMA.   Caffeine- None         Social Determinants of Health   Financial Resource Strain: Not on file  Food Insecurity: No Food Insecurity (03/14/2022)   Hunger Vital Sign    Worried About Running Out of Food in the Last Year: Never true    Ran Out of Food in the Last Year: Never true  Transportation Needs: No Transportation Needs (03/14/2022)   PRAPARE - Hydrologist (Medical): No    Lack of Transportation (Non-Medical): No  Physical Activity: Not on file  Stress: Not on file  Social Connections: Not on file  Intimate Partner Violence: Not At Risk (03/14/2022)   Humiliation, Afraid, Rape, and Kick questionnaire    Fear of Current or Ex-Partner: No    Emotionally Abused: No    Physically Abused: No     Sexually Abused: No    FAMILY HISTORY: Family History  Problem Relation Age of Onset   Breast cancer Mother        dx > 67   Heart attack Mother    Diabetes Father    Breast cancer Maternal Aunt        dx > 74   Breast cancer Maternal Aunt        dx > 25   Breast cancer Maternal Grandmother        ? < 50   Stroke Paternal Grandmother    Alcoholism Other     ALLERGIES:  is allergic to compazine [prochlorperazine edisylate], propofol, oxycodone-acetaminophen, prochlorperazine, and labetalol.  MEDICATIONS:  Current Outpatient Medications  Medication Sig Dispense Refill   amLODipine (NORVASC) 10 MG tablet Take 1 tablet by mouth daily.     eletriptan (RELPAX) 40 MG tablet Take 1 tablet (40  mg total) by mouth as needed for migraine or headache. May repeat in 2 hours if needed. 15 tablet 6   hydrochlorothiazide (MICROZIDE) 12.5 MG capsule Take 12.5 mg by mouth as needed.     hydrocodone-ibuprofen (VICOPROFEN) 5-200 MG tablet Take 1-2 tablets by mouth every 6 (six) hours as needed for pain. 60 tablet 0   ibuprofen (ADVIL,MOTRIN) 200 MG tablet Take 400 mg by mouth every 6 (six) hours as needed.     lidocaine-prilocaine (EMLA) cream Apply 1 Application topically as needed. 30 g 0   LORazepam (ATIVAN) 0.5 MG tablet Take 0.5 mg by mouth as needed for anxiety.     magic mouthwash (multi-ingredient) oral suspension Swish and swallow 5 mLs by mouth 4 times daily as needed for throat discomfort 400 mL 1   magnesium oxide (MAG-OX) 400 (241.3 MG) MG tablet TAKE 1 TABLET BY MOUTH TWICE A DAY 60 tablet 6   morphine (MS CONTIN) 30 MG 12 hr tablet Take 1 tablet (30 mg total) by mouth every 12 (twelve) hours. 60 tablet 0   OLANZapine (ZYPREXA) 10 MG tablet TAKE 1 TABLET BY MOUTH EVERYDAY AT BEDTIME 90 tablet 1   ondansetron (ZOFRAN) 8 MG tablet Take 1 tablet (8 mg total) by mouth every 8 (eight) hours as needed for nausea or vomiting. 90 tablet 0   promethazine (PHENERGAN) 25 MG tablet Take 1 tablet  (25 mg total) by mouth every 6 (six) hours as needed for nausea or vomiting. 30 tablet 0   Riboflavin (B2) 100 MG TABS Take 1 tablet by mouth 2 (two) times daily.  12   scopolamine (TRANSDERM-SCOP) 1 MG/3DAYS Place 1 patch (1.5 mg total) onto the skin every 3 (three) days. 10 patch 12   diphenoxylate-atropine (LOMOTIL) 2.5-0.025 MG tablet Take 1 tablet by mouth 4 (four) times daily as needed for diarrhea or loose stools. 30 tablet 0   HYDROcodone-acetaminophen (NORCO) 10-325 MG tablet Take 1 tablet by mouth every 6 (six) hours as needed. (Patient not taking: Reported on 05/04/2022) 120 tablet 0   No current facility-administered medications for this visit.   Facility-Administered Medications Ordered in Other Visits  Medication Dose Route Frequency Provider Last Rate Last Admin   fluorouracil (ADRUCIL) 4,250 mg in sodium chloride 0.9 % 65 mL chemo infusion  2,400 mg/m2 (Treatment Plan Recorded) Intravenous 1 day or 1 dose Ledell Peoples IV, MD       heparin lock flush 100 unit/mL  500 Units Intracatheter Once PRN Orson Slick, MD       irinotecan (CAMPTOSAR) 260 mg in sodium chloride 0.9 % 500 mL chemo infusion  150 mg/m2 (Treatment Plan Recorded) Intravenous Once Orson Slick, MD 342 mL/hr at 05/04/22 1343 260 mg at 05/04/22 1343   leucovorin 712 mg in sodium chloride 0.9 % 250 mL infusion  400 mg/m2 (Treatment Plan Recorded) Intravenous Once Ledell Peoples IV, MD 190 mL/hr at 05/04/22 1336 712 mg at 05/04/22 1336   sodium chloride flush (NS) 0.9 % injection 10 mL  10 mL Intracatheter PRN Orson Slick, MD        REVIEW OF SYSTEMS:   Constitutional: ( - ) fevers, ( - )  chills , ( - ) night sweats Eyes: ( - ) blurriness of vision, ( - ) double vision, ( - ) watery eyes Ears, nose, mouth, throat, and face: ( - ) mucositis, ( - ) sore throat Respiratory: ( - ) cough, ( - ) dyspnea, ( - )  wheezes Cardiovascular: ( - ) palpitation, ( - ) chest discomfort, ( - ) lower extremity  swelling Gastrointestinal:  (+) nausea, ( - ) heartburn, ( +) change in bowel habits Skin: ( - ) abnormal skin rashes Lymphatics: ( - ) new lymphadenopathy, ( - ) easy bruising Neurological: ( - ) numbness, ( - ) tingling, ( - ) new weaknesses Behavioral/Psych: ( - ) mood change, ( - ) new changes  All other systems were reviewed with the patient and are negative.  PHYSICAL EXAMINATION: ECOG PERFORMANCE STATUS: 1 - Symptomatic but completely ambulatory  Vitals:   05/04/22 0932  BP: 121/69  Pulse: (!) 110  Resp: 14  Temp: 98.3 F (36.8 C)  SpO2: 100%     Filed Weights   05/04/22 0932  Weight: 149 lb 3.2 oz (67.7 kg)     GENERAL: well appearing female in NAD  SKIN: skin color, texture, turgor are normal, no rashes or significant lesions EYES: conjunctiva are pink and non-injected, sclera clear LUNGS: clear to auscultation and percussion with normal breathing effort HEART: regular rate & rhythm and no murmurs and no lower extremity edema ABDOMEN:  normal bowel sounds. Abdomen is tender to palpation in epigastric and RUQ region. No palpable masses or hepatomegaly appreciated.  Musculoskeletal: no cyanosis of digits and no clubbing  PSYCH: alert & oriented x 3, fluent speech NEURO: no focal motor/sensory deficits  LABORATORY DATA:  I have reviewed the data as listed    Latest Ref Rng & Units 05/04/2022    9:26 AM 04/20/2022    3:09 PM 04/14/2022    8:20 AM  CBC  WBC 4.0 - 10.5 K/uL 9.1  3.7  1.4   Hemoglobin 12.0 - 15.0 g/dL 10.5  11.1  11.0   Hematocrit 36.0 - 46.0 % 32.4  34.7  33.2   Platelets 150 - 400 K/uL 124  200  134        Latest Ref Rng & Units 05/04/2022    9:26 AM 04/20/2022    3:09 PM 04/14/2022    8:20 AM  CMP  Glucose 70 - 99 mg/dL 112  100  119   BUN 6 - 20 mg/dL 7  9  9    Creatinine 0.44 - 1.00 mg/dL 0.69  0.54  0.70   Sodium 135 - 145 mmol/L 142  137  140   Potassium 3.5 - 5.1 mmol/L 3.0  4.3  3.2   Chloride 98 - 111 mmol/L 106  105  103   CO2 22 -  32 mmol/L 28  25  30    Calcium 8.9 - 10.3 mg/dL 9.2  9.0  9.4   Total Protein 6.5 - 8.1 g/dL 7.1  7.6  7.3   Total Bilirubin 0.3 - 1.2 mg/dL 0.3  0.4  0.4   Alkaline Phos 38 - 126 U/L 194  195  137   AST 15 - 41 U/L 83  127  85   ALT 0 - 44 U/L 76  116  87      RADIOGRAPHIC STUDIES: I have personally reviewed the radiological images as listed and agreed with the findings in the report. IR Removal Tun Cv Cath W/O FL  Result Date: 05/03/2022 INDICATION: Metastatic pancreatic cancer, post placement image guided Port a catheter on 03/22/2022. Unfortunately, port a catheter reservoir was noted to be tilted resulting in an inability to allow for successful port reservoir access. As such, patient underwent definitive Port revision and tacking on 04/19/2022, however despite this  intervention the infusion center has still not been able to successfully accessed the patient's port a catheter. As such, patient returns today to the interventional radiology department for definitive [facility] a catheter replacement. EXAM: 1. IMPLANTED PORT A CATH PLACEMENT WITH ULTRASOUND AND FLUOROSCOPIC GUIDANCE 2. PORT A CATHETER REMOVAL COMPARISON:  Port a catheter revision-04/19/2022 Image guided PICC line placement-04/20/2022 Port a catheter placement-03/22/2022 MEDICATIONS: Ancef 2 g IV; the antibiotics were administered within an appropriate time frame prior to the initiation of the procedure. ANESTHESIA/SEDATION: Moderate (conscious) sedation was employed during this procedure as administered by the Interventional Radiology RN. A total of Versed 3 mg and Fentanyl 150 mcg was administered intravenously. Moderate Sedation Time: 51 minutes. The patient's level of consciousness and vital signs were monitored continuously by radiology nursing throughout the procedure under my direct supervision. CONTRAST:  None FLUOROSCOPY TIME:  40 seconds (4 mGy) COMPLICATIONS: None immediate. PROCEDURE: The procedure, risks, benefits, and  alternatives were explained to the patient. Questions regarding the procedure were encouraged and answered. The patient understands and consents to the procedure. Given the presence of the malpositioned right internal jugular approach port a catheter, the decision was made to proceed with definitive left internal jugular approach port a catheter placement. As such, the left neck and chest were prepped with chlorhexidine in a sterile fashion, and a sterile drape was applied covering the operative field. Maximum barrier sterile technique with sterile gowns and gloves were used for the procedure. A timeout was performed prior to the initiation of the procedure. Local anesthesia was provided with 1% lidocaine with epinephrine. After creating a small venotomy incision, a micropuncture kit was utilized to access the internal jugular vein. Real-time ultrasound guidance was utilized for vascular access including the acquisition of a permanent ultrasound image documenting patency of the accessed vessel. The microwire was utilized to measure appropriate catheter length. A subcutaneous port pocket was then created along the upper chest wall utilizing a combination of sharp and blunt dissection. The pocket was irrigated with sterile saline. A single lumen ISP sized power injectable port was chosen for placement. The 8 Fr catheter was tunneled from the port pocket site to the venotomy incision. The port was placed in the pocket. The external catheter was trimmed to appropriate length. At the venotomy, an 8 Fr peel-away sheath was placed over a guidewire under fluoroscopic guidance. The catheter was then placed through the sheath and the sheath was removed. Final catheter positioning was confirmed and documented with a fluoroscopic spot radiograph. The port was accessed with a Huber needle, aspirated and flushed with heparinized saline. The venotomy site was closed with an interrupted 4-0 Vicryl suture. The port pocket incision  was closed with interrupted 2-0 Vicryl suture. The skin was opposed with a running subcuticular 4-0 Vicryl suture. Dermabond and Steri-strips were applied to both incisions. Dressings were applied. Attention was now paid towards removal of the malpositioned right-sided port a catheter. The skin overlying the operative site was prepped and draped in usual sterile fashion. After the adjacent subcutaneous tissues were anesthetized with 1% lidocaine with epinephrine, the port a Catheter was removed intact using a combination of sharp and blunt dissection. Port incision was closed with an interrupted 2-0 Vicryl suture. Dermabond and Steri-Strips were applied to the incision. Dressings were applied. The patient tolerated the above procedures well without immediate postprocedural complication. FINDINGS: After catheter placement, the left internal jugular approach port a catheter tip lies within the superior cavoatrial junction. The catheter aspirates and flushes normally and  is ready for immediate use. After definitive removal of the right internal jugular approach port a catheter, the left internal jugular approach port a catheter as well as the right upper extremity approach PICC line are unchanged in position. IMPRESSION: 1. Successful placement of a left internal jugular approach power injectable Port-A-Cath. The catheter is ready for immediate use. 2. Successful removal of right internal jugular approach port a catheter. PLAN: - The patient is scheduled for chemotherapy this coming Wednesday (05/04/2022). - If the left internal jugular approach port a catheter is successfully accessed and utilized for treatment, the patient may return to the interventional radiology department for removal of the right upper extremity approach PICC line as indicated. Electronically Signed   By: Sandi Mariscal M.D.   On: 05/03/2022 12:33   IR IMAGING GUIDED PORT INSERTION  Result Date: 05/03/2022 INDICATION: Metastatic pancreatic  cancer, post placement image guided Port a catheter on 03/22/2022. Unfortunately, port a catheter reservoir was noted to be tilted resulting in an inability to allow for successful port reservoir access. As such, patient underwent definitive Port revision and tacking on 04/19/2022, however despite this intervention the infusion center has still not been able to successfully accessed the patient's port a catheter. As such, patient returns today to the interventional radiology department for definitive [facility] a catheter replacement. EXAM: 1. IMPLANTED PORT A CATH PLACEMENT WITH ULTRASOUND AND FLUOROSCOPIC GUIDANCE 2. PORT A CATHETER REMOVAL COMPARISON:  Port a catheter revision-04/19/2022 Image guided PICC line placement-04/20/2022 Port a catheter placement-03/22/2022 MEDICATIONS: Ancef 2 g IV; the antibiotics were administered within an appropriate time frame prior to the initiation of the procedure. ANESTHESIA/SEDATION: Moderate (conscious) sedation was employed during this procedure as administered by the Interventional Radiology RN. A total of Versed 3 mg and Fentanyl 150 mcg was administered intravenously. Moderate Sedation Time: 51 minutes. The patient's level of consciousness and vital signs were monitored continuously by radiology nursing throughout the procedure under my direct supervision. CONTRAST:  None FLUOROSCOPY TIME:  40 seconds (4 mGy) COMPLICATIONS: None immediate. PROCEDURE: The procedure, risks, benefits, and alternatives were explained to the patient. Questions regarding the procedure were encouraged and answered. The patient understands and consents to the procedure. Given the presence of the malpositioned right internal jugular approach port a catheter, the decision was made to proceed with definitive left internal jugular approach port a catheter placement. As such, the left neck and chest were prepped with chlorhexidine in a sterile fashion, and a sterile drape was applied covering the operative  field. Maximum barrier sterile technique with sterile gowns and gloves were used for the procedure. A timeout was performed prior to the initiation of the procedure. Local anesthesia was provided with 1% lidocaine with epinephrine. After creating a small venotomy incision, a micropuncture kit was utilized to access the internal jugular vein. Real-time ultrasound guidance was utilized for vascular access including the acquisition of a permanent ultrasound image documenting patency of the accessed vessel. The microwire was utilized to measure appropriate catheter length. A subcutaneous port pocket was then created along the upper chest wall utilizing a combination of sharp and blunt dissection. The pocket was irrigated with sterile saline. A single lumen ISP sized power injectable port was chosen for placement. The 8 Fr catheter was tunneled from the port pocket site to the venotomy incision. The port was placed in the pocket. The external catheter was trimmed to appropriate length. At the venotomy, an 8 Fr peel-away sheath was placed over a guidewire under fluoroscopic guidance. The catheter  was then placed through the sheath and the sheath was removed. Final catheter positioning was confirmed and documented with a fluoroscopic spot radiograph. The port was accessed with a Huber needle, aspirated and flushed with heparinized saline. The venotomy site was closed with an interrupted 4-0 Vicryl suture. The port pocket incision was closed with interrupted 2-0 Vicryl suture. The skin was opposed with a running subcuticular 4-0 Vicryl suture. Dermabond and Steri-strips were applied to both incisions. Dressings were applied. Attention was now paid towards removal of the malpositioned right-sided port a catheter. The skin overlying the operative site was prepped and draped in usual sterile fashion. After the adjacent subcutaneous tissues were anesthetized with 1% lidocaine with epinephrine, the port a Catheter was removed  intact using a combination of sharp and blunt dissection. Port incision was closed with an interrupted 2-0 Vicryl suture. Dermabond and Steri-Strips were applied to the incision. Dressings were applied. The patient tolerated the above procedures well without immediate postprocedural complication. FINDINGS: After catheter placement, the left internal jugular approach port a catheter tip lies within the superior cavoatrial junction. The catheter aspirates and flushes normally and is ready for immediate use. After definitive removal of the right internal jugular approach port a catheter, the left internal jugular approach port a catheter as well as the right upper extremity approach PICC line are unchanged in position. IMPRESSION: 1. Successful placement of a left internal jugular approach power injectable Port-A-Cath. The catheter is ready for immediate use. 2. Successful removal of right internal jugular approach port a catheter. PLAN: - The patient is scheduled for chemotherapy this coming Wednesday (05/04/2022). - If the left internal jugular approach port a catheter is successfully accessed and utilized for treatment, the patient may return to the interventional radiology department for removal of the right upper extremity approach PICC line as indicated. Electronically Signed   By: Sandi Mariscal M.D.   On: 05/03/2022 12:33   IR PICC PLACEMENT RIGHT >5 YRS INC IMG GUIDE  Result Date: 04/20/2022 INDICATION: History of metastatic pancreatic cancer on chemotherapy. Current port malfunction. Request PICC line placement for intravenous therapies. EXAM: ULTRASOUND AND FLUOROSCOPIC GUIDED RIGHT UPPER EXTREMITY PICC LINE INSERTION MEDICATIONS: 1% plain lidocaine, 1 mL CONTRAST:  None FLUOROSCOPY TIME:  Radiation exposure index: (2  MGy) COMPLICATIONS: None immediate. TECHNIQUE: The procedure, risks, benefits, and alternatives were explained to the patient and informed written consent was obtained. A timeout was  performed prior to the initiation of the procedure. The right upper extremity was prepped with chlorhexidine in a sterile fashion, and a sterile drape was applied covering the operative field. Maximum barrier sterile technique with sterile gowns and gloves were used for the procedure. A timeout was performed prior to the initiation of the procedure. Local anesthesia was provided with 1% lidocaine. Under direct ultrasound guidance, the basilic vein was accessed with a micropuncture kit after the overlying soft tissues were anesthetized with 1% lidocaine. After the overlying soft tissues were anesthetized, a small venotomy incision was created and a micropuncture kit was utilized to access the right basilic vein. Real-time ultrasound guidance was utilized for vascular access including the acquisition of a permanent ultrasound image documenting patency of the accessed vessel. A guidewire was advanced to the level of the superior caval-atrial junction for measurement purposes and the PICC line was cut to length. A peel-away sheath was placed and a 35 cm, 5 [place], dual lumen was inserted to level of the superior caval-atrial junction. A post procedure spot fluoroscopic was obtained. The  catheter easily aspirated and flushed and was secured in place. A dressing was placed. The patient tolerated the procedure well without immediate post procedural complication. FINDINGS: After catheter placement, the tip lies within the superior cavoatrial junction. The catheter aspirates and flushes normally and is ready for immediate use. IMPRESSION: Successful ultrasound and fluoroscopic guided placement of a right basilic vein approach, 35 cm, 5 French, dual lumen PICC with tip at the superior caval-atrial junction. The PICC line is ready for immediate use. Read by: Ascencion Dike PA-C Electronically Signed   By: Jerilynn Mages.  Shick M.D.   On: 04/20/2022 15:05   IR Port Repair Central Venous Access Device  Result Date: 04/19/2022 INDICATION:  57 year old female with question of port malfunction EXAM: IMAGE GUIDED PORT CATHETER REVISION MEDICATIONS: None. ANESTHESIA/SEDATION: Moderate (conscious) sedation was employed during this procedure. A total of Versed 2.0 mg and Fentanyl 100 mcg was administered intravenously by the radiology nurse. Total intra-service moderate Sedation Time: 31 minutes. The patient's level of consciousness and vital signs were monitored continuously by radiology nursing throughout the procedure under my direct supervision. FLUOROSCOPY: Radiation Exposure Index (as provided by the fluoroscopic device): 0 mGy Kerma COMPLICATIONS: None PROCEDURE: The procedure, risks, benefits, and alternatives were explained to the patient. Questions regarding the procedure were encouraged and answered. The patient understands and consents to the procedure. The right neck and chest were prepped with chlorhexidine in a sterile fashion, and a sterile drape was applied covering the operative field. Maximum barrier sterile technique with sterile gowns and gloves were used for the procedure. A timeout was performed prior to the initiation of the procedure. Local anesthesia was provided with 1% lidocaine with epinephrine. Physical exam was performed, and initial image was stored. After initiation of moderate sedation, local anesthesia was administered at the right chest wall port catheter site. Incision was made along the prior scar after local anesthesia was applied. Blunt dissection was used to open the port pocket to observe the orientation of the port catheter. We observed that the prior retention suture was in place at the apex of the port and that the port was not inverted/rotated. There is a slight leftward orientation of the rubber septum. At this point using physical exam and manual palpation, a 1 inch Huber needle was used to access the port. Note that the 1 inch Huber needle is hub once the needle is in place in the port. Then, using a hemostat  the hub of the port catheter was grasped and the port was rotated towards the patient's right side. Aspiration and flush was confirmed through the 1 inch Huber needle. 500 units of Hep-Lock in 5 cc was then administered. At this time a 0 Prolene suture was used to anchor the right corner of the port catheter to the soft tissues in the port pocket, for a more central orientation of the rubber septum. The original retention suture was left intact. The pocket was irrigated copiously with sterile saline. The port pocket incision was closed with interrupted 2-0 Vicryl suture and the skin was opposed with a running subcuticular 4-0 Vicryl suture. Dermabond was applied. Dressings were placed. The patient tolerated the procedure well without immediate post procedural complication. FINDINGS: Upon initial physical exam, the rubber septum is palpable under the skin surface, with the septum oriented slightly towards the patient's left shoulder. Upon opening the port catheter, the stay suture was confirmed to be intact, with the septum oriented slightly towards the patient's left shoulder. A more midline orientation was attempted  with rotating the port in the pocket, with a second stay suture placed at the right apex. We confirmed access was capable through the skin service with a 1 inch Huber needle. The 1 inch Huber needle is hub at the skin surface. Perhaps further access may be easier with 1.25 inch or 1.5 inch Huber needle. Directing slightly towards the patient's right allows confident access. IMPRESSION: Status post image guided revision of right-sided IJ port catheter, with additional retention suture placed at the right apex, and function confirmed. Signed, Dulcy Fanny. Nadene Rubins, RPVI [facility] Radiology Electronically Signed   By: Corrie Mckusick D.O.   On: 04/19/2022 13:27   DG Chest 2 View  Result Date: 04/16/2022 CLINICAL DATA:  Evaluate port placement EXAM:  CHEST - 2 VIEW COMPARISON:  Port placement images [id number] FINDINGS: Right IJ approach single-lumen power injectable port catheter. The catheter tip overlies the mid SVC. The port reservoir appears tilted to the side. Cardiac and mediastinal contours are normal. Atherosclerotic calcifications present in the transverse aorta. The lungs are clear. No acute osseous abnormality. IMPRESSION: 1. Right IJ approach single-lumen power injectable port catheter. The catheter tip overlies the mid SVC. 2. The port reservoir appears slightly tilted with the access site facing somewhat anterolaterally rather than directly anterior. 3. No acute cardiopulmonary process. 4. Atherosclerotic calcifications in the aorta. Electronically Signed   By: Jacqulynn Cadet M.D.   On: 04/16/2022 17:21   IR Chest Fluoro  Result Date: 04/14/2022 INDICATION: Inability to access port catheter, unable to get infusion today. EXAM: Unsuccessful attempt at port catheter access under fluoroscopy MEDICATIONS: None. ANESTHESIA/SEDATION: None. FLUOROSCOPY: Radiation Exposure Index (as provided by the fluoroscopic device): 3.0 mGy Kerma COMPLICATIONS: None immediate. PROCEDURE: Informed written consent was obtained from the patient after a thorough discussion of the procedural risks, benefits and alternatives. All questions were addressed. Maximal Sterile Barrier Technique was utilized including caps, mask, sterile gowns, sterile gloves, sterile drape, hand hygiene and skin antiseptic. A timeout was performed prior to the initiation of the procedure. Under sterile conditions, fluoroscopic needle access was attempted of the right IJ power port catheter. Despite manual manipulation and fluoroscopic guidance, needle access within the port catheter cannot be obtained. Needle passes made unsuccessfully. By fluoroscopy, the port catheter position has changed compared to the original placement. IMPRESSION: 1. Unsuccessful fluoroscopic needle access of the right IJ  power port catheter despite manual manipulation and fluoroscopic guidance. 2. Unable to access the port catheter under fluoroscopy. 3. Port catheter revision will be scheduled as an outpatient electively. Electronically Signed   By: Jerilynn Mages.  Shick M.D.   On: 04/14/2022 14:05    ASSESSMENT & PLAN Rasheed Givler is a 57 y.o. female who returns to the clinic for newly diagnosed pancreatic adenocarcinoma.    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 Dr. Lorenso Courier recommends chemotherapy regimen with FOLFIRINOX q 2 weeks.  --Plan to obtain restaging CT scans every 3 months to assess treatment response.  --[facility] Testing: MS-stable, 2 Muts/Mb, ATMH67fs*31, KRASG12L, MDM2 amplification, CDKN2A/B. No targetable mutations.  --Started mFOLFIRINOX on 03/30/2022 PLAN: --Due to start Cycle 3, Day 1 of FOLFIRINOX today --Labs  from today reviewed with patient. WBC 9.1, Hgb 10.5, MCV 85.7, Plt 124 --OK to proceed with treatment today.  --RTC in 1 weeks  with labs and follow up before Cycle 4, Day 1.   Port issues: --Flush was unable to access port today --Ordered chest xray to confirm port placement --IR will assess patient later today.   Neutropenia: --Due to critical levels with ANC 300, gave one dose of Zarzio 300 mcg in clinic at last visit.  --Defered treatment by one week and plan to give Udenyca on Day 3 starting with Cycle 2.  --Neutropenic precautions given to patient including monitoring for fevers.   Elevated LFTs: --ALT 76, AST 83 --Etiology includes chemotherapy induced, liver metastases versus increased acetaminophen use. --Monitor for now and advised to take 1 tablet of Vicoprofen every 6 hours as  prescribed.  Hypokalemia: --Potassium level is 3.0 --Likely secondary to vomiting/diarrhea --continue oral Potassium Chloride 40 mEq daily   Nausea/Vomiting: --Secondary to chemotherapy -- Currently on zofran 8mg  q8H PRN for nausea, zyprexa nightly PRN for nausea. Encouraged patient to take zofran q 8 hours rather than once.  -- patient using scopolamine patch as well.  -- Has allergy to compazine, but tolerated IV phernegan. Will prescribe phernergan 25mg  PO.   #Epigastric/RUQ/back pain: --Likely secondary to underlying malignancy --Tramadol was ineffective.  --Current pain regimen includes MS Contin 15 mg q 12 hours and Norco 5-325 mg for breakthrough pain. Pain is not well controlled so we will increase dose of MS contin to 30 mg q 12 hours and Norco 10-325 mg. Advised not to double or triple dose of Norco without provider instruction.   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 --Sent referral to [facility] nutrition team  #Family history of breast cancer: --Due to strong family history of cancer and has undergone genetic testing in the past. With the anticipated diagnosis of pancreatic cancer, we will make a referral to genetics to see if additional testing is required.   #Supportive Care -- chemotherapy education to be scheduled  -- port placement complete  -- EMLA cream for port   No orders of the defined types were placed in this encounter.   All questions were answered. The patient knows to call the clinic with any problems, questions or concerns.  I have spent a total of 30 minutes minutes of face-to-face and non-face-to-face time, preparing to see the patient,  performing a medically appropriate examination, counseling and educating the patient, documenting clinical information in the electronic health  record,  and care coordination.   Ledell Peoples, MD Department of Hematology/Oncology [facility] Phone: [phone] Pager: [phone] Email: [email or web address]

## 2022-05-04 ENCOUNTER — Inpatient Hospital Stay (HOSPITAL_BASED_OUTPATIENT_CLINIC_OR_DEPARTMENT_OTHER): Payer: BC Managed Care – PPO | Admitting: Hematology and Oncology

## 2022-05-04 ENCOUNTER — Inpatient Hospital Stay: Payer: BC Managed Care – PPO

## 2022-05-04 ENCOUNTER — Other Ambulatory Visit: Payer: Self-pay | Admitting: *Deleted

## 2022-05-04 VITALS — BP 120/77 | HR 79 | Resp 17

## 2022-05-04 VITALS — BP 121/69 | HR 110 | Temp 98.3°F | Resp 14 | Ht 62.0 in | Wt 149.2 lb

## 2022-05-04 DIAGNOSIS — C787 Secondary malignant neoplasm of liver and intrahepatic bile duct: Secondary | ICD-10-CM | POA: Diagnosis not present

## 2022-05-04 DIAGNOSIS — Z95828 Presence of other vascular implants and grafts: Secondary | ICD-10-CM

## 2022-05-04 DIAGNOSIS — Z803 Family history of malignant neoplasm of breast: Secondary | ICD-10-CM | POA: Diagnosis not present

## 2022-05-04 DIAGNOSIS — C259 Malignant neoplasm of pancreas, unspecified: Secondary | ICD-10-CM

## 2022-05-04 DIAGNOSIS — Z452 Encounter for adjustment and management of vascular access device: Secondary | ICD-10-CM | POA: Diagnosis not present

## 2022-05-04 DIAGNOSIS — Z5189 Encounter for other specified aftercare: Secondary | ICD-10-CM | POA: Diagnosis not present

## 2022-05-04 DIAGNOSIS — E876 Hypokalemia: Secondary | ICD-10-CM | POA: Diagnosis not present

## 2022-05-04 DIAGNOSIS — D709 Neutropenia, unspecified: Secondary | ICD-10-CM | POA: Diagnosis not present

## 2022-05-04 DIAGNOSIS — C250 Malignant neoplasm of head of pancreas: Secondary | ICD-10-CM | POA: Diagnosis not present

## 2022-05-04 DIAGNOSIS — Z5111 Encounter for antineoplastic chemotherapy: Secondary | ICD-10-CM | POA: Diagnosis not present

## 2022-05-04 DIAGNOSIS — R7989 Other specified abnormal findings of blood chemistry: Secondary | ICD-10-CM | POA: Diagnosis not present

## 2022-05-04 DIAGNOSIS — M549 Dorsalgia, unspecified: Secondary | ICD-10-CM | POA: Diagnosis not present

## 2022-05-04 DIAGNOSIS — R112 Nausea with vomiting, unspecified: Secondary | ICD-10-CM | POA: Diagnosis not present

## 2022-05-04 DIAGNOSIS — D701 Agranulocytosis secondary to cancer chemotherapy: Secondary | ICD-10-CM | POA: Diagnosis not present

## 2022-05-04 DIAGNOSIS — R197 Diarrhea, unspecified: Secondary | ICD-10-CM | POA: Diagnosis not present

## 2022-05-04 LAB — CBC WITH DIFFERENTIAL (CANCER CENTER ONLY)
Abs Immature Granulocytes: 0.2 10*3/uL — ABNORMAL HIGH (ref 0.00–0.07)
Basophils Absolute: 0 10*3/uL (ref 0.0–0.1)
Basophils Relative: 0 %
Eosinophils Absolute: 0 10*3/uL (ref 0.0–0.5)
Eosinophils Relative: 0 %
HCT: 32.4 % — ABNORMAL LOW (ref 36.0–46.0)
Hemoglobin: 10.5 g/dL — ABNORMAL LOW (ref 12.0–15.0)
Immature Granulocytes: 2 %
Lymphocytes Relative: 20 %
Lymphs Abs: 1.8 10*3/uL (ref 0.7–4.0)
MCH: 27.8 pg (ref 26.0–34.0)
MCHC: 32.4 g/dL (ref 30.0–36.0)
MCV: 85.7 fL (ref 80.0–100.0)
Monocytes Absolute: 1.2 10*3/uL — ABNORMAL HIGH (ref 0.1–1.0)
Monocytes Relative: 13 %
Neutro Abs: 5.9 10*3/uL (ref 1.7–7.7)
Neutrophils Relative %: 65 %
Platelet Count: 124 10*3/uL — ABNORMAL LOW (ref 150–400)
RBC: 3.78 MIL/uL — ABNORMAL LOW (ref 3.87–5.11)
RDW: 14.8 % (ref 11.5–15.5)
WBC Count: 9.1 10*3/uL (ref 4.0–10.5)
nRBC: 0.4 % — ABNORMAL HIGH (ref 0.0–0.2)

## 2022-05-04 LAB — CMP (CANCER CENTER ONLY)
ALT: 76 U/L — ABNORMAL HIGH (ref 0–44)
AST: 83 U/L — ABNORMAL HIGH (ref 15–41)
Albumin: 3.9 g/dL (ref 3.5–5.0)
Alkaline Phosphatase: 194 U/L — ABNORMAL HIGH (ref 38–126)
Anion gap: 8 (ref 5–15)
BUN: 7 mg/dL (ref 6–20)
CO2: 28 mmol/L (ref 22–32)
Calcium: 9.2 mg/dL (ref 8.9–10.3)
Chloride: 106 mmol/L (ref 98–111)
Creatinine: 0.69 mg/dL (ref 0.44–1.00)
GFR, Estimated: >60 mL/min (ref >60)
Glucose, Bld: 112 mg/dL — ABNORMAL HIGH (ref 70–99)
Potassium: 3 mmol/L — ABNORMAL LOW (ref 3.5–5.1)
Sodium: 142 mmol/L (ref 135–145)
Total Bilirubin: 0.3 mg/dL (ref 0.3–1.2)
Total Protein: 7.1 g/dL (ref 6.5–8.1)

## 2022-05-04 MED ORDER — OXALIPLATIN CHEMO INJECTION 100 MG/20ML
85.0000 mg/m2 | Freq: Once | INTRAVENOUS | Status: AC
  Administered 2022-05-04: 150 mg via INTRAVENOUS
  Filled 2022-05-04: qty 30

## 2022-05-04 MED ORDER — SODIUM CHLORIDE 0.9 % IV SOLN
2400.0000 mg/m2 | INTRAVENOUS | Status: DC
  Administered 2022-05-04: 4250 mg via INTRAVENOUS
  Filled 2022-05-04: qty 85

## 2022-05-04 MED ORDER — SODIUM CHLORIDE 0.9 % IV SOLN
400.0000 mg/m2 | Freq: Once | INTRAVENOUS | Status: AC
  Administered 2022-05-04: 712 mg via INTRAVENOUS
  Filled 2022-05-04: qty 35.6

## 2022-05-04 MED ORDER — SODIUM CHLORIDE 0.9 % IV SOLN
150.0000 mg | Freq: Once | INTRAVENOUS | Status: AC
  Administered 2022-05-04: 150 mg via INTRAVENOUS
  Filled 2022-05-04: qty 150

## 2022-05-04 MED ORDER — PROMETHAZINE HCL 25 MG PO TABS: 25.0000 mg | ORAL_TABLET | Freq: Four times a day (QID) | ORAL | 0 refills | Status: DC | PRN

## 2022-05-04 MED ORDER — PALONOSETRON HCL INJECTION 0.25 MG/5ML
0.2500 mg | Freq: Once | INTRAVENOUS | Status: AC
  Administered 2022-05-04: 0.25 mg via INTRAVENOUS
  Filled 2022-05-04: qty 5

## 2022-05-04 MED ORDER — HEPARIN SOD (PORK) LOCK FLUSH 100 UNIT/ML IV SOLN: 500.0000 [IU] | Freq: Once | INTRAVENOUS | Status: DC | PRN

## 2022-05-04 MED ORDER — SODIUM CHLORIDE 0.9 % IV SOLN
10.0000 mg | Freq: Once | INTRAVENOUS | Status: AC
  Administered 2022-05-04: 10 mg via INTRAVENOUS
  Filled 2022-05-04: qty 10

## 2022-05-04 MED ORDER — HYDROCODONE-IBUPROFEN 5-200 MG PO TABS: 1.0000 | ORAL_TABLET | Freq: Four times a day (QID) | ORAL | 0 refills | Status: DC | PRN

## 2022-05-04 MED ORDER — SODIUM CHLORIDE 0.9% FLUSH: 10.0000 mL | INTRAVENOUS | Status: DC | PRN

## 2022-05-04 MED ORDER — DEXTROSE 5 % IV SOLN: Freq: Once | INTRAVENOUS | Status: AC

## 2022-05-04 MED ORDER — ATROPINE SULFATE 1 MG/ML IV SOLN
0.5000 mg | Freq: Once | INTRAVENOUS | Status: AC | PRN
  Administered 2022-05-04: 0.5 mg via INTRAVENOUS
  Filled 2022-05-04: qty 1

## 2022-05-04 MED ORDER — SODIUM CHLORIDE 0.9 % IV SOLN
150.0000 mg/m2 | Freq: Once | INTRAVENOUS | Status: AC
  Administered 2022-05-04: 260 mg via INTRAVENOUS
  Filled 2022-05-04: qty 13

## 2022-05-04 NOTE — Progress Notes (Signed)
Pt tolerated PICC removal well. Observed for 30 min post removal. Discharge instructions reviewed. Discharged with dressing c/d/I, VSS, wheelchair to lobby.

## 2022-05-04 NOTE — Progress Notes (Signed)
Okay to treat with AST 83 per Dr. Lorenso Courier.

## 2022-05-04 NOTE — Patient Instructions (Addendum)
Kaitlin Ferrell  Discharge Instructions: Thank you for choosing [facility] to provide your oncology and hematology care.   If you have a lab appointment with the [facility], please go directly to the [facility] and check in at the registration area.   Wear comfortable clothing and clothing appropriate for easy access to any Portacath or PICC line.   We strive to give you quality time with your provider. You may need to reschedule your appointment if you arrive late (15 or more minutes).  Arriving late affects you and other patients whose appointments are after yours.  Also, if you miss three or more appointments without notifying the office, you may be dismissed from the clinic at the provider's discretion.      For prescription refill requests, have your pharmacy contact our office and allow 72 hours for refills to be completed.    Today you received the following chemotherapy and/or immunotherapy agents : Oxaliplatin. Leucovorin, Irinotecan, 5FU      To help prevent nausea and vomiting after your treatment, we encourage you to take your nausea medication as directed.  BELOW ARE SYMPTOMS THAT SHOULD BE REPORTED IMMEDIATELY: *FEVER GREATER THAN 100.4 F (38 C) OR HIGHER *CHILLS OR SWEATING *NAUSEA AND VOMITING THAT IS NOT CONTROLLED WITH YOUR NAUSEA MEDICATION *UNUSUAL SHORTNESS OF BREATH *UNUSUAL BRUISING OR BLEEDING *URINARY PROBLEMS (pain or burning when urinating, or frequent urination) *BOWEL PROBLEMS (unusual diarrhea, constipation, pain near the anus) TENDERNESS IN MOUTH AND THROAT WITH OR WITHOUT PRESENCE OF ULCERS (sore throat, sores in mouth, or a toothache) UNUSUAL RASH, SWELLING OR PAIN  UNUSUAL VAGINAL DISCHARGE OR ITCHING   Items with * indicate a potential emergency and should be followed up as soon as possible or go to the Emergency Department if any problems should occur.  Please show the CHEMOTHERAPY ALERT CARD or  IMMUNOTHERAPY ALERT CARD at check-in to the Emergency Department and triage nurse.  Should you have questions after your visit or need to cancel or reschedule your appointment, please contact [facility]  Dept: [phone]  and follow the prompts.  Office hours are 8:00 a.m. to 4:30 p.m. Monday - Friday. Please note that voicemails left after 4:00 p.m. may not be returned until the following business day.  We are closed weekends and major holidays. You have access to a nurse at all times for urgent questions. Please call the main number to the clinic Dept: [phone] and follow the prompts.   For any non-urgent questions, you may also contact your provider using MyChart. We now offer e-Visits for anyone 60 and older to request care online for non-urgent symptoms. For details visit [email or web address].   Also download the MyChart app! Go to the app store, search "MyChart", open the app, select , and log in with your MyChart username and password.  PICC Removal, Adult, Care After The following information offers guidance on how to care for yourself after your procedure. Your health care provider may also give you more specific instructions. If you have problems or questions, contact your health care provider. What can I expect after the procedure? After the procedure, it is common to have: Tenderness or soreness. Redness, swelling, or a scab at the place where your PICC was removed (exit site). Follow these instructions at home: For the first 24 hours after the procedure: Keep the bandage (dressing) on your exit site clean and dry. Do not remove your  dressing until your health care provider tells you to do so. Do not lift anything heavy or do activities that require great effort until your health care provider says it is okay. You should avoid: Lifting weights. Doing yard work. Doing any physical activity with repetitive arm movement. Watch  closely for any signs of an air bubble in the vein (air embolism). This is a rare but serious complication. Signs of an air embolism include trouble breathing, wheezing, chest pain, or a fast pulse. If you have signs of an air embolism, call 911 right away and lie down on your left side to keep the air from moving into your lungs. After 24 hours have passed:  Remove your dressing as told by your health care provider. Wash your hands with soap and water for at least 20 seconds before and after you change your dressing. If soap and water are not available, use hand sanitizer. Return to your normal activities as told by your health care provider. A small scab may develop over the exit site. Do not pick at the scab. When bathing or showering, gently wash the exit site with soap and water. Pat it dry. Watch for signs of infection, such as: A fever or chills. Swollen glands under your arm. More redness, swelling, or soreness around your arm. Blood, fluid, or pus coming from your exit site. Warmth or a bad smell coming from your exit site. A red streak spreading away from your exit site. General instructions Take over-the-counter and prescription medicines only as told by your health care provider. Do not take any new medicines without checking with your health care provider first. If you were given an antibiotic ointment, apply it as told by your health care provider. Keep all follow-up visits. This is important. Contact a health care provider if: You have a fever or chills. You have swelling at your exit site or swollen glands under your arm. You have signs of infection at your exit site. You have soreness, redness, or swelling in your arm that gets worse. Get help right away if: You have numbness or tingling in your fingers, hand, or arm. Your arm looks blue and feels cold. You have signs of an air embolism, such as trouble breathing, wheezing, chest pain, or a fast pulse. These symptoms may  be an emergency. Get medical help right away. Call 911. Do not wait to see if the symptoms will go away. Do not drive yourself to the hospital. Summary After a PICC is removed, it is common to have tenderness or soreness, redness, swelling, or a scab at the exit site. Keep the bandage (dressing) over the exit site clean and dry. Do not remove the dressing until your health care provider tells you to do so. Do not lift anything heavy or do activities that require great effort until your health care provider says it is okay. Watch closely for any signs of an air bubble (air embolism). If you have signs of an air embolism, call 911 right away and lie down on your left side. This information is not intended to replace advice given to you by your health care provider. Make sure you discuss any questions you have with your health care provider. Document Revised: 08/12/2020 Document Reviewed: 08/12/2020 Elsevier Patient Education  [employer].

## 2022-05-05 ENCOUNTER — Emergency Department (HOSPITAL_COMMUNITY)
Admission: EM | Admit: 2022-05-05 | Discharge: 2022-05-06 | Disposition: A | Discharge status: Home / Self Care | Payer: BC Managed Care – PPO | Attending: Emergency Medicine | Admitting: Emergency Medicine

## 2022-05-05 ENCOUNTER — Other Ambulatory Visit: Payer: Self-pay

## 2022-05-05 DIAGNOSIS — Z452 Encounter for adjustment and management of vascular access device: Secondary | ICD-10-CM | POA: Insufficient documentation

## 2022-05-05 DIAGNOSIS — Z0389 Encounter for observation for other suspected diseases and conditions ruled out: Secondary | ICD-10-CM | POA: Diagnosis not present

## 2022-05-05 NOTE — ED Triage Notes (Signed)
Patient coming to ED for evaluation of  port-a-cath leaking.  Reports she had port placed on Monday.  Yesterday had chemo started and went home with continuous infusion.  Tonight noticed bleeding and leaking around catheter.  Currently receiving Fluorouracil through infusion.  No reports of fever or pain.

## 2022-05-06 ENCOUNTER — Inpatient Hospital Stay: Payer: BC Managed Care – PPO

## 2022-05-06 ENCOUNTER — Emergency Department (HOSPITAL_COMMUNITY): Payer: BC Managed Care – PPO

## 2022-05-06 DIAGNOSIS — C259 Malignant neoplasm of pancreas, unspecified: Secondary | ICD-10-CM

## 2022-05-06 DIAGNOSIS — Z0389 Encounter for observation for other suspected diseases and conditions ruled out: Secondary | ICD-10-CM | POA: Diagnosis not present

## 2022-05-06 MED ORDER — SODIUM CHLORIDE 0.9 % IV SOLN
1250.0000 mg | INTRAVENOUS | Status: DC
  Administered 2022-05-06: 1250 mg via INTRAVENOUS
  Filled 2022-05-06: qty 25

## 2022-05-06 MED ORDER — SODIUM CHLORIDE 0.9 % IV SOLN: Freq: Once | INTRAVENOUS | Status: AC

## 2022-05-06 NOTE — ED Notes (Signed)
D/t leakage at the port deaccessed site, new bandage applied.  Telfa pad applied, secured with Mepliex and a large Tegaderm applied on top of Mepliex. Boarders around Tegaderm secured with medipore soft cloth surgical tape.

## 2022-05-06 NOTE — Progress Notes (Signed)
Pt reports waking from sleep last night and noticed there was blood under her PAC dressing. Pt stopped pump at 1200am at home and arrived to ED for assessment. ED deaccessed PAC and placed pressure dressing at site. Xray showed PAC in correct placement. Appears upon discussion in infusion room that pt may have inadvertently dislodged the PAC needle during her sleep. PAC area appears c/d/I, no swelling or bleeding noted upon assessment in infusion. PAC accessed without issue. Per Dr Lorenso Courier remake remainder of 5FU and have pt infuse until 0800 05/07/22. Pt aware and agreeable. Per Dr Lorenso Courier ok for NS infusion while awaiting 5FU to be made.

## 2022-05-06 NOTE — Progress Notes (Signed)
Order entered for 5FU CIV 1250mg  (44 mL) remaining in CIV bag. 1250 mg to infuse over 20 hours. Pump stopped on 3/29 @ 1200.  Raul Del [place], [place], BCPS, BCOP 05/06/2022 11:25 AM

## 2022-05-06 NOTE — Patient Instructions (Signed)
[facility]  Discharge Instructions: Thank you for choosing [facility] to provide your oncology and hematology care.   If you have a lab appointment with the [facility], please go directly to the [facility] and check in at the registration area.   Wear comfortable clothing and clothing appropriate for easy access to any Portacath or PICC line.   We strive to give you quality time with your provider. You may need to reschedule your appointment if you arrive late (15 or more minutes).  Arriving late affects you and other patients whose appointments are after yours.  Also, if you miss three or more appointments without notifying the office, you may be dismissed from the clinic at the provider's discretion.      For prescription refill requests, have your pharmacy contact our office and allow 72 hours for refills to be completed.    Today you received the following chemotherapy and/or immunotherapy agents: fluorouracil       To help prevent nausea and vomiting after your treatment, we encourage you to take your nausea medication as directed.  BELOW ARE SYMPTOMS THAT SHOULD BE REPORTED IMMEDIATELY: *FEVER GREATER THAN 100.4 F (38 C) OR HIGHER *CHILLS OR SWEATING *NAUSEA AND VOMITING THAT IS NOT CONTROLLED WITH YOUR NAUSEA MEDICATION *UNUSUAL SHORTNESS OF BREATH *UNUSUAL BRUISING OR BLEEDING *URINARY PROBLEMS (pain or burning when urinating, or frequent urination) *BOWEL PROBLEMS (unusual diarrhea, constipation, pain near the anus) TENDERNESS IN MOUTH AND THROAT WITH OR WITHOUT PRESENCE OF ULCERS (sore throat, sores in mouth, or a toothache) UNUSUAL RASH, SWELLING OR PAIN  UNUSUAL VAGINAL DISCHARGE OR ITCHING   Items with * indicate a potential emergency and should be followed up as soon as possible or go to the Emergency Department if any problems should occur.  Please show the CHEMOTHERAPY ALERT CARD or IMMUNOTHERAPY ALERT CARD at  check-in to the Emergency Department and triage nurse.  Should you have questions after your visit or need to cancel or reschedule your appointment, please contact [facility]  Dept: [phone]  and follow the prompts.  Office hours are 8:00 a.m. to 4:30 p.m. Monday - Friday. Please note that voicemails left after 4:00 p.m. may not be returned until the following business day.  We are closed weekends and major holidays. You have access to a nurse at all times for urgent questions. Please call the main number to the clinic Dept: [phone] and follow the prompts.   For any non-urgent questions, you may also contact your provider using MyChart. We now offer e-Visits for anyone 16 and older to request care online for non-urgent symptoms. For details visit [email or web address].   Also download the MyChart app! Go to the app store, search "MyChart", open the app, select [facility], and log in with your MyChart username and password.  Rehydration, Adult Rehydration is the replacement of fluids, salts, and minerals in the body (electrolytes) that are lost during dehydration. Dehydration is when there is not enough water or other fluids in the body. This happens when you lose more fluids than you take in. Common causes of dehydration include: Not drinking enough fluids. This can occur when you are ill or doing activities that require a lot of energy, especially in hot weather. Conditions that cause loss of water or other fluids. These include diarrhea, vomiting, sweating, and urinating a lot. Other illnesses, such as fever or infection. Certain medicines, such as those that  remove excess fluid from the body (diuretics). Symptoms of mild or moderate dehydration may include thirst, dry lips and mouth, and dizziness. Symptoms of severe dehydration may include increased heart rate, confusion, fainting, and not urinating. In severe cases, you may need to get fluids  through an IV at the hospital. For mild or moderate cases, you can usually rehydrate at home by drinking certain fluids as told by your health care provider. What are the risks? Your health care provider will talk with you about risks. Your health care provider will talk with you about risks. This may include taking in too much fluid (overhydration). This is rare. Overhydration can cause an imbalance of electrolytes in the body, kidney failure, or a decrease in salt (sodium) levels in the body. Supplies needed: You will need an oral rehydration solution (ORS) if your health care provider tells you to use one. This is a drink to treat dehydration. It can be found in pharmacies and retail stores. How to rehydrate Fluids Follow instructions from your health care provider about what to drink. The kind of fluid and the amount you should drink depend on your condition. In general, you should choose drinks that you prefer. If told by your health care provider, drink an ORS. Make an ORS by following instructions on the package. Start by drinking small amounts, about  cup (120 mL) every 5-10 minutes. Slowly increase how much you drink until you have taken in the amount recommended by your health care provider. Drink enough clear fluids to keep your urine pale yellow. If you were told to drink an ORS, finish it first, then start slowly drinking other clear fluids. Drink fluids such as: Water. This includes sparkling and flavored water. Drinking only water can lead to having too little sodium in your body (hyponatremia). Follow the advice of your health care provider. Water from ice chips you suck on. Fruit juice with water added to it (diluted). Sports drinks. Hot or cold herbal teas. Broth-based soups. Milk or milk products. Food Follow instructions from your health care provider about what to eat while you rehydrate. Your health care provider may recommend that you slowly begin eating regular foods in  small amounts. Eat foods that contain a healthy balance of electrolytes, such as bananas, oranges, potatoes, tomatoes, and spinach. Avoid foods that are greasy or contain a lot of sugar. In some cases, you may get nutrition through a feeding tube that is passed through your nose and into your stomach (nasogastric tube, or NG tube). This may be done if you have uncontrolled vomiting or diarrhea. Drinks to avoid  Certain drinks may make dehydration worse. While you rehydrate, avoid drinking alcohol. How to tell if you are recovering from dehydration You may be getting better if: You are urinating more often than before you started rehydrating. Your urine is pale yellow. Your energy level improves. You vomit less often. You have diarrhea less often. Your appetite improves or returns to normal. You feel less dizzy or light-headed. Your skin tone and color start to look more normal. Follow these instructions at home: Take over-the-counter and prescription medicines only as told by your health care provider. Do not take sodium tablets. Doing this can lead to having too much sodium in your body (hypernatremia). Contact a health care provider if: You continue to have symptoms of mild or moderate dehydration, such as: Thirst. Dry lips. Slightly dry mouth. Dizziness. Dark urine or less urine than normal. Muscle cramps. You continue to vomit or  have diarrhea. Get help right away if: You have symptoms of dehydration that get worse. You have a fever. You have a severe headache. You have been vomiting and have problems, such as: Your vomiting gets worse or does not go away. Your vomit includes blood or green matter (bile). You cannot eat or drink without vomiting. You have problems with urination or bowel movements, such as: Diarrhea that gets worse or does not go away. Blood in your stool (feces). This may cause stool to look black and tarry. Not urinating, or urinating only a small amount  of very dark urine, within 6-8 hours. You have trouble breathing. You have symptoms that get worse with treatment. These symptoms may be an emergency. Get help right away. Call 911. Do not wait to see if the symptoms will go away. Do not drive yourself to the hospital. This information is not intended to replace advice given to you by your health care provider. Make sure you discuss any questions you have with your health care provider. Document Revised: 06/07/2021 Document Reviewed: 06/07/2021 Elsevier Patient Education  [place].

## 2022-05-06 NOTE — ED Provider Notes (Signed)
[facility] Provider Note   CSN: [id number] Arrival date & time: 05/05/22  2310     History  Chief Complaint  Patient presents with   Vascular Access Problem    Kaitlin Ferrell is a 57 y.o. female.  Patient presents with concerns over her new Port-A-Cath.  Patient had a Port-A-Cath placed by IR yesterday to receive chemotherapy.  She is receiving an infusion currently.  She reports waking up tonight and noticing that there is blood under the dressing.       Home Medications Prior to Admission medications   Medication Sig Start Date End Date Taking? Authorizing Provider  amLODipine (NORVASC) 10 MG tablet Take 1 tablet by mouth daily. 12/08/21  Yes [provider]  Cholecalciferol 1.25 MG (50000 UT) capsule Take 50,000 Units by mouth daily. 03/23/22  Yes [provider]  citalopram (CELEXA) 10 MG tablet Take by mouth. 03/22/22 06/20/22 Yes [provider]  diphenoxylate-atropine (LOMOTIL) 2.5-0.025 MG tablet Take 1 tablet by mouth 4 (four) times daily as needed for diarrhea or loose stools. 04/11/22  Yes Dede Query T, PA-C  eletriptan (RELPAX) 40 MG tablet Take 1 tablet (40 mg total) by mouth as needed for migraine or headache. May repeat in 2 hours if needed. 09/22/14  Yes Dennie Bible, NP  hydrochlorothiazide (MICROZIDE) 12.5 MG capsule Take 12.5 mg by mouth daily as needed (swelling). 01/27/20  Yes [provider]  magic mouthwash (multi-ingredient) oral suspension Swish and swallow 5 mLs by mouth 4 times daily as needed for throat discomfort 04/26/22  Yes Dede Query T, PA-C  magnesium oxide (MAG-OX) 400 (241.3 MG) MG tablet TAKE 1 TABLET BY MOUTH TWICE A DAY 11/16/14  Yes Dennie Bible, NP  morphine (MS CONTIN) 30 MG 12 hr tablet Take 1 tablet (30 mg total) by mouth every 12 (twelve) hours. 04/15/22  Yes Dede Query T, PA-C  OLANZapine (ZYPREXA) 10 MG tablet TAKE 1 TABLET BY  MOUTH EVERYDAY AT BEDTIME 04/20/22  Yes Thayil, Irene T, PA-C  promethazine (PHENERGAN) 25 MG tablet Take 1 tablet (25 mg total) by mouth every 6 (six) hours as needed for nausea or vomiting. 05/04/22  Yes Orson Slick, MD  Riboflavin (B2) 100 MG TABS Take 1 tablet by mouth 2 (two) times daily. 02/05/14  Yes [provider]  scopolamine (TRANSDERM-SCOP) 1 MG/3DAYS Place 1 patch (1.5 mg total) onto the skin every 3 (three) days. 04/14/22  Yes Lincoln Brigham, PA-C  HYDROcodone-acetaminophen (NORCO) 10-325 MG tablet Take 1 tablet by mouth every 6 (six) hours as needed. Patient not taking: Reported on 05/04/2022 04/15/22   Lincoln Brigham, PA-C  hydrocodone-ibuprofen (VICOPROFEN) 5-200 MG tablet Take 1-2 tablets by mouth every 6 (six) hours as needed for pain. 05/04/22   Orson Slick, MD  lidocaine-prilocaine (EMLA) cream Apply 1 Application topically as needed. 03/14/22   Lincoln Brigham, PA-C  LORazepam (ATIVAN) 0.5 MG tablet Take 0.5 mg by mouth as needed for anxiety. 03/22/22   [provider]  ondansetron (ZOFRAN) 8 MG tablet Take 1 tablet (8 mg total) by mouth every 8 (eight) hours as needed for nausea or vomiting. Patient not taking: Reported on 05/06/2022 03/14/22   Lincoln Brigham, PA-C      Allergies    Compazine [prochlorperazine edisylate], Propofol, Oxycodone-acetaminophen, Prochlorperazine, and Labetalol    Review of Systems   Review of Systems  Physical Exam Updated Vital Signs BP 137/80  Pulse 84   Temp 98.5 F (36.9 C) (Oral)   Resp 17   LMP 06/26/2013 Comment: low dose BCP  SpO2 98%  Physical Exam Vitals and nursing note reviewed.  Constitutional:      General: She is not in acute distress.    Appearance: She is well-developed.  HENT:     Head: Normocephalic and atraumatic.     Mouth/Throat:     Mouth: Mucous membranes are moist.  Eyes:     General: Vision grossly intact. Gaze aligned appropriately.     Extraocular Movements: Extraocular movements  intact.     Conjunctiva/sclera: Conjunctivae normal.  Cardiovascular:     Rate and Rhythm: Normal rate and regular rhythm.     Pulses: Normal pulses.     Heart sounds: Normal heart sounds, S1 normal and S2 normal. No murmur heard.    No friction rub. No gallop.  Pulmonary:     Effort: Pulmonary effort is normal. No respiratory distress.     Breath sounds: Normal breath sounds.  Abdominal:     General: Bowel sounds are normal.     Palpations: Abdomen is soft.     Tenderness: There is no abdominal tenderness. There is no guarding or rebound.     Hernia: No hernia is present.  Musculoskeletal:        General: No swelling.     Cervical back: Full passive range of motion without pain, normal range of motion and neck supple. No spinous process tenderness or muscular tenderness. Normal range of motion.     Right lower leg: No edema.     Left lower leg: No edema.  Skin:    General: Skin is warm and dry.     Capillary Refill: Capillary refill takes less than 2 seconds.     Findings: No ecchymosis, erythema, rash or wound.  Neurological:     General: No focal deficit present.     Mental Status: She is alert and oriented to person, place, and time.     GCS: GCS eye subscore is 4. GCS verbal subscore is 5. GCS motor subscore is 6.     Cranial Nerves: Cranial nerves 2-12 are intact.     Sensory: Sensation is intact.     Motor: Motor function is intact.     Coordination: Coordination is intact.  Psychiatric:        Attention and Perception: Attention normal.        Mood and Affect: Mood normal.        Speech: Speech normal.        Behavior: Behavior normal.     ED Results / Procedures / Treatments   Labs (all labs ordered are listed, but only abnormal results are displayed) Labs Reviewed - No data to display  EKG None  Radiology DG Chest [facility] 1 View  Result Date: 05/06/2022 CLINICAL DATA:  Leaking Port-A-Cath EXAM: PORTABLE CHEST 1 VIEW COMPARISON:  04/14/2022, 05/02/2022  FINDINGS: New left-sided chest wall port is noted with catheter tip in the right atrium. Port needle appears well placed on this single view. Cardiac shadow is within normal limits. Lungs are clear bilaterally. No bony abnormality is noted. IMPRESSION: No acute abnormality is noted.  Port needle appears well placed. Electronically Signed   By: Inez Catalina M.D.   On: 05/06/2022 00:33    Procedures Procedures    Medications Ordered in ED Medications - No data to display  ED Course/ Medical Decision Making/ A&P  Medical Decision Making Amount and/or Complexity of Data Reviewed Radiology: ordered.   Patient presents with complaints of bleeding from Port-A-Cath.  Diagnosis considered includes cellulitis, tubing problem, Port-A-Cath failure  Patient has her port accessed and is receiving infusion.  The pump was stopped.  Patient had a chest x-ray that does not show any problems with the subcutaneous port.  Port was de accessed.  There is some oozing of blood from the access site.  This was addressed with a pressure dressing.  Skin appears healthy, no signs of cellulitis, no signs of reaction from possible extravasation.  Patient has follow-up at oncology in the morning, will keep the pump off, deaccessed and it can be restarted tomorrow.        Final Clinical Impression(s) / ED Diagnoses Final diagnoses:  Encounter for deaccessing implanted intravenous port    Rx / DC Orders ED Discharge Orders     None         Isley Zinni, Gwenyth Allegra, MD 05/06/22 [phone]

## 2022-05-06 NOTE — Discharge Instructions (Signed)
Keep a pressure dressing on the area tonight to stop the bleeding.  Follow-up with oncology tomorrow in the office for a recheck.

## 2022-05-07 ENCOUNTER — Inpatient Hospital Stay: Payer: BC Managed Care – PPO

## 2022-05-07 VITALS — BP 147/95 | HR 93 | Temp 97.9°F | Resp 18

## 2022-05-07 DIAGNOSIS — Z5111 Encounter for antineoplastic chemotherapy: Secondary | ICD-10-CM | POA: Diagnosis not present

## 2022-05-07 DIAGNOSIS — R7989 Other specified abnormal findings of blood chemistry: Secondary | ICD-10-CM | POA: Diagnosis not present

## 2022-05-07 DIAGNOSIS — D701 Agranulocytosis secondary to cancer chemotherapy: Secondary | ICD-10-CM | POA: Diagnosis not present

## 2022-05-07 DIAGNOSIS — C787 Secondary malignant neoplasm of liver and intrahepatic bile duct: Secondary | ICD-10-CM | POA: Diagnosis not present

## 2022-05-07 DIAGNOSIS — C259 Malignant neoplasm of pancreas, unspecified: Secondary | ICD-10-CM

## 2022-05-07 DIAGNOSIS — M549 Dorsalgia, unspecified: Secondary | ICD-10-CM | POA: Diagnosis not present

## 2022-05-07 DIAGNOSIS — E876 Hypokalemia: Secondary | ICD-10-CM | POA: Diagnosis not present

## 2022-05-07 DIAGNOSIS — R197 Diarrhea, unspecified: Secondary | ICD-10-CM | POA: Diagnosis not present

## 2022-05-07 DIAGNOSIS — C250 Malignant neoplasm of head of pancreas: Secondary | ICD-10-CM | POA: Diagnosis not present

## 2022-05-07 DIAGNOSIS — D709 Neutropenia, unspecified: Secondary | ICD-10-CM | POA: Diagnosis not present

## 2022-05-07 DIAGNOSIS — Z803 Family history of malignant neoplasm of breast: Secondary | ICD-10-CM | POA: Diagnosis not present

## 2022-05-07 DIAGNOSIS — R112 Nausea with vomiting, unspecified: Secondary | ICD-10-CM | POA: Diagnosis not present

## 2022-05-07 DIAGNOSIS — Z95828 Presence of other vascular implants and grafts: Secondary | ICD-10-CM | POA: Diagnosis not present

## 2022-05-07 DIAGNOSIS — Z5189 Encounter for other specified aftercare: Secondary | ICD-10-CM | POA: Diagnosis not present

## 2022-05-07 DIAGNOSIS — Z452 Encounter for adjustment and management of vascular access device: Secondary | ICD-10-CM | POA: Diagnosis not present

## 2022-05-07 MED ORDER — HEPARIN SOD (PORK) LOCK FLUSH 100 UNIT/ML IV SOLN
500.0000 [IU] | Freq: Once | INTRAVENOUS | Status: AC | PRN
  Administered 2022-05-07: 500 [IU]

## 2022-05-07 MED ORDER — PEGFILGRASTIM-CBQV 6 MG/0.6ML ~~LOC~~ SOSY
6.0000 mg | PREFILLED_SYRINGE | Freq: Once | SUBCUTANEOUS | Status: AC
  Administered 2022-05-07: 6 mg via SUBCUTANEOUS

## 2022-05-07 MED ORDER — SODIUM CHLORIDE 0.9% FLUSH
10.0000 mL | INTRAVENOUS | Status: DC | PRN
  Administered 2022-05-07: 10 mL

## 2022-05-09 ENCOUNTER — Encounter (HOSPITAL_COMMUNITY): Payer: Self-pay

## 2022-05-09 ENCOUNTER — Observation Stay (HOSPITAL_COMMUNITY)
Admission: EM | Admit: 2022-05-09 | Discharge: 2022-05-10 | Disposition: A | Discharge status: Home / Self Care | Payer: BC Managed Care – PPO | Attending: Internal Medicine | Admitting: Internal Medicine

## 2022-05-09 ENCOUNTER — Other Ambulatory Visit: Payer: Self-pay

## 2022-05-09 DIAGNOSIS — R197 Diarrhea, unspecified: Secondary | ICD-10-CM | POA: Diagnosis not present

## 2022-05-09 DIAGNOSIS — Z79899 Other long term (current) drug therapy: Secondary | ICD-10-CM | POA: Insufficient documentation

## 2022-05-09 DIAGNOSIS — K219 Gastro-esophageal reflux disease without esophagitis: Secondary | ICD-10-CM | POA: Diagnosis not present

## 2022-05-09 DIAGNOSIS — R112 Nausea with vomiting, unspecified: Secondary | ICD-10-CM | POA: Diagnosis not present

## 2022-05-09 DIAGNOSIS — C259 Malignant neoplasm of pancreas, unspecified: Secondary | ICD-10-CM | POA: Insufficient documentation

## 2022-05-09 DIAGNOSIS — C787 Secondary malignant neoplasm of liver and intrahepatic bile duct: Secondary | ICD-10-CM | POA: Diagnosis not present

## 2022-05-09 DIAGNOSIS — E876 Hypokalemia: Secondary | ICD-10-CM | POA: Diagnosis present

## 2022-05-09 DIAGNOSIS — T451X5A Adverse effect of antineoplastic and immunosuppressive drugs, initial encounter: Secondary | ICD-10-CM | POA: Diagnosis present

## 2022-05-09 DIAGNOSIS — I10 Essential (primary) hypertension: Secondary | ICD-10-CM | POA: Diagnosis not present

## 2022-05-09 DIAGNOSIS — Z95828 Presence of other vascular implants and grafts: Secondary | ICD-10-CM

## 2022-05-09 LAB — CBC WITH DIFFERENTIAL/PLATELET
Abs Immature Granulocytes: 0.4 10*3/uL — ABNORMAL HIGH (ref 0.00–0.07)
Band Neutrophils: 5 %
Basophils Absolute: 0 10*3/uL (ref 0.0–0.1)
Basophils Relative: 0 %
Eosinophils Absolute: 0 10*3/uL (ref 0.0–0.5)
Eosinophils Relative: 0 %
HCT: 41.7 % (ref 36.0–46.0)
Hemoglobin: 13.6 g/dL (ref 12.0–15.0)
Lymphocytes Relative: 13 %
Lymphs Abs: 1.2 10*3/uL (ref 0.7–4.0)
MCH: 27.4 pg (ref 26.0–34.0)
MCHC: 32.6 g/dL (ref 30.0–36.0)
MCV: 83.9 fL (ref 80.0–100.0)
Metamyelocytes Relative: 2 %
Monocytes Absolute: 0.3 10*3/uL (ref 0.1–1.0)
Monocytes Relative: 3 %
Myelocytes: 2 %
Neutro Abs: 7.3 10*3/uL (ref 1.7–7.7)
Neutrophils Relative %: 75 %
Platelets: 101 10*3/uL — ABNORMAL LOW (ref 150–400)
RBC: 4.97 MIL/uL (ref 3.87–5.11)
RDW: 14.1 % (ref 11.5–15.5)
WBC: 9.1 10*3/uL (ref 4.0–10.5)
nRBC: 0 % (ref 0.0–0.2)

## 2022-05-09 LAB — CREATININE, SERUM
Creatinine, Ser: 0.61 mg/dL (ref 0.44–1.00)
GFR, Estimated: >60 mL/min (ref >60)

## 2022-05-09 LAB — CBC
HCT: 35.7 % — ABNORMAL LOW (ref 36.0–46.0)
Hemoglobin: 11.6 g/dL — ABNORMAL LOW (ref 12.0–15.0)
MCH: 27.4 pg (ref 26.0–34.0)
MCHC: 32.5 g/dL (ref 30.0–36.0)
MCV: 84.2 fL (ref 80.0–100.0)
Platelets: 83 10*3/uL — ABNORMAL LOW (ref 150–400)
RBC: 4.24 MIL/uL (ref 3.87–5.11)
RDW: 14.3 % (ref 11.5–15.5)
WBC: 7.6 10*3/uL (ref 4.0–10.5)
nRBC: 0 % (ref 0.0–0.2)

## 2022-05-09 LAB — MAGNESIUM: Magnesium: 2.2 mg/dL (ref 1.7–2.4)

## 2022-05-09 LAB — COMPREHENSIVE METABOLIC PANEL
ALT: 63 U/L — ABNORMAL HIGH (ref 0–44)
AST: 50 U/L — ABNORMAL HIGH (ref 15–41)
Albumin: 4.7 g/dL (ref 3.5–5.0)
Alkaline Phosphatase: 182 U/L — ABNORMAL HIGH (ref 38–126)
Anion gap: 15 (ref 5–15)
BUN: 20 mg/dL (ref 6–20)
CO2: 24 mmol/L (ref 22–32)
Calcium: 9.9 mg/dL (ref 8.9–10.3)
Chloride: 95 mmol/L — ABNORMAL LOW (ref 98–111)
Creatinine, Ser: 0.78 mg/dL (ref 0.44–1.00)
GFR, Estimated: >60 mL/min (ref >60)
Glucose, Bld: 130 mg/dL — ABNORMAL HIGH (ref 70–99)
Potassium: 2.9 mmol/L — ABNORMAL LOW (ref 3.5–5.1)
Sodium: 134 mmol/L — ABNORMAL LOW (ref 135–145)
Total Bilirubin: 0.9 mg/dL (ref 0.3–1.2)
Total Protein: 8.7 g/dL — ABNORMAL HIGH (ref 6.5–8.1)

## 2022-05-09 LAB — LIPASE, BLOOD: Lipase: 70 U/L — ABNORMAL HIGH (ref 11–51)

## 2022-05-09 LAB — HIV ANTIBODY (ROUTINE TESTING W REFLEX): HIV Screen 4th Generation wRfx: NONREACTIVE

## 2022-05-09 LAB — POTASSIUM: Potassium: 4 mmol/L (ref 3.5–5.1)

## 2022-05-09 MED ORDER — OLANZAPINE 10 MG PO TABS
10.0000 mg | ORAL_TABLET | Freq: Every day | ORAL | Status: DC
  Administered 2022-05-09: 10 mg via ORAL
  Filled 2022-05-09: qty 1
  Filled 2022-05-09: qty 2

## 2022-05-09 MED ORDER — HYDROCODONE-IBUPROFEN 5-200 MG PO TABS: 1.0000 | ORAL_TABLET | Freq: Four times a day (QID) | ORAL | Status: DC | PRN

## 2022-05-09 MED ORDER — POTASSIUM CHLORIDE CRYS ER 20 MEQ PO TBCR
40.0000 meq | EXTENDED_RELEASE_TABLET | Freq: Once | ORAL | Status: DC
  Filled 2022-05-09: qty 2

## 2022-05-09 MED ORDER — POTASSIUM CHLORIDE 10 MEQ/100ML IV SOLN
10.0000 meq | Freq: Once | INTRAVENOUS | Status: AC
  Administered 2022-05-09: 10 meq via INTRAVENOUS
  Filled 2022-05-09: qty 100

## 2022-05-09 MED ORDER — HYDROMORPHONE HCL 1 MG/ML IJ SOLN: 0.5000 mg | INTRAMUSCULAR | Status: DC | PRN

## 2022-05-09 MED ORDER — POTASSIUM CHLORIDE IN NACL 20-0.9 MEQ/L-% IV SOLN
INTRAVENOUS | Status: DC
  Filled 2022-05-09 (×2): qty 1000

## 2022-05-09 MED ORDER — SCOPOLAMINE 1 MG/3DAYS TD PT72: 1.0000 | MEDICATED_PATCH | TRANSDERMAL | Status: DC

## 2022-05-09 MED ORDER — HYDRALAZINE HCL 20 MG/ML IJ SOLN: 10.0000 mg | Freq: Four times a day (QID) | INTRAMUSCULAR | Status: DC | PRN

## 2022-05-09 MED ORDER — ONDANSETRON HCL 4 MG/2ML IJ SOLN
4.0000 mg | Freq: Once | INTRAMUSCULAR | Status: AC
  Administered 2022-05-09: 4 mg via INTRAVENOUS
  Filled 2022-05-09: qty 2

## 2022-05-09 MED ORDER — POTASSIUM CHLORIDE CRYS ER 20 MEQ PO TBCR
40.0000 meq | EXTENDED_RELEASE_TABLET | Freq: Once | ORAL | Status: AC
  Administered 2022-05-09: 40 meq via ORAL
  Filled 2022-05-09: qty 2

## 2022-05-09 MED ORDER — TRAMADOL HCL 50 MG PO TABS: 100.0000 mg | ORAL_TABLET | Freq: Four times a day (QID) | ORAL | Status: DC | PRN

## 2022-05-09 MED ORDER — POTASSIUM CHLORIDE 10 MEQ/100ML IV SOLN: 10.0000 meq | INTRAVENOUS | Status: DC

## 2022-05-09 MED ORDER — METOCLOPRAMIDE HCL 5 MG/ML IJ SOLN
10.0000 mg | Freq: Once | INTRAMUSCULAR | Status: AC
  Administered 2022-05-09: 10 mg via INTRAVENOUS
  Filled 2022-05-09: qty 2

## 2022-05-09 MED ORDER — PANTOPRAZOLE SODIUM 40 MG IV SOLR
40.0000 mg | INTRAVENOUS | Status: DC
  Administered 2022-05-09 – 2022-05-10 (×2): 40 mg via INTRAVENOUS
  Filled 2022-05-09 (×2): qty 10

## 2022-05-09 MED ORDER — ENOXAPARIN SODIUM 40 MG/0.4ML IJ SOSY
40.0000 mg | PREFILLED_SYRINGE | INTRAMUSCULAR | Status: DC
  Administered 2022-05-09: 40 mg via SUBCUTANEOUS
  Filled 2022-05-09: qty 0.4

## 2022-05-09 MED ORDER — ONDANSETRON HCL 4 MG/2ML IJ SOLN: 4.0000 mg | Freq: Four times a day (QID) | INTRAMUSCULAR | Status: DC | PRN

## 2022-05-09 MED ORDER — SODIUM CHLORIDE 0.9 % IV BOLUS
1000.0000 mL | Freq: Once | INTRAVENOUS | Status: AC
  Administered 2022-05-09: 1000 mL via INTRAVENOUS

## 2022-05-09 MED ORDER — MORPHINE SULFATE ER 15 MG PO TBCR
30.0000 mg | EXTENDED_RELEASE_TABLET | Freq: Two times a day (BID) | ORAL | Status: DC
  Administered 2022-05-09 – 2022-05-10 (×2): 30 mg via ORAL
  Filled 2022-05-09 (×3): qty 2

## 2022-05-09 MED ORDER — POTASSIUM CHLORIDE 10 MEQ/50ML IV SOLN
10.0000 meq | INTRAVENOUS | Status: AC
  Administered 2022-05-09 (×6): 10 meq via INTRAVENOUS
  Filled 2022-05-09 (×6): qty 50

## 2022-05-09 MED ORDER — METOCLOPRAMIDE HCL 5 MG/ML IJ SOLN
10.0000 mg | INTRAMUSCULAR | Status: AC
  Administered 2022-05-09: 10 mg via INTRAVENOUS
  Filled 2022-05-09: qty 2

## 2022-05-09 MED ORDER — LORAZEPAM 0.5 MG PO TABS: 0.5000 mg | ORAL_TABLET | ORAL | Status: DC | PRN

## 2022-05-09 MED ORDER — SENNOSIDES-DOCUSATE SODIUM 8.6-50 MG PO TABS: 1.0000 | ORAL_TABLET | Freq: Every evening | ORAL | Status: DC | PRN

## 2022-05-09 MED ORDER — AMLODIPINE BESYLATE 5 MG PO TABS
10.0000 mg | ORAL_TABLET | Freq: Every day | ORAL | Status: DC
  Filled 2022-05-09 (×2): qty 2

## 2022-05-09 MED ORDER — ONDANSETRON HCL 4 MG PO TABS: 4.0000 mg | ORAL_TABLET | Freq: Four times a day (QID) | ORAL | Status: DC | PRN

## 2022-05-09 MED ORDER — ELETRIPTAN HYDROBROMIDE 40 MG PO TABS: 40.0000 mg | ORAL_TABLET | ORAL | Status: DC | PRN

## 2022-05-09 MED ORDER — SCOPOLAMINE 1 MG/3DAYS TD PT72
1.0000 | MEDICATED_PATCH | TRANSDERMAL | Status: DC
  Administered 2022-05-10: 1.5 mg via TRANSDERMAL
  Filled 2022-05-09: qty 1

## 2022-05-09 MED ORDER — ALBUTEROL SULFATE (2.5 MG/3ML) 0.083% IN NEBU: 2.5000 mg | INHALATION_SOLUTION | RESPIRATORY_TRACT | Status: DC | PRN

## 2022-05-09 NOTE — ED Provider Notes (Signed)
Patient given at signout from Kaitlin Carnes, PA-C.  Please review her note for HPI, physical exam, workup.  Patient will receive fluids and IV Zofran be reevaluated.  Patient was hypokalemic and was given oral potassium however she had an episode emesis when given the potassium and so IV potassium and Reglan were ordered.  If able to tolerate p.o. with reassuring labs, patient may be discharged with oncology follow-up as her symptoms are mostly related to her chemo treatment.  Patient stated that she is feeling much better after receiving the Reglan and would like to try the potassium pill again.  Patient is able to tolerate water at this time orally.  If patient is able to tolerate potassium pill patient may be discharged as per the original plan.  If patient is unable to to tolerate potassium pill patient will be admitted for intractable nausea and vomiting and hypokalemia.  Patient stable at this time.  Patient was unable to tolerate potassium pills orally.  At this time hospitalist will be consulted for admission due to intractable nausea vomiting and hypokalemia.  Patient stable at this time.  Consults: Renaee Munda, MD Hospitalist  After speak with the hospitalist patient was accepted for admission.  Patient stable at this time for admission.  Patient given another round of Reglan as she endorsed continued nausea after the potassium pill.  .Critical Care  Performed by: Chuck Hint, PA-C Authorized by: Chuck Hint, PA-C   Critical care provider statement:    Critical care time (minutes):  40   Critical care time was exclusive of:  Separately billable procedures and treating other patients   Critical care was necessary to treat or prevent imminent or life-threatening deterioration of the following conditions: intractible N/V.   Critical care was time spent personally by me on the following activities:  Development of treatment plan with patient or surrogate, discussions with consultants,  evaluation of patient's response to treatment, examination of patient, obtaining history from patient or surrogate, review of old charts, re-evaluation of patient's condition, pulse oximetry, ordering and review of radiographic studies, ordering and review of laboratory studies and ordering and performing treatments and interventions   I assumed direction of critical care for this patient from another provider in my specialty: yes     Care discussed with: admitting provider       Chuck Hint, PA-C 05/09/22 1102    Molpus, Jenny Reichmann, MD 05/09/22 2234

## 2022-05-09 NOTE — H&P (Signed)
History and Physical  Kaitlin Ferrell [id number] DOB: Feb 08, 1966 DOA: 05/09/2022  PCP: Robyne Peers, MD   Chief Complaint: n/v   HPI: Kaitlin Ferrell is a 57 y.o. female with medical history significant for anxiety, hypertension, migraines and recently unfortunately diagnosis in January of this year of stage IV metastatic pancreatic cancer.  She is being treated with home chemotherapy infusions, which unfortunately are causing complications including severe nausea vomiting and diarrhea.  She recently had infusion via a new left chest port that ended 3 days ago, and the day after that as per usual, she started having nausea, and then intractable vomiting and watery diarrhea.  Denies any chest pain, fevers, chills, hematemesis or blood in her stool.  Unfortunately these are typical symptoms for after chemotherapy.  ED Course: In the emergency department, vital signs were relatively stable, blood pressure elevated 140/99.  CBC reveals platelet count of 101, BMP shows sodium 134, potassium 2.9, and normal renal function.  She was given IV fluids, and oral potassium but she threw it up.  Since she is unable to tolerate p.o. intake despite IV fluids and antinausea medication, hospitalist was contacted for admission.  Review of Systems: Please see HPI for pertinent positives and negatives. A complete 10 system review of systems are otherwise negative.  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   treated with 4 cycles of actinomycin D from 12-02-11 thru 01-13-12   H/O molar pregnancy, antepartum    Hypertension    Migraine    Miscarriage    Past Surgical History:  Procedure Laterality Date   BREAST BIOPSY  2001   Left   BREAST LUMPECTOMY     DILATION AND CURETTAGE OF UTERUS  1992, 09/2011   DILATION  AND EVACUATION  2011   INSERTION OF MESH N/A 09/03/2013   Procedure: INSERTION OF MESH;  Surgeon: Adin Hector, MD;  Location: [facility];  Service: General;  Laterality: N/A;   IR CHEST FLUORO  04/14/2022   IR IMAGING GUIDED PORT INSERTION  03/22/2022   IR IMAGING GUIDED PORT INSERTION  05/02/2022   IR PORT REPAIR CENTRAL VENOUS ACCESS DEVICE  04/19/2022   IR REMOVAL TUN CV CATH W/O FL  05/02/2022   IR US LIVER BIOPSY  03/22/2022   PORTACATH PLACEMENT  11/2011   PORTACATH PLACEMENT     REMOVAL  MARCH 2014   VENTRAL HERNIA REPAIR N/A 09/03/2013   Procedure: LAPAROSCOPIC VENTRAL WALL HERNIA REPAIR;  Surgeon: Adin Hector, MD;  Location: [facility];  Service: General;  Laterality: N/A;   WISDOM TOOTH EXTRACTION     in 11th grade    Social History:  reports that she has never smoked. She has never used smokeless tobacco. She reports current alcohol use of about 1.0 standard drink of alcohol per week. She reports that she does not use drugs.   Allergies  Allergen Reactions   Compazine [Prochlorperazine Edisylate]     Very agitated   Propofol Other (See Comments)    Headache   Oxycodone-Acetaminophen    Prochlorperazine    Labetalol Hives, Itching, Swelling and Rash    Family History  Problem Relation Age of Onset   Breast cancer Mother        dx > 86   Heart attack Mother    Diabetes Father  Breast cancer Maternal Aunt        dx > 50   Breast cancer Maternal Aunt        dx > 50   Breast cancer Maternal Grandmother        ? < 63   Stroke Paternal Grandmother    Alcoholism Other      Prior to Admission medications   Medication Sig Start Date End Date Taking? Authorizing Provider  amLODipine (NORVASC) 10 MG tablet Take 1 tablet by mouth daily. 12/08/21   [provider]  Cholecalciferol 1.25 MG (50000 UT) capsule Take 50,000 Units by mouth daily. 03/23/22   [provider]  citalopram (CELEXA) 10 MG tablet Take by mouth. 03/22/22 06/20/22  [provider]   diphenoxylate-atropine (LOMOTIL) 2.5-0.025 MG tablet Take 1 tablet by mouth 4 (four) times daily as needed for diarrhea or loose stools. 04/11/22   Lincoln Brigham, PA-C  eletriptan (RELPAX) 40 MG tablet Take 1 tablet (40 mg total) by mouth as needed for migraine or headache. May repeat in 2 hours if needed. 09/22/14   Dennie Bible, NP  hydrochlorothiazide (MICROZIDE) 12.5 MG capsule Take 12.5 mg by mouth daily as needed (swelling). 01/27/20   [provider]  HYDROcodone-acetaminophen (NORCO) 10-325 MG tablet Take 1 tablet by mouth every 6 (six) hours as needed. Patient not taking: Reported on 05/04/2022 04/15/22   Lincoln Brigham, PA-C  hydrocodone-ibuprofen (VICOPROFEN) 5-200 MG tablet Take 1-2 tablets by mouth every 6 (six) hours as needed for pain. 05/04/22   Orson Slick, MD  lidocaine-prilocaine (EMLA) cream Apply 1 Application topically as needed. 03/14/22   Lincoln Brigham, PA-C  LORazepam (ATIVAN) 0.5 MG tablet Take 0.5 mg by mouth as needed for anxiety. 03/22/22   [provider]  magic mouthwash (multi-ingredient) oral suspension Swish and swallow 5 mLs by mouth 4 times daily as needed for throat discomfort 04/26/22   Dede Query T, PA-C  magnesium oxide (MAG-OX) 400 (241.3 MG) MG tablet TAKE 1 TABLET BY MOUTH TWICE A DAY 11/16/14   Dennie Bible, NP  morphine (MS CONTIN) 30 MG 12 hr tablet Take 1 tablet (30 mg total) by mouth every 12 (twelve) hours. 04/15/22   Lincoln Brigham, PA-C  OLANZapine (ZYPREXA) 10 MG tablet TAKE 1 TABLET BY MOUTH EVERYDAY AT BEDTIME 04/20/22   Dede Query T, PA-C  ondansetron (ZOFRAN) 8 MG tablet Take 1 tablet (8 mg total) by mouth every 8 (eight) hours as needed for nausea or vomiting. Patient not taking: Reported on 05/06/2022 03/14/22   Lincoln Brigham, PA-C  promethazine (PHENERGAN) 25 MG tablet Take 1 tablet (25 mg total) by mouth every 6 (six) hours as needed for nausea or vomiting. 05/04/22   Orson Slick, MD  Riboflavin  (B2) 100 MG TABS Take 1 tablet by mouth 2 (two) times daily. 02/05/14   [provider]  scopolamine (TRANSDERM-SCOP) 1 MG/3DAYS Place 1 patch (1.5 mg total) onto the skin every 3 (three) days. 04/14/22   Lincoln Brigham, PA-C    Physical Exam: BP (!) 140/99   Pulse 98   Temp 98.5 F (36.9 C) (Oral)   Resp 20   Ht 5\' 2"  (1.575 m)   Wt 67.7 kg   LMP 06/26/2013 Comment: low dose BCP  SpO2 99%   BMI 27.29 kg/m   General:  Alert, oriented, she looks tired, but not chronically or acutely ill Eyes: EOMI, clear conjuctivae, white sclerea Neck: supple,  no masses, trachea mildline  Cardiovascular: RRR, no murmurs or rubs, no peripheral edema  Respiratory: clear to auscultation bilaterally, no wheezes, no crackles  Abdomen: soft, nontender, nondistended, normal bowel tones heard  Skin: dry, no rashes  Musculoskeletal: no joint effusions, normal range of motion  Psychiatric: appropriate affect, normal speech  Neurologic: extraocular muscles intact, clear speech, moving all extremities with intact sensorium          Labs on Admission:  Basic Metabolic Panel: Recent Labs  Lab 05/04/22 0926 05/09/22 0448  NA 142 134*  K 3.0* 2.9*  CL 106 95*  CO2 28 24  GLUCOSE 112* 130*  BUN 7 20  CREATININE 0.69 0.78  CALCIUM 9.2 9.9   Liver Function Tests: Recent Labs  Lab 05/04/22 0926 05/09/22 0448  AST 83* 50*  ALT 76* 63*  ALKPHOS 194* 182*  BILITOT 0.3 0.9  PROT 7.1 8.7*  ALBUMIN 3.9 4.7   Recent Labs  Lab 05/09/22 0448  LIPASE 70*   No results for input(s): "AMMONIA" in the last 168 hours. CBC: Recent Labs  Lab 05/04/22 0926 05/09/22 0448  WBC 9.1 9.1  NEUTROABS 5.9 7.3  HGB 10.5* 13.6  HCT 32.4* 41.7  MCV 85.7 83.9  PLT 124* 101*   Cardiac Enzymes: No results for input(s): "CKTOTAL", "CKMB", "CKMBINDEX", "TROPONINI" in the last 168 hours.  BNP (last 3 results) No results for input(s): "BNP" in the last 8760 hours.  ProBNP (last 3 results) No results  for input(s): "PROBNP" in the last 8760 hours.  CBG: No results for input(s): "GLUCAP" in the last 168 hours.  Radiological Exams on Admission: No results found.  Assessment/Plan Principal Problem:   Chemotherapy-induced nausea and vomiting -Observation admission to MedSurg -IV fluids -Clear liquid diet as tolerated -Home basal MS Contin ordered, with as needed p.o. narcotic and IV Dilaudid Active Problems:   Port-A-Cath in place-discussed with nursing staff, will access port   Essential hypertension-Home medications will be continued   Gastroesophageal reflux disease without esophagitis-due to nausea will change PPI to IV for now   Intractable nausea and vomiting   Hypokalemia-due to her intractable nausea and vomiting   Pancreatic carcinoma metastatic to liver  DVT prophylaxis: Lovenox     Code Status: Full Code, confirmed with patient  Consults called: None  Admission status: Observation  Time spent: 48 minutes  Naeemah Jasmer Neva Seat MD [facility] Pager [phone]  If 7PM-7AM, please contact night-coverage [email or web address] Password [facility]  05/09/2022, 10:41 AM

## 2022-05-09 NOTE — ED Triage Notes (Addendum)
Pt arrived POV for n/v/d and feeling fatigue. Pt reports receiving home chemo treatments, last treatment was Wednesday- Saturday. Pt reports the symptoms she is experiencing seem to happen every time she receives her treatment. Pt denies pain. Afebrile, NAD noted, pt A&Ox4.

## 2022-05-09 NOTE — ED Notes (Signed)
Pt provided water at this time.

## 2022-05-09 NOTE — ED Provider Notes (Addendum)
[place] Provider Note   CSN: [id number] Arrival date & time: 05/09/22  0418     History  Chief Complaint  Patient presents with   N/V/D  Fatigue    Kaitlin Ferrell Kaitlin Ferrell is a 57 y.o. female.  The history is provided by the patient and medical records.   57 year old female with history of metastatic pancreatic cancer with mets to liver, presenting to the ED with nausea and vomiting.  She is doing at home chemotherapy, infusions lasting approximately 3 days.  Most recent infusion Wednesday- Saturday.  States after chemo treatments she does tend to gets very nauseated with vomiting and some intermittent diarrhea.  She has not had relief with home medications.  She is still wearing scopolamine patch as well.  She denies any fever.  States she just feels incredibly fatigued and weak.  She was given injection of Udenyca on Saturday.  Home Medications Prior to Admission medications   Medication Sig Start Date End Date Taking? Authorizing Provider  amLODipine (NORVASC) 10 MG tablet Take 1 tablet by mouth daily. 12/08/21   [provider]  Cholecalciferol 1.25 MG (50000 UT) capsule Take 50,000 Units by mouth daily. 03/23/22   [provider]  citalopram (CELEXA) 10 MG tablet Take by mouth. 03/22/22 06/20/22  [provider]  diphenoxylate-atropine (LOMOTIL) 2.5-0.025 MG tablet Take 1 tablet by mouth 4 (four) times daily as needed for diarrhea or loose stools. 04/11/22   Lincoln Brigham, PA-C  eletriptan (RELPAX) 40 MG tablet Take 1 tablet (40 mg total) by mouth as needed for migraine or headache. May repeat in 2 hours if needed. 09/22/14   Dennie Bible, NP  hydrochlorothiazide (MICROZIDE) 12.5 MG capsule Take 12.5 mg by mouth daily as needed (swelling). 01/27/20   [provider]  HYDROcodone-acetaminophen (NORCO) 10-325 MG tablet Take 1 tablet by mouth every 6 (six) hours as needed. Patient not taking:  Reported on 05/04/2022 04/15/22   Lincoln Brigham, PA-C  hydrocodone-ibuprofen (VICOPROFEN) 5-200 MG tablet Take 1-2 tablets by mouth every 6 (six) hours as needed for pain. 05/04/22   Orson Slick, MD  lidocaine-prilocaine (EMLA) cream Apply 1 Application topically as needed. 03/14/22   Lincoln Brigham, PA-C  LORazepam (ATIVAN) 0.5 MG tablet Take 0.5 mg by mouth as needed for anxiety. 03/22/22   [provider]  magic mouthwash (multi-ingredient) oral suspension Swish and swallow 5 mLs by mouth 4 times daily as needed for throat discomfort 04/26/22   Dede Query T, PA-C  magnesium oxide (MAG-OX) 400 (241.3 MG) MG tablet TAKE 1 TABLET BY MOUTH TWICE A DAY 11/16/14   Dennie Bible, NP  morphine (MS CONTIN) 30 MG 12 hr tablet Take 1 tablet (30 mg total) by mouth every 12 (twelve) hours. 04/15/22   Lincoln Brigham, PA-C  OLANZapine (ZYPREXA) 10 MG tablet TAKE 1 TABLET BY MOUTH EVERYDAY AT BEDTIME 04/20/22   Dede Query T, PA-C  ondansetron (ZOFRAN) 8 MG tablet Take 1 tablet (8 mg total) by mouth every 8 (eight) hours as needed for nausea or vomiting. Patient not taking: Reported on 05/06/2022 03/14/22   Lincoln Brigham, PA-C  promethazine (PHENERGAN) 25 MG tablet Take 1 tablet (25 mg total) by mouth every 6 (six) hours as needed for nausea or vomiting. 05/04/22   Orson Slick, MD  Riboflavin (B2) 100 MG TABS Take 1 tablet by mouth 2 (two) times daily. 02/05/14   [provider]  scopolamine (TRANSDERM-SCOP) 1 MG/3DAYS Place 1 patch (1.5 mg total) onto the skin every 3 (three) days. 04/14/22   Lincoln Brigham, PA-C      Allergies    Compazine [prochlorperazine edisylate], Propofol, Oxycodone-acetaminophen, Prochlorperazine, and Labetalol    Review of Systems   Review of Systems  Gastrointestinal:  Positive for diarrhea, nausea and vomiting.  All other systems reviewed and are negative.   Physical Exam Updated Vital Signs BP (!) 137/97 (BP Location: Left Arm)   Pulse (!)  114   Temp 98.5 F (36.9 C) (Oral)   Resp 16   Ht 5\' 2"  (1.575 m)   Wt 67.7 kg   LMP 06/26/2013 Comment: low dose BCP  SpO2 (!) 9%   BMI 27.29 kg/m   Physical Exam Vitals and nursing note reviewed.  Constitutional:      Appearance: She is well-developed.     Comments: Appears weak  HENT:     Head: Normocephalic and atraumatic.     Mouth/Throat:     Mouth: Mucous membranes are dry.  Eyes:     Conjunctiva/sclera: Conjunctivae normal.     Pupils: Pupils are equal, round, and reactive to light.  Neck:     Comments: Scopolamine patch present Cardiovascular:     Rate and Rhythm: Normal rate and regular rhythm.     Heart sounds: Normal heart sounds.  Pulmonary:     Effort: Pulmonary effort is normal.     Breath sounds: Normal breath sounds.  Abdominal:     General: Bowel sounds are normal.     Palpations: Abdomen is soft.     Tenderness: There is no abdominal tenderness.     Comments: Normal bowel sounds, no distention  Musculoskeletal:        General: Normal range of motion.     Cervical back: Normal range of motion.  Skin:    General: Skin is warm and dry.  Neurological:     Mental Status: She is alert and oriented to person, place, and time.     ED Results / Procedures / Treatments   Labs (all labs ordered are listed, but only abnormal results are displayed) Labs Reviewed  COMPREHENSIVE METABOLIC PANEL - Abnormal; Notable for the following components:      Result Value   Sodium 134 (*)    Potassium 2.9 (*)    Chloride 95 (*)    Glucose, Bld 130 (*)    Total Protein 8.7 (*)    AST 50 (*)    ALT 63 (*)    Alkaline Phosphatase 182 (*)    All other components within normal limits  LIPASE, BLOOD - Abnormal; Notable for the following components:   Lipase 70 (*)    All other components within normal limits  CBC WITH DIFFERENTIAL/PLATELET    EKG None  Radiology No results found.  Procedures Procedures    Medications Ordered in ED Medications   potassium chloride SA (KLOR-CON M) CR tablet 40 mEq (has no administration in time range)  sodium chloride 0.9 % bolus 1,000 mL (1,000 mLs Intravenous New Bag/Given 05/09/22 0456)  ondansetron (ZOFRAN) injection 4 mg (4 mg Intravenous Given 05/09/22 0455)    ED Course/ Medical Decision Making/ A&P                             Medical Decision Making Amount and/or Complexity of Data Reviewed Labs: ordered. ECG/medicine tests: ordered and independent interpretation performed.  Risk  Prescription drug management.   57 year old female presenting to the ED for nausea, vomiting, and diarrhea.  As pancreatic cancer, currently undergoing at home chemotherapy infusions.  States she typically gets these after cycle which she just finished yesterday.  She denies any fevers.  States she feels weak and fatigued.  She does appear so on exam, dry mucous membranes.  Abdomen soft, non-tender.  Will obtain labs, IVF, anti-emetics.  6:20 AM Went to reassess patient-- assisted up to bathroom as she was having some diarrhea.  She is no longer tachycardic, ambulating well on her own.  K+ is low, suspect likely from poor oral intake and GI losses.  She does have elevated LFT's and alk phos but similar to prior values.  Lipase 70.  Oral replacement ordered.  CBC is still pending.  IVF infusing-- will resume after bathroom.  Care will be signed out to oncoming provider.  If improved, tolerating PO, and CBC ok, feel would be reasonable for discharge home with continued symptomatic care and close oncology follow-up.  6:38 AM Patient vomited up oral K+.  Will continue IVF, ordered IV KCl, reglan.  Oncoming provider aware of change in care plan.  Final Clinical Impression(s) / ED Diagnoses Final diagnoses:  Nausea vomiting and diarrhea    Rx / DC Orders ED Discharge Orders     None         Larene Pickett, PA-C 05/09/22 [place], Keywon Mestre M, PA-C 05/09/22 [id number]    Shanon Rosser, MD 05/09/22  [phone]

## 2022-05-09 NOTE — ED Notes (Signed)
Pt vomited after taking the first K+ tablet. PA notified.

## 2022-05-09 NOTE — Discharge Instructions (Addendum)
Continue your antiemetics at home.  Try to rest, push fluids. Follow-up with Dr. Lorenso Courier. Return to the ED for new or worsening symptoms.

## 2022-05-10 ENCOUNTER — Encounter: Payer: Self-pay | Admitting: Hematology and Oncology

## 2022-05-10 ENCOUNTER — Other Ambulatory Visit: Payer: Self-pay

## 2022-05-10 DIAGNOSIS — R112 Nausea with vomiting, unspecified: Secondary | ICD-10-CM | POA: Diagnosis not present

## 2022-05-10 DIAGNOSIS — T451X5A Adverse effect of antineoplastic and immunosuppressive drugs, initial encounter: Secondary | ICD-10-CM | POA: Diagnosis not present

## 2022-05-10 LAB — CBC
HCT: 31.7 % — ABNORMAL LOW (ref 36.0–46.0)
Hemoglobin: 10.2 g/dL — ABNORMAL LOW (ref 12.0–15.0)
MCH: 27.9 pg (ref 26.0–34.0)
MCHC: 32.2 g/dL (ref 30.0–36.0)
MCV: 86.6 fL (ref 80.0–100.0)
Platelets: 53 10*3/uL — ABNORMAL LOW (ref 150–400)
RBC: 3.66 MIL/uL — ABNORMAL LOW (ref 3.87–5.11)
RDW: 14.5 % (ref 11.5–15.5)
WBC: 4.8 10*3/uL (ref 4.0–10.5)
nRBC: 0 % (ref 0.0–0.2)

## 2022-05-10 LAB — BASIC METABOLIC PANEL
Anion gap: 7 (ref 5–15)
BUN: 13 mg/dL (ref 6–20)
CO2: 23 mmol/L (ref 22–32)
Calcium: 8.6 mg/dL — ABNORMAL LOW (ref 8.9–10.3)
Chloride: 108 mmol/L (ref 98–111)
Creatinine, Ser: 0.68 mg/dL (ref 0.44–1.00)
GFR, Estimated: >60 mL/min (ref >60)
Glucose, Bld: 92 mg/dL (ref 70–99)
Potassium: 3.9 mmol/L (ref 3.5–5.1)
Sodium: 138 mmol/L (ref 135–145)

## 2022-05-10 MED ORDER — CHLORHEXIDINE GLUCONATE CLOTH 2 % EX PADS: 6.0000 | MEDICATED_PAD | Freq: Every day | CUTANEOUS | Status: DC

## 2022-05-10 MED ORDER — HEPARIN SOD (PORK) LOCK FLUSH 100 UNIT/ML IV SOLN
500.0000 [IU] | INTRAVENOUS | Status: DC | PRN
  Filled 2022-05-10: qty 5

## 2022-05-10 NOTE — TOC Progression Note (Signed)
Transition of Care [facility]) - Progression Note    Patient Details  Name: Roniqua Marruffo MRN: [id number] Date of Birth: 12-Aug-1965  Transition of Care [facility]) CM/SW Contact  Purcell Mouton, RN Phone Number: 05/10/2022, 11:29 AM  Clinical Narrative:       Transition of Care (TOC) Screening Note   Patient Details  Name: Teri Kilman Date of Birth: 09/28/1965   Transition of Care [facility]) CM/SW Contact:    Purcell Mouton, RN Phone Number: 05/10/2022, 11:29 AM    Transition of Care Department (TOC) has reviewed patient and no TOC needs have been identified at this time. We will continue to monitor patient advancement through interdisciplinary progression rounds. If new patient transition needs arise, please place a TOC consult.        Expected Discharge Plan and Services                                               Social Determinants of Health (SDOH) Interventions SDOH Screenings   Food Insecurity: No Food Insecurity (03/14/2022)  Housing: Low Risk  (03/14/2022)  Transportation Needs: No Transportation Needs (03/14/2022)  Utilities: Not At Risk (03/14/2022)  Depression (PHQ2-9): Low Risk  (03/14/2022)  Tobacco Use: Low Risk  (05/09/2022)    Readmission Risk Interventions     No data to display

## 2022-05-10 NOTE — Progress Notes (Signed)
Pt discharged home with spouse in stable condition. Discharge instructions given. Pt verbalized understanding. No immediate questions or concerns. 

## 2022-05-10 NOTE — Discharge Summary (Signed)
Physician Discharge Summary   Kaitlin Ferrell [id number] DOB: May 19, 1965 DOA: 05/09/2022  PCP: Robyne Peers, MD  Admit date: 05/09/2022 Discharge date: 05/10/2022   Admitted From: Home  Disposition:  Home Discharging physician: Dwyane Dee, MD Barriers to discharge: none  Recommendations for Outpatient Follow-up:  Continue following with oncology    Discharge Condition: stable CODE STATUS: Full Diet recommendation:  Diet Orders (From admission, onward)     Start     Ordered   05/10/22 0000  Diet general        05/10/22 1211   05/09/22 1041  Diet clear liquid Room service appropriate? Yes; Fluid consistency: Thin  Diet effective now       Question Answer Comment  Room service appropriate? Yes   Fluid consistency: Thin      05/09/22 1041            Hospital Course: Kaitlin Ferrell is a 57 y.o. female with medical history significant for anxiety, hypertension, migraines and recent diagnosis in January 2024 of Stage IV metastatic pancreatic cancer.  She is being treated with home chemotherapy infusions and developed N/V/D after recent treatment. Due to inability to tolerate adequate nutrition, she presented to the ER for further evaluation and was started on fluids, antiemetics and supportive care. The morning following admission, she had improvement in her symptoms and was able to tolerate some food and felt comfortable with discharging home.  The patient's chronic medical conditions were treated accordingly per the patient's home medication regimen except as noted.  On day of discharge, patient was felt deemed stable for discharge. Patient/family member advised to call PCP or come back to ER if needed.   Principal Diagnosis: Chemotherapy-induced nausea and vomiting  Discharge Diagnoses: Active Hospital Problems   Diagnosis Date Noted   Chemotherapy-induced nausea and vomiting 05/09/2022   Intractable nausea and vomiting 05/09/2022    Hypokalemia 05/09/2022   Pancreatic carcinoma metastatic to liver 05/09/2022   Intractable vomiting with nausea 05/09/2022   Port-A-Cath in place 04/01/2022   Essential hypertension 10/17/2018   Gastroesophageal reflux disease without esophagitis 10/17/2018    Resolved Hospital Problems  No resolved problems to display.     Discharge Instructions     Diet general   Complete by: As directed    Increase activity slowly   Complete by: As directed       Allergies as of 05/10/2022       Reactions   Compazine [prochlorperazine Edisylate]    Very agitated   Propofol Other (See Comments)   Headache   Oxycodone-acetaminophen    Prochlorperazine    Labetalol Hives, Itching, Swelling, Rash      Allergies as of 05/10/2022       Reactions   Compazine [prochlorperazine Edisylate]    Very agitated   Propofol Other (See Comments)   Headache   Oxycodone-acetaminophen    Prochlorperazine    Labetalol Hives, Itching, Swelling, Rash        Medication List     TAKE these medications    amLODipine 10 MG tablet Commonly known as: NORVASC Take 1 tablet by mouth daily.   Cholecalciferol 1.25 MG (50000 UT) capsule Take 50,000 Units by mouth once a week.   citalopram 10 MG tablet Commonly known as: CELEXA Take by mouth.   diphenoxylate-atropine 2.5-0.025 MG tablet Commonly known as: LOMOTIL Take 1 tablet by mouth 4 (four) times daily as needed for diarrhea or loose stools.   eletriptan 40 MG tablet Commonly known as:  Relpax Take 1 tablet (40 mg total) by mouth as needed for migraine or headache. May repeat in 2 hours if needed.   hydrochlorothiazide 12.5 MG capsule Commonly known as: MICROZIDE Take 12.5 mg by mouth daily as needed (swelling).   HYDROcodone-acetaminophen 10-325 MG tablet Commonly known as: Norco Take 1 tablet by mouth every 6 (six) hours as needed. What changed: reasons to take this   hydrocodone-ibuprofen 5-200 MG tablet Commonly known as:  VICOPROFEN Take 1-2 tablets by mouth every 6 (six) hours as needed for pain.   lidocaine-prilocaine cream Commonly known as: EMLA Apply 1 Application topically as needed.   LORazepam 0.5 MG tablet Commonly known as: ATIVAN Take 0.5 mg by mouth as needed for anxiety.   magic mouthwash (multi-ingredient) oral suspension Swish and swallow 5 mLs by mouth 4 times daily as needed for throat discomfort   magnesium oxide 400 (241.3 Mg) MG tablet Commonly known as: MAG-OX TAKE 1 TABLET BY MOUTH TWICE A DAY   morphine 30 MG 12 hr tablet Commonly known as: MS CONTIN Take 1 tablet (30 mg total) by mouth every 12 (twelve) hours.   OLANZapine 10 MG tablet Commonly known as: ZYPREXA TAKE 1 TABLET BY MOUTH EVERYDAY AT BEDTIME What changed: See the new instructions.   ondansetron 8 MG tablet Commonly known as: ZOFRAN Take 1 tablet (8 mg total) by mouth every 8 (eight) hours as needed for nausea or vomiting.   promethazine 25 MG tablet Commonly known as: PHENERGAN Take 1 tablet (25 mg total) by mouth every 6 (six) hours as needed for nausea or vomiting.   scopolamine 1 MG/3DAYS Commonly known as: TRANSDERM-SCOP Place 1 patch (1.5 mg total) onto the skin every 3 (three) days.            Follow-up Information     Schedule an appointment as soon as possible for a visit  with Orson Slick, MD.   Specialty: Hematology and Oncology Contact information: [address] [place] [address] [phone]                Allergies  Allergen Reactions   Compazine [Prochlorperazine Edisylate]     Very agitated   Propofol Other (See Comments)    Headache   Oxycodone-Acetaminophen    Prochlorperazine    Labetalol Hives, Itching, Swelling and Rash    Consultations:   Procedures:   Discharge Exam: BP (!) 99/58 (BP Location: Left Arm)   Pulse 82   Temp 98 F (36.7 C) (Oral)   Resp 14   Ht 5\' 2"  (1.575 m)   Wt 67.7 kg   LMP 06/26/2013 Comment: low dose BCP   SpO2 100%   BMI 27.29 kg/m  Physical Exam Constitutional:      Appearance: Normal appearance.  HENT:     Head: Normocephalic and atraumatic.     Mouth/Throat:     Mouth: Mucous membranes are moist.  Eyes:     Extraocular Movements: Extraocular movements intact.  Cardiovascular:     Rate and Rhythm: Regular rhythm.  Pulmonary:     Effort: Pulmonary effort is normal.     Breath sounds: Normal breath sounds.  Abdominal:     General: Bowel sounds are normal. There is no distension.     Palpations: Abdomen is soft.     Tenderness: There is no abdominal tenderness.  Musculoskeletal:        General: Normal range of motion.     Cervical back: Normal range of motion and  neck supple.  Skin:    General: Skin is warm.  Neurological:     General: No focal deficit present.     Mental Status: She is alert and oriented to person, place, and time.  Psychiatric:        Mood and Affect: Mood normal.      The results of significant diagnostics from this hospitalization (including imaging, microbiology, ancillary and laboratory) are listed below for reference.   Microbiology: No results found for this or any previous visit (from the past 240 hour(s)).   Labs: BNP (last 3 results) No results for input(s): "BNP" in the last 8760 hours. Basic Metabolic Panel: Recent Labs  Lab 05/04/22 0926 05/09/22 0448 05/09/22 1135 05/09/22 2233 05/10/22 0601  NA 142 134*  --   --  138  K 3.0* 2.9*  --  4.0 3.9  CL 106 95*  --   --  108  CO2 28 24  --   --  23  GLUCOSE 112* 130*  --   --  92  BUN 7 20  --   --  13  CREATININE 0.69 0.78 0.61  --  0.68  CALCIUM 9.2 9.9  --   --  8.6*  MG  --   --  2.2  --   --    Liver Function Tests: Recent Labs  Lab 05/04/22 0926 05/09/22 0448  AST 83* 50*  ALT 76* 63*  ALKPHOS 194* 182*  BILITOT 0.3 0.9  PROT 7.1 8.7*  ALBUMIN 3.9 4.7   Recent Labs  Lab 05/09/22 0448  LIPASE 70*   No results for input(s): "AMMONIA" in the last 168  hours. CBC: Recent Labs  Lab 05/04/22 0926 05/09/22 0448 05/09/22 1135 05/10/22 0601  WBC 9.1 9.1 7.6 4.8  NEUTROABS 5.9 7.3  --   --   HGB 10.5* 13.6 11.6* 10.2*  HCT 32.4* 41.7 35.7* 31.7*  MCV 85.7 83.9 84.2 86.6  PLT 124* 101* 83* 53*   Cardiac Enzymes: No results for input(s): "CKTOTAL", "CKMB", "CKMBINDEX", "TROPONINI" in the last 168 hours. BNP: Invalid input(s): "POCBNP" CBG: No results for input(s): "GLUCAP" in the last 168 hours. D-Dimer No results for input(s): "DDIMER" in the last 72 hours. Hgb A1c No results for input(s): "HGBA1C" in the last 72 hours. Lipid Profile No results for input(s): "CHOL", "HDL", "LDLCALC", "TRIG", "CHOLHDL", "LDLDIRECT" in the last 72 hours. Thyroid function studies No results for input(s): "TSH", "T4TOTAL", "T3FREE", "THYROIDAB" in the last 72 hours.  Invalid input(s): "FREET3" Anemia work up No results for input(s): "VITAMINB12", "FOLATE", "FERRITIN", "TIBC", "IRON", "RETICCTPCT" in the last 72 hours. Urinalysis    Component Value Date/Time   COLORURINE YELLOW 04/09/2022 1750   APPEARANCEUR CLEAR 04/09/2022 1750   LABSPEC 1.025 04/09/2022 1750   PHURINE 5.0 04/09/2022 1750   GLUCOSEU NEGATIVE 04/09/2022 1750   HGBUR NEGATIVE 04/09/2022 1750   BILIRUBINUR NEGATIVE 04/09/2022 1750   KETONESUR 80 (A) 04/09/2022 1750   PROTEINUR NEGATIVE 04/09/2022 1750   NITRITE NEGATIVE 04/09/2022 1750   LEUKOCYTESUR NEGATIVE 04/09/2022 1750   Sepsis Labs Recent Labs  Lab 05/04/22 0926 05/09/22 0448 05/09/22 1135 05/10/22 0601  WBC 9.1 9.1 7.6 4.8   Microbiology No results found for this or any previous visit (from the past 240 hour(s)).  Procedures/Studies: DG Chest Port 1 View  Result Date: 05/06/2022 CLINICAL DATA:  Leaking Port-A-Cath EXAM: PORTABLE CHEST 1 VIEW COMPARISON:  04/14/2022, 05/02/2022 FINDINGS: New left-sided chest wall port is noted with catheter tip in  the right atrium. Port needle appears well placed on this  single view. Cardiac shadow is within normal limits. Lungs are clear bilaterally. No bony abnormality is noted. IMPRESSION: No acute abnormality is noted.  Port needle appears well placed. Electronically Signed   By: Inez Catalina M.D.   On: 05/06/2022 00:33   IR Removal Tun Cv Cath W/O FL  Result Date: 05/03/2022 INDICATION: Metastatic pancreatic cancer, post placement image guided Port a catheter on 03/22/2022. Unfortunately, port a catheter reservoir was noted to be tilted resulting in an inability to allow for successful port reservoir access. As such, patient underwent definitive Port revision and tacking on 04/19/2022, however despite this intervention the infusion center has still not been able to successfully accessed the patient's port a catheter. As such, patient returns today to the interventional radiology department for definitive [facility] a catheter replacement. EXAM: 1. IMPLANTED PORT A CATH PLACEMENT WITH ULTRASOUND AND FLUOROSCOPIC GUIDANCE 2. PORT A CATHETER REMOVAL COMPARISON:  Port a catheter revision-04/19/2022 Image guided PICC line placement-04/20/2022 Port a catheter placement-03/22/2022 MEDICATIONS: Ancef 2 g IV; the antibiotics were administered within an appropriate time frame prior to the initiation of the procedure. ANESTHESIA/SEDATION: Moderate (conscious) sedation was employed during this procedure as administered by the Interventional Radiology RN. A total of Versed 3 mg and Fentanyl 150 mcg was administered intravenously. Moderate Sedation Time: 51 minutes. The patient's level of consciousness and vital signs were monitored continuously by radiology nursing throughout the procedure under my direct supervision. CONTRAST:  None FLUOROSCOPY TIME:  40 seconds (4 mGy) COMPLICATIONS: None immediate. PROCEDURE: The procedure, risks, benefits, and alternatives were explained to the patient. Questions regarding the procedure were encouraged and answered. The patient understands and consents to  the procedure. Given the presence of the malpositioned right internal jugular approach port a catheter, the decision was made to proceed with definitive left internal jugular approach port a catheter placement. As such, the left neck and chest were prepped with chlorhexidine in a sterile fashion, and a sterile drape was applied covering the operative field. Maximum barrier sterile technique with sterile gowns and gloves were used for the procedure. A timeout was performed prior to the initiation of the procedure. Local anesthesia was provided with 1% lidocaine with epinephrine. After creating a small venotomy incision, a micropuncture kit was utilized to access the internal jugular vein. Real-time ultrasound guidance was utilized for vascular access including the acquisition of a permanent ultrasound image documenting patency of the accessed vessel. The microwire was utilized to measure appropriate catheter length. A subcutaneous port pocket was then created along the upper chest wall utilizing a combination of sharp and blunt dissection. The pocket was irrigated with sterile saline. A single lumen ISP sized power injectable port was chosen for placement. The 8 Fr catheter was tunneled from the port pocket site to the venotomy incision. The port was placed in the pocket. The external catheter was trimmed to appropriate length. At the venotomy, an 8 Fr peel-away sheath was placed over a guidewire under fluoroscopic guidance. The catheter was then placed through the sheath and the sheath was removed. Final catheter positioning was confirmed and documented with a fluoroscopic spot radiograph. The port was accessed with a Huber needle, aspirated and flushed with heparinized saline. The venotomy site was closed with an interrupted 4-0 Vicryl suture. The port pocket incision was closed with interrupted 2-0 Vicryl suture. The skin was opposed with a running subcuticular 4-0 Vicryl suture. Dermabond and Steri-strips were  applied to both incisions.  Dressings were applied. Attention was now paid towards removal of the malpositioned right-sided port a catheter. The skin overlying the operative site was prepped and draped in usual sterile fashion. After the adjacent subcutaneous tissues were anesthetized with 1% lidocaine with epinephrine, the port a Catheter was removed intact using a combination of sharp and blunt dissection. Port incision was closed with an interrupted 2-0 Vicryl suture. Dermabond and Steri-Strips were applied to the incision. Dressings were applied. The patient tolerated the above procedures well without immediate postprocedural complication. FINDINGS: After catheter placement, the left internal jugular approach port a catheter tip lies within the superior cavoatrial junction. The catheter aspirates and flushes normally and is ready for immediate use. After definitive removal of the right internal jugular approach port a catheter, the left internal jugular approach port a catheter as well as the right upper extremity approach PICC line are unchanged in position. IMPRESSION: 1. Successful placement of a left internal jugular approach power injectable Port-A-Cath. The catheter is ready for immediate use. 2. Successful removal of right internal jugular approach port a catheter. PLAN: - The patient is scheduled for chemotherapy this coming Wednesday (05/04/2022). - If the left internal jugular approach port a catheter is successfully accessed and utilized for treatment, the patient may return to the interventional radiology department for removal of the right upper extremity approach PICC line as indicated. Electronically Signed   By: Sandi Mariscal M.D.   On: 05/03/2022 12:33   IR IMAGING GUIDED PORT INSERTION  Result Date: 05/03/2022 INDICATION: Metastatic pancreatic cancer, post placement image guided Port a catheter on 03/22/2022. Unfortunately, port a catheter reservoir was noted to be tilted resulting in an  inability to allow for successful port reservoir access. As such, patient underwent definitive Port revision and tacking on 04/19/2022, however despite this intervention the infusion center has still not been able to successfully accessed the patient's port a catheter. As such, patient returns today to the interventional radiology department for definitive [facility] a catheter replacement. EXAM: 1. IMPLANTED PORT A CATH PLACEMENT WITH ULTRASOUND AND FLUOROSCOPIC GUIDANCE 2. PORT A CATHETER REMOVAL COMPARISON:  Port a catheter revision-04/19/2022 Image guided PICC line placement-04/20/2022 Port a catheter placement-03/22/2022 MEDICATIONS: Ancef 2 g IV; the antibiotics were administered within an appropriate time frame prior to the initiation of the procedure. ANESTHESIA/SEDATION: Moderate (conscious) sedation was employed during this procedure as administered by the Interventional Radiology RN. A total of Versed 3 mg and Fentanyl 150 mcg was administered intravenously. Moderate Sedation Time: 51 minutes. The patient's level of consciousness and vital signs were monitored continuously by radiology nursing throughout the procedure under my direct supervision. CONTRAST:  None FLUOROSCOPY TIME:  40 seconds (4 mGy) COMPLICATIONS: None immediate. PROCEDURE: The procedure, risks, benefits, and alternatives were explained to the patient. Questions regarding the procedure were encouraged and answered. The patient understands and consents to the procedure. Given the presence of the malpositioned right internal jugular approach port a catheter, the decision was made to proceed with definitive left internal jugular approach port a catheter placement. As such, the left neck and chest were prepped with chlorhexidine in a sterile fashion, and a sterile drape was applied covering the operative field. Maximum barrier sterile technique with sterile gowns and gloves were used for the procedure. A timeout was performed prior to the initiation  of the procedure. Local anesthesia was provided with 1% lidocaine with epinephrine. After creating a small venotomy incision, a micropuncture kit was utilized to access the internal jugular vein. Real-time ultrasound guidance  was utilized for vascular access including the acquisition of a permanent ultrasound image documenting patency of the accessed vessel. The microwire was utilized to measure appropriate catheter length. A subcutaneous port pocket was then created along the upper chest wall utilizing a combination of sharp and blunt dissection. The pocket was irrigated with sterile saline. A single lumen ISP sized power injectable port was chosen for placement. The 8 Fr catheter was tunneled from the port pocket site to the venotomy incision. The port was placed in the pocket. The external catheter was trimmed to appropriate length. At the venotomy, an 8 Fr peel-away sheath was placed over a guidewire under fluoroscopic guidance. The catheter was then placed through the sheath and the sheath was removed. Final catheter positioning was confirmed and documented with a fluoroscopic spot radiograph. The port was accessed with a Huber needle, aspirated and flushed with heparinized saline. The venotomy site was closed with an interrupted 4-0 Vicryl suture. The port pocket incision was closed with interrupted 2-0 Vicryl suture. The skin was opposed with a running subcuticular 4-0 Vicryl suture. Dermabond and Steri-strips were applied to both incisions. Dressings were applied. Attention was now paid towards removal of the malpositioned right-sided port a catheter. The skin overlying the operative site was prepped and draped in usual sterile fashion. After the adjacent subcutaneous tissues were anesthetized with 1% lidocaine with epinephrine, the port a Catheter was removed intact using a combination of sharp and blunt dissection. Port incision was closed with an interrupted 2-0 Vicryl suture. Dermabond and Steri-Strips  were applied to the incision. Dressings were applied. The patient tolerated the above procedures well without immediate postprocedural complication. FINDINGS: After catheter placement, the left internal jugular approach port a catheter tip lies within the superior cavoatrial junction. The catheter aspirates and flushes normally and is ready for immediate use. After definitive removal of the right internal jugular approach port a catheter, the left internal jugular approach port a catheter as well as the right upper extremity approach PICC line are unchanged in position. IMPRESSION: 1. Successful placement of a left internal jugular approach power injectable Port-A-Cath. The catheter is ready for immediate use. 2. Successful removal of right internal jugular approach port a catheter. PLAN: - The patient is scheduled for chemotherapy this coming Wednesday (05/04/2022). - If the left internal jugular approach port a catheter is successfully accessed and utilized for treatment, the patient may return to the interventional radiology department for removal of the right upper extremity approach PICC line as indicated. Electronically Signed   By: Sandi Mariscal M.D.   On: 05/03/2022 12:33   IR PICC PLACEMENT RIGHT >5 YRS INC IMG GUIDE  Result Date: 04/20/2022 INDICATION: History of metastatic pancreatic cancer on chemotherapy. Current port malfunction. Request PICC line placement for intravenous therapies. EXAM: ULTRASOUND AND FLUOROSCOPIC GUIDED RIGHT UPPER EXTREMITY PICC LINE INSERTION MEDICATIONS: 1% plain lidocaine, 1 mL CONTRAST:  None FLUOROSCOPY TIME:  Radiation exposure index: (2  MGy) COMPLICATIONS: None immediate. TECHNIQUE: The procedure, risks, benefits, and alternatives were explained to the patient and informed written consent was obtained. A timeout was performed prior to the initiation of the procedure. The right upper extremity was prepped with chlorhexidine in a sterile fashion, and a sterile drape was  applied covering the operative field. Maximum barrier sterile technique with sterile gowns and gloves were used for the procedure. A timeout was performed prior to the initiation of the procedure. Local anesthesia was provided with 1% lidocaine. Under direct ultrasound guidance, the basilic vein was  accessed with a micropuncture kit after the overlying soft tissues were anesthetized with 1% lidocaine. After the overlying soft tissues were anesthetized, a small venotomy incision was created and a micropuncture kit was utilized to access the right basilic vein. Real-time ultrasound guidance was utilized for vascular access including the acquisition of a permanent ultrasound image documenting patency of the accessed vessel. A guidewire was advanced to the level of the superior caval-atrial junction for measurement purposes and the PICC line was cut to length. A peel-away sheath was placed and a 35 cm, 5 [place], dual lumen was inserted to level of the superior caval-atrial junction. A post procedure spot fluoroscopic was obtained. The catheter easily aspirated and flushed and was secured in place. A dressing was placed. The patient tolerated the procedure well without immediate post procedural complication. FINDINGS: After catheter placement, the tip lies within the superior cavoatrial junction. The catheter aspirates and flushes normally and is ready for immediate use. IMPRESSION: Successful ultrasound and fluoroscopic guided placement of a right basilic vein approach, 35 cm, 5 French, dual lumen PICC with tip at the superior caval-atrial junction. The PICC line is ready for immediate use. Read by: Ascencion Dike PA-C Electronically Signed   By: Jerilynn Mages.  Shick M.D.   On: 04/20/2022 15:05   IR Port Repair Central Venous Access Device  Result Date: 04/19/2022 INDICATION: 57 year old female with question of port malfunction EXAM: IMAGE GUIDED PORT CATHETER REVISION MEDICATIONS: None. ANESTHESIA/SEDATION: Moderate  (conscious) sedation was employed during this procedure. A total of Versed 2.0 mg and Fentanyl 100 mcg was administered intravenously by the radiology nurse. Total intra-service moderate Sedation Time: 31 minutes. The patient's level of consciousness and vital signs were monitored continuously by radiology nursing throughout the procedure under my direct supervision. FLUOROSCOPY: Radiation Exposure Index (as provided by the fluoroscopic device): 0 mGy Kerma COMPLICATIONS: None PROCEDURE: The procedure, risks, benefits, and alternatives were explained to the patient. Questions regarding the procedure were encouraged and answered. The patient understands and consents to the procedure. The right neck and chest were prepped with chlorhexidine in a sterile fashion, and a sterile drape was applied covering the operative field. Maximum barrier sterile technique with sterile gowns and gloves were used for the procedure. A timeout was performed prior to the initiation of the procedure. Local anesthesia was provided with 1% lidocaine with epinephrine. Physical exam was performed, and initial image was stored. After initiation of moderate sedation, local anesthesia was administered at the right chest wall port catheter site. Incision was made along the prior scar after local anesthesia was applied. Blunt dissection was used to open the port pocket to observe the orientation of the port catheter. We observed that the prior retention suture was in place at the apex of the port and that the port was not inverted/rotated. There is a slight leftward orientation of the rubber septum. At this point using physical exam and manual palpation, a 1 inch Huber needle was used to access the port. Note that the 1 inch Huber needle is hub once the needle is in place in the port. Then, using a hemostat the hub of the port catheter was grasped and the port was rotated towards the patient's right side. Aspiration and flush was confirmed through  the 1 inch Huber needle. 500 units of Hep-Lock in 5 cc was then administered. At this time a 0 Prolene suture was used to anchor the right corner of the port catheter to the soft tissues in the port pocket, for a  more central orientation of the rubber septum. The original retention suture was left intact. The pocket was irrigated copiously with sterile saline. The port pocket incision was closed with interrupted 2-0 Vicryl suture and the skin was opposed with a running subcuticular 4-0 Vicryl suture. Dermabond was applied. Dressings were placed. The patient tolerated the procedure well without immediate post procedural complication. FINDINGS: Upon initial physical exam, the rubber septum is palpable under the skin surface, with the septum oriented slightly towards the patient's left shoulder. Upon opening the port catheter, the stay suture was confirmed to be intact, with the septum oriented slightly towards the patient's left shoulder. A more midline orientation was attempted with rotating the port in the pocket, with a second stay suture placed at the right apex. We confirmed access was capable through the skin service with a 1 inch Huber needle. The 1 inch Huber needle is hub at the skin surface. Perhaps further access may be easier with 1.25 inch or 1.5 inch Huber needle. Directing slightly towards the patient's right allows confident access. IMPRESSION: Status post image guided revision of right-sided IJ port catheter, with additional retention suture placed at the right apex, and function confirmed. Signed, Dulcy Fanny. Nadene Rubins, RPVI [facility] Radiology Electronically Signed   By: Corrie Mckusick D.O.   On: 04/19/2022 13:27   DG Chest 2 View  Result Date: 04/16/2022 CLINICAL DATA:  Evaluate port placement EXAM: CHEST - 2 VIEW COMPARISON:  Port placement images [id number] FINDINGS: Right IJ approach single-lumen power injectable port catheter. The catheter tip  overlies the mid SVC. The port reservoir appears tilted to the side. Cardiac and mediastinal contours are normal. Atherosclerotic calcifications present in the transverse aorta. The lungs are clear. No acute osseous abnormality. IMPRESSION: 1. Right IJ approach single-lumen power injectable port catheter. The catheter tip overlies the mid SVC. 2. The port reservoir appears slightly tilted with the access site facing somewhat anterolaterally rather than directly anterior. 3. No acute cardiopulmonary process. 4. Atherosclerotic calcifications in the aorta. Electronically Signed   By: Jacqulynn Cadet M.D.   On: 04/16/2022 17:21   IR Chest Fluoro  Result Date: 04/14/2022 INDICATION: Inability to access port catheter, unable to get infusion today. EXAM: Unsuccessful attempt at port catheter access under fluoroscopy MEDICATIONS: None. ANESTHESIA/SEDATION: None. FLUOROSCOPY: Radiation Exposure Index (as provided by the fluoroscopic device): 3.0 mGy Kerma COMPLICATIONS: None immediate. PROCEDURE: Informed written consent was obtained from the patient after a thorough discussion of the procedural risks, benefits and alternatives. All questions were addressed. Maximal Sterile Barrier Technique was utilized including caps, mask, sterile gowns, sterile gloves, sterile drape, hand hygiene and skin antiseptic. A timeout was performed prior to the initiation of the procedure. Under sterile conditions, fluoroscopic needle access was attempted of the right IJ power port catheter. Despite manual manipulation and fluoroscopic guidance, needle access within the port catheter cannot be obtained. Needle passes made unsuccessfully. By fluoroscopy, the port catheter position has changed compared to the original placement. IMPRESSION: 1. Unsuccessful fluoroscopic needle access of the right IJ power port catheter despite manual manipulation and fluoroscopic guidance. 2. Unable to access the port catheter under fluoroscopy. 3. Port  catheter revision will be scheduled as an outpatient electively. Electronically Signed   By: Jerilynn Mages.  Shick M.D.   On: 04/14/2022 14:05     Time coordinating discharge: Over 30 minutes    Dwyane Dee, MD  [facility] 05/10/2022, 3:11 PM

## 2022-05-10 NOTE — Hospital Course (Signed)
Kaitlin Ferrell is a 57 y.o. female with medical history significant for anxiety, hypertension, migraines and recent diagnosis in January 2024 of Stage IV metastatic pancreatic cancer.  She is being treated with home chemotherapy infusions and developed N/V/D after recent treatment. Due to inability to tolerate adequate nutrition, she presented to the ER for further evaluation and was started on fluids, antiemetics and supportive care. The morning following admission, she had improvement in her symptoms and was able to tolerate some food and felt comfortable with discharging home.

## 2022-05-12 ENCOUNTER — Other Ambulatory Visit: Payer: Self-pay | Admitting: *Deleted

## 2022-05-12 ENCOUNTER — Encounter: Payer: Self-pay | Admitting: Physician Assistant

## 2022-05-12 ENCOUNTER — Other Ambulatory Visit: Payer: Self-pay | Admitting: Hematology and Oncology

## 2022-05-12 ENCOUNTER — Encounter: Payer: Self-pay | Admitting: Hematology and Oncology

## 2022-05-12 DIAGNOSIS — C259 Malignant neoplasm of pancreas, unspecified: Secondary | ICD-10-CM

## 2022-05-12 NOTE — Telephone Encounter (Signed)
LM that our Symptom Mgmt Clinic here at [facility] can give her IVF at 10:00 am Friday. Please call to confirm.

## 2022-05-12 NOTE — Telephone Encounter (Signed)
Kaitlin Ferrell called back and said she will be here at 10:00 tomorrow for IVF

## 2022-05-12 NOTE — Telephone Encounter (Signed)
LM for [name] to call Dr Libby Maw nurse. We are trying to arrange IVF tomorrow. Dr Lorenso Courier will discuss making changes to treatment with her as she is having such a hard time.

## 2022-05-13 ENCOUNTER — Telehealth: Payer: Self-pay | Admitting: Hematology and Oncology

## 2022-05-13 ENCOUNTER — Other Ambulatory Visit: Payer: Self-pay

## 2022-05-13 ENCOUNTER — Inpatient Hospital Stay: Payer: BC Managed Care – PPO | Attending: Physician Assistant

## 2022-05-13 VITALS — BP 134/95 | HR 114 | Temp 98.3°F | Resp 16 | Ht 62.0 in | Wt 135.6 lb

## 2022-05-13 DIAGNOSIS — C250 Malignant neoplasm of head of pancreas: Secondary | ICD-10-CM | POA: Insufficient documentation

## 2022-05-13 DIAGNOSIS — Z95828 Presence of other vascular implants and grafts: Secondary | ICD-10-CM

## 2022-05-13 DIAGNOSIS — R112 Nausea with vomiting, unspecified: Secondary | ICD-10-CM | POA: Insufficient documentation

## 2022-05-13 DIAGNOSIS — E876 Hypokalemia: Secondary | ICD-10-CM | POA: Diagnosis not present

## 2022-05-13 DIAGNOSIS — Z803 Family history of malignant neoplasm of breast: Secondary | ICD-10-CM | POA: Insufficient documentation

## 2022-05-13 DIAGNOSIS — C787 Secondary malignant neoplasm of liver and intrahepatic bile duct: Secondary | ICD-10-CM | POA: Insufficient documentation

## 2022-05-13 DIAGNOSIS — R197 Diarrhea, unspecified: Secondary | ICD-10-CM | POA: Diagnosis not present

## 2022-05-13 DIAGNOSIS — R630 Anorexia: Secondary | ICD-10-CM | POA: Insufficient documentation

## 2022-05-13 DIAGNOSIS — R110 Nausea: Secondary | ICD-10-CM | POA: Diagnosis not present

## 2022-05-13 DIAGNOSIS — C259 Malignant neoplasm of pancreas, unspecified: Secondary | ICD-10-CM

## 2022-05-13 MED ORDER — SODIUM CHLORIDE 0.9 % IV SOLN
12.5000 mg | Freq: Once | INTRAVENOUS | Status: DC
  Filled 2022-05-13: qty 0.5

## 2022-05-13 MED ORDER — FAMOTIDINE IN NACL 20-0.9 MG/50ML-% IV SOLN
20.0000 mg | Freq: Once | INTRAVENOUS | Status: AC
  Administered 2022-05-13: 20 mg via INTRAVENOUS
  Filled 2022-05-13: qty 50

## 2022-05-13 MED ORDER — SODIUM CHLORIDE 0.9 % IV SOLN: Freq: Once | INTRAVENOUS | Status: AC

## 2022-05-13 MED ORDER — SODIUM CHLORIDE 0.9% FLUSH
10.0000 mL | Freq: Once | INTRAVENOUS | Status: AC
  Administered 2022-05-13: 10 mL

## 2022-05-13 MED ORDER — FAMOTIDINE IN NACL 20-0.9 MG/50ML-% IV SOLN: 20.0000 mg | Freq: Once | INTRAVENOUS | Status: DC

## 2022-05-13 MED ORDER — LORAZEPAM 2 MG/ML IJ SOLN
1.0000 mg | Freq: Once | INTRAMUSCULAR | Status: AC
  Administered 2022-05-13: 1 mg via INTRAVENOUS
  Filled 2022-05-13: qty 1

## 2022-05-13 MED ORDER — SODIUM CHLORIDE 0.9 % IV SOLN
12.5000 mg | Freq: Once | INTRAVENOUS | Status: AC
  Administered 2022-05-13: 12.5 mg via INTRAVENOUS
  Filled 2022-05-13: qty 0.5

## 2022-05-13 MED ORDER — HEPARIN SOD (PORK) LOCK FLUSH 100 UNIT/ML IV SOLN
500.0000 [IU] | Freq: Once | INTRAVENOUS | Status: AC
  Administered 2022-05-13: 500 [IU]

## 2022-05-13 NOTE — Progress Notes (Signed)
Patient requesting to speak to nutritionist regarding "general questions". Nutrition referral placed and Kaitlin Ferrell was contacted by this RN. Upon discharge, patient reports that she feels much better after antiemetics and IV fluids. Heart rate remains elevated at 114. Dr. Leonides Schanz made aware and states patient is "OK to be discharged". Patient denies pain or discomfort at time of discharge. [facility] appointment made for 4/9 for IV fluids/antiemetics and possible nutrition appointment. Patient verbalized understanding of date and time.

## 2022-05-13 NOTE — Patient Instructions (Signed)

## 2022-05-13 NOTE — Telephone Encounter (Signed)
Reached out to patient to schedule per 4/5 IB, left voicemail. 

## 2022-05-17 ENCOUNTER — Inpatient Hospital Stay: Payer: BC Managed Care – PPO | Admitting: Dietician

## 2022-05-17 ENCOUNTER — Inpatient Hospital Stay (HOSPITAL_BASED_OUTPATIENT_CLINIC_OR_DEPARTMENT_OTHER): Payer: BC Managed Care – PPO | Admitting: Physician Assistant

## 2022-05-17 ENCOUNTER — Inpatient Hospital Stay: Payer: BC Managed Care – PPO

## 2022-05-17 VITALS — BP 113/68 | HR 96 | Temp 97.7°F | Resp 16 | Wt 133.5 lb

## 2022-05-17 DIAGNOSIS — R112 Nausea with vomiting, unspecified: Secondary | ICD-10-CM

## 2022-05-17 DIAGNOSIS — C250 Malignant neoplasm of head of pancreas: Secondary | ICD-10-CM | POA: Diagnosis not present

## 2022-05-17 DIAGNOSIS — E876 Hypokalemia: Secondary | ICD-10-CM | POA: Diagnosis not present

## 2022-05-17 DIAGNOSIS — C259 Malignant neoplasm of pancreas, unspecified: Secondary | ICD-10-CM

## 2022-05-17 DIAGNOSIS — C787 Secondary malignant neoplasm of liver and intrahepatic bile duct: Secondary | ICD-10-CM | POA: Diagnosis not present

## 2022-05-17 DIAGNOSIS — R110 Nausea: Secondary | ICD-10-CM | POA: Diagnosis not present

## 2022-05-17 DIAGNOSIS — R630 Anorexia: Secondary | ICD-10-CM | POA: Diagnosis not present

## 2022-05-17 DIAGNOSIS — R197 Diarrhea, unspecified: Secondary | ICD-10-CM | POA: Diagnosis not present

## 2022-05-17 DIAGNOSIS — Z803 Family history of malignant neoplasm of breast: Secondary | ICD-10-CM | POA: Diagnosis not present

## 2022-05-17 DIAGNOSIS — Z95828 Presence of other vascular implants and grafts: Secondary | ICD-10-CM

## 2022-05-17 MED ORDER — MORPHINE SULFATE (PF) 2 MG/ML IV SOLN
2.0000 mg | Freq: Once | INTRAVENOUS | Status: AC
  Administered 2022-05-17: 2 mg via INTRAVENOUS
  Filled 2022-05-17: qty 1

## 2022-05-17 MED ORDER — SODIUM CHLORIDE 0.9% FLUSH
10.0000 mL | Freq: Once | INTRAVENOUS | Status: AC
  Administered 2022-05-17: 10 mL

## 2022-05-17 MED ORDER — ONDANSETRON 8 MG PO TBDP: 8.0000 mg | ORAL_TABLET | Freq: Three times a day (TID) | ORAL | 0 refills | Status: DC | PRN

## 2022-05-17 MED ORDER — ONDANSETRON HCL 4 MG/2ML IJ SOLN
8.0000 mg | Freq: Once | INTRAMUSCULAR | Status: AC
  Administered 2022-05-17: 8 mg via INTRAVENOUS
  Filled 2022-05-17: qty 4

## 2022-05-17 MED ORDER — MORPHINE SULFATE (PF) 2 MG/ML IV SOLN: 1.0000 mg | Freq: Once | INTRAVENOUS | Status: DC

## 2022-05-17 MED ORDER — SODIUM CHLORIDE 0.9 % IV SOLN
12.5000 mg | Freq: Once | INTRAVENOUS | Status: DC
  Filled 2022-05-17: qty 0.5

## 2022-05-17 MED ORDER — HEPARIN SOD (PORK) LOCK FLUSH 100 UNIT/ML IV SOLN
500.0000 [IU] | Freq: Once | INTRAVENOUS | Status: AC
  Administered 2022-05-17: 500 [IU]

## 2022-05-17 MED ORDER — SODIUM CHLORIDE 0.9 % IV SOLN: Freq: Once | INTRAVENOUS | Status: AC

## 2022-05-17 MED ORDER — LORAZEPAM 2 MG/ML IJ SOLN
0.5000 mg | Freq: Once | INTRAMUSCULAR | Status: AC
  Administered 2022-05-17: 0.5 mg via INTRAVENOUS
  Filled 2022-05-17: qty 1

## 2022-05-17 NOTE — Progress Notes (Signed)
Symptom Management Consult Note [facility]    Patient Care Team: Angelica Chessman, MD as PCP - General (Family Medicine) Reece Packer, MD as Consulting Physician (Medical Oncology)    Name / MRN / DOB: Kaitlin Ferrell  [id number]  1965/10/09   Date of visit: 05/17/2022   Chief Complaint/Reason for visit: nausea   Current Therapy: mFOLFIRINOX   Last treatment:  Day 2   Cycle 3 on 05/06/22   ASSESSMENT & PLAN: Patient is a 57 y.o. female  with oncologic history of stage IV metastatic pancreatic cancer followed by Dr. Leonides Schanz.  I have viewed most recent oncology note and lab work.    #stage IV metastatic pancreatic cancer  - Next appointment with oncologist is 05/19/22   # Uncontrolled nausea -Patient without symptom relief on home Zofran.  Discussed nausea interventions at length.  Patient agreeable to try Zofran ODT.  Prescription sent to the pharmacy for her. -Patient given a liter of IV fluids in clinic for hydration.  Also given Ativan for nausea and morphine for pain as that has helped in the past. -Patient does not wish to have labs drawn today, only wanting to try symptomatic treatment. - Offered patient palliative care referral for ongoing symptom management and goals of care. She politely refused at this time. - On reassessment she is tolerating PO intake with sips of water. - Strict ED precautions discussed should symptoms worsen.       Heme/Onc History: Oncology History  Pancreatic adenocarcinoma  03/24/2022 Initial Diagnosis   Pancreatic adenocarcinoma (HCC)   03/30/2022 -  Chemotherapy   Patient is on Treatment Plan : PANCREAS Modified FOLFIRINOX q14d x 4 cycles      Genetic Testing   [facility] CancerNext-Expanded Panel+RNA was Negative. 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 Report date is 04/07/2022.  The CancerNext-Expanded gene panel offered by [facility] and includes  sequencing, rearrangement, and RNA analysis for the following 77 genes: AIP, ALK, APC, ATM, AXIN2, BAP1, BARD1, BLM, BMPR1A, BRCA1, BRCA2, BRIP1, CDC73, CDH1, CDK4, CDKN1B, CDKN2A, CHEK2, CTNNA1, DICER1, FANCC, FH, FLCN, GALNT12, KIF1B, LZTR1, MAX, MEN1, MET, MLH1, MSH2, MSH3, MSH6, MUTYH, NBN, NF1, NF2, NTHL1, PALB2, PHOX2B, PMS2, POT1, PRKAR1A, PTCH1, PTEN, RAD51C, RAD51D, RB1, RECQL, RET, SDHA, SDHAF2, SDHB, SDHC, SDHD, SMAD4, SMARCA4, SMARCB1, SMARCE1, STK11, SUFU, TMEM127, TP53, TSC1, TSC2, VHL and XRCC2 (sequencing and deletion/duplication); EGFR, EGLN1, HOXB13, KIT, MITF, PDGFRA, POLD1, and POLE (sequencing only); EPCAM and GREM1 (deletion/duplication only).         Interval history-: Kaitlin Ferrell is a 57 y.o. female with oncologic history as above presenting to [facility] today with chief complaint of nausea and vomiting.  Patient is accompanied by her spouse who provides additional history.  Patient states she does not want to go in full detail about how she is feeling.  She is agreeable to talk about her nausea as it is currently uncontrolled at home.  She tells me that yesterday she was able to drink approximately 20 ounces of fluid.  She is a hard time tolerating her Zofran pill as she vomits soon after following it.  Yesterday she had 2 episodes of nonbloody nonbilious emesis.  She is wearing her scopolamine patch.  She denies any associated abdominal pain.  She tells me her diarrhea has currently resolved.  No over-the-counter medications taken prior to arrival today.  Denies any fevers.      ROS  All other systems  are reviewed and are negative for acute change except as noted in the HPI.    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   treated with 4 cycles of actinomycin D from 12-02-11 thru 01-13-12   H/O molar pregnancy, antepartum    Hypertension    Migraine    Miscarriage      Past Surgical History:  Procedure Laterality Date   BREAST BIOPSY  2001   Left   BREAST LUMPECTOMY     DILATION AND CURETTAGE OF UTERUS  1992, 09/2011   DILATION AND EVACUATION  2011   INSERTION OF MESH N/A 09/03/2013   Procedure: INSERTION OF MESH;  Surgeon: Ardeth Sportsman, MD;  Location: [facility];  Service: General;  Laterality: N/A;   IR CHEST FLUORO  04/14/2022   IR IMAGING GUIDED PORT INSERTION  03/22/2022   IR IMAGING GUIDED PORT INSERTION  05/02/2022   IR PORT REPAIR CENTRAL VENOUS ACCESS DEVICE  04/19/2022   IR REMOVAL TUN CV CATH W/O FL  05/02/2022   IR US LIVER BIOPSY  03/22/2022   PORTACATH PLACEMENT  11/2011   PORTACATH PLACEMENT     REMOVAL  MARCH 2014   VENTRAL HERNIA REPAIR N/A 09/03/2013   Procedure: LAPAROSCOPIC VENTRAL WALL HERNIA REPAIR;  Surgeon: Ardeth Sportsman, MD;  Location: [facility];  Service: General;  Laterality: N/A;   WISDOM TOOTH EXTRACTION     in 11th grade    Social History   Socioeconomic History   Marital status: Married    Spouse name: [name]   Number of children: 2   Years of education: college   Highest education level: Not on file  Occupational History    Comment: C[employer]  Tobacco Use   Smoking status: Never   Smokeless tobacco: Never  Substance and Sexual Activity   Alcohol use: Yes    Alcohol/week: 1.0 standard drink of alcohol    Types: 1 Glasses of wine per week    Comment: occas   Drug use: No   Sexual activity: Not Currently    Birth control/protection: Pill  Other Topics Concern   Not on file  Social History Narrative   Patient is married [name]). Patient works part time at [employer].   Right handed.    Patient has her CMA.   Caffeine- None         Social Determinants of Health   Financial Resource Strain: Not on file  Food Insecurity: No Food Insecurity (05/10/2022)   Hunger Vital Sign    Worried About Running Out of Food in the Last Year: Never true    Ran Out of Food in the Last Year: Never true  Transportation Needs: No Transportation Needs (05/10/2022)   PRAPARE - Administrator, Civil Service (Medical): No    Lack of Transportation (Non-Medical): No  Physical Activity: Not on file  Stress: Not on file  Social Connections: Not on file  Intimate Partner Violence: Not At Risk (05/10/2022)   Humiliation, Afraid, Rape, and Kick questionnaire    Fear of  Current or Ex-Partner: No    Emotionally Abused: No    Physically Abused: No    Sexually Abused: No    Family History  Problem Relation Age of Onset   Breast cancer Mother        dx > 50   Heart attack Mother    Diabetes Father    Breast cancer Maternal Aunt        dx > 50   Breast cancer Maternal Aunt        dx > 50   Breast cancer Maternal Grandmother        ? < 50   Stroke Paternal Grandmother    Alcoholism Other      Current Outpatient Medications:    ondansetron (ZOFRAN-ODT) 8 MG disintegrating tablet, Take 1 tablet (8 mg total) by mouth every 8 (eight) hours as needed for nausea or vomiting., Disp: 20 tablet, Rfl: 0   amLODipine (NORVASC) 10 MG tablet, Take 1 tablet by mouth daily., Disp: , Rfl:    Cholecalciferol 1.25 MG (50000 UT) capsule, Take 50,000 Units by mouth once a week. (Patient not taking: Reported on 05/09/2022), Disp: , Rfl:    citalopram (CELEXA) 10 MG tablet, Take by mouth. (Patient not taking: Reported on 05/09/2022), Disp: , Rfl:    diphenoxylate-atropine (LOMOTIL) 2.5-0.025 MG tablet, Take 1 tablet by mouth 4 (four) times daily as needed for diarrhea or loose stools., Disp: 30 tablet, Rfl: 0   eletriptan (RELPAX) 40 MG tablet, Take 1 tablet (40 mg total) by mouth as needed for migraine or  headache. May repeat in 2 hours if needed., Disp: 15 tablet, Rfl: 6   hydrochlorothiazide (MICROZIDE) 12.5 MG capsule, Take 12.5 mg by mouth daily as needed (swelling)., Disp: , Rfl:    HYDROcodone-acetaminophen (NORCO) 10-325 MG tablet, Take 1 tablet by mouth every 6 (six) hours as needed. (Patient taking differently: Take 1 tablet by mouth every 6 (six) hours as needed for moderate pain or severe pain.), Disp: 120 tablet, Rfl: 0   hydrocodone-ibuprofen (VICOPROFEN) 5-200 MG tablet, Take 1-2 tablets by mouth every 6 (six) hours as needed for pain. (Patient not taking: Reported on 05/09/2022), Disp: 60 tablet, Rfl: 0   lidocaine-prilocaine (EMLA) cream, Apply 1 Application topically as needed. (Patient not taking: Reported on 05/09/2022), Disp: 30 g, Rfl: 0   LORazepam (ATIVAN) 0.5 MG tablet, Take 0.5 mg by mouth as needed for anxiety. (Patient not taking: Reported on 05/09/2022), Disp: , Rfl:    magic mouthwash (multi-ingredient) oral suspension, Swish and swallow 5 mLs by mouth 4 times daily as needed for throat discomfort, Disp: 400 mL, Rfl: 1   magnesium oxide (MAG-OX) 400 (241.3 MG) MG tablet, TAKE 1 TABLET BY MOUTH TWICE A DAY (Patient taking differently: Take 400 mg by mouth 2 (two) times daily.), Disp: 60 tablet, Rfl: 6   morphine (MS CONTIN) 30 MG 12 hr tablet, Take 1 tablet (30 mg total) by mouth every 12 (twelve) hours., Disp: 60 tablet, Rfl: 0   OLANZapine (ZYPREXA) 10 MG tablet, TAKE 1 TABLET BY MOUTH EVERYDAY AT BEDTIME (Patient taking differently: Take 10 mg by mouth at bedtime.), Disp: 90 tablet, Rfl: 1   ondansetron (ZOFRAN) 8 MG tablet, Take 1 tablet (8 mg total) by mouth every 8 (eight) hours as needed for nausea or vomiting., Disp: 90 tablet, Rfl: 0   promethazine (PHENERGAN) 25 MG tablet, Take 1 tablet (25 mg total) by mouth every 6 (six) hours as needed for nausea or vomiting., Disp: 30  tablet, Rfl: 0   scopolamine (TRANSDERM-SCOP) 1 MG/3DAYS, Place 1 patch (1.5 mg total) onto the skin  every 3 (three) days., Disp: 10 patch, Rfl: 12 No current facility-administered medications for this visit.  Facility-Administered Medications Ordered in Other Visits:    [START ON 05/20/2022] promethazine (PHENERGAN) 12.5 mg in sodium chloride 0.9 % 50 mL IVPB, 12.5 mg, Intravenous, Once, Thayil, Irene T, PA-C  PHYSICAL EXAM: ECOG FS:2 - Symptomatic, <50% confined to bed  T: 97.7  BP: 113/68 HR: 96  Resp: 16 SpO2:100% Physical Exam Vitals and nursing note reviewed.  Constitutional:      Appearance: She is well-developed. She is not ill-appearing or toxic-appearing.  HENT:     Head: Normocephalic.     Nose: Nose normal.     Mouth/Throat:     Mouth: Mucous membranes are dry.  Eyes:     Conjunctiva/sclera: Conjunctivae normal.  Neck:     Vascular: No JVD.  Cardiovascular:     Rate and Rhythm: Normal rate and regular rhythm.     Pulses: Normal pulses.     Heart sounds: Normal heart sounds.  Pulmonary:     Effort: Pulmonary effort is normal.     Breath sounds: Normal breath sounds.  Abdominal:     General: There is no distension.     Tenderness: There is no abdominal tenderness.  Musculoskeletal:     Cervical back: Normal range of motion.  Skin:    General: Skin is warm and dry.  Neurological:     Mental Status: She is oriented to person, place, and time.        LABORATORY DATA: I have reviewed the data as listed    Latest Ref Rng & Units 05/10/2022    6:01 AM 05/09/2022   11:35 AM 05/09/2022    4:48 AM  CBC  WBC 4.0 - 10.5 K/uL 4.8  7.6  9.1   Hemoglobin 12.0 - 15.0 g/dL 75.3  00.5  11.0   Hematocrit 36.0 - 46.0 % 31.7  35.7  41.7   Platelets 150 - 400 K/uL 53  83  101         Latest Ref Rng & Units 05/10/2022    6:01 AM 05/09/2022   10:33 PM 05/09/2022   11:35 AM  CMP  Glucose 70 - 99 mg/dL 92     BUN 6 - 20 mg/dL 13     Creatinine 2.11 - 1.00 mg/dL 1.73   5.67   Sodium 014 - 145 mmol/L 138     Potassium 3.5 - 5.1 mmol/L 3.9  4.0    Chloride 98 - 111 mmol/L 108      CO2 22 - 32 mmol/L 23     Calcium 8.9 - 10.3 mg/dL 8.6          RADIOGRAPHIC STUDIES (from last 24 hours if applicable) I have personally reviewed the radiological images as listed and agreed with the findings in the report. No results found.      Visit Diagnosis: 1. Pancreatic adenocarcinoma   2. Nausea and vomiting, unspecified vomiting type      No orders of the defined types were placed in this encounter.   All questions were answered. The patient knows to call the clinic with any problems, questions or concerns. No barriers to learning was detected.  I have spent a total of 30 minutes minutes of face-to-face and non-face-to-face time, preparing to see the patient, obtaining and/or reviewing separately obtained history, performing a medically appropriate  examination, counseling and educating the patient, ordering medications, documenting clinical information in the electronic health record, and care coordination (communications with other health care professionals or caregivers).    Thank you for allowing me to participate in the care of this patient.    Shanon Ace, PA-C Department of Hematology/Oncology [facility] at [facility] Phone: [phone]  [phone]    05/17/2022 4:44 PM

## 2022-05-17 NOTE — Progress Notes (Signed)
Nutrition Assessment   Reason for Assessment: [facility] referral (altered taste, diarrhea, poor appetite, wt loss)   ASSESSMENT: 57 year old female with pancreatic carcinoma metastatic to liver. She is receiving modified Folfirinox q14d (start 2/20). Patient is under the care of Dr. Leonides Schanz  Past medical history includes HTN, migraines, GERD, ventral hernia s/p repair 2015, insomnia, panic attacks  Met with patient in [facility]. Patient receiving IVF + nausea medication. Husband of patient present at visit today. Patient with daily nausea and vomiting episodes. Patient says she often vomits after po medications. Patient is currently using scopolamine patch. This is not helping per pt. She is having watery bowel movements. Patient states these are greenish in color. She is taking lomotil. Patient she has not had a real meal in weeks. Yesterday pt tolerated 1/4 cup of smoothie, 7-8 blueberries, broth, 6 oz sprite, sips of water.   Nutrition Focused Physical Exam: deferred   Medications: norvasc, celexa, lomotil, relpax, microzide, norco, vicoprofen, ativan, mag-ox, magic mouthwash, morphine, zofran, phenergan, scopolamine   Labs: 4/2 reviewed   Anthropometrics:   Height: 5'2" Weight: 135 lb 9.6 oz  UBW: 160 lb 4.8 oz (03/14/22) BMI: 24.80    NUTRITION DIAGNOSIS: Unintended weight loss related to pancreatic cancer as evidenced by 16% (25 lb) decrease from usual weight in 2 months - this is severe    INTERVENTION:  Educated on small frequent meals/snacks (bits/sips) q2h, suggested eating by the clock - snacks ideas/smoothie recipes  Discussed strategies for nausea, foods best tolerated and foods to avoid - handout provided Suggestions on ways to add calories/protein to foods - samples of CIB  Discussed strategies for diarrhea, provided samples of Banatrol for pt to try Consider trial of Reglan to promote motility Consider zofran odt  Contact information given   MONITORING, EVALUATION, GOAL:  Patient will tolerate increased calories and protein to minimize further weight loss    Next Visit: Wednesday April 24 during infusion

## 2022-05-18 ENCOUNTER — Inpatient Hospital Stay: Payer: BC Managed Care – PPO | Admitting: Dietician

## 2022-05-18 MED FILL — Dexamethasone Sodium Phosphate Inj 100 MG/10ML: INTRAMUSCULAR | Qty: 1 | Status: AC

## 2022-05-18 MED FILL — Fosaprepitant Dimeglumine For IV Infusion 150 MG (Base Eq): INTRAVENOUS | Qty: 5 | Status: AC

## 2022-05-19 ENCOUNTER — Other Ambulatory Visit: Payer: Self-pay | Admitting: *Deleted

## 2022-05-19 ENCOUNTER — Inpatient Hospital Stay: Payer: BC Managed Care – PPO

## 2022-05-19 ENCOUNTER — Inpatient Hospital Stay (HOSPITAL_BASED_OUTPATIENT_CLINIC_OR_DEPARTMENT_OTHER): Payer: BC Managed Care – PPO | Admitting: Hematology and Oncology

## 2022-05-19 VITALS — BP 115/77 | HR 111 | Temp 98.7°F | Resp 17 | Wt 137.8 lb

## 2022-05-19 VITALS — BP 111/74 | HR 93 | Resp 17

## 2022-05-19 DIAGNOSIS — Z95828 Presence of other vascular implants and grafts: Secondary | ICD-10-CM | POA: Diagnosis not present

## 2022-05-19 DIAGNOSIS — R112 Nausea with vomiting, unspecified: Secondary | ICD-10-CM

## 2022-05-19 DIAGNOSIS — C250 Malignant neoplasm of head of pancreas: Secondary | ICD-10-CM | POA: Diagnosis not present

## 2022-05-19 DIAGNOSIS — Z803 Family history of malignant neoplasm of breast: Secondary | ICD-10-CM | POA: Diagnosis not present

## 2022-05-19 DIAGNOSIS — C259 Malignant neoplasm of pancreas, unspecified: Secondary | ICD-10-CM

## 2022-05-19 DIAGNOSIS — R630 Anorexia: Secondary | ICD-10-CM | POA: Diagnosis not present

## 2022-05-19 DIAGNOSIS — E876 Hypokalemia: Secondary | ICD-10-CM

## 2022-05-19 DIAGNOSIS — R110 Nausea: Secondary | ICD-10-CM | POA: Diagnosis not present

## 2022-05-19 DIAGNOSIS — C787 Secondary malignant neoplasm of liver and intrahepatic bile duct: Secondary | ICD-10-CM | POA: Diagnosis not present

## 2022-05-19 DIAGNOSIS — R197 Diarrhea, unspecified: Secondary | ICD-10-CM | POA: Diagnosis not present

## 2022-05-19 LAB — CBC WITH DIFFERENTIAL (CANCER CENTER ONLY)
Abs Immature Granulocytes: 0.06 10*3/uL (ref 0.00–0.07)
Basophils Absolute: 0 10*3/uL (ref 0.0–0.1)
Basophils Relative: 0 %
Eosinophils Absolute: 0 10*3/uL (ref 0.0–0.5)
Eosinophils Relative: 0 %
HCT: 34.9 % — ABNORMAL LOW (ref 36.0–46.0)
Hemoglobin: 11.4 g/dL — ABNORMAL LOW (ref 12.0–15.0)
Immature Granulocytes: 1 %
Lymphocytes Relative: 16 %
Lymphs Abs: 1.7 10*3/uL (ref 0.7–4.0)
MCH: 27.8 pg (ref 26.0–34.0)
MCHC: 32.7 g/dL (ref 30.0–36.0)
MCV: 85.1 fL (ref 80.0–100.0)
Monocytes Absolute: 1.2 10*3/uL — ABNORMAL HIGH (ref 0.1–1.0)
Monocytes Relative: 11 %
Neutro Abs: 7.9 10*3/uL — ABNORMAL HIGH (ref 1.7–7.7)
Neutrophils Relative %: 72 %
Platelet Count: 185 10*3/uL (ref 150–400)
RBC: 4.1 MIL/uL (ref 3.87–5.11)
RDW: 15.2 % (ref 11.5–15.5)
WBC Count: 10.9 10*3/uL — ABNORMAL HIGH (ref 4.0–10.5)
nRBC: 0.2 % (ref 0.0–0.2)

## 2022-05-19 LAB — CMP (CANCER CENTER ONLY)
ALT: 17 U/L (ref 0–44)
AST: 20 U/L (ref 15–41)
Albumin: 3.9 g/dL (ref 3.5–5.0)
Alkaline Phosphatase: 123 U/L (ref 38–126)
Anion gap: 8 (ref 5–15)
BUN: 13 mg/dL (ref 6–20)
CO2: 33 mmol/L — ABNORMAL HIGH (ref 22–32)
Calcium: 9.7 mg/dL (ref 8.9–10.3)
Chloride: 102 mmol/L (ref 98–111)
Creatinine: 0.69 mg/dL (ref 0.44–1.00)
GFR, Estimated: >60 mL/min (ref >60)
Glucose, Bld: 118 mg/dL — ABNORMAL HIGH (ref 70–99)
Potassium: 2.8 mmol/L — ABNORMAL LOW (ref 3.5–5.1)
Sodium: 143 mmol/L (ref 135–145)
Total Bilirubin: 0.3 mg/dL (ref 0.3–1.2)
Total Protein: 7.5 g/dL (ref 6.5–8.1)

## 2022-05-19 MED ORDER — SODIUM CHLORIDE 0.9 % IV SOLN
Freq: Once | INTRAVENOUS | Status: DC
  Filled 2022-05-19: qty 20

## 2022-05-19 MED ORDER — SODIUM CHLORIDE 0.9% FLUSH
10.0000 mL | Freq: Once | INTRAVENOUS | Status: AC
  Administered 2022-05-19: 10 mL

## 2022-05-19 MED ORDER — ONDANSETRON HCL 4 MG/2ML IJ SOLN
8.0000 mg | Freq: Once | INTRAMUSCULAR | Status: AC
  Administered 2022-05-19: 8 mg via INTRAVENOUS
  Filled 2022-05-19: qty 4

## 2022-05-19 MED ORDER — POTASSIUM CHLORIDE IN NACL 40-0.9 MEQ/L-% IV SOLN
Freq: Once | INTRAVENOUS | Status: AC
  Filled 2022-05-19: qty 1000

## 2022-05-19 MED ORDER — HYDROMORPHONE HCL 1 MG/ML IJ SOLN
1.0000 mg | Freq: Once | INTRAMUSCULAR | Status: AC
  Administered 2022-05-19: 1 mg via INTRAVENOUS
  Filled 2022-05-19: qty 1

## 2022-05-19 MED ORDER — OXYCODONE HCL 5 MG PO TABS: 5.0000 mg | ORAL_TABLET | Freq: Once | ORAL | Status: DC

## 2022-05-19 NOTE — Progress Notes (Signed)
Pt c/o stomach pain, cramping feeling.  Pt refuses food/drink and husband is insisting on pain medication IV.  Order placed for IV dilaudid by B. [facility] RN

## 2022-05-19 NOTE — Progress Notes (Signed)
[facility] Telephone:([phone]) [phone]   [phone]  PROGRESS NOTE:  Patient Care Team: Angelica Chessman, MD as PCP - General (Family Medicine) Reece Packer, MD as Consulting Physician (Medical Oncology)  CHIEF COMPLAINTS/PURPOSE OF CONSULTATION:  Metastatic pancreatic adenocarcinoma involving the liver  ONCOLOGIC HISTORY: Presented with nausea and right upper quadrant pain 03/08/2022: Abdominal US showed multiple hypoechoic masses within the liver are indeterminate.  03/10/2022: MR abdomen: Hypoenhancing mass compatible with pancreatic head adenocarcinoma with numerous targetoid enhancing masses in all segments of the liver, compatible with metastatic disease. The mass severely effaces and nearly occludes the portal vein and splenic vein, and occludes the superior mesenteric vein with some collaterals noted. The mass substantially abuts the proximal transverse duodenum, although overt duodenal invasion is indeterminate. 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 Prominent stool throughout the colon favors constipation. Fluid-fluid level in the gallbladder likely from sludge. 03/14/2022: Establish care with [facility]ology/Oncology 03/17/2022: CT chest: no definitive evidence of lung metastases.  03/22/2022: Liver biopsy confirmed metastatic adenocarcinoma, pancreatic primary.  03/30/2022: Cycle 1, Day 1 of mFOLFIRINOX 04/14/2022: Cycle 2, Day 1 of mFOLFIRINOX HELD due to ANC 300.  04/20/2022: Cycle 2, Day 1 of mFOLFIRINOX 05/04/2022: Cycle 3, Day 1 of mFOLFIRINOX 05/19/2022: Cycle 4, Day 1 of mFOLFIRINOX HELD per patient request.    HISTORY OF PRESENTING ILLNESS:  Kaitlin Ferrell presents today for a follow up before Cycle 4, Day 1 of mFOLFIRINOX.   [name] reports she feels awful.  She is struggling with nausea, diarrhea, and poor appetite.  She also has thick saliva and a considerable drop in her weight.  She is down to 137 pounds from 156 pounds on 04/20/2022.  She notes sometimes when she tries to eat she gags or throws up.  This includes trying to take down her nausea pill and occasionally pain pills.  She is having some swelling in her right arm where she previously had the PICC.  She notes that food taste quite poorly and that she feels like her "taste buds have been burned".  She notes that she does enjoy Sprite and Coca-Cola when she is able to keep it down.  Overall she notes that she would like a chemotherapy holiday and needs a brief break.  She denies fevers, chills, sweats, shortness of breath, chest pain, cough, peripheral neuropathy or cold sensitivity. She has no other complaints. Rest of the 10 point ROS is below.   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   treated with 4 cycles of actinomycin D from 12-02-11 thru 01-13-12   H/O molar pregnancy, antepartum    Hypertension    Migraine    Miscarriage     SURGICAL HISTORY: Past Surgical History:  Procedure Laterality Date   BREAST BIOPSY  2001   Left   BREAST LUMPECTOMY     DILATION AND CURETTAGE OF UTERUS  1992, 09/2011   DILATION AND EVACUATION  2011   INSERTION OF MESH N/A 09/03/2013   Procedure:  INSERTION OF MESH;  Surgeon: Ardeth Sportsman, MD;  Location: [facility];  Service: General;  Laterality: N/A;   IR CHEST FLUORO  04/14/2022   IR IMAGING GUIDED PORT INSERTION  03/22/2022   IR IMAGING GUIDED PORT INSERTION  05/02/2022   IR PORT REPAIR CENTRAL  VENOUS ACCESS DEVICE  04/19/2022   IR REMOVAL TUN CV CATH W/O FL  05/02/2022   IR US LIVER BIOPSY  03/22/2022   PORTACATH PLACEMENT  11/2011   PORTACATH PLACEMENT     REMOVAL  MARCH 2014   VENTRAL HERNIA REPAIR N/A 09/03/2013   Procedure: LAPAROSCOPIC VENTRAL WALL HERNIA REPAIR;  Surgeon: Ardeth Sportsman, MD;  Location: [facility];  Service: General;  Laterality: N/A;   WISDOM TOOTH EXTRACTION     in 11th grade    SOCIAL HISTORY: Social History   Socioeconomic History   Marital status: Married    Spouse name: Kaitlin Ferrell   Number of children: 2   Years of education: college   Highest education level: Not on file  Occupational History    Comment: [employer]  Tobacco Use   Smoking status: Never   Smokeless tobacco: Never  Substance and Sexual Activity   Alcohol use: Yes    Alcohol/week: 1.0 standard drink of alcohol    Types: 1 Glasses of wine per week    Comment: occas   Drug use: No   Sexual activity: Not Currently    Birth control/protection: Pill  Other Topics Concern   Not on file  Social History Narrative   Patient is married Kaitlin Ferrell). Patient works part time at [employer].   Right handed.   Patient has her CMA.   Caffeine- None         Social Determinants of Health   Financial Resource Strain: Not on file  Food Insecurity: No Food Insecurity (05/10/2022)   Hunger Vital Sign    Worried About Running Out of Food in the Last Year: Never true    Ran Out of Food in the Last Year: Never true  Transportation Needs: No Transportation Needs (05/10/2022)   PRAPARE - Administrator, Civil Service (Medical): No    Lack of Transportation (Non-Medical): No  Physical Activity: Not on file  Stress: Not on file  Social Connections: Not on file  Intimate Partner Violence: Not At Risk (05/10/2022)   Humiliation, Afraid, Rape, and Kick questionnaire    Fear of Current or Ex-Partner: No    Emotionally Abused: No    Physically Abused: No    Sexually Abused: No    FAMILY  HISTORY: Family History  Problem Relation Age of Onset   Breast cancer Mother        dx > 50   Heart attack Mother    Diabetes Father    Breast cancer Maternal Aunt        dx > 50   Breast cancer Maternal Aunt        dx > 50   Breast cancer Maternal Grandmother        ? < 50   Stroke Paternal Grandmother    Alcoholism Other     ALLERGIES:  is allergic to compazine [prochlorperazine edisylate], propofol, oxycodone-acetaminophen, prochlorperazine, and labetalol.  MEDICATIONS:  Current Outpatient Medications  Medication Sig Dispense Refill   hydrocodone-ibuprofen (VICOPROFEN) 5-200 MG tablet Take 1-2 tablets by mouth every 6 (six) hours as needed for pain. 60 tablet 0   lidocaine-prilocaine (EMLA) cream Apply 1 Application topically as needed.  30 g 0   LORazepam (ATIVAN) 0.5 MG tablet Take 0.5 mg by mouth as needed for anxiety.     scopolamine (TRANSDERM-SCOP) 1 MG/3DAYS Place 1 patch (1.5 mg total) onto the skin every 3 (three) days. 10 patch 12   amLODipine (NORVASC) 10 MG tablet Take 1 tablet by mouth daily.     Cholecalciferol 1.25 MG (50000 UT) capsule Take 50,000 Units by mouth once a week. (Patient not taking: Reported on 05/09/2022)     citalopram (CELEXA) 10 MG tablet Take by mouth. (Patient not taking: Reported on 05/09/2022)     diphenoxylate-atropine (LOMOTIL) 2.5-0.025 MG tablet Take 1 tablet by mouth 4 (four) times daily as needed for diarrhea or loose stools. 30 tablet 0   eletriptan (RELPAX) 40 MG tablet Take 1 tablet (40 mg total) by mouth as needed for migraine or headache. May repeat in 2 hours if needed. 15 tablet 6   hydrochlorothiazide (MICROZIDE) 12.5 MG capsule Take 12.5 mg by mouth daily as needed (swelling). (Patient not taking: Reported on 05/19/2022)     magic mouthwash (multi-ingredient) oral suspension Swish and swallow 5 mLs by mouth 4 times daily as needed for throat discomfort 400 mL 1   magnesium oxide (MAG-OX) 400 (241.3 MG) MG tablet TAKE 1 TABLET BY MOUTH  TWICE A DAY (Patient not taking: Reported on 05/19/2022) 60 tablet 6   morphine (MS CONTIN) 30 MG 12 hr tablet Take 1 tablet (30 mg total) by mouth every 12 (twelve) hours. 60 tablet 0   OLANZapine (ZYPREXA) 10 MG tablet TAKE 1 TABLET BY MOUTH EVERYDAY AT BEDTIME (Patient taking differently: Take 10 mg by mouth at bedtime.) 90 tablet 1   ondansetron (ZOFRAN) 8 MG tablet Take 1 tablet (8 mg total) by mouth every 8 (eight) hours as needed for nausea or vomiting. 90 tablet 0   ondansetron (ZOFRAN-ODT) 8 MG disintegrating tablet Take 1 tablet (8 mg total) by mouth every 8 (eight) hours as needed for nausea or vomiting. 20 tablet 0   promethazine (PHENERGAN) 25 MG tablet Take 1 tablet (25 mg total) by mouth every 6 (six) hours as needed for nausea or vomiting. 30 tablet 0   No current facility-administered medications for this visit.    REVIEW OF SYSTEMS:   Constitutional: ( - ) fevers, ( - )  chills , ( - ) night sweats Eyes: ( - ) blurriness of vision, ( - ) double vision, ( - ) watery eyes Ears, nose, mouth, throat, and face: ( - ) mucositis, ( - ) sore throat Respiratory: ( - ) cough, ( - ) dyspnea, ( - ) wheezes Cardiovascular: ( - ) palpitation, ( - ) chest discomfort, ( - ) lower extremity swelling Gastrointestinal:  (+) nausea, ( - ) heartburn, ( +) change in bowel habits Skin: ( - ) abnormal skin rashes Lymphatics: ( - ) new lymphadenopathy, ( - ) easy bruising Neurological: ( - ) numbness, ( - ) tingling, ( - ) new weaknesses Behavioral/Psych: ( - ) mood change, ( - ) new changes  All other systems were reviewed with the patient and are negative.  PHYSICAL EXAMINATION: ECOG PERFORMANCE STATUS: 1 - Symptomatic but completely ambulatory  Vitals:   05/19/22 0936  BP: 115/77  Pulse: (!) 111  Resp: 17  Temp: 98.7 F (37.1 C)  SpO2: 99%      Filed Weights   05/19/22 0936  Weight: 137 lb 12.8 oz (62.5 kg)      GENERAL: well appearing female  in NAD  SKIN: skin color,  texture, turgor are normal, no rashes or significant lesions EYES: conjunctiva are pink and non-injected, sclera clear LUNGS: clear to auscultation and percussion with normal breathing effort HEART: regular rate & rhythm and no murmurs and no lower extremity edema ABDOMEN:  normal bowel sounds. Abdomen is tender to palpation in epigastric and RUQ region. No palpable masses or hepatomegaly appreciated.  Musculoskeletal: no cyanosis of digits and no clubbing  PSYCH: alert & oriented x 3, fluent speech NEURO: no focal motor/sensory deficits  LABORATORY DATA:  I have reviewed the data as listed    Latest Ref Rng & Units 05/19/2022    9:14 AM 05/10/2022    6:01 AM 05/09/2022   11:35 AM  CBC  WBC 4.0 - 10.5 K/uL 10.9  4.8  7.6   Hemoglobin 12.0 - 15.0 g/dL 16.111.4  09.610.2  04.511.6   Hematocrit 36.0 - 46.0 % 34.9  31.7  35.7   Platelets 150 - 400 K/uL 185  53  83        Latest Ref Rng & Units 05/19/2022    9:14 AM 05/10/2022    6:01 AM 05/09/2022   10:33 PM  CMP  Glucose 70 - 99 mg/dL 409118  92    BUN 6 - 20 mg/dL 13  13    Creatinine 8.110.44 - 1.00 mg/dL 9.140.69  7.820.68    Sodium 956135 - 145 mmol/L 143  138    Potassium 3.5 - 5.1 mmol/L 2.8  3.9  4.0   Chloride 98 - 111 mmol/L 102  108    CO2 22 - 32 mmol/L 33  23    Calcium 8.9 - 10.3 mg/dL 9.7  8.6    Total Protein 6.5 - 8.1 g/dL 7.5     Total Bilirubin 0.3 - 1.2 mg/dL 0.3     Alkaline Phos 38 - 126 U/L 123     AST 15 - 41 U/L 20     ALT 0 - 44 U/L 17        RADIOGRAPHIC STUDIES: I have personally reviewed the radiological images as listed and agreed with the findings in the report. DG Chest Port 1 View  Result Date: 05/06/2022 CLINICAL DATA:  Leaking Port-A-Cath EXAM: PORTABLE CHEST 1 VIEW COMPARISON:  04/14/2022, 05/02/2022 FINDINGS: New left-sided chest wall port is noted with catheter tip in the right atrium. Port needle appears well placed on this single view. Cardiac shadow is within normal limits. Lungs are clear bilaterally. No bony  abnormality is noted. IMPRESSION: No acute abnormality is noted.  Port needle appears well placed. Electronically Signed   By: Alcide CleverMark  Lukens M.D.   On: 05/06/2022 00:33   IR Removal Tun Cv Cath W/O FL  Result Date: 05/03/2022 INDICATION: Metastatic pancreatic cancer, post placement image guided Port a catheter on 03/22/2022. Unfortunately, port a catheter reservoir was noted to be tilted resulting in an inability to allow for successful port reservoir access. As such, patient underwent definitive Port revision and tacking on 04/19/2022, however despite this intervention the infusion center has still not been able to successfully accessed the patient's port a catheter. As such, patient returns today [facility]. EXAM: 1. IMPLANTED PORT A CATH PLACEMENT WITH ULTRASOUND AND FLUOROSCOPIC GUIDANCE 2. PORT A CATHETER REMOVAL COMPARISON:  Port a catheter revision-04/19/2022 Image guided PICC line placement-04/20/2022 Port a catheter placement-03/22/2022 MEDICATIONS: Ancef 2 g IV; the antibiotics were administered within an appropriate time  frame prior to the initiation of the procedure. ANESTHESIA/SEDATION: Moderate (conscious) sedation was employed during this procedure as administered by the Interventional Radiology RN. A total of Versed 3 mg and Fentanyl 150 mcg was administered intravenously. Moderate Sedation Time: 51 minutes. The patient's level of consciousness and vital signs were monitored continuously by radiology nursing throughout the procedure under my direct supervision. CONTRAST:  None FLUOROSCOPY TIME:  40 seconds (4 mGy) COMPLICATIONS: None immediate. PROCEDURE: The procedure, risks, benefits, and alternatives were explained to the patient. Questions regarding the procedure were encouraged and answered. The patient understands and consents to the procedure. Given the presence of the malpositioned right internal jugular approach  port a catheter, the decision was made to proceed with definitive left internal jugular approach port a catheter placement. As such, the left neck and chest were prepped with chlorhexidine in a sterile fashion, and a sterile drape was applied covering the operative field. Maximum barrier sterile technique with sterile gowns and gloves were used for the procedure. A timeout was performed prior to the initiation of the procedure. Local anesthesia was provided with 1% lidocaine with epinephrine. After creating a small venotomy incision, a micropuncture kit was utilized to access the internal jugular vein. Real-time ultrasound guidance was utilized for vascular access including the acquisition of a permanent ultrasound image documenting patency of the accessed vessel. The microwire was utilized to measure appropriate catheter length. A subcutaneous port pocket was then created along the upper chest wall utilizing a combination of sharp and blunt dissection. The pocket was irrigated with sterile saline. A single lumen ISP sized power injectable port was chosen for placement. The 8 Fr catheter was tunneled from the port pocket site to the venotomy incision. The port was placed in the pocket. The external catheter was trimmed to appropriate length. At the venotomy, an 8 Fr peel-away sheath was placed over a guidewire under fluoroscopic guidance. The catheter was then placed through the sheath and the sheath was removed. Final catheter positioning was confirmed and documented with a fluoroscopic spot radiograph. The port was accessed with a Huber needle, aspirated and flushed with heparinized saline. The venotomy site was closed with an interrupted 4-0 Vicryl suture. The port pocket incision was closed with interrupted 2-0 Vicryl suture. The skin was opposed with a running subcuticular 4-0 Vicryl suture. Dermabond and Steri-strips were applied to both incisions. Dressings were applied. Attention was now paid towards removal  of the malpositioned right-sided port a catheter. The skin overlying the operative site was prepped and draped in usual sterile fashion. After the adjacent subcutaneous tissues were anesthetized with 1% lidocaine with epinephrine, the port a Catheter was removed intact using a combination of sharp and blunt dissection. Port incision was closed with an interrupted 2-0 Vicryl suture. Dermabond and Steri-Strips were applied to the incision. Dressings were applied. The patient tolerated the above procedures well without immediate postprocedural complication. FINDINGS: After catheter placement, the left internal jugular approach port a catheter tip lies within the superior cavoatrial junction. The catheter aspirates and flushes normally and is ready for immediate use. After definitive removal of the right internal jugular approach port a catheter, the left internal jugular approach port a catheter as well as the right upper extremity approach PICC line are unchanged in position. IMPRESSION: 1. Successful placement of a left internal jugular approach power injectable Port-A-Cath. The catheter is ready for immediate use. 2. Successful removal of right internal jugular approach port a catheter. PLAN: - The patient is scheduled for chemotherapy  this coming Wednesday (05/04/2022). - If the left internal jugular approach port a catheter is successfully accessed and utilized for treatment, the patient may return to the interventional radiology department for removal of the right upper extremity approach PICC line as indicated. Electronically Signed   By: Simonne Come M.D.   On: 05/03/2022 12:33   IR IMAGING GUIDED PORT INSERTION  Result Date: 05/03/2022 INDICATION: Metastatic pancreatic cancer, post placement image guided Port a catheter on 03/22/2022. Unfortunately, port a catheter reservoir was noted to be tilted resulting in an inability to allow for successful port reservoir access. As such, patient underwent definitive  Port revision and tacking on 04/19/2022, however despite this intervention the infusion center has still not been able to successfully accessed the patient's port a catheter. As such, patient returns today to the interventional radiology department for definitive [facility] a catheter replacement. EXAM: 1. IMPLANTED PORT A CATH PLACEMENT WITH ULTRASOUND AND FLUOROSCOPIC GUIDANCE 2. PORT A CATHETER REMOVAL COMPARISON:  Port a catheter revision-04/19/2022 Image guided PICC line placement-04/20/2022 Port a catheter placement-03/22/2022 MEDICATIONS: Ancef 2 g IV; the antibiotics were administered within an appropriate time frame prior to the initiation of the procedure. ANESTHESIA/SEDATION: Moderate (conscious) sedation was employed during this procedure as administered by the Interventional Radiology RN. A total of Versed 3 mg and Fentanyl 150 mcg was administered intravenously. Moderate Sedation Time: 51 minutes. The patient's level of consciousness and vital signs were monitored continuously by radiology nursing throughout the procedure under my direct supervision. CONTRAST:  None FLUOROSCOPY TIME:  40 seconds (4 mGy) COMPLICATIONS: None immediate. PROCEDURE: The procedure, risks, benefits, and alternatives were explained to the patient. Questions regarding the procedure were encouraged and answered. The patient understands and consents to the procedure. Given the presence of the malpositioned right internal jugular approach port a catheter, the decision was made to proceed with definitive left internal jugular approach port a catheter placement. As such, the left neck and chest were prepped with chlorhexidine in a sterile fashion, and a sterile drape was applied covering the operative field. Maximum barrier sterile technique with sterile gowns and gloves were used for the procedure. A timeout was performed prior to the initiation of the procedure. Local anesthesia was provided with 1% lidocaine with epinephrine. After  creating a small venotomy incision, a micropuncture kit was utilized to access the internal jugular vein. Real-time ultrasound guidance was utilized for vascular access including the acquisition of a permanent ultrasound image documenting patency of the accessed vessel. The microwire was utilized to measure appropriate catheter length. A subcutaneous port pocket was then created along the upper chest wall utilizing a combination of sharp and blunt dissection. The pocket was irrigated with sterile saline. A single lumen ISP sized power injectable port was chosen for placement. The 8 Fr catheter was tunneled from the port pocket site to the venotomy incision. The port was placed in the pocket. The external catheter was trimmed to appropriate length. At the venotomy, an 8 Fr peel-away sheath was placed over a guidewire under fluoroscopic guidance. The catheter was then placed through the sheath and the sheath was removed. Final catheter positioning was confirmed and documented with a fluoroscopic spot radiograph. The port was accessed with a Huber needle, aspirated and flushed with heparinized saline. The venotomy site was closed with an interrupted 4-0 Vicryl suture. The port pocket incision was closed with interrupted 2-0 Vicryl suture. The skin was opposed with a running subcuticular 4-0 Vicryl suture. Dermabond and Steri-strips were applied to both incisions.  Dressings were applied. Attention was now paid towards removal of the malpositioned right-sided port a catheter. The skin overlying the operative site was prepped and draped in usual sterile fashion. After the adjacent subcutaneous tissues were anesthetized with 1% lidocaine with epinephrine, the port a Catheter was removed intact using a combination of sharp and blunt dissection. Port incision was closed with an interrupted 2-0 Vicryl suture. Dermabond and Steri-Strips were applied to the incision. Dressings were applied. The patient tolerated the above  procedures well without immediate postprocedural complication. FINDINGS: After catheter placement, the left internal jugular approach port a catheter tip lies within the superior cavoatrial junction. The catheter aspirates and flushes normally and is ready for immediate use. After definitive removal of the right internal jugular approach port a catheter, the left internal jugular approach port a catheter as well as the right upper extremity approach PICC line are unchanged in position. IMPRESSION: 1. Successful placement of a left internal jugular approach power injectable Port-A-Cath. The catheter is ready for immediate use. 2. Successful removal of right internal jugular approach port a catheter. PLAN: - The patient is scheduled for chemotherapy this coming Wednesday (05/04/2022). - If the left internal jugular approach port a catheter is successfully accessed and utilized for treatment, the patient may return to the interventional radiology department for removal of the right upper extremity approach PICC line as indicated. Electronically Signed   By: Simonne Come M.D.   On: 05/03/2022 12:33   IR PICC PLACEMENT RIGHT >5 YRS INC IMG GUIDE  Result Date: 04/20/2022 INDICATION: History of metastatic pancreatic cancer on chemotherapy. Current port malfunction. Request PICC line placement for intravenous therapies. EXAM: ULTRASOUND AND FLUOROSCOPIC GUIDED RIGHT UPPER EXTREMITY PICC LINE INSERTION MEDICATIONS: 1% plain lidocaine, 1 mL CONTRAST:  None FLUOROSCOPY TIME:  Radiation exposure index: (2  MGy) COMPLICATIONS: None immediate. TECHNIQUE: The procedure, risks, benefits, and alternatives were explained to the patient and informed written consent was obtained. A timeout was performed prior to the initiation of the procedure. The right upper extremity was prepped with chlorhexidine in a sterile fashion, and a sterile drape was applied covering the operative field. Maximum barrier sterile technique with sterile  gowns and gloves were used for the procedure. A timeout was performed prior to the initiation of the procedure. Local anesthesia was provided with 1% lidocaine. Under direct ultrasound guidance, the basilic vein was accessed with a micropuncture kit after the overlying soft tissues were anesthetized with 1% lidocaine. After the overlying soft tissues were anesthetized, a small venotomy incision was created and a micropuncture kit was utilized to access the right basilic vein. Real-time ultrasound guidance was utilized for vascular access including the acquisition of a permanent ultrasound image documenting patency of the accessed vessel. A guidewire was advanced to the level of the superior caval-atrial junction for measurement purposes and the PICC line was cut to length. A peel-away sheath was placed and a 35 cm, 5 [place], dual lumen was inserted to level of the superior caval-atrial junction. A post procedure spot fluoroscopic was obtained. The catheter easily aspirated and flushed and was secured in place. A dressing was placed. The patient tolerated the procedure well without immediate post procedural complication. FINDINGS: After catheter placement, the tip lies within the superior cavoatrial junction. The catheter aspirates and flushes normally and is ready for immediate use. IMPRESSION: Successful ultrasound and fluoroscopic guided placement of a right basilic vein approach, 35 cm, 5 French, dual lumen PICC with tip at the superior caval-atrial junction. The PICC  line is ready for immediate use. Read by: Brayton El PA-C Electronically Signed   By: Judie Petit.  Shick M.D.   On: 04/20/2022 15:05   IR Port Repair Central Venous Access Device  Result Date: 04/19/2022 INDICATION: 57 year old female with question of port malfunction EXAM: IMAGE GUIDED PORT CATHETER REVISION MEDICATIONS: None. ANESTHESIA/SEDATION: Moderate (conscious) sedation was employed during this procedure. A total of Versed 2.0 mg and Fentanyl  100 mcg was administered intravenously by the radiology nurse. Total intra-service moderate Sedation Time: 31 minutes. The patient's level of consciousness and vital signs were monitored continuously by radiology nursing throughout the procedure under my direct supervision. FLUOROSCOPY: Radiation Exposure Index (as provided by the fluoroscopic device): 0 mGy Kerma COMPLICATIONS: None PROCEDURE: The procedure, risks, benefits, and alternatives were explained to the patient. Questions regarding the procedure were encouraged and answered. The patient understands and consents to the procedure. The right neck and chest were prepped with chlorhexidine in a sterile fashion, and a sterile drape was applied covering the operative field. Maximum barrier sterile technique with sterile gowns and gloves were used for the procedure. A timeout was performed prior to the initiation of the procedure. Local anesthesia was provided with 1% lidocaine with epinephrine. Physical exam was performed, and initial image was stored. After initiation of moderate sedation, local anesthesia was administered at the right chest wall port catheter site. Incision was made along the prior scar after local anesthesia was applied. Blunt dissection was used to open the port pocket to observe the orientation of the port catheter. We observed that the prior retention suture was in place at the apex of the port and that the port was not inverted/rotated. There is a slight leftward orientation of the rubber septum. At this point using physical exam and manual palpation, a 1 inch Huber needle was used to access the port. Note that the 1 inch Huber needle is hub once the needle is in place in the port. Then, using a hemostat the hub of the port catheter was grasped and the port was rotated towards the patient's right side. Aspiration and flush was confirmed through the 1 inch Huber needle. 500 units of Hep-Lock in 5 cc was then administered. At this time a 0  Prolene suture was used to anchor the right corner of the port catheter to the soft tissues in the port pocket, for a more central orientation of the rubber septum. The original retention suture was left intact. The pocket was irrigated copiously with sterile saline. The port pocket incision was closed with interrupted 2-0 Vicryl suture and the skin was opposed with a running subcuticular 4-0 Vicryl suture. Dermabond was applied. Dressings were placed. The patient tolerated the procedure well without immediate post procedural complication. FINDINGS: Upon initial physical exam, the rubber septum is palpable under the skin surface, with the septum oriented slightly towards the patient's left shoulder. Upon opening the port catheter, the stay suture was confirmed to be intact, with the septum oriented slightly towards the patient's left shoulder. A more midline orientation was attempted with rotating the port in the pocket, with a second stay suture placed at the right apex. We confirmed access was capable through the skin service with a 1 inch Huber needle. The 1 inch Huber needle is hub at the skin surface. Perhaps further access may be easier with 1.25 inch or 1.5 inch Huber needle. Directing slightly towards the patient's right allows confident access. IMPRESSION: Status post image guided revision of right-sided IJ port catheter, with  additional retention suture placed at the right apex, and function confirmed. Signed, Yvone Neu. Miachel Roux, RPVI [facility] Electronically Signed   By: Gilmer Mor D.O.   On: 04/19/2022 13:27    ASSESSMENT & PLAN Kaitlin Ferrell is a 57 y.o. female who returns to the clinic for newly diagnosed pancreatic adenocarcinoma.    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 Dr. Leonides Schanz recommends chemotherapy regimen with FOLFIRINOX q 2 weeks.  --Plan to obtain restaging CT scans every 3 months to assess treatment response.  --[facility] Testing: MS-stable, 2 Muts/Mb, ATMH626fs*31, KRASG12L, MDM2 amplification, CDKN2A/B. No targetable mutations.  --Started mFOLFIRINOX on 03/30/2022 PLAN: --Due to start Cycle 4, Day 1 of FOLFIRINOX today, will HOLD per patient request.  --Labs from today reviewed with patient.  White blood cell 10.9, hemoglobin 1.4, MCV 85.1, and platelets of 185.  LFTs within normal limits and creatinine 0.69. --Discussed options with patient moving forward.  We could consider either dose reducing the FOLFIRINOX, transitioning to gem/paclitaxel, or discontinuing chemotherapy altogether.  At this time I would favor continuing treatment with a modified or different regimen. --Today we will proceed with IV fluids, IV pain medication, IV nausea medication and potassium supplementation --RTC in 2 weeks to reassess  #Swelling/Pain in Right Arm -- Patient is having pain and swelling in the right arm at the site of the prior PICC. -- Will order ultrasound of the upper extremity to rule out superficial or deep clot.  #Neutropenia--resolved --Due to critical levels with ANC 300, gave one dose of Zarzio 300 mcg in clinic  --Defered treatment by one week and plan to give Udenyca on Day 3 starting with Cycle 2.  --Neutropenic precautions given to patient including monitoring for fevers.   Elevated LFTs: --ALT 17, AST 20. --Etiology includes chemotherapy induced, liver metastases versus increased acetaminophen use. --Monitor for now and advised to take 1 tablet of Vicoprofen every 6 hours as prescribed.  Hypokalemia: --Potassium level is 2.8, will provide IV treatment with her fluids today. --Likely secondary to vomiting/diarrhea --continue oral  Potassium Chloride 40 mEq daily   Nausea/Vomiting: --Secondary to chemotherapy -- Currently on zofran 8mg  q8H PRN for nausea, zyprexa nightly PRN for nausea. Encouraged patient to take zofran q 8 hours rather than once.  -- patient using scopolamine patch as well.  -- Has allergy to compazine, but tolerated IV phernegan. Will prescribe phernergan 25mg  PO.   #Epigastric/RUQ/back pain: --Likely secondary to underlying malignancy --Tramadol was ineffective.  --Current pain regimen includes MS Contin 15 mg q 12 hours and Norco 5-325 mg for breakthrough pain. Pain is not well controlled so we will increase dose of MS contin to 30 mg q 12 hours and Norco 10-325 mg. Advised not to double or triple dose of Norco without provider instruction.   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 --Sent referral to [facility] nutrition team  #Family history of breast cancer: --Due to strong family history of cancer and has undergone genetic testing in the past. With the anticipated diagnosis of pancreatic cancer, we will make a referral to genetics to see if additional testing is required.   #Supportive Care -- chemotherapy education  to be scheduled  -- port placement complete  -- EMLA cream for port   No orders of the defined types were placed in this encounter.   All questions were answered. The patient knows to call the clinic with any problems, questions or concerns.  I have spent a total of 30 minutes minutes of face-to-face and non-face-to-face time, preparing to see the patient,  performing a medically appropriate examination, counseling and educating the patient, documenting clinical information in the electronic health record,  and care coordination.   Ulysees Barns, MD Department of Hematology/Oncology [facility] Phone: [phone] Pager: [phone] Email: [email or web address]

## 2022-05-19 NOTE — Patient Instructions (Signed)
Hypokalemia Hypokalemia means that the amount of potassium in the blood is lower than normal. Potassium is a mineral (electrolyte) that helps regulate the amount of fluid in the body. It also stimulates muscle tightening (contraction) and helps nerves work properly. Normally, most of the body's potassium is inside cells, and only a very small amount is in the blood. Because the amount in the blood is so small, minor changes to potassium levels in the blood can be life-threatening. What are the causes? This condition may be caused by: Antibiotic medicine. Diarrhea or vomiting. Taking too much of a medicine that helps you have a bowel movement (laxative) can cause diarrhea and lead to hypokalemia. Chronic kidney disease (CKD). Medicines that help the body get rid of excess fluid (diuretics). Eating disorders, such as anorexia or bulimia. Low magnesium levels in the body. Sweating a lot. What are the signs or symptoms? Symptoms of this condition include: Weakness. Constipation. Fatigue. Muscle cramps. Mental confusion. Skipped heartbeats or irregular heartbeat (palpitations). Tingling or numbness. How is this diagnosed? This condition is diagnosed with a blood test. How is this treated? This condition may be treated by: Taking potassium supplements. Adjusting the medicines that you take. Eating more foods that contain a lot of potassium. If your potassium level is very low, you may need to get potassium through an IV and be monitored in the hospital. Follow these instructions at home: Eating and drinking  Eat a healthy diet. A healthy diet includes fresh fruits and vegetables, whole grains, healthy fats, and lean proteins. If told, eat more foods that contain a lot of potassium. These include: Nuts, such as peanuts and pistachios. Seeds, such as sunflower seeds and pumpkin seeds. Peas, lentils, and lima beans. Whole grain and bran cereals and breads. Fresh fruits and vegetables,  such as apricots, avocado, bananas, cantaloupe, kiwi, oranges, tomatoes, asparagus, and potatoes. Juices, such as orange, tomato, and prune. Lean meats, including fish. Milk and milk products, such as yogurt. General instructions Take over-the-counter and prescription medicines only as told by your health care provider. This includes vitamins, natural food products, and supplements. Keep all follow-up visits. This is important. Contact a health care provider if: You have weakness that gets worse. You feel your heart pounding or racing. You vomit. You have diarrhea. You have diabetes and you have trouble keeping your blood sugar in your target range. Get help right away if: You have chest pain. You have shortness of breath. You have vomiting or diarrhea that lasts for more than 2 days. You faint. These symptoms may be an emergency. Get help right away. Call 911. Do not wait to see if the symptoms will go away. Do not drive yourself to the hospital. Summary Hypokalemia means that the amount of potassium in the blood is lower than normal. This condition is diagnosed with a blood test. Hypokalemia may be treated by taking potassium supplements, adjusting the medicines that you take, or eating more foods that are high in potassium. If your potassium level is very low, you may need to get potassium through an IV and be monitored in the hospital. This information is not intended to replace advice given to you by your health care provider. Make sure you discuss any questions you have with your health care provider. Document Revised: 10/08/2020 Document Reviewed: 10/08/2020 Elsevier Patient Education  2023 [employer]  

## 2022-05-21 ENCOUNTER — Inpatient Hospital Stay: Payer: BC Managed Care – PPO

## 2022-05-22 ENCOUNTER — Other Ambulatory Visit: Payer: Self-pay

## 2022-05-24 ENCOUNTER — Other Ambulatory Visit: Payer: Self-pay | Admitting: Hematology and Oncology

## 2022-05-24 DIAGNOSIS — M79601 Pain in right arm: Secondary | ICD-10-CM

## 2022-05-26 ENCOUNTER — Telehealth: Payer: Self-pay | Admitting: *Deleted

## 2022-05-26 ENCOUNTER — Other Ambulatory Visit: Payer: Self-pay | Admitting: *Deleted

## 2022-05-26 ENCOUNTER — Ambulatory Visit (HOSPITAL_COMMUNITY)
Admission: RE | Admit: 2022-05-26 | Discharge: 2022-05-26 | Disposition: A | Discharge status: Home / Self Care | Payer: BC Managed Care – PPO | Source: Ambulatory Visit | Attending: Hematology and Oncology | Admitting: Hematology and Oncology

## 2022-05-26 DIAGNOSIS — I82629 Acute embolism and thrombosis of deep veins of unspecified upper extremity: Secondary | ICD-10-CM | POA: Insufficient documentation

## 2022-05-26 DIAGNOSIS — M79601 Pain in right arm: Secondary | ICD-10-CM

## 2022-05-26 MED ORDER — APIXABAN (ELIQUIS) VTE STARTER PACK (10MG AND 5MG): ORAL_TABLET | ORAL | 0 refills | Status: DC

## 2022-05-26 NOTE — Telephone Encounter (Signed)
Spoke with patient after she had her US done of her right arm.  She was in the lobby with her husband.  Per Dr. Leonides Schanz, advised that pt has a DVT in her right arm s/p PICC line. Pt has a port at this time. Advised patient that Dr. Leonides Schanz recommends she start Eliquis for the DVT. Pt voices understanding. She has been on this before. Starter pack has been sent to her pharmacy. Once that is completed she will be on maintenance 5 mg daily. Instructed on Bleeding precautions. Again pt voiced understanding.

## 2022-05-26 NOTE — Progress Notes (Signed)
VASCULAR LAB    Right upper extremity venous duplex has been performed.  See CV proc for preliminary results.  Called results to Dr. Sharman Crate, [facility], RVT 05/26/2022, 11:53 AM

## 2022-05-30 DIAGNOSIS — Z1231 Encounter for screening mammogram for malignant neoplasm of breast: Secondary | ICD-10-CM | POA: Diagnosis not present

## 2022-05-30 LAB — HM MAMMOGRAPHY

## 2022-05-31 MED FILL — Fosaprepitant Dimeglumine For IV Infusion 150 MG (Base Eq): INTRAVENOUS | Qty: 5 | Status: AC

## 2022-05-31 MED FILL — Dexamethasone Sodium Phosphate Inj 100 MG/10ML: INTRAMUSCULAR | Qty: 1 | Status: AC

## 2022-05-31 NOTE — Progress Notes (Unsigned)
[facility] [phone]   [phone]  PROGRESS NOTE:  Patient Care Team: Angelica Chessman, MD as PCP - General (Family Medicine) Reece Packer, MD as Consulting Physician (Medical Oncology)  CHIEF COMPLAINTS/PURPOSE OF CONSULTATION:  Metastatic pancreatic adenocarcinoma involving the liver  ONCOLOGIC HISTORY: Presented with nausea and right upper quadrant pain 03/08/2022: Abdominal US showed multiple hypoechoic masses within the liver are indeterminate.  03/10/2022: MR abdomen: Hypoenhancing mass compatible with pancreatic head adenocarcinoma with numerous targetoid enhancing masses in all segments of the liver, compatible with metastatic disease. The mass severely effaces and nearly occludes the portal vein and splenic vein, and occludes the superior mesenteric vein with some collaterals noted. The mass substantially abuts the proximal transverse duodenum, although overt duodenal invasion is indeterminate. 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 Prominent stool throughout the colon favors constipation. Fluid-fluid level in the gallbladder likely from sludge. 03/14/2022: Establish care with [facility] Hematology/Oncology 03/17/2022: CT chest: no definitive evidence of lung metastases.  03/22/2022: Liver biopsy confirmed metastatic adenocarcinoma, pancreatic primary.  03/30/2022: Cycle 1, Day 1 of mFOLFIRINOX 04/14/2022: Cycle 2, Day 1 of mFOLFIRINOX HELD due to ANC 300.  04/20/2022: Cycle 2, Day 1 of mFOLFIRINOX 05/04/2022: Cycle 3, Day 1 of mFOLFIRINOX 05/19/2022: Cycle 4, Day 1 of mFOLFIRINOX HELD per patient request.    HISTORY OF PRESENTING ILLNESS:  Kaitlin Ferrell presents today for a follow up before Cycle 4, Day 1 of mFOLFIRINOX.   Kaitlin Ferrell reports ***.  She denies fevers, chills, sweats, shortness of breath, chest pain, cough, peripheral neuropathy or cold sensitivity. She has no other complaints. Rest of the 10 point ROS is below.   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   treated with 4 cycles of actinomycin D from 12-02-11 thru 01-13-12   H/O molar pregnancy, antepartum    Hypertension    Migraine    Miscarriage     SURGICAL HISTORY: Past Surgical History:  Procedure Laterality Date   BREAST BIOPSY  2001   Left   BREAST LUMPECTOMY     DILATION AND CURETTAGE OF UTERUS  1992, 09/2011   DILATION AND EVACUATION  2011   INSERTION OF MESH N/A 09/03/2013   Procedure: INSERTION OF MESH;  Surgeon: Ardeth Sportsman, MD;  Location: [facility];  Service: General;  Laterality: N/A;   IR CHEST FLUORO  04/14/2022   IR IMAGING GUIDED PORT INSERTION  03/22/2022   IR IMAGING GUIDED PORT INSERTION  05/02/2022   IR PORT REPAIR CENTRAL VENOUS ACCESS DEVICE  04/19/2022   IR REMOVAL TUN CV CATH W/O FL  05/02/2022   IR US LIVER BIOPSY  03/22/2022   PORTACATH PLACEMENT  11/2011   PORTACATH PLACEMENT     REMOVAL  MARCH 2014   VENTRAL HERNIA REPAIR N/A 09/03/2013   Procedure: LAPAROSCOPIC VENTRAL WALL HERNIA REPAIR;  Surgeon: Ardeth Sportsman, MD;  Location: [facility];  Service: General;  Laterality: N/A;   WISDOM TOOTH EXTRACTION     in 11th  grade    SOCIAL HISTORY: Social History   Socioeconomic History   Marital status: Married    Spouse name: [name]   Number of children: 2   Years of education: college   Highest education level: Not on file  Occupational History    Comment: [employer]  Tobacco Use    Smoking status: Never   Smokeless tobacco: Never  Substance and Sexual Activity   Alcohol use: Yes    Alcohol/week: 1.0 standard drink of alcohol    Types: 1 Glasses of wine per week    Comment: occas   Drug use: No   Sexual activity: Not Currently    Birth control/protection: Pill  Other Topics Concern   Not on file  Social History Narrative   Patient is married [name]). Patient works part time at [employer].   Right handed.   Patient has her CMA.   Caffeine- None         Social Determinants of Health   Financial Resource Strain: Not on file  Food Insecurity: No Food Insecurity (05/10/2022)   Hunger Vital Sign    Worried About Running Out of Food in the Last Year: Never true    Ran Out of Food in the Last Year: Never true  Transportation Needs: No Transportation Needs (05/10/2022)   PRAPARE - Administrator, Civil Service (Medical): No    Lack of Transportation (Non-Medical): No  Physical Activity: Not on file  Stress: Not on file  Social Connections: Not on file  Intimate Partner Violence: Not At Risk (05/10/2022)   Humiliation, Afraid, Rape, and Kick questionnaire    Fear of Current or Ex-Partner: No    Emotionally Abused: No    Physically Abused: No    Sexually Abused: No    FAMILY HISTORY: Family History  Problem Relation Age of Onset   Breast cancer Mother        dx > 50   Heart attack Mother    Diabetes Father    Breast cancer Maternal Aunt        dx > 50   Breast cancer Maternal Aunt        dx > 50   Breast cancer Maternal Grandmother        ? < 50   Stroke Paternal Grandmother    Alcoholism Other     ALLERGIES:  is allergic to compazine [prochlorperazine edisylate], propofol, oxycodone-acetaminophen, prochlorperazine, and labetalol.  MEDICATIONS:  Current Outpatient Medications  Medication Sig Dispense Refill   amLODipine (NORVASC) 10 MG tablet Take 1 tablet by mouth daily.     APIXABAN (ELIQUIS) VTE STARTER PACK (10MG  AND 5MG ) Take as  directed on package: start with two-5mg  tablets twice daily for 7 days. On day 8, switch to one-5mg  tablet twice daily. 1 each 0   Cholecalciferol 1.25 MG (50000 UT) capsule Take 50,000 Units by mouth once a week. (Patient not taking: Reported on 05/09/2022)     citalopram (CELEXA) 10 MG tablet Take by mouth. (Patient not taking: Reported on 05/09/2022)     diphenoxylate-atropine (LOMOTIL) 2.5-0.025 MG tablet Take 1 tablet by mouth 4 (four) times daily as needed for diarrhea or loose stools. 30 tablet 0   eletriptan (RELPAX) 40 MG tablet Take 1 tablet (40 mg total) by mouth as needed for migraine or headache. May repeat in 2 hours if needed. 15 tablet 6   hydrochlorothiazide (MICROZIDE) 12.5 MG capsule Take 12.5 mg by mouth daily as needed (swelling). (Patient not  taking: Reported on 05/19/2022)     hydrocodone-ibuprofen (VICOPROFEN) 5-200 MG tablet Take 1-2 tablets by mouth every 6 (six) hours as needed for pain. 60 tablet 0   lidocaine-prilocaine (EMLA) cream Apply 1 Application topically as needed. 30 g 0   LORazepam (ATIVAN) 0.5 MG tablet Take 0.5 mg by mouth as needed for anxiety.     magic mouthwash (multi-ingredient) oral suspension Swish and swallow 5 mLs by mouth 4 times daily as needed for throat discomfort 400 mL 1   magnesium oxide (MAG-OX) 400 (241.3 MG) MG tablet TAKE 1 TABLET BY MOUTH TWICE A DAY (Patient not taking: Reported on 05/19/2022) 60 tablet 6   morphine (MS CONTIN) 30 MG 12 hr tablet Take 1 tablet (30 mg total) by mouth every 12 (twelve) hours. 60 tablet 0   OLANZapine (ZYPREXA) 10 MG tablet TAKE 1 TABLET BY MOUTH EVERYDAY AT BEDTIME (Patient taking differently: Take 10 mg by mouth at bedtime.) 90 tablet 1   ondansetron (ZOFRAN) 8 MG tablet Take 1 tablet (8 mg total) by mouth every 8 (eight) hours as needed for nausea or vomiting. 90 tablet 0   ondansetron (ZOFRAN-ODT) 8 MG disintegrating tablet Take 1 tablet (8 mg total) by mouth every 8 (eight) hours as needed for nausea or  vomiting. 20 tablet 0   promethazine (PHENERGAN) 25 MG tablet Take 1 tablet (25 mg total) by mouth every 6 (six) hours as needed for nausea or vomiting. 30 tablet 0   scopolamine (TRANSDERM-SCOP) 1 MG/3DAYS Place 1 patch (1.5 mg total) onto the skin every 3 (three) days. 10 patch 12   No current facility-administered medications for this visit.    REVIEW OF SYSTEMS:   Constitutional: ( - ) fevers, ( - )  chills , ( - ) night sweats Eyes: ( - ) blurriness of vision, ( - ) double vision, ( - ) watery eyes Ears, nose, mouth, throat, and face: ( - ) mucositis, ( - ) sore throat Respiratory: ( - ) cough, ( - ) dyspnea, ( - ) wheezes Cardiovascular: ( - ) palpitation, ( - ) chest discomfort, ( - ) lower extremity swelling Gastrointestinal:  (+) nausea, ( - ) heartburn, ( +) change in bowel habits Skin: ( - ) abnormal skin rashes Lymphatics: ( - ) new lymphadenopathy, ( - ) easy bruising Neurological: ( - ) numbness, ( - ) tingling, ( - ) new weaknesses Behavioral/Psych: ( - ) mood change, ( - ) new changes  All other systems were reviewed with the patient and are negative.  PHYSICAL EXAMINATION: ECOG PERFORMANCE STATUS: 1 - Symptomatic but completely ambulatory  There were no vitals filed for this visit.     There were no vitals filed for this visit.     GENERAL: well appearing female in NAD  SKIN: skin color, texture, turgor are normal, no rashes or significant lesions EYES: conjunctiva are pink and non-injected, sclera clear LUNGS: clear to auscultation and percussion with normal breathing effort HEART: regular rate & rhythm and no murmurs and no lower extremity edema ABDOMEN:  normal bowel sounds. Abdomen is tender to palpation in epigastric and RUQ region. No palpable masses or hepatomegaly appreciated.  Musculoskeletal: no cyanosis of digits and no clubbing  PSYCH: alert & oriented x 3, fluent speech NEURO: no focal motor/sensory deficits  LABORATORY DATA:  I have reviewed  the data as listed    Latest Ref Rng & Units 05/19/2022    9:14 AM 05/10/2022    6:01 AM  05/09/2022   11:35 AM  CBC  WBC 4.0 - 10.5 K/uL 10.9  4.8  7.6   Hemoglobin 12.0 - 15.0 g/dL 16.1  09.6  04.5   Hematocrit 36.0 - 46.0 % 34.9  31.7  35.7   Platelets 150 - 400 K/uL 185  53  83        Latest Ref Rng & Units 05/19/2022    9:14 AM 05/10/2022    6:01 AM 05/09/2022   10:33 PM  CMP  Glucose 70 - 99 mg/dL 409  92    BUN 6 - 20 mg/dL 13  13    Creatinine 8.11 - 1.00 mg/dL 9.14  7.82    Sodium 956 - 145 mmol/L 143  138    Potassium 3.5 - 5.1 mmol/L 2.8  3.9  4.0   Chloride 98 - 111 mmol/L 102  108    CO2 22 - 32 mmol/L 33  23    Calcium 8.9 - 10.3 mg/dL 9.7  8.6    Total Protein 6.5 - 8.1 g/dL 7.5     Total Bilirubin 0.3 - 1.2 mg/dL 0.3     Alkaline Phos 38 - 126 U/L 123     AST 15 - 41 U/L 20     ALT 0 - 44 U/L 17        RADIOGRAPHIC STUDIES: I have personally reviewed the radiological images as listed and agreed with the findings in the report. VAS [place] UPPER EXTREMITY VENOUS DUPLEX  Result Date: 05/26/2022 UPPER VENOUS STUDY  Patient Name:  Kaitlin Ferrell [facility]  Date of Exam:   05/26/2022 Medical Rec #: [id number]                 Accession #:    [id number] Date of Birth: March 24, 1965                 Patient Gender: F Patient Age:   57 years Exam Location:  [facility] Procedure:      VAS [place] UPPER EXTREMITY VENOUS DUPLEX Referring Phys: Jeanie Sewer --------------------------------------------------------------------------------  Indications: Pain, Swelling, and Recent PICC line in right upper extremity Risk Factors: Stage IV Pancreatic adenocarcinoma involving the liver:. Comparison Study: No prior study on file Performing Technologist: Sherren Kerns RVS  Examination Guidelines: A complete evaluation includes B-mode imaging, spectral Doppler, color Doppler, and power Doppler as needed of all accessible portions of each vessel. Bilateral testing is considered an integral part of a  complete examination. Limited examinations for reoccurring indications may be performed as noted.  Right Findings: +----------+------------+---------+-----------+----------+-----------+ RIGHT     CompressiblePhasicitySpontaneousProperties  Summary   +----------+------------+---------+-----------+----------+-----------+ IJV           Full       Yes       Yes                          +----------+------------+---------+-----------+----------+-----------+ Subclavian    Full       Yes       Yes                          +----------+------------+---------+-----------+----------+-----------+ Axillary      None       No        No                  Acute    +----------+------------+---------+-----------+----------+-----------+ Brachial      Full  Yes       Yes                          +----------+------------+---------+-----------+----------+-----------+ Radial        Full                                              +----------+------------+---------+-----------+----------+-----------+ Ulnar         Full                                              +----------+------------+---------+-----------+----------+-----------+ Cephalic      None       No        No               acute at [facility] +----------+------------+---------+-----------+----------+-----------+ Basilic       None       No        No                  Acute    +----------+------------+---------+-----------+----------+-----------+  Left Findings: +----------+------------+---------+-----------+----------+---------------------+ LEFT      CompressiblePhasicitySpontaneousProperties       Summary        +----------+------------+---------+-----------+----------+---------------------+ Subclavian               Yes       Yes                 Cannot compress                                                          secondary to port                                                              placement       +----------+------------+---------+-----------+----------+---------------------+  Summary:  Right: Findings consistent with acute deep vein thrombosis involving the right axillary vein. Findings consistent with acute superficial vein thrombosis involving the entire right basilic vein and right cephalic vein at the [facility].  Left: No evidence of thrombosis in the subclavian.  *See table(s) above for measurements and observations.  Diagnosing physician: Heath Lark Electronically signed by Heath Lark on 05/26/2022 at 4:56:03 PM.    Final    DG Chest Port 1 View  Result Date: 05/06/2022 CLINICAL DATA:  Leaking Port-A-Cath EXAM: PORTABLE CHEST 1 VIEW COMPARISON:  04/14/2022, 05/02/2022 FINDINGS: New left-sided chest wall port is noted with catheter tip in the right atrium. Port needle appears well placed on this single view. Cardiac shadow is within normal limits. Lungs are clear bilaterally. No bony abnormality is noted. IMPRESSION: No acute abnormality is noted.  Port needle appears well placed. Electronically Signed   By: Alcide Clever M.D.   On: 05/06/2022 00:33   IR Removal Tun Cv Cath W/O FL  Result  Date: 05/03/2022 INDICATION: Metastatic pancreatic cancer, post placement image guided Port a catheter on 03/22/2022. Unfortunately, port a catheter reservoir was noted to be tilted resulting in an inability to allow for successful port reservoir access. As such, patient underwent definitive Port revision and tacking on 04/19/2022, however despite this intervention the infusion center has still not been able to successfully accessed the patient's port a catheter. As such, patient returns today to the interventional radiology department for definitive [facility] a catheter replacement. EXAM: 1. IMPLANTED PORT A CATH PLACEMENT WITH ULTRASOUND AND FLUOROSCOPIC GUIDANCE 2. PORT A CATHETER REMOVAL COMPARISON:  Port a catheter revision-04/19/2022 Image guided PICC line placement-04/20/2022 Port a catheter  placement-03/22/2022 MEDICATIONS: Ancef 2 g IV; the antibiotics were administered within an appropriate time frame prior to the initiation of the procedure. ANESTHESIA/SEDATION: Moderate (conscious) sedation was employed during this procedure as administered by the Interventional Radiology RN. A total of Versed 3 mg and Fentanyl 150 mcg was administered intravenously. Moderate Sedation Time: 51 minutes. The patient's level of consciousness and vital signs were monitored continuously by radiology nursing throughout the procedure under my direct supervision. CONTRAST:  None FLUOROSCOPY TIME:  40 seconds (4 mGy) COMPLICATIONS: None immediate. PROCEDURE: The procedure, risks, benefits, and alternatives were explained to the patient. Questions regarding the procedure were encouraged and answered. The patient understands and consents to the procedure. Given the presence of the malpositioned right internal jugular approach port a catheter, the decision was made to proceed with definitive left internal jugular approach port a catheter placement. As such, the left neck and chest were prepped with chlorhexidine in a sterile fashion, and a sterile drape was applied covering the operative field. Maximum barrier sterile technique with sterile gowns and gloves were used for the procedure. A timeout was performed prior to the initiation of the procedure. Local anesthesia was provided with 1% lidocaine with epinephrine. After creating a small venotomy incision, a micropuncture kit was utilized to access the internal jugular vein. Real-time ultrasound guidance was utilized for vascular access including the acquisition of a permanent ultrasound image documenting patency of the accessed vessel. The microwire was utilized to measure appropriate catheter length. A subcutaneous port pocket was then created along the upper chest wall utilizing a combination of sharp and blunt dissection. The pocket was irrigated with sterile saline. A  single lumen ISP sized power injectable port was chosen for placement. The 8 Fr catheter was tunneled from the port pocket site to the venotomy incision. The port was placed in the pocket. The external catheter was trimmed to appropriate length. At the venotomy, an 8 Fr peel-away sheath was placed over a guidewire under fluoroscopic guidance. The catheter was then placed through the sheath and the sheath was removed. Final catheter positioning was confirmed and documented with a fluoroscopic spot radiograph. The port was accessed with a Huber needle, aspirated and flushed with heparinized saline. The venotomy site was closed with an interrupted 4-0 Vicryl suture. The port pocket incision was closed with interrupted 2-0 Vicryl suture. The skin was opposed with a running subcuticular 4-0 Vicryl suture. Dermabond and Steri-strips were applied to both incisions. Dressings were applied. Attention was now paid towards removal of the malpositioned right-sided port a catheter. The skin overlying the operative site was prepped and draped in usual sterile fashion. After the adjacent subcutaneous tissues were anesthetized with 1% lidocaine with epinephrine, the port a Catheter was removed intact using a combination of sharp and blunt dissection. Port incision was closed with an interrupted 2-0  Vicryl suture. Dermabond and Steri-Strips were applied to the incision. Dressings were applied. The patient tolerated the above procedures well without immediate postprocedural complication. FINDINGS: After catheter placement, the left internal jugular approach port a catheter tip lies within the superior cavoatrial junction. The catheter aspirates and flushes normally and is ready for immediate use. After definitive removal of the right internal jugular approach port a catheter, the left internal jugular approach port a catheter as well as the right upper extremity approach PICC line are unchanged in position. IMPRESSION: 1. Successful  placement of a left internal jugular approach power injectable Port-A-Cath. The catheter is ready for immediate use. 2. Successful removal of right internal jugular approach port a catheter. PLAN: - The patient is scheduled for chemotherapy this coming Wednesday (05/04/2022). - If the left internal jugular approach port a catheter is successfully accessed and utilized for treatment, the patient may return to the interventional radiology department for removal of the right upper extremity approach PICC line as indicated. Electronically Signed   By: Simonne Come M.D.   On: 05/03/2022 12:33   IR IMAGING GUIDED PORT INSERTION  Result Date: 05/03/2022 INDICATION: Metastatic pancreatic cancer, post placement image guided Port a catheter on 03/22/2022. Unfortunately, port a catheter reservoir was noted to be tilted resulting in an inability to allow for successful port reservoir access. As such, patient underwent definitive Port revision and tacking on 04/19/2022, however despite this intervention the infusion center has still not been able to successfully accessed the patient's port a catheter. As such, patient returns today to the interventional radiology department for definitive [facility] a catheter replacement. EXAM: 1. IMPLANTED PORT A CATH PLACEMENT WITH ULTRASOUND AND FLUOROSCOPIC GUIDANCE 2. PORT A CATHETER REMOVAL COMPARISON:  Port a catheter revision-04/19/2022 Image guided PICC line placement-04/20/2022 Port a catheter placement-03/22/2022 MEDICATIONS: Ancef 2 g IV; the antibiotics were administered within an appropriate time frame prior to the initiation of the procedure. ANESTHESIA/SEDATION: Moderate (conscious) sedation was employed during this procedure as administered by the Interventional Radiology RN. A total of Versed 3 mg and Fentanyl 150 mcg was administered intravenously. Moderate Sedation Time: 51 minutes. The patient's level of consciousness and vital signs were monitored continuously by radiology  nursing throughout the procedure under my direct supervision. CONTRAST:  None FLUOROSCOPY TIME:  40 seconds (4 mGy) COMPLICATIONS: None immediate. PROCEDURE: The procedure, risks, benefits, and alternatives were explained to the patient. Questions regarding the procedure were encouraged and answered. The patient understands and consents to the procedure. Given the presence of the malpositioned right internal jugular approach port a catheter, the decision was made to proceed with definitive left internal jugular approach port a catheter placement. As such, the left neck and chest were prepped with chlorhexidine in a sterile fashion, and a sterile drape was applied covering the operative field. Maximum barrier sterile technique with sterile gowns and gloves were used for the procedure. A timeout was performed prior to the initiation of the procedure. Local anesthesia was provided with 1% lidocaine with epinephrine. After creating a small venotomy incision, a micropuncture kit was utilized to access the internal jugular vein. Real-time ultrasound guidance was utilized for vascular access including the acquisition of a permanent ultrasound image documenting patency of the accessed vessel. The microwire was utilized to measure appropriate catheter length. A subcutaneous port pocket was then created along the upper chest wall utilizing a combination of sharp and blunt dissection. The pocket was irrigated with sterile saline. A single lumen ISP sized power injectable port was chosen  for placement. The 8 Fr catheter was tunneled from the port pocket site to the venotomy incision. The port was placed in the pocket. The external catheter was trimmed to appropriate length. At the venotomy, an 8 Fr peel-away sheath was placed over a guidewire under fluoroscopic guidance. The catheter was then placed through the sheath and the sheath was removed. Final catheter positioning was confirmed and documented with a fluoroscopic spot  radiograph. The port was accessed with a Huber needle, aspirated and flushed with heparinized saline. The venotomy site was closed with an interrupted 4-0 Vicryl suture. The port pocket incision was closed with interrupted 2-0 Vicryl suture. The skin was opposed with a running subcuticular 4-0 Vicryl suture. Dermabond and Steri-strips were applied to both incisions. Dressings were applied. Attention was now paid towards removal of the malpositioned right-sided port a catheter. The skin overlying the operative site was prepped and draped in usual sterile fashion. After the adjacent subcutaneous tissues were anesthetized with 1% lidocaine with epinephrine, the port a Catheter was removed intact using a combination of sharp and blunt dissection. Port incision was closed with an interrupted 2-0 Vicryl suture. Dermabond and Steri-Strips were applied to the incision. Dressings were applied. The patient tolerated the above procedures well without immediate postprocedural complication. FINDINGS: After catheter placement, the left internal jugular approach port a catheter tip lies within the superior cavoatrial junction. The catheter aspirates and flushes normally and is ready for immediate use. After definitive removal of the right internal jugular approach port a catheter, the left internal jugular approach port a catheter as well as the right upper extremity approach PICC line are unchanged in position. IMPRESSION: 1. Successful placement of a left internal jugular approach power injectable Port-A-Cath. The catheter is ready for immediate use. 2. Successful removal of right internal jugular approach port a catheter. PLAN: - The patient is scheduled for chemotherapy this coming Wednesday (05/04/2022). - If the left internal jugular approach port a catheter is successfully accessed and utilized for treatment, the patient may return to the interventional radiology department for removal of the right upper extremity approach  PICC line as indicated. Electronically Signed   By: Simonne Come M.D.   On: 05/03/2022 12:33    ASSESSMENT & PLAN Kaitlin Ferrell is a 57 y.o. female who returns to the clinic for newly diagnosed pancreatic adenocarcinoma.    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 Dr. Leonides Schanz recommends chemotherapy regimen with FOLFIRINOX q 2 weeks.  --Plan to obtain restaging CT scans every 3 months to assess treatment response.  --[facility] Testing: MS-stable, 2 Muts/Mb, ATMH627fs*31, KRASG12L, MDM2 amplification, CDKN2A/B. No targetable mutations.  --Started mFOLFIRINOX on 03/30/2022 PLAN: --Due to start Cycle 4, Day 1 of FOLFIRINOX today, will HOLD per patient request.  --Labs from today reviewed with patient.  White blood cell *** --Discussed options with patient moving forward.  We could consider either dose reducing the FOLFIRINOX, transitioning to gem/paclitaxel, or discontinuing chemotherapy altogether.  At this time I would favor continuing treatment with a modified or different regimen. --Today we will proceed with IV fluids, IV pain medication, IV nausea medication and potassium supplementation --RTC in 2 weeks to reassess  #Swelling/Pain in Right Arm -- Patient is having pain and swelling in the right arm at the site of the prior PICC. -- Will order  ultrasound of the upper extremity to rule out superficial or deep clot.  #Neutropenia--resolved --Due to critical levels with ANC 300, gave one dose of Zarzio 300 mcg in clinic  --Defered treatment by one week and plan to give Udenyca on Day 3 starting with Cycle 2.  --Neutropenic precautions given to patient including monitoring for fevers.   Elevated LFTs: --ALT *** --Etiology includes  chemotherapy induced, liver metastases versus increased acetaminophen use. --Monitor for now and advised to take 1 tablet of Vicoprofen every 6 hours as prescribed.  Hypokalemia: --Potassium level is ***, will provide IV treatment with her fluids today. --Likely secondary to vomiting/diarrhea --continue oral Potassium Chloride 40 mEq daily   Nausea/Vomiting: --Secondary to chemotherapy -- Currently on zofran 8mg  q8H PRN for nausea, zyprexa nightly PRN for nausea. Encouraged patient to take zofran q 8 hours rather than once.  -- patient using scopolamine patch as well.  -- Has allergy to compazine, but tolerated IV phernegan. Will prescribe phernergan 25mg  PO.   #Epigastric/RUQ/back pain: --Likely secondary to underlying malignancy --Tramadol was ineffective.  --Current pain regimen includes MS Contin 15 mg q 12 hours and Norco 5-325 mg for breakthrough pain. Pain is not well controlled so we will increase dose of MS contin to 30 mg q 12 hours and Norco 10-325 mg. Advised not to double or triple dose of Norco without provider instruction.   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 --Sent referral to [facility] nutrition team  #Family history of breast cancer: --Due to strong family history of cancer and has undergone genetic testing in the past. With the anticipated diagnosis of pancreatic cancer, we will make a referral to genetics to see if additional testing is required.   #Supportive Care -- chemotherapy education to be scheduled  -- port placement complete  -- EMLA cream for port   No orders of the defined types were placed in this encounter.   All questions were answered. The patient knows to call the clinic with any problems, questions or concerns.  I have spent a total of 30 minutes minutes of face-to-face  and non-face-to-face time, preparing to see the patient,  performing a medically appropriate examination, counseling and educating the patient, documenting clinical information in the electronic health record,  and care coordination.   Ulysees Barns, MD Department of Hematology/Oncology [facility] Phone: [phone] Pager: [phone] Email: [email or web address]

## 2022-06-01 ENCOUNTER — Telehealth: Payer: Self-pay | Admitting: *Deleted

## 2022-06-01 ENCOUNTER — Inpatient Hospital Stay: Payer: BC Managed Care – PPO

## 2022-06-01 ENCOUNTER — Telehealth: Payer: Self-pay | Admitting: Hematology and Oncology

## 2022-06-01 ENCOUNTER — Inpatient Hospital Stay: Payer: BC Managed Care – PPO | Admitting: Hematology and Oncology

## 2022-06-01 ENCOUNTER — Inpatient Hospital Stay: Payer: BC Managed Care – PPO | Admitting: Dietician

## 2022-06-01 DIAGNOSIS — R112 Nausea with vomiting, unspecified: Secondary | ICD-10-CM

## 2022-06-01 DIAGNOSIS — Z95828 Presence of other vascular implants and grafts: Secondary | ICD-10-CM

## 2022-06-01 DIAGNOSIS — C259 Malignant neoplasm of pancreas, unspecified: Secondary | ICD-10-CM

## 2022-06-01 NOTE — Telephone Encounter (Signed)
TCT patient regarding appts for today. Spoke with her. She still states she wants to hold her chemo for now. She was not aware of her labd and MD appts for today. She states she will come back on 06/15/22 for port flush/labs and Elson Clan, [place] but is very sure she does not want chemo that day either.  Chemo appts and f/u injection appts have been cancelled.  Dr. Leonides Schanz made aware.

## 2022-06-02 ENCOUNTER — Encounter: Payer: Self-pay | Admitting: Hematology and Oncology

## 2022-06-03 ENCOUNTER — Inpatient Hospital Stay: Payer: BC Managed Care – PPO

## 2022-06-14 ENCOUNTER — Other Ambulatory Visit: Payer: Self-pay | Admitting: Physician Assistant

## 2022-06-14 DIAGNOSIS — C259 Malignant neoplasm of pancreas, unspecified: Secondary | ICD-10-CM

## 2022-06-15 ENCOUNTER — Ambulatory Visit: Payer: BC Managed Care – PPO

## 2022-06-15 ENCOUNTER — Inpatient Hospital Stay: Payer: BC Managed Care – PPO | Attending: Physician Assistant | Admitting: Physician Assistant

## 2022-06-15 ENCOUNTER — Inpatient Hospital Stay: Payer: BC Managed Care – PPO

## 2022-06-15 VITALS — BP 128/87 | HR 96 | Temp 97.0°F | Resp 15 | Wt 137.9 lb

## 2022-06-15 DIAGNOSIS — R110 Nausea: Secondary | ICD-10-CM | POA: Diagnosis not present

## 2022-06-15 DIAGNOSIS — Z7189 Other specified counseling: Secondary | ICD-10-CM | POA: Diagnosis not present

## 2022-06-15 DIAGNOSIS — C250 Malignant neoplasm of head of pancreas: Secondary | ICD-10-CM | POA: Diagnosis not present

## 2022-06-15 DIAGNOSIS — I82621 Acute embolism and thrombosis of deep veins of right upper extremity: Secondary | ICD-10-CM | POA: Insufficient documentation

## 2022-06-15 DIAGNOSIS — C259 Malignant neoplasm of pancreas, unspecified: Secondary | ICD-10-CM

## 2022-06-15 DIAGNOSIS — C787 Secondary malignant neoplasm of liver and intrahepatic bile duct: Secondary | ICD-10-CM | POA: Diagnosis not present

## 2022-06-15 DIAGNOSIS — Z7901 Long term (current) use of anticoagulants: Secondary | ICD-10-CM | POA: Diagnosis not present

## 2022-06-15 DIAGNOSIS — I81 Portal vein thrombosis: Secondary | ICD-10-CM | POA: Insufficient documentation

## 2022-06-15 DIAGNOSIS — Z452 Encounter for adjustment and management of vascular access device: Secondary | ICD-10-CM | POA: Insufficient documentation

## 2022-06-15 DIAGNOSIS — Z95828 Presence of other vascular implants and grafts: Secondary | ICD-10-CM

## 2022-06-15 LAB — CBC WITH DIFFERENTIAL (CANCER CENTER ONLY)
Abs Immature Granulocytes: 0.01 10*3/uL (ref 0.00–0.07)
Basophils Absolute: 0 10*3/uL (ref 0.0–0.1)
Basophils Relative: 1 %
Eosinophils Absolute: 0.1 10*3/uL (ref 0.0–0.5)
Eosinophils Relative: 2 %
HCT: 31.6 % — ABNORMAL LOW (ref 36.0–46.0)
Hemoglobin: 10.3 g/dL — ABNORMAL LOW (ref 12.0–15.0)
Immature Granulocytes: 0 %
Lymphocytes Relative: 29 %
Lymphs Abs: 1.3 10*3/uL (ref 0.7–4.0)
MCH: 28.9 pg (ref 26.0–34.0)
MCHC: 32.6 g/dL (ref 30.0–36.0)
MCV: 88.5 fL (ref 80.0–100.0)
Monocytes Absolute: 0.5 10*3/uL (ref 0.1–1.0)
Monocytes Relative: 11 %
Neutro Abs: 2.6 10*3/uL (ref 1.7–7.7)
Neutrophils Relative %: 57 %
Platelet Count: 224 10*3/uL (ref 150–400)
RBC: 3.57 MIL/uL — ABNORMAL LOW (ref 3.87–5.11)
RDW: 17.2 % — ABNORMAL HIGH (ref 11.5–15.5)
WBC Count: 4.5 10*3/uL (ref 4.0–10.5)
nRBC: 0 % (ref 0.0–0.2)

## 2022-06-15 LAB — CMP (CANCER CENTER ONLY)
ALT: 30 U/L (ref 0–44)
AST: 43 U/L — ABNORMAL HIGH (ref 15–41)
Albumin: 4 g/dL (ref 3.5–5.0)
Alkaline Phosphatase: 105 U/L (ref 38–126)
Anion gap: 4 — ABNORMAL LOW (ref 5–15)
BUN: 9 mg/dL (ref 6–20)
CO2: 29 mmol/L (ref 22–32)
Calcium: 9.4 mg/dL (ref 8.9–10.3)
Chloride: 105 mmol/L (ref 98–111)
Creatinine: 0.53 mg/dL (ref 0.44–1.00)
GFR, Estimated: >60 mL/min (ref >60)
Glucose, Bld: 136 mg/dL — ABNORMAL HIGH (ref 70–99)
Potassium: 3.9 mmol/L (ref 3.5–5.1)
Sodium: 138 mmol/L (ref 135–145)
Total Bilirubin: 0.4 mg/dL (ref 0.3–1.2)
Total Protein: 7.3 g/dL (ref 6.5–8.1)

## 2022-06-15 MED ORDER — HEPARIN SOD (PORK) LOCK FLUSH 100 UNIT/ML IV SOLN
500.0000 [IU] | Freq: Once | INTRAVENOUS | Status: AC
  Administered 2022-06-15: 500 [IU]

## 2022-06-15 MED ORDER — SODIUM CHLORIDE 0.9% FLUSH
10.0000 mL | Freq: Once | INTRAVENOUS | Status: AC
  Administered 2022-06-15: 10 mL

## 2022-06-15 NOTE — Progress Notes (Signed)
[facility] Telephone:([phone]   Fax:([phone]  PROGRESS NOTE:  Patient Care Team: Angelica Chessman, MD as PCP - General (Family Medicine) Reece Packer, MD as Consulting Physician (Medical Oncology)  CHIEF COMPLAINTS/PURPOSE OF CONSULTATION:  Metastatic pancreatic adenocarcinoma involving the liver  ONCOLOGIC HISTORY: Presented with nausea and right upper quadrant pain 03/08/2022: Abdominal US showed multiple hypoechoic masses within the liver are indeterminate.  03/10/2022: MR abdomen: Hypoenhancing mass compatible with pancreatic head adenocarcinoma with numerous targetoid enhancing masses in all segments of the liver, compatible with metastatic disease. The mass severely effaces and nearly occludes the portal vein and splenic vein, and occludes the superior mesenteric vein with some collaterals noted. The mass substantially abuts the proximal transverse duodenum, although overt duodenal invasion is indeterminate. 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 Prominent stool throughout the colon favors constipation. Fluid-fluid level in the gallbladder likely from sludge. 03/14/2022: Establish care with [facility] Hematology/Oncology 03/17/2022: CT chest: no definitive evidence of lung metastases.  03/22/2022: Liver biopsy confirmed metastatic adenocarcinoma, pancreatic primary.  03/30/2022: Cycle 1, Day 1 of mFOLFIRINOX 04/14/2022: Cycle 2, Day 1 of mFOLFIRINOX HELD due to ANC 300.  04/20/2022: Cycle 2, Day 1 of mFOLFIRINOX 05/04/2022: Cycle 3, Day 1 of mFOLFIRINOX 05/19/2022: Cycle 4, Day 1 of mFOLFIRINOX HELD per patient request.    HISTORY OF PRESENTING ILLNESS:  Kaitlin Ferrell presents today for a follow up. She was last seen by Dr. Leonides Schanz on 05/19/2022. In the interim, she has continued on a treatment break.   Ms. Meanor reports she has greatly improved with the treatment break. Her energy and appetite have improved. She is able to do her ADLs unlike while she was on treatment. She has been able to maintain her weight since the last visit. Her nausea has resolved without vomiting. She still has epigastric and RUQ pain that is managed with MS contin. She denies easy bruising or signs of bleeding while on Eliquis. She does still have lingering cold sensitivity which is improving over time. She denies any residual neuropathy. She denies fevers, chills, sweats, shortness of breath, chest pain or cough. She has no other complaints. Rest of the 10 point ROS is below.   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   treated with 4 cycles of actinomycin D from 12-02-11 thru 01-13-12   H/O molar pregnancy, antepartum    Hypertension    Migraine    Miscarriage     SURGICAL HISTORY: Past Surgical History:  Procedure Laterality Date   BREAST BIOPSY  2001   Left   BREAST LUMPECTOMY     DILATION AND CURETTAGE OF UTERUS  1992, 09/2011   DILATION AND EVACUATION  2011   INSERTION OF MESH N/A 09/03/2013   Procedure: INSERTION OF MESH;  Surgeon: Ardeth Sportsman, MD;  Location: [facility];  Service: General;  Laterality: N/A;   IR CHEST FLUORO  04/14/2022   IR IMAGING GUIDED PORT INSERTION  03/22/2022   IR IMAGING GUIDED PORT  INSERTION  05/02/2022   IR PORT REPAIR CENTRAL VENOUS ACCESS DEVICE  04/19/2022   IR REMOVAL TUN CV CATH W/O FL  05/02/2022   IR US LIVER BIOPSY  03/22/2022   PORTACATH PLACEMENT  11/2011   PORTACATH PLACEMENT      REMOVAL  MARCH 2014   VENTRAL HERNIA REPAIR N/A 09/03/2013   Procedure: LAPAROSCOPIC VENTRAL WALL HERNIA REPAIR;  Surgeon: Ardeth Sportsman, MD;  Location: [facility];  Service: General;  Laterality: N/A;   WISDOM TOOTH EXTRACTION     in 11th grade    SOCIAL HISTORY: Social History   Socioeconomic History   Marital status: Married    Spouse name: [name]   Number of children: 2   Years of education: college   Highest education level: Not on file  Occupational History    Comment: C[employer]  Tobacco Use   Smoking status: Never   Smokeless tobacco: Never  Substance and Sexual Activity   Alcohol use: Yes    Alcohol/week: 1.0 standard drink of alcohol    Types: 1 Glasses of wine per week    Comment: occas   Drug use: No   Sexual activity: Not Currently    Birth control/protection: Pill  Other Topics Concern   Not on file  Social History Narrative   Patient is married [name]). Patient works part time at [employer].   Right handed.   Patient has her CMA.   Caffeine- None         Social Determinants of Health   Financial Resource Strain: Not on file  Food Insecurity: No Food Insecurity (05/10/2022)   Hunger Vital Sign    Worried About Running Out of Food in the Last Year: Never true    Ran Out of Food in the Last Year: Never true  Transportation Needs: No Transportation Needs (05/10/2022)   PRAPARE - Administrator, Civil Service (Medical): No    Lack of Transportation (Non-Medical): No  Physical Activity: Not on file  Stress: Not on file  Social Connections: Not on file  Intimate Partner Violence: Not At Risk (05/10/2022)   Humiliation, Afraid, Rape, and Kick questionnaire    Fear of Current or Ex-Partner: No    Emotionally Abused: No    Physically Abused: No    Sexually Abused: No    FAMILY HISTORY: Family History  Problem Relation Age of Onset   Breast cancer Mother        dx > 50   Heart attack Mother    Diabetes Father    Breast cancer Maternal Aunt         dx > 50   Breast cancer Maternal Aunt        dx > 50   Breast cancer Maternal Grandmother        ? < 50   Stroke Paternal Grandmother    Alcoholism Other     ALLERGIES:  is allergic to compazine [prochlorperazine edisylate], propofol, oxycodone-acetaminophen, prochlorperazine, and labetalol.  MEDICATIONS:  Current Outpatient Medications  Medication Sig Dispense Refill   amLODipine (NORVASC) 10 MG tablet Take 1 tablet by mouth daily.     APIXABAN (ELIQUIS) VTE STARTER PACK (10MG  AND 5MG ) Take as directed on package: start with two-5mg  tablets twice daily for 7 days. On day 8, switch to one-5mg  tablet twice daily. 1 each 0   Cholecalciferol 1.25 MG (50000 UT) capsule Take 50,000 Units by mouth once a week.     diphenoxylate-atropine (LOMOTIL) 2.5-0.025 MG  tablet Take 1 tablet by mouth 4 (four) times daily as needed for diarrhea or loose stools. 30 tablet 0   eletriptan (RELPAX) 40 MG tablet Take 1 tablet (40 mg total) by mouth as needed for migraine or headache. May repeat in 2 hours if needed. 15 tablet 6   hydrocodone-ibuprofen (VICOPROFEN) 5-200 MG tablet Take 1-2 tablets by mouth every 6 (six) hours as needed for pain. 60 tablet 0   lidocaine-prilocaine (EMLA) cream Apply 1 Application topically as needed. 30 g 0   LORazepam (ATIVAN) 0.5 MG tablet Take 0.5 mg by mouth as needed for anxiety.     magic mouthwash (multi-ingredient) oral suspension Swish and swallow 5 mLs by mouth 4 times daily as needed for throat discomfort 400 mL 1   magnesium oxide (MAG-OX) 400 (241.3 MG) MG tablet TAKE 1 TABLET BY MOUTH TWICE A DAY 60 tablet 6   morphine (MS CONTIN) 30 MG 12 hr tablet Take 1 tablet (30 mg total) by mouth every 12 (twelve) hours. 60 tablet 0   OLANZapine (ZYPREXA) 10 MG tablet TAKE 1 TABLET BY MOUTH EVERYDAY AT BEDTIME (Patient taking differently: Take 10 mg by mouth at bedtime.) 90 tablet 1   ondansetron (ZOFRAN) 8 MG tablet Take 1 tablet (8 mg total) by mouth every 8 (eight)  hours as needed for nausea or vomiting. 90 tablet 0   ondansetron (ZOFRAN-ODT) 8 MG disintegrating tablet Take 1 tablet (8 mg total) by mouth every 8 (eight) hours as needed for nausea or vomiting. 20 tablet 0   promethazine (PHENERGAN) 25 MG tablet Take 1 tablet (25 mg total) by mouth every 6 (six) hours as needed for nausea or vomiting. 30 tablet 0   scopolamine (TRANSDERM-SCOP) 1 MG/3DAYS Place 1 patch (1.5 mg total) onto the skin every 3 (three) days. 10 patch 12   citalopram (CELEXA) 10 MG tablet Take by mouth. (Patient not taking: Reported on 06/15/2022)     hydrochlorothiazide (MICROZIDE) 12.5 MG capsule Take 12.5 mg by mouth daily as needed (swelling). (Patient not taking: Reported on 06/15/2022)     No current facility-administered medications for this visit.    REVIEW OF SYSTEMS:   Constitutional: ( - ) fevers, ( - )  chills , ( - ) night sweats Eyes: ( - ) blurriness of vision, ( - ) double vision, ( - ) watery eyes Ears, nose, mouth, throat, and face: ( - ) mucositis, ( - ) sore throat Respiratory: ( - ) cough, ( - ) dyspnea, ( - ) wheezes Cardiovascular: ( - ) palpitation, ( - ) chest discomfort, ( - ) lower extremity swelling Gastrointestinal:  (-) nausea, ( - ) heartburn, ( -) change in bowel habits Skin: ( - ) abnormal skin rashes Lymphatics: ( - ) new lymphadenopathy, ( - ) easy bruising Neurological: ( - ) numbness, ( - ) tingling, ( - ) new weaknesses Behavioral/Psych: ( - ) mood change, ( - ) new changes  All other systems were reviewed with the patient and are negative.  PHYSICAL EXAMINATION: ECOG PERFORMANCE STATUS: 1 - Symptomatic but completely ambulatory  Vitals:   06/15/22 1107  BP: 128/87  Pulse: 96  Resp: 15  Temp: (!) 97 F (36.1 C)  SpO2: 99%      Filed Weights   06/15/22 1107  Weight: 137 lb 14.4 oz (62.6 kg)      GENERAL: well appearing female in NAD  SKIN: skin color, texture, turgor are normal, no rashes or significant lesions EYES:  conjunctiva are pink and non-injected, sclera clear LUNGS: clear to auscultation and percussion with normal breathing effort HEART: regular rate & rhythm and no murmurs and no lower extremity edema ABDOMEN:  normal bowel sounds. Abdomen is tender to palpation in epigastric and RUQ region. No palpable masses or hepatomegaly appreciated.  Musculoskeletal: no cyanosis of digits and no clubbing  PSYCH: alert & oriented x 3, fluent speech NEURO: no focal motor/sensory deficits  LABORATORY DATA:  I have reviewed the data as listed    Latest Ref Rng & Units 06/15/2022   10:28 AM 05/19/2022    9:14 AM 05/10/2022    6:01 AM  CBC  WBC 4.0 - 10.5 K/uL 4.5  10.9  4.8   Hemoglobin 12.0 - 15.0 g/dL 74.2  59.5  63.8   Hematocrit 36.0 - 46.0 % 31.6  34.9  31.7   Platelets 150 - 400 K/uL 224  185  53        Latest Ref Rng & Units 06/15/2022   10:28 AM 05/19/2022    9:14 AM 05/10/2022    6:01 AM  CMP  Glucose 70 - 99 mg/dL 756  433  92   BUN 6 - 20 mg/dL 9  13  13    Creatinine 0.44 - 1.00 mg/dL 2.95  1.88  4.16   Sodium 135 - 145 mmol/L 138  143  138   Potassium 3.5 - 5.1 mmol/L 3.9  2.8  3.9   Chloride 98 - 111 mmol/L 105  102  108   CO2 22 - 32 mmol/L 29  33  23   Calcium 8.9 - 10.3 mg/dL 9.4  9.7  8.6   Total Protein 6.5 - 8.1 g/dL 7.3  7.5    Total Bilirubin 0.3 - 1.2 mg/dL 0.4  0.3    Alkaline Phos 38 - 126 U/L 105  123    AST 15 - 41 U/L 43  20    ALT 0 - 44 U/L 30  17       RADIOGRAPHIC STUDIES: I have personally reviewed the radiological images as listed and agreed with the findings in the report. VAS [place] UPPER EXTREMITY VENOUS DUPLEX  Result Date: 05/26/2022 UPPER VENOUS STUDY  Patient Name:  JADIRA NYGREN [facility]  Date of Exam:   05/26/2022 Medical Rec #: [id number]                 Accession #:    [id number] Date of Birth: [birth year]                 Patient Gender: F Patient Age:   57 years Exam Location:  [facility] Procedure:      VAS [place] UPPER EXTREMITY VENOUS DUPLEX  Referring Phys: Jeanie Sewer --------------------------------------------------------------------------------  Indications: Pain, Swelling, and Recent PICC line in right upper extremity Risk Factors: Stage IV Pancreatic adenocarcinoma involving the liver:. Comparison Study: No prior study on file Performing Technologist: Sherren Kerns RVS  Examination Guidelines: A complete evaluation includes B-mode imaging, spectral Doppler, color Doppler, and power Doppler as needed of all accessible portions of each vessel. Bilateral testing is considered an integral part of a complete examination. Limited examinations for reoccurring indications may be performed as noted.  Right Findings: +----------+------------+---------+-----------+----------+-----------+ RIGHT     CompressiblePhasicitySpontaneousProperties  Summary   +----------+------------+---------+-----------+----------+-----------+ IJV           Full       Yes       Yes                          +----------+------------+---------+-----------+----------+-----------+  Subclavian    Full       Yes       Yes                          +----------+------------+---------+-----------+----------+-----------+ Axillary      None       No        No                  Acute    +----------+------------+---------+-----------+----------+-----------+ Brachial      Full       Yes       Yes                          +----------+------------+---------+-----------+----------+-----------+ Radial        Full                                              +----------+------------+---------+-----------+----------+-----------+ Ulnar         Full                                              +----------+------------+---------+-----------+----------+-----------+ Cephalic      None       No        No               acute at [facility] +----------+------------+---------+-----------+----------+-----------+ Basilic       None       No        No                   Acute    +----------+------------+---------+-----------+----------+-----------+  Left Findings: +----------+------------+---------+-----------+----------+---------------------+ LEFT      CompressiblePhasicitySpontaneousProperties       Summary        +----------+------------+---------+-----------+----------+---------------------+ Subclavian               Yes       Yes                 Cannot compress                                                          secondary to port                                                             placement       +----------+------------+---------+-----------+----------+---------------------+  Summary:  Right: Findings consistent with acute deep vein thrombosis involving the right axillary vein. Findings consistent with acute superficial vein thrombosis involving the entire right basilic vein and right cephalic vein at the [facility].  Left: No evidence of thrombosis in the subclavian.  *See table(s) above for measurements and observations.  Diagnosing physician: Heath Lark Electronically signed by Heath Lark on 05/26/2022 at 4:56:03 PM.    Final  ASSESSMENT & PLAN Eleni Hlavacek is a 57 y.o. female who returns to the clinic for newly diagnosed pancreatic adenocarcinoma.    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 Dr. Leonides Schanz recommends chemotherapy regimen with FOLFIRINOX q 2 weeks.  --Plan to obtain restaging CT scans every 3 months to assess treatment response.  --[employer] Testing: MS-stable, 2 Muts/Mb, [id number]*31, KRASG12L, MDM2 amplification, CDKN2A/B. No targetable mutations.  --Started mFOLFIRINOX on 03/30/2022 PLAN: --Last dose was Cycle 3 on 05/04/2022. Due to poor  tolerance and patient's request, she has been on a treatment break until present.  --Labs from today reviewed with patient.  White blood cell 4.5, hemoglobin 10.3, MCV 88.5, and platelets of 224.  --Re-discussed options with patient moving forward.  We could consider either dose reducing the FOLFIRINOX, transitioning to gem/abraxane, or discontinuing chemotherapy altogether.  Provided patient education handouts for gemcitabine and abraxane. --Patient would like to review  her options before making a decision --We will obtain new baseline CT CAP and see patient back in 2 weeks to finalize decision.    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  --Currently on Eliquis 5 mg BID  #Neutropenia--resolved --Due to critical levels with ANC 300, gave one dose of Zarzio 300 mcg in clinic  --Defered treatment by one week and plan to give Udenyca on Day 3 starting with Cycle 2.  --Neutropenic precautions given to patient including monitoring for fevers.   Elevated LFTs: --ALT 43, AST 30. --Etiology includes chemotherapy induced, liver metastases versus increased acetaminophen use. --Monitor for now  Nausea/Vomiting: --Secondary to chemotherapy, resolved with treatment holiday -- Continue to take scopolamine patch, zofran 8mg  q8H PRN for nausea, zyprexa nightly PRN for nausea.   #Epigastric/RUQ/back pain: --Likely secondary to underlying malignancy --Tramadol was ineffective.  --Current pain regimen includes MS contin to 30 mg q 12 hours and Norco 10-325 mg.   #Appetite loss/weight loss: --Improved with treatment holiday  #Family history of breast cancer: --Due to strong family history of cancer and has undergone genetic testing in the past.  --Underwent genetic  testing on 04/07/2022 that was negative. 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   #Supportive Care -- chemotherapy education to be scheduled  -- port placement complete  -- EMLA cream for port   Orders Placed This Encounter  Procedures   CT CHEST ABDOMEN PELVIS W CONTRAST    Standing Status:   Future    Standing Expiration Date:   06/15/2023    Order Specific Question:   If indicated for the ordered procedure, I authorize the administration of contrast media per Radiology protocol    Answer:   Yes    Order Specific Question:   Does the patient have a contrast media/X-ray dye allergy?    Answer:   No    Order Specific Question:   Is patient pregnant?    Answer:   No    Order Specific Question:   Preferred imaging location?    Answer:   [facility]    Order Specific Question:   If indicated for the ordered procedure, I authorize the  administration of oral contrast media per Radiology protocol    Answer:   Yes    All questions were answered. The patient knows to call the clinic with any problems, questions or concerns.  I have spent a total of 30 minutes minutes of face-to-face and non-face-to-face time, preparing to see the patient,  performing a medically appropriate examination, counseling and educating the patient, documenting clinical information in the electronic health record,  and care coordination.   Georga Kaufmann PA-C Dept of Hematology and Oncology [facility] Phone: [phone]

## 2022-06-16 ENCOUNTER — Telehealth: Payer: Self-pay | Admitting: Nurse Practitioner

## 2022-06-16 ENCOUNTER — Other Ambulatory Visit: Payer: Self-pay

## 2022-06-17 ENCOUNTER — Ambulatory Visit: Payer: BC Managed Care – PPO

## 2022-06-22 ENCOUNTER — Encounter: Payer: Self-pay | Admitting: Physician Assistant

## 2022-06-22 ENCOUNTER — Other Ambulatory Visit: Payer: Self-pay | Admitting: Hematology and Oncology

## 2022-06-23 ENCOUNTER — Other Ambulatory Visit: Payer: Self-pay | Admitting: *Deleted

## 2022-06-23 ENCOUNTER — Other Ambulatory Visit: Payer: BC Managed Care – PPO

## 2022-06-23 MED ORDER — APIXABAN 5 MG PO TABS: 5.0000 mg | ORAL_TABLET | Freq: Two times a day (BID) | ORAL | 2 refills | Status: DC

## 2022-06-24 ENCOUNTER — Encounter: Payer: Self-pay | Admitting: Hematology and Oncology

## 2022-06-24 NOTE — Progress Notes (Signed)
Palliative Medicine [facility]  Telephone:([phone] Fax:([phone]   Name: Kaitlin Ferrell Date: 06/24/2022 MRN: [id number]  DOB: 1965-06-16  Patient Care Team: Angelica Chessman, MD as PCP - General (Family Medicine) Reece Packer, MD as Consulting Physician (Medical Oncology)    REASON FOR CONSULTATION: Kaitlin Ferrell is a 57 y.o. 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 Other history includes gestational trophoblastic disease, insomnia, HTN, and migraines. Palliative ask to see for symptom and pain management and goals of care.    SOCIAL HISTORY:     reports that she has never smoked. She has never used smokeless tobacco. She reports current alcohol use of about 1.0 standard drink of alcohol per week. She reports that she does not use drugs.  ADVANCE DIRECTIVES:  None on file  CODE STATUS: Full Code  PAST MEDICAL HISTORY: 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   treated with 4 cycles of actinomycin D from 12-02-11 thru 01-13-12   H/O molar pregnancy, antepartum    Hypertension    Migraine    Miscarriage     PAST SURGICAL HISTORY:  Past Surgical History:  Procedure Laterality Date   BREAST BIOPSY  2001   Left   BREAST LUMPECTOMY     DILATION AND CURETTAGE OF UTERUS  1992, 09/2011   DILATION AND EVACUATION  2011   INSERTION OF MESH N/A 09/03/2013   Procedure: INSERTION OF MESH;  Surgeon: Ardeth Sportsman, MD;  Location: [facility];  Service: General;  Laterality: N/A;   IR CHEST FLUORO  04/14/2022   IR IMAGING GUIDED PORT INSERTION  03/22/2022   IR IMAGING GUIDED PORT INSERTION  05/02/2022   IR PORT REPAIR CENTRAL VENOUS ACCESS DEVICE  04/19/2022   IR REMOVAL TUN CV CATH  W/O FL  05/02/2022   IR US LIVER BIOPSY  03/22/2022   PORTACATH PLACEMENT  11/2011   PORTACATH PLACEMENT     REMOVAL  MARCH 2014   VENTRAL HERNIA REPAIR N/A 09/03/2013   Procedure: LAPAROSCOPIC VENTRAL WALL HERNIA REPAIR;  Surgeon: Ardeth Sportsman, MD;  Location: [facility];  Service: General;  Laterality: N/A;   WISDOM TOOTH EXTRACTION     in 11th grade    HEMATOLOGY/ONCOLOGY HISTORY:  Oncology History  Pancreatic adenocarcinoma (HCC)  03/24/2022 Initial Diagnosis   Pancreatic adenocarcinoma (HCC)   03/30/2022 -  Chemotherapy   Patient is on Treatment Plan : PANCREAS Modified FOLFIRINOX q14d x 4 cycles      Genetic Testing   [facility] CancerNext-Expanded Panel+RNA was Negative. 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 Report date is 04/07/2022.  The CancerNext-Expanded gene panel offered by [facility] and includes sequencing, rearrangement, and RNA analysis for the following 77 genes: AIP, ALK, APC, ATM, AXIN2, BAP1, BARD1, BLM, BMPR1A, BRCA1, BRCA2, BRIP1, CDC73, CDH1, CDK4, CDKN1B, CDKN2A, CHEK2, CTNNA1, DICER1, FANCC, FH, FLCN, GALNT12, KIF1B, LZTR1, MAX, MEN1, MET, MLH1, MSH2, MSH3, MSH6, MUTYH, NBN, NF1, NF2, NTHL1, PALB2, PHOX2B, PMS2, POT1, PRKAR1A, PTCH1, PTEN, RAD51C, RAD51D, RB1, RECQL, RET, SDHA, SDHAF2, SDHB, SDHC, SDHD, SMAD4, SMARCA4, SMARCB1, SMARCE1, STK11, SUFU, TMEM127, TP53, TSC1, TSC2, VHL and XRCC2 (sequencing and deletion/duplication); EGFR, EGLN1, HOXB13, KIT, MITF, PDGFRA, POLD1, and POLE (sequencing only); EPCAM and GREM1 (  deletion/duplication only).       ALLERGIES:  is allergic to compazine [prochlorperazine edisylate], propofol, oxycodone-acetaminophen, prochlorperazine, and labetalol.  MEDICATIONS:  Current Outpatient Medications  Medication Sig Dispense Refill   amLODipine (NORVASC) 10 MG tablet Take 1 tablet by mouth daily.     apixaban (ELIQUIS) 5 MG TABS tablet Take 1 tablet (5 mg total) by mouth 2 (two) times  daily. 60 tablet 2   Cholecalciferol 1.25 MG (50000 UT) capsule Take 50,000 Units by mouth once a week.     citalopram (CELEXA) 10 MG tablet Take by mouth. (Patient not taking: Reported on 06/15/2022)     diphenoxylate-atropine (LOMOTIL) 2.5-0.025 MG tablet Take 1 tablet by mouth 4 (four) times daily as needed for diarrhea or loose stools. 30 tablet 0   eletriptan (RELPAX) 40 MG tablet Take 1 tablet (40 mg total) by mouth as needed for migraine or headache. May repeat in 2 hours if needed. 15 tablet 6   hydrochlorothiazide (MICROZIDE) 12.5 MG capsule Take 12.5 mg by mouth daily as needed (swelling). (Patient not taking: Reported on 06/15/2022)     hydrocodone-ibuprofen (VICOPROFEN) 5-200 MG tablet Take 1-2 tablets by mouth every 6 (six) hours as needed for pain. 60 tablet 0   lidocaine-prilocaine (EMLA) cream Apply 1 Application topically as needed. 30 g 0   LORazepam (ATIVAN) 0.5 MG tablet Take 0.5 mg by mouth as needed for anxiety.     magic mouthwash (multi-ingredient) oral suspension Swish and swallow 5 mLs by mouth 4 times daily as needed for throat discomfort 400 mL 1   magnesium oxide (MAG-OX) 400 (241.3 MG) MG tablet TAKE 1 TABLET BY MOUTH TWICE A DAY 60 tablet 6   morphine (MS CONTIN) 30 MG 12 hr tablet Take 1 tablet (30 mg total) by mouth every 12 (twelve) hours. 60 tablet 0   OLANZapine (ZYPREXA) 10 MG tablet TAKE 1 TABLET BY MOUTH EVERYDAY AT BEDTIME (Patient taking differently: Take 10 mg by mouth at bedtime.) 90 tablet 1   ondansetron (ZOFRAN) 8 MG tablet Take 1 tablet (8 mg total) by mouth every 8 (eight) hours as needed for nausea or vomiting. 90 tablet 0   ondansetron (ZOFRAN-ODT) 8 MG disintegrating tablet Take 1 tablet (8 mg total) by mouth every 8 (eight) hours as needed for nausea or vomiting. 20 tablet 0   promethazine (PHENERGAN) 25 MG tablet Take 1 tablet (25 mg total) by mouth every 6 (six) hours as needed for nausea or vomiting. 30 tablet 0   scopolamine (TRANSDERM-SCOP) 1  MG/3DAYS Place 1 patch (1.5 mg total) onto the skin every 3 (three) days. 10 patch 12   No current facility-administered medications for this visit.    VITAL SIGNS: LMP 06/26/2013 Comment: low dose BCP There were no vitals filed for this visit.  Estimated body mass index is 25.22 kg/m as calculated from the following:   Height as of 05/13/22: 5\' 2"  (1.575 m).   Weight as of 06/15/22: 137 lb 14.4 oz (62.6 kg).  LABS: CBC:    Component Value Date/Time   WBC 4.5 06/15/2022 1028   WBC 4.8 05/10/2022 0601   HGB 10.3 (L) 06/15/2022 1028   HGB 11.8 02/21/2012 1205   HCT 31.6 (L) 06/15/2022 1028   HCT 36.0 02/21/2012 1205   PLT 224 06/15/2022 1028   PLT 165 02/21/2012 1205   MCV 88.5 06/15/2022 1028   MCV 87.7 02/21/2012 1205   NEUTROABS 2.6 06/15/2022 1028   NEUTROABS 3.5 02/21/2012 1205   LYMPHSABS 1.3  06/15/2022 1028   LYMPHSABS 1.6 02/21/2012 1205   MONOABS 0.5 06/15/2022 1028   MONOABS 0.3 02/21/2012 1205   EOSABS 0.1 06/15/2022 1028   EOSABS 0.2 02/21/2012 1205   BASOSABS 0.0 06/15/2022 1028   BASOSABS 0.0 02/21/2012 1205   Comprehensive Metabolic Panel:    Component Value Date/Time   NA 138 06/15/2022 1028   NA 140 02/21/2012 1205   K 3.9 06/15/2022 1028   K 3.4 (L) 02/21/2012 1205   CL 105 06/15/2022 1028   CL 111 (H) 02/21/2012 1205   CO2 29 06/15/2022 1028   CO2 20 (L) 02/21/2012 1205   BUN 9 06/15/2022 1028   BUN 11.0 02/21/2012 1205   CREATININE 0.53 06/15/2022 1028   CREATININE 0.9 02/21/2012 1205   GLUCOSE 136 (H) 06/15/2022 1028   GLUCOSE 70 02/21/2012 1205   CALCIUM 9.4 06/15/2022 1028   CALCIUM 9.2 02/21/2012 1205   AST 43 (H) 06/15/2022 1028   AST 13 02/21/2012 1205   ALT 30 06/15/2022 1028   ALT <6 Repeated and Verified 02/21/2012 1205   ALKPHOS 105 06/15/2022 1028   ALKPHOS 40 02/21/2012 1205   BILITOT 0.4 06/15/2022 1028   BILITOT 0.45 02/21/2012 1205   PROT 7.3 06/15/2022 1028   PROT 7.6 02/21/2012 1205   ALBUMIN 4.0 06/15/2022 1028    ALBUMIN 3.8 02/21/2012 1205    RADIOGRAPHIC STUDIES:   PERFORMANCE STATUS (ECOG) : 1 - Symptomatic but completely ambulatory  Review of Systems  Constitutional:  Positive for appetite change and fatigue.  Gastrointestinal:  Positive for abdominal pain.  Musculoskeletal:  Positive for arthralgias.  Unless otherwise noted, a complete review of systems is negative.  Physical Exam General: NAD Cardiovascular: regular rate and rhythm Pulmonary: clear ant fields Abdomen: soft, nontender, + bowel sounds Extremities: no edema, no joint deformities Skin: no rashes Neurological:AAO x4  IMPRESSION: This is my initial visit with Mrs. Mariann Laster. No acute distress. Her husband, [name] is present. Patient is alert and able to engage appropriately in discussions.   I introduced myself, Maygan RN, and Palliative's role in collaboration with the oncology team. Concept of Palliative Care was introduced as specialized medical care for people and their families living with serious illness.  It focuses on providing relief from the symptoms and stress of a serious illness.  The goal is to improve quality of life for both the patient and the family. Values and goals of care important to patient and family were attempted to be elicited.   Mrs. Deschaine lives in the home with her husband of more than 17 years. She has 2 children (daughter 6 yrs old and her son who is in the [employer]). 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   Patient is able to perform all ADLs independently. Currently not driving due to health. She shares recent challenges over the past 3 months causing her to take a chemo break. Denies nausea, vomiting, constipation, or diarrhea. These symptoms are now well controlled or resolved since pause of treatment.   Neoplasm related pain Mrs. Woodrow speaks to her intermittent pain. Some days are better than others. Her pain is located in abdomen area and radiates. Describes as  sharp at times, nagging, ache.   We discussed her current regimen. She is taking her MS Contin and MS IR as needed. Does not feel MS Contin does much when taking as MS IR. Education provided on differences in long-acting vs short-acting medications. Advised she should be taking her  MS Contin consistently every 12 hours as this is not an as needed medication. She verbalized understanding. She plans to start taking regularly with understanding MS IR can be used for breakthrough pain.   We will continue to closely monitor and adjust as needed.   Goals of Care  We discussed her current illness and what it means in the larger context of her on-going co-morbidities. Natural disease trajectory and expectations were discussed.  Patient and husband are realistic in their understanding of her cancer diagnosis and trajectory. She shares they have also discussed at length with her children. Patient states she chose to pursue chemotherapy break due to significance of her symptoms. She was extremely fatigue, weak, could not get out of bed, no appetite, weight loss, nausea, vomiting, and diarrhea. [name] shares she was unable to perform ADLs and husband was having to bathe her. They were concerned that she was facing end-of-life given how poor her quality of life was. Patient and husband speaks to appreciation of her improvement in quality of life over the past several weeks.   I created space and opportunity for patient and husband to discuss goals and hopes. Patient is trying to focus on healthy food options and be as active as she can. They speak to plans of watchful waiting to see what upcoming CT scan results show in terms of her cancer. She will then consider if she would like to pursue additional treatment options versus not. They are clear in their understanding what pros and cons are of pursing or not to pursue further treatment including end-of-life. [name] speaks to her quality of life is most important  over quantity. She would not want to spend last moments in a stupor suffering state. Emotional support provided.   I discussed the importance of continued conversation with family and their medical providers regarding overall plan of care and treatment options, ensuring decisions are within the context of the patients values and GOCs.  PLAN: Established therapeutic relationship. Education provided on palliative's role in collaboration with their Oncology/Radiation team. Restart MS Contin every 12 hours MS IR for breakthrough pain Senna-S for bowel regimen.  Extensive goals of care discussions. Ongoing support.  I will plan to see patient back in 1-2 weeks in collaboration to other oncology appointments.    Patient expressed understanding and was in agreement with this plan. She also understands that She can call the clinic at any time with any questions, concerns, or complaints.   Thank you for your referral and allowing Palliative to assist in Mrs. Katherine Mantle Barrientes's care.   Number and complexity of problems addressed: HIGH - 1 or more chronic illnesses with SEVERE exacerbation, progression, or side effects of treatment - advanced cancer, pain. Any controlled substances utilized were prescribed in the context of palliative care.   Visit consisted of counseling and education dealing with the complex and emotionally intense issues of symptom management and palliative care in the setting of serious and potentially life-threatening illness.Greater than 50%  of this time was spent counseling and coordinating care related to the above assessment and plan.  Signed by: Willette Alma, AGPCNP-BC Palliative Medicine Team/ Cancer Center   *Please note that this is a verbal dictation therefore any spelling or grammatical errors are due to the "Dragon Medical One" system interpretation.

## 2022-06-27 ENCOUNTER — Inpatient Hospital Stay (HOSPITAL_BASED_OUTPATIENT_CLINIC_OR_DEPARTMENT_OTHER): Payer: BC Managed Care – PPO | Admitting: Nurse Practitioner

## 2022-06-27 ENCOUNTER — Encounter: Payer: Self-pay | Admitting: Nurse Practitioner

## 2022-06-27 ENCOUNTER — Inpatient Hospital Stay: Payer: BC Managed Care – PPO

## 2022-06-27 VITALS — BP 132/86 | HR 89 | Temp 98.9°F | Resp 15 | Ht 62.0 in | Wt 138.7 lb

## 2022-06-27 DIAGNOSIS — R530 Neoplastic (malignant) related fatigue: Secondary | ICD-10-CM

## 2022-06-27 DIAGNOSIS — G893 Neoplasm related pain (acute) (chronic): Secondary | ICD-10-CM

## 2022-06-27 DIAGNOSIS — Z95828 Presence of other vascular implants and grafts: Secondary | ICD-10-CM

## 2022-06-27 DIAGNOSIS — C259 Malignant neoplasm of pancreas, unspecified: Secondary | ICD-10-CM

## 2022-06-27 DIAGNOSIS — I81 Portal vein thrombosis: Secondary | ICD-10-CM | POA: Diagnosis not present

## 2022-06-27 DIAGNOSIS — I82621 Acute embolism and thrombosis of deep veins of right upper extremity: Secondary | ICD-10-CM | POA: Diagnosis not present

## 2022-06-27 DIAGNOSIS — Z515 Encounter for palliative care: Secondary | ICD-10-CM | POA: Diagnosis not present

## 2022-06-27 DIAGNOSIS — C250 Malignant neoplasm of head of pancreas: Secondary | ICD-10-CM | POA: Diagnosis not present

## 2022-06-27 DIAGNOSIS — Z7189 Other specified counseling: Secondary | ICD-10-CM

## 2022-06-27 DIAGNOSIS — C787 Secondary malignant neoplasm of liver and intrahepatic bile duct: Secondary | ICD-10-CM

## 2022-06-27 DIAGNOSIS — Z452 Encounter for adjustment and management of vascular access device: Secondary | ICD-10-CM | POA: Diagnosis not present

## 2022-06-27 DIAGNOSIS — R110 Nausea: Secondary | ICD-10-CM | POA: Diagnosis not present

## 2022-06-27 DIAGNOSIS — Z7901 Long term (current) use of anticoagulants: Secondary | ICD-10-CM | POA: Diagnosis not present

## 2022-06-27 LAB — CMP (CANCER CENTER ONLY)
ALT: 19 U/L (ref 0–44)
AST: 21 U/L (ref 15–41)
Albumin: 4.2 g/dL (ref 3.5–5.0)
Alkaline Phosphatase: 79 U/L (ref 38–126)
Anion gap: 6 (ref 5–15)
BUN: 9 mg/dL (ref 6–20)
CO2: 29 mmol/L (ref 22–32)
Calcium: 9.6 mg/dL (ref 8.9–10.3)
Chloride: 104 mmol/L (ref 98–111)
Creatinine: 0.69 mg/dL (ref 0.44–1.00)
GFR, Estimated: >60 mL/min (ref >60)
Glucose, Bld: 111 mg/dL — ABNORMAL HIGH (ref 70–99)
Potassium: 4 mmol/L (ref 3.5–5.1)
Sodium: 139 mmol/L (ref 135–145)
Total Bilirubin: 0.4 mg/dL (ref 0.3–1.2)
Total Protein: 7.3 g/dL (ref 6.5–8.1)

## 2022-06-27 LAB — CBC WITH DIFFERENTIAL (CANCER CENTER ONLY)
Abs Immature Granulocytes: 0 10*3/uL (ref 0.00–0.07)
Basophils Absolute: 0 10*3/uL (ref 0.0–0.1)
Basophils Relative: 1 %
Eosinophils Absolute: 0.1 10*3/uL (ref 0.0–0.5)
Eosinophils Relative: 3 %
HCT: 31.9 % — ABNORMAL LOW (ref 36.0–46.0)
Hemoglobin: 10.4 g/dL — ABNORMAL LOW (ref 12.0–15.0)
Immature Granulocytes: 0 %
Lymphocytes Relative: 34 %
Lymphs Abs: 1.3 10*3/uL (ref 0.7–4.0)
MCH: 29.2 pg (ref 26.0–34.0)
MCHC: 32.6 g/dL (ref 30.0–36.0)
MCV: 89.6 fL (ref 80.0–100.0)
Monocytes Absolute: 0.5 10*3/uL (ref 0.1–1.0)
Monocytes Relative: 12 %
Neutro Abs: 1.9 10*3/uL (ref 1.7–7.7)
Neutrophils Relative %: 50 %
Platelet Count: 188 10*3/uL (ref 150–400)
RBC: 3.56 MIL/uL — ABNORMAL LOW (ref 3.87–5.11)
RDW: 16.1 % — ABNORMAL HIGH (ref 11.5–15.5)
WBC Count: 3.8 10*3/uL — ABNORMAL LOW (ref 4.0–10.5)
nRBC: 0 % (ref 0.0–0.2)

## 2022-06-27 MED ORDER — SODIUM CHLORIDE 0.9% FLUSH
10.0000 mL | Freq: Once | INTRAVENOUS | Status: AC
  Administered 2022-06-27: 10 mL

## 2022-06-27 MED ORDER — HEPARIN SOD (PORK) LOCK FLUSH 100 UNIT/ML IV SOLN
500.0000 [IU] | Freq: Once | INTRAVENOUS | Status: AC
  Administered 2022-06-27: 500 [IU]

## 2022-06-27 MED ORDER — MORPHINE SULFATE 15 MG PO TABS
15.0000 mg | ORAL_TABLET | Freq: Four times a day (QID) | ORAL | 0 refills | Status: DC | PRN
Start: 2022-06-27 — End: 2023-02-17

## 2022-06-28 ENCOUNTER — Encounter: Payer: Self-pay | Admitting: Hematology and Oncology

## 2022-06-28 ENCOUNTER — Encounter: Payer: Self-pay | Admitting: Physician Assistant

## 2022-06-28 DIAGNOSIS — C259 Malignant neoplasm of pancreas, unspecified: Secondary | ICD-10-CM | POA: Diagnosis not present

## 2022-06-30 ENCOUNTER — Ambulatory Visit (HOSPITAL_COMMUNITY)
Admission: RE | Admit: 2022-06-30 | Discharge: 2022-06-30 | Disposition: A | Discharge status: Home / Self Care | Payer: BC Managed Care – PPO | Source: Ambulatory Visit | Attending: Physician Assistant | Admitting: Physician Assistant

## 2022-06-30 DIAGNOSIS — C259 Malignant neoplasm of pancreas, unspecified: Secondary | ICD-10-CM | POA: Insufficient documentation

## 2022-06-30 LAB — CANCER ANTIGEN 19-9: CA 19-9: 1610 U/mL — ABNORMAL HIGH (ref 0–35)

## 2022-06-30 MED ORDER — IOHEXOL 300 MG/ML  SOLN
100.0000 mL | Freq: Once | INTRAMUSCULAR | Status: AC | PRN
  Administered 2022-06-30: 100 mL via INTRAVENOUS

## 2022-07-01 ENCOUNTER — Other Ambulatory Visit: Payer: Self-pay

## 2022-07-08 ENCOUNTER — Other Ambulatory Visit: Payer: Self-pay | Admitting: Nurse Practitioner

## 2022-07-08 DIAGNOSIS — C259 Malignant neoplasm of pancreas, unspecified: Secondary | ICD-10-CM

## 2022-07-08 DIAGNOSIS — G893 Neoplasm related pain (acute) (chronic): Secondary | ICD-10-CM

## 2022-07-08 DIAGNOSIS — Z515 Encounter for palliative care: Secondary | ICD-10-CM

## 2022-07-08 MED ORDER — MORPHINE SULFATE ER 30 MG PO TBCR
30.0000 mg | EXTENDED_RELEASE_TABLET | Freq: Three times a day (TID) | ORAL | 0 refills | Status: DC
Start: 2022-07-08 — End: 2022-08-04

## 2022-07-08 MED ORDER — MORPHINE SULFATE ER 30 MG PO TBCR
30.0000 mg | EXTENDED_RELEASE_TABLET | Freq: Three times a day (TID) | ORAL | 0 refills | Status: DC
Start: 2022-07-08 — End: 2022-07-08

## 2022-07-12 NOTE — Progress Notes (Signed)
Palliative Medicine [facility]  Telephone:(336) [phone] Fax:(336) [phone]   Name: Kaitlin Ferrell Date: 07/12/2022 MRN: [id number]  DOB: July 18, 1965  Patient Care Team: Angelica Chessman, MD as PCP - General (Family Medicine) Reece Packer, MD as Consulting Physician (Medical Oncology) Pickenpack-Cousar, Arty Baumgartner, NP as Nurse Practitioner (Nurse Practitioner)    INTERVAL HISTORY: Kaitlin Ferrell is a 57 y.o. 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 Other history includes gestational trophoblastic disease, insomnia, HTN, and migraines. Palliative ask to see for symptom and pain management and goals of care.   SOCIAL HISTORY:     reports that she has never smoked. She has never used smokeless tobacco. She reports current alcohol use of about 1.0 standard drink of alcohol per week. She reports that she does not use drugs.  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  MEDICATIONS:  Current Outpatient Medications  Medication Sig Dispense Refill   amLODipine (NORVASC) 10 MG tablet Take 1 tablet by mouth daily.     apixaban (ELIQUIS) 5 MG TABS tablet Take 1 tablet (5 mg total) by mouth 2 (two) times daily. 60 tablet 2   Cholecalciferol 1.25 MG (50000 UT) capsule Take 50,000 Units by mouth once a week.     citalopram (CELEXA) 10  MG tablet Take by mouth. (Patient not taking: Reported on 06/15/2022)     diphenoxylate-atropine (LOMOTIL) 2.5-0.025 MG tablet Take 1 tablet by mouth 4 (four) times daily as needed for diarrhea or loose stools. 30 tablet 0   eletriptan (RELPAX) 40 MG tablet Take 1 tablet (40 mg total) by mouth as needed for migraine or headache. May repeat in 2 hours if needed. 15 tablet 6   hydrochlorothiazide (MICROZIDE) 12.5 MG capsule Take 12.5 mg by mouth daily as needed (swelling). (Patient not taking: Reported on 06/15/2022)     lidocaine-prilocaine (EMLA) cream Apply 1 Application topically as needed. 30 g 0   LORazepam (ATIVAN) 0.5 MG tablet Take 0.5 mg by mouth as needed for anxiety.     magic mouthwash (multi-ingredient) oral suspension Swish and swallow 5 mLs by mouth 4 times daily as needed for throat discomfort 400 mL 1   magnesium oxide (MAG-OX) 400 (241.3 MG) MG tablet TAKE 1 TABLET BY MOUTH TWICE A DAY 60 tablet 6   morphine (MS CONTIN) 30 MG 12 hr tablet Take 1 tablet (30 mg total) by mouth every 8 (eight) hours. 90 tablet 0   morphine (MSIR) 15 MG tablet Take 1 tablet (15 mg total) by mouth every 6 (six) hours as needed for severe pain or moderate pain. 45 tablet 0   OLANZapine (ZYPREXA) 10 MG tablet TAKE 1 TABLET BY MOUTH EVERYDAY AT BEDTIME (Patient taking differently: Take 10 mg by mouth at bedtime.) 90 tablet 1   ondansetron (ZOFRAN) 8 MG tablet Take 1 tablet (8 mg total) by mouth every 8 (eight) hours as  needed for nausea or vomiting. 90 tablet 0   ondansetron (ZOFRAN-ODT) 8 MG disintegrating tablet Take 1 tablet (8 mg total) by mouth every 8 (eight) hours as needed for nausea or vomiting. 20 tablet 0   promethazine (PHENERGAN) 25 MG tablet Take 1 tablet (25 mg total) by mouth every 6 (six) hours as needed for nausea or vomiting. 30 tablet 0   scopolamine (TRANSDERM-SCOP) 1 MG/3DAYS Place 1 patch (1.5 mg total) onto the skin every 3 (three) days. 10 patch 12   No current facility-administered  medications for this visit.    VITAL SIGNS: LMP 06/26/2013 Comment: low dose BCP There were no vitals filed for this visit.  Estimated body mass index is 25.37 kg/m as calculated from the following:   Height as of 06/27/22: 5\' 2"  (1.575 m).   Weight as of 06/27/22: 138 lb 11.2 oz (62.9 kg).   PERFORMANCE STATUS (ECOG) : 1 - Symptomatic but completely ambulatory   Physical Exam General: NAD Cardiovascular: regular rate  Pulmonary: normal breathing pattern  Abdomen: soft, nontender, + bowel sounds Extremities: no edema, no joint deformities Skin: no rashes Neurological: AAO x4  IMPRESSION: Mrs. Kaitlin Ferrell presents to clinic for follow-up. No acute distress noted. Is feeling much better overall. Denies nausea, vomiting, constipation, or diarrhea. Is trying to remain as active as possible. Is meeting with Dr. Leonides Schanz today to discuss further treatment options.   Neoplasm related pain Mrs. Ervine reports pain is much better controlled. She is much appreciative of this.    We discussed her current regimen. She is taking MS Contin 30 mg every 8 hours and MS IR as needed. Does not feel MS Contin does much when taking as MS IR 15mg  every 6 hours as needed. Not requiring around the clock. Given improvement no changes to regimen at this time.    We will continue to closely monitor and adjust as needed.    Goals of Care   07/07/22- We discussed her current illness and what it means in the larger context of her on-going co-morbidities. Natural disease trajectory and expectations were discussed.   Patient and husband are realistic in their understanding of her cancer diagnosis and trajectory. She shares they have also discussed at length with her children. Patient states she chose to pursue chemotherapy break due to significance of her symptoms. She was extremely fatigue, weak, could not get out of bed, no appetite, weight loss, nausea, vomiting, and diarrhea. [name] shares she was unable to  perform ADLs and husband was having to bathe her. They were concerned that she was facing end-of-life given how poor her quality of life was. Patient and husband speaks to appreciation of her improvement in quality of life over the past several weeks.    I created space and opportunity for patient and husband to discuss goals and hopes. Patient is trying to focus on healthy food options and be as active as she can. They speak to plans of watchful waiting to see what upcoming CT scan results show in terms of her cancer. She will then consider if she would like to pursue additional treatment options versus not. They are clear in their understanding what pros and cons are of pursing or not to pursue further treatment including end-of-life. [name] speaks to her quality of life is most important over quantity. She would not want to spend last moments in a stupor suffering state. Emotional support provided.   We discussed Her current illness and what it means in the larger  context of Her on-going co-morbidities. Natural disease trajectory and expectations were discussed.  I discussed the importance of continued conversation with family and their medical providers regarding overall plan of care and treatment options, ensuring decisions are within the context of the patients values and GOCs.  PLAN:  MS Contin 15mg  every 8 hours MS IR for breakthrough pain Senna-S for bowel regimen.  Extensive goals of care discussions. Ongoing support.  I will plan to see patient back in 2-3 weeks in collaboration to other oncology appointments.    Patient expressed understanding and was in agreement with this plan. She also understands that She can call the clinic at any time with any questions, concerns, or complaints.   Any controlled substances utilized were prescribed in the context of palliative care. PDMP has been reviewed.    Visit consisted of counseling and education dealing with the complex and emotionally  intense issues of symptom management and palliative care in the setting of serious and potentially life-threatening illness.Greater than 50%  of this time was spent counseling and coordinating care related to the above assessment and plan.  Willette Alma, AGPCNP-BC  Palliative Medicine Team/[facility]  *Please note that this is a verbal dictation therefore any spelling or grammatical errors are due to the "Dragon Medical One" system interpretation.

## 2022-07-13 ENCOUNTER — Other Ambulatory Visit: Payer: Self-pay

## 2022-07-13 ENCOUNTER — Inpatient Hospital Stay (HOSPITAL_BASED_OUTPATIENT_CLINIC_OR_DEPARTMENT_OTHER): Payer: BC Managed Care – PPO | Admitting: Nurse Practitioner

## 2022-07-13 ENCOUNTER — Inpatient Hospital Stay: Payer: BC Managed Care – PPO | Attending: Physician Assistant

## 2022-07-13 ENCOUNTER — Inpatient Hospital Stay (HOSPITAL_BASED_OUTPATIENT_CLINIC_OR_DEPARTMENT_OTHER): Payer: BC Managed Care – PPO | Admitting: Hematology and Oncology

## 2022-07-13 VITALS — BP 117/78 | HR 71 | Temp 97.3°F | Resp 17 | Wt 138.9 lb

## 2022-07-13 DIAGNOSIS — R112 Nausea with vomiting, unspecified: Secondary | ICD-10-CM | POA: Insufficient documentation

## 2022-07-13 DIAGNOSIS — Z452 Encounter for adjustment and management of vascular access device: Secondary | ICD-10-CM | POA: Diagnosis not present

## 2022-07-13 DIAGNOSIS — C787 Secondary malignant neoplasm of liver and intrahepatic bile duct: Secondary | ICD-10-CM

## 2022-07-13 DIAGNOSIS — G893 Neoplasm related pain (acute) (chronic): Secondary | ICD-10-CM

## 2022-07-13 DIAGNOSIS — C259 Malignant neoplasm of pancreas, unspecified: Secondary | ICD-10-CM

## 2022-07-13 DIAGNOSIS — Z95828 Presence of other vascular implants and grafts: Secondary | ICD-10-CM

## 2022-07-13 DIAGNOSIS — I81 Portal vein thrombosis: Secondary | ICD-10-CM | POA: Insufficient documentation

## 2022-07-13 DIAGNOSIS — K5903 Drug induced constipation: Secondary | ICD-10-CM

## 2022-07-13 DIAGNOSIS — R7989 Other specified abnormal findings of blood chemistry: Secondary | ICD-10-CM | POA: Insufficient documentation

## 2022-07-13 DIAGNOSIS — C250 Malignant neoplasm of head of pancreas: Secondary | ICD-10-CM | POA: Insufficient documentation

## 2022-07-13 DIAGNOSIS — R630 Anorexia: Secondary | ICD-10-CM | POA: Diagnosis not present

## 2022-07-13 DIAGNOSIS — I10 Essential (primary) hypertension: Secondary | ICD-10-CM | POA: Diagnosis not present

## 2022-07-13 DIAGNOSIS — Z515 Encounter for palliative care: Secondary | ICD-10-CM

## 2022-07-13 DIAGNOSIS — Z5189 Encounter for other specified aftercare: Secondary | ICD-10-CM | POA: Diagnosis not present

## 2022-07-13 DIAGNOSIS — Z5111 Encounter for antineoplastic chemotherapy: Secondary | ICD-10-CM | POA: Diagnosis not present

## 2022-07-13 DIAGNOSIS — R197 Diarrhea, unspecified: Secondary | ICD-10-CM | POA: Diagnosis not present

## 2022-07-13 DIAGNOSIS — I82A11 Acute embolism and thrombosis of right axillary vein: Secondary | ICD-10-CM | POA: Diagnosis not present

## 2022-07-13 DIAGNOSIS — Z7901 Long term (current) use of anticoagulants: Secondary | ICD-10-CM | POA: Insufficient documentation

## 2022-07-13 DIAGNOSIS — Z7189 Other specified counseling: Secondary | ICD-10-CM

## 2022-07-13 LAB — CBC WITH DIFFERENTIAL (CANCER CENTER ONLY)
Abs Immature Granulocytes: 0.01 10*3/uL (ref 0.00–0.07)
Basophils Absolute: 0 10*3/uL (ref 0.0–0.1)
Basophils Relative: 0 %
Eosinophils Absolute: 0.1 10*3/uL (ref 0.0–0.5)
Eosinophils Relative: 2 %
HCT: 30.9 % — ABNORMAL LOW (ref 36.0–46.0)
Hemoglobin: 9.8 g/dL — ABNORMAL LOW (ref 12.0–15.0)
Immature Granulocytes: 0 %
Lymphocytes Relative: 34 %
Lymphs Abs: 1.3 10*3/uL (ref 0.7–4.0)
MCH: 29 pg (ref 26.0–34.0)
MCHC: 31.7 g/dL (ref 30.0–36.0)
MCV: 91.4 fL (ref 80.0–100.0)
Monocytes Absolute: 0.5 10*3/uL (ref 0.1–1.0)
Monocytes Relative: 13 %
Neutro Abs: 2 10*3/uL (ref 1.7–7.7)
Neutrophils Relative %: 51 %
Platelet Count: 183 10*3/uL (ref 150–400)
RBC: 3.38 MIL/uL — ABNORMAL LOW (ref 3.87–5.11)
RDW: 14.9 % (ref 11.5–15.5)
WBC Count: 3.9 10*3/uL — ABNORMAL LOW (ref 4.0–10.5)
nRBC: 0 % (ref 0.0–0.2)

## 2022-07-13 LAB — CMP (CANCER CENTER ONLY)
ALT: 24 U/L (ref 0–44)
AST: 28 U/L (ref 15–41)
Albumin: 3.8 g/dL (ref 3.5–5.0)
Alkaline Phosphatase: 103 U/L (ref 38–126)
Anion gap: 8 (ref 5–15)
BUN: 9 mg/dL (ref 6–20)
CO2: 26 mmol/L (ref 22–32)
Calcium: 9.3 mg/dL (ref 8.9–10.3)
Chloride: 103 mmol/L (ref 98–111)
Creatinine: 0.54 mg/dL (ref 0.44–1.00)
GFR, Estimated: >60 mL/min (ref >60)
Glucose, Bld: 90 mg/dL (ref 70–99)
Potassium: 3.6 mmol/L (ref 3.5–5.1)
Sodium: 137 mmol/L (ref 135–145)
Total Bilirubin: 0.6 mg/dL (ref 0.3–1.2)
Total Protein: 7 g/dL (ref 6.5–8.1)

## 2022-07-13 MED ORDER — HEPARIN SOD (PORK) LOCK FLUSH 100 UNIT/ML IV SOLN
500.0000 [IU] | Freq: Once | INTRAVENOUS | Status: AC
  Administered 2022-07-13: 500 [IU]

## 2022-07-13 MED ORDER — SODIUM CHLORIDE 0.9% FLUSH
10.0000 mL | Freq: Once | INTRAVENOUS | Status: AC
  Administered 2022-07-13: 10 mL

## 2022-07-13 NOTE — Progress Notes (Signed)
[facility] Telephone:([phone]) [phone]   [phone]  PROGRESS NOTE:  Patient Care Team: Angelica Chessman, MD as PCP - General (Family Medicine) Reece Packer, MD as Consulting Physician (Medical Oncology) Pickenpack-Cousar, Arty Baumgartner, NP as Nurse Practitioner (Nurse Practitioner)  CHIEF COMPLAINTS/PURPOSE OF CONSULTATION:  Metastatic pancreatic adenocarcinoma involving the liver  ONCOLOGIC HISTORY: Presented with nausea and right upper quadrant pain 03/08/2022: Abdominal US showed multiple hypoechoic masses within the liver are indeterminate.  03/10/2022: MR abdomen: Hypoenhancing mass compatible with pancreatic head adenocarcinoma with numerous targetoid enhancing masses in all segments of the liver, compatible with metastatic disease. The mass severely effaces and nearly occludes the portal vein and splenic vein, and occludes the superior mesenteric vein with some collaterals noted. The mass substantially abuts the proximal transverse duodenum, although overt duodenal invasion is indeterminate. 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 Prominent stool throughout the colon favors constipation. Fluid-fluid level in the gallbladder likely from sludge. 03/14/2022: Establish care with [facility] Hematology/Oncology 03/17/2022: CT chest: no definitive evidence of lung metastases.  03/22/2022: Liver biopsy confirmed metastatic adenocarcinoma, pancreatic primary.  03/30/2022: Cycle 1, Day 1 of mFOLFIRINOX 04/14/2022: Cycle 2, Day 1 of mFOLFIRINOX HELD due to ANC 300.  04/20/2022: Cycle 2, Day 1 of mFOLFIRINOX 05/04/2022: Cycle 3, Day 1 of  mFOLFIRINOX 05/19/2022: Cycle 4, Day 1 of mFOLFIRINOX HELD per patient request.   HISTORY OF PRESENTING ILLNESS:  Kaitlin Ferrell presents today for a follow up. She was last seen by Dr. Leonides Schanz on 05/19/2022. In the interim, she has continued on a treatment break.   Ms. Meikle reports she has been well overall in the interim since her last visit.  She notes that she has had a good holiday off chemotherapy.  She notes that her pain is currently well-managed and that her symptoms from the chemotherapy has subsided.  Her energy levels are improving and her appetite is stronger.  Overall she feels like she is steadily improving. She denies fevers, chills, sweats, shortness of breath, chest pain or cough. She has no other complaints. Rest of the 10 point ROS is below.   Today she is accompanied by her husband.  We discussed the options moving forward including continuation of FOLFIRINOX, dose reduced FOLFIRINOX, or transitioning to gemcitabine and paclitaxel.  The patient noted that given the good response we saw on her CT scan she would like to pursue full-strength FOLFIRINOX chemotherapy.  We noted she would likely develop the same symptoms but she was agreeable to continuing.  Will plan to proceed with full dose FOLFIRINOX chemotherapy as requested by the patient.  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   treated with 4 cycles of actinomycin D from 12-02-11 thru 01-13-12   H/O molar pregnancy, antepartum    Hypertension    Migraine    Miscarriage     SURGICAL HISTORY: Past Surgical History:  Procedure Laterality Date   BREAST BIOPSY  2001   Left   BREAST LUMPECTOMY  DILATION AND CURETTAGE OF UTERUS  1992, 09/2011   DILATION AND EVACUATION  2011   INSERTION OF MESH N/A 09/03/2013    Procedure: INSERTION OF MESH;  Surgeon: Ardeth Sportsman, MD;  Location: [facility];  Service: General;  Laterality: N/A;   IR CHEST FLUORO  04/14/2022   IR IMAGING GUIDED PORT INSERTION  03/22/2022   IR IMAGING GUIDED PORT INSERTION  05/02/2022   IR PORT REPAIR CENTRAL VENOUS ACCESS DEVICE  04/19/2022   IR REMOVAL TUN CV CATH W/O FL  05/02/2022   IR US LIVER BIOPSY  03/22/2022   PORTACATH PLACEMENT  11/2011   PORTACATH PLACEMENT     REMOVAL  MARCH 2014   VENTRAL HERNIA REPAIR N/A 09/03/2013   Procedure: LAPAROSCOPIC VENTRAL WALL HERNIA REPAIR;  Surgeon: Ardeth Sportsman, MD;  Location: [facility];  Service: General;  Laterality: N/A;   WISDOM TOOTH EXTRACTION     in 11th grade    SOCIAL HISTORY: Social History   Socioeconomic History   Marital status: Married    Spouse name: Kaitlin Ferrell   Number of children: 2   Years of education: college   Highest education level: Not on file  Occupational History    Comment: [employer]  Tobacco Use   Smoking status: Never   Smokeless tobacco: Never  Substance and Sexual Activity   Alcohol use: Yes    Alcohol/week: 1.0 standard drink of alcohol    Types: 1 Glasses of wine per week    Comment: occas   Drug use: No   Sexual activity: Not Currently    Birth control/protection: Pill  Other Topics Concern   Not on file  Social History Narrative   Patient is married Kaitlin Ferrell). Patient works part time at [employer].   Right handed.   Patient has her CMA.   Caffeine- None         Social Determinants of Health   Financial Resource Strain: Not on file  Food Insecurity: No Food Insecurity (05/10/2022)   Hunger Vital Sign    Worried About Running Out of Food in the Last Year: Never true    Ran Out of Food in the Last Year: Never true  Transportation Needs: No Transportation Needs (05/10/2022)   PRAPARE - Administrator, Civil Service (Medical): No    Lack of Transportation (Non-Medical): No  Physical Activity: Not on file  Stress: Not on file   Social Connections: Not on file  Intimate Partner Violence: Not At Risk (05/10/2022)   Humiliation, Afraid, Rape, and Kick questionnaire    Fear of Current or Ex-Partner: No    Emotionally Abused: No    Physically Abused: No    Sexually Abused: No    FAMILY HISTORY: Family History  Problem Relation Age of Onset   Breast cancer Mother        dx > 50   Heart attack Mother    Diabetes Father    Breast cancer Maternal Aunt        dx > 50   Breast cancer Maternal Aunt        dx > 50   Breast cancer Maternal Grandmother        ? < 50   Stroke Paternal Grandmother    Alcoholism Other     ALLERGIES:  is allergic to compazine [prochlorperazine edisylate], propofol, oxycodone-acetaminophen, prochlorperazine, and labetalol.  MEDICATIONS:  Current Outpatient Medications  Medication Sig Dispense Refill   Cyanocobalamin (VITAMIN B-12 CR PO) Take by  mouth.     ELDERBERRY PO Take by mouth.     FIBER ADULT GUMMIES PO Take by mouth.     Folic Acid (FOLATE PO) Place 1 mg under the tongue daily.     amLODipine (NORVASC) 10 MG tablet Take 1 tablet by mouth daily.     apixaban (ELIQUIS) 5 MG TABS tablet Take 1 tablet (5 mg total) by mouth 2 (two) times daily. 60 tablet 2   Cholecalciferol 1.25 MG (50000 UT) capsule Take 50,000 Units by mouth once a week.     citalopram (CELEXA) 10 MG tablet Take by mouth. (Patient not taking: Reported on 06/15/2022)     diphenoxylate-atropine (LOMOTIL) 2.5-0.025 MG tablet Take 1 tablet by mouth 4 (four) times daily as needed for diarrhea or loose stools. 30 tablet 0   eletriptan (RELPAX) 40 MG tablet Take 1 tablet (40 mg total) by mouth as needed for migraine or headache. May repeat in 2 hours if needed. 15 tablet 6   hydrochlorothiazide (MICROZIDE) 12.5 MG capsule Take 12.5 mg by mouth daily as needed (swelling). (Patient not taking: Reported on 06/15/2022)     lidocaine-prilocaine (EMLA) cream Apply 1 Application topically as needed. 30 g 0   LORazepam (ATIVAN)  0.5 MG tablet Take 0.5 mg by mouth as needed for anxiety.     magic mouthwash (multi-ingredient) oral suspension Swish and swallow 5 mLs by mouth 4 times daily as needed for throat discomfort 400 mL 1   magnesium oxide (MAG-OX) 400 (241.3 MG) MG tablet TAKE 1 TABLET BY MOUTH TWICE A DAY 60 tablet 6   morphine (MS CONTIN) 30 MG 12 hr tablet Take 1 tablet (30 mg total) by mouth every 8 (eight) hours. 90 tablet 0   morphine (MSIR) 15 MG tablet Take 1 tablet (15 mg total) by mouth every 6 (six) hours as needed for severe pain or moderate pain. 45 tablet 0   OLANZapine (ZYPREXA) 10 MG tablet TAKE 1 TABLET BY MOUTH EVERYDAY AT BEDTIME (Patient not taking: Reported on 07/13/2022) 90 tablet 1   ondansetron (ZOFRAN) 8 MG tablet Take 1 tablet (8 mg total) by mouth every 8 (eight) hours as needed for nausea or vomiting. 90 tablet 0   ondansetron (ZOFRAN-ODT) 8 MG disintegrating tablet Take 1 tablet (8 mg total) by mouth every 8 (eight) hours as needed for nausea or vomiting. 20 tablet 0   promethazine (PHENERGAN) 25 MG tablet Take 1 tablet (25 mg total) by mouth every 6 (six) hours as needed for nausea or vomiting. 30 tablet 0   scopolamine (TRANSDERM-SCOP) 1 MG/3DAYS Place 1 patch (1.5 mg total) onto the skin every 3 (three) days. (Patient not taking: Reported on 07/13/2022) 10 patch 12   No current facility-administered medications for this visit.    REVIEW OF SYSTEMS:   Constitutional: ( - ) fevers, ( - )  chills , ( - ) night sweats Eyes: ( - ) blurriness of vision, ( - ) double vision, ( - ) watery eyes Ears, nose, mouth, throat, and face: ( - ) mucositis, ( - ) sore throat Respiratory: ( - ) cough, ( - ) dyspnea, ( - ) wheezes Cardiovascular: ( - ) palpitation, ( - ) chest discomfort, ( - ) lower extremity swelling Gastrointestinal:  (-) nausea, ( - ) heartburn, ( -) change in bowel habits Skin: ( - ) abnormal skin rashes Lymphatics: ( - ) new lymphadenopathy, ( - ) easy bruising Neurological: ( - )  numbness, ( - ) tingling, ( - )  new weaknesses Behavioral/Psych: ( - ) mood change, ( - ) new changes  All other systems were reviewed with the patient and are negative.  PHYSICAL EXAMINATION: ECOG PERFORMANCE STATUS: 1 - Symptomatic but completely ambulatory  There were no vitals filed for this visit. There were no vitals filed for this visit.  GENERAL: well appearing female in NAD  SKIN: skin color, texture, turgor are normal, no rashes or significant lesions EYES: conjunctiva are pink and non-injected, sclera clear LUNGS: clear to auscultation and percussion with normal breathing effort HEART: regular rate & rhythm and no murmurs and no lower extremity edema ABDOMEN:  normal bowel sounds. Abdomen is tender to palpation in epigastric and RUQ region. No palpable masses or hepatomegaly appreciated.  Musculoskeletal: no cyanosis of digits and no clubbing  PSYCH: alert & oriented x 3, fluent speech NEURO: no focal motor/sensory deficits  LABORATORY DATA:  I have reviewed the data as listed    Latest Ref Rng & Units 07/13/2022    2:58 PM 06/27/2022   12:36 PM 06/15/2022   10:28 AM  CBC  WBC 4.0 - 10.5 K/uL 3.9  3.8  4.5   Hemoglobin 12.0 - 15.0 g/dL 9.8  40.9  81.1   Hematocrit 36.0 - 46.0 % 30.9  31.9  31.6   Platelets 150 - 400 K/uL 183  188  224        Latest Ref Rng & Units 07/13/2022    2:58 PM 06/27/2022   12:36 PM 06/15/2022   10:28 AM  CMP  Glucose 70 - 99 mg/dL 90  914  782   BUN 6 - 20 mg/dL 9  9  9    Creatinine 0.44 - 1.00 mg/dL 9.56  2.13  0.86   Sodium 135 - 145 mmol/L 137  139  138   Potassium 3.5 - 5.1 mmol/L 3.6  4.0  3.9   Chloride 98 - 111 mmol/L 103  104  105   CO2 22 - 32 mmol/L 26  29  29    Calcium 8.9 - 10.3 mg/dL 9.3  9.6  9.4   Total Protein 6.5 - 8.1 g/dL 7.0  7.3  7.3   Total Bilirubin 0.3 - 1.2 mg/dL 0.6  0.4  0.4   Alkaline Phos 38 - 126 U/L 103  79  105   AST 15 - 41 U/L 28  21  43   ALT 0 - 44 U/L 24  19  30       RADIOGRAPHIC STUDIES: I have  personally reviewed the radiological images as listed and agreed with the findings in the report. CT CHEST ABDOMEN PELVIS W CONTRAST  Result Date: 07/05/2022 CLINICAL DATA:  Metastatic pancreatic cancer with history of chemotherapy. Weight loss. Restaging. * Tracking Code: BO * EXAM: CT CHEST, ABDOMEN, AND PELVIS WITH CONTRAST TECHNIQUE: Multidetector CT imaging of the chest, abdomen and pelvis was performed following the standard protocol during bolus administration of intravenous contrast. RADIATION DOSE REDUCTION: This exam was performed according to the departmental dose-optimization program which includes automated exposure control, adjustment of the mA and/or kV according to patient size and/or use of iterative reconstruction technique. CONTRAST:  OMNIPAQUE IOHEXOL 300 MG/ML  SOLN COMPARISON:  03/10/2022 MRI abdomen.  03/17/2022 chest CT. FINDINGS: CT CHEST FINDINGS Cardiovascular: Normal heart size. No significant pericardial effusion/thickening. Left internal jugular Port-A-Cath terminates at the cavoatrial junction. Atherosclerotic nonaneurysmal thoracic aorta. Normal caliber pulmonary arteries. No central pulmonary emboli. Mediastinum/Nodes: No significant thyroid nodules. Unremarkable esophagus. No pathologically enlarged axillary,  mediastinal or hilar lymph nodes. Lungs/Pleura: No pneumothorax. No pleural effusion. No acute consolidative airspace disease or lung masses. Previously visualized 0.3 cm peripheral basilar left lower lobe solid pulmonary nodule from 03/17/2022 chest CT is decreased to 0.1 cm (series 4/image 102). Tiny 0.2 cm anterior left lower lobe pulmonary nodule along the major fissure (series 4/image 87), stable. No new significant pulmonary nodules. Musculoskeletal: No aggressive appearing focal osseous lesions. Mild thoracic spondylosis. CT ABDOMEN PELVIS FINDINGS Hepatobiliary: Numerous (at least 12) liver masses with targetoid enhancement scattered throughout the liver,  decreased from 03/17/2022 chest CT. Representative 2.2 x 1.9 cm segment 4A left liver mass (series 2/image 75), decreased from 3.2 x 2.7 cm using similar measurement technique. Representative 1.5 x 1.1 cm peripheral segment 6 right liver mass (series 2/image 88), mildly decreased from 1.7 x 1.3 cm. Representative 1.4 x 1.3 cm segment 3 left liver mass (series 2/image 97), mildly decreased from 1.8 x 1.7 cm. No new or enlarging liver lesions. Normal gallbladder with no radiopaque cholelithiasis. No biliary ductal dilatation. CBD diameter 3 mm. Pancreas: Poorly marginated hypoenhancing 4.2 x 3.0 cm pancreatic head mass (series 2/image 103) encasing the SMA, mildly decreased from 4.5 x 3.4 cm on 03/10/2022 abdomen MRI. Chronic occlusion of the SMV. Stable mild dilatation of the main pancreatic duct (3 mm diameter). Spleen: Normal size. No mass. Adrenals/Urinary Tract: Normal adrenals. Normal kidneys with no hydronephrosis and no renal mass. Normal bladder. Stomach/Bowel: Normal non-distended stomach. Normal caliber small bowel with no small bowel wall thickening. Normal appendix. Normal large bowel with no diverticulosis, large bowel wall thickening or pericolonic fat stranding. Vascular/Lymphatic: Normal caliber abdominal aorta. Patent renal, portal, splenic and hepatic veins. Enlarged 1.3 cm short axis diameter left periaortic node (series 9/image 99), previously 1.3 cm on 03/10/2022 MRI abdomen, unchanged. Enlarged 1.5 cm left common iliac node (series 9/image 109), not recently imaged. Reproductive: Hysterectomy.  No adnexal masses. Other: No pneumoperitoneum. Trace pelvic ascites. No focal fluid collection. Musculoskeletal: No aggressive appearing focal osseous lesions. IMPRESSION: 1. Poorly marginated hypoenhancing 4.2 cm pancreatic head mass encasing the SMA, compatible with known primary pancreatic adenocarcinoma, mildly decreased from 03/10/2022 abdomen MRI. Chronic occlusion of the SMV. 2. Widespread liver  metastases, mildly decreased from 03/17/2022 chest CT. 3. Left para-aortic lymphadenopathy, stable from 03/10/2022 MRI abdomen. Left common iliac lymphadenopathy, not recently imaged. Findings are compatible with nodal metastases. 4. Previously described tiny 0.3 cm left lower lobe pulmonary nodule is decreased, potentially representing response to therapy of a tiny pulmonary metastasis. 5. No new or progressive metastatic disease in the chest, abdomen or pelvis. 6. Trace pelvic ascites. 7.  Aortic Atherosclerosis (ICD10-I70.0). Electronically Signed   By: Delbert Phenix M.D.   On: 07/05/2022 10:16    ASSESSMENT & PLAN Ajaya Crutchfield is a 57 y.o. female who returns to the clinic for newly diagnosed pancreatic adenocarcinoma.    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 Dr. Leonides Schanz recommends chemotherapy regimen with FOLFIRINOX q 2 weeks.  --Plan to obtain restaging CT scans every 3 months to assess treatment response.  --[facility] Testing: MS-stable, 2 Muts/Mb, ATMH659fs*31, KRASG12L, MDM2 amplification, CDKN2A/B. No targetable mutations.  --Started mFOLFIRINOX on 03/30/2022 PLAN: --Last dose was Cycle 3 on 05/04/2022. Due to poor tolerance and patient's request, she has been on a treatment break until present.  --  Labs from today reviewed with patient.  White blood cell 3.9, hemoglobin 9.8, MCV 91.4, and platelets of 183 --Re-discussed options with patient moving forward.  We could consider either dose reducing the FOLFIRINOX, transitioning to gem/abraxane, or discontinuing chemotherapy altogether.  --Patient would like to restart full-strength FOLFIRINOX chemotherapy. Will resume as Cycle 4 of FOLFIRINOX.  We had a detailed discussion about the  risks and benefits of this approach. 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  --Currently on Eliquis 5 mg BID  #Neutropenia--resolved --Due to critical levels with ANC 300, gave one dose of Zarzio 300 mcg in clinic  --Defered treatment by one week and plan to give Udenyca on Day 3 starting with Cycle 2.  --Neutropenic precautions given to patient including monitoring for fevers.   Elevated LFTs: --ALT 24, AST 28 --Etiology includes chemotherapy induced, liver metastases versus increased acetaminophen use. --Monitor for now  Nausea/Vomiting: --Secondary to chemotherapy, resolved with treatment holiday -- Continue to take scopolamine patch, zofran 8mg  q8H PRN for nausea, zyprexa nightly PRN for nausea.   #Epigastric/RUQ/back pain: --Likely secondary to underlying malignancy --Tramadol was ineffective.  --Current pain regimen includes MS contin to 30 mg q 12 hours and Norco 10-325 mg.   #Appetite loss/weight loss: --Improved with treatment holiday  #Family history of breast cancer: --Due to strong family history of cancer and has undergone genetic testing in the past.  --Underwent genetic testing on 04/07/2022 that was negative. 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   #Supportive Care -- chemotherapy education to be scheduled  -- port placement complete  -- EMLA cream for port   Orders Placed This Encounter  Procedures   CBC with Differential (Cancer Center Only)    Standing Status:   Future    Standing Expiration Date:   07/21/2023   CMP (Cancer Center only)    Standing Status:   Future    Standing Expiration Date:   07/21/2023    All questions were  answered. The patient knows to call the clinic with any problems, questions or concerns.  I have spent a total of 30 minutes minutes of face-to-face and non-face-to-face time, preparing to see the patient,  performing a medically appropriate examination, counseling and educating the patient, documenting clinical information in the electronic health record,  and care coordination.   Ulysees Barns, MD Department of Hematology/Oncology [facility] Phone: [phone] Pager: [phone] Email: [email or web address]

## 2022-07-14 ENCOUNTER — Encounter: Payer: Self-pay | Admitting: Hematology and Oncology

## 2022-07-15 ENCOUNTER — Encounter: Payer: Self-pay | Admitting: Nurse Practitioner

## 2022-07-15 ENCOUNTER — Telehealth: Payer: Self-pay | Admitting: Hematology and Oncology

## 2022-07-15 ENCOUNTER — Encounter: Payer: Self-pay | Admitting: Hematology and Oncology

## 2022-07-15 ENCOUNTER — Telehealth: Payer: Self-pay

## 2022-07-15 NOTE — Telephone Encounter (Signed)
Notified Patient of completion of [employer] Benefit Form. Patient states that she will pick up a copy of form and will send it to the insurance company. No other needs or concerns voiced at this time.

## 2022-07-20 ENCOUNTER — Encounter: Payer: Self-pay | Admitting: Hematology and Oncology

## 2022-07-21 ENCOUNTER — Inpatient Hospital Stay: Payer: BC Managed Care – PPO

## 2022-07-21 ENCOUNTER — Other Ambulatory Visit: Payer: Self-pay

## 2022-07-21 ENCOUNTER — Inpatient Hospital Stay (HOSPITAL_BASED_OUTPATIENT_CLINIC_OR_DEPARTMENT_OTHER): Payer: BC Managed Care – PPO | Admitting: Hematology and Oncology

## 2022-07-21 VITALS — BP 134/85 | HR 79 | Temp 97.5°F | Resp 15 | Wt 137.7 lb

## 2022-07-21 DIAGNOSIS — G893 Neoplasm related pain (acute) (chronic): Secondary | ICD-10-CM | POA: Diagnosis not present

## 2022-07-21 DIAGNOSIS — Z5189 Encounter for other specified aftercare: Secondary | ICD-10-CM | POA: Diagnosis not present

## 2022-07-21 DIAGNOSIS — R7989 Other specified abnormal findings of blood chemistry: Secondary | ICD-10-CM | POA: Diagnosis not present

## 2022-07-21 DIAGNOSIS — Z515 Encounter for palliative care: Secondary | ICD-10-CM | POA: Diagnosis not present

## 2022-07-21 DIAGNOSIS — Z7901 Long term (current) use of anticoagulants: Secondary | ICD-10-CM | POA: Diagnosis not present

## 2022-07-21 DIAGNOSIS — Z95828 Presence of other vascular implants and grafts: Secondary | ICD-10-CM

## 2022-07-21 DIAGNOSIS — C259 Malignant neoplasm of pancreas, unspecified: Secondary | ICD-10-CM

## 2022-07-21 DIAGNOSIS — R197 Diarrhea, unspecified: Secondary | ICD-10-CM | POA: Diagnosis not present

## 2022-07-21 DIAGNOSIS — Z452 Encounter for adjustment and management of vascular access device: Secondary | ICD-10-CM | POA: Diagnosis not present

## 2022-07-21 DIAGNOSIS — C787 Secondary malignant neoplasm of liver and intrahepatic bile duct: Secondary | ICD-10-CM | POA: Diagnosis not present

## 2022-07-21 DIAGNOSIS — I10 Essential (primary) hypertension: Secondary | ICD-10-CM | POA: Diagnosis not present

## 2022-07-21 DIAGNOSIS — R630 Anorexia: Secondary | ICD-10-CM | POA: Diagnosis not present

## 2022-07-21 DIAGNOSIS — Z5111 Encounter for antineoplastic chemotherapy: Secondary | ICD-10-CM | POA: Diagnosis not present

## 2022-07-21 DIAGNOSIS — I82A11 Acute embolism and thrombosis of right axillary vein: Secondary | ICD-10-CM | POA: Diagnosis not present

## 2022-07-21 DIAGNOSIS — I81 Portal vein thrombosis: Secondary | ICD-10-CM | POA: Diagnosis not present

## 2022-07-21 DIAGNOSIS — R112 Nausea with vomiting, unspecified: Secondary | ICD-10-CM | POA: Diagnosis not present

## 2022-07-21 DIAGNOSIS — C250 Malignant neoplasm of head of pancreas: Secondary | ICD-10-CM | POA: Diagnosis not present

## 2022-07-21 LAB — CBC WITH DIFFERENTIAL (CANCER CENTER ONLY)
Abs Immature Granulocytes: 0.01 10*3/uL (ref 0.00–0.07)
Basophils Absolute: 0.1 10*3/uL (ref 0.0–0.1)
Basophils Relative: 1 %
Eosinophils Absolute: 0.1 10*3/uL (ref 0.0–0.5)
Eosinophils Relative: 3 %
HCT: 30.9 % — ABNORMAL LOW (ref 36.0–46.0)
Hemoglobin: 10.1 g/dL — ABNORMAL LOW (ref 12.0–15.0)
Immature Granulocytes: 0 %
Lymphocytes Relative: 41 %
Lymphs Abs: 1.7 10*3/uL (ref 0.7–4.0)
MCH: 29.7 pg (ref 26.0–34.0)
MCHC: 32.7 g/dL (ref 30.0–36.0)
MCV: 90.9 fL (ref 80.0–100.0)
Monocytes Absolute: 0.4 10*3/uL (ref 0.1–1.0)
Monocytes Relative: 10 %
Neutro Abs: 1.9 10*3/uL (ref 1.7–7.7)
Neutrophils Relative %: 45 %
Platelet Count: 173 10*3/uL (ref 150–400)
RBC: 3.4 MIL/uL — ABNORMAL LOW (ref 3.87–5.11)
RDW: 14.2 % (ref 11.5–15.5)
Smear Review: NORMAL
WBC Count: 4.3 10*3/uL (ref 4.0–10.5)
nRBC: 0 % (ref 0.0–0.2)

## 2022-07-21 LAB — CMP (CANCER CENTER ONLY)
ALT: 28 U/L (ref 0–44)
AST: 31 U/L (ref 15–41)
Albumin: 3.8 g/dL (ref 3.5–5.0)
Alkaline Phosphatase: 141 U/L — ABNORMAL HIGH (ref 38–126)
Anion gap: 4 — ABNORMAL LOW (ref 5–15)
BUN: 8 mg/dL (ref 6–20)
CO2: 29 mmol/L (ref 22–32)
Calcium: 9.8 mg/dL (ref 8.9–10.3)
Chloride: 106 mmol/L (ref 98–111)
Creatinine: 0.57 mg/dL (ref 0.44–1.00)
GFR, Estimated: >60 mL/min (ref >60)
Glucose, Bld: 97 mg/dL (ref 70–99)
Potassium: 3.7 mmol/L (ref 3.5–5.1)
Sodium: 139 mmol/L (ref 135–145)
Total Bilirubin: 0.6 mg/dL (ref 0.3–1.2)
Total Protein: 7.4 g/dL (ref 6.5–8.1)

## 2022-07-21 MED ORDER — ATROPINE SULFATE 1 MG/ML IV SOLN
0.5000 mg | Freq: Once | INTRAVENOUS | Status: AC | PRN
  Administered 2022-07-21: 0.5 mg via INTRAVENOUS
  Filled 2022-07-21: qty 1

## 2022-07-21 MED ORDER — SODIUM CHLORIDE 0.9 % IV SOLN
10.0000 mg | Freq: Once | INTRAVENOUS | Status: AC
  Administered 2022-07-21: 10 mg via INTRAVENOUS
  Filled 2022-07-21: qty 10

## 2022-07-21 MED ORDER — PALONOSETRON HCL INJECTION 0.25 MG/5ML
0.2500 mg | Freq: Once | INTRAVENOUS | Status: AC
  Administered 2022-07-21: 0.25 mg via INTRAVENOUS
  Filled 2022-07-21: qty 5

## 2022-07-21 MED ORDER — SODIUM CHLORIDE 0.9 % IV SOLN
150.0000 mg/m2 | Freq: Once | INTRAVENOUS | Status: AC
  Administered 2022-07-21: 260 mg via INTRAVENOUS
  Filled 2022-07-21: qty 13

## 2022-07-21 MED ORDER — SODIUM CHLORIDE 0.9% FLUSH
10.0000 mL | Freq: Once | INTRAVENOUS | Status: AC
  Administered 2022-07-21: 10 mL

## 2022-07-21 MED ORDER — SODIUM CHLORIDE 0.9 % IV SOLN
150.0000 mg | Freq: Once | INTRAVENOUS | Status: AC
  Administered 2022-07-21: 150 mg via INTRAVENOUS
  Filled 2022-07-21: qty 150

## 2022-07-21 MED ORDER — DEXTROSE 5 % IV SOLN: Freq: Once | INTRAVENOUS | Status: AC

## 2022-07-21 MED ORDER — SODIUM CHLORIDE 0.9 % IV SOLN
2400.0000 mg/m2 | INTRAVENOUS | Status: DC
  Administered 2022-07-21: 4250 mg via INTRAVENOUS
  Filled 2022-07-21: qty 85

## 2022-07-21 MED ORDER — OXALIPLATIN CHEMO INJECTION 100 MG/20ML
85.0000 mg/m2 | Freq: Once | INTRAVENOUS | Status: AC
  Administered 2022-07-21: 150 mg via INTRAVENOUS
  Filled 2022-07-21: qty 24.42

## 2022-07-21 MED ORDER — SODIUM CHLORIDE 0.9 % IV SOLN
400.0000 mg/m2 | Freq: Once | INTRAVENOUS | Status: AC
  Administered 2022-07-21: 712 mg via INTRAVENOUS
  Filled 2022-07-21: qty 17.5

## 2022-07-21 NOTE — Progress Notes (Signed)
[facility] [phone]   [phone]  PROGRESS NOTE:  Patient Care Team: Angelica Chessman, MD as PCP - General (Family Medicine) Reece Packer, MD as Consulting Physician (Medical Oncology) Pickenpack-Cousar, Arty Baumgartner, NP as Nurse Practitioner (Nurse Practitioner)  CHIEF COMPLAINTS/PURPOSE OF CONSULTATION:  Metastatic pancreatic adenocarcinoma involving the liver  ONCOLOGIC HISTORY: Presented with nausea and right upper quadrant pain 03/08/2022: Abdominal US showed multiple hypoechoic masses within the liver are indeterminate.  03/10/2022: MR abdomen: Hypoenhancing mass compatible with pancreatic head adenocarcinoma with numerous targetoid enhancing masses in all segments of the liver, compatible with metastatic disease. The mass severely effaces and nearly occludes the portal vein and splenic vein, and occludes the superior mesenteric vein with some collaterals noted. The mass substantially abuts the proximal transverse duodenum, although overt duodenal invasion is indeterminate. 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 Prominent stool throughout the colon favors constipation. Fluid-fluid level in the gallbladder likely from sludge. 03/14/2022: Establish care with [facility] Hematology/Oncology 03/17/2022: CT chest: no definitive evidence of lung metastases.  03/22/2022: Liver biopsy confirmed metastatic adenocarcinoma, pancreatic primary.  03/30/2022: Cycle 1, Day 1 of mFOLFIRINOX 04/14/2022: Cycle 2, Day 1 of mFOLFIRINOX HELD due to ANC 300.  04/20/2022: Cycle 2, Day 1 of mFOLFIRINOX 05/04/2022: Cycle 3, Day 1 of  mFOLFIRINOX 05/19/2022: Cycle 4, Day 1 of mFOLFIRINOX HELD per patient request.   HISTORY OF PRESENTING ILLNESS:  Kaitlin Ferrell presents today for a follow up. She was last seen by Dr. Leonides Schanz on 07/13/2022. In the interim, she has continued on a treatment break.   Ms. Mckeen reports she has had no changes in her health in the interim since we last spoke.  She continues to eat well and hydrate well.  She notes that she is mentally ready to start back up on the full-strength FOLFIRINOX chemotherapy.  We reiterated the different options moving forward but this continues to be her preference.  Overall she feels like she is steadily improving. She denies fevers, chills, sweats, shortness of breath, chest pain or cough. She has no other complaints. Rest of the 10 point ROS is below.   Today she is accompanied by her husband.  We assured that she had everything necessary to start her treatment.  We covered any last minute questions and concerns.  Today we will proceed with full dose FOLFIRINOX chemotherapy as requested by the patient.  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   treated with 4 cycles of actinomycin D from 12-02-11 thru 01-13-12   H/O molar pregnancy, antepartum    Hypertension    Migraine    Miscarriage     SURGICAL HISTORY: Past Surgical History:  Procedure Laterality Date   BREAST BIOPSY  2001   Left   BREAST LUMPECTOMY     DILATION AND CURETTAGE OF UTERUS  1992, 09/2011   DILATION AND EVACUATION  2011   INSERTION OF MESH N/A 09/03/2013   Procedure: INSERTION OF MESH;  Surgeon: Ardeth Sportsman, MD;  Location: [facility];  Service: General;  Laterality: N/A;   IR CHEST FLUORO  04/14/2022   IR IMAGING GUIDED PORT INSERTION  03/22/2022   IR IMAGING GUIDED PORT INSERTION  05/02/2022    IR PORT REPAIR CENTRAL VENOUS ACCESS DEVICE  04/19/2022   IR REMOVAL TUN CV CATH W/O FL  05/02/2022   IR US LIVER BIOPSY  03/22/2022   PORTACATH PLACEMENT  11/2011   PORTACATH PLACEMENT     REMOVAL  MARCH 2014   VENTRAL HERNIA REPAIR N/A 09/03/2013   Procedure: LAPAROSCOPIC VENTRAL WALL HERNIA REPAIR;  Surgeon: Ardeth Sportsman, MD;  Location: [facility];  Service: General;  Laterality: N/A;   WISDOM TOOTH EXTRACTION     in 11th grade    SOCIAL HISTORY: Social History   Socioeconomic History   Marital status: Married    Spouse name: [name]   Number of children: 2   Years of education: college   Highest education level: Not on file  Occupational History    Comment: C[employer]  Tobacco Use   Smoking status: Never   Smokeless tobacco: Never  Substance and Sexual Activity   Alcohol use: Yes    Alcohol/week: 1.0 standard drink of alcohol    Types: 1 Glasses of wine per week    Comment: occas   Drug use: No   Sexual activity: Not Currently    Birth control/protection: Pill  Other Topics Concern   Not on file  Social History Narrative   Patient is married [name]). Patient works part time at [employer].   Right handed.   Patient has her CMA.   Caffeine- None         Social Determinants of Health   Financial Resource Strain: Not on file  Food Insecurity: No Food Insecurity (05/10/2022)   Hunger Vital Sign    Worried About Running Out of Food in the Last Year: Never true    Ran Out of Food in the Last Year: Never true  Transportation Needs: No Transportation Needs (05/10/2022)   PRAPARE - Administrator, Civil Service (Medical): No    Lack of Transportation (Non-Medical): No  Physical Activity: Not on file  Stress: Not on file  Social Connections: Not on file  Intimate Partner Violence: Not At Risk (05/10/2022)   Humiliation, Afraid, Rape, and Kick questionnaire    Fear of Current or Ex-Partner: No    Emotionally Abused: No    Physically Abused: No    Sexually  Abused: No    FAMILY HISTORY: Family History  Problem Relation Age of Onset   Breast cancer Mother        dx > 50   Heart attack Mother    Diabetes Father    Breast cancer Maternal Aunt        dx > 50   Breast cancer Maternal Aunt        dx > 50   Breast cancer Maternal Grandmother        ? < 50   Stroke Paternal Grandmother    Alcoholism Other     ALLERGIES:  is allergic to compazine [prochlorperazine edisylate], propofol, oxycodone-acetaminophen, prochlorperazine, and labetalol.  MEDICATIONS:  Current Outpatient Medications  Medication Sig Dispense Refill   OLANZapine (ZYPREXA) 10 MG tablet TAKE 1 TABLET BY MOUTH EVERYDAY AT BEDTIME 90 tablet 1   amLODipine (NORVASC) 10 MG tablet Take 1 tablet by mouth daily.     apixaban (ELIQUIS) 5 MG TABS tablet Take 1 tablet (  5 mg total) by mouth 2 (two) times daily. 60 tablet 2   Cholecalciferol 1.25 MG (50000 UT) capsule Take 50,000 Units by mouth once a week.     citalopram (CELEXA) 10 MG tablet Take by mouth. (Patient not taking: Reported on 06/15/2022)     Cyanocobalamin (VITAMIN B-12 CR PO) Take by mouth.     diphenoxylate-atropine (LOMOTIL) 2.5-0.025 MG tablet Take 1 tablet by mouth 4 (four) times daily as needed for diarrhea or loose stools. 30 tablet 0   ELDERBERRY PO Take by mouth.     eletriptan (RELPAX) 40 MG tablet Take 1 tablet (40 mg total) by mouth as needed for migraine or headache. May repeat in 2 hours if needed. 15 tablet 6   FIBER ADULT GUMMIES PO Take by mouth.     Folic Acid (FOLATE PO) Place 1 mg under the tongue daily.     hydrochlorothiazide (MICROZIDE) 12.5 MG capsule Take 12.5 mg by mouth daily as needed (swelling). (Patient not taking: Reported on 06/15/2022)     lidocaine-prilocaine (EMLA) cream Apply 1 Application topically as needed. 30 g 0   LORazepam (ATIVAN) 0.5 MG tablet Take 0.5 mg by mouth as needed for anxiety.     magic mouthwash (multi-ingredient) oral suspension Swish and swallow 5 mLs by mouth 4  times daily as needed for throat discomfort 400 mL 1   magnesium oxide (MAG-OX) 400 (241.3 MG) MG tablet TAKE 1 TABLET BY MOUTH TWICE A DAY 60 tablet 6   morphine (MS CONTIN) 30 MG 12 hr tablet Take 1 tablet (30 mg total) by mouth every 8 (eight) hours. 90 tablet 0   morphine (MSIR) 15 MG tablet Take 1 tablet (15 mg total) by mouth every 6 (six) hours as needed for severe pain or moderate pain. 45 tablet 0   ondansetron (ZOFRAN) 8 MG tablet Take 1 tablet (8 mg total) by mouth every 8 (eight) hours as needed for nausea or vomiting. 90 tablet 0   ondansetron (ZOFRAN-ODT) 8 MG disintegrating tablet Take 1 tablet (8 mg total) by mouth every 8 (eight) hours as needed for nausea or vomiting. 20 tablet 0   promethazine (PHENERGAN) 25 MG tablet Take 1 tablet (25 mg total) by mouth every 6 (six) hours as needed for nausea or vomiting. 30 tablet 0   scopolamine (TRANSDERM-SCOP) 1 MG/3DAYS Place 1 patch (1.5 mg total) onto the skin every 3 (three) days. 10 patch 12   No current facility-administered medications for this visit.    REVIEW OF SYSTEMS:   Constitutional: ( - ) fevers, ( - )  chills , ( - ) night sweats Eyes: ( - ) blurriness of vision, ( - ) double vision, ( - ) watery eyes Ears, nose, mouth, throat, and face: ( - ) mucositis, ( - ) sore throat Respiratory: ( - ) cough, ( - ) dyspnea, ( - ) wheezes Cardiovascular: ( - ) palpitation, ( - ) chest discomfort, ( - ) lower extremity swelling Gastrointestinal:  (-) nausea, ( - ) heartburn, ( -) change in bowel habits Skin: ( - ) abnormal skin rashes Lymphatics: ( - ) new lymphadenopathy, ( - ) easy bruising Neurological: ( - ) numbness, ( - ) tingling, ( - ) new weaknesses Behavioral/Psych: ( - ) mood change, ( - ) new changes  All other systems were reviewed with the patient and are negative.  PHYSICAL EXAMINATION: ECOG PERFORMANCE STATUS: 1 - Symptomatic but completely ambulatory  Vitals:   07/21/22 1014  BP:  134/85  Pulse: 79  Resp: 15   Temp: (!) 97.5 F (36.4 C)  SpO2: 100%   Filed Weights   07/21/22 1014  Weight: 137 lb 11.2 oz (62.5 kg)    GENERAL: well appearing female in NAD  SKIN: skin color, texture, turgor are normal, no rashes or significant lesions EYES: conjunctiva are pink and non-injected, sclera clear LUNGS: clear to auscultation and percussion with normal breathing effort HEART: regular rate & rhythm and no murmurs and no lower extremity edema ABDOMEN:  normal bowel sounds. Abdomen is tender to palpation in epigastric and RUQ region. No palpable masses or hepatomegaly appreciated.  Musculoskeletal: no cyanosis of digits and no clubbing  PSYCH: alert & oriented x 3, fluent speech NEURO: no focal motor/sensory deficits  LABORATORY DATA:  I have reviewed the data as listed    Latest Ref Rng & Units 07/21/2022    9:51 AM 07/13/2022    2:58 PM 06/27/2022   12:36 PM  CBC  WBC 4.0 - 10.5 K/uL 4.3  3.9  3.8   Hemoglobin 12.0 - 15.0 g/dL 13.0  9.8  86.5   Hematocrit 36.0 - 46.0 % 30.9  30.9  31.9   Platelets 150 - 400 K/uL 173  183  188        Latest Ref Rng & Units 07/21/2022    9:51 AM 07/13/2022    2:58 PM 06/27/2022   12:36 PM  CMP  Glucose 70 - 99 mg/dL 97  90  784   BUN 6 - 20 mg/dL 8  9  9    Creatinine 0.44 - 1.00 mg/dL 6.96  2.95  2.84   Sodium 135 - 145 mmol/L 139  137  139   Potassium 3.5 - 5.1 mmol/L 3.7  3.6  4.0   Chloride 98 - 111 mmol/L 106  103  104   CO2 22 - 32 mmol/L 29  26  29    Calcium 8.9 - 10.3 mg/dL 9.8  9.3  9.6   Total Protein 6.5 - 8.1 g/dL 7.4  7.0  7.3   Total Bilirubin 0.3 - 1.2 mg/dL 0.6  0.6  0.4   Alkaline Phos 38 - 126 U/L 141  103  79   AST 15 - 41 U/L 31  28  21    ALT 0 - 44 U/L 28  24  19       RADIOGRAPHIC STUDIES: I have personally reviewed the radiological images as listed and agreed with the findings in the report. No results found.  ASSESSMENT & PLAN Arrion Burruel is a 57 y.o. female who returns to the clinic for newly diagnosed  pancreatic adenocarcinoma.    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 Dr. Leonides Schanz recommends chemotherapy regimen with FOLFIRINOX q 2 weeks.  --Plan to obtain restaging CT scans every 3 months to assess treatment response.  --[facility] Testing: MS-stable, 2 Muts/Mb, ATMH657fs*31, KRASG12L, MDM2 amplification, CDKN2A/B. No targetable mutations.  --Started mFOLFIRINOX on 03/30/2022 PLAN: --Last dose was Cycle 3 on 05/04/2022. Due to poor tolerance and patient's request, she has been on a treatment break until present.  --Labs from today reviewed with patient.  White blood cell 4.3, hemoglobin 10.1, MCV 90.9, and platelets of 173 --at last visit discussed options with patient moving forward.  We could consider either dose reducing the  FOLFIRINOX, transitioning to gem/abraxane, or discontinuing chemotherapy altogether.  --Patient would like to restart full-strength FOLFIRINOX chemotherapy. Will resume as Cycle 4 of FOLFIRINOX.  We had a detailed discussion about the risks and benefits of this approach.Will proceed with Cycle 4 today.  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  --Currently on Eliquis 5 mg BID  #Neutropenia--resolved --Due to critical levels with ANC 300, gave one dose of Zarzio 300 mcg in clinic  --Defered treatment by one week and plan to give Udenyca on Day 3 starting with Cycle 2.  --Neutropenic precautions given to patient  including monitoring for fevers.   Elevated LFTs: --ALT 28, AST 31. Normalized.  --Etiology includes chemotherapy induced, liver metastases versus increased acetaminophen use. --Monitor for now  Nausea/Vomiting: --Secondary to chemotherapy, resolved with treatment holiday -- Continue to take scopolamine patch, zofran 8mg  q8H PRN for nausea, zyprexa nightly PRN for nausea.   #Epigastric/RUQ/back pain: --Likely secondary to underlying malignancy --Tramadol was ineffective.  --Current pain regimen includes MS contin to 30 mg q 12 hours and Norco 10-325 mg.   #Appetite loss/weight loss: --Improved with treatment holiday  #Family history of breast cancer: --Due to strong family history of cancer and has undergone genetic testing in the past.  --Underwent genetic testing on 04/07/2022 that was negative. 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   #Supportive Care -- chemotherapy education to be scheduled  -- port placement complete  -- EMLA cream for port  Orders Placed This Encounter  Procedures   CBC with Differential (Cancer Center Only)    Standing Status:   Future    Standing Expiration Date:   08/18/2023   CMP (Cancer Center only)    Standing Status:   Future    Standing Expiration Date:   08/18/2023   CBC with Differential (Cancer Center Only)    Standing Status:   Future    Standing Expiration Date:   09/01/2023   CMP (Cancer Center only)    Standing Status:   Future    Standing Expiration Date:   09/01/2023   CBC with Differential (Cancer Center Only)    Standing Status:   Future    Standing Expiration Date:   09/15/2023   CMP (Cancer Center only)    Standing Status:   Future    Standing Expiration Date:   09/15/2023   CBC with Differential (Cancer Center Only)    Standing Status:   Future    Standing Expiration Date:   09/29/2023   CMP (Cancer Center only)    Standing Status:   Future    Standing Expiration Date:   09/29/2023   CBC with  Differential (Cancer Center Only)    Standing Status:   Future    Standing Expiration Date:   10/13/2023   CMP (Cancer Center only)    Standing Status:   Future    Standing Expiration Date:   10/13/2023   CBC with Differential (Cancer Center Only)    Standing Status:   Future    Standing Expiration Date:   10/27/2023   CMP (Cancer Center only)    Standing Status:   Future    Standing Expiration Date:   10/27/2023  All questions were answered. The patient knows to call the clinic with any problems, questions or concerns.  I have spent a total of 25 minutes minutes of face-to-face and non-face-to-face time, preparing to see the patient,  performing a medically appropriate examination, counseling and educating the patient, documenting clinical information in the electronic health record,  and care coordination.   Ulysees Barns, MD Department of Hematology/Oncology [facility] Phone: [phone] Pager: [phone] Email: [email or web address]

## 2022-07-21 NOTE — Patient Instructions (Signed)
[place] [facility]  Discharge Instructions: Thank you for choosing [facility] to provide your oncology and hematology care.   If you have a lab appointment with the Cancer Center, please go directly to the Cancer Center and check in at the registration area.   Wear comfortable clothing and clothing appropriate for easy access to any Portacath or PICC line.   We strive to give you quality time with your provider. You may need to reschedule your appointment if you arrive late (15 or more minutes).  Arriving late affects you and other patients whose appointments are after yours.  Also, if you miss three or more appointments without notifying the office, you may be dismissed from the clinic at the provider's discretion.      For prescription refill requests, have your pharmacy contact our office and allow 72 hours for refills to be completed.    Today you received the following chemotherapy and/or immunotherapy agents Oxaliplatin, Irinotecan, Leukovorin, Flurouracil       To help prevent nausea and vomiting after your treatment, we encourage you to take your nausea medication as directed.  BELOW ARE SYMPTOMS THAT SHOULD BE REPORTED IMMEDIATELY: *FEVER GREATER THAN 100.4 F (38 C) OR HIGHER *CHILLS OR SWEATING *NAUSEA AND VOMITING THAT IS NOT CONTROLLED WITH YOUR NAUSEA MEDICATION *UNUSUAL SHORTNESS OF BREATH *UNUSUAL BRUISING OR BLEEDING *URINARY PROBLEMS (pain or burning when urinating, or frequent urination) *BOWEL PROBLEMS (unusual diarrhea, constipation, pain near the anus) TENDERNESS IN MOUTH AND THROAT WITH OR WITHOUT PRESENCE OF ULCERS (sore throat, sores in mouth, or a toothache) UNUSUAL RASH, SWELLING OR PAIN  UNUSUAL VAGINAL DISCHARGE OR ITCHING   Items with * indicate a potential emergency and should be followed up as soon as possible or go to the Emergency Department if any problems should occur.  Please show the CHEMOTHERAPY ALERT  CARD or IMMUNOTHERAPY ALERT CARD at check-in to the Emergency Department and triage nurse.  Should you have questions after your visit or need to cancel or reschedule your appointment, please contact [place] [facility]  Dept: [phone]  and follow the prompts.  Office hours are 8:00 a.m. to 4:30 p.m. Monday - Friday. Please note that voicemails left after 4:00 p.m. may not be returned until the following business day.  We are closed weekends and major holidays. You have access to a nurse at all times for urgent questions. Please call the main number to the clinic Dept: [phone] and follow the prompts.   For any non-urgent questions, you may also contact your provider using MyChart. We now offer e-Visits for anyone 105 and older to request care online for non-urgent symptoms. For details visit [email or web address].   Also download the MyChart app! Go to the app store, search "MyChart", open the app, select , and log in with your MyChart username and password.

## 2022-07-22 ENCOUNTER — Other Ambulatory Visit: Payer: Self-pay | Admitting: Physician Assistant

## 2022-07-22 ENCOUNTER — Encounter: Payer: Self-pay | Admitting: Physician Assistant

## 2022-07-22 MED ORDER — SCOPOLAMINE 1 MG/3DAYS TD PT72: 1.0000 | MEDICATED_PATCH | TRANSDERMAL | 12 refills | Status: DC

## 2022-07-22 MED FILL — Promethazine HCl Inj 25 MG/ML: INTRAMUSCULAR | Qty: 0.5 | Status: AC

## 2022-07-23 ENCOUNTER — Inpatient Hospital Stay: Payer: BC Managed Care – PPO

## 2022-07-23 VITALS — BP 148/69 | HR 54 | Temp 100.5°F

## 2022-07-23 DIAGNOSIS — R112 Nausea with vomiting, unspecified: Secondary | ICD-10-CM | POA: Diagnosis not present

## 2022-07-23 DIAGNOSIS — I10 Essential (primary) hypertension: Secondary | ICD-10-CM | POA: Diagnosis not present

## 2022-07-23 DIAGNOSIS — I82A11 Acute embolism and thrombosis of right axillary vein: Secondary | ICD-10-CM | POA: Diagnosis not present

## 2022-07-23 DIAGNOSIS — R197 Diarrhea, unspecified: Secondary | ICD-10-CM | POA: Diagnosis not present

## 2022-07-23 DIAGNOSIS — G893 Neoplasm related pain (acute) (chronic): Secondary | ICD-10-CM | POA: Diagnosis not present

## 2022-07-23 DIAGNOSIS — I81 Portal vein thrombosis: Secondary | ICD-10-CM | POA: Diagnosis not present

## 2022-07-23 DIAGNOSIS — Z7901 Long term (current) use of anticoagulants: Secondary | ICD-10-CM | POA: Diagnosis not present

## 2022-07-23 DIAGNOSIS — C250 Malignant neoplasm of head of pancreas: Secondary | ICD-10-CM | POA: Diagnosis not present

## 2022-07-23 DIAGNOSIS — Z452 Encounter for adjustment and management of vascular access device: Secondary | ICD-10-CM | POA: Diagnosis not present

## 2022-07-23 DIAGNOSIS — Z5111 Encounter for antineoplastic chemotherapy: Secondary | ICD-10-CM | POA: Diagnosis not present

## 2022-07-23 DIAGNOSIS — R7989 Other specified abnormal findings of blood chemistry: Secondary | ICD-10-CM | POA: Diagnosis not present

## 2022-07-23 DIAGNOSIS — C787 Secondary malignant neoplasm of liver and intrahepatic bile duct: Secondary | ICD-10-CM | POA: Diagnosis not present

## 2022-07-23 DIAGNOSIS — R630 Anorexia: Secondary | ICD-10-CM | POA: Diagnosis not present

## 2022-07-23 DIAGNOSIS — Z515 Encounter for palliative care: Secondary | ICD-10-CM | POA: Diagnosis not present

## 2022-07-23 DIAGNOSIS — Z5189 Encounter for other specified aftercare: Secondary | ICD-10-CM | POA: Diagnosis not present

## 2022-07-23 DIAGNOSIS — C259 Malignant neoplasm of pancreas, unspecified: Secondary | ICD-10-CM

## 2022-07-23 MED ORDER — PEGFILGRASTIM-CBQV 6 MG/0.6ML ~~LOC~~ SOSY
6.0000 mg | PREFILLED_SYRINGE | Freq: Once | SUBCUTANEOUS | Status: AC
  Administered 2022-07-23: 6 mg via SUBCUTANEOUS

## 2022-07-23 MED ORDER — SODIUM CHLORIDE 0.9% FLUSH
10.0000 mL | INTRAVENOUS | Status: DC | PRN
  Administered 2022-07-23: 10 mL

## 2022-07-23 MED ORDER — HEPARIN SOD (PORK) LOCK FLUSH 100 UNIT/ML IV SOLN
500.0000 [IU] | Freq: Once | INTRAVENOUS | Status: AC | PRN
  Administered 2022-07-23: 500 [IU]

## 2022-07-29 DIAGNOSIS — C259 Malignant neoplasm of pancreas, unspecified: Secondary | ICD-10-CM | POA: Diagnosis not present

## 2022-07-29 NOTE — Progress Notes (Unsigned)
Palliative Medicine [facility]  Telephone:(336) [phone] Fax:(336) [phone]   Name: Kaitlin Ferrell Date: 07/29/2022 MRN: [id number]  DOB: May 27, 1965  Patient Care Team: Angelica Chessman, MD as PCP - General (Family Medicine) Reece Packer, MD as Consulting Physician (Medical Oncology) Pickenpack-Cousar, Arty Baumgartner, NP as Nurse Practitioner (Nurse Practitioner)    INTERVAL HISTORY: Kaitlin Ferrell is a 57 y.o. 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 Other history includes gestational trophoblastic disease, insomnia, HTN, and migraines. Palliative ask to see for symptom and pain management and goals of care.   SOCIAL HISTORY:     reports that she has never smoked. She has never used smokeless tobacco. She reports current alcohol use of about 1.0 standard drink of alcohol per week. She reports that she does not use drugs.  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  MEDICATIONS:  Current Outpatient Medications  Medication Sig Dispense Refill   amLODipine (NORVASC) 10 MG tablet Take 1 tablet by mouth daily.     apixaban (ELIQUIS) 5 MG TABS tablet Take 1 tablet (5 mg total) by mouth 2 (two) times daily. 60 tablet 2   Cholecalciferol 1.25 MG (50000 UT) capsule Take 50,000 Units by mouth once a week.     citalopram (CELEXA) 10  MG tablet Take by mouth. (Patient not taking: Reported on 06/15/2022)     Cyanocobalamin (VITAMIN B-12 CR PO) Take by mouth.     diphenoxylate-atropine (LOMOTIL) 2.5-0.025 MG tablet Take 1 tablet by mouth 4 (four) times daily as needed for diarrhea or loose stools. 30 tablet 0   ELDERBERRY PO Take by mouth.     eletriptan (RELPAX) 40 MG tablet Take 1 tablet (40 mg total) by mouth as needed for migraine or headache. May repeat in 2 hours if needed. 15 tablet 6   FIBER ADULT GUMMIES PO Take by mouth.     Folic Acid (FOLATE PO) Place 1 mg under the tongue daily.     hydrochlorothiazide (MICROZIDE) 12.5 MG capsule Take 12.5 mg by mouth daily as needed (swelling). (Patient not taking: Reported on 06/15/2022)     lidocaine-prilocaine (EMLA) cream Apply 1 Application topically as needed. 30 g 0   LORazepam (ATIVAN) 0.5 MG tablet Take 0.5 mg by mouth as needed for anxiety.     magic mouthwash (multi-ingredient) oral suspension Swish and swallow 5 mLs by mouth 4 times daily as needed for throat discomfort 400 mL 1   magnesium oxide (MAG-OX) 400 (241.3 MG) MG tablet TAKE 1 TABLET BY MOUTH TWICE A DAY 60 tablet 6   morphine (MS CONTIN) 30 MG 12 hr tablet Take 1 tablet (30 mg total) by mouth every 8 (eight) hours. 90 tablet 0   morphine (MSIR) 15 MG tablet Take 1 tablet (15 mg total) by mouth every 6 (six) hours as needed for severe pain or moderate pain. 45 tablet 0  OLANZapine (ZYPREXA) 10 MG tablet TAKE 1 TABLET BY MOUTH EVERYDAY AT BEDTIME 90 tablet 1   ondansetron (ZOFRAN) 8 MG tablet Take 1 tablet (8 mg total) by mouth every 8 (eight) hours as needed for nausea or vomiting. 90 tablet 0   ondansetron (ZOFRAN-ODT) 8 MG disintegrating tablet Take 1 tablet (8 mg total) by mouth every 8 (eight) hours as needed for nausea or vomiting. 20 tablet 0   promethazine (PHENERGAN) 25 MG tablet Take 1 tablet (25 mg total) by mouth every 6 (six) hours as needed for nausea or vomiting. 30 tablet 0   scopolamine  (TRANSDERM-SCOP) 1 MG/3DAYS Place 1 patch (1.5 mg total) onto the skin every 3 (three) days. 10 patch 12   No current facility-administered medications for this visit.    VITAL SIGNS: LMP 06/26/2013 Comment: low dose BCP There were no vitals filed for this visit.  Estimated body mass index is 25.19 kg/m as calculated from the following:   Height as of 06/27/22: 5\' 2"  (1.575 m).   Weight as of 07/21/22: 137 lb 11.2 oz (62.5 kg).   PERFORMANCE STATUS (ECOG) : 1 - Symptomatic but completely ambulatory   Physical Exam General: NAD Cardiovascular: regular rate  Pulmonary: normal breathing pattern  Abdomen: soft, nontender, + bowel sounds Extremities: no edema, no joint deformities Skin: no rashes Neurological: AAO x4  IMPRESSION: Kaitlin Ferrell presents to clinic today for symptom management follow-up. Husband is present. No acute distress. She is doing well overall. Appetite is good. Denies nausea, vomiting, uncontrolled constipation, or diarrhea. A few days of nausea associated with treatment but otherwise of no concern. Is trying to remain as active as possible.   Neoplasm related pain Kaitlin Ferrell reports pain well controlled on current regimen. She is much appreciative of this.    We discussed her current regimen. She is taking MS Contin 30 mg every 8 hours and MS IR as needed. Not requiring breakthrough medication around the clock. Given improvement no changes to regimen at this time.    We will continue to closely monitor and adjust as needed.    Goals of Care   07/07/22- We discussed her current illness and what it means in the larger context of her on-going co-morbidities. Natural disease trajectory and expectations were discussed.   Patient and husband are realistic in their understanding of her cancer diagnosis and trajectory. She shares they have also discussed at length with her children. Patient states she chose to pursue chemotherapy break due to significance of her  symptoms. She was extremely fatigue, weak, could not get out of bed, no appetite, weight loss, nausea, vomiting, and diarrhea. Kaitlin Ferrell shares she was unable to perform ADLs and husband was having to bathe her. They were concerned that she was facing end-of-life given how poor her quality of life was. Patient and husband speaks to appreciation of her improvement in quality of life over the past several weeks.    I created space and opportunity for patient and husband to discuss goals and hopes. Patient is trying to focus on healthy food options and be as active as she can. They speak to plans of watchful waiting to see what upcoming CT scan results show in terms of her cancer. She will then consider if she would like to pursue additional treatment options versus not. They are clear in their understanding what pros and cons are of pursing or not to pursue further treatment including end-of-life. Kaitlin Ferrell speaks to her quality of life is most important  over quantity. She would not want to spend last moments in a stupor suffering state. Emotional support provided.   We discussed Her current illness and what it means in the larger context of Her on-going co-morbidities. Natural disease trajectory and expectations were discussed.  I discussed the importance of continued conversation with family and their medical providers regarding overall plan of care and treatment options, ensuring decisions are within the context of the patients values and GOCs.  PLAN:  MS Contin 15mg  every 8 hours MS IR for breakthrough pain Senna-S for bowel regimen.  Extensive goals of care discussions. Ongoing support.  I will plan to see patient back in 3-4 weeks in collaboration to other oncology appointments.    Patient expressed understanding and was in agreement with this plan. She also understands that She can call the clinic at any time with any questions, concerns, or complaints.   Any controlled substances utilized were  prescribed in the context of palliative care. PDMP has been reviewed.    Visit consisted of counseling and education dealing with the complex and emotionally intense issues of symptom management and palliative care in the setting of serious and potentially life-threatening illness.Greater than 50%  of this time was spent counseling and coordinating care related to the above assessment and plan.  Willette Alma, AGPCNP-BC  Palliative Medicine Team/[facility]  *Please note that this is a verbal dictation therefore any spelling or grammatical errors are due to the "Dragon Medical One" system interpretation.

## 2022-07-31 ENCOUNTER — Encounter: Payer: Self-pay | Admitting: Hematology and Oncology

## 2022-08-03 MED FILL — Dexamethasone Sodium Phosphate Inj 100 MG/10ML: INTRAMUSCULAR | Qty: 1 | Status: AC

## 2022-08-03 MED FILL — Fosaprepitant Dimeglumine For IV Infusion 150 MG (Base Eq): INTRAVENOUS | Qty: 5 | Status: AC

## 2022-08-04 ENCOUNTER — Inpatient Hospital Stay (HOSPITAL_BASED_OUTPATIENT_CLINIC_OR_DEPARTMENT_OTHER): Payer: BC Managed Care – PPO | Admitting: Physician Assistant

## 2022-08-04 ENCOUNTER — Inpatient Hospital Stay (HOSPITAL_BASED_OUTPATIENT_CLINIC_OR_DEPARTMENT_OTHER): Payer: BC Managed Care – PPO | Admitting: Nurse Practitioner

## 2022-08-04 ENCOUNTER — Other Ambulatory Visit: Payer: Self-pay

## 2022-08-04 ENCOUNTER — Inpatient Hospital Stay: Payer: BC Managed Care – PPO

## 2022-08-04 ENCOUNTER — Other Ambulatory Visit: Payer: Self-pay | Admitting: Hematology and Oncology

## 2022-08-04 ENCOUNTER — Encounter: Payer: Self-pay | Admitting: Nurse Practitioner

## 2022-08-04 ENCOUNTER — Other Ambulatory Visit: Payer: Self-pay | Admitting: *Deleted

## 2022-08-04 VITALS — BP 143/88 | HR 86 | Temp 98.3°F | Resp 16 | Ht 62.0 in | Wt 132.2 lb

## 2022-08-04 DIAGNOSIS — I82A11 Acute embolism and thrombosis of right axillary vein: Secondary | ICD-10-CM | POA: Diagnosis not present

## 2022-08-04 DIAGNOSIS — Z5111 Encounter for antineoplastic chemotherapy: Secondary | ICD-10-CM | POA: Diagnosis not present

## 2022-08-04 DIAGNOSIS — R630 Anorexia: Secondary | ICD-10-CM | POA: Diagnosis not present

## 2022-08-04 DIAGNOSIS — C787 Secondary malignant neoplasm of liver and intrahepatic bile duct: Secondary | ICD-10-CM

## 2022-08-04 DIAGNOSIS — G893 Neoplasm related pain (acute) (chronic): Secondary | ICD-10-CM | POA: Diagnosis not present

## 2022-08-04 DIAGNOSIS — Z7901 Long term (current) use of anticoagulants: Secondary | ICD-10-CM | POA: Diagnosis not present

## 2022-08-04 DIAGNOSIS — Z95828 Presence of other vascular implants and grafts: Secondary | ICD-10-CM

## 2022-08-04 DIAGNOSIS — R112 Nausea with vomiting, unspecified: Secondary | ICD-10-CM | POA: Diagnosis not present

## 2022-08-04 DIAGNOSIS — C259 Malignant neoplasm of pancreas, unspecified: Secondary | ICD-10-CM

## 2022-08-04 DIAGNOSIS — I10 Essential (primary) hypertension: Secondary | ICD-10-CM | POA: Diagnosis not present

## 2022-08-04 DIAGNOSIS — R197 Diarrhea, unspecified: Secondary | ICD-10-CM | POA: Diagnosis not present

## 2022-08-04 DIAGNOSIS — Z452 Encounter for adjustment and management of vascular access device: Secondary | ICD-10-CM | POA: Diagnosis not present

## 2022-08-04 DIAGNOSIS — Z515 Encounter for palliative care: Secondary | ICD-10-CM | POA: Diagnosis not present

## 2022-08-04 DIAGNOSIS — Z5189 Encounter for other specified aftercare: Secondary | ICD-10-CM | POA: Diagnosis not present

## 2022-08-04 DIAGNOSIS — R7989 Other specified abnormal findings of blood chemistry: Secondary | ICD-10-CM | POA: Diagnosis not present

## 2022-08-04 DIAGNOSIS — D696 Thrombocytopenia, unspecified: Secondary | ICD-10-CM

## 2022-08-04 DIAGNOSIS — I81 Portal vein thrombosis: Secondary | ICD-10-CM | POA: Diagnosis not present

## 2022-08-04 DIAGNOSIS — C250 Malignant neoplasm of head of pancreas: Secondary | ICD-10-CM | POA: Diagnosis not present

## 2022-08-04 LAB — CBC WITH DIFFERENTIAL (CANCER CENTER ONLY)
Abs Immature Granulocytes: 0.06 10*3/uL (ref 0.00–0.07)
Basophils Absolute: 0 10*3/uL (ref 0.0–0.1)
Basophils Relative: 1 %
Eosinophils Absolute: 0 10*3/uL (ref 0.0–0.5)
Eosinophils Relative: 0 %
HCT: 31.6 % — ABNORMAL LOW (ref 36.0–46.0)
Hemoglobin: 10.1 g/dL — ABNORMAL LOW (ref 12.0–15.0)
Immature Granulocytes: 1 %
Lymphocytes Relative: 33 %
Lymphs Abs: 1.7 10*3/uL (ref 0.7–4.0)
MCH: 29.1 pg (ref 26.0–34.0)
MCHC: 32 g/dL (ref 30.0–36.0)
MCV: 91.1 fL (ref 80.0–100.0)
Monocytes Absolute: 0.8 10*3/uL (ref 0.1–1.0)
Monocytes Relative: 15 %
Neutro Abs: 2.7 10*3/uL (ref 1.7–7.7)
Neutrophils Relative %: 50 %
Platelet Count: 81 10*3/uL — ABNORMAL LOW (ref 150–400)
RBC: 3.47 MIL/uL — ABNORMAL LOW (ref 3.87–5.11)
RDW: 13.3 % (ref 11.5–15.5)
WBC Count: 5.3 10*3/uL (ref 4.0–10.5)
nRBC: 0 % (ref 0.0–0.2)

## 2022-08-04 LAB — CMP (CANCER CENTER ONLY)
ALT: 106 U/L — ABNORMAL HIGH (ref 0–44)
AST: 73 U/L — ABNORMAL HIGH (ref 15–41)
Albumin: 3.9 g/dL (ref 3.5–5.0)
Alkaline Phosphatase: 329 U/L — ABNORMAL HIGH (ref 38–126)
Anion gap: 7 (ref 5–15)
BUN: 10 mg/dL (ref 6–20)
CO2: 29 mmol/L (ref 22–32)
Calcium: 9.6 mg/dL (ref 8.9–10.3)
Chloride: 105 mmol/L (ref 98–111)
Creatinine: 0.59 mg/dL (ref 0.44–1.00)
GFR, Estimated: >60 mL/min (ref >60)
Glucose, Bld: 96 mg/dL (ref 70–99)
Potassium: 3.4 mmol/L — ABNORMAL LOW (ref 3.5–5.1)
Sodium: 141 mmol/L (ref 135–145)
Total Bilirubin: 0.3 mg/dL (ref 0.3–1.2)
Total Protein: 7.4 g/dL (ref 6.5–8.1)

## 2022-08-04 LAB — FERRITIN: Ferritin: 377 ng/mL — ABNORMAL HIGH (ref 11–307)

## 2022-08-04 LAB — IRON AND IRON BINDING CAPACITY (CC-WL,HP ONLY)
Iron: 80 ug/dL (ref 28–170)
Saturation Ratios: 32 % — ABNORMAL HIGH (ref 10.4–31.8)
TIBC: 249 ug/dL — ABNORMAL LOW (ref 250–450)
UIBC: 169 ug/dL (ref 148–442)

## 2022-08-04 LAB — LACTATE DEHYDROGENASE: LDH: 206 U/L — ABNORMAL HIGH (ref 98–192)

## 2022-08-04 MED ORDER — SODIUM CHLORIDE 0.9% FLUSH
10.0000 mL | Freq: Once | INTRAVENOUS | Status: AC
  Administered 2022-08-04: 10 mL

## 2022-08-04 MED ORDER — HEPARIN SOD (PORK) LOCK FLUSH 100 UNIT/ML IV SOLN
500.0000 [IU] | Freq: Once | INTRAVENOUS | Status: AC
  Administered 2022-08-04: 500 [IU]

## 2022-08-04 MED ORDER — MORPHINE SULFATE ER 30 MG PO TBCR
30.0000 mg | EXTENDED_RELEASE_TABLET | Freq: Three times a day (TID) | ORAL | 0 refills | Status: DC
Start: 2022-08-07 — End: 2022-09-19

## 2022-08-04 NOTE — Progress Notes (Signed)
[facility] Telephone:(336) [phone]   Fax:(336) [phone]  PROGRESS NOTE:  Patient Care Team: Angelica Chessman, MD as PCP - General (Family Medicine) Reece Packer, MD as Consulting Physician (Medical Oncology) Pickenpack-Cousar, Arty Baumgartner, NP as Nurse Practitioner (Nurse Practitioner)  CHIEF COMPLAINTS/PURPOSE OF CONSULTATION:  Metastatic pancreatic adenocarcinoma involving the liver  ONCOLOGIC HISTORY: Presented with nausea and right upper quadrant pain 03/08/2022: Abdominal US showed multiple hypoechoic masses within the liver are indeterminate.  03/10/2022: MR abdomen: Hypoenhancing mass compatible with pancreatic head adenocarcinoma with numerous targetoid enhancing masses in all segments of the liver, compatible with metastatic disease. The mass severely effaces and nearly occludes the portal vein and splenic vein, and occludes the superior mesenteric vein with some collaterals noted. The mass substantially abuts the proximal transverse duodenum, although overt duodenal invasion is indeterminate. 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 Prominent stool throughout the colon favors constipation. Fluid-fluid level in the gallbladder likely from sludge. 03/14/2022: Establish care with [facility] Hematology/Oncology 03/17/2022: CT chest: no definitive evidence of lung metastases.  03/22/2022: Liver biopsy confirmed metastatic adenocarcinoma, pancreatic primary.  03/30/2022: Cycle 1, Day 1 of mFOLFIRINOX 04/14/2022: Cycle 2, Day 1 of mFOLFIRINOX HELD due to ANC 300.  04/20/2022: Cycle 2, Day 1 of mFOLFIRINOX 05/04/2022: Cycle 3, Day 1 of  mFOLFIRINOX 05/19/2022: Cycle 4, Day 1 of mFOLFIRINOX HELD per patient request.  07/21/2022: Cycle 4, Day 1 of mFOLFIRINOX 08/04/2022: Cycle 5, Day 1 of mFOLFIRINOX HELD due to Plt 81K   HISTORY OF PRESENTING ILLNESS:  Kaitlin Ferrell presents today for a follow up. She was last seen by Dr. Leonides Schanz on 07/21/2022. In the interim, she has resumed chemotherapy and presents today for Cycle 5, Day 1.   Kaitlin Ferrell reports she has overall tolerated the last chemotherapy. Her energy levels were stable and appetite is fair. She did loose 5 labs since the last visit. She is trying to eat frequently but tends to eat smaller quantity. She did have some nausea and vomiting which improved with her current antiemetics. She denies any bowel habit changes. She denies peripheral neuropathy or cold sensitivity. She denies fevers, chills, sweats, shortness of breath, chest pain or cough. She has no other complaints. Rest of the 10 point ROS is below.   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   treated with 4 cycles of actinomycin D from 12-02-11 thru 01-13-12   H/O molar pregnancy, antepartum    Hypertension    Migraine    Miscarriage     SURGICAL HISTORY: Past Surgical History:  Procedure Laterality Date   BREAST BIOPSY  2001   Left   BREAST LUMPECTOMY     DILATION AND CURETTAGE OF UTERUS  1992, 09/2011   DILATION AND EVACUATION  2011   INSERTION OF MESH N/A 09/03/2013   Procedure: INSERTION OF MESH;  Surgeon: Ardeth Sportsman, MD;  Location: [facility];  Service: General;  Laterality: N/A;   IR CHEST FLUORO  04/14/2022   IR IMAGING  GUIDED PORT INSERTION  03/22/2022   IR IMAGING GUIDED PORT INSERTION  05/02/2022   IR PORT REPAIR CENTRAL VENOUS ACCESS DEVICE  04/19/2022   IR REMOVAL TUN CV CATH W/O FL   05/02/2022   IR US LIVER BIOPSY  03/22/2022   PORTACATH PLACEMENT  11/2011   PORTACATH PLACEMENT     REMOVAL  MARCH 2014   VENTRAL HERNIA REPAIR N/A 09/03/2013   Procedure: LAPAROSCOPIC VENTRAL WALL HERNIA REPAIR;  Surgeon: Ardeth Sportsman, MD;  Location: [facility];  Service: General;  Laterality: N/A;   WISDOM TOOTH EXTRACTION     in 11th grade    SOCIAL HISTORY: Social History   Socioeconomic History   Marital status: Married    Spouse name: [name]   Number of children: 2   Years of education: college   Highest education level: Not on file  Occupational History    Comment: [employer]  Tobacco Use   Smoking status: Never   Smokeless tobacco: Never  Substance and Sexual Activity   Alcohol use: Yes    Alcohol/week: 1.0 standard drink of alcohol    Types: 1 Glasses of wine per week    Comment: occas   Drug use: No   Sexual activity: Not Currently    Birth control/protection: Pill  Other Topics Concern   Not on file  Social History Narrative   Patient is married [name]). Patient works part time at [employer].   Right handed.   Patient has her CMA.   Caffeine- None         Social Determinants of Health   Financial Resource Strain: Not on file  Food Insecurity: No Food Insecurity (05/10/2022)   Hunger Vital Sign    Worried About Running Out of Food in the Last Year: Never true    Ran Out of Food in the Last Year: Never true  Transportation Needs: No Transportation Needs (05/10/2022)   PRAPARE - Administrator, Civil Service (Medical): No    Lack of Transportation (Non-Medical): No  Physical Activity: Not on file  Stress: Not on file  Social Connections: Not on file  Intimate Partner Violence: Not At Risk (05/10/2022)   Humiliation, Afraid, Rape, and Kick questionnaire    Fear of Current or Ex-Partner: No    Emotionally Abused: No    Physically Abused: No    Sexually Abused: No    FAMILY HISTORY: Family History  Problem Relation Age of Onset   Breast  cancer Mother        dx > 50   Heart attack Mother    Diabetes Father    Breast cancer Maternal Aunt        dx > 50   Breast cancer Maternal Aunt        dx > 50   Breast cancer Maternal Grandmother        ? < 50   Stroke Paternal Grandmother    Alcoholism Other     ALLERGIES:  is allergic to compazine [prochlorperazine edisylate], propofol, oxycodone-acetaminophen, prochlorperazine, and labetalol.  MEDICATIONS:  Current Outpatient Medications  Medication Sig Dispense Refill   amLODipine (NORVASC) 10 MG tablet Take 1 tablet by mouth daily.     apixaban (ELIQUIS) 5 MG TABS tablet Take 1 tablet (5 mg total) by mouth 2 (two) times daily. 60 tablet 2   Cholecalciferol 1.25 MG (50000 UT) capsule Take 50,000 Units by mouth once a week.     citalopram (CELEXA) 10 MG tablet Take  by mouth. (Patient not taking: Reported on 06/15/2022)     Cyanocobalamin (VITAMIN B-12 CR PO) Take by mouth.     diphenoxylate-atropine (LOMOTIL) 2.5-0.025 MG tablet Take 1 tablet by mouth 4 (four) times daily as needed for diarrhea or loose stools. 30 tablet 0   ELDERBERRY PO Take by mouth.     eletriptan (RELPAX) 40 MG tablet Take 1 tablet (40 mg total) by mouth as needed for migraine or headache. May repeat in 2 hours if needed. 15 tablet 6   FIBER ADULT GUMMIES PO Take by mouth.     Folic Acid (FOLATE PO) Place 1 mg under the tongue daily.     hydrochlorothiazide (MICROZIDE) 12.5 MG capsule Take 12.5 mg by mouth daily as needed (swelling). (Patient not taking: Reported on 06/15/2022)     lidocaine-prilocaine (EMLA) cream Apply 1 Application topically as needed. 30 g 0   LORazepam (ATIVAN) 0.5 MG tablet Take 0.5 mg by mouth as needed for anxiety.     magic mouthwash (multi-ingredient) oral suspension Swish and swallow 5 mLs by mouth 4 times daily as needed for throat discomfort 400 mL 1   magnesium oxide (MAG-OX) 400 (241.3 MG) MG tablet TAKE 1 TABLET BY MOUTH TWICE A DAY 60 tablet 6   [START ON 08/07/2022] morphine  (MS CONTIN) 30 MG 12 hr tablet Take 1 tablet (30 mg total) by mouth every 8 (eight) hours. 90 tablet 0   morphine (MSIR) 15 MG tablet Take 1 tablet (15 mg total) by mouth every 6 (six) hours as needed for severe pain or moderate pain. 45 tablet 0   OLANZapine (ZYPREXA) 10 MG tablet TAKE 1 TABLET BY MOUTH EVERYDAY AT BEDTIME 90 tablet 1   ondansetron (ZOFRAN) 8 MG tablet Take 1 tablet (8 mg total) by mouth every 8 (eight) hours as needed for nausea or vomiting. 90 tablet 0   ondansetron (ZOFRAN-ODT) 8 MG disintegrating tablet Take 1 tablet (8 mg total) by mouth every 8 (eight) hours as needed for nausea or vomiting. 20 tablet 0   promethazine (PHENERGAN) 25 MG tablet Take 1 tablet (25 mg total) by mouth every 6 (six) hours as needed for nausea or vomiting. 30 tablet 0   scopolamine (TRANSDERM-SCOP) 1 MG/3DAYS Place 1 patch (1.5 mg total) onto the skin every 3 (three) days. 10 patch 12   No current facility-administered medications for this visit.    REVIEW OF SYSTEMS:   Constitutional: ( - ) fevers, ( - )  chills , ( - ) night sweats Eyes: ( - ) blurriness of vision, ( - ) double vision, ( - ) watery eyes Ears, nose, mouth, throat, and face: ( - ) mucositis, ( - ) sore throat Respiratory: ( - ) cough, ( - ) dyspnea, ( - ) wheezes Cardiovascular: ( - ) palpitation, ( - ) chest discomfort, ( - ) lower extremity swelling Gastrointestinal:  (+) nausea, ( - ) heartburn, ( -) change in bowel habits Skin: ( - ) abnormal skin rashes Lymphatics: ( - ) new lymphadenopathy, ( - ) easy bruising Neurological: ( - ) numbness, ( - ) tingling, ( - ) new weaknesses Behavioral/Psych: ( - ) mood change, ( - ) new changes  All other systems were reviewed with the patient and are negative.  PHYSICAL EXAMINATION: ECOG PERFORMANCE STATUS: 1 - Symptomatic but completely ambulatory  There were no vitals filed for this visit.  There were no vitals filed for this visit.  08/04/22  Height 5\' 2"  (1.575  m)  Weight  132 lb 3.2 oz (60 kg)  BSA (Calculated - sq m) 1.62 sq meters  Temp 98.3 F (36.8 C)  Temp src Oral  Pulse 86  Resp 16  BP 143/88 !  !: Data is abnormal  GENERAL: well appearing female in NAD  SKIN: skin color, texture, turgor are normal, no rashes or significant lesions EYES: conjunctiva are pink and non-injected, sclera clear LUNGS: clear to auscultation and percussion with normal breathing effort HEART: regular rate & rhythm and no murmurs and no lower extremity edema Musculoskeletal: no cyanosis of digits and no clubbing  PSYCH: alert & oriented x 3, fluent speech NEURO: no focal motor/sensory deficits  LABORATORY DATA:  I have reviewed the data as listed    Latest Ref Rng & Units 08/04/2022    9:41 AM 07/21/2022    9:51 AM 07/13/2022    2:58 PM  CBC  WBC 4.0 - 10.5 K/uL 5.3  4.3  3.9   Hemoglobin 12.0 - 15.0 g/dL 16.1  09.6  9.8   Hematocrit 36.0 - 46.0 % 31.6  30.9  30.9   Platelets 150 - 400 K/uL 81  173  183        Latest Ref Rng & Units 08/04/2022    9:41 AM 07/21/2022    9:51 AM 07/13/2022    2:58 PM  CMP  Glucose 70 - 99 mg/dL 96  97  90   BUN 6 - 20 mg/dL 10  8  9    Creatinine 0.44 - 1.00 mg/dL 0.45  4.09  8.11   Sodium 135 - 145 mmol/L 141  139  137   Potassium 3.5 - 5.1 mmol/L 3.4  3.7  3.6   Chloride 98 - 111 mmol/L 105  106  103   CO2 22 - 32 mmol/L 29  29  26    Calcium 8.9 - 10.3 mg/dL 9.6  9.8  9.3   Total Protein 6.5 - 8.1 g/dL 7.4  7.4  7.0   Total Bilirubin 0.3 - 1.2 mg/dL 0.3  0.6  0.6   Alkaline Phos 38 - 126 U/L 329  141  103   AST 15 - 41 U/L 73  31  28   ALT 0 - 44 U/L 106  28  24      RADIOGRAPHIC STUDIES: I have personally reviewed the radiological images as listed and agreed with the findings in the report. No results found.  ASSESSMENT & PLAN Valma Rotenberg is a 57 y.o. female who returns to the clinic for newly diagnosed pancreatic adenocarcinoma.    #Stage IV Pancreatic adenocarcinoma involving the liver: --Liver biopsy  on 03/22/2022 confirmed metastatic adenocarcinoma --Due to metastatic involvement in the liver, patient is not a candidate for curative therapies including surgery --Discussed mainstay treatment will be chemotherapy. Dr. Leonides Schanz recommends chemotherapy regimen with FOLFIRINOX q 2 weeks.  --Plan to obtain restaging CT scans every 3 months to assess treatment response.  --[facility] Testing: MS-stable, 2 Muts/Mb, ATMH661fs*31, KRASG12L, MDM2 amplification, CDKN2A/B. No targetable mutations.  --Started mFOLFIRINOX on 03/30/2022 --After Cycle 3 on 05/04/2022, patient was on a treatment break due to poor tolerance and patient's request. --Most recent CT CAP from 06/30/2022 showed treatment response. --Patient resumed FOLFIRINOX without dose modification on 07/21/2022  PLAN: --Due for Cycle 5, Day 1 of FOLFIRINOX today --Labs from today reviewed with patient.  White blood cell 5.3, hemoglobin 10.1, MCV 91.1, and platelets of 81K --Recommend to HOLD treatment today due to thrombocytopenia  and elevated LFTs. Discussed need for dose reduction to prevent further cytopenias and rise in LFTs. Patient agreed for dose reduction. We will dose reduce irinotecan by 25% and consider further reduction in the future.  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  --Currently on Eliquis 5 mg BID  #Neutropenia--resolved --Added Udenyca on Day 3 with Cycle 2.  --Neutropenic precautions given to patient including monitoring for fevers.   #Elevated LFTs: --ALT 73, AST 106, Alk phos 329.  --Most likely secondary to chemotherapy --Monitor for now  #Nausea/Vomiting: --Secondary to chemotherapy -- Continue to take  scopolamine patch, phenergan 25 mg q 6 hours, zofran 8mg  q8H PRN for nausea, zyprexa nightly PRN for nausea.   #Epigastric/RUQ/back pain: --Likely secondary to underlying malignancy --Tramadol was ineffective.  --Current pain regimen includes MS contin to 30 mg q 12 hours and Norco 10-325 mg.   #Appetite loss/weight loss: --Lost 5 lbs since 07/21/2022 --Encouraged to eat small, frequent meals and supplement with protein shakes --will request a follow up with [facility] nutrition team  #Family history of breast cancer: --Due to strong family history of cancer and has undergone genetic testing in the past.  --Underwent genetic testing on 04/07/2022 that was negative. 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   #Supportive Care -- chemotherapy education to be scheduled  -- port placement complete  -- EMLA cream for port  Orders Placed This Encounter  Procedures   CBC with Differential (Cancer Center Only)    Standing Status:   Future    Standing Expiration Date:   08/15/2023   CMP (Cancer Center only)    Standing Status:   Future    Standing Expiration Date:   08/15/2023   All questions were answered. The patient knows to call the clinic with any problems, questions or concerns.  I have spent a total of 25 minutes minutes of face-to-face and non-face-to-face time, preparing to see the patient,  performing a medically appropriate examination, counseling and educating the patient, documenting clinical information in the electronic health record,  and care coordination.   Georga Kaufmann PA-C Dept of Hematology and Oncology [facility] Phone: [phone]

## 2022-08-04 NOTE — Addendum Note (Signed)
Addended by: Briant Cedar on: 08/04/2022 04:13 PM   Modules accepted: Orders

## 2022-08-06 ENCOUNTER — Inpatient Hospital Stay: Payer: BC Managed Care – PPO

## 2022-08-16 MED FILL — Dexamethasone Sodium Phosphate Inj 100 MG/10ML: INTRAMUSCULAR | Qty: 1 | Status: AC

## 2022-08-16 MED FILL — Fosaprepitant Dimeglumine For IV Infusion 150 MG (Base Eq): INTRAVENOUS | Qty: 5 | Status: AC

## 2022-08-16 NOTE — Progress Notes (Signed)
[facility] [phone]   [phone]  PROGRESS NOTE:  Patient Care Team: Angelica Chessman, MD as PCP - General (Family Medicine) Reece Packer, MD as Consulting Physician (Medical Oncology) Pickenpack-Cousar, Arty Baumgartner, NP as Nurse Practitioner (Nurse Practitioner)  CHIEF COMPLAINTS/PURPOSE OF CONSULTATION:  Metastatic pancreatic adenocarcinoma involving the liver  ONCOLOGIC HISTORY: Presented with nausea and right upper quadrant pain 03/08/2022: Abdominal US showed multiple hypoechoic masses within the liver are indeterminate.  03/10/2022: MR abdomen: Hypoenhancing mass compatible with pancreatic head adenocarcinoma with numerous targetoid enhancing masses in all segments of the liver, compatible with metastatic disease. The mass severely effaces and nearly occludes the portal vein and splenic vein, and occludes the superior mesenteric vein with some collaterals noted. The mass substantially abuts the proximal transverse duodenum, although overt duodenal invasion is indeterminate. 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 Prominent stool throughout the colon favors constipation. Fluid-fluid level in the gallbladder likely from sludge. 03/14/2022: Establish care with [facility] Hematology/Oncology 03/17/2022: CT chest: no definitive evidence of lung metastases.  03/22/2022: Liver biopsy confirmed metastatic adenocarcinoma, pancreatic primary.  03/30/2022: Cycle 1, Day 1 of mFOLFIRINOX 04/14/2022: Cycle 2, Day 1 of mFOLFIRINOX HELD due to ANC 300.  04/20/2022: Cycle 2, Day 1 of mFOLFIRINOX 05/04/2022: Cycle 3, Day 1 of  mFOLFIRINOX 05/19/2022: Cycle 4, Day 1 of mFOLFIRINOX HELD per patient request.  07/21/2022: Cycle 4, Day 1 of mFOLFIRINOX 08/04/2022: Cycle 5, Day 1 of mFOLFIRINOX HELD due to Plt 81K 08/17/2022: Cycle 5, Day 1 of mFOLFIRINOX   HISTORY OF PRESENTING ILLNESS:  Alaisa Moffitt presents today for a follow up. She was last seen by Dr. Leonides Schanz on 08/04/2022. In the interim, she has resumed chemotherapy and presents today for Cycle 5, Day 1.   Ms. Plazola reports after her last cycle she did have 2 days of nausea and vomiting.  She reports that her appetite however rebounded and she began eating more frequently.  She is having some fatigue and abdominal pain/discomfort.  She reports MS Contin does "make it more tolerable".  She reports that she did have symptoms of UTI while on vacation and was taken at nitrofurantoin.  Otherwise she is willing and able to proceed with continued chemotherapy at this time.  She denies fevers, chills, sweats, shortness of breath, chest pain or cough. She has no other complaints. Rest of the 10 point ROS is below.   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 Gross, MD;  Location: [facility];  Service: General;  Laterality: N/A;   IR CHEST FLUORO  04/14/2022   IR IMAGING GUIDED PORT INSERTION  03/22/2022   IR IMAGING GUIDED PORT INSERTION   05/02/2022   IR PORT REPAIR CENTRAL VENOUS ACCESS DEVICE  04/19/2022   IR REMOVAL TUN CV CATH W/O FL  05/02/2022   IR US LIVER BIOPSY  03/22/2022   PORTACATH PLACEMENT  11/2011   PORTACATH PLACEMENT     REMOVAL  MARCH 2014   VENTRAL HERNIA REPAIR N/A 09/03/2013   Procedure: LAPAROSCOPIC VENTRAL WALL HERNIA REPAIR;  Surgeon: Ardeth Sportsman, MD;  Location: [facility];  Service: General;  Laterality: N/A;   WISDOM TOOTH EXTRACTION     in 11th grade    SOCIAL HISTORY: Social History   Socioeconomic History   Marital status: Married    Spouse name: [name]   Number of children: 2   Years of education: college   Highest education level: Not on file  Occupational History    Comment: [employer]  Tobacco Use   Smoking status: Never   Smokeless tobacco: Never  Substance and Sexual Activity   Alcohol use: Yes    Alcohol/week: 1.0 standard drink of alcohol    Types: 1 Glasses of wine per week    Comment: occas   Drug use: No   Sexual activity: Not Currently    Birth control/protection: Pill  Other Topics Concern   Not on file  Social History Narrative   Patient is married [name]). Patient works part time at [employer].   Right handed.   Patient has her CMA.   Caffeine- None         Social Determinants of Health   Financial Resource Strain: Not on file  Food Insecurity: No Food Insecurity (05/10/2022)   Hunger Vital Sign    Worried About Running Out of Food in the Last Year: Never true    Ran Out of Food in the Last Year: Never true  Transportation Needs: No Transportation Needs (05/10/2022)   PRAPARE - Administrator, Civil Service (Medical): No    Lack of Transportation (Non-Medical): No  Physical Activity: Not on file  Stress: Not on file  Social Connections: Not on file  Intimate Partner Violence: Not At Risk (05/10/2022)   Humiliation, Afraid, Rape, and Kick questionnaire    Fear of Current or Ex-Partner: No    Emotionally Abused: No    Physically Abused: No     Sexually Abused: No    FAMILY HISTORY: Family History  Problem Relation Age of Onset   Breast cancer Mother        dx > 50   Heart attack Mother    Diabetes Father    Breast cancer Maternal Aunt        dx > 50   Breast cancer Maternal Aunt        dx > 50   Breast cancer Maternal Grandmother        ? < 50   Stroke Paternal Grandmother    Alcoholism Other     ALLERGIES:  is allergic to compazine [prochlorperazine edisylate], propofol, oxycodone-acetaminophen, prochlorperazine, and labetalol.  MEDICATIONS:  Current Outpatient Medications  Medication Sig Dispense Refill   amLODipine (NORVASC) 10 MG tablet Take 1 tablet by mouth daily.     apixaban (ELIQUIS) 5 MG TABS tablet Take 1 tablet (5 mg total) by mouth 2 (two) times daily. 60 tablet 2  Cholecalciferol 1.25 MG (50000 UT) capsule Take 50,000 Units by mouth once a week.     citalopram (CELEXA) 10 MG tablet Take by mouth. (Patient not taking: Reported on 06/15/2022)     Cyanocobalamin (VITAMIN B-12 CR PO) Take by mouth.     diphenoxylate-atropine (LOMOTIL) 2.5-0.025 MG tablet Take 1 tablet by mouth 4 (four) times daily as needed for diarrhea or loose stools. 30 tablet 0   ELDERBERRY PO Take by mouth.     eletriptan (RELPAX) 40 MG tablet Take 1 tablet (40 mg total) by mouth as needed for migraine or headache. May repeat in 2 hours if needed. 15 tablet 6   FIBER ADULT GUMMIES PO Take by mouth.     Folic Acid (FOLATE PO) Place 1 mg under the tongue daily.     hydrochlorothiazide (MICROZIDE) 12.5 MG capsule Take 12.5 mg by mouth daily as needed (swelling). (Patient not taking: Reported on 06/15/2022)     lidocaine-prilocaine (EMLA) cream Apply 1 Application topically as needed. 30 g 0   LORazepam (ATIVAN) 0.5 MG tablet Take 0.5 mg by mouth as needed for anxiety.     magic mouthwash (multi-ingredient) oral suspension Swish and swallow 5 mLs by mouth 4 times daily as needed for throat discomfort 400 mL 1   magnesium oxide (MAG-OX) 400  (241.3 MG) MG tablet TAKE 1 TABLET BY MOUTH TWICE A DAY 60 tablet 6   morphine (MS CONTIN) 30 MG 12 hr tablet Take 1 tablet (30 mg total) by mouth every 8 (eight) hours. 90 tablet 0   morphine (MSIR) 15 MG tablet Take 1 tablet (15 mg total) by mouth every 6 (six) hours as needed for severe pain or moderate pain. 45 tablet 0   OLANZapine (ZYPREXA) 10 MG tablet TAKE 1 TABLET BY MOUTH EVERYDAY AT BEDTIME 90 tablet 1   ondansetron (ZOFRAN) 8 MG tablet Take 1 tablet (8 mg total) by mouth every 8 (eight) hours as needed for nausea or vomiting. 90 tablet 0   ondansetron (ZOFRAN-ODT) 8 MG disintegrating tablet Take 1 tablet (8 mg total) by mouth every 8 (eight) hours as needed for nausea or vomiting. 20 tablet 0   promethazine (PHENERGAN) 25 MG tablet Take 1 tablet (25 mg total) by mouth every 6 (six) hours as needed for nausea or vomiting. 30 tablet 0   scopolamine (TRANSDERM-SCOP) 1 MG/3DAYS Place 1 patch (1.5 mg total) onto the skin every 3 (three) days. 10 patch 12   No current facility-administered medications for this visit.    REVIEW OF SYSTEMS:   Constitutional: ( - ) fevers, ( - )  chills , ( - ) night sweats Eyes: ( - ) blurriness of vision, ( - ) double vision, ( - ) watery eyes Ears, nose, mouth, throat, and face: ( - ) mucositis, ( - ) sore throat Respiratory: ( - ) cough, ( - ) dyspnea, ( - ) wheezes Cardiovascular: ( - ) palpitation, ( - ) chest discomfort, ( - ) lower extremity swelling Gastrointestinal:  (+) nausea, ( - ) heartburn, ( -) change in bowel habits Skin: ( - ) abnormal skin rashes Lymphatics: ( - ) new lymphadenopathy, ( - ) easy bruising Neurological: ( - ) numbness, ( - ) tingling, ( - ) new weaknesses Behavioral/Psych: ( - ) mood change, ( - ) new changes  All other systems were reviewed with the patient and are negative.  PHYSICAL EXAMINATION: ECOG PERFORMANCE STATUS: 1 - Symptomatic but completely ambulatory  Vitals:  08/17/22 0856  BP: 126/81  Pulse: 68   Resp: 13  Temp: 98.2 F (36.8 C)  SpO2: 100%    Filed Weights   08/17/22 0856  Weight: 129 lb 4.8 oz (58.7 kg)    08/04/22  Height 5\' 2"  (1.575 m)  Weight 132 lb 3.2 oz (60 kg)  BSA (Calculated - sq m) 1.62 sq meters  Temp 98.3 F (36.8 C)  Temp src Oral  Pulse 86  Resp 16  BP 143/88 !  !: Data is abnormal  GENERAL: well appearing female in NAD  SKIN: skin color, texture, turgor are normal, no rashes or significant lesions EYES: conjunctiva are pink and non-injected, sclera clear LUNGS: clear to auscultation and percussion with normal breathing effort HEART: regular rate & rhythm and no murmurs and no lower extremity edema Musculoskeletal: no cyanosis of digits and no clubbing  PSYCH: alert & oriented x 3, fluent speech NEURO: no focal motor/sensory deficits  LABORATORY DATA:  I have reviewed the data as listed    Latest Ref Rng & Units 08/17/2022    8:32 AM 08/04/2022    9:41 AM 07/21/2022    9:51 AM  CBC  WBC 4.0 - 10.5 K/uL 5.0  5.3  4.3   Hemoglobin 12.0 - 15.0 g/dL 16.1  09.6  04.5   Hematocrit 36.0 - 46.0 % 32.1  31.6  30.9   Platelets 150 - 400 K/uL 285  81  173        Latest Ref Rng & Units 08/17/2022    8:32 AM 08/04/2022    9:41 AM 07/21/2022    9:51 AM  CMP  Glucose 70 - 99 mg/dL 94  96  97   BUN 6 - 20 mg/dL 14  10  8    Creatinine 0.44 - 1.00 mg/dL 4.09  8.11  9.14   Sodium 135 - 145 mmol/L 141  141  139   Potassium 3.5 - 5.1 mmol/L 3.6  3.4  3.7   Chloride 98 - 111 mmol/L 106  105  106   CO2 22 - 32 mmol/L 29  29  29    Calcium 8.9 - 10.3 mg/dL 9.6  9.6  9.8   Total Protein 6.5 - 8.1 g/dL 7.7  7.4  7.4   Total Bilirubin 0.3 - 1.2 mg/dL 0.4  0.3  0.6   Alkaline Phos 38 - 126 U/L 647  329  141   AST 15 - 41 U/L 163  73  31   ALT 0 - 44 U/L 110  106  28      RADIOGRAPHIC STUDIES: I have personally reviewed the radiological images as listed and agreed with the findings in the report. No results found.  ASSESSMENT & PLAN Jaretzi Droz is a 57 y.o. female who returns to the clinic for newly diagnosed pancreatic adenocarcinoma.    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 Dr. Leonides Schanz recommends chemotherapy regimen with FOLFIRINOX q 2 weeks.  --Plan to obtain restaging CT scans every 3 months to assess treatment response.  --[facility] Testing: MS-stable, 2 Muts/Mb, ATMH618fs*31, KRASG12L, MDM2 amplification, CDKN2A/B. No targetable mutations.  --Started mFOLFIRINOX on 03/30/2022 --After Cycle 3 on 05/04/2022, patient was on a treatment break due to poor tolerance and patient's request. --Most recent CT CAP from 06/30/2022 showed treatment response. --  Patient resumed FOLFIRINOX without dose modification on 07/21/2022  PLAN: --Due for Cycle 5, Day 1 of FOLFIRINOX today --Labs from today reviewed with patient.  White blood cell 5.0, hemoglobin 10.3, MCV 92, and platelets of 285 -- Discussed need for dose reduction to prevent further cytopenias and rise in LFTs. Patient agreed for dose reduction. We will dose reduce irinotecan by 25% and consider further reduction in the future.  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  --Currently on Eliquis 5 mg BID  #Neutropenia--resolved --Added Udenyca on Day 3 with Cycle 2.  --Neutropenic precautions given to patient including monitoring for fevers.   #Elevated LFTs: --ALT 163, AST 110, Alk phon 647.  --Most likely secondary to  chemotherapy --Monitor for now  #Nausea/Vomiting: --Secondary to chemotherapy -- Continue to take scopolamine patch, phenergan 25 mg q 6 hours, zofran 8mg  q8H PRN for nausea, zyprexa nightly PRN for nausea.   #Epigastric/RUQ/back pain: --Likely secondary to underlying malignancy --Tramadol was ineffective.  --Current pain regimen includes MS contin to 30 mg q 12 hours and Norco 10-325 mg.   #Appetite loss/weight loss: --Lost 8 lbs since 07/21/2022 --Encouraged to eat small, frequent meals and supplement with protein shakes --will request a follow up with [facility] nutrition team  #Family history of breast cancer: --Due to strong family history of cancer and has undergone genetic testing in the past.  --Underwent genetic testing on 04/07/2022 that was negative. 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   #Supportive Care -- chemotherapy education to be scheduled  -- port placement complete  -- EMLA cream for port  No orders of the defined types were placed in this encounter.  All questions were answered. The patient knows to call the clinic with any problems, questions or concerns.  I have spent a total of 30 minutes minutes of face-to-face and non-face-to-face time, preparing to see the patient,  performing a medically appropriate examination, counseling and educating the patient, documenting clinical information in the electronic health record,  and care coordination.   Ulysees Barns, MD Department of Hematology/Oncology [facility] Phone: [phone] Pager: [phone] Email: [email or web address]

## 2022-08-17 ENCOUNTER — Inpatient Hospital Stay: Payer: BC Managed Care – PPO | Attending: Physician Assistant

## 2022-08-17 ENCOUNTER — Inpatient Hospital Stay (HOSPITAL_BASED_OUTPATIENT_CLINIC_OR_DEPARTMENT_OTHER): Payer: BC Managed Care – PPO | Admitting: Hematology and Oncology

## 2022-08-17 ENCOUNTER — Inpatient Hospital Stay: Payer: BC Managed Care – PPO

## 2022-08-17 ENCOUNTER — Inpatient Hospital Stay: Payer: BC Managed Care – PPO | Admitting: Dietician

## 2022-08-17 VITALS — BP 126/80 | HR 70 | Resp 16 | Ht 62.0 in | Wt 131.0 lb

## 2022-08-17 DIAGNOSIS — C259 Malignant neoplasm of pancreas, unspecified: Secondary | ICD-10-CM | POA: Diagnosis not present

## 2022-08-17 DIAGNOSIS — I10 Essential (primary) hypertension: Secondary | ICD-10-CM | POA: Diagnosis not present

## 2022-08-17 DIAGNOSIS — C250 Malignant neoplasm of head of pancreas: Secondary | ICD-10-CM | POA: Insufficient documentation

## 2022-08-17 DIAGNOSIS — C787 Secondary malignant neoplasm of liver and intrahepatic bile duct: Secondary | ICD-10-CM | POA: Diagnosis not present

## 2022-08-17 DIAGNOSIS — G893 Neoplasm related pain (acute) (chronic): Secondary | ICD-10-CM | POA: Diagnosis not present

## 2022-08-17 DIAGNOSIS — Z515 Encounter for palliative care: Secondary | ICD-10-CM | POA: Insufficient documentation

## 2022-08-17 DIAGNOSIS — D709 Neutropenia, unspecified: Secondary | ICD-10-CM | POA: Diagnosis not present

## 2022-08-17 DIAGNOSIS — Z95828 Presence of other vascular implants and grafts: Secondary | ICD-10-CM

## 2022-08-17 DIAGNOSIS — I81 Portal vein thrombosis: Secondary | ICD-10-CM | POA: Diagnosis not present

## 2022-08-17 DIAGNOSIS — Z5111 Encounter for antineoplastic chemotherapy: Secondary | ICD-10-CM | POA: Insufficient documentation

## 2022-08-17 DIAGNOSIS — R7989 Other specified abnormal findings of blood chemistry: Secondary | ICD-10-CM | POA: Diagnosis not present

## 2022-08-17 DIAGNOSIS — R112 Nausea with vomiting, unspecified: Secondary | ICD-10-CM | POA: Diagnosis not present

## 2022-08-17 DIAGNOSIS — Z452 Encounter for adjustment and management of vascular access device: Secondary | ICD-10-CM | POA: Diagnosis not present

## 2022-08-17 DIAGNOSIS — Z86718 Personal history of other venous thrombosis and embolism: Secondary | ICD-10-CM | POA: Diagnosis not present

## 2022-08-17 DIAGNOSIS — Z5189 Encounter for other specified aftercare: Secondary | ICD-10-CM | POA: Insufficient documentation

## 2022-08-17 DIAGNOSIS — Z7901 Long term (current) use of anticoagulants: Secondary | ICD-10-CM | POA: Insufficient documentation

## 2022-08-17 LAB — CBC WITH DIFFERENTIAL (CANCER CENTER ONLY)
Abs Immature Granulocytes: 0.02 10*3/uL (ref 0.00–0.07)
Basophils Absolute: 0 10*3/uL (ref 0.0–0.1)
Basophils Relative: 0 %
Eosinophils Absolute: 0 10*3/uL (ref 0.0–0.5)
Eosinophils Relative: 1 %
HCT: 32.1 % — ABNORMAL LOW (ref 36.0–46.0)
Hemoglobin: 10.3 g/dL — ABNORMAL LOW (ref 12.0–15.0)
Immature Granulocytes: 0 %
Lymphocytes Relative: 37 %
Lymphs Abs: 1.9 10*3/uL (ref 0.7–4.0)
MCH: 29.5 pg (ref 26.0–34.0)
MCHC: 32.1 g/dL (ref 30.0–36.0)
MCV: 92 fL (ref 80.0–100.0)
Monocytes Absolute: 1 10*3/uL (ref 0.1–1.0)
Monocytes Relative: 19 %
Neutro Abs: 2.1 10*3/uL (ref 1.7–7.7)
Neutrophils Relative %: 43 %
Platelet Count: 285 10*3/uL (ref 150–400)
RBC: 3.49 MIL/uL — ABNORMAL LOW (ref 3.87–5.11)
RDW: 14.5 % (ref 11.5–15.5)
WBC Count: 5 10*3/uL (ref 4.0–10.5)
nRBC: 0 % (ref 0.0–0.2)

## 2022-08-17 LAB — CMP (CANCER CENTER ONLY)
ALT: 110 U/L — ABNORMAL HIGH (ref 0–44)
AST: 163 U/L — ABNORMAL HIGH (ref 15–41)
Albumin: 4 g/dL (ref 3.5–5.0)
Alkaline Phosphatase: 647 U/L — ABNORMAL HIGH (ref 38–126)
Anion gap: 6 (ref 5–15)
BUN: 14 mg/dL (ref 6–20)
CO2: 29 mmol/L (ref 22–32)
Calcium: 9.6 mg/dL (ref 8.9–10.3)
Chloride: 106 mmol/L (ref 98–111)
Creatinine: 0.68 mg/dL (ref 0.44–1.00)
GFR, Estimated: >60 mL/min (ref >60)
Glucose, Bld: 94 mg/dL (ref 70–99)
Potassium: 3.6 mmol/L (ref 3.5–5.1)
Sodium: 141 mmol/L (ref 135–145)
Total Bilirubin: 0.4 mg/dL (ref 0.3–1.2)
Total Protein: 7.7 g/dL (ref 6.5–8.1)

## 2022-08-17 MED ORDER — OXALIPLATIN CHEMO INJECTION 100 MG/20ML
85.0000 mg/m2 | Freq: Once | INTRAVENOUS | Status: AC
  Administered 2022-08-17: 150 mg via INTRAVENOUS
  Filled 2022-08-17: qty 20

## 2022-08-17 MED ORDER — SODIUM CHLORIDE 0.9 % IV SOLN
150.0000 mg | Freq: Once | INTRAVENOUS | Status: AC
  Administered 2022-08-17: 150 mg via INTRAVENOUS
  Filled 2022-08-17: qty 150
  Filled 2022-08-17: qty 5

## 2022-08-17 MED ORDER — DEXTROSE 5 % IV SOLN: Freq: Once | INTRAVENOUS | Status: AC

## 2022-08-17 MED ORDER — SODIUM CHLORIDE 0.9% FLUSH
10.0000 mL | Freq: Once | INTRAVENOUS | Status: AC
  Administered 2022-08-17: 10 mL

## 2022-08-17 MED ORDER — SODIUM CHLORIDE 0.9 % IV SOLN
112.5000 mg/m2 | Freq: Once | INTRAVENOUS | Status: AC
  Administered 2022-08-17: 200 mg via INTRAVENOUS
  Filled 2022-08-17: qty 10

## 2022-08-17 MED ORDER — SODIUM CHLORIDE 0.9 % IV SOLN
10.0000 mg | Freq: Once | INTRAVENOUS | Status: AC
  Administered 2022-08-17: 10 mg via INTRAVENOUS
  Filled 2022-08-17: qty 1
  Filled 2022-08-17: qty 10

## 2022-08-17 MED ORDER — ATROPINE SULFATE 1 MG/ML IV SOLN
0.5000 mg | Freq: Once | INTRAVENOUS | Status: AC | PRN
  Administered 2022-08-17: 0.5 mg via INTRAVENOUS
  Filled 2022-08-17: qty 1

## 2022-08-17 MED ORDER — SODIUM CHLORIDE 0.9 % IV SOLN
400.0000 mg/m2 | Freq: Once | INTRAVENOUS | Status: AC
  Administered 2022-08-17: 712 mg via INTRAVENOUS
  Filled 2022-08-17: qty 25

## 2022-08-17 MED ORDER — SODIUM CHLORIDE 0.9 % IV SOLN
2400.0000 mg/m2 | INTRAVENOUS | Status: DC
  Administered 2022-08-17: 4250 mg via INTRAVENOUS
  Filled 2022-08-17: qty 85

## 2022-08-17 MED ORDER — PALONOSETRON HCL INJECTION 0.25 MG/5ML
0.2500 mg | Freq: Once | INTRAVENOUS | Status: AC
  Administered 2022-08-17: 0.25 mg via INTRAVENOUS
  Filled 2022-08-17: qty 5

## 2022-08-17 NOTE — Progress Notes (Signed)
OK to treat with elevated LFTs per Dr. Dorsey 

## 2022-08-17 NOTE — Progress Notes (Signed)
Decreased Irinotecan 25% per MD. Future cycles decreased as well.  Anola Gurney [place], [place], BCPS, BCPS 08/17/2022 9:33 AM

## 2022-08-17 NOTE — Addendum Note (Signed)
Addended by: Neita Goodnight on: 08/17/2022 03:50 PM   Modules accepted: Orders

## 2022-08-17 NOTE — Patient Instructions (Signed)
[facility]  Discharge Instructions: Thank you for choosing [facility] to provide your oncology and hematology care.   If you have a lab appointment with the Cancer Center, please go directly to the Cancer Center and check in at the registration area.   Wear comfortable clothing and clothing appropriate for easy access to any Portacath or PICC line.   We strive to give you quality time with your provider. You may need to reschedule your appointment if you arrive late (15 or more minutes).  Arriving late affects you and other patients whose appointments are after yours.  Also, if you miss three or more appointments without notifying the office, you may be dismissed from the clinic at the provider's discretion.      For prescription refill requests, have your pharmacy contact our office and allow 72 hours for refills to be completed.    Today you received the following chemotherapy and/or immunotherapy agents Oxaliplatin, Irinotecan, Leukovorin, Flurouracil       To help prevent nausea and vomiting after your treatment, we encourage you to take your nausea medication as directed.  BELOW ARE SYMPTOMS THAT SHOULD BE REPORTED IMMEDIATELY: *FEVER GREATER THAN 100.4 F (38 C) OR HIGHER *CHILLS OR SWEATING *NAUSEA AND VOMITING THAT IS NOT CONTROLLED WITH YOUR NAUSEA MEDICATION *UNUSUAL SHORTNESS OF BREATH *UNUSUAL BRUISING OR BLEEDING *URINARY PROBLEMS (pain or burning when urinating, or frequent urination) *BOWEL PROBLEMS (unusual diarrhea, constipation, pain near the anus) TENDERNESS IN MOUTH AND THROAT WITH OR WITHOUT PRESENCE OF ULCERS (sore throat, sores in mouth, or a toothache) UNUSUAL RASH, SWELLING OR PAIN  UNUSUAL VAGINAL DISCHARGE OR ITCHING   Items with * indicate a potential emergency and should be followed up as soon as possible or go to the Emergency Department if any problems should occur.  Please show the CHEMOTHERAPY ALERT  CARD or IMMUNOTHERAPY ALERT CARD at check-in to the Emergency Department and triage nurse.  Should you have questions after your visit or need to cancel or reschedule your appointment, please contact [facility]  Dept: [phone]  and follow the prompts.  Office hours are 8:00 a.m. to 4:30 p.m. Monday - Friday. Please note that voicemails left after 4:00 p.m. may not be returned until the following business day.  We are closed weekends and major holidays. You have access to a nurse at all times for urgent questions. Please call the main number to the clinic Dept: [phone] and follow the prompts.   For any non-urgent questions, you may also contact your provider using MyChart. We now offer e-Visits for anyone 18 and older to request care online for non-urgent symptoms. For details visit [email or web address].   Also download the MyChart app! Go to the app store, search "MyChart", open the app, select [facility], and log in with your MyChart username and password.   

## 2022-08-17 NOTE — Progress Notes (Signed)
Nutrition Follow-up:  Patient with pancreatic carcinoma metastatic to liver. She is receiving modified Folfirinox q14d (start 2/20)   Met with patient in infusion. She reports feeling well overall with decreased nausea. Appetite has improved. Says she has been eating good. Patient hungry at visit, states husband is out getting her breakfast. Patient went to the beach last week. She recalls delicious dinner at [employer] (meatloaf, mac/cheese, beans). Patient is drinking 1-2 CIB and likes these.    Medications: reviewed  Labs: reviewed   Anthropometrics: Wt 131 lb today   6/27 - 132 lb 3.2 oz  5/20 - 138 lb 11.2 oz   NUTRITION DIAGNOSIS: Unintended wt loss continues    INTERVENTION:  Continue strategies for increasing calories and protein with small frequent meals/snacks Continue drinking 1-2 CIB Suggested trying CIB mixed with ice cream for high calorie/protein shake - shake recipes provided Pt agreeable to try at least one shake recipe prior to next appointment    MONITORING, EVALUATION, GOAL: weight trends, intake   NEXT VISIT: Thursday July 25 during infusion

## 2022-08-18 ENCOUNTER — Telehealth: Payer: Self-pay | Admitting: Hematology and Oncology

## 2022-08-18 LAB — CANCER ANTIGEN 19-9: CA 19-9: 4569 U/mL — ABNORMAL HIGH (ref 0–35)

## 2022-08-19 ENCOUNTER — Inpatient Hospital Stay: Payer: BC Managed Care – PPO

## 2022-08-19 VITALS — BP 154/87 | HR 84 | Temp 98.9°F | Resp 16

## 2022-08-19 DIAGNOSIS — Z7901 Long term (current) use of anticoagulants: Secondary | ICD-10-CM | POA: Diagnosis not present

## 2022-08-19 DIAGNOSIS — Z86718 Personal history of other venous thrombosis and embolism: Secondary | ICD-10-CM | POA: Diagnosis not present

## 2022-08-19 DIAGNOSIS — Z452 Encounter for adjustment and management of vascular access device: Secondary | ICD-10-CM | POA: Diagnosis not present

## 2022-08-19 DIAGNOSIS — Z5189 Encounter for other specified aftercare: Secondary | ICD-10-CM | POA: Diagnosis not present

## 2022-08-19 DIAGNOSIS — C787 Secondary malignant neoplasm of liver and intrahepatic bile duct: Secondary | ICD-10-CM | POA: Diagnosis not present

## 2022-08-19 DIAGNOSIS — C259 Malignant neoplasm of pancreas, unspecified: Secondary | ICD-10-CM

## 2022-08-19 DIAGNOSIS — Z515 Encounter for palliative care: Secondary | ICD-10-CM | POA: Diagnosis not present

## 2022-08-19 DIAGNOSIS — R112 Nausea with vomiting, unspecified: Secondary | ICD-10-CM | POA: Diagnosis not present

## 2022-08-19 DIAGNOSIS — G893 Neoplasm related pain (acute) (chronic): Secondary | ICD-10-CM | POA: Diagnosis not present

## 2022-08-19 DIAGNOSIS — I10 Essential (primary) hypertension: Secondary | ICD-10-CM | POA: Diagnosis not present

## 2022-08-19 DIAGNOSIS — D709 Neutropenia, unspecified: Secondary | ICD-10-CM | POA: Diagnosis not present

## 2022-08-19 DIAGNOSIS — Z5111 Encounter for antineoplastic chemotherapy: Secondary | ICD-10-CM | POA: Diagnosis not present

## 2022-08-19 DIAGNOSIS — C250 Malignant neoplasm of head of pancreas: Secondary | ICD-10-CM | POA: Diagnosis not present

## 2022-08-19 DIAGNOSIS — I81 Portal vein thrombosis: Secondary | ICD-10-CM | POA: Diagnosis not present

## 2022-08-19 DIAGNOSIS — R7989 Other specified abnormal findings of blood chemistry: Secondary | ICD-10-CM | POA: Diagnosis not present

## 2022-08-19 MED ORDER — PEGFILGRASTIM-CBQV 6 MG/0.6ML ~~LOC~~ SOSY
6.0000 mg | PREFILLED_SYRINGE | Freq: Once | SUBCUTANEOUS | Status: AC
  Administered 2022-08-19: 6 mg via SUBCUTANEOUS
  Filled 2022-08-19: qty 0.6

## 2022-08-19 MED ORDER — HEPARIN SOD (PORK) LOCK FLUSH 100 UNIT/ML IV SOLN
500.0000 [IU] | Freq: Once | INTRAVENOUS | Status: AC | PRN
  Administered 2022-08-19: 500 [IU]

## 2022-08-19 MED ORDER — SODIUM CHLORIDE 0.9% FLUSH
10.0000 mL | INTRAVENOUS | Status: DC | PRN
  Administered 2022-08-19: 10 mL

## 2022-08-21 ENCOUNTER — Encounter: Payer: Self-pay | Admitting: Hematology and Oncology

## 2022-08-22 ENCOUNTER — Other Ambulatory Visit: Payer: Self-pay | Admitting: *Deleted

## 2022-08-22 ENCOUNTER — Inpatient Hospital Stay: Payer: BC Managed Care – PPO | Admitting: Licensed Clinical Social Worker

## 2022-08-22 DIAGNOSIS — C259 Malignant neoplasm of pancreas, unspecified: Secondary | ICD-10-CM

## 2022-08-22 NOTE — Progress Notes (Signed)
[facility] CSW Progress Note  Clinical Child psychotherapist contacted patient by phone to discuss home care options.  Pt inquiring if her husband could be her paid caretaker.  CSW explained to pt that this program is available for Medicaid recipients only.  Alternative home care options discussed.  At this time pt does not wish for anyone she does not know to be in her home.  Pt will reach out should she wish to have a home care referral placed.  CSW to remain available as appropriate to provide support throughout duration of treatment.      Rachel Moulds, LCSW Clinical Social Worker [facility]

## 2022-08-23 DIAGNOSIS — Z01411 Encounter for gynecological examination (general) (routine) with abnormal findings: Secondary | ICD-10-CM | POA: Diagnosis not present

## 2022-08-23 DIAGNOSIS — N952 Postmenopausal atrophic vaginitis: Secondary | ICD-10-CM | POA: Diagnosis not present

## 2022-08-24 ENCOUNTER — Other Ambulatory Visit: Payer: Self-pay | Admitting: Hematology and Oncology

## 2022-08-24 ENCOUNTER — Inpatient Hospital Stay: Payer: BC Managed Care – PPO

## 2022-08-24 ENCOUNTER — Other Ambulatory Visit: Payer: Self-pay

## 2022-08-24 DIAGNOSIS — R112 Nausea with vomiting, unspecified: Secondary | ICD-10-CM | POA: Diagnosis not present

## 2022-08-24 DIAGNOSIS — Z95828 Presence of other vascular implants and grafts: Secondary | ICD-10-CM

## 2022-08-24 DIAGNOSIS — Z7901 Long term (current) use of anticoagulants: Secondary | ICD-10-CM | POA: Diagnosis not present

## 2022-08-24 DIAGNOSIS — Z86718 Personal history of other venous thrombosis and embolism: Secondary | ICD-10-CM | POA: Diagnosis not present

## 2022-08-24 DIAGNOSIS — C259 Malignant neoplasm of pancreas, unspecified: Secondary | ICD-10-CM

## 2022-08-24 DIAGNOSIS — R7989 Other specified abnormal findings of blood chemistry: Secondary | ICD-10-CM | POA: Diagnosis not present

## 2022-08-24 DIAGNOSIS — Z515 Encounter for palliative care: Secondary | ICD-10-CM | POA: Diagnosis not present

## 2022-08-24 DIAGNOSIS — Z452 Encounter for adjustment and management of vascular access device: Secondary | ICD-10-CM | POA: Diagnosis not present

## 2022-08-24 DIAGNOSIS — Z5189 Encounter for other specified aftercare: Secondary | ICD-10-CM | POA: Diagnosis not present

## 2022-08-24 DIAGNOSIS — C250 Malignant neoplasm of head of pancreas: Secondary | ICD-10-CM | POA: Diagnosis not present

## 2022-08-24 DIAGNOSIS — G893 Neoplasm related pain (acute) (chronic): Secondary | ICD-10-CM | POA: Diagnosis not present

## 2022-08-24 DIAGNOSIS — D709 Neutropenia, unspecified: Secondary | ICD-10-CM | POA: Diagnosis not present

## 2022-08-24 DIAGNOSIS — I81 Portal vein thrombosis: Secondary | ICD-10-CM | POA: Diagnosis not present

## 2022-08-24 DIAGNOSIS — I10 Essential (primary) hypertension: Secondary | ICD-10-CM | POA: Diagnosis not present

## 2022-08-24 DIAGNOSIS — Z5111 Encounter for antineoplastic chemotherapy: Secondary | ICD-10-CM | POA: Diagnosis not present

## 2022-08-24 DIAGNOSIS — C787 Secondary malignant neoplasm of liver and intrahepatic bile duct: Secondary | ICD-10-CM | POA: Diagnosis not present

## 2022-08-24 LAB — CMP (CANCER CENTER ONLY)
ALT: 71 U/L — ABNORMAL HIGH (ref 0–44)
AST: 57 U/L — ABNORMAL HIGH (ref 15–41)
Albumin: 3.9 g/dL (ref 3.5–5.0)
Alkaline Phosphatase: 423 U/L — ABNORMAL HIGH (ref 38–126)
Anion gap: 5 (ref 5–15)
BUN: 13 mg/dL (ref 6–20)
CO2: 28 mmol/L (ref 22–32)
Calcium: 9.7 mg/dL (ref 8.9–10.3)
Chloride: 105 mmol/L (ref 98–111)
Creatinine: 0.69 mg/dL (ref 0.44–1.00)
GFR, Estimated: >60 mL/min (ref >60)
Glucose, Bld: 89 mg/dL (ref 70–99)
Potassium: 3.7 mmol/L (ref 3.5–5.1)
Sodium: 138 mmol/L (ref 135–145)
Total Bilirubin: 0.4 mg/dL (ref 0.3–1.2)
Total Protein: 7.3 g/dL (ref 6.5–8.1)

## 2022-08-24 LAB — CBC WITH DIFFERENTIAL (CANCER CENTER ONLY)
Abs Immature Granulocytes: 0.02 10*3/uL (ref 0.00–0.07)
Basophils Absolute: 0 10*3/uL (ref 0.0–0.1)
Basophils Relative: 1 %
Eosinophils Absolute: 0 10*3/uL (ref 0.0–0.5)
Eosinophils Relative: 0 %
HCT: 30.6 % — ABNORMAL LOW (ref 36.0–46.0)
Hemoglobin: 10 g/dL — ABNORMAL LOW (ref 12.0–15.0)
Immature Granulocytes: 0 %
Lymphocytes Relative: 30 %
Lymphs Abs: 1.5 10*3/uL (ref 0.7–4.0)
MCH: 29.3 pg (ref 26.0–34.0)
MCHC: 32.7 g/dL (ref 30.0–36.0)
MCV: 89.7 fL (ref 80.0–100.0)
Monocytes Absolute: 0.8 10*3/uL (ref 0.1–1.0)
Monocytes Relative: 17 %
Neutro Abs: 2.6 10*3/uL (ref 1.7–7.7)
Neutrophils Relative %: 52 %
Platelet Count: 51 10*3/uL — ABNORMAL LOW (ref 150–400)
RBC: 3.41 MIL/uL — ABNORMAL LOW (ref 3.87–5.11)
RDW: 13.3 % (ref 11.5–15.5)
Smear Review: NORMAL
WBC Count: 4.9 10*3/uL (ref 4.0–10.5)
nRBC: 0 % (ref 0.0–0.2)

## 2022-08-24 MED ORDER — HEPARIN SOD (PORK) LOCK FLUSH 100 UNIT/ML IV SOLN
500.0000 [IU] | Freq: Once | INTRAVENOUS | Status: AC
  Administered 2022-08-24: 500 [IU]

## 2022-08-24 MED ORDER — SODIUM CHLORIDE 0.9% FLUSH
10.0000 mL | Freq: Once | INTRAVENOUS | Status: AC
  Administered 2022-08-24: 10 mL

## 2022-08-28 DIAGNOSIS — C259 Malignant neoplasm of pancreas, unspecified: Secondary | ICD-10-CM | POA: Diagnosis not present

## 2022-08-30 NOTE — Progress Notes (Unsigned)
Palliative Medicine [facility]  [phone] [phone]   Name: Kaitlin Ferrell Date: 08/30/2022 MRN: [id number]  DOB: 1965/12/21  Patient Care Team: Angelica Chessman, MD as PCP - General (Family Medicine) Reece Packer, MD as Consulting Physician (Medical Oncology) Pickenpack-Cousar, Arty Baumgartner, NP as Nurse Practitioner (Nurse Practitioner)    INTERVAL HISTORY: Kaitlin Ferrell is a 57 y.o. 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 Other history includes gestational trophoblastic disease, insomnia, HTN, and migraines. Palliative ask to see for symptom and pain management and goals of care.   SOCIAL HISTORY:     reports that she has never smoked. She has never used smokeless tobacco. She reports current alcohol use of about 1.0 standard drink of alcohol per week. She reports that she does not use drugs.  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  MEDICATIONS:  Current Outpatient Medications  Medication Sig Dispense Refill   amLODipine (NORVASC) 10 MG tablet Take 1 tablet by mouth daily.     apixaban (ELIQUIS) 5 MG TABS tablet Take 1 tablet (5 mg total) by mouth 2 (two) times daily. 60 tablet 2   Cholecalciferol 1.25 MG (50000 UT) capsule Take 50,000 Units by mouth once a week.     citalopram (CELEXA) 10  MG tablet Take by mouth. (Patient not taking: Reported on 06/15/2022)     Cyanocobalamin (VITAMIN B-12 CR PO) Take by mouth.     diphenoxylate-atropine (LOMOTIL) 2.5-0.025 MG tablet Take 1 tablet by mouth 4 (four) times daily as needed for diarrhea or loose stools. 30 tablet 0   ELDERBERRY PO Take by mouth.     eletriptan (RELPAX) 40 MG tablet Take 1 tablet (40 mg total) by mouth as needed for migraine or headache. May repeat in 2 hours if needed. 15 tablet 6   FIBER ADULT GUMMIES PO Take by mouth.     Folic Acid (FOLATE PO) Place 1 mg under the tongue daily.     hydrochlorothiazide (MICROZIDE) 12.5 MG capsule Take 12.5 mg by mouth daily as needed (swelling). (Patient not taking: Reported on 06/15/2022)     lidocaine-prilocaine (EMLA) cream Apply 1 Application topically as needed. 30 g 0   LORazepam (ATIVAN) 0.5 MG tablet Take 0.5 mg by mouth as needed for anxiety.     magic mouthwash (multi-ingredient) oral suspension Swish and swallow 5 mLs by mouth 4 times daily as needed for throat discomfort 400 mL 1   magnesium oxide (MAG-OX) 400 (241.3 MG) MG tablet TAKE 1 TABLET BY MOUTH TWICE A DAY 60 tablet 6   morphine (MS CONTIN) 30 MG 12 hr tablet Take 1 tablet (30 mg total) by mouth every 8 (eight) hours. 90 tablet 0   morphine (MSIR) 15 MG tablet Take 1 tablet (15 mg total) by mouth every 6 (six) hours as needed for severe pain or moderate pain. 45 tablet 0  OLANZapine (ZYPREXA) 10 MG tablet TAKE 1 TABLET BY MOUTH EVERYDAY AT BEDTIME 90 tablet 1   ondansetron (ZOFRAN) 8 MG tablet Take 1 tablet (8 mg total) by mouth every 8 (eight) hours as needed for nausea or vomiting. 90 tablet 0   ondansetron (ZOFRAN-ODT) 8 MG disintegrating tablet Take 1 tablet (8 mg total) by mouth every 8 (eight) hours as needed for nausea or vomiting. 20 tablet 0   promethazine (PHENERGAN) 25 MG tablet Take 1 tablet (25 mg total) by mouth every 6 (six) hours as needed for nausea or vomiting. 30 tablet 0   scopolamine  (TRANSDERM-SCOP) 1 MG/3DAYS Place 1 patch (1.5 mg total) onto the skin every 3 (three) days. 10 patch 12   No current facility-administered medications for this visit.    VITAL SIGNS: LMP 06/26/2013 Comment: low dose BCP There were no vitals filed for this visit.  Estimated body mass index is 23.96 kg/m as calculated from the following:   Height as of 08/17/22: 5\' 2"  (1.575 m).   Weight as of 08/17/22: 131 lb (59.4 kg).   PERFORMANCE STATUS (ECOG) : 1 - Symptomatic but completely ambulatory   Physical Exam General: NAD Cardiovascular: regular rate  Pulmonary: normal breathing pattern  Abdomen: soft, nontender, + bowel sounds Extremities: no edema, no joint deformities Skin: no rashes Neurological: AAO x4  IMPRESSION: I saw Kaitlin Ferrell during her infusion. No acute distress. Is doing well. States she is much appreciative of this. Denies nausea, vomiting, constipation, or diarrhea. Tolerating treatment. Is active.   Neoplasm related pain Kaitlin Ferrell reports pain well controlled on current regimen.    We discussed her current regimen. She is taking MS Contin 30 mg every 8 hours and MS IR as needed. Not requiring breakthrough medication at this time. Taking medications as prescribed. Given pain is controlled no changes to regimen at this time.    We will continue to closely monitor and adjust as needed.    Goals of Care   07/07/22- We discussed her current illness and what it means in the larger context of her on-going co-morbidities. Natural disease trajectory and expectations were discussed.   Patient and husband are realistic in their understanding of her cancer diagnosis and trajectory. She shares they have also discussed at length with her children. Patient states she chose to pursue chemotherapy break due to significance of her symptoms. She was extremely fatigue, weak, could not get out of bed, no appetite, weight loss, nausea, vomiting, and diarrhea. Kaitlin Ferrell shares she was  unable to perform ADLs and husband was having to bathe her. They were concerned that she was facing end-of-life given how poor her quality of life was. Patient and husband speaks to appreciation of her improvement in quality of life over the past several weeks.    I created space and opportunity for patient and husband to discuss goals and hopes. Patient is trying to focus on healthy food options and be as active as she can. They speak to plans of watchful waiting to see what upcoming CT scan results show in terms of her cancer. She will then consider if she would like to pursue additional treatment options versus not. They are clear in their understanding what pros and cons are of pursing or not to pursue further treatment including end-of-life. Kaitlin Ferrell speaks to her quality of life is most important over quantity. She would not want to spend last moments in a stupor suffering state. Emotional support provided.   We discussed Her current  illness and what it means in the larger context of Her on-going co-morbidities. Natural disease trajectory and expectations were discussed.  I discussed the importance of continued conversation with family and their medical providers regarding overall plan of care and treatment options, ensuring decisions are within the context of the patients values and GOCs.  PLAN:  MS Contin 15mg  every 8 hours MS IR for breakthrough pain (not requiring daily) Senna-S for bowel regimen.  I will plan to see patient back in 3-4 weeks in collaboration to other oncology appointments.    Patient expressed understanding and was in agreement with this plan. She also understands that She can call the clinic at any time with any questions, concerns, or complaints.   Any controlled substances utilized were prescribed in the context of palliative care. PDMP has been reviewed.    Visit consisted of counseling and education dealing with the complex and emotionally intense issues of  symptom management and palliative care in the setting of serious and potentially life-threatening illness.Greater than 50%  of this time was spent counseling and coordinating care related to the above assessment and plan.  Kaitlin Ferrell, AGPCNP-BC  Palliative Medicine Team/[facility]  *Please note that this is a verbal dictation therefore any spelling or grammatical errors are due to the "[employer] One" system interpretation.

## 2022-08-31 MED FILL — Fosaprepitant Dimeglumine For IV Infusion 150 MG (Base Eq): INTRAVENOUS | Qty: 5 | Status: AC

## 2022-08-31 MED FILL — Dexamethasone Sodium Phosphate Inj 100 MG/10ML: INTRAMUSCULAR | Qty: 1 | Status: AC

## 2022-09-01 ENCOUNTER — Inpatient Hospital Stay (HOSPITAL_BASED_OUTPATIENT_CLINIC_OR_DEPARTMENT_OTHER): Payer: BC Managed Care – PPO | Admitting: Nurse Practitioner

## 2022-09-01 ENCOUNTER — Inpatient Hospital Stay: Payer: BC Managed Care – PPO | Admitting: Hematology and Oncology

## 2022-09-01 ENCOUNTER — Inpatient Hospital Stay: Payer: BC Managed Care – PPO

## 2022-09-01 ENCOUNTER — Encounter: Payer: Self-pay | Admitting: Nurse Practitioner

## 2022-09-01 ENCOUNTER — Other Ambulatory Visit: Payer: Self-pay

## 2022-09-01 ENCOUNTER — Inpatient Hospital Stay: Payer: BC Managed Care – PPO | Admitting: Dietician

## 2022-09-01 VITALS — BP 132/74 | HR 73 | Temp 98.3°F | Resp 14 | Wt 131.3 lb

## 2022-09-01 DIAGNOSIS — Z515 Encounter for palliative care: Secondary | ICD-10-CM | POA: Diagnosis not present

## 2022-09-01 DIAGNOSIS — I81 Portal vein thrombosis: Secondary | ICD-10-CM | POA: Diagnosis not present

## 2022-09-01 DIAGNOSIS — Z86718 Personal history of other venous thrombosis and embolism: Secondary | ICD-10-CM | POA: Diagnosis not present

## 2022-09-01 DIAGNOSIS — G893 Neoplasm related pain (acute) (chronic): Secondary | ICD-10-CM | POA: Diagnosis not present

## 2022-09-01 DIAGNOSIS — C259 Malignant neoplasm of pancreas, unspecified: Secondary | ICD-10-CM | POA: Diagnosis not present

## 2022-09-01 DIAGNOSIS — Z95828 Presence of other vascular implants and grafts: Secondary | ICD-10-CM

## 2022-09-01 DIAGNOSIS — C787 Secondary malignant neoplasm of liver and intrahepatic bile duct: Secondary | ICD-10-CM | POA: Diagnosis not present

## 2022-09-01 DIAGNOSIS — Z5189 Encounter for other specified aftercare: Secondary | ICD-10-CM | POA: Diagnosis not present

## 2022-09-01 DIAGNOSIS — Z5111 Encounter for antineoplastic chemotherapy: Secondary | ICD-10-CM | POA: Diagnosis not present

## 2022-09-01 DIAGNOSIS — Z7901 Long term (current) use of anticoagulants: Secondary | ICD-10-CM | POA: Diagnosis not present

## 2022-09-01 DIAGNOSIS — I10 Essential (primary) hypertension: Secondary | ICD-10-CM | POA: Diagnosis not present

## 2022-09-01 DIAGNOSIS — R7989 Other specified abnormal findings of blood chemistry: Secondary | ICD-10-CM | POA: Diagnosis not present

## 2022-09-01 DIAGNOSIS — Z452 Encounter for adjustment and management of vascular access device: Secondary | ICD-10-CM | POA: Diagnosis not present

## 2022-09-01 DIAGNOSIS — D709 Neutropenia, unspecified: Secondary | ICD-10-CM | POA: Diagnosis not present

## 2022-09-01 DIAGNOSIS — C250 Malignant neoplasm of head of pancreas: Secondary | ICD-10-CM | POA: Diagnosis not present

## 2022-09-01 DIAGNOSIS — R112 Nausea with vomiting, unspecified: Secondary | ICD-10-CM | POA: Diagnosis not present

## 2022-09-01 LAB — CBC WITH DIFFERENTIAL (CANCER CENTER ONLY)
Abs Immature Granulocytes: 0.03 10*3/uL (ref 0.00–0.07)
Basophils Absolute: 0 10*3/uL (ref 0.0–0.1)
Basophils Relative: 0 %
Eosinophils Absolute: 0.1 10*3/uL (ref 0.0–0.5)
Eosinophils Relative: 1 %
HCT: 29.8 % — ABNORMAL LOW (ref 36.0–46.0)
Hemoglobin: 9.6 g/dL — ABNORMAL LOW (ref 12.0–15.0)
Immature Granulocytes: 1 %
Lymphocytes Relative: 27 %
Lymphs Abs: 1.8 10*3/uL (ref 0.7–4.0)
MCH: 28.9 pg (ref 26.0–34.0)
MCHC: 32.2 g/dL (ref 30.0–36.0)
MCV: 89.8 fL (ref 80.0–100.0)
Monocytes Absolute: 1.2 10*3/uL — ABNORMAL HIGH (ref 0.1–1.0)
Monocytes Relative: 18 %
Neutro Abs: 3.4 10*3/uL (ref 1.7–7.7)
Neutrophils Relative %: 53 %
Platelet Count: 117 10*3/uL — ABNORMAL LOW (ref 150–400)
RBC: 3.32 MIL/uL — ABNORMAL LOW (ref 3.87–5.11)
RDW: 14.3 % (ref 11.5–15.5)
WBC Count: 6.4 10*3/uL (ref 4.0–10.5)
nRBC: 0 % (ref 0.0–0.2)

## 2022-09-01 LAB — CMP (CANCER CENTER ONLY)
ALT: 31 U/L (ref 0–44)
AST: 29 U/L (ref 15–41)
Albumin: 3.6 g/dL (ref 3.5–5.0)
Alkaline Phosphatase: 303 U/L — ABNORMAL HIGH (ref 38–126)
Anion gap: 6 (ref 5–15)
BUN: 6 mg/dL (ref 6–20)
CO2: 29 mmol/L (ref 22–32)
Calcium: 9.4 mg/dL (ref 8.9–10.3)
Chloride: 106 mmol/L (ref 98–111)
Creatinine: 0.71 mg/dL (ref 0.44–1.00)
GFR, Estimated: >60 mL/min (ref >60)
Glucose, Bld: 91 mg/dL (ref 70–99)
Potassium: 3.4 mmol/L — ABNORMAL LOW (ref 3.5–5.1)
Sodium: 141 mmol/L (ref 135–145)
Total Bilirubin: 0.3 mg/dL (ref 0.3–1.2)
Total Protein: 6.5 g/dL (ref 6.5–8.1)

## 2022-09-01 MED ORDER — DEXTROSE 5 % IV SOLN: Freq: Once | INTRAVENOUS | Status: AC

## 2022-09-01 MED ORDER — PALONOSETRON HCL INJECTION 0.25 MG/5ML
0.2500 mg | Freq: Once | INTRAVENOUS | Status: AC
  Administered 2022-09-01: 0.25 mg via INTRAVENOUS
  Filled 2022-09-01: qty 5

## 2022-09-01 MED ORDER — SODIUM CHLORIDE 0.9% FLUSH: 10.0000 mL | INTRAVENOUS | Status: DC | PRN

## 2022-09-01 MED ORDER — HEPARIN SOD (PORK) LOCK FLUSH 100 UNIT/ML IV SOLN: 500.0000 [IU] | Freq: Once | INTRAVENOUS | Status: DC | PRN

## 2022-09-01 MED ORDER — SODIUM CHLORIDE 0.9 % IV SOLN
10.0000 mg | Freq: Once | INTRAVENOUS | Status: AC
  Administered 2022-09-01: 10 mg via INTRAVENOUS
  Filled 2022-09-01: qty 10

## 2022-09-01 MED ORDER — SODIUM CHLORIDE 0.9 % IV SOLN
125.0000 mg/m2 | Freq: Once | INTRAVENOUS | Status: AC
  Administered 2022-09-01: 200 mg via INTRAVENOUS
  Filled 2022-09-01: qty 10

## 2022-09-01 MED ORDER — OXALIPLATIN CHEMO INJECTION 100 MG/20ML
85.0000 mg/m2 | Freq: Once | INTRAVENOUS | Status: AC
  Administered 2022-09-01: 150 mg via INTRAVENOUS
  Filled 2022-09-01: qty 10

## 2022-09-01 MED ORDER — SODIUM CHLORIDE 0.9 % IV SOLN
400.0000 mg/m2 | Freq: Once | INTRAVENOUS | Status: AC
  Administered 2022-09-01: 712 mg via INTRAVENOUS
  Filled 2022-09-01: qty 35.6

## 2022-09-01 MED ORDER — SODIUM CHLORIDE 0.9 % IV SOLN
150.0000 mg | Freq: Once | INTRAVENOUS | Status: AC
  Administered 2022-09-01: 150 mg via INTRAVENOUS
  Filled 2022-09-01: qty 150

## 2022-09-01 MED ORDER — ATROPINE SULFATE 1 MG/ML IV SOLN
0.5000 mg | Freq: Once | INTRAVENOUS | Status: AC | PRN
  Administered 2022-09-01: 0.5 mg via INTRAVENOUS
  Filled 2022-09-01: qty 1

## 2022-09-01 MED ORDER — SODIUM CHLORIDE 0.9% FLUSH
10.0000 mL | Freq: Once | INTRAVENOUS | Status: AC
  Administered 2022-09-01: 10 mL

## 2022-09-01 MED ORDER — SODIUM CHLORIDE 0.9 % IV SOLN
2400.0000 mg/m2 | INTRAVENOUS | Status: DC
  Administered 2022-09-01: 4250 mg via INTRAVENOUS
  Filled 2022-09-01: qty 85

## 2022-09-01 NOTE — Progress Notes (Signed)
Nutrition Follow-up:  Patient with pancreatic carcinoma metastatic to liver. She is receiving modified Folfirinox q14d (start 2/20)   Met with patient in infusion. She reports doing well overall. Her appetite fairly good. Patient reports bland foods work best for at this time. She continues working on increasing intake of water. This does not taste good. Patient is planning to purchase a brita water filter to see if that helps with taste. Nausea is well controlled with antiemetics. She denies constipation or diarrhea.    Medications: reviewed   Labs: K 3.4, Hgb 9.6  Anthropometrics: Wt 131 lb 4.8 oz today - stable  7/10 - 131 lb 6/27 - 132 lb 3.2 oz 5/20 - 138 lb 11.2 oz    NUTRITION DIAGNOSIS: Unintended wt loss stable   INTERVENTION:  Continue strategies for increasing calories and protein with small frequent meals/snacks Continue CIB 1-2 daily    MONITORING, EVALUATION, GOAL: weight trends, intake  NEXT VISIT: Thursday August 22 during infusion

## 2022-09-01 NOTE — Patient Instructions (Signed)
[facility]  Discharge Instructions: Thank you for choosing [facility] to provide your oncology and hematology care.   If you have a lab appointment with the Cancer Center, please go directly to the Cancer Center and check in at the registration area.   Wear comfortable clothing and clothing appropriate for easy access to any Portacath or PICC line.   We strive to give you quality time with your provider. You may need to reschedule your appointment if you arrive late (15 or more minutes).  Arriving late affects you and other patients whose appointments are after yours.  Also, if you miss three or more appointments without notifying the office, you may be dismissed from the clinic at the provider's discretion.      For prescription refill requests, have your pharmacy contact our office and allow 72 hours for refills to be completed.    Today you received the following chemotherapy and/or immunotherapy agents Oaxaliplatin, Irinotecan, Leucovorin, 5FU.      To help prevent nausea and vomiting after your treatment, we encourage you to take your nausea medication as directed.  BELOW ARE SYMPTOMS THAT SHOULD BE REPORTED IMMEDIATELY: *FEVER GREATER THAN 100.4 F (38 C) OR HIGHER *CHILLS OR SWEATING *NAUSEA AND VOMITING THAT IS NOT CONTROLLED WITH YOUR NAUSEA MEDICATION *UNUSUAL SHORTNESS OF BREATH *UNUSUAL BRUISING OR BLEEDING *URINARY PROBLEMS (pain or burning when urinating, or frequent urination) *BOWEL PROBLEMS (unusual diarrhea, constipation, pain near the anus) TENDERNESS IN MOUTH AND THROAT WITH OR WITHOUT PRESENCE OF ULCERS (sore throat, sores in mouth, or a toothache) UNUSUAL RASH, SWELLING OR PAIN  UNUSUAL VAGINAL DISCHARGE OR ITCHING   Items with * indicate a potential emergency and should be followed up as soon as possible or go to the Emergency Department if any problems should occur.  Please show the CHEMOTHERAPY ALERT CARD or  IMMUNOTHERAPY ALERT CARD at check-in to the Emergency Department and triage nurse.  Should you have questions after your visit or need to cancel or reschedule your appointment, please contact [facility]  Dept: [phone]  and follow the prompts.  Office hours are 8:00 a.m. to 4:30 p.m. Monday - Friday. Please note that voicemails left after 4:00 p.m. may not be returned until the following business day.  We are closed weekends and major holidays. You have access to a nurse at all times for urgent questions. Please call the main number to the clinic Dept: [phone] and follow the prompts.   For any non-urgent questions, you may also contact your provider using MyChart. We now offer e-Visits for anyone 58 and older to request care online for non-urgent symptoms. For details visit [email or web address].   Also download the MyChart app! Go to the app store, search "MyChart", open the app, select [facility], and log in with your MyChart username and password.

## 2022-09-01 NOTE — Progress Notes (Signed)
[facility] [phone]   [phone]  PROGRESS NOTE:  Patient Care Team: Angelica Chessman, MD as PCP - General (Family Medicine) Reece Packer, MD as Consulting Physician (Medical Oncology) Pickenpack-Cousar, Arty Baumgartner, NP as Nurse Practitioner (Nurse Practitioner)  CHIEF COMPLAINTS/PURPOSE OF CONSULTATION:  Metastatic pancreatic adenocarcinoma involving the liver  ONCOLOGIC HISTORY: Presented with nausea and right upper quadrant pain 03/08/2022: Abdominal US showed multiple hypoechoic masses within the liver are indeterminate.  03/10/2022: MR abdomen: Hypoenhancing mass compatible with pancreatic head adenocarcinoma with numerous targetoid enhancing masses in all segments of the liver, compatible with metastatic disease. The mass severely effaces and nearly occludes the portal vein and splenic vein, and occludes the superior mesenteric vein with some collaterals noted. The mass substantially abuts the proximal transverse duodenum, although overt duodenal invasion is indeterminate. 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 Prominent stool throughout the colon favors constipation. Fluid-fluid level in the gallbladder likely from sludge. 03/14/2022: Establish care with [facility] Hematology/Oncology 03/17/2022: CT chest: no definitive evidence of lung metastases.  03/22/2022: Liver biopsy confirmed metastatic adenocarcinoma, pancreatic primary.  03/30/2022: Cycle 1, Day 1 of mFOLFIRINOX 04/14/2022: Cycle 2, Day 1 of mFOLFIRINOX HELD due to ANC 300.  04/20/2022: Cycle 2, Day 1 of mFOLFIRINOX 05/04/2022: Cycle 3, Day 1 of  mFOLFIRINOX 05/19/2022: Cycle 4, Day 1 of mFOLFIRINOX HELD per patient request.  07/21/2022: Cycle 4, Day 1 of mFOLFIRINOX 08/04/2022: Cycle 5, Day 1 of mFOLFIRINOX HELD due to Plt 81K 08/17/2022: Cycle 5, Day 1 of mFOLFIRINOX 09/01/2022: Cycle 6, Day 1 of mFOLFIRINOX   HISTORY OF PRESENTING ILLNESS:  Kaitlin Ferrell presents today for a follow up. She was last seen on 08/17/2022. In the interim, she has resumed chemotherapy and presents today for Cycle 6, Day 1.   Kaitlin Ferrell reports she feels well overall.  Her energy levels are good though she does have "moments of fatigue".  She does take a daily nap.  Her appetite is also pretty strong and her weight is up 2 pounds from her last visit.  She reports that she is try to eat more frequent smaller meals.  She is also trying to eat more in the way of red meat and keep up with hydration.  Overall she is willing and able to proceed with chemotherapy at this time.  She is not having any other major side effects as result of her treatment.  She denies fevers, chills, sweats, shortness of breath, chest pain or cough. She has no other complaints. Rest of the 10 point ROS is below.   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   treated with 4 cycles of actinomycin D from 12-02-11 thru 01-13-12   H/O molar pregnancy, antepartum    Hypertension    Migraine    Miscarriage     SURGICAL HISTORY: Past Surgical History:  Procedure Laterality Date   BREAST BIOPSY  2001   Left   BREAST LUMPECTOMY     DILATION AND CURETTAGE  OF UTERUS  1992, 09/2011   DILATION AND EVACUATION  2011   INSERTION OF MESH N/A 09/03/2013   Procedure: INSERTION OF MESH;  Surgeon: Ardeth Sportsman, MD;  Location: [facility];  Service: General;  Laterality: N/A;   IR CHEST FLUORO   04/14/2022   IR IMAGING GUIDED PORT INSERTION  03/22/2022   IR IMAGING GUIDED PORT INSERTION  05/02/2022   IR PORT REPAIR CENTRAL VENOUS ACCESS DEVICE  04/19/2022   IR REMOVAL TUN CV CATH W/O FL  05/02/2022   IR US LIVER BIOPSY  03/22/2022   PORTACATH PLACEMENT  11/2011   PORTACATH PLACEMENT     REMOVAL  MARCH 2014   VENTRAL HERNIA REPAIR N/A 09/03/2013   Procedure: LAPAROSCOPIC VENTRAL WALL HERNIA REPAIR;  Surgeon: Ardeth Sportsman, MD;  Location: [facility];  Service: General;  Laterality: N/A;   WISDOM TOOTH EXTRACTION     in 11th grade    SOCIAL HISTORY: Social History   Socioeconomic History   Marital status: Married    Spouse name: [name]   Number of children: 2   Years of education: college   Highest education level: Not on file  Occupational History    Comment: [employer]  Tobacco Use   Smoking status: Never   Smokeless tobacco: Never  Substance and Sexual Activity   Alcohol use: Yes    Alcohol/week: 1.0 standard drink of alcohol    Types: 1 Glasses of wine per week    Comment: occas   Drug use: No   Sexual activity: Not Currently    Birth control/protection: Pill  Other Topics Concern   Not on file  Social History Narrative   Patient is married [name]). Patient works part time at [employer].   Right handed.   Patient has her CMA.   Caffeine- None         Social Determinants of Health   Financial Resource Strain: Not on file  Food Insecurity: No Food Insecurity (05/10/2022)   Hunger Vital Sign    Worried About Running Out of Food in the Last Year: Never true    Ran Out of Food in the Last Year: Never true  Transportation Needs: No Transportation Needs (05/10/2022)   PRAPARE - Administrator, Civil Service (Medical): No    Lack of Transportation (Non-Medical): No  Physical Activity: Not on file  Stress: Not on file  Social Connections: Not on file  Intimate Partner Violence: Not At Risk (05/10/2022)   Humiliation, Afraid, Rape, and Kick questionnaire     Fear of Current or Ex-Partner: No    Emotionally Abused: No    Physically Abused: No    Sexually Abused: No    FAMILY HISTORY: Family History  Problem Relation Age of Onset   Breast cancer Mother        dx > 50   Heart attack Mother    Diabetes Father    Breast cancer Maternal Aunt        dx > 50   Breast cancer Maternal Aunt        dx > 50   Breast cancer Maternal Grandmother        ? < 50   Stroke Paternal Grandmother    Alcoholism Other     ALLERGIES:  is allergic to compazine [prochlorperazine edisylate], propofol, oxycodone-acetaminophen, prochlorperazine, and labetalol.  MEDICATIONS:  Current Outpatient Medications  Medication Sig Dispense Refill   amLODipine (NORVASC) 10 MG tablet Take 1 tablet by mouth  daily.     apixaban (ELIQUIS) 5 MG TABS tablet Take 1 tablet (5 mg total) by mouth 2 (two) times daily. 60 tablet 2   Cholecalciferol 1.25 MG (50000 UT) capsule Take 50,000 Units by mouth once a week.     citalopram (CELEXA) 10 MG tablet Take by mouth. (Patient not taking: Reported on 06/15/2022)     Cyanocobalamin (VITAMIN B-12 CR PO) Take by mouth.     diphenoxylate-atropine (LOMOTIL) 2.5-0.025 MG tablet Take 1 tablet by mouth 4 (four) times daily as needed for diarrhea or loose stools. 30 tablet 0   ELDERBERRY PO Take by mouth.     eletriptan (RELPAX) 40 MG tablet Take 1 tablet (40 mg total) by mouth as needed for migraine or headache. May repeat in 2 hours if needed. 15 tablet 6   FIBER ADULT GUMMIES PO Take by mouth.     Folic Acid (FOLATE PO) Place 1 mg under the tongue daily.     hydrochlorothiazide (MICROZIDE) 12.5 MG capsule Take 12.5 mg by mouth daily as needed (swelling). (Patient not taking: Reported on 06/15/2022)     lidocaine-prilocaine (EMLA) cream Apply 1 Application topically as needed. 30 g 0   LORazepam (ATIVAN) 0.5 MG tablet Take 0.5 mg by mouth as needed for anxiety.     magic mouthwash (multi-ingredient) oral suspension Swish and swallow 5 mLs by  mouth 4 times daily as needed for throat discomfort 400 mL 1   magnesium oxide (MAG-OX) 400 (241.3 MG) MG tablet TAKE 1 TABLET BY MOUTH TWICE A DAY 60 tablet 6   morphine (MS CONTIN) 30 MG 12 hr tablet Take 1 tablet (30 mg total) by mouth every 8 (eight) hours. 90 tablet 0   morphine (MSIR) 15 MG tablet Take 1 tablet (15 mg total) by mouth every 6 (six) hours as needed for severe pain or moderate pain. 45 tablet 0   OLANZapine (ZYPREXA) 10 MG tablet TAKE 1 TABLET BY MOUTH EVERYDAY AT BEDTIME 90 tablet 1   ondansetron (ZOFRAN) 8 MG tablet Take 1 tablet (8 mg total) by mouth every 8 (eight) hours as needed for nausea or vomiting. 90 tablet 0   ondansetron (ZOFRAN-ODT) 8 MG disintegrating tablet Take 1 tablet (8 mg total) by mouth every 8 (eight) hours as needed for nausea or vomiting. 20 tablet 0   promethazine (PHENERGAN) 25 MG tablet Take 1 tablet (25 mg total) by mouth every 6 (six) hours as needed for nausea or vomiting. 30 tablet 0   scopolamine (TRANSDERM-SCOP) 1 MG/3DAYS Place 1 patch (1.5 mg total) onto the skin every 3 (three) days. 10 patch 12   No current facility-administered medications for this visit.    REVIEW OF SYSTEMS:   Constitutional: ( - ) fevers, ( - )  chills , ( - ) night sweats Eyes: ( - ) blurriness of vision, ( - ) double vision, ( - ) watery eyes Ears, nose, mouth, throat, and face: ( - ) mucositis, ( - ) sore throat Respiratory: ( - ) cough, ( - ) dyspnea, ( - ) wheezes Cardiovascular: ( - ) palpitation, ( - ) chest discomfort, ( - ) lower extremity swelling Gastrointestinal:  (+) nausea, ( - ) heartburn, ( -) change in bowel habits Skin: ( - ) abnormal skin rashes Lymphatics: ( - ) new lymphadenopathy, ( - ) easy bruising Neurological: ( - ) numbness, ( - ) tingling, ( - ) new weaknesses Behavioral/Psych: ( - ) mood change, ( - ) new changes  All other systems were reviewed with the patient and are negative.  PHYSICAL EXAMINATION: ECOG PERFORMANCE STATUS: 1 -  Symptomatic but completely ambulatory  Vitals:   09/01/22 1005  BP: 132/74  Pulse: 73  Resp: 14  Temp: 98.3 F (36.8 C)  SpO2: 99%     Filed Weights   09/01/22 1005  Weight: 131 lb 4.8 oz (59.6 kg)     08/04/22  Height 5\' 2"  (1.575 m)  Weight 132 lb 3.2 oz (60 kg)  BSA (Calculated - sq m) 1.62 sq meters  Temp 98.3 F (36.8 C)  Temp src Oral  Pulse 86  Resp 16  BP 143/88 !  !: Data is abnormal  GENERAL: well appearing female in NAD  SKIN: skin color, texture, turgor are normal, no rashes or significant lesions EYES: conjunctiva are pink and non-injected, sclera clear LUNGS: clear to auscultation and percussion with normal breathing effort HEART: regular rate & rhythm and no murmurs and no lower extremity edema Musculoskeletal: no cyanosis of digits and no clubbing  PSYCH: alert & oriented x 3, fluent speech NEURO: no focal motor/sensory deficits  LABORATORY DATA:  I have reviewed the data as listed    Latest Ref Rng & Units 09/01/2022    9:28 AM 08/24/2022   10:10 AM 08/17/2022    8:32 AM  CBC  WBC 4.0 - 10.5 K/uL 6.4  4.9  5.0   Hemoglobin 12.0 - 15.0 g/dL 9.6  16.1  09.6   Hematocrit 36.0 - 46.0 % 29.8  30.6  32.1   Platelets 150 - 400 K/uL 117  51  285        Latest Ref Rng & Units 09/01/2022    9:28 AM 08/24/2022   10:10 AM 08/17/2022    8:32 AM  CMP  Glucose 70 - 99 mg/dL 91  89  94   BUN 6 - 20 mg/dL 6  13  14    Creatinine 0.44 - 1.00 mg/dL 0.45  4.09  8.11   Sodium 135 - 145 mmol/L 141  138  141   Potassium 3.5 - 5.1 mmol/L 3.4  3.7  3.6   Chloride 98 - 111 mmol/L 106  105  106   CO2 22 - 32 mmol/L 29  28  29    Calcium 8.9 - 10.3 mg/dL 9.4  9.7  9.6   Total Protein 6.5 - 8.1 g/dL 6.5  7.3  7.7   Total Bilirubin 0.3 - 1.2 mg/dL 0.3  0.4  0.4   Alkaline Phos 38 - 126 U/L 303  423  647   AST 15 - 41 U/L 29  57  163   ALT 0 - 44 U/L 31  71  110      RADIOGRAPHIC STUDIES: I have personally reviewed the radiological images as listed and agreed  with the findings in the report. No results found.  ASSESSMENT & PLAN Kaitlin Ferrell is a 57 y.o. female who returns to the clinic for newly diagnosed pancreatic adenocarcinoma.    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 Dr. Leonides Schanz recommends chemotherapy regimen with FOLFIRINOX q 2 weeks.  --Plan to obtain restaging CT scans every 3 months to assess treatment response.  --[facility] Testing: MS-stable, 2 Muts/Mb, [id number]*31, KRASG12L, MDM2 amplification, CDKN2A/B. No targetable mutations.  --Started mFOLFIRINOX on 03/30/2022 --  After Cycle 3 on 05/04/2022, patient was on a treatment break due to poor tolerance and patient's request. --Most recent CT CAP from 06/30/2022 showed treatment response. --Patient resumed FOLFIRINOX without dose modification on 07/21/2022  PLAN: --Due for Cycle 6, Day 1 of FOLFIRINOX today --Labs from today reviewed with patient.  White blood cell 6.4, hemoglobin 9.6, MCV 89.8, and platelets of 117 -- Discussed need for dose reduction to prevent further cytopenias and rise in LFTs. Patient agreed for dose reduction. We will dose reduce irinotecan by 25% and consider further reduction in the future.  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  --Currently on Eliquis 5 mg BID  #Neutropenia--resolved --Added Udenyca on Day 3 with Cycle 2.  --Neutropenic precautions given to patient including monitoring for  fevers.   #Elevated LFTs-normalized --ALT and ALT normalized (bumped on 08/17/2022), Alk phos 303 --Most likely secondary to chemotherapy --Monitor for now  #Nausea/Vomiting: --Secondary to chemotherapy -- Continue to take scopolamine patch, phenergan 25 mg q 6 hours, zofran 8mg  q8H PRN for nausea, zyprexa nightly PRN for nausea.   #Epigastric/RUQ/back pain: --Likely secondary to underlying malignancy --Tramadol was ineffective.  --Current pain regimen includes MS contin to 30 mg q 12 hours and Norco 10-325 mg.   #Appetite loss/weight loss: --Lost 8 lbs since 07/21/2022 --Encouraged to eat small, frequent meals and supplement with protein shakes --will request a follow up with [facility] nutrition team  #Family history of breast cancer: --Due to strong family history of cancer and has undergone genetic testing in the past.  --Underwent genetic testing on 04/07/2022 that was negative. 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   #Supportive Care -- chemotherapy education to be scheduled  -- port placement complete  -- EMLA cream for port  No orders of the defined types were placed in this encounter.  All questions were answered. The patient knows to call the clinic with any problems, questions or concerns.  I have spent a total of 30 minutes minutes of face-to-face and non-face-to-face time, preparing to see the patient,  performing a medically appropriate examination, counseling and educating the patient, documenting clinical information in the electronic health record,  and care coordination.   Ulysees Barns, MD Department of Hematology/Oncology [facility] at [facility] Phone: [phone] Pager: [phone] Email: [email or web address]

## 2022-09-03 ENCOUNTER — Inpatient Hospital Stay: Payer: BC Managed Care – PPO

## 2022-09-03 VITALS — BP 125/76 | HR 63 | Resp 16

## 2022-09-03 DIAGNOSIS — Z515 Encounter for palliative care: Secondary | ICD-10-CM | POA: Diagnosis not present

## 2022-09-03 DIAGNOSIS — Z86718 Personal history of other venous thrombosis and embolism: Secondary | ICD-10-CM | POA: Diagnosis not present

## 2022-09-03 DIAGNOSIS — C787 Secondary malignant neoplasm of liver and intrahepatic bile duct: Secondary | ICD-10-CM | POA: Diagnosis not present

## 2022-09-03 DIAGNOSIS — I81 Portal vein thrombosis: Secondary | ICD-10-CM | POA: Diagnosis not present

## 2022-09-03 DIAGNOSIS — R112 Nausea with vomiting, unspecified: Secondary | ICD-10-CM | POA: Diagnosis not present

## 2022-09-03 DIAGNOSIS — C250 Malignant neoplasm of head of pancreas: Secondary | ICD-10-CM | POA: Diagnosis not present

## 2022-09-03 DIAGNOSIS — C259 Malignant neoplasm of pancreas, unspecified: Secondary | ICD-10-CM

## 2022-09-03 DIAGNOSIS — D709 Neutropenia, unspecified: Secondary | ICD-10-CM | POA: Diagnosis not present

## 2022-09-03 DIAGNOSIS — Z5189 Encounter for other specified aftercare: Secondary | ICD-10-CM | POA: Diagnosis not present

## 2022-09-03 DIAGNOSIS — Z7901 Long term (current) use of anticoagulants: Secondary | ICD-10-CM | POA: Diagnosis not present

## 2022-09-03 DIAGNOSIS — Z5111 Encounter for antineoplastic chemotherapy: Secondary | ICD-10-CM | POA: Diagnosis not present

## 2022-09-03 DIAGNOSIS — R7989 Other specified abnormal findings of blood chemistry: Secondary | ICD-10-CM | POA: Diagnosis not present

## 2022-09-03 DIAGNOSIS — G893 Neoplasm related pain (acute) (chronic): Secondary | ICD-10-CM | POA: Diagnosis not present

## 2022-09-03 DIAGNOSIS — I10 Essential (primary) hypertension: Secondary | ICD-10-CM | POA: Diagnosis not present

## 2022-09-03 DIAGNOSIS — Z452 Encounter for adjustment and management of vascular access device: Secondary | ICD-10-CM | POA: Diagnosis not present

## 2022-09-03 MED ORDER — PEGFILGRASTIM-CBQV 6 MG/0.6ML ~~LOC~~ SOSY
6.0000 mg | PREFILLED_SYRINGE | Freq: Once | SUBCUTANEOUS | Status: AC
  Administered 2022-09-03: 6 mg via SUBCUTANEOUS

## 2022-09-03 MED ORDER — HEPARIN SOD (PORK) LOCK FLUSH 100 UNIT/ML IV SOLN
500.0000 [IU] | Freq: Once | INTRAVENOUS | Status: AC | PRN
  Administered 2022-09-03: 500 [IU]

## 2022-09-03 MED ORDER — SODIUM CHLORIDE 0.9% FLUSH
10.0000 mL | INTRAVENOUS | Status: DC | PRN
  Administered 2022-09-03: 10 mL

## 2022-09-04 ENCOUNTER — Encounter: Payer: Self-pay | Admitting: Hematology and Oncology

## 2022-09-13 MED FILL — Fosaprepitant Dimeglumine For IV Infusion 150 MG (Base Eq): INTRAVENOUS | Qty: 5 | Status: AC

## 2022-09-13 MED FILL — Dexamethasone Sodium Phosphate Inj 100 MG/10ML: INTRAMUSCULAR | Qty: 1 | Status: AC

## 2022-09-14 ENCOUNTER — Other Ambulatory Visit: Payer: Self-pay | Admitting: Physician Assistant

## 2022-09-14 ENCOUNTER — Inpatient Hospital Stay: Payer: BC Managed Care – PPO | Attending: Physician Assistant | Admitting: Physician Assistant

## 2022-09-14 ENCOUNTER — Telehealth: Payer: Self-pay

## 2022-09-14 ENCOUNTER — Inpatient Hospital Stay: Payer: BC Managed Care – PPO

## 2022-09-14 ENCOUNTER — Other Ambulatory Visit: Payer: Self-pay | Admitting: Hematology and Oncology

## 2022-09-14 ENCOUNTER — Other Ambulatory Visit: Payer: Self-pay

## 2022-09-14 VITALS — BP 116/73 | HR 99 | Temp 98.3°F | Resp 16 | Wt 123.3 lb

## 2022-09-14 DIAGNOSIS — C259 Malignant neoplasm of pancreas, unspecified: Secondary | ICD-10-CM

## 2022-09-14 DIAGNOSIS — Z7901 Long term (current) use of anticoagulants: Secondary | ICD-10-CM | POA: Insufficient documentation

## 2022-09-14 DIAGNOSIS — G893 Neoplasm related pain (acute) (chronic): Secondary | ICD-10-CM | POA: Insufficient documentation

## 2022-09-14 DIAGNOSIS — I10 Essential (primary) hypertension: Secondary | ICD-10-CM | POA: Diagnosis not present

## 2022-09-14 DIAGNOSIS — R112 Nausea with vomiting, unspecified: Secondary | ICD-10-CM | POA: Insufficient documentation

## 2022-09-14 DIAGNOSIS — D696 Thrombocytopenia, unspecified: Secondary | ICD-10-CM | POA: Diagnosis not present

## 2022-09-14 DIAGNOSIS — Z86718 Personal history of other venous thrombosis and embolism: Secondary | ICD-10-CM | POA: Diagnosis not present

## 2022-09-14 DIAGNOSIS — Z452 Encounter for adjustment and management of vascular access device: Secondary | ICD-10-CM | POA: Diagnosis not present

## 2022-09-14 DIAGNOSIS — C250 Malignant neoplasm of head of pancreas: Secondary | ICD-10-CM | POA: Diagnosis not present

## 2022-09-14 DIAGNOSIS — I81 Portal vein thrombosis: Secondary | ICD-10-CM | POA: Insufficient documentation

## 2022-09-14 DIAGNOSIS — Z95828 Presence of other vascular implants and grafts: Secondary | ICD-10-CM

## 2022-09-14 DIAGNOSIS — Z515 Encounter for palliative care: Secondary | ICD-10-CM | POA: Diagnosis not present

## 2022-09-14 DIAGNOSIS — R7989 Other specified abnormal findings of blood chemistry: Secondary | ICD-10-CM | POA: Insufficient documentation

## 2022-09-14 DIAGNOSIS — E876 Hypokalemia: Secondary | ICD-10-CM

## 2022-09-14 DIAGNOSIS — C787 Secondary malignant neoplasm of liver and intrahepatic bile duct: Secondary | ICD-10-CM | POA: Diagnosis not present

## 2022-09-14 DIAGNOSIS — Z5111 Encounter for antineoplastic chemotherapy: Secondary | ICD-10-CM | POA: Diagnosis not present

## 2022-09-14 LAB — CMP (CANCER CENTER ONLY)
ALT: 16 U/L (ref 0–44)
AST: 27 U/L (ref 15–41)
Albumin: 4.1 g/dL (ref 3.5–5.0)
Alkaline Phosphatase: 227 U/L — ABNORMAL HIGH (ref 38–126)
Anion gap: 8 (ref 5–15)
BUN: 12 mg/dL (ref 6–20)
CO2: 29 mmol/L (ref 22–32)
Calcium: 9.8 mg/dL (ref 8.9–10.3)
Chloride: 103 mmol/L (ref 98–111)
Creatinine: 0.55 mg/dL (ref 0.44–1.00)
GFR, Estimated: >60 mL/min (ref >60)
Glucose, Bld: 121 mg/dL — ABNORMAL HIGH (ref 70–99)
Potassium: 3.3 mmol/L — ABNORMAL LOW (ref 3.5–5.1)
Sodium: 140 mmol/L (ref 135–145)
Total Bilirubin: 0.5 mg/dL (ref 0.3–1.2)
Total Protein: 7.8 g/dL (ref 6.5–8.1)

## 2022-09-14 LAB — CBC WITH DIFFERENTIAL (CANCER CENTER ONLY)
Abs Immature Granulocytes: 0.03 10*3/uL (ref 0.00–0.07)
Basophils Absolute: 0 10*3/uL (ref 0.0–0.1)
Basophils Relative: 1 %
Eosinophils Absolute: 0 10*3/uL (ref 0.0–0.5)
Eosinophils Relative: 1 %
HCT: 28.2 % — ABNORMAL LOW (ref 36.0–46.0)
Hemoglobin: 9.2 g/dL — ABNORMAL LOW (ref 12.0–15.0)
Immature Granulocytes: 1 %
Lymphocytes Relative: 25 %
Lymphs Abs: 1.4 10*3/uL (ref 0.7–4.0)
MCH: 28.4 pg (ref 26.0–34.0)
MCHC: 32.6 g/dL (ref 30.0–36.0)
MCV: 87 fL (ref 80.0–100.0)
Monocytes Absolute: 0.8 10*3/uL (ref 0.1–1.0)
Monocytes Relative: 15 %
Neutro Abs: 3.2 10*3/uL (ref 1.7–7.7)
Neutrophils Relative %: 57 %
Platelet Count: 31 10*3/uL — ABNORMAL LOW (ref 150–400)
RBC: 3.24 MIL/uL — ABNORMAL LOW (ref 3.87–5.11)
RDW: 14.3 % (ref 11.5–15.5)
Smear Review: NORMAL
WBC Count: 5.5 10*3/uL (ref 4.0–10.5)
nRBC: 0 % (ref 0.0–0.2)

## 2022-09-14 MED ORDER — SODIUM CHLORIDE 0.9 % IV SOLN: INTRAVENOUS | Status: AC

## 2022-09-14 MED ORDER — HEPARIN SOD (PORK) LOCK FLUSH 100 UNIT/ML IV SOLN
500.0000 [IU] | Freq: Once | INTRAVENOUS | Status: AC
  Administered 2022-09-14: 500 [IU]

## 2022-09-14 MED ORDER — SODIUM CHLORIDE 0.9% FLUSH
10.0000 mL | Freq: Once | INTRAVENOUS | Status: AC
  Administered 2022-09-14: 10 mL

## 2022-09-14 MED ORDER — DRONABINOL 2.5 MG PO CAPS: 2.5000 mg | ORAL_CAPSULE | Freq: Two times a day (BID) | ORAL | 0 refills | Status: DC

## 2022-09-14 MED ORDER — POTASSIUM CHLORIDE CRYS ER 20 MEQ PO TBCR
40.0000 meq | EXTENDED_RELEASE_TABLET | Freq: Once | ORAL | Status: AC
  Administered 2022-09-14: 40 meq via ORAL
  Filled 2022-09-14: qty 2

## 2022-09-14 NOTE — Progress Notes (Signed)
[facility] [phone]   [phone]  PROGRESS NOTE:  Patient Care Team: Angelica Chessman, MD as PCP - General (Family Medicine) Reece Packer, MD as Consulting Physician (Medical Oncology) Pickenpack-Cousar, Arty Baumgartner, NP as Nurse Practitioner (Nurse Practitioner)  CHIEF COMPLAINTS/PURPOSE OF CONSULTATION:  Metastatic pancreatic adenocarcinoma involving the liver  ONCOLOGIC HISTORY: Presented with nausea and right upper quadrant pain 03/08/2022: Abdominal US showed multiple hypoechoic masses within the liver are indeterminate.  03/10/2022: MR abdomen: Hypoenhancing mass compatible with pancreatic head adenocarcinoma with numerous targetoid enhancing masses in all segments of the liver, compatible with metastatic disease. The mass severely effaces and nearly occludes the portal vein and splenic vein, and occludes the superior mesenteric vein with some collaterals noted. The mass substantially abuts the proximal transverse duodenum, although overt duodenal invasion is indeterminate. 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 Prominent stool throughout the colon favors constipation. Fluid-fluid level in the gallbladder likely from sludge. 03/14/2022: Establish care with [facility] Hematology/Oncology 03/17/2022: CT chest: no definitive evidence of lung metastases.  03/22/2022: Liver biopsy confirmed metastatic adenocarcinoma, pancreatic primary.  03/30/2022: Cycle 1, Day 1 of mFOLFIRINOX 04/14/2022: Cycle 2, Day 1 of mFOLFIRINOX HELD due to ANC 300.  04/20/2022: Cycle 2, Day 1 of mFOLFIRINOX 05/04/2022: Cycle 3, Day 1 of  mFOLFIRINOX 05/19/2022: Cycle 4, Day 1 of mFOLFIRINOX HELD per patient request.  07/21/2022: Cycle 4, Day 1 of mFOLFIRINOX 08/04/2022: Cycle 5, Day 1 of mFOLFIRINOX HELD due to Plt 81K 08/17/2022: Cycle 5, Day 1 of mFOLFIRINOX 09/01/2022: Cycle 6, Day 1 of mFOLFIRINOX 09/14/2022: Cycle 7, Day 1 of mFOLFIRINOX HELD due to Plt 31K   HISTORY OF PRESENTING ILLNESS:  Kaitlin Ferrell presents today for a follow up. She was last seen on 08/17/2022. In the interim, she has resumed chemotherapy and presents today for Cycle 7, Day 1.   [name] reports reports energy levels are stable since last treatment. She does have fatigue but is able to do her baseline ADLs on her own. She reports decreased appetite and has lost 8 lbs since the last visit. She denies nausea, vomiting or bowel habit changes. She has noticed more bruising with some nodularity in her arms and legs. She denies any signs of active bleeding but has noticed gum bleeding after brushing her teeth. She denies fevers, chills, sweats, shortness of breath, chest pain or cough. She has no other complaints. Rest of the 10 point ROS is below.   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   treated with 4 cycles of actinomycin D from 12-02-11 thru 01-13-12   H/O molar pregnancy, antepartum    Hypertension    Migraine    Miscarriage     SURGICAL HISTORY: Past Surgical History:  Procedure Laterality Date   BREAST BIOPSY  2001   Left   BREAST LUMPECTOMY     DILATION AND CURETTAGE OF UTERUS  1992, 09/2011   DILATION AND EVACUATION  2011   INSERTION OF MESH N/A 09/03/2013  Procedure: INSERTION OF MESH;  Surgeon: Ardeth Sportsman, MD;  Location: [facility];  Service: General;  Laterality: N/A;   IR CHEST FLUORO  04/14/2022   IR IMAGING GUIDED PORT  INSERTION  03/22/2022   IR IMAGING GUIDED PORT INSERTION  05/02/2022   IR PORT REPAIR CENTRAL VENOUS ACCESS DEVICE  04/19/2022   IR REMOVAL TUN CV CATH W/O FL  05/02/2022   IR US LIVER BIOPSY  03/22/2022   PORTACATH PLACEMENT  11/2011   PORTACATH PLACEMENT     REMOVAL  MARCH 2014   VENTRAL HERNIA REPAIR N/A 09/03/2013   Procedure: LAPAROSCOPIC VENTRAL WALL HERNIA REPAIR;  Surgeon: Ardeth Sportsman, MD;  Location: [facility];  Service: General;  Laterality: N/A;   WISDOM TOOTH EXTRACTION     in 11th grade    SOCIAL HISTORY: Social History   Socioeconomic History   Marital status: Married    Spouse name: [name]   Number of children: 2   Years of education: college   Highest education level: Not on file  Occupational History    Comment: [employer]  Tobacco Use   Smoking status: Never   Smokeless tobacco: Never  Substance and Sexual Activity   Alcohol use: Yes    Alcohol/week: 1.0 standard drink of alcohol    Types: 1 Glasses of wine per week    Comment: occas   Drug use: No   Sexual activity: Not Currently    Birth control/protection: Pill  Other Topics Concern   Not on file  Social History Narrative   Patient is married [name]). Patient works part time at [employer].   Right handed.   Patient has her CMA.   Caffeine- None         Social Determinants of Health   Financial Resource Strain: Not on file  Food Insecurity: No Food Insecurity (05/10/2022)   Hunger Vital Sign    Worried About Running Out of Food in the Last Year: Never true    Ran Out of Food in the Last Year: Never true  Transportation Needs: No Transportation Needs (05/10/2022)   PRAPARE - Administrator, Civil Service (Medical): No    Lack of Transportation (Non-Medical): No  Physical Activity: Not on file  Stress: Not on file  Social Connections: Not on file  Intimate Partner Violence: Not At Risk (05/10/2022)   Humiliation, Afraid, Rape, and Kick questionnaire    Fear of Current or Ex-Partner: No     Emotionally Abused: No    Physically Abused: No    Sexually Abused: No    FAMILY HISTORY: Family History  Problem Relation Age of Onset   Breast cancer Mother        dx > 50   Heart attack Mother    Diabetes Father    Breast cancer Maternal Aunt        dx > 50   Breast cancer Maternal Aunt        dx > 50   Breast cancer Maternal Grandmother        ? < 50   Stroke Paternal Grandmother    Alcoholism Other     ALLERGIES:  is allergic to compazine [prochlorperazine edisylate], propofol, oxycodone-acetaminophen, prochlorperazine, and labetalol.  MEDICATIONS:  Current Outpatient Medications  Medication Sig Dispense Refill   dronabinol (MARINOL) 2.5 MG capsule Take 1 capsule (2.5 mg total) by mouth 2 (two) times daily before a meal. 60 capsule 0   amLODipine (NORVASC) 10 MG tablet Take  1 tablet by mouth daily.     apixaban (ELIQUIS) 5 MG TABS tablet Take 1 tablet (5 mg total) by mouth 2 (two) times daily. 60 tablet 2   Cholecalciferol 1.25 MG (50000 UT) capsule Take 50,000 Units by mouth once a week.     citalopram (CELEXA) 10 MG tablet Take by mouth. (Patient not taking: Reported on 06/15/2022)     Cyanocobalamin (VITAMIN B-12 CR PO) Take by mouth.     diphenoxylate-atropine (LOMOTIL) 2.5-0.025 MG tablet Take 1 tablet by mouth 4 (four) times daily as needed for diarrhea or loose stools. 30 tablet 0   ELDERBERRY PO Take by mouth.     eletriptan (RELPAX) 40 MG tablet Take 1 tablet (40 mg total) by mouth as needed for migraine or headache. May repeat in 2 hours if needed. 15 tablet 6   FIBER ADULT GUMMIES PO Take by mouth.     Folic Acid (FOLATE PO) Place 1 mg under the tongue daily.     hydrochlorothiazide (MICROZIDE) 12.5 MG capsule Take 12.5 mg by mouth daily as needed (swelling). (Patient not taking: Reported on 06/15/2022)     lidocaine-prilocaine (EMLA) cream Apply 1 Application topically as needed. 30 g 0   LORazepam (ATIVAN) 0.5 MG tablet Take 0.5 mg by mouth as needed for  anxiety.     magic mouthwash (multi-ingredient) oral suspension Swish and swallow 5 mLs by mouth 4 times daily as needed for throat discomfort 400 mL 1   magnesium oxide (MAG-OX) 400 (241.3 MG) MG tablet TAKE 1 TABLET BY MOUTH TWICE A DAY 60 tablet 6   morphine (MS CONTIN) 30 MG 12 hr tablet Take 1 tablet (30 mg total) by mouth every 8 (eight) hours. 90 tablet 0   morphine (MSIR) 15 MG tablet Take 1 tablet (15 mg total) by mouth every 6 (six) hours as needed for severe pain or moderate pain. 45 tablet 0   OLANZapine (ZYPREXA) 10 MG tablet TAKE 1 TABLET BY MOUTH EVERYDAY AT BEDTIME 90 tablet 1   ondansetron (ZOFRAN) 8 MG tablet Take 1 tablet (8 mg total) by mouth every 8 (eight) hours as needed for nausea or vomiting. 90 tablet 0   ondansetron (ZOFRAN-ODT) 8 MG disintegrating tablet Take 1 tablet (8 mg total) by mouth every 8 (eight) hours as needed for nausea or vomiting. 20 tablet 0   promethazine (PHENERGAN) 25 MG tablet Take 1 tablet (25 mg total) by mouth every 6 (six) hours as needed for nausea or vomiting. 30 tablet 0   scopolamine (TRANSDERM-SCOP) 1 MG/3DAYS Place 1 patch (1.5 mg total) onto the skin every 3 (three) days. 10 patch 12   No current facility-administered medications for this visit.   Facility-Administered Medications Ordered in Other Visits  Medication Dose Route Frequency Provider Last Rate Last Admin   0.9 %  sodium chloride infusion   Intravenous Continuous Briant Cedar, PA-C   Stopped at 09/14/22 1215    REVIEW OF SYSTEMS:   Constitutional: ( - ) fevers, ( - )  chills , ( - ) night sweats Eyes: ( - ) blurriness of vision, ( - ) double vision, ( - ) watery eyes Ears, nose, mouth, throat, and face: ( - ) mucositis, ( - ) sore throat Respiratory: ( - ) cough, ( - ) dyspnea, ( - ) wheezes Cardiovascular: ( - ) palpitation, ( - ) chest discomfort, ( - ) lower extremity swelling Gastrointestinal:  (+) nausea, ( - ) heartburn, ( -) change in bowel  habits Skin: ( - )  abnormal skin rashes Lymphatics: ( - ) new lymphadenopathy, ( - ) easy bruising Neurological: ( - ) numbness, ( - ) tingling, ( - ) new weaknesses Behavioral/Psych: ( - ) mood change, ( - ) new changes  All other systems were reviewed with the patient and are negative.  PHYSICAL EXAMINATION: ECOG PERFORMANCE STATUS: 1 - Symptomatic but completely ambulatory  Vitals:   09/14/22 1031  BP: 116/73  Pulse: 99  Resp: 16  Temp: 98.3 F (36.8 C)  SpO2: 100%     Filed Weights   09/14/22 1031  Weight: 123 lb 4.8 oz (55.9 kg)    GENERAL: well appearing female in NAD  SKIN: skin color, texture, turgor are normal, no rashes or significant lesions. Diffuse bruising with nodularity involving bilateral upper and lower extremities.  EYES: conjunctiva are pink and non-injected, sclera clear LUNGS: clear to auscultation and percussion with normal breathing effort HEART: regular rate & rhythm and no murmurs and no lower extremity edema Musculoskeletal: no cyanosis of digits and no clubbing  PSYCH: alert & oriented x 3, fluent speech NEURO: no focal motor/sensory deficits  LABORATORY DATA:  I have reviewed the data as listed    Latest Ref Rng & Units 09/14/2022   10:03 AM 09/01/2022    9:28 AM 08/24/2022   10:10 AM  CBC  WBC 4.0 - 10.5 K/uL 5.5  6.4  4.9   Hemoglobin 12.0 - 15.0 g/dL 9.2  9.6  44.0   Hematocrit 36.0 - 46.0 % 28.2  29.8  30.6   Platelets 150 - 400 K/uL 31  117  51        Latest Ref Rng & Units 09/14/2022   10:03 AM 09/01/2022    9:28 AM 08/24/2022   10:10 AM  CMP  Glucose 70 - 99 mg/dL 347  91  89   BUN 6 - 20 mg/dL 12  6  13    Creatinine 0.44 - 1.00 mg/dL 4.25  9.56  3.87   Sodium 135 - 145 mmol/L 140  141  138   Potassium 3.5 - 5.1 mmol/L 3.3  3.4  3.7   Chloride 98 - 111 mmol/L 103  106  105   CO2 22 - 32 mmol/L 29  29  28    Calcium 8.9 - 10.3 mg/dL 9.8  9.4  9.7   Total Protein 6.5 - 8.1 g/dL 7.8  6.5  7.3   Total Bilirubin 0.3 - 1.2 mg/dL 0.5  0.3  0.4    Alkaline Phos 38 - 126 U/L 227  303  423   AST 15 - 41 U/L 27  29  57   ALT 0 - 44 U/L 16  31  71      RADIOGRAPHIC STUDIES: I have personally reviewed the radiological images as listed and agreed with the findings in the report. No results found.  ASSESSMENT & PLAN Kaitlin Ferrell is a 57 y.o. female who returns to the clinic for newly diagnosed pancreatic adenocarcinoma.    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 Dr. Leonides Schanz recommends chemotherapy regimen with FOLFIRINOX q 2 weeks.  --Plan to obtain restaging CT scans every 3 months to assess treatment response.  --[facility] Testing: MS-stable, 2 Muts/Mb, ATMH657fs*31, KRASG12L, MDM2 amplification, CDKN2A/B. No targetable mutations.  --Started  mFOLFIRINOX on 03/30/2022 --After Cycle 3 on 05/04/2022, patient was on a treatment break due to poor tolerance and patient's request. --Most recent CT CAP from 06/30/2022 showed treatment response. --Patient resumed FOLFIRINOX without dose modification on 07/21/2022  PLAN: --Due for Cycle 7, Day 1 of FOLFIRINOX today --Labs from today reviewed with patient.  White blood cell 5.5, hemoglobin 9.2, MCV 87.0, and platelets of 31K. Creatinine normal.  --Will HOLD chemotherapy today due to thrombocytopenia. Strict precautions given for bleeding.  --Discussed with Dr. Leonides Schanz who will further dose reduce chemotherapy to prevent worsening thrombocytopenia in the future.  -- Return to clinic on Friday to check platelet counts and in 1 week for a toxicity check prior to Cycle 7 of FOLFIRINOX chemotherapy  Hypokalemia: --Potassium level is 3.3. 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  --Currently on Eliquis 5 mg BID  #Neutropenia --Added Udenyca on Day 3 with Cycle 2.  --Neutropenic precautions given to patient including monitoring for fevers.   #Elevated LFTs-normalized --ALT and ALT normalized (bumped on 08/17/2022), Alk phos 227 improved --Most likely secondary to chemotherapy --Monitor for now  #Nausea/Vomiting: --Secondary to chemotherapy and well controlled.  -- Continue to take scopolamine patch, phenergan 25 mg q 6 hours, zofran 8mg  q8H PRN for nausea, zyprexa nightly PRN for nausea.   #Epigastric/RUQ/back pain: --Likely secondary to underlying malignancy --Tramadol was ineffective.  --Current pain regimen includes MS contin to 30 mg q 12 hours and Norco 10-325 mg.   #Appetite loss/weight loss: --Lost 8 lbs since 09/01/2022 --Sent prescription for Marinol 2.5 mg BID.  --Encouraged to eat small, frequent meals and supplement with protein shakes --Follows with [facility] nutrition team  #Family history of breast cancer: --Due to strong family history of cancer and has undergone genetic testing in the past.  --Underwent genetic testing on 04/07/2022 that was negative. 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   #Supportive Care -- chemotherapy education to be scheduled  -- port placement complete  -- EMLA cream for port  Orders Placed This Encounter  Procedures   CBC with Differential (Cancer Center Only)    Standing Status:   Future    Standing Expiration Date:   09/21/2023   CMP (Cancer Center only)    Standing Status:   Future    Standing Expiration Date:   09/21/2023   CA 19.9    Standing Status:   Future    Standing Expiration Date:   09/21/2023   All questions were answered. The patient  knows to call the clinic with any problems, questions or concerns.  I have spent a total of 30 minutes minutes of face-to-face and non-face-to-face time, preparing to see the patient,  performing a medically appropriate examination, counseling and educating the patient, documenting clinical information in the electronic health record,  and care coordination.   Georga Kaufmann PA-C Dept of Hematology and Oncology [facility] Phone: [phone]

## 2022-09-14 NOTE — Telephone Encounter (Signed)
Initiated prior authorization request for Dronabinol 2.5 mg capsules by telephone with [facility]. Authorization request for additional information to be faxed to [phone].

## 2022-09-15 ENCOUNTER — Encounter: Payer: Self-pay | Admitting: Hematology and Oncology

## 2022-09-15 MED ORDER — DRONABINOL 2.5 MG PO CAPS: 2.5000 mg | ORAL_CAPSULE | Freq: Two times a day (BID) | ORAL | 0 refills | Status: DC

## 2022-09-16 ENCOUNTER — Other Ambulatory Visit: Payer: Self-pay

## 2022-09-16 ENCOUNTER — Inpatient Hospital Stay: Payer: BC Managed Care – PPO

## 2022-09-16 ENCOUNTER — Other Ambulatory Visit (HOSPITAL_COMMUNITY): Payer: Self-pay

## 2022-09-16 DIAGNOSIS — Z7901 Long term (current) use of anticoagulants: Secondary | ICD-10-CM | POA: Diagnosis not present

## 2022-09-16 DIAGNOSIS — E876 Hypokalemia: Secondary | ICD-10-CM | POA: Diagnosis not present

## 2022-09-16 DIAGNOSIS — G893 Neoplasm related pain (acute) (chronic): Secondary | ICD-10-CM | POA: Diagnosis not present

## 2022-09-16 DIAGNOSIS — Z515 Encounter for palliative care: Secondary | ICD-10-CM | POA: Diagnosis not present

## 2022-09-16 DIAGNOSIS — Z86718 Personal history of other venous thrombosis and embolism: Secondary | ICD-10-CM | POA: Diagnosis not present

## 2022-09-16 DIAGNOSIS — R112 Nausea with vomiting, unspecified: Secondary | ICD-10-CM | POA: Diagnosis not present

## 2022-09-16 DIAGNOSIS — Z452 Encounter for adjustment and management of vascular access device: Secondary | ICD-10-CM | POA: Diagnosis not present

## 2022-09-16 DIAGNOSIS — I81 Portal vein thrombosis: Secondary | ICD-10-CM | POA: Diagnosis not present

## 2022-09-16 DIAGNOSIS — C250 Malignant neoplasm of head of pancreas: Secondary | ICD-10-CM | POA: Diagnosis not present

## 2022-09-16 DIAGNOSIS — D696 Thrombocytopenia, unspecified: Secondary | ICD-10-CM | POA: Diagnosis not present

## 2022-09-16 DIAGNOSIS — R7989 Other specified abnormal findings of blood chemistry: Secondary | ICD-10-CM | POA: Diagnosis not present

## 2022-09-16 DIAGNOSIS — Z5111 Encounter for antineoplastic chemotherapy: Secondary | ICD-10-CM | POA: Diagnosis not present

## 2022-09-16 DIAGNOSIS — I10 Essential (primary) hypertension: Secondary | ICD-10-CM | POA: Diagnosis not present

## 2022-09-16 DIAGNOSIS — C787 Secondary malignant neoplasm of liver and intrahepatic bile duct: Secondary | ICD-10-CM | POA: Diagnosis not present

## 2022-09-16 DIAGNOSIS — C259 Malignant neoplasm of pancreas, unspecified: Secondary | ICD-10-CM

## 2022-09-16 LAB — CMP (CANCER CENTER ONLY)
ALT: 18 U/L (ref 0–44)
AST: 27 U/L (ref 15–41)
Albumin: 3.9 g/dL (ref 3.5–5.0)
Alkaline Phosphatase: 210 U/L — ABNORMAL HIGH (ref 38–126)
Anion gap: 8 (ref 5–15)
BUN: 9 mg/dL (ref 6–20)
CO2: 28 mmol/L (ref 22–32)
Calcium: 9.4 mg/dL (ref 8.9–10.3)
Chloride: 106 mmol/L (ref 98–111)
Creatinine: 0.64 mg/dL (ref 0.44–1.00)
GFR, Estimated: >60 mL/min (ref >60)
Glucose, Bld: 107 mg/dL — ABNORMAL HIGH (ref 70–99)
Potassium: 3.8 mmol/L (ref 3.5–5.1)
Sodium: 142 mmol/L (ref 135–145)
Total Bilirubin: 0.4 mg/dL (ref 0.3–1.2)
Total Protein: 7.1 g/dL (ref 6.5–8.1)

## 2022-09-16 LAB — CBC WITH DIFFERENTIAL (CANCER CENTER ONLY)
Abs Immature Granulocytes: 0.02 10*3/uL (ref 0.00–0.07)
Basophils Absolute: 0 10*3/uL (ref 0.0–0.1)
Basophils Relative: 1 %
Eosinophils Absolute: 0.1 10*3/uL (ref 0.0–0.5)
Eosinophils Relative: 1 %
HCT: 27.9 % — ABNORMAL LOW (ref 36.0–46.0)
Hemoglobin: 9.3 g/dL — ABNORMAL LOW (ref 12.0–15.0)
Immature Granulocytes: 0 %
Lymphocytes Relative: 28 %
Lymphs Abs: 1.5 10*3/uL (ref 0.7–4.0)
MCH: 29.4 pg (ref 26.0–34.0)
MCHC: 33.3 g/dL (ref 30.0–36.0)
MCV: 88.3 fL (ref 80.0–100.0)
Monocytes Absolute: 0.7 10*3/uL (ref 0.1–1.0)
Monocytes Relative: 13 %
Neutro Abs: 3 10*3/uL (ref 1.7–7.7)
Neutrophils Relative %: 57 %
Platelet Count: 40 10*3/uL — ABNORMAL LOW (ref 150–400)
RBC: 3.16 MIL/uL — ABNORMAL LOW (ref 3.87–5.11)
RDW: 14.9 % (ref 11.5–15.5)
Smear Review: NORMAL
WBC Count: 5.3 10*3/uL (ref 4.0–10.5)
nRBC: 0 % (ref 0.0–0.2)

## 2022-09-16 MED ORDER — DRONABINOL 2.5 MG PO CAPS
2.5000 mg | ORAL_CAPSULE | Freq: Two times a day (BID) | ORAL | 0 refills | Status: DC
  Filled 2022-09-16: qty 60, 30d supply, fill #0

## 2022-09-16 NOTE — Telephone Encounter (Signed)
Pt called because medication was out of stock at preferred pharmacy, see new order

## 2022-09-17 ENCOUNTER — Other Ambulatory Visit (HOSPITAL_COMMUNITY): Payer: Self-pay

## 2022-09-19 ENCOUNTER — Telehealth: Payer: Self-pay | Admitting: *Deleted

## 2022-09-19 ENCOUNTER — Other Ambulatory Visit: Payer: Self-pay

## 2022-09-19 DIAGNOSIS — C787 Secondary malignant neoplasm of liver and intrahepatic bile duct: Secondary | ICD-10-CM

## 2022-09-19 DIAGNOSIS — G893 Neoplasm related pain (acute) (chronic): Secondary | ICD-10-CM

## 2022-09-19 DIAGNOSIS — Z515 Encounter for palliative care: Secondary | ICD-10-CM

## 2022-09-19 MED ORDER — MORPHINE SULFATE ER 30 MG PO TBCR: 30.0000 mg | EXTENDED_RELEASE_TABLET | Freq: Three times a day (TID) | ORAL | 0 refills | Status: DC

## 2022-09-19 NOTE — Telephone Encounter (Signed)
Received call from pt asking about the tumor marker results. Reviewed that with her. CA 19-9  is down significantly. It went from 4569 to 850 in 1 month. Advised that this means her chemo is being effective, that this is what we want to see. Advised that her platelets were still low @ 40. Reminded her of bleeding precautions. Denies blood in urine, stool. No nosebleeds. Does  state she has a few bruises as she bumps into things when she is up and about. Reminded her that extra caution is needed while her platelets are low. She voiced understanding. She is aware of her appts this week.

## 2022-09-19 NOTE — Telephone Encounter (Signed)
Pt called for refill, see orders.

## 2022-09-21 ENCOUNTER — Inpatient Hospital Stay (HOSPITAL_BASED_OUTPATIENT_CLINIC_OR_DEPARTMENT_OTHER): Payer: BC Managed Care – PPO | Admitting: Physician Assistant

## 2022-09-21 ENCOUNTER — Other Ambulatory Visit: Payer: Self-pay

## 2022-09-21 ENCOUNTER — Inpatient Hospital Stay: Payer: BC Managed Care – PPO

## 2022-09-21 VITALS — BP 132/84 | HR 86 | Temp 98.8°F | Wt 127.1 lb

## 2022-09-21 DIAGNOSIS — C259 Malignant neoplasm of pancreas, unspecified: Secondary | ICD-10-CM

## 2022-09-21 DIAGNOSIS — R112 Nausea with vomiting, unspecified: Secondary | ICD-10-CM | POA: Diagnosis not present

## 2022-09-21 DIAGNOSIS — Z5111 Encounter for antineoplastic chemotherapy: Secondary | ICD-10-CM

## 2022-09-21 DIAGNOSIS — C250 Malignant neoplasm of head of pancreas: Secondary | ICD-10-CM | POA: Diagnosis not present

## 2022-09-21 DIAGNOSIS — R7989 Other specified abnormal findings of blood chemistry: Secondary | ICD-10-CM | POA: Diagnosis not present

## 2022-09-21 DIAGNOSIS — G893 Neoplasm related pain (acute) (chronic): Secondary | ICD-10-CM | POA: Diagnosis not present

## 2022-09-21 DIAGNOSIS — I81 Portal vein thrombosis: Secondary | ICD-10-CM | POA: Diagnosis not present

## 2022-09-21 DIAGNOSIS — Z95828 Presence of other vascular implants and grafts: Secondary | ICD-10-CM

## 2022-09-21 DIAGNOSIS — D696 Thrombocytopenia, unspecified: Secondary | ICD-10-CM | POA: Diagnosis not present

## 2022-09-21 DIAGNOSIS — Z452 Encounter for adjustment and management of vascular access device: Secondary | ICD-10-CM | POA: Diagnosis not present

## 2022-09-21 DIAGNOSIS — Z515 Encounter for palliative care: Secondary | ICD-10-CM | POA: Diagnosis not present

## 2022-09-21 DIAGNOSIS — I10 Essential (primary) hypertension: Secondary | ICD-10-CM | POA: Diagnosis not present

## 2022-09-21 DIAGNOSIS — E876 Hypokalemia: Secondary | ICD-10-CM | POA: Diagnosis not present

## 2022-09-21 DIAGNOSIS — C787 Secondary malignant neoplasm of liver and intrahepatic bile duct: Secondary | ICD-10-CM | POA: Diagnosis not present

## 2022-09-21 DIAGNOSIS — Z7901 Long term (current) use of anticoagulants: Secondary | ICD-10-CM | POA: Diagnosis not present

## 2022-09-21 DIAGNOSIS — Z86718 Personal history of other venous thrombosis and embolism: Secondary | ICD-10-CM | POA: Diagnosis not present

## 2022-09-21 LAB — CMP (CANCER CENTER ONLY)
ALT: 23 U/L (ref 0–44)
AST: 46 U/L — ABNORMAL HIGH (ref 15–41)
Albumin: 3.9 g/dL (ref 3.5–5.0)
Alkaline Phosphatase: 248 U/L — ABNORMAL HIGH (ref 38–126)
Anion gap: 5 (ref 5–15)
BUN: 7 mg/dL (ref 6–20)
CO2: 27 mmol/L (ref 22–32)
Calcium: 9 mg/dL (ref 8.9–10.3)
Chloride: 108 mmol/L (ref 98–111)
Creatinine: 0.39 mg/dL — ABNORMAL LOW (ref 0.44–1.00)
GFR, Estimated: >60 mL/min (ref >60)
Glucose, Bld: 100 mg/dL — ABNORMAL HIGH (ref 70–99)
Potassium: 4.3 mmol/L (ref 3.5–5.1)
Sodium: 140 mmol/L (ref 135–145)
Total Bilirubin: 0.4 mg/dL (ref 0.3–1.2)
Total Protein: 7.3 g/dL (ref 6.5–8.1)

## 2022-09-21 LAB — CBC WITH DIFFERENTIAL (CANCER CENTER ONLY)
Abs Immature Granulocytes: 0.02 10*3/uL (ref 0.00–0.07)
Basophils Absolute: 0 10*3/uL (ref 0.0–0.1)
Basophils Relative: 0 %
Eosinophils Absolute: 0.1 10*3/uL (ref 0.0–0.5)
Eosinophils Relative: 1 %
HCT: 28.4 % — ABNORMAL LOW (ref 36.0–46.0)
Hemoglobin: 9.1 g/dL — ABNORMAL LOW (ref 12.0–15.0)
Immature Granulocytes: 0 %
Lymphocytes Relative: 22 %
Lymphs Abs: 1.4 10*3/uL (ref 0.7–4.0)
MCH: 28.9 pg (ref 26.0–34.0)
MCHC: 32 g/dL (ref 30.0–36.0)
MCV: 90.2 fL (ref 80.0–100.0)
Monocytes Absolute: 0.8 10*3/uL (ref 0.1–1.0)
Monocytes Relative: 13 %
Neutro Abs: 4 10*3/uL (ref 1.7–7.7)
Neutrophils Relative %: 64 %
Platelet Count: 73 10*3/uL — ABNORMAL LOW (ref 150–400)
RBC: 3.15 MIL/uL — ABNORMAL LOW (ref 3.87–5.11)
RDW: 16.8 % — ABNORMAL HIGH (ref 11.5–15.5)
WBC Count: 6.2 10*3/uL (ref 4.0–10.5)
nRBC: 0 % (ref 0.0–0.2)

## 2022-09-21 MED ORDER — SODIUM CHLORIDE 0.9% FLUSH
10.0000 mL | Freq: Once | INTRAVENOUS | Status: AC
  Administered 2022-09-21: 10 mL

## 2022-09-21 MED ORDER — HEPARIN SOD (PORK) LOCK FLUSH 100 UNIT/ML IV SOLN
500.0000 [IU] | Freq: Once | INTRAVENOUS | Status: AC
  Administered 2022-09-21: 500 [IU]

## 2022-09-21 MED FILL — Fosaprepitant Dimeglumine For IV Infusion 150 MG (Base Eq): INTRAVENOUS | Qty: 5 | Status: AC

## 2022-09-21 NOTE — Progress Notes (Signed)
[facility] [phone]   [phone]  PROGRESS NOTE:  Patient Care Team: Kaitlin Chessman, MD as PCP - General (Family Medicine) Kaitlin Packer, MD as Consulting Physician (Medical Oncology) Pickenpack-Cousar, Kaitlin Baumgartner, NP as Nurse Practitioner (Nurse Practitioner)  CHIEF COMPLAINTS/PURPOSE OF CONSULTATION:  Metastatic pancreatic adenocarcinoma involving the liver  ONCOLOGIC HISTORY: Presented with nausea and right upper quadrant pain 03/08/2022: Abdominal US showed multiple hypoechoic masses within the liver are indeterminate.  03/10/2022: MR abdomen: Hypoenhancing mass compatible with pancreatic head adenocarcinoma with numerous targetoid enhancing masses in all segments of the liver, compatible with metastatic disease. The mass severely effaces and nearly occludes the portal vein and splenic vein, and occludes the superior mesenteric vein with some collaterals noted. The mass substantially abuts the proximal transverse duodenum, although overt duodenal invasion is indeterminate. 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 Prominent stool throughout the colon favors constipation. Fluid-fluid level in the gallbladder likely from sludge. 03/14/2022: Establish care with [facility]/Oncology 03/17/2022: CT chest: no definitive evidence of lung metastases.  03/22/2022: Liver biopsy confirmed metastatic adenocarcinoma, pancreatic primary.  03/30/2022: Cycle 1, Day 1 of mFOLFIRINOX 04/14/2022: Cycle 2, Day 1 of mFOLFIRINOX HELD due to ANC 300.  04/20/2022: Cycle 2, Day 1 of mFOLFIRINOX 05/04/2022: Cycle 3, Day 1 of  mFOLFIRINOX 05/19/2022: Cycle 4, Day 1 of mFOLFIRINOX HELD per patient request.  07/21/2022: Cycle 4, Day 1 of mFOLFIRINOX 08/04/2022: Cycle 5, Day 1 of mFOLFIRINOX HELD due to Plt 81K 08/17/2022: Cycle 5, Day 1 of mFOLFIRINOX 09/01/2022: Cycle 6, Day 1 of mFOLFIRINOX 09/14/2022: Cycle 7, Day 1 of mFOLFIRINOX HELD due to Plt 31K 09/22/2022: Cycle 7, Day 1 of mFOLFIRINOX HELD due to Plt 73K   HISTORY OF PRESENTING ILLNESS:  Kaitlin Ferrell presents today for a follow up. She was last seen on 09/14/2022 and chemo was held due to thrombocytopenia.  Patient presents today prior to Cycle 7, Day 1 scheduled for tomorrow.   [name] reports reports energy levels are slightly improved since last week. She is able to complete her ADLs independently. She reports her appetite is stable but weight improved since last week. She  denies nausea, vomiting or bowel habit changes. She denies any worsening bruising and previous bruising is still present with some nodularity in her arms and legs. She denies any signs of active bleeding. She denies fevers, chills, sweats, shortness of breath, chest pain or cough. She has no other complaints. Rest of the 10 point ROS is below.   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   treated with 4 cycles of actinomycin D from 12-02-11 thru 01-13-12   H/O molar pregnancy, antepartum    Hypertension    Migraine    Miscarriage     SURGICAL HISTORY: Past Surgical History:  Procedure Laterality Date   BREAST BIOPSY  2001   Left   BREAST LUMPECTOMY     DILATION AND CURETTAGE OF UTERUS  1992, 09/2011   DILATION AND EVACUATION  2011   INSERTION OF MESH N/A 09/03/2013   Procedure: INSERTION OF MESH;  Surgeon: Ardeth Sportsman, MD;  Location: [facility];  Service: General;   Laterality: N/A;   IR CHEST FLUORO  04/14/2022   IR IMAGING GUIDED PORT INSERTION  03/22/2022   IR IMAGING GUIDED PORT INSERTION  05/02/2022   IR PORT REPAIR CENTRAL VENOUS ACCESS DEVICE  04/19/2022   IR REMOVAL TUN CV CATH W/O FL  05/02/2022   IR US LIVER BIOPSY  03/22/2022   PORTACATH PLACEMENT  11/2011   PORTACATH PLACEMENT     REMOVAL  MARCH 2014   VENTRAL HERNIA REPAIR N/A 09/03/2013   Procedure: LAPAROSCOPIC VENTRAL WALL HERNIA REPAIR;  Surgeon: Ardeth Sportsman, MD;  Location: [facility];  Service: General;  Laterality: N/A;   WISDOM TOOTH EXTRACTION     in 11th grade    SOCIAL HISTORY: Social History   Socioeconomic History   Marital status: Married    Spouse name: Kaitlin Ferrell   Number of children: 2   Years of education: college   Highest education level: Not on file  Occupational History    Comment: Kaitlin Ferrell  Tobacco Use   Smoking status: Never   Smokeless tobacco: Never  Substance and Sexual Activity   Alcohol use: Yes    Alcohol/week: 1.0 standard drink of alcohol    Types: 1 Glasses of wine per week    Comment: occas   Drug use: No   Sexual activity: Not Currently    Birth control/protection: Pill  Other Topics Concern   Not on file  Social History Narrative   Patient is married Kaitlin Ferrell). Patient works part time at [employer].   Right handed.   Patient has her CMA.   Caffeine- None         Social Determinants of Health   Financial Resource Strain: Not on file  Food Insecurity: No Food Insecurity (05/10/2022)   Hunger Vital Sign    Worried About Running Out of Food in the Last Year: Never true    Ran Out of Food in the Last Year: Never true  Transportation Needs: No Transportation Needs (05/10/2022)   PRAPARE - Administrator, Civil Service (Medical): No    Lack of Transportation (Non-Medical): No  Physical Activity: Not on file  Stress: Not on file  Social Connections: Not on file  Intimate Partner Violence: Not At Risk (05/10/2022)   Humiliation,  Afraid, Rape, and Kick questionnaire    Fear of Current or Ex-Partner: No    Emotionally Abused: No    Physically Abused: No    Sexually Abused: No    FAMILY HISTORY: Family History  Problem Relation Age of Onset   Breast cancer Mother        dx > 50   Heart attack Mother    Diabetes Father    Breast cancer Maternal Aunt        dx > 50   Breast cancer Maternal Aunt        dx > 50   Breast cancer Maternal Grandmother        ? < 50   Stroke Paternal Grandmother    Alcoholism Other     ALLERGIES:  is allergic to compazine [prochlorperazine edisylate], propofol, oxycodone-acetaminophen, prochlorperazine, and labetalol.  MEDICATIONS:  Current Outpatient Medications  Medication Sig Dispense Refill   amLODipine (NORVASC) 10 MG tablet Take 1 tablet by mouth daily.     apixaban (ELIQUIS) 5 MG TABS tablet  Take 1 tablet (5 mg total) by mouth 2 (two) times daily. 60 tablet 2   Cholecalciferol 1.25 MG (50000 UT) capsule Take 50,000 Units by mouth once a week.     citalopram (CELEXA) 10 MG tablet Take by mouth. (Patient not taking: Reported on 06/15/2022)     Cyanocobalamin (VITAMIN B-12 CR PO) Take by mouth.     diphenoxylate-atropine (LOMOTIL) 2.5-0.025 MG tablet Take 1 tablet by mouth 4 (four) times daily as needed for diarrhea or loose stools. 30 tablet 0   dronabinol (MARINOL) 2.5 MG capsule Take 1 capsule (2.5 mg total) by mouth 2 (two) times daily before a meal. 60 capsule 0   ELDERBERRY PO Take by mouth.     eletriptan (RELPAX) 40 MG tablet Take 1 tablet (40 mg total) by mouth as needed for migraine or headache. May repeat in 2 hours if needed. 15 tablet 6   FIBER ADULT GUMMIES PO Take by mouth.     Folic Acid (FOLATE PO) Place 1 mg under the tongue daily.     hydrochlorothiazide (MICROZIDE) 12.5 MG capsule Take 12.5 mg by mouth daily as needed (swelling). (Patient not taking: Reported on 06/15/2022)     lidocaine-prilocaine (EMLA) cream Apply 1 Application topically as needed. 30 g 0    LORazepam (ATIVAN) 0.5 MG tablet Take 0.5 mg by mouth as needed for anxiety.     magic mouthwash (multi-ingredient) oral suspension Swish and swallow 5 mLs by mouth 4 times daily as needed for throat discomfort 400 mL 1   magnesium oxide (MAG-OX) 400 (241.3 MG) MG tablet TAKE 1 TABLET BY MOUTH TWICE A DAY 60 tablet 6   morphine (MS CONTIN) 30 MG 12 hr tablet Take 1 tablet (30 mg total) by mouth every 8 (eight) hours. 90 tablet 0   morphine (MSIR) 15 MG tablet Take 1 tablet (15 mg total) by mouth every 6 (six) hours as needed for severe pain or moderate pain. 45 tablet 0   OLANZapine (ZYPREXA) 10 MG tablet TAKE 1 TABLET BY MOUTH EVERYDAY AT BEDTIME 90 tablet 1   ondansetron (ZOFRAN) 8 MG tablet Take 1 tablet (8 mg total) by mouth every 8 (eight) hours as needed for nausea or vomiting. 90 tablet 0   ondansetron (ZOFRAN-ODT) 8 MG disintegrating tablet Take 1 tablet (8 mg total) by mouth every 8 (eight) hours as needed for nausea or vomiting. 20 tablet 0   promethazine (PHENERGAN) 25 MG tablet Take 1 tablet (25 mg total) by mouth every 6 (six) hours as needed for nausea or vomiting. 30 tablet 0   scopolamine (TRANSDERM-SCOP) 1 MG/3DAYS Place 1 patch (1.5 mg total) onto the skin every 3 (three) days. 10 patch 12   No current facility-administered medications for this visit.    REVIEW OF SYSTEMS:   Constitutional: ( - ) fevers, ( - )  chills , ( - ) night sweats Eyes: ( - ) blurriness of vision, ( - ) double vision, ( - ) watery eyes Ears, nose, mouth, throat, and face: ( - ) mucositis, ( - ) sore throat Respiratory: ( - ) cough, ( - ) dyspnea, ( - ) wheezes Cardiovascular: ( - ) palpitation, ( - ) chest discomfort, ( - ) lower extremity swelling Gastrointestinal:  (+) nausea, ( - ) heartburn, ( -) change in bowel habits Skin: ( - ) abnormal skin rashes Lymphatics: ( - ) new lymphadenopathy, ( + ) easy bruising Neurological: ( - ) numbness, ( - ) tingling, ( - )  new weaknesses Behavioral/Psych:  ( - ) mood change, ( - ) new changes  All other systems were reviewed with the patient and are negative.  PHYSICAL EXAMINATION: ECOG PERFORMANCE STATUS: 1 - Symptomatic but completely ambulatory  Vitals:   09/21/22 1201  BP: 132/84  Pulse: 86  Temp: 98.8 F (37.1 C)  SpO2: 100%     Filed Weights   09/21/22 1201  Weight: 127 lb 1.6 oz (57.7 kg)    GENERAL: well appearing female in NAD  SKIN: skin color, texture, turgor are normal, no rashes or significant lesions. Mild bruising with nodularity involving bilateral upper and lower extremities.  EYES: conjunctiva are pink and non-injected, sclera clear LUNGS: clear to auscultation and percussion with normal breathing effort HEART: regular rate & rhythm and no murmurs and no lower extremity edema Musculoskeletal: no cyanosis of digits and no clubbing  PSYCH: alert & oriented x 3, fluent speech NEURO: no focal motor/sensory deficits  LABORATORY DATA:  I have reviewed the data as listed    Latest Ref Rng & Units 09/21/2022   11:14 AM 09/16/2022    9:18 AM 09/14/2022   10:03 AM  CBC  WBC 4.0 - 10.5 K/uL 6.2  5.3  5.5   Hemoglobin 12.0 - 15.0 g/dL 9.1  9.3  9.2   Hematocrit 36.0 - 46.0 % 28.4  27.9  28.2   Platelets 150 - 400 K/uL 73  40  31        Latest Ref Rng & Units 09/21/2022   11:14 AM 09/16/2022    9:18 AM 09/14/2022   10:03 AM  CMP  Glucose 70 - 99 mg/dL 564  332  951   BUN 6 - 20 mg/dL 7  9  12    Creatinine 0.44 - 1.00 mg/dL 8.84  1.66  0.63   Sodium 135 - 145 mmol/L 140  142  140   Potassium 3.5 - 5.1 mmol/L 4.3  3.8  3.3   Chloride 98 - 111 mmol/L 108  106  103   CO2 22 - 32 mmol/L 27  28  29    Calcium 8.9 - 10.3 mg/dL 9.0  9.4  9.8   Total Protein 6.5 - 8.1 g/dL 7.3  7.1  7.8   Total Bilirubin 0.3 - 1.2 mg/dL 0.4  0.4  0.5   Alkaline Phos 38 - 126 U/L 248  210  227   AST 15 - 41 U/L 46  27  27   ALT 0 - 44 U/L 23  18  16       RADIOGRAPHIC STUDIES: I have personally reviewed the radiological images as  listed and agreed with the findings in the report. No results found.  ASSESSMENT & PLAN Kaitlin Ferrell is a 57 y.o. female who returns to the clinic for newly diagnosed pancreatic adenocarcinoma.    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 Dr. Leonides Schanz recommends chemotherapy regimen with FOLFIRINOX q 2 weeks.  --Plan to obtain restaging CT scans every 3 months to assess treatment response.  --[facility] Testing: MS-stable, 2 Muts/Mb, ATMH668fs*31, KRASG12L, MDM2 amplification, CDKN2A/B. No targetable mutations.  --Started mFOLFIRINOX on 03/30/2022 --After Cycle 3 on 05/04/2022, patient was on a treatment break due to poor tolerance and patient's request. --Most recent CT CAP from 06/30/2022 showed treatment response. --Patient resumed FOLFIRINOX without  dose modification on 07/21/2022  PLAN: --Due for Cycle 7, Day 1 of FOLFIRINOX tomorrow --Labs from today reviewed with patient.  White blood cell 6.2, hemoglobin 9.1, MCV 90.2, and platelets of 73K.  --Will HOLD chemotherapy tomorrow due to persistent thrombocytopenia. Strict precautions given for bleeding.  --Will plan to further dose reduce chemotherapy to prevent worsening thrombocytopenia in the future.  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  --Currently on Eliquis 5 mg BID  #Neutropenia --Added Udenyca on Day 3 with Cycle 2.  --Neutropenic precautions given to patient including  monitoring for fevers.   #Elevated LFTs --Most likely secondary to chemotherapy. AST 46, ALT 23, Alk Phos 248.  --Monitor for now  #Nausea/Vomiting: --Secondary to chemotherapy and well controlled.  -- Continue to take scopolamine patch, phenergan 25 mg q 6 hours, zofran 8mg  q8H PRN for nausea, zyprexa nightly PRN for nausea.   #Epigastric/RUQ/back pain: --Likely secondary to underlying malignancy --Tramadol was ineffective.  --Current pain regimen includes MS contin to 30 mg q 8 hours and Norco 10-325 mg.   #Appetite loss/weight loss: --Weight has improved since last week from 123 lbs to 127 lbs.  --Sent prescription for Marinol 2.5 mg BID, awaiting prior auth --Encouraged to eat small, frequent meals and supplement with protein shakes --Follows with [facility] nutrition team  #Family history of breast cancer: --Due to strong family history of cancer and has undergone genetic testing in the past.  --Underwent genetic testing on 04/07/2022 that was negative. 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   #Supportive Care -- chemotherapy education to be scheduled  -- port placement complete  -- EMLA cream for port  Orders Placed This Encounter  Procedures   CA 19.9    Standing Status:   Future    Standing Expiration Date:   09/28/2023   CBC with Differential (Cancer Center Only)    Standing Status:   Future    Standing Expiration Date:   09/28/2023   CMP (Cancer Center only)    Standing Status:   Future    Standing Expiration Date:   09/28/2023   All questions were answered. The patient knows to call the clinic with any problems, questions or concerns.  I have spent a total of 30 minutes minutes of face-to-face and non-face-to-face time, preparing to see the patient,  performing a medically appropriate examination, counseling and educating the patient, documenting clinical information in the electronic health record,  and care coordination.   Georga Kaufmann  PA-C Dept of Hematology and Oncology [facility] Phone: [phone]

## 2022-09-22 ENCOUNTER — Other Ambulatory Visit: Payer: Self-pay | Admitting: Physician Assistant

## 2022-09-22 ENCOUNTER — Telehealth: Payer: Self-pay

## 2022-09-22 ENCOUNTER — Encounter: Payer: BC Managed Care – PPO | Admitting: Nurse Practitioner

## 2022-09-22 ENCOUNTER — Inpatient Hospital Stay: Payer: BC Managed Care – PPO

## 2022-09-22 ENCOUNTER — Other Ambulatory Visit (HOSPITAL_COMMUNITY): Payer: Self-pay

## 2022-09-22 NOTE — Telephone Encounter (Signed)
Notified Patient of prior authorization approval for Marinol Capsules. Medication is approved until further notice; however medication has been on backorder and is unavailable at this time. Patient informed of this and Provider notified.Advised patient that Provider or Provider's Nurse would contact her. No other needs or concerns voiced at this time.

## 2022-09-23 ENCOUNTER — Other Ambulatory Visit: Payer: Self-pay | Admitting: Physician Assistant

## 2022-09-23 ENCOUNTER — Telehealth: Payer: Self-pay

## 2022-09-23 ENCOUNTER — Other Ambulatory Visit (HOSPITAL_COMMUNITY): Payer: Self-pay

## 2022-09-23 MED ORDER — MIRTAZAPINE 7.5 MG PO TABS: 7.5000 mg | ORAL_TABLET | Freq: Every day | ORAL | 0 refills | Status: DC

## 2022-09-23 NOTE — Telephone Encounter (Signed)
I called the pt informing her of Marinol being on back order per [employer]. She was given an alternative Remeron and she agreed, Pt verbalized understanding,  Rondel Jumbo. CMA

## 2022-09-24 ENCOUNTER — Inpatient Hospital Stay: Payer: BC Managed Care – PPO

## 2022-09-26 ENCOUNTER — Other Ambulatory Visit (HOSPITAL_COMMUNITY): Payer: Self-pay

## 2022-09-28 ENCOUNTER — Encounter: Payer: Self-pay | Admitting: Hematology and Oncology

## 2022-09-28 DIAGNOSIS — C259 Malignant neoplasm of pancreas, unspecified: Secondary | ICD-10-CM | POA: Diagnosis not present

## 2022-09-28 MED FILL — Fosaprepitant Dimeglumine For IV Infusion 150 MG (Base Eq): INTRAVENOUS | Qty: 5 | Status: AC

## 2022-09-28 MED FILL — Dexamethasone Sodium Phosphate Inj 100 MG/10ML: INTRAMUSCULAR | Qty: 1 | Status: AC

## 2022-09-28 NOTE — Progress Notes (Unsigned)
Palliative Medicine [facility]  Telephone:([phone]) [phone] Fax:(336) [phone]   Name: Kaitlin Ferrell Date: 09/28/2022 MRN: [id number]  DOB: October 29, 1965  Patient Care Team: Angelica Chessman, MD as PCP - General (Family Medicine) Reece Packer, MD as Consulting Physician (Medical Oncology) Pickenpack-Cousar, Arty Baumgartner, NP as Nurse Practitioner (Nurse Practitioner)    INTERVAL HISTORY: Kaitlin Ferrell is a 57 y.o. 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 Other history includes gestational trophoblastic disease, insomnia, HTN, and migraines. Palliative ask to see for symptom and pain management and goals of care.   SOCIAL HISTORY:     reports that she has never smoked. She has never used smokeless tobacco. She reports current alcohol use of about 1.0 standard drink of alcohol per week. She reports that she does not use drugs.  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  MEDICATIONS:  Current Outpatient Medications  Medication Sig Dispense Refill   amLODipine (NORVASC) 10 MG tablet Take 1 tablet by mouth daily.     apixaban (ELIQUIS) 5 MG TABS tablet Take 1 tablet (5 mg total) by mouth 2 (two) times daily. 60 tablet 2   Cholecalciferol 1.25 MG (50000 UT) capsule Take 50,000 Units by mouth once a week.     Cyanocobalamin  (VITAMIN B-12 CR PO) Take by mouth.     diphenoxylate-atropine (LOMOTIL) 2.5-0.025 MG tablet Take 1 tablet by mouth 4 (four) times daily as needed for diarrhea or loose stools. 30 tablet 0   dronabinol (MARINOL) 2.5 MG capsule Take 1 capsule (2.5 mg total) by mouth 2 (two) times daily before a meal. 60 capsule 0   ELDERBERRY PO Take by mouth.     eletriptan (RELPAX) 40 MG tablet Take 1 tablet (40 mg total) by mouth as needed for migraine or headache. May repeat in 2 hours if needed. 15 tablet 6   FIBER ADULT GUMMIES PO Take by mouth.     Folic Acid (FOLATE PO) Place 1 mg under the tongue daily.     hydrochlorothiazide (MICROZIDE) 12.5 MG capsule Take 12.5 mg by mouth daily as needed (swelling). (Patient not taking: Reported on 06/15/2022)     lidocaine-prilocaine (EMLA) cream Apply 1 Application topically as needed. 30 g 0   LORazepam (ATIVAN) 0.5 MG tablet Take 0.5 mg by mouth as needed for anxiety.     magic mouthwash (multi-ingredient) oral suspension Swish and swallow 5 mLs by mouth 4 times daily as needed for throat discomfort 400 mL 1   magnesium oxide (MAG-OX) 400 (241.3 MG) MG tablet TAKE 1 TABLET BY MOUTH TWICE A DAY 60 tablet 6   mirtazapine (REMERON) 7.5 MG tablet Take 1 tablet (7.5 mg total) by mouth at bedtime. 30 tablet 0   morphine (MS CONTIN) 30 MG 12 hr tablet Take 1 tablet (30 mg total) by mouth every 8 (eight) hours. 90 tablet 0   morphine (MSIR)  15 MG tablet Take 1 tablet (15 mg total) by mouth every 6 (six) hours as needed for severe pain or moderate pain. 45 tablet 0   OLANZapine (ZYPREXA) 10 MG tablet TAKE 1 TABLET BY MOUTH EVERYDAY AT BEDTIME 90 tablet 1   ondansetron (ZOFRAN) 8 MG tablet Take 1 tablet (8 mg total) by mouth every 8 (eight) hours as needed for nausea or vomiting. 90 tablet 0   ondansetron (ZOFRAN-ODT) 8 MG disintegrating tablet Take 1 tablet (8 mg total) by mouth every 8 (eight) hours as needed for nausea or vomiting. 20 tablet 0   promethazine (PHENERGAN) 25  MG tablet Take 1 tablet (25 mg total) by mouth every 6 (six) hours as needed for nausea or vomiting. 30 tablet 0   scopolamine (TRANSDERM-SCOP) 1 MG/3DAYS Place 1 patch (1.5 mg total) onto the skin every 3 (three) days. 10 patch 12   No current facility-administered medications for this visit.    VITAL SIGNS: LMP 06/26/2013 Comment: low dose BCP There were no vitals filed for this visit.  Estimated body mass index is 23.25 kg/m as calculated from the following:   Height as of 08/17/22: 5\' 2"  (1.575 m).   Weight as of 09/21/22: 127 lb 1.6 oz (57.7 kg).   PERFORMANCE STATUS (ECOG) : 1 - Symptomatic but completely ambulatory   Physical Exam General: NAD Cardiovascular: regular rate  Pulmonary: normal breathing pattern  Abdomen: soft, nontender, + bowel sounds Extremities: no edema, no joint deformities Skin: no rashes Neurological: AAO x4  IMPRESSION:  I saw Kaitlin Ferrell during her infusion. No acute distress. Doing well overall. Denies nausea, vomiting, constipation, or diarrhea. Appetite continues to be a challenge. Is trying to remain a challenge. Overall feeling well.   Neoplasm related pain Kaitlin Ferrell reports pain well controlled on current regimen.    We discussed her current regimen. She is taking MS Contin 30 mg every 8 hours and MS IR as needed. Not requiring breakthrough medication at this time. Taking medications as prescribed. Given pain is controlled no changes to regimen at this time. We will work with insurance for authorization to decrease cost of MS Contin.    We will continue to closely monitor and adjust as needed.    Decreased Appetite Kaitlin Ferrell continues to struggle with decreased appetite. She was started on mirtazapine a several days ago. Has not noticed a significant difference as of yet. No unwanted side effects. We discussed continuing with medication at bedtime with plans to increase dose in the next week if no improvement.   Current weight 125lbs down  from 127lbs on 8/14, 124lbs on 8/7, 131lbs on 7/25.  Will continue to closely monitor and support with Oncology team.   Goals of Care   07/07/22- We discussed her current illness and what it means in the larger context of her on-going co-morbidities. Natural disease trajectory and expectations were discussed.   Patient and husband are realistic in their understanding of her cancer diagnosis and trajectory. She shares they have also discussed at length with her children. Patient states she chose to pursue chemotherapy break due to significance of her symptoms. She was extremely fatigue, weak, could not get out of bed, no appetite, weight loss, nausea, vomiting, and diarrhea. [name] shares she was unable to perform ADLs and husband was having to bathe her. They were concerned that she was facing end-of-life given how poor her quality of life was. Patient and husband speaks to appreciation of her improvement in quality of life over  the past several weeks.    I created space and opportunity for patient and husband to discuss goals and hopes. Patient is trying to focus on healthy food options and be as active as she can. They speak to plans of watchful waiting to see what upcoming CT scan results show in terms of her cancer. She will then consider if she would like to pursue additional treatment options versus not. They are clear in their understanding what pros and cons are of pursing or not to pursue further treatment including end-of-life. Kaitlin Ferrell speaks to her quality of life is most important over quantity. She would not want to spend last moments in a stupor suffering state. Emotional support provided.   We discussed Her current illness and what it means in the larger context of Her on-going co-morbidities. Natural disease trajectory and expectations were discussed.  I discussed the importance of continued conversation with family and their medical providers regarding overall plan of care and  treatment options, ensuring decisions are within the context of the patients values and GOCs.  PLAN:  MS Contin 15mg  every 8 hours MS IR for breakthrough pain (not requiring daily) Senna-S for bowel regimen.  Mirtazapine 7.5 mg at bedtime I will plan to see patient back in 3-4 weeks in collaboration to other oncology appointments. Will plan for 1 week phone follow-up.   Patient expressed understanding and was in agreement with this plan. She also understands that She can call the clinic at any time with any questions, concerns, or complaints.   Any controlled substances utilized were prescribed in the context of palliative care. PDMP has been reviewed.    Visit consisted of counseling and education dealing with the complex and emotionally intense issues of symptom management and palliative care in the setting of serious and potentially life-threatening illness.Greater than 50%  of this time was spent counseling and coordinating care related to the above assessment and plan.  Willette Alma, AGPCNP-BC  Palliative Medicine Team/[facility]  *Please note that this is a verbal dictation therefore any spelling or grammatical errors are due to the "Dragon [employer] One" system interpretation.

## 2022-09-29 ENCOUNTER — Other Ambulatory Visit: Payer: BC Managed Care – PPO

## 2022-09-29 ENCOUNTER — Inpatient Hospital Stay: Payer: BC Managed Care – PPO

## 2022-09-29 ENCOUNTER — Ambulatory Visit: Payer: BC Managed Care – PPO | Admitting: Hematology and Oncology

## 2022-09-29 ENCOUNTER — Ambulatory Visit: Payer: BC Managed Care – PPO | Admitting: Dietician

## 2022-09-29 ENCOUNTER — Inpatient Hospital Stay (HOSPITAL_BASED_OUTPATIENT_CLINIC_OR_DEPARTMENT_OTHER): Payer: BC Managed Care – PPO | Admitting: Hematology and Oncology

## 2022-09-29 ENCOUNTER — Encounter: Payer: Self-pay | Admitting: Hematology and Oncology

## 2022-09-29 ENCOUNTER — Encounter: Payer: Self-pay | Admitting: Nurse Practitioner

## 2022-09-29 ENCOUNTER — Other Ambulatory Visit: Payer: Self-pay | Admitting: Hematology and Oncology

## 2022-09-29 ENCOUNTER — Encounter: Payer: BC Managed Care – PPO | Admitting: Dietician

## 2022-09-29 ENCOUNTER — Ambulatory Visit: Payer: BC Managed Care – PPO

## 2022-09-29 ENCOUNTER — Inpatient Hospital Stay (HOSPITAL_BASED_OUTPATIENT_CLINIC_OR_DEPARTMENT_OTHER): Payer: BC Managed Care – PPO | Admitting: Nurse Practitioner

## 2022-09-29 VITALS — BP 104/66 | HR 81 | Temp 98.2°F | Resp 18 | Wt 125.8 lb

## 2022-09-29 DIAGNOSIS — Z5111 Encounter for antineoplastic chemotherapy: Secondary | ICD-10-CM | POA: Diagnosis not present

## 2022-09-29 DIAGNOSIS — I10 Essential (primary) hypertension: Secondary | ICD-10-CM | POA: Diagnosis not present

## 2022-09-29 DIAGNOSIS — C259 Malignant neoplasm of pancreas, unspecified: Secondary | ICD-10-CM

## 2022-09-29 DIAGNOSIS — R7989 Other specified abnormal findings of blood chemistry: Secondary | ICD-10-CM | POA: Diagnosis not present

## 2022-09-29 DIAGNOSIS — G893 Neoplasm related pain (acute) (chronic): Secondary | ICD-10-CM

## 2022-09-29 DIAGNOSIS — Z7901 Long term (current) use of anticoagulants: Secondary | ICD-10-CM | POA: Diagnosis not present

## 2022-09-29 DIAGNOSIS — Z515 Encounter for palliative care: Secondary | ICD-10-CM

## 2022-09-29 DIAGNOSIS — E876 Hypokalemia: Secondary | ICD-10-CM | POA: Diagnosis not present

## 2022-09-29 DIAGNOSIS — C787 Secondary malignant neoplasm of liver and intrahepatic bile duct: Secondary | ICD-10-CM

## 2022-09-29 DIAGNOSIS — Z95828 Presence of other vascular implants and grafts: Secondary | ICD-10-CM

## 2022-09-29 DIAGNOSIS — R630 Anorexia: Secondary | ICD-10-CM

## 2022-09-29 DIAGNOSIS — Z452 Encounter for adjustment and management of vascular access device: Secondary | ICD-10-CM | POA: Diagnosis not present

## 2022-09-29 DIAGNOSIS — C250 Malignant neoplasm of head of pancreas: Secondary | ICD-10-CM | POA: Diagnosis not present

## 2022-09-29 DIAGNOSIS — R634 Abnormal weight loss: Secondary | ICD-10-CM

## 2022-09-29 DIAGNOSIS — D696 Thrombocytopenia, unspecified: Secondary | ICD-10-CM | POA: Diagnosis not present

## 2022-09-29 DIAGNOSIS — I81 Portal vein thrombosis: Secondary | ICD-10-CM | POA: Diagnosis not present

## 2022-09-29 DIAGNOSIS — Z86718 Personal history of other venous thrombosis and embolism: Secondary | ICD-10-CM | POA: Diagnosis not present

## 2022-09-29 DIAGNOSIS — R112 Nausea with vomiting, unspecified: Secondary | ICD-10-CM | POA: Diagnosis not present

## 2022-09-29 LAB — CMP (CANCER CENTER ONLY)
ALT: 35 U/L (ref 0–44)
AST: 67 U/L — ABNORMAL HIGH (ref 15–41)
Albumin: 3.6 g/dL (ref 3.5–5.0)
Alkaline Phosphatase: 199 U/L — ABNORMAL HIGH (ref 38–126)
Anion gap: 7 (ref 5–15)
BUN: 15 mg/dL (ref 6–20)
CO2: 25 mmol/L (ref 22–32)
Calcium: 9.1 mg/dL (ref 8.9–10.3)
Chloride: 106 mmol/L (ref 98–111)
Creatinine: 0.69 mg/dL (ref 0.44–1.00)
GFR, Estimated: >60 mL/min (ref >60)
Glucose, Bld: 147 mg/dL — ABNORMAL HIGH (ref 70–99)
Potassium: 3.5 mmol/L (ref 3.5–5.1)
Sodium: 138 mmol/L (ref 135–145)
Total Bilirubin: 0.4 mg/dL (ref 0.3–1.2)
Total Protein: 7.2 g/dL (ref 6.5–8.1)

## 2022-09-29 LAB — CBC WITH DIFFERENTIAL (CANCER CENTER ONLY)
Abs Immature Granulocytes: 0.01 10*3/uL (ref 0.00–0.07)
Basophils Absolute: 0 10*3/uL (ref 0.0–0.1)
Basophils Relative: 0 %
Eosinophils Absolute: 0.1 10*3/uL (ref 0.0–0.5)
Eosinophils Relative: 2 %
HCT: 30.1 % — ABNORMAL LOW (ref 36.0–46.0)
Hemoglobin: 9.6 g/dL — ABNORMAL LOW (ref 12.0–15.0)
Immature Granulocytes: 0 %
Lymphocytes Relative: 35 %
Lymphs Abs: 1.7 10*3/uL (ref 0.7–4.0)
MCH: 29.4 pg (ref 26.0–34.0)
MCHC: 31.9 g/dL (ref 30.0–36.0)
MCV: 92.3 fL (ref 80.0–100.0)
Monocytes Absolute: 0.8 10*3/uL (ref 0.1–1.0)
Monocytes Relative: 17 %
Neutro Abs: 2.2 10*3/uL (ref 1.7–7.7)
Neutrophils Relative %: 46 %
Platelet Count: 147 10*3/uL — ABNORMAL LOW (ref 150–400)
RBC: 3.26 MIL/uL — ABNORMAL LOW (ref 3.87–5.11)
RDW: 18.5 % — ABNORMAL HIGH (ref 11.5–15.5)
WBC Count: 4.8 10*3/uL (ref 4.0–10.5)
nRBC: 0 % (ref 0.0–0.2)

## 2022-09-29 MED ORDER — SODIUM CHLORIDE 0.9% FLUSH: 10.0000 mL | INTRAVENOUS | Status: DC | PRN

## 2022-09-29 MED ORDER — SODIUM CHLORIDE 0.9 % IV SOLN
150.0000 mg | Freq: Once | INTRAVENOUS | Status: AC
  Administered 2022-09-29: 150 mg via INTRAVENOUS
  Filled 2022-09-29: qty 5
  Filled 2022-09-29: qty 150
  Filled 2022-09-29: qty 5

## 2022-09-29 MED ORDER — SODIUM CHLORIDE 0.9 % IV SOLN
10.0000 mg | Freq: Once | INTRAVENOUS | Status: AC
  Administered 2022-09-29: 10 mg via INTRAVENOUS
  Filled 2022-09-29 (×2): qty 1
  Filled 2022-09-29: qty 10

## 2022-09-29 MED ORDER — PALONOSETRON HCL INJECTION 0.25 MG/5ML
0.2500 mg | Freq: Once | INTRAVENOUS | Status: AC
  Administered 2022-09-29: 0.25 mg via INTRAVENOUS
  Filled 2022-09-29: qty 5

## 2022-09-29 MED ORDER — DEXTROSE 5 % IV SOLN: Freq: Once | INTRAVENOUS | Status: AC

## 2022-09-29 MED ORDER — ATROPINE SULFATE 1 MG/ML IV SOLN
0.5000 mg | Freq: Once | INTRAVENOUS | Status: AC | PRN
  Administered 2022-09-29: 0.5 mg via INTRAVENOUS
  Filled 2022-09-29: qty 1

## 2022-09-29 MED ORDER — SODIUM CHLORIDE 0.9% FLUSH
10.0000 mL | Freq: Once | INTRAVENOUS | Status: AC
  Administered 2022-09-29: 10 mL

## 2022-09-29 MED ORDER — OXALIPLATIN CHEMO INJECTION 100 MG/20ML
100.0000 mg | Freq: Once | INTRAVENOUS | Status: AC
  Administered 2022-09-29: 100 mg via INTRAVENOUS
  Filled 2022-09-29: qty 20

## 2022-09-29 MED ORDER — LIDOCAINE-PRILOCAINE 2.5-2.5 % EX CREA: 1.0000 | TOPICAL_CREAM | CUTANEOUS | 2 refills | Status: DC | PRN

## 2022-09-29 MED ORDER — SODIUM CHLORIDE 0.9 % IV SOLN
632.0000 mg | Freq: Once | INTRAVENOUS | Status: AC
  Administered 2022-09-29: 632 mg via INTRAVENOUS
  Filled 2022-09-29: qty 31.6

## 2022-09-29 MED ORDER — SODIUM CHLORIDE 0.9 % IV SOLN
3800.0000 mg | INTRAVENOUS | Status: DC
  Administered 2022-09-29: 3800 mg via INTRAVENOUS
  Filled 2022-09-29: qty 76

## 2022-09-29 MED ORDER — HEPARIN SOD (PORK) LOCK FLUSH 100 UNIT/ML IV SOLN: 500.0000 [IU] | Freq: Once | INTRAVENOUS | Status: DC | PRN

## 2022-09-29 MED ORDER — SODIUM CHLORIDE 0.9 % IV SOLN
100.0000 mg/m2 | Freq: Once | INTRAVENOUS | Status: AC
  Administered 2022-09-29: 160 mg via INTRAVENOUS
  Filled 2022-09-29: qty 8

## 2022-09-29 NOTE — Progress Notes (Signed)
Nutrition Follow-up:  Patient with pancreatic carcinoma metastatic to liver. She is receiving modified Folfirinox q14d (start 2/20)   Met with patient and husband in infusion. Pt reports appetite has been good. She is not eating "a lot" at one time, but eating often. Pt drinking 2 vanilla CIB with skim milk/day. She reports altered taste. Pt made BBQ meatballs, cabbage, black-eyed peas. This was nasty. Ate a few bites. Pt had a caramel frappe this morning which tasted great. Sweet foods are good, but limiting intake after reading sugar feeds cancer. Nausea is well controlled with antiemetic regimen. She reports constipation. Pt working to increase intake of water. 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     Medications: reviewed   Labs: reviewed   Anthropometrics: Wt 125 lb 12.8 oz today   8/14 - 127 lb 1.6 oz 8/7 - 123 lb 4.8 oz    NUTRITION DIAGNOSIS: Unintended wt loss - improving    INTERVENTION:  Continue strategies for increasing calories and protein with small frequent meals/snacks Encouraged high calorie high protein foods to promote wt gain Continue CIB 2x/day - suggested switching to fairlife milk for added protein Educated on sugar and cancer    MONITORING, EVALUATION, GOAL: weight trends, intake   NEXT VISIT: Wednesday September 4 during infusion

## 2022-09-29 NOTE — Patient Instructions (Signed)
[facility]  Discharge Instructions: Thank you for choosing [facility] to provide your oncology and hematology care.   If you have a lab appointment with the Cancer Center, please go directly to the Cancer Center and check in at the registration area.   Wear comfortable clothing and clothing appropriate for easy access to any Portacath or PICC line.   We strive to give you quality time with your provider. You may need to reschedule your appointment if you arrive late (15 or more minutes).  Arriving late affects you and other patients whose appointments are after yours.  Also, if you miss three or more appointments without notifying the office, you may be dismissed from the clinic at the provider's discretion.      For prescription refill requests, have your pharmacy contact our office and allow 72 hours for refills to be completed.    Today you received the following chemotherapy and/or immunotherapy agents: Oxaliplatin, Leucovorin, Irinotecan, Fluorouracil.       To help prevent nausea and vomiting after your treatment, we encourage you to take your nausea medication as directed.  BELOW ARE SYMPTOMS THAT SHOULD BE REPORTED IMMEDIATELY: *FEVER GREATER THAN 100.4 F (38 C) OR HIGHER *CHILLS OR SWEATING *NAUSEA AND VOMITING THAT IS NOT CONTROLLED WITH YOUR NAUSEA MEDICATION *UNUSUAL SHORTNESS OF BREATH *UNUSUAL BRUISING OR BLEEDING *URINARY PROBLEMS (pain or burning when urinating, or frequent urination) *BOWEL PROBLEMS (unusual diarrhea, constipation, pain near the anus) TENDERNESS IN MOUTH AND THROAT WITH OR WITHOUT PRESENCE OF ULCERS (sore throat, sores in mouth, or a toothache) UNUSUAL RASH, SWELLING OR PAIN  UNUSUAL VAGINAL DISCHARGE OR ITCHING   Items with * indicate a potential emergency and should be followed up as soon as possible or go to the Emergency Department if any problems should occur.  Please show the CHEMOTHERAPY ALERT  CARD or IMMUNOTHERAPY ALERT CARD at check-in to the Emergency Department and triage nurse.  Should you have questions after your visit or need to cancel or reschedule your appointment, please contact [facility]  Dept: [phone]  and follow the prompts.  Office hours are 8:00 a.m. to 4:30 p.m. Monday - Friday. Please note that voicemails left after 4:00 p.m. may not be returned until the following business day.  We are closed weekends and major holidays. You have access to a nurse at all times for urgent questions. Please call the main number to the clinic Dept: [phone] and follow the prompts.   For any non-urgent questions, you may also contact your provider using MyChart. We now offer e-Visits for anyone 29 and older to request care online for non-urgent symptoms. For details visit [email or web address].   Also download the MyChart app! Go to the app store, search "MyChart", open the app, select , and log in with your MyChart username and password.

## 2022-09-29 NOTE — Progress Notes (Signed)
Patient with weight loss since treatment plan first ordered. Per Dr. Leonides Schanz, OK to adjust treatment plan to reflect new BSA.   Jerry Caras, PharmD PGY2 Oncology Pharmacy Resident   09/29/2022 10:44 AM

## 2022-09-29 NOTE — Progress Notes (Signed)
[facility] [phone]   [phone]  PROGRESS NOTE:  Patient Care Team: Angelica Chessman, MD as PCP - General (Family Medicine) Reece Packer, MD as Consulting Physician (Medical Oncology) Pickenpack-Cousar, Arty Baumgartner, NP as Nurse Practitioner (Nurse Practitioner)  CHIEF COMPLAINTS/PURPOSE OF CONSULTATION:  Metastatic pancreatic adenocarcinoma involving the liver  ONCOLOGIC HISTORY: Presented with nausea and right upper quadrant pain 03/08/2022: Abdominal US showed multiple hypoechoic masses within the liver are indeterminate.  03/10/2022: MR abdomen: Hypoenhancing mass compatible with pancreatic head adenocarcinoma with numerous targetoid enhancing masses in all segments of the liver, compatible with metastatic disease. The mass severely effaces and nearly occludes the portal vein and splenic vein, and occludes the superior mesenteric vein with some collaterals noted. The mass substantially abuts the proximal transverse duodenum, although overt duodenal invasion is indeterminate. 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 Prominent stool throughout the colon favors constipation. Fluid-fluid level in the gallbladder likely from sludge. 03/14/2022: Establish care with [facility] Hematology/Oncology 03/17/2022: CT chest: no definitive evidence of lung metastases.  03/22/2022: Liver biopsy confirmed metastatic adenocarcinoma, pancreatic primary.  03/30/2022: Cycle 1, Day 1 of mFOLFIRINOX 04/14/2022: Cycle 2, Day 1 of mFOLFIRINOX HELD due to ANC 300.  04/20/2022: Cycle 2, Day 1 of mFOLFIRINOX 05/04/2022: Cycle 3, Day 1 of  mFOLFIRINOX 05/19/2022: Cycle 4, Day 1 of mFOLFIRINOX HELD per patient request.  07/21/2022: Cycle 4, Day 1 of mFOLFIRINOX 08/04/2022: Cycle 5, Day 1 of mFOLFIRINOX HELD due to Plt 81K 08/17/2022: Cycle 5, Day 1 of mFOLFIRINOX 09/01/2022: Cycle 6, Day 1 of mFOLFIRINOX 09/14/2022: Cycle 7, Day 1 of mFOLFIRINOX HELD due to Plt 31K 09/22/2022: Cycle 7, Day 1 of mFOLFIRINOX HELD due to Plt 73K   HISTORY OF PRESENTING ILLNESS:  Kaitlin Ferrell presents today for a follow up. She was last seen on 09/21/2022 and chemo was held due to thrombocytopenia.  Patient presents today prior to Cycle 7, Day 1.  Kaitlin Ferrell reports her last cycle went okay.  Her major symptom was fatigue and she also felt like she had knots in her legs.  She reports that there is a small lesions under her skin to multiple places in the body that are rubbery.  She notes that it tends to occur more when her platelets are low.  She reports that she is having some occasional gum bleeding but no blood in the urine or stool.  She reports her appetite is improving though her weight has dropped 2 pounds since her last visit.  She reports that she is currently trying to find other ways to boost her appetite.  She has been having some trouble with constipation but otherwise tolerated her last cycle of chemotherapy without any difficulty.  Overall she is willing and able to proceed with treatment at this time.. She denies any signs of active bleeding. She denies fevers, chills, sweats, shortness of breath, chest pain or cough. She has no other complaints. Rest of the 10 point ROS is below.   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   treated with 4 cycles of actinomycin D from 12-02-11 thru 01-13-12   H/O molar pregnancy, antepartum     Hypertension    Migraine    Miscarriage     SURGICAL HISTORY: Past Surgical History:  Procedure Laterality Date   BREAST BIOPSY  2001   Left   BREAST LUMPECTOMY     DILATION AND CURETTAGE OF UTERUS  1992, 09/2011   DILATION AND EVACUATION  2011   INSERTION OF MESH N/A 09/03/2013   Procedure: INSERTION OF MESH;  Surgeon: Ardeth Sportsman, MD;  Location: [facility];  Service: General;  Laterality: N/A;   IR CHEST FLUORO  04/14/2022   IR IMAGING GUIDED PORT INSERTION  03/22/2022   IR IMAGING GUIDED PORT INSERTION  05/02/2022   IR PORT REPAIR CENTRAL VENOUS ACCESS DEVICE  04/19/2022   IR REMOVAL TUN CV CATH W/O FL  05/02/2022   IR US LIVER BIOPSY  03/22/2022   PORTACATH PLACEMENT  11/2011   PORTACATH PLACEMENT     REMOVAL  MARCH 2014   VENTRAL HERNIA REPAIR N/A 09/03/2013   Procedure: LAPAROSCOPIC VENTRAL WALL HERNIA REPAIR;  Surgeon: Ardeth Sportsman, MD;  Location: [facility];  Service: General;  Laterality: N/A;   WISDOM TOOTH EXTRACTION     in 11th grade    SOCIAL HISTORY: Social History   Socioeconomic History   Marital status: Married    Spouse name: [name]   Number of children: 2   Years of education: college   Highest education level: Not on file  Occupational History    Comment: [employer]  Tobacco Use   Smoking status: Never   Smokeless tobacco: Never  Substance and Sexual Activity   Alcohol use: Yes    Alcohol/week: 1.0 standard drink of alcohol    Types: 1 Glasses of wine per week    Comment: occas   Drug use: No   Sexual activity: Not Currently    Birth control/protection: Pill  Other Topics Concern   Not on file  Social History Narrative   Patient is married [name]). Patient works part time at [employer].   Right handed.   Patient has her CMA.   Caffeine- None         Social Determinants of Health   Financial Resource Strain: Not on file  Food Insecurity: No Food Insecurity (05/10/2022)   Hunger Vital Sign    Worried About Running Out of Food in the Last Year:  Never true    Ran Out of Food in the Last Year: Never true  Transportation Needs: No Transportation Needs (05/10/2022)   PRAPARE - Administrator, Civil Service (Medical): No    Lack of Transportation (Non-Medical): No  Physical Activity: Not on file  Stress: Not on file  Social Connections: Not on file  Intimate Partner Violence: Not At Risk (05/10/2022)   Humiliation, Afraid, Rape, and Kick questionnaire    Fear of Current or Ex-Partner: No    Emotionally Abused: No    Physically Abused: No    Sexually Abused: No    FAMILY HISTORY: Family History  Problem Relation Age of Onset   Breast cancer Mother        dx > 50   Heart attack Mother    Diabetes Father    Breast cancer Maternal Aunt        dx > 50   Breast cancer Maternal Aunt  dx > 50   Breast cancer Maternal Grandmother        ? < 50   Stroke Paternal Grandmother    Alcoholism Other     ALLERGIES:  is allergic to compazine [prochlorperazine edisylate], propofol, oxycodone-acetaminophen, prochlorperazine, and labetalol.  MEDICATIONS:  Current Outpatient Medications  Medication Sig Dispense Refill   amLODipine (NORVASC) 10 MG tablet Take 1 tablet by mouth daily.     apixaban (ELIQUIS) 5 MG TABS tablet Take 1 tablet (5 mg total) by mouth 2 (two) times daily. 60 tablet 2   Cholecalciferol 1.25 MG (50000 UT) capsule Take 50,000 Units by mouth once a week.     Cyanocobalamin (VITAMIN B-12 CR PO) Take by mouth.     diphenoxylate-atropine (LOMOTIL) 2.5-0.025 MG tablet Take 1 tablet by mouth 4 (four) times daily as needed for diarrhea or loose stools. 30 tablet 0   dronabinol (MARINOL) 2.5 MG capsule Take 1 capsule (2.5 mg total) by mouth 2 (two) times daily before a meal. 60 capsule 0   ELDERBERRY PO Take by mouth.     eletriptan (RELPAX) 40 MG tablet Take 1 tablet (40 mg total) by mouth as needed for migraine or headache. May repeat in 2 hours if needed. 15 tablet 6   FIBER ADULT GUMMIES PO Take by mouth.      Folic Acid (FOLATE PO) Place 1 mg under the tongue daily.     hydrochlorothiazide (MICROZIDE) 12.5 MG capsule Take 12.5 mg by mouth daily as needed (swelling). (Patient not taking: Reported on 06/15/2022)     lidocaine-prilocaine (EMLA) cream Apply 1 Application topically as needed. 30 g 2   LORazepam (ATIVAN) 0.5 MG tablet Take 0.5 mg by mouth as needed for anxiety.     magic mouthwash (multi-ingredient) oral suspension Swish and swallow 5 mLs by mouth 4 times daily as needed for throat discomfort 400 mL 1   magnesium oxide (MAG-OX) 400 (241.3 MG) MG tablet TAKE 1 TABLET BY MOUTH TWICE A DAY 60 tablet 6   mirtazapine (REMERON) 7.5 MG tablet Take 1 tablet (7.5 mg total) by mouth at bedtime. 30 tablet 0   morphine (MS CONTIN) 30 MG 12 hr tablet Take 1 tablet (30 mg total) by mouth every 8 (eight) hours. 90 tablet 0   morphine (MSIR) 15 MG tablet Take 1 tablet (15 mg total) by mouth every 6 (six) hours as needed for severe pain or moderate pain. 45 tablet 0   OLANZapine (ZYPREXA) 10 MG tablet TAKE 1 TABLET BY MOUTH EVERYDAY AT BEDTIME 90 tablet 1   ondansetron (ZOFRAN) 8 MG tablet Take 1 tablet (8 mg total) by mouth every 8 (eight) hours as needed for nausea or vomiting. 90 tablet 0   ondansetron (ZOFRAN-ODT) 8 MG disintegrating tablet Take 1 tablet (8 mg total) by mouth every 8 (eight) hours as needed for nausea or vomiting. 20 tablet 0   promethazine (PHENERGAN) 25 MG tablet Take 1 tablet (25 mg total) by mouth every 6 (six) hours as needed for nausea or vomiting. 30 tablet 0   scopolamine (TRANSDERM-SCOP) 1 MG/3DAYS Place 1 patch (1.5 mg total) onto the skin every 3 (three) days. 10 patch 12   No current facility-administered medications for this visit.    REVIEW OF SYSTEMS:   Constitutional: ( - ) fevers, ( - )  chills , ( - ) night sweats Eyes: ( - ) blurriness of vision, ( - ) double vision, ( - ) watery eyes Ears, nose, mouth, throat, and  face: ( - ) mucositis, ( - ) sore  throat Respiratory: ( - ) cough, ( - ) dyspnea, ( - ) wheezes Cardiovascular: ( - ) palpitation, ( - ) chest discomfort, ( - ) lower extremity swelling Gastrointestinal:  (+) nausea, ( - ) heartburn, ( -) change in bowel habits Skin: ( - ) abnormal skin rashes Lymphatics: ( - ) new lymphadenopathy, ( + ) easy bruising Neurological: ( - ) numbness, ( - ) tingling, ( - ) new weaknesses Behavioral/Psych: ( - ) mood change, ( - ) new changes  All other systems were reviewed with the patient and are negative.  PHYSICAL EXAMINATION: ECOG PERFORMANCE STATUS: 1 - Symptomatic but completely ambulatory  There were no vitals filed for this visit.    There were no vitals filed for this visit.   GENERAL: well appearing female in NAD  SKIN: skin color, texture, turgor are normal, no rashes or significant lesions. Mild bruising with nodularity involving bilateral upper and lower extremities.  EYES: conjunctiva are pink and non-injected, sclera clear LUNGS: clear to auscultation and percussion with normal breathing effort HEART: regular rate & rhythm and no murmurs and no lower extremity edema Musculoskeletal: no cyanosis of digits and no clubbing  PSYCH: alert & oriented x 3, fluent speech NEURO: no focal motor/sensory deficits  LABORATORY DATA:  I have reviewed the data as listed    Latest Ref Rng & Units 09/29/2022    8:08 AM 09/21/2022   11:14 AM 09/16/2022    9:18 AM  CBC  WBC 4.0 - 10.5 K/uL 4.8  6.2  5.3   Hemoglobin 12.0 - 15.0 g/dL 9.6  9.1  9.3   Hematocrit 36.0 - 46.0 % 30.1  28.4  27.9   Platelets 150 - 400 K/uL 147  73  40        Latest Ref Rng & Units 09/29/2022    8:08 AM 09/21/2022   11:14 AM 09/16/2022    9:18 AM  CMP  Glucose 70 - 99 mg/dL 829  562  130   BUN 6 - 20 mg/dL 15  7  9    Creatinine 0.44 - 1.00 mg/dL 8.65  7.84  6.96   Sodium 135 - 145 mmol/L 138  140  142   Potassium 3.5 - 5.1 mmol/L 3.5  4.3  3.8   Chloride 98 - 111 mmol/L 106  108  106   CO2 22 - 32  mmol/L 25  27  28    Calcium 8.9 - 10.3 mg/dL 9.1  9.0  9.4   Total Protein 6.5 - 8.1 g/dL 7.2  7.3  7.1   Total Bilirubin 0.3 - 1.2 mg/dL 0.4  0.4  0.4   Alkaline Phos 38 - 126 U/L 199  248  210   AST 15 - 41 U/L 67  46  27   ALT 0 - 44 U/L 35  23  18      RADIOGRAPHIC STUDIES: I have personally reviewed the radiological images as listed and agreed with the findings in the report. No results found.  ASSESSMENT & PLAN Aitza Franey is a 57 y.o. female who returns to the clinic for newly diagnosed pancreatic adenocarcinoma.    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 Dr. Leonides Schanz recommends chemotherapy regimen with FOLFIRINOX q 2 weeks.  --  Plan to obtain restaging CT scans every 3 months to assess treatment response.  --[facility] Testing: MS-stable, 2 Muts/Mb, [id number]*31, KRASG12L, MDM2 amplification, CDKN2A/B. No targetable mutations.  --Started mFOLFIRINOX on 03/30/2022 --After Cycle 3 on 05/04/2022, patient was on a treatment break due to poor tolerance and patient's request. --Most recent CT CAP from 06/30/2022 showed treatment response. --Patient resumed FOLFIRINOX without dose modification on 07/21/2022  PLAN: --Due for Cycle 7, Day 1 of FOLFIRINOX tomorrow --Labs from today reviewed with patient.  White blood cell 4.8, hemoglobin 9.6, MCV 92.3, and platelets of 147 --Will proceed with chemotherapy today. Strict precautions given for bleeding.  --further dose reductions of irinotecan and oxaliplatin today.  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  --Currently on Eliquis 5 mg BID  #Neutropenia --Added Udenyca on Day 3 with Cycle 2.  --Neutropenic precautions given to patient including monitoring for fevers.   #Elevated LFTs --Most likely secondary to chemotherapy. AST 67, ALT 35, bilirubin within normal limits with a creatinine of 0.69 --Monitor for now  #Nausea/Vomiting: --Secondary to chemotherapy and well controlled.  -- Continue to take scopolamine patch, phenergan 25 mg q 6 hours, zofran 8mg  q8H PRN for nausea, zyprexa nightly PRN for nausea.   #Epigastric/RUQ/back pain: --Likely secondary to underlying malignancy --Tramadol was ineffective.  --Current pain regimen includes MS contin to 30 mg q 8 hours and Norco 10-325 mg.   #Appetite loss/weight loss: --Weight has improved since last week from 123 lbs to 127 lbs.  --Sent prescription for Marinol 2.5 mg BID, awaiting prior auth --Encouraged to eat small, frequent meals and supplement with protein shakes --Follows with [facility] nutrition team  #Family history of breast cancer: --Due to strong family history of cancer and has undergone genetic testing in the past.  --Underwent genetic testing on 04/07/2022 that was negative. 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   #Supportive Care -- chemotherapy education to be scheduled  -- port placement complete  -- EMLA cream for port  Orders Placed This Encounter  Procedures   CT CHEST ABDOMEN PELVIS W CONTRAST    Standing Status:   Future    Standing Expiration Date:   10/10/2023    Order Specific Question:   If indicated for the ordered procedure, I authorize the administration of contrast media per Radiology protocol    Answer:   Yes    Order Specific Question:   Does the patient have a contrast media/X-ray dye allergy?    Answer:   No    Order Specific Question:    Preferred imaging location?    Answer:   [facility]    Order Specific Question:   If indicated for the ordered procedure, I authorize the administration of oral contrast media per Radiology protocol    Answer:   Yes   All questions were answered. The patient knows to call the clinic with any problems, questions or concerns.  I have spent a total of 30 minutes minutes of face-to-face and non-face-to-face time, preparing to see the patient,  performing a medically appropriate examination, counseling and educating the patient, documenting clinical information in the electronic health record,  and care coordination.   Kaitlin Ferrell T.  Leonides Schanz, MD Department of Hematology/Oncology [facility] Phone: [phone] Pager: [phone] Email: [email or web address]

## 2022-10-01 ENCOUNTER — Inpatient Hospital Stay: Payer: BC Managed Care – PPO

## 2022-10-01 ENCOUNTER — Other Ambulatory Visit: Payer: Self-pay

## 2022-10-01 VITALS — BP 126/72 | HR 66 | Temp 98.1°F | Resp 13

## 2022-10-01 DIAGNOSIS — Z452 Encounter for adjustment and management of vascular access device: Secondary | ICD-10-CM | POA: Diagnosis not present

## 2022-10-01 DIAGNOSIS — G893 Neoplasm related pain (acute) (chronic): Secondary | ICD-10-CM | POA: Diagnosis not present

## 2022-10-01 DIAGNOSIS — I81 Portal vein thrombosis: Secondary | ICD-10-CM | POA: Diagnosis not present

## 2022-10-01 DIAGNOSIS — C259 Malignant neoplasm of pancreas, unspecified: Secondary | ICD-10-CM

## 2022-10-01 DIAGNOSIS — R112 Nausea with vomiting, unspecified: Secondary | ICD-10-CM | POA: Diagnosis not present

## 2022-10-01 DIAGNOSIS — Z5111 Encounter for antineoplastic chemotherapy: Secondary | ICD-10-CM | POA: Diagnosis not present

## 2022-10-01 DIAGNOSIS — E876 Hypokalemia: Secondary | ICD-10-CM | POA: Diagnosis not present

## 2022-10-01 DIAGNOSIS — Z7901 Long term (current) use of anticoagulants: Secondary | ICD-10-CM | POA: Diagnosis not present

## 2022-10-01 DIAGNOSIS — Z515 Encounter for palliative care: Secondary | ICD-10-CM | POA: Diagnosis not present

## 2022-10-01 DIAGNOSIS — I10 Essential (primary) hypertension: Secondary | ICD-10-CM | POA: Diagnosis not present

## 2022-10-01 DIAGNOSIS — D696 Thrombocytopenia, unspecified: Secondary | ICD-10-CM | POA: Diagnosis not present

## 2022-10-01 DIAGNOSIS — C787 Secondary malignant neoplasm of liver and intrahepatic bile duct: Secondary | ICD-10-CM | POA: Diagnosis not present

## 2022-10-01 DIAGNOSIS — C250 Malignant neoplasm of head of pancreas: Secondary | ICD-10-CM | POA: Diagnosis not present

## 2022-10-01 DIAGNOSIS — Z86718 Personal history of other venous thrombosis and embolism: Secondary | ICD-10-CM | POA: Diagnosis not present

## 2022-10-01 DIAGNOSIS — R7989 Other specified abnormal findings of blood chemistry: Secondary | ICD-10-CM | POA: Diagnosis not present

## 2022-10-01 LAB — CANCER ANTIGEN 19-9: CA 19-9: 929 U/mL — ABNORMAL HIGH (ref 0–35)

## 2022-10-01 MED ORDER — SODIUM CHLORIDE 0.9% FLUSH
10.0000 mL | INTRAVENOUS | Status: DC | PRN
  Administered 2022-10-01: 10 mL

## 2022-10-01 MED ORDER — PEGFILGRASTIM-CBQV 6 MG/0.6ML ~~LOC~~ SOSY
6.0000 mg | PREFILLED_SYRINGE | Freq: Once | SUBCUTANEOUS | Status: AC
  Administered 2022-10-01: 6 mg via SUBCUTANEOUS

## 2022-10-01 MED ORDER — HEPARIN SOD (PORK) LOCK FLUSH 100 UNIT/ML IV SOLN
500.0000 [IU] | Freq: Once | INTRAVENOUS | Status: AC | PRN
  Administered 2022-10-01: 500 [IU]

## 2022-10-06 ENCOUNTER — Ambulatory Visit: Payer: BC Managed Care – PPO

## 2022-10-06 ENCOUNTER — Other Ambulatory Visit: Payer: BC Managed Care – PPO

## 2022-10-06 ENCOUNTER — Ambulatory Visit: Payer: BC Managed Care – PPO | Admitting: Hematology and Oncology

## 2022-10-10 ENCOUNTER — Encounter: Payer: Self-pay | Admitting: Hematology and Oncology

## 2022-10-10 MED ORDER — APIXABAN 5 MG PO TABS: 5.0000 mg | ORAL_TABLET | Freq: Two times a day (BID) | ORAL | 2 refills | Status: DC

## 2022-10-12 ENCOUNTER — Inpatient Hospital Stay: Payer: BC Managed Care – PPO

## 2022-10-12 ENCOUNTER — Inpatient Hospital Stay: Payer: BC Managed Care – PPO | Admitting: Dietician

## 2022-10-12 ENCOUNTER — Inpatient Hospital Stay: Payer: BC Managed Care – PPO | Attending: Physician Assistant | Admitting: Physician Assistant

## 2022-10-12 VITALS — BP 130/70 | HR 51 | Temp 98.4°F | Resp 16 | Wt 121.3 lb

## 2022-10-12 DIAGNOSIS — Z95828 Presence of other vascular implants and grafts: Secondary | ICD-10-CM

## 2022-10-12 DIAGNOSIS — T451X5A Adverse effect of antineoplastic and immunosuppressive drugs, initial encounter: Secondary | ICD-10-CM | POA: Diagnosis not present

## 2022-10-12 DIAGNOSIS — R7989 Other specified abnormal findings of blood chemistry: Secondary | ICD-10-CM | POA: Insufficient documentation

## 2022-10-12 DIAGNOSIS — Z79899 Other long term (current) drug therapy: Secondary | ICD-10-CM | POA: Diagnosis not present

## 2022-10-12 DIAGNOSIS — C250 Malignant neoplasm of head of pancreas: Secondary | ICD-10-CM | POA: Diagnosis not present

## 2022-10-12 DIAGNOSIS — Z7901 Long term (current) use of anticoagulants: Secondary | ICD-10-CM | POA: Diagnosis not present

## 2022-10-12 DIAGNOSIS — D701 Agranulocytosis secondary to cancer chemotherapy: Secondary | ICD-10-CM | POA: Diagnosis not present

## 2022-10-12 DIAGNOSIS — Z5189 Encounter for other specified aftercare: Secondary | ICD-10-CM | POA: Insufficient documentation

## 2022-10-12 DIAGNOSIS — Z5111 Encounter for antineoplastic chemotherapy: Secondary | ICD-10-CM

## 2022-10-12 DIAGNOSIS — D696 Thrombocytopenia, unspecified: Secondary | ICD-10-CM | POA: Diagnosis not present

## 2022-10-12 DIAGNOSIS — Z86718 Personal history of other venous thrombosis and embolism: Secondary | ICD-10-CM | POA: Insufficient documentation

## 2022-10-12 DIAGNOSIS — C259 Malignant neoplasm of pancreas, unspecified: Secondary | ICD-10-CM | POA: Diagnosis not present

## 2022-10-12 DIAGNOSIS — C787 Secondary malignant neoplasm of liver and intrahepatic bile duct: Secondary | ICD-10-CM

## 2022-10-12 LAB — CBC WITH DIFFERENTIAL (CANCER CENTER ONLY)
Abs Immature Granulocytes: 0.02 10*3/uL (ref 0.00–0.07)
Basophils Absolute: 0 10*3/uL (ref 0.0–0.1)
Basophils Relative: 0 %
Eosinophils Absolute: 0 10*3/uL (ref 0.0–0.5)
Eosinophils Relative: 1 %
HCT: 27.7 % — ABNORMAL LOW (ref 36.0–46.0)
Hemoglobin: 8.9 g/dL — ABNORMAL LOW (ref 12.0–15.0)
Immature Granulocytes: 0 %
Lymphocytes Relative: 29 %
Lymphs Abs: 1.5 10*3/uL (ref 0.7–4.0)
MCH: 29.8 pg (ref 26.0–34.0)
MCHC: 32.1 g/dL (ref 30.0–36.0)
MCV: 92.6 fL (ref 80.0–100.0)
Monocytes Absolute: 0.8 10*3/uL (ref 0.1–1.0)
Monocytes Relative: 15 %
Neutro Abs: 2.8 10*3/uL (ref 1.7–7.7)
Neutrophils Relative %: 55 %
Platelet Count: 43 10*3/uL — ABNORMAL LOW (ref 150–400)
RBC: 2.99 MIL/uL — ABNORMAL LOW (ref 3.87–5.11)
RDW: 18.8 % — ABNORMAL HIGH (ref 11.5–15.5)
WBC Count: 5.1 10*3/uL (ref 4.0–10.5)
nRBC: 0 % (ref 0.0–0.2)

## 2022-10-12 LAB — CMP (CANCER CENTER ONLY)
ALT: 16 U/L (ref 0–44)
AST: 22 U/L (ref 15–41)
Albumin: 3.8 g/dL (ref 3.5–5.0)
Alkaline Phosphatase: 155 U/L — ABNORMAL HIGH (ref 38–126)
Anion gap: 6 (ref 5–15)
BUN: 10 mg/dL (ref 6–20)
CO2: 29 mmol/L (ref 22–32)
Calcium: 9.5 mg/dL (ref 8.9–10.3)
Chloride: 107 mmol/L (ref 98–111)
Creatinine: 0.58 mg/dL (ref 0.44–1.00)
GFR, Estimated: >60 mL/min (ref >60)
Glucose, Bld: 101 mg/dL — ABNORMAL HIGH (ref 70–99)
Potassium: 3.6 mmol/L (ref 3.5–5.1)
Sodium: 142 mmol/L (ref 135–145)
Total Bilirubin: 0.4 mg/dL (ref 0.3–1.2)
Total Protein: 7 g/dL (ref 6.5–8.1)

## 2022-10-12 MED ORDER — SODIUM CHLORIDE 0.9% FLUSH
10.0000 mL | Freq: Once | INTRAVENOUS | Status: AC
  Administered 2022-10-12: 10 mL

## 2022-10-12 NOTE — Progress Notes (Signed)
[facility] [phone]   [phone]  PROGRESS NOTE:  Patient Care Team: Angelica Chessman, MD as PCP - General (Family Medicine) Reece Packer, MD as Consulting Physician (Medical Oncology) Pickenpack-Cousar, Arty Baumgartner, NP as Nurse Practitioner (Nurse Practitioner)  CHIEF COMPLAINTS/PURPOSE OF CONSULTATION:  Metastatic pancreatic adenocarcinoma involving the liver  ONCOLOGIC HISTORY: Presented with nausea and right upper quadrant pain 03/08/2022: Abdominal US showed multiple hypoechoic masses within the liver are indeterminate.  03/10/2022: MR abdomen: Hypoenhancing mass compatible with pancreatic head adenocarcinoma with numerous targetoid enhancing masses in all segments of the liver, compatible with metastatic disease. The mass severely effaces and nearly occludes the portal vein and splenic vein, and occludes the superior mesenteric vein with some collaterals noted. The mass substantially abuts the proximal transverse duodenum, although overt duodenal invasion is indeterminate. 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 Prominent stool throughout the colon favors constipation. Fluid-fluid level in the gallbladder likely from sludge. 03/14/2022: Establish care with [facility] Hemat[facility]On[facility] 03/17/2022: CT chest: no definitive evidence of lung metastases.  03/22/2022: Liver biopsy confirmed metastatic adenocarcinoma, pancreatic primary.  03/30/2022: Cycle 1, Day 1 of mFOLFIRINOX 04/14/2022: Cycle 2, Day 1 of mFOLFIRINOX HELD due to ANC 300.  04/20/2022: Cycle 2, Day 1 of mFOLFIRINOX 05/04/2022: Cycle 3, Day 1 of  mFOLFIRINOX 05/19/2022: Cycle 4, Day 1 of mFOLFIRINOX HELD per patient request.  07/21/2022: Cycle 4, Day 1 of mFOLFIRINOX 08/04/2022: Cycle 5, Day 1 of mFOLFIRINOX HELD due to Plt 81K 08/17/2022: Cycle 5, Day 1 of mFOLFIRINOX 09/01/2022: Cycle 6, Day 1 of mFOLFIRINOX 09/14/2022: Cycle 7, Day 1 of mFOLFIRINOX HELD due to Plt 31K 09/22/2022: Cycle 7, Day 1 of mFOLFIRINOX HELD due to Plt 73K 09/29/2022: Cycle 7, Day 1 of mFOLFIRINOX 10/12/2022: Cycle 8, Day 1 of mFOLFIRINOX HELD due to Plt 43K   HISTORY OF PRESENTING ILLNESS:  Kaitlin Ferrell presents today for a follow up. She was last seen on 10/12/2022.  Patient presents today prior to Cycle 8, Day 1 scheduled for tomorrow.   Ms. Dimmock reports reports fair appetite with decreased weight loss since 09/29/22. She reports feeling persistent fatigue. She is able to complete her basic ADLs on her own. She denies nausea, vomiting or bowel habit changes. She still has some bruising and nodularity, new in her abdomen over the last two weeks which are improving. She denies any signs of active bleeding. She denies fevers, chills, sweats, shortness of breath, chest pain or cough. She has no other complaints. Rest of the 10 point ROS is below.   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   treated with 4 cycles of actinomycin D from 12-02-11 thru 01-13-12   H/O molar pregnancy, antepartum    Hypertension    Migraine    Miscarriage     SURGICAL HISTORY: Past Surgical History:  Procedure Laterality Date   BREAST BIOPSY  2001   Left   BREAST LUMPECTOMY     DILATION AND CURETTAGE OF UTERUS  1992,  09/2011   DILATION AND EVACUATION  2011   INSERTION OF MESH N/A 09/03/2013   Procedure: INSERTION OF MESH;  Surgeon: Ardeth Sportsman, MD;  Location: [facility];   Service: General;  Laterality: N/A;   IR CHEST FLUORO  04/14/2022   IR IMAGING GUIDED PORT INSERTION  03/22/2022   IR IMAGING GUIDED PORT INSERTION  05/02/2022   IR PORT REPAIR CENTRAL VENOUS ACCESS DEVICE  04/19/2022   IR REMOVAL TUN CV CATH W/O FL  05/02/2022   IR US LIVER BIOPSY  03/22/2022   PORTACATH PLACEMENT  11/2011   PORTACATH PLACEMENT     REMOVAL  MARCH 2014   VENTRAL HERNIA REPAIR N/A 09/03/2013   Procedure: LAPAROSCOPIC VENTRAL WALL HERNIA REPAIR;  Surgeon: Ardeth Sportsman, MD;  Location: [facility];  Service: General;  Laterality: N/A;   WISDOM TOOTH EXTRACTION     in 11th grade    SOCIAL HISTORY: Social History   Socioeconomic History   Marital status: Married    Spouse name: [name]   Number of children: 2   Years of education: college   Highest education level: Not on file  Occupational History    Comment: [employer]  Tobacco Use   Smoking status: Never   Smokeless tobacco: Never  Substance and Sexual Activity   Alcohol use: Yes    Alcohol/week: 1.0 standard drink of alcohol    Types: 1 Glasses of wine per week    Comment: occas   Drug use: No   Sexual activity: Not Currently    Birth control/protection: Pill  Other Topics Concern   Not on file  Social History Narrative   Patient is married [name]). Patient works part time at [employer].   Right handed.   Patient has her CMA.   Caffeine- None         Social Determinants of Health   Financial Resource Strain: Not on file  Food Insecurity: No Food Insecurity (05/10/2022)   Hunger Vital Sign    Worried About Running Out of Food in the Last Year: Never true    Ran Out of Food in the Last Year: Never true  Transportation Needs: No Transportation Needs (05/10/2022)   PRAPARE - Administrator, Civil Service (Medical): No    Lack of Transportation (Non-Medical): No  Physical Activity: Not on file  Stress: Not on file  Social Connections: Not on file  Intimate Partner Violence: Not At Risk (05/10/2022)    Humiliation, Afraid, Rape, and Kick questionnaire    Fear of Current or Ex-Partner: No    Emotionally Abused: No    Physically Abused: No    Sexually Abused: No    FAMILY HISTORY: Family History  Problem Relation Age of Onset   Breast cancer Mother        dx > 50   Heart attack Mother    Diabetes Father    Breast cancer Maternal Aunt        dx > 50   Breast cancer Maternal Aunt        dx > 50   Breast cancer Maternal Grandmother        ? < 50   Stroke Paternal Grandmother    Alcoholism Other     ALLERGIES:  is allergic to compazine [prochlorperazine edisylate], propofol, oxycodone-acetaminophen, prochlorperazine, and labetalol.  MEDICATIONS:  Current Outpatient Medications  Medication Sig Dispense Refill   amLODipine (NORVASC) 10 MG tablet Take 1 tablet by mouth daily.  apixaban (ELIQUIS) 5 MG TABS tablet Take 1 tablet (5 mg total) by mouth 2 (two) times daily. 60 tablet 2   Cholecalciferol 1.25 MG (50000 UT) capsule Take 50,000 Units by mouth once a week.     Cyanocobalamin (VITAMIN B-12 CR PO) Take by mouth.     diphenoxylate-atropine (LOMOTIL) 2.5-0.025 MG tablet Take 1 tablet by mouth 4 (four) times daily as needed for diarrhea or loose stools. 30 tablet 0   dronabinol (MARINOL) 2.5 MG capsule Take 1 capsule (2.5 mg total) by mouth 2 (two) times daily before a meal. 60 capsule 0   ELDERBERRY PO Take by mouth.     eletriptan (RELPAX) 40 MG tablet Take 1 tablet (40 mg total) by mouth as needed for migraine or headache. May repeat in 2 hours if needed. 15 tablet 6   FIBER ADULT GUMMIES PO Take by mouth.     Folic Acid (FOLATE PO) Place 1 mg under the tongue daily.     hydrochlorothiazide (MICROZIDE) 12.5 MG capsule Take 12.5 mg by mouth daily as needed (swelling).     lidocaine-prilocaine (EMLA) cream Apply 1 Application topically as needed. 30 g 2   LORazepam (ATIVAN) 0.5 MG tablet Take 0.5 mg by mouth as needed for anxiety.     magic mouthwash (multi-ingredient)  oral suspension Swish and swallow 5 mLs by mouth 4 times daily as needed for throat discomfort 400 mL 1   magnesium oxide (MAG-OX) 400 (241.3 MG) MG tablet TAKE 1 TABLET BY MOUTH TWICE A DAY 60 tablet 6   morphine (MS CONTIN) 30 MG 12 hr tablet Take 1 tablet (30 mg total) by mouth every 8 (eight) hours. 90 tablet 0   morphine (MSIR) 15 MG tablet Take 1 tablet (15 mg total) by mouth every 6 (six) hours as needed for severe pain or moderate pain. 45 tablet 0   OLANZapine (ZYPREXA) 10 MG tablet TAKE 1 TABLET BY MOUTH EVERYDAY AT BEDTIME 90 tablet 1   ondansetron (ZOFRAN) 8 MG tablet Take 1 tablet (8 mg total) by mouth every 8 (eight) hours as needed for nausea or vomiting. 90 tablet 0   ondansetron (ZOFRAN-ODT) 8 MG disintegrating tablet Take 1 tablet (8 mg total) by mouth every 8 (eight) hours as needed for nausea or vomiting. 20 tablet 0   promethazine (PHENERGAN) 25 MG tablet Take 1 tablet (25 mg total) by mouth every 6 (six) hours as needed for nausea or vomiting. 30 tablet 0   scopolamine (TRANSDERM-SCOP) 1 MG/3DAYS Place 1 patch (1.5 mg total) onto the skin every 3 (three) days. 10 patch 12   mirtazapine (REMERON) 7.5 MG tablet Take 1 tablet (7.5 mg total) by mouth at bedtime. (Patient not taking: Reported on 10/12/2022) 30 tablet 0   No current facility-administered medications for this visit.    REVIEW OF SYSTEMS:   Constitutional: ( - ) fevers, ( - )  chills , ( - ) night sweats Eyes: ( - ) blurriness of vision, ( - ) double vision, ( - ) watery eyes Ears, nose, mouth, throat, and face: ( - ) mucositis, ( - ) sore throat Respiratory: ( - ) cough, ( - ) dyspnea, ( - ) wheezes Cardiovascular: ( - ) palpitation, ( - ) chest discomfort, ( - ) lower extremity swelling Gastrointestinal:  (+) nausea, ( - ) heartburn, ( -) change in bowel habits Skin: ( - ) abnormal skin rashes Lymphatics: ( - ) new lymphadenopathy, ( + ) easy bruising Neurological: ( - )  numbness, ( - ) tingling, ( - ) new  weaknesses Behavioral/Psych: ( - ) mood change, ( - ) new changes  All other systems were reviewed with the patient and are negative.  PHYSICAL EXAMINATION: ECOG PERFORMANCE STATUS: 1 - Symptomatic but completely ambulatory  Vitals:   10/12/22 0951 10/12/22 0952  BP: (!) 119/46 130/70  Pulse: (!) 51   Resp: 16   Temp: 98.4 F (36.9 C)   SpO2: 100%      Filed Weights   10/12/22 0951  Weight: 121 lb 4.8 oz (55 kg)    GENERAL: well appearing female in NAD  SKIN: skin color, texture, turgor are normal, no rashes or significant lesions. Mild bruising with nodularity involving bilateral upper and lower extremities.  EYES: conjunctiva are pink and non-injected, sclera clear LUNGS: clear to auscultation and percussion with normal breathing effort HEART: regular rate & rhythm and no murmurs and no lower extremity edema Musculoskeletal: no cyanosis of digits and no clubbing  PSYCH: alert & oriented x 3, fluent speech NEURO: no focal motor/sensory deficits  LABORATORY DATA:  I have reviewed the data as listed    Latest Ref Rng & Units 10/12/2022    9:23 AM 09/29/2022    8:08 AM 09/21/2022   11:14 AM  CBC  WBC 4.0 - 10.5 K/uL 5.1  4.8  6.2   Hemoglobin 12.0 - 15.0 g/dL 8.9  9.6  9.1   Hematocrit 36.0 - 46.0 % 27.7  30.1  28.4   Platelets 150 - 400 K/uL 43  147  73        Latest Ref Rng & Units 09/29/2022    8:08 AM 09/21/2022   11:14 AM 09/16/2022    9:18 AM  CMP  Glucose 70 - 99 mg/dL 401  027  253   BUN 6 - 20 mg/dL 15  7  9    Creatinine 0.44 - 1.00 mg/dL 6.64  4.03  4.74   Sodium 135 - 145 mmol/L 138  140  142   Potassium 3.5 - 5.1 mmol/L 3.5  4.3  3.8   Chloride 98 - 111 mmol/L 106  108  106   CO2 22 - 32 mmol/L 25  27  28    Calcium 8.9 - 10.3 mg/dL 9.1  9.0  9.4   Total Protein 6.5 - 8.1 g/dL 7.2  7.3  7.1   Total Bilirubin 0.3 - 1.2 mg/dL 0.4  0.4  0.4   Alkaline Phos 38 - 126 U/L 199  248  210   AST 15 - 41 U/L 67  46  27   ALT 0 - 44 U/L 35  23  18       RADIOGRAPHIC STUDIES: I have personally reviewed the radiological images as listed and agreed with the findings in the report. No results found.  ASSESSMENT & PLAN Aurore Cutino is a 57 y.o. female who returns to the clinic for newly diagnosed pancreatic adenocarcinoma.    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 Dr. Leonides Schanz recommends chemotherapy regimen with FOLFIRINOX q 2 weeks.  --Plan to obtain restaging CT scans every 3 months to assess treatment response.  --[facility] Testing: MS-stable, 2 Muts/Mb, ATMH689fs*31, KRASG12L, MDM2 amplification, CDKN2A/B. No targetable mutations.  --Started mFOLFIRINOX on 03/30/2022 --After Cycle 3 on 05/04/2022, patient was on a  treatment break due to poor tolerance and patient's request. --Most recent CT CAP from 06/30/2022 showed treatment response. --Patient resumed FOLFIRINOX without dose modification on 07/21/2022  --Dose reduced irinotecan from 125 mg/m2 to 100 mg/m2 and oxaliplatin from 85 mg/m2 to 65 mg/m2 starting Cycle 7, Day 1 due to persistent cytopenias.  PLAN: --Due for Cycle 8, Day 1 of FOLFIRINOX tomorrow --Labs from today reviewed with patient.  White blood cell 5.1, hemoglobin 8.9, MCV 92.6, and platelets of 43K. --Will HOLD chemotherapy tomorrow due to persistent thrombocytopenia. Strict precautions given for bleeding.  --Will plan to further dose reduce chemotherapy to prevent worsening thrombocytopenia in the future.  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  --Currently on Eliquis 5 mg BID  #Neutropenia --Added Udenyca on Day 3 with Cycle 2.  --Neutropenic precautions given to patient including monitoring for fevers.   #Elevated LFTs --Most likely secondary to chemotherapy. AST 22, ALT 16, Alk Phos 155 --Monitor for now  #Nausea/Vomiting: --Secondary to chemotherapy and well controlled.  -- Continue to take scopolamine patch, phenergan 25 mg q 6 hours, zofran 8mg  q8H PRN for nausea, zyprexa nightly PRN for nausea.   #Epigastric/RUQ/back pain: --Likely secondary to underlying malignancy --Tramadol was ineffective.  --Current pain regimen includes MS contin to 30 mg q 8 hours and Norco 10-325 mg.   #Appetite loss/weight loss: --Weight has declined from 125 lbs to 121 lbs over the last two weeks.  --Sent prescription for Marinol 2.5 mg BID, started one week ago.  --Encouraged to eat small, frequent meals and supplement with protein shakes --Follows with [facility] nutrition team  #Family history of breast cancer: --Due to strong family history of cancer and has undergone genetic testing in the past.  --Underwent genetic testing on 04/07/2022 that was negative. 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   #Supportive Care -- chemotherapy education to be scheduled  -- port placement complete  -- EMLA cream for port  No orders of the defined types were placed in this encounter.  All questions were answered. The patient knows to call the clinic with any problems, questions or concerns.  I have spent a total of 30 minutes minutes of face-to-face and non-face-to-face time, preparing to see the patient,  performing a medically appropriate examination, counseling and educating the patient, documenting clinical information in the electronic health record,  and care coordination.   Georga Kaufmann  PA-C Dept of Hematology and Oncology [facility] Phone: [phone]

## 2022-10-12 NOTE — Progress Notes (Signed)
Nutrition Follow-up:  Patient with pancreatic carcinoma metastatic to liver. She is receiving modified Folfirinox q14d (start 2/20)   Treatment held today. Met with pt and husband in clinic. Pt has no appetite. Husband says he made her eat a plum yesterday. This is all she had. Pt started marinol for appetite ~one week ago. Pt reports taste of foods is off. She denies nausea, vomiting, diarrhea, constipation.   Medications: reviewed   Labs: reviewed   Anthropometrics: Wt 121 lb 4.8 oz today  8/22 - 125 lb 12.8 oz 8/7 - 123 lb 4.8 oz 7/10 - 129 lb 4.8 oz   NUTRITION DIAGNOSIS: Unintended wt loss continues     INTERVENTION:  Ongoing education and encouragement for small frequent meals/snacks q2h Suggested setting "snack time" alarms  Continue CIB with fairlife milk - 2-3/day for added calories/protein Reviewed strategies for altered taste, encouraged baking soda salt water rinses several times daily and before meals    MONITORING, EVALUATION, GOAL: wt trends, intake   NEXT VISIT: Monday September 16 during infusion with Drema Halon

## 2022-10-13 ENCOUNTER — Ambulatory Visit: Payer: BC Managed Care – PPO | Admitting: Hematology and Oncology

## 2022-10-13 ENCOUNTER — Other Ambulatory Visit: Payer: BC Managed Care – PPO

## 2022-10-13 ENCOUNTER — Ambulatory Visit: Payer: BC Managed Care – PPO

## 2022-10-20 ENCOUNTER — Ambulatory Visit (HOSPITAL_COMMUNITY)
Admission: RE | Admit: 2022-10-20 | Discharge: 2022-10-20 | Disposition: A | Discharge status: Home / Self Care | Payer: BC Managed Care – PPO | Source: Ambulatory Visit | Attending: Hematology and Oncology | Admitting: Hematology and Oncology

## 2022-10-20 ENCOUNTER — Other Ambulatory Visit: Payer: BC Managed Care – PPO

## 2022-10-20 ENCOUNTER — Ambulatory Visit: Payer: BC Managed Care – PPO | Admitting: Hematology and Oncology

## 2022-10-20 ENCOUNTER — Other Ambulatory Visit (HOSPITAL_COMMUNITY): Payer: Self-pay

## 2022-10-20 ENCOUNTER — Ambulatory Visit: Payer: BC Managed Care – PPO

## 2022-10-20 DIAGNOSIS — R590 Localized enlarged lymph nodes: Secondary | ICD-10-CM | POA: Diagnosis not present

## 2022-10-20 DIAGNOSIS — C801 Malignant (primary) neoplasm, unspecified: Secondary | ICD-10-CM | POA: Diagnosis not present

## 2022-10-20 DIAGNOSIS — C259 Malignant neoplasm of pancreas, unspecified: Secondary | ICD-10-CM | POA: Insufficient documentation

## 2022-10-20 DIAGNOSIS — R188 Other ascites: Secondary | ICD-10-CM | POA: Diagnosis not present

## 2022-10-20 MED ORDER — HEPARIN SOD (PORK) LOCK FLUSH 100 UNIT/ML IV SOLN
INTRAVENOUS | Status: AC
  Filled 2022-10-20: qty 5

## 2022-10-20 MED ORDER — IOHEXOL 300 MG/ML  SOLN
100.0000 mL | Freq: Once | INTRAMUSCULAR | Status: AC | PRN
  Administered 2022-10-20: 100 mL via INTRAVENOUS

## 2022-10-20 MED ORDER — HEPARIN SOD (PORK) LOCK FLUSH 100 UNIT/ML IV SOLN: 500.0000 [IU] | Freq: Once | INTRAVENOUS | Status: DC

## 2022-10-21 MED FILL — Dexamethasone Sodium Phosphate Inj 100 MG/10ML: INTRAMUSCULAR | Qty: 1 | Status: AC

## 2022-10-21 MED FILL — Fosaprepitant Dimeglumine For IV Infusion 150 MG (Base Eq): INTRAVENOUS | Qty: 5 | Status: AC

## 2022-10-23 NOTE — Progress Notes (Unsigned)
Patient Care Team: Angelica Chessman, MD as PCP - General (Family Medicine) Reece Packer, MD as Consulting Physician (Medical Oncology) Pickenpack-Cousar, Arty Baumgartner, NP as Nurse Practitioner (Nurse Practitioner)   CHIEF COMPLAINT: Follow up metastatic pancreatic cancer   CURRENT THERAPY: mFOLFIRINOX q14 days   INTERVAL HISTORY Kaitlin Ferrell returns for follow up and treatment as scheduled. Last seen by Georga Kaufmann, PA 10/12/22, C8 was postponed for thrombocytopenia. She had restaging CT on 9/12. Doing well today overall. Tolerating treatment with similar fatigue, n/v/d. Home regimen is effective. Pain is stable, manageable. Pain is mostly abdominal, with occasional back pain. Cold sensitivity/neuropathy not really an issue.   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   treated with 4 cycles of actinomycin D from 12-02-11 thru 01-13-12   H/O molar pregnancy, antepartum    Hypertension    Migraine    Miscarriage      Past Surgical History:  Procedure Laterality Date   BREAST BIOPSY  2001   Left   BREAST LUMPECTOMY     DILATION AND CURETTAGE OF UTERUS  1992, 09/2011   DILATION AND EVACUATION  2011   INSERTION OF MESH N/A 09/03/2013   Procedure: INSERTION OF MESH;  Surgeon: Ardeth Sportsman, MD;  Location: [facility];  Service: General;  Laterality: N/A;   IR CHEST FLUORO  04/14/2022   IR IMAGING GUIDED PORT INSERTION  03/22/2022   IR IMAGING GUIDED PORT INSERTION  05/02/2022   IR PORT REPAIR CENTRAL VENOUS ACCESS DEVICE  04/19/2022   IR REMOVAL TUN CV CATH W/O FL  05/02/2022   IR US LIVER BIOPSY  03/22/2022   PORTACATH PLACEMENT  11/2011   PORTACATH PLACEMENT     REMOVAL  MARCH 2014   VENTRAL HERNIA REPAIR N/A 09/03/2013   Procedure: LAPAROSCOPIC VENTRAL WALL  HERNIA REPAIR;  Surgeon: Ardeth Sportsman, MD;  Location: [facility];  Service: General;  Laterality: N/A;   WISDOM TOOTH EXTRACTION     in 11th grade     Outpatient Encounter Medications as of 10/24/2022  Medication Sig   amLODipine (NORVASC) 10 MG tablet Take 1 tablet by mouth daily.   apixaban (ELIQUIS) 5 MG TABS tablet Take 1 tablet (5 mg total) by mouth 2 (two) times daily.   Cholecalciferol 1.25 MG (50000 UT) capsule Take 50,000 Units by mouth once a week.   Cyanocobalamin (VITAMIN B-12 CR PO) Take by mouth.   diphenoxylate-atropine (LOMOTIL) 2.5-0.025 MG tablet Take 1 tablet by mouth 4 (four) times daily as needed for diarrhea or loose stools.   dronabinol (MARINOL) 2.5 MG capsule Take 1 capsule (2.5 mg total) by mouth 2 (two) times daily before a meal.   ELDERBERRY PO Take by mouth.   eletriptan (RELPAX) 40 MG tablet Take 1 tablet (40 mg total) by mouth as needed for migraine or headache. May repeat in 2 hours if needed.   FIBER ADULT GUMMIES PO Take by mouth.   Folic Acid (FOLATE PO) Place 1 mg under the tongue daily.   hydrochlorothiazide (MICROZIDE) 12.5 MG capsule Take 12.5 mg by mouth daily as needed (swelling).   lidocaine-prilocaine (EMLA) cream Apply 1 Application topically as needed.   LORazepam (ATIVAN) 0.5 MG tablet Take 0.5 mg by mouth as needed for anxiety.  magic mouthwash (multi-ingredient) oral suspension Swish and swallow 5 mLs by mouth 4 times daily as needed for throat discomfort   magnesium oxide (MAG-OX) 400 (241.3 MG) MG tablet TAKE 1 TABLET BY MOUTH TWICE A DAY   morphine (MS CONTIN) 30 MG 12 hr tablet Take 1 tablet (30 mg total) by mouth every 8 (eight) hours.   morphine (MSIR) 15 MG tablet Take 1 tablet (15 mg total) by mouth every 6 (six) hours as needed for severe pain or moderate pain.   OLANZapine (ZYPREXA) 10 MG tablet TAKE 1 TABLET BY MOUTH EVERYDAY AT BEDTIME   ondansetron (ZOFRAN) 8 MG tablet Take 1 tablet (8 mg total) by mouth every 8 (eight) hours as  needed for nausea or vomiting.   ondansetron (ZOFRAN-ODT) 8 MG disintegrating tablet Take 1 tablet (8 mg total) by mouth every 8 (eight) hours as needed for nausea or vomiting.   promethazine (PHENERGAN) 25 MG tablet Take 1 tablet (25 mg total) by mouth every 6 (six) hours as needed for nausea or vomiting.   scopolamine (TRANSDERM-SCOP) 1 MG/3DAYS Place 1 patch (1.5 mg total) onto the skin every 3 (three) days.   No facility-administered encounter medications on file as of 10/24/2022.     Today's Vitals   10/24/22 1038 10/24/22 1049  BP: 113/74   Pulse: 77   Resp: 16   Temp: 97.8 F (36.6 C)   TempSrc: Temporal   SpO2: 99%   Weight: 120 lb (54.4 kg)   Height: 5\' 2"  (1.575 m)   PainSc:  0-No pain   Body mass index is 21.95 kg/m.   PHYSICAL EXAM GENERAL:alert, no distress and comfortable SKIN: no rash  EYES: sclera clear NECK: without mass LYMPH:  no palpable cervical or supraclavicular lymphadenopathy  LUNGS: clear with normal breathing effort HEART: regular rate & rhythm, no lower extremity edema ABDOMEN: abdomen soft, non-tender and normal bowel sounds NEURO: alert & oriented x 3 with fluent speech, no focal motor deficits PAC without erythema    CBC    Component Value Date/Time   WBC 4.6 10/24/2022 1004   WBC 4.8 05/10/2022 0601   RBC 3.14 (L) 10/24/2022 1004   HGB 9.4 (L) 10/24/2022 1004   HGB 11.8 02/21/2012 1205   HCT 29.7 (L) 10/24/2022 1004   HCT 36.0 02/21/2012 1205   PLT 133 (L) 10/24/2022 1004   PLT 165 02/21/2012 1205   MCV 94.6 10/24/2022 1004   MCV 87.7 02/21/2012 1205   MCH 29.9 10/24/2022 1004   MCHC 31.6 10/24/2022 1004   RDW 18.6 (H) 10/24/2022 1004   RDW 14.2 02/21/2012 1205   LYMPHSABS 1.5 10/24/2022 1004   LYMPHSABS 1.6 02/21/2012 1205   MONOABS 0.8 10/24/2022 1004   MONOABS 0.3 02/21/2012 1205   EOSABS 0.0 10/24/2022 1004   EOSABS 0.2 02/21/2012 1205   BASOSABS 0.0 10/24/2022 1004   BASOSABS 0.0 02/21/2012 1205     CMP      Component Value Date/Time   NA 139 10/24/2022 1004   NA 140 02/21/2012 1205   K 3.8 10/24/2022 1004   K 3.4 (L) 02/21/2012 1205   CL 105 10/24/2022 1004   CL 111 (H) 02/21/2012 1205   CO2 28 10/24/2022 1004   CO2 20 (L) 02/21/2012 1205   GLUCOSE 120 (H) 10/24/2022 1004   GLUCOSE 70 02/21/2012 1205   BUN 11 10/24/2022 1004   BUN 11.0 02/21/2012 1205   CREATININE 0.58 10/24/2022 1004   CREATININE 0.9 02/21/2012 1205   CALCIUM 9.5  10/24/2022 1004   CALCIUM 9.2 02/21/2012 1205   PROT 7.3 10/24/2022 1004   PROT 7.6 02/21/2012 1205   ALBUMIN 4.0 10/24/2022 1004   ALBUMIN 3.8 02/21/2012 1205   AST 61 (H) 10/24/2022 1004   AST 13 02/21/2012 1205   ALT 24 10/24/2022 1004   ALT <6 Repeated and Verified 02/21/2012 1205   ALKPHOS 143 (H) 10/24/2022 1004   ALKPHOS 40 02/21/2012 1205   BILITOT 0.4 10/24/2022 1004   BILITOT 0.45 02/21/2012 1205   GFRNONAA >60 10/24/2022 1004     ASSESSMENT & PLAN: 57 yo female with   #Adenocarcinoma of the pancreas, with liver metastasis, stage IV Presented with nausea and right upper quadrant pain 03/08/2022: Abdominal US showed multiple hypoechoic masses within the liver are indeterminate.  03/10/2022: MR abdomen: Hypoenhancing mass compatible with pancreatic head adenocarcinoma with numerous targetoid enhancing masses in all segments of the liver, compatible with metastatic disease. The mass severely effaces and nearly occludes the portal vein and splenic vein, and occludes the superior mesenteric vein with some collaterals noted. The mass substantially abuts the proximal transverse duodenum, although overt duodenal invasion is indeterminate. 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 Prominent stool throughout the colon favors constipation. Fluid-fluid level in the gallbladder likely from sludge. 03/14/2022: Establish care with [facility] Hematology/Oncology 03/17/2022: CT chest: no definitive evidence of lung metastases.  03/22/2022: Liver biopsy confirmed metastatic adenocarcinoma, pancreatic primary.  03/30/2022: Cycle 1, Day 1 of mFOLFIRINOX 04/14/2022: Cycle 2, Day 1 of mFOLFIRINOX HELD due to ANC 300.  04/20/2022: Cycle 2, Day 1 of mFOLFIRINOX 05/04/2022: Cycle 3, Day 1 of mFOLFIRINOX 05/19/2022: Cycle 4, Day 1 of mFOLFIRINOX HELD per patient request.  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  -Currently on Eliquis 5 mg BID  Dispo:  Kaitlin Ferrell appears stable. S/p 7 cycles mFOLFIRINOX, tolerating moderately well with fatigue, n/v/d, and thrombocytopenia. Pain is stable. Side effects are adequately managed with supportive care at home. She is able to recover and function well.   Labs and restaging CT reviewed, showing decreased size of primary pancreatic mass, mixed response in liver and abdominal lymphadenopathy, and newly evident sclerotic bone lesion in L1. Clinically she is doing well, and CA 19-9 has fluctuated  some but overall decreased since starting chemo. Will draw level today.   The plan is to proceed with C8 mFOLFIRINOX today, anticipate change of therapy soon. Pt seen with Dr. Leonides Schanz.   She will f/up  with nutrition and palliative care today during as scheduled.  Orders Placed This Encounter  Procedures   CA 19.9    Standing Status:   Future    Standing Expiration Date:   10/24/2023      All questions were answered. The patient knows to call the clinic with any problems, questions or concerns. No barriers to learning were detected.   Santiago Glad, NP-C 10/24/2022  I have read the above note and personally examined the patient. I agree with the assessment and plan as noted above.  Briefly Kaitlin Ferrell is a 57 year old female with medical history significant for metastatic pancreatic cancer.  She has been on FOLFIRINOX chemotherapy but is struggled with cytopenias.  We have gone through dose reductions, but unfortunately on her most recent CT scan she was found to have a mixed response with areas of progression.  Due to concern for this finding we recommend transitioning her to gemcitabine and paclitaxel away from FOLFIRINOX.  We will proceed with today's chemotherapy treatment and plan for her next infusion session in 2 weeks to be the start of the gemcitabine and paclitaxel instead.  The patient and her husband voiced understanding of our findings and the plan moving forward.   Ulysees Barns, MD Department of Hematology/Oncology [facility] Phone: [phone] Pager: [phone] Email: [email or web address]

## 2022-10-24 ENCOUNTER — Encounter: Payer: Self-pay | Admitting: Nurse Practitioner

## 2022-10-24 ENCOUNTER — Inpatient Hospital Stay: Payer: BC Managed Care – PPO

## 2022-10-24 ENCOUNTER — Inpatient Hospital Stay (HOSPITAL_BASED_OUTPATIENT_CLINIC_OR_DEPARTMENT_OTHER): Payer: BC Managed Care – PPO | Admitting: Nurse Practitioner

## 2022-10-24 VITALS — BP 113/74 | HR 77 | Temp 97.8°F | Resp 16 | Ht 62.0 in | Wt 120.0 lb

## 2022-10-24 VITALS — BP 118/67 | HR 61 | Temp 98.1°F | Resp 16

## 2022-10-24 DIAGNOSIS — Z95828 Presence of other vascular implants and grafts: Secondary | ICD-10-CM

## 2022-10-24 DIAGNOSIS — Z515 Encounter for palliative care: Secondary | ICD-10-CM | POA: Diagnosis not present

## 2022-10-24 DIAGNOSIS — C787 Secondary malignant neoplasm of liver and intrahepatic bile duct: Secondary | ICD-10-CM

## 2022-10-24 DIAGNOSIS — T451X5A Adverse effect of antineoplastic and immunosuppressive drugs, initial encounter: Secondary | ICD-10-CM | POA: Diagnosis not present

## 2022-10-24 DIAGNOSIS — C259 Malignant neoplasm of pancreas, unspecified: Secondary | ICD-10-CM

## 2022-10-24 DIAGNOSIS — Z5189 Encounter for other specified aftercare: Secondary | ICD-10-CM | POA: Diagnosis not present

## 2022-10-24 DIAGNOSIS — R634 Abnormal weight loss: Secondary | ICD-10-CM

## 2022-10-24 DIAGNOSIS — D696 Thrombocytopenia, unspecified: Secondary | ICD-10-CM | POA: Diagnosis not present

## 2022-10-24 DIAGNOSIS — G893 Neoplasm related pain (acute) (chronic): Secondary | ICD-10-CM | POA: Diagnosis not present

## 2022-10-24 DIAGNOSIS — R630 Anorexia: Secondary | ICD-10-CM | POA: Diagnosis not present

## 2022-10-24 DIAGNOSIS — R7989 Other specified abnormal findings of blood chemistry: Secondary | ICD-10-CM | POA: Diagnosis not present

## 2022-10-24 DIAGNOSIS — Z86718 Personal history of other venous thrombosis and embolism: Secondary | ICD-10-CM | POA: Diagnosis not present

## 2022-10-24 DIAGNOSIS — Z5111 Encounter for antineoplastic chemotherapy: Secondary | ICD-10-CM | POA: Diagnosis not present

## 2022-10-24 DIAGNOSIS — D701 Agranulocytosis secondary to cancer chemotherapy: Secondary | ICD-10-CM | POA: Diagnosis not present

## 2022-10-24 DIAGNOSIS — Z79899 Other long term (current) drug therapy: Secondary | ICD-10-CM | POA: Diagnosis not present

## 2022-10-24 DIAGNOSIS — C250 Malignant neoplasm of head of pancreas: Secondary | ICD-10-CM | POA: Diagnosis not present

## 2022-10-24 DIAGNOSIS — Z7901 Long term (current) use of anticoagulants: Secondary | ICD-10-CM | POA: Diagnosis not present

## 2022-10-24 LAB — CMP (CANCER CENTER ONLY)
ALT: 24 U/L (ref 0–44)
AST: 61 U/L — ABNORMAL HIGH (ref 15–41)
Albumin: 4 g/dL (ref 3.5–5.0)
Alkaline Phosphatase: 143 U/L — ABNORMAL HIGH (ref 38–126)
Anion gap: 6 (ref 5–15)
BUN: 11 mg/dL (ref 6–20)
CO2: 28 mmol/L (ref 22–32)
Calcium: 9.5 mg/dL (ref 8.9–10.3)
Chloride: 105 mmol/L (ref 98–111)
Creatinine: 0.58 mg/dL (ref 0.44–1.00)
GFR, Estimated: >60 mL/min (ref >60)
Glucose, Bld: 120 mg/dL — ABNORMAL HIGH (ref 70–99)
Potassium: 3.8 mmol/L (ref 3.5–5.1)
Sodium: 139 mmol/L (ref 135–145)
Total Bilirubin: 0.4 mg/dL (ref 0.3–1.2)
Total Protein: 7.3 g/dL (ref 6.5–8.1)

## 2022-10-24 LAB — CBC WITH DIFFERENTIAL (CANCER CENTER ONLY)
Abs Immature Granulocytes: 0.03 10*3/uL (ref 0.00–0.07)
Basophils Absolute: 0 10*3/uL (ref 0.0–0.1)
Basophils Relative: 0 %
Eosinophils Absolute: 0 10*3/uL (ref 0.0–0.5)
Eosinophils Relative: 1 %
HCT: 29.7 % — ABNORMAL LOW (ref 36.0–46.0)
Hemoglobin: 9.4 g/dL — ABNORMAL LOW (ref 12.0–15.0)
Immature Granulocytes: 1 %
Lymphocytes Relative: 33 %
Lymphs Abs: 1.5 10*3/uL (ref 0.7–4.0)
MCH: 29.9 pg (ref 26.0–34.0)
MCHC: 31.6 g/dL (ref 30.0–36.0)
MCV: 94.6 fL (ref 80.0–100.0)
Monocytes Absolute: 0.8 10*3/uL (ref 0.1–1.0)
Monocytes Relative: 17 %
Neutro Abs: 2.2 10*3/uL (ref 1.7–7.7)
Neutrophils Relative %: 48 %
Platelet Count: 133 10*3/uL — ABNORMAL LOW (ref 150–400)
RBC: 3.14 MIL/uL — ABNORMAL LOW (ref 3.87–5.11)
RDW: 18.6 % — ABNORMAL HIGH (ref 11.5–15.5)
WBC Count: 4.6 10*3/uL (ref 4.0–10.5)
nRBC: 0 % (ref 0.0–0.2)

## 2022-10-24 MED ORDER — SODIUM CHLORIDE 0.9 % IV SOLN
400.0000 mg/m2 | Freq: Once | INTRAVENOUS | Status: AC
  Administered 2022-10-24: 632 mg via INTRAVENOUS
  Filled 2022-10-24: qty 31.6

## 2022-10-24 MED ORDER — SODIUM CHLORIDE 0.9 % IV SOLN
2400.0000 mg/m2 | INTRAVENOUS | Status: DC
  Administered 2022-10-24: 3500 mg via INTRAVENOUS
  Filled 2022-10-24: qty 70

## 2022-10-24 MED ORDER — OXALIPLATIN CHEMO INJECTION 100 MG/20ML
65.0000 mg/m2 | Freq: Once | INTRAVENOUS | Status: AC
  Administered 2022-10-24: 100 mg via INTRAVENOUS
  Filled 2022-10-24: qty 20

## 2022-10-24 MED ORDER — DEXTROSE 5 % IV SOLN: Freq: Once | INTRAVENOUS | Status: AC

## 2022-10-24 MED ORDER — PALONOSETRON HCL INJECTION 0.25 MG/5ML
0.2500 mg | Freq: Once | INTRAVENOUS | Status: AC
  Administered 2022-10-24: 0.25 mg via INTRAVENOUS
  Filled 2022-10-24: qty 5

## 2022-10-24 MED ORDER — HEPARIN SOD (PORK) LOCK FLUSH 100 UNIT/ML IV SOLN: 500.0000 [IU] | Freq: Once | INTRAVENOUS | Status: DC | PRN

## 2022-10-24 MED ORDER — SODIUM CHLORIDE 0.9 % IV SOLN
150.0000 mg | Freq: Once | INTRAVENOUS | Status: AC
  Administered 2022-10-24: 150 mg via INTRAVENOUS
  Filled 2022-10-24: qty 150

## 2022-10-24 MED ORDER — SODIUM CHLORIDE 0.9 % IV SOLN
100.0000 mg/m2 | Freq: Once | INTRAVENOUS | Status: AC
  Administered 2022-10-24: 160 mg via INTRAVENOUS
  Filled 2022-10-24: qty 4.28

## 2022-10-24 MED ORDER — SODIUM CHLORIDE 0.9% FLUSH
10.0000 mL | Freq: Once | INTRAVENOUS | Status: AC
  Administered 2022-10-24: 10 mL

## 2022-10-24 MED ORDER — SODIUM CHLORIDE 0.9 % IV SOLN
10.0000 mg | Freq: Once | INTRAVENOUS | Status: AC
  Administered 2022-10-24: 10 mg via INTRAVENOUS
  Filled 2022-10-24: qty 10

## 2022-10-24 MED ORDER — SODIUM CHLORIDE 0.9% FLUSH: 10.0000 mL | INTRAVENOUS | Status: DC | PRN

## 2022-10-24 MED ORDER — ATROPINE SULFATE 1 MG/ML IV SOLN
0.5000 mg | Freq: Once | INTRAVENOUS | Status: AC | PRN
  Administered 2022-10-24: 0.5 mg via INTRAVENOUS
  Filled 2022-10-24: qty 1

## 2022-10-24 NOTE — Progress Notes (Signed)
Nutrition Follow-up:  Patient with pancreatic carcinoma metastatic to liver.  Patient is receiving folfirinox  Met with patient during infusion.  Patient eating sandwich during visit.  Reports that appetite is better. Taking marinol.  Denies stomach upset, nausea, mouth pain.  Drinking CIB mixed with Fairlife milk but not daily.      Medications: reviewed  Labs: reviewed  Anthropometrics:   Weight 120 lb 9/16 121 lb 4.8 oz on 9/4 125 lb on 8/22 123 lb on 8/7 129 lb 4.8 oz on 7/10    NUTRITION DIAGNOSIS: Unintentional weight loss stable   INTERVENTION:  Continue CIB mixed with Fairlife milk Continue to have mini meals/snacks q 2 hours as able    MONITORING, EVALUATION, GOAL: weight trends, intake   NEXT VISIT: Thursday, Oct 3rd during infusion  Juandedios Dudash B. Freida Busman, RD, LDN Registered Dietitian [phone]

## 2022-10-24 NOTE — Patient Instructions (Addendum)
[place] [facility]  Discharge Instructions: Thank you for choosing [facility] to provide your oncology and hematology care.   If you have a lab appointment with the Cancer Center, please go directly to the Cancer Center and check in at the registration area.   Wear comfortable clothing and clothing appropriate for easy access to any Portacath or PICC line.   We strive to give you quality time with your provider. You may need to reschedule your appointment if you arrive late (15 or more minutes).  Arriving late affects you and other patients whose appointments are after yours.  Also, if you miss three or more appointments without notifying the office, you may be dismissed from the clinic at the provider's discretion.      For prescription refill requests, have your pharmacy contact our office and allow 72 hours for refills to be completed.    Today you received the following chemotherapy and/or immunotherapy agents: Oxaliplatin, Leucovorin, Irinotecan and Fluorouracil.   To help prevent nausea and vomiting after your treatment, we encourage you to take your nausea medication as directed.  BELOW ARE SYMPTOMS THAT SHOULD BE REPORTED IMMEDIATELY: *FEVER GREATER THAN 100.4 F (38 C) OR HIGHER *CHILLS OR SWEATING *NAUSEA AND VOMITING THAT IS NOT CONTROLLED WITH YOUR NAUSEA MEDICATION *UNUSUAL SHORTNESS OF BREATH *UNUSUAL BRUISING OR BLEEDING *URINARY PROBLEMS (pain or burning when urinating, or frequent urination) *BOWEL PROBLEMS (unusual diarrhea, constipation, pain near the anus) TENDERNESS IN MOUTH AND THROAT WITH OR WITHOUT PRESENCE OF ULCERS (sore throat, sores in mouth, or a toothache) UNUSUAL RASH, SWELLING OR PAIN  UNUSUAL VAGINAL DISCHARGE OR ITCHING   Items with * indicate a potential emergency and should be followed up as soon as possible or go to the Emergency Department if any problems should occur.  Please show the CHEMOTHERAPY ALERT  CARD or IMMUNOTHERAPY ALERT CARD at check-in to the Emergency Department and triage nurse.  Should you have questions after your visit or need to cancel or reschedule your appointment, please contact [place] [facility]  Dept: [phone]  and follow the prompts.  Office hours are 8:00 a.m. to 4:30 p.m. Monday - Friday. Please note that voicemails left after 4:00 p.m. may not be returned until the following business day.  We are closed weekends and major holidays. You have access to a nurse at all times for urgent questions. Please call the main number to the clinic Dept: [phone] and follow the prompts.   For any non-urgent questions, you may also contact your provider using MyChart. We now offer e-Visits for anyone 41 and older to request care online for non-urgent symptoms. For details visit [email or web address].   Also download the MyChart app! Go to the app store, search "MyChart", open the app, select [facility] [place], and log in with your MyChart username and password.  The chemotherapy medication bag should finish at 46 hours, 96 hours, or 7 days. For example, if your pump is scheduled for 46 hours and it was put on at 4:00 p.m., it should finish at 2:00 p.m. the day it is scheduled to come off regardless of your appointment time.     Estimated time to finish at: 3:30 pm on Wednesday.   If the display on your pump reads "Low Volume" and it is beeping, take the batteries out of the pump and come to the cancer center for it to be taken off.   If the pump alarms go  off prior to the pump reading "Low Volume" then call [phone] and someone can assist you.  If the plunger comes out and the chemotherapy medication is leaking out, please use your home chemo spill kit to clean up the spill. Do NOT use paper towels or other household products.  If you have problems or questions regarding your pump, please call either [phone] (24 hours a day) or the cancer  center Monday-Friday 8:00 a.m.- 4:30 p.m. at the clinic number and we will assist you. If you are unable to get assistance, then go to the nearest Emergency Department and ask the staff to contact the IV team for assistance.

## 2022-10-24 NOTE — Progress Notes (Signed)
Palliative Medicine [facility]  [phone] [phone]   Name: Kaitlin Ferrell Date: 10/24/2022 MRN: [id number]  DOB: Jun 29, 1965  Patient Care Team: Angelica Chessman, MD as PCP - General (Family Medicine) Reece Packer, MD as Consulting Physician (Medical Oncology) Pickenpack-Cousar, Arty Baumgartner, NP as Nurse Practitioner (Nurse Practitioner)    INTERVAL HISTORY: Kaitlin Ferrell is a 57 y.o. 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 Other history includes gestational trophoblastic disease, insomnia, HTN, and migraines. Palliative ask to see for symptom and pain management and goals of care.   SOCIAL HISTORY:     reports that she has never smoked. She has never used smokeless tobacco. She reports current alcohol use of about 1.0 standard drink of alcohol per week. She reports that she does not use drugs.  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  MEDICATIONS:  Current Outpatient Medications  Medication Sig Dispense Refill   amLODipine (NORVASC) 10 MG tablet Take 1 tablet by mouth daily.     apixaban (ELIQUIS) 5 MG TABS tablet Take 1 tablet (5 mg total) by mouth 2 (two) times daily. 60 tablet 2   Cholecalciferol 1.25 MG (50000 UT) capsule Take 50,000 Units by mouth once a week.     Cyanocobalamin  (VITAMIN B-12 CR PO) Take by mouth.     diphenoxylate-atropine (LOMOTIL) 2.5-0.025 MG tablet Take 1 tablet by mouth 4 (four) times daily as needed for diarrhea or loose stools. 30 tablet 0   dronabinol (MARINOL) 2.5 MG capsule Take 1 capsule (2.5 mg total) by mouth 2 (two) times daily before a meal. 60 capsule 0   ELDERBERRY PO Take by mouth.     eletriptan (RELPAX) 40 MG tablet Take 1 tablet (40 mg total) by mouth as needed for migraine or headache. May repeat in 2 hours if needed. 15 tablet 6   FIBER ADULT GUMMIES PO Take by mouth.     Folic Acid (FOLATE PO) Place 1 mg under the tongue daily.     hydrochlorothiazide (MICROZIDE) 12.5 MG capsule Take 12.5 mg by mouth daily as needed (swelling).     lidocaine-prilocaine (EMLA) cream Apply 1 Application topically as needed. 30 g 2   LORazepam (ATIVAN) 0.5 MG tablet Take 0.5 mg by mouth as needed for anxiety.     magic mouthwash (multi-ingredient) oral suspension Swish and swallow 5 mLs by mouth 4 times daily as needed for throat discomfort 400 mL 1   magnesium oxide (MAG-OX) 400 (241.3 MG) MG tablet TAKE 1 TABLET BY MOUTH TWICE A DAY 60 tablet 6   morphine (MS CONTIN) 30 MG 12 hr tablet Take 1 tablet (30 mg total) by mouth every 8 (eight) hours. 90 tablet 0   morphine (MSIR) 15 MG tablet Take 1 tablet (15 mg total) by mouth every 6 (six) hours as needed for severe pain or moderate pain. 45 tablet 0  OLANZapine (ZYPREXA) 10 MG tablet TAKE 1 TABLET BY MOUTH EVERYDAY AT BEDTIME 90 tablet 1   ondansetron (ZOFRAN) 8 MG tablet Take 1 tablet (8 mg total) by mouth every 8 (eight) hours as needed for nausea or vomiting. 90 tablet 0   ondansetron (ZOFRAN-ODT) 8 MG disintegrating tablet Take 1 tablet (8 mg total) by mouth every 8 (eight) hours as needed for nausea or vomiting. 20 tablet 0   promethazine (PHENERGAN) 25 MG tablet Take 1 tablet (25 mg total) by mouth every 6 (six) hours as needed for nausea or vomiting. 30 tablet 0   scopolamine (TRANSDERM-SCOP)  1 MG/3DAYS Place 1 patch (1.5 mg total) onto the skin every 3 (three) days. 10 patch 12   No current facility-administered medications for this visit.    VITAL SIGNS: LMP 06/26/2013 Comment: low dose BCP There were no vitals filed for this visit.  Estimated body mass index is 22.19 kg/m as calculated from the following:   Height as of 08/17/22: 5\' 2"  (1.575 m).   Weight as of 10/12/22: 121 lb 4.8 oz (55 kg).   PERFORMANCE STATUS (ECOG) : 1 - Symptomatic but completely ambulatory   Physical Exam General: NAD Cardiovascular: regular rate  Pulmonary: normal breathing pattern  Abdomen: soft, nontender, + bowel sounds Extremities: no edema, no joint deformities Skin: no rashes Neurological: AAO x4  IMPRESSION:  I saw Kaitlin Ferrell during her infusion. No acute distress. Doing well overall. Denies nausea, vomiting, constipation, or diarrhea. Appetite has improved. Trying to remain as active as possible. Tolerating treatments.   Neoplasm related pain Kaitlin Ferrell reports her pain is well controlled on current regimen.    We discussed her current regimen. She is taking MS Contin 30 mg every 8 hours and MS IR as needed. Not requiring breakthrough medication at this time. Taking medications as prescribed. Given pain is controlled no changes to regimen at this time.    We will continue to closely monitor and adjust as needed.    Decreased Appetite Improving. Current weight 120 lbs stable. Was able to get Marinol 2.5 mg and is taking twice daily. Tolerating well.   Goals of Care   07/07/22- We discussed her current illness and what it means in the larger context of her on-going co-morbidities. Natural disease trajectory and expectations were discussed.   Patient and husband are realistic in their understanding of her cancer diagnosis and trajectory. She shares they have also discussed at length with her children. Patient states she chose to pursue chemotherapy break due to significance of  her symptoms. She was extremely fatigue, weak, could not get out of bed, no appetite, weight loss, nausea, vomiting, and diarrhea. [name] shares she was unable to perform ADLs and husband was having to bathe her. They were concerned that she was facing end-of-life given how poor her quality of life was. Patient and husband speaks to appreciation of her improvement in quality of life over the past several weeks.    I created space and opportunity for patient and husband to discuss goals and hopes. Patient is trying to focus on healthy food options and be as active as she can. They speak to plans of watchful waiting to see what upcoming CT scan results show in terms of her cancer. She will then consider if she would like to pursue additional treatment options versus not. They are clear in their understanding what pros and cons are of pursing or not to pursue further treatment including end-of-life. [name] speaks to her  quality of life is most important over quantity. She would not want to spend last moments in a stupor suffering state. Emotional support provided.   We discussed Her current illness and what it means in the larger context of Her on-going co-morbidities. Natural disease trajectory and expectations were discussed.  I discussed the importance of continued conversation with family and their medical providers regarding overall plan of care and treatment options, ensuring decisions are within the context of the patients values and GOCs.  PLAN:  MS Contin 15mg  every 8 hours MS IR for breakthrough pain (not requiring daily) Senna-S for bowel regimen.  Marinol 2.5mg  twice daily  I will plan to see patient back in 3-4 weeks in collaboration to other oncology appointments.   Patient expressed understanding and was in agreement with this plan. She also understands that She can call the clinic at any time with any questions, concerns, or complaints.   Any controlled substances utilized were  prescribed in the context of palliative care. PDMP has been reviewed.    Visit consisted of counseling and education dealing with the complex and emotionally intense issues of symptom management and palliative care in the setting of serious and potentially life-threatening illness.Greater than 50%  of this time was spent counseling and coordinating care related to the above assessment and plan.  Willette Alma, AGPCNP-BC  Palliative Medicine Team/[facility]  *Please note that this is a verbal dictation therefore any spelling or grammatical errors are due to the "[employer]" system interpretation.

## 2022-10-25 ENCOUNTER — Encounter: Payer: Self-pay | Admitting: Hematology and Oncology

## 2022-10-25 LAB — CANCER ANTIGEN 19-9: CA 19-9: 1155 U/mL — ABNORMAL HIGH (ref 0–35)

## 2022-10-25 NOTE — Progress Notes (Signed)
DISCONTINUE ON PATHWAY REGIMEN - Pancreatic Adenocarcinoma     A cycle is every 14 days:     Irinotecan      Oxaliplatin      Leucovorin      Fluorouracil   **Always confirm dose/schedule in your pharmacy ordering system**  REASON: Disease Progression PRIOR TREATMENT: PANOS98: mFOLFIRINOX q14 Days Until Progression or Toxicity TREATMENT RESPONSE: Partial Response (PR)  START ON PATHWAY REGIMEN - Pancreatic Adenocarcinoma     A cycle is every 28 days:     Nab-paclitaxel (protein bound)      Gemcitabine   **Always confirm dose/schedule in your pharmacy ordering system**  Patient Characteristics: Metastatic Disease, Second Line, MSS/pMMR or MSI Unknown, Fluoropyrimidine-Based Therapy First Line Therapeutic Status: Metastatic Disease Line of Therapy: Second Line Microsatellite/Mismatch Repair Status: MSS/pMMR Intent of Therapy: Non-Curative / Palliative Intent, Discussed with Patient

## 2022-10-26 ENCOUNTER — Other Ambulatory Visit: Payer: Self-pay | Admitting: Hematology and Oncology

## 2022-10-26 ENCOUNTER — Inpatient Hospital Stay: Payer: BC Managed Care – PPO

## 2022-10-26 VITALS — BP 128/88 | HR 56 | Temp 98.7°F | Resp 18

## 2022-10-26 DIAGNOSIS — Z86718 Personal history of other venous thrombosis and embolism: Secondary | ICD-10-CM | POA: Diagnosis not present

## 2022-10-26 DIAGNOSIS — D701 Agranulocytosis secondary to cancer chemotherapy: Secondary | ICD-10-CM | POA: Diagnosis not present

## 2022-10-26 DIAGNOSIS — Z95828 Presence of other vascular implants and grafts: Secondary | ICD-10-CM

## 2022-10-26 DIAGNOSIS — Z5189 Encounter for other specified aftercare: Secondary | ICD-10-CM | POA: Diagnosis not present

## 2022-10-26 DIAGNOSIS — R7989 Other specified abnormal findings of blood chemistry: Secondary | ICD-10-CM | POA: Diagnosis not present

## 2022-10-26 DIAGNOSIS — D696 Thrombocytopenia, unspecified: Secondary | ICD-10-CM | POA: Diagnosis not present

## 2022-10-26 DIAGNOSIS — Z5111 Encounter for antineoplastic chemotherapy: Secondary | ICD-10-CM | POA: Diagnosis not present

## 2022-10-26 DIAGNOSIS — Z7901 Long term (current) use of anticoagulants: Secondary | ICD-10-CM | POA: Diagnosis not present

## 2022-10-26 DIAGNOSIS — C787 Secondary malignant neoplasm of liver and intrahepatic bile duct: Secondary | ICD-10-CM | POA: Diagnosis not present

## 2022-10-26 DIAGNOSIS — T451X5A Adverse effect of antineoplastic and immunosuppressive drugs, initial encounter: Secondary | ICD-10-CM | POA: Diagnosis not present

## 2022-10-26 DIAGNOSIS — Z79899 Other long term (current) drug therapy: Secondary | ICD-10-CM | POA: Diagnosis not present

## 2022-10-26 DIAGNOSIS — C250 Malignant neoplasm of head of pancreas: Secondary | ICD-10-CM | POA: Diagnosis not present

## 2022-10-26 MED ORDER — SODIUM CHLORIDE 0.9% FLUSH
10.0000 mL | Freq: Once | INTRAVENOUS | Status: AC
  Administered 2022-10-26: 10 mL

## 2022-10-26 MED ORDER — PEGFILGRASTIM-CBQV 6 MG/0.6ML ~~LOC~~ SOSY
6.0000 mg | PREFILLED_SYRINGE | Freq: Once | SUBCUTANEOUS | Status: AC
  Administered 2022-10-26: 6 mg via SUBCUTANEOUS
  Filled 2022-10-26: qty 0.6

## 2022-10-26 MED ORDER — HEPARIN SOD (PORK) LOCK FLUSH 100 UNIT/ML IV SOLN
500.0000 [IU] | Freq: Once | INTRAVENOUS | Status: AC
  Administered 2022-10-26: 500 [IU]

## 2022-10-26 MED ORDER — SODIUM CHLORIDE 0.9% FLUSH: 10.0000 mL | Freq: Once | INTRAVENOUS | Status: DC

## 2022-10-27 ENCOUNTER — Other Ambulatory Visit: Payer: BC Managed Care – PPO

## 2022-10-27 ENCOUNTER — Ambulatory Visit: Payer: BC Managed Care – PPO

## 2022-10-27 ENCOUNTER — Other Ambulatory Visit (HOSPITAL_COMMUNITY): Payer: Self-pay

## 2022-10-27 ENCOUNTER — Ambulatory Visit: Payer: BC Managed Care – PPO | Admitting: Hematology and Oncology

## 2022-11-01 ENCOUNTER — Other Ambulatory Visit: Payer: Self-pay | Admitting: Nurse Practitioner

## 2022-11-01 DIAGNOSIS — C259 Malignant neoplasm of pancreas, unspecified: Secondary | ICD-10-CM

## 2022-11-01 DIAGNOSIS — G893 Neoplasm related pain (acute) (chronic): Secondary | ICD-10-CM

## 2022-11-01 DIAGNOSIS — Z515 Encounter for palliative care: Secondary | ICD-10-CM

## 2022-11-01 MED ORDER — MORPHINE SULFATE ER 30 MG PO TBCR: 30.0000 mg | EXTENDED_RELEASE_TABLET | Freq: Three times a day (TID) | ORAL | 0 refills | Status: DC

## 2022-11-03 ENCOUNTER — Other Ambulatory Visit: Payer: BC Managed Care – PPO

## 2022-11-03 ENCOUNTER — Ambulatory Visit: Payer: BC Managed Care – PPO

## 2022-11-03 ENCOUNTER — Ambulatory Visit: Payer: BC Managed Care – PPO | Admitting: Hematology and Oncology

## 2022-11-09 ENCOUNTER — Other Ambulatory Visit: Payer: BC Managed Care – PPO

## 2022-11-09 ENCOUNTER — Ambulatory Visit: Payer: BC Managed Care – PPO

## 2022-11-09 ENCOUNTER — Ambulatory Visit: Payer: BC Managed Care – PPO | Admitting: Hematology and Oncology

## 2022-11-10 ENCOUNTER — Inpatient Hospital Stay: Payer: BC Managed Care – PPO | Attending: Physician Assistant

## 2022-11-10 ENCOUNTER — Inpatient Hospital Stay: Payer: BC Managed Care – PPO

## 2022-11-10 ENCOUNTER — Ambulatory Visit: Payer: BC Managed Care – PPO | Admitting: Hematology and Oncology

## 2022-11-10 ENCOUNTER — Ambulatory Visit: Payer: BC Managed Care – PPO

## 2022-11-10 ENCOUNTER — Other Ambulatory Visit: Payer: BC Managed Care – PPO

## 2022-11-10 ENCOUNTER — Inpatient Hospital Stay (HOSPITAL_BASED_OUTPATIENT_CLINIC_OR_DEPARTMENT_OTHER): Payer: BC Managed Care – PPO | Admitting: Hematology and Oncology

## 2022-11-10 ENCOUNTER — Inpatient Hospital Stay: Payer: BC Managed Care – PPO | Admitting: Nutrition

## 2022-11-10 ENCOUNTER — Other Ambulatory Visit: Payer: Self-pay | Admitting: Physician Assistant

## 2022-11-10 VITALS — BP 126/86 | HR 77 | Temp 98.3°F | Resp 15 | Wt 116.5 lb

## 2022-11-10 DIAGNOSIS — M549 Dorsalgia, unspecified: Secondary | ICD-10-CM | POA: Insufficient documentation

## 2022-11-10 DIAGNOSIS — C259 Malignant neoplasm of pancreas, unspecified: Secondary | ICD-10-CM

## 2022-11-10 DIAGNOSIS — Z452 Encounter for adjustment and management of vascular access device: Secondary | ICD-10-CM | POA: Diagnosis not present

## 2022-11-10 DIAGNOSIS — I81 Portal vein thrombosis: Secondary | ICD-10-CM | POA: Diagnosis not present

## 2022-11-10 DIAGNOSIS — Z86718 Personal history of other venous thrombosis and embolism: Secondary | ICD-10-CM | POA: Diagnosis not present

## 2022-11-10 DIAGNOSIS — R112 Nausea with vomiting, unspecified: Secondary | ICD-10-CM | POA: Diagnosis not present

## 2022-11-10 DIAGNOSIS — Z7901 Long term (current) use of anticoagulants: Secondary | ICD-10-CM | POA: Diagnosis not present

## 2022-11-10 DIAGNOSIS — Z5111 Encounter for antineoplastic chemotherapy: Secondary | ICD-10-CM | POA: Diagnosis not present

## 2022-11-10 DIAGNOSIS — Z515 Encounter for palliative care: Secondary | ICD-10-CM | POA: Diagnosis not present

## 2022-11-10 DIAGNOSIS — C250 Malignant neoplasm of head of pancreas: Secondary | ICD-10-CM | POA: Insufficient documentation

## 2022-11-10 DIAGNOSIS — R630 Anorexia: Secondary | ICD-10-CM | POA: Insufficient documentation

## 2022-11-10 DIAGNOSIS — C787 Secondary malignant neoplasm of liver and intrahepatic bile duct: Secondary | ICD-10-CM | POA: Insufficient documentation

## 2022-11-10 DIAGNOSIS — G893 Neoplasm related pain (acute) (chronic): Secondary | ICD-10-CM | POA: Insufficient documentation

## 2022-11-10 DIAGNOSIS — R7989 Other specified abnormal findings of blood chemistry: Secondary | ICD-10-CM | POA: Diagnosis not present

## 2022-11-10 DIAGNOSIS — D709 Neutropenia, unspecified: Secondary | ICD-10-CM | POA: Diagnosis not present

## 2022-11-10 DIAGNOSIS — Z95828 Presence of other vascular implants and grafts: Secondary | ICD-10-CM

## 2022-11-10 LAB — CBC WITH DIFFERENTIAL (CANCER CENTER ONLY)
Abs Immature Granulocytes: 0.02 10*3/uL (ref 0.00–0.07)
Basophils Absolute: 0 10*3/uL (ref 0.0–0.1)
Basophils Relative: 0 %
Eosinophils Absolute: 0.1 10*3/uL (ref 0.0–0.5)
Eosinophils Relative: 1 %
HCT: 30.2 % — ABNORMAL LOW (ref 36.0–46.0)
Hemoglobin: 9.5 g/dL — ABNORMAL LOW (ref 12.0–15.0)
Immature Granulocytes: 0 %
Lymphocytes Relative: 32 %
Lymphs Abs: 1.8 10*3/uL (ref 0.7–4.0)
MCH: 30.3 pg (ref 26.0–34.0)
MCHC: 31.5 g/dL (ref 30.0–36.0)
MCV: 96.2 fL (ref 80.0–100.0)
Monocytes Absolute: 0.9 10*3/uL (ref 0.1–1.0)
Monocytes Relative: 16 %
Neutro Abs: 2.8 10*3/uL (ref 1.7–7.7)
Neutrophils Relative %: 51 %
Platelet Count: 73 10*3/uL — ABNORMAL LOW (ref 150–400)
RBC: 3.14 MIL/uL — ABNORMAL LOW (ref 3.87–5.11)
RDW: 17.3 % — ABNORMAL HIGH (ref 11.5–15.5)
WBC Count: 5.6 10*3/uL (ref 4.0–10.5)
nRBC: 0 % (ref 0.0–0.2)

## 2022-11-10 LAB — CMP (CANCER CENTER ONLY)
ALT: 12 U/L (ref 0–44)
AST: 21 U/L (ref 15–41)
Albumin: 3.9 g/dL (ref 3.5–5.0)
Alkaline Phosphatase: 117 U/L (ref 38–126)
Anion gap: 7 (ref 5–15)
BUN: 9 mg/dL (ref 6–20)
CO2: 28 mmol/L (ref 22–32)
Calcium: 9.6 mg/dL (ref 8.9–10.3)
Chloride: 105 mmol/L (ref 98–111)
Creatinine: 0.56 mg/dL (ref 0.44–1.00)
GFR, Estimated: >60 mL/min (ref >60)
Glucose, Bld: 95 mg/dL (ref 70–99)
Potassium: 3.5 mmol/L (ref 3.5–5.1)
Sodium: 140 mmol/L (ref 135–145)
Total Bilirubin: 0.4 mg/dL (ref 0.3–1.2)
Total Protein: 7.1 g/dL (ref 6.5–8.1)

## 2022-11-10 MED ORDER — HEPARIN SOD (PORK) LOCK FLUSH 100 UNIT/ML IV SOLN
500.0000 [IU] | Freq: Once | INTRAVENOUS | Status: AC | PRN
  Administered 2022-11-10: 500 [IU]

## 2022-11-10 MED ORDER — ONDANSETRON HCL 4 MG/2ML IJ SOLN
8.0000 mg | Freq: Once | INTRAMUSCULAR | Status: AC
  Administered 2022-11-10: 8 mg via INTRAVENOUS
  Filled 2022-11-10: qty 4

## 2022-11-10 MED ORDER — SODIUM CHLORIDE 0.9% FLUSH
10.0000 mL | Freq: Once | INTRAVENOUS | Status: AC
  Administered 2022-11-10: 10 mL

## 2022-11-10 MED ORDER — SODIUM CHLORIDE 0.9% FLUSH
10.0000 mL | INTRAVENOUS | Status: DC | PRN
  Administered 2022-11-10: 10 mL

## 2022-11-10 MED ORDER — SODIUM CHLORIDE 0.9 % IV SOLN: Freq: Once | INTRAVENOUS | Status: AC

## 2022-11-10 MED ORDER — SODIUM CHLORIDE 0.9 % IV SOLN
1000.0000 mg/m2 | Freq: Once | INTRAVENOUS | Status: AC
  Administered 2022-11-10: 1558 mg via INTRAVENOUS
  Filled 2022-11-10: qty 40.98

## 2022-11-10 MED ORDER — PACLITAXEL PROTEIN-BOUND CHEMO INJECTION 100 MG
125.0000 mg/m2 | Freq: Once | INTRAVENOUS | Status: AC
  Administered 2022-11-10: 200 mg via INTRAVENOUS
  Filled 2022-11-10: qty 40

## 2022-11-10 NOTE — Progress Notes (Signed)
Kaitlin Ferrell [phone]   [phone]  PROGRESS NOTE:  Patient Care Team: Kaitlin Hoit, MD as PCP - General (Family Medicine) Kaitlin Packer, MD as Consulting Physician (Medical Oncology) Kaitlin [facility]rell, Kaitlin Baumgartner, NP as Nurse Practitioner (Nurse Practitioner)  CHIEF COMPLAINTS/PURPOSE OF CONSULTATION:  Metastatic pancreatic adenocarcinoma involving the liver  ONCOLOGIC HISTORY: Presented with nausea and right upper quadrant pain 03/08/2022: Abdominal US showed multiple hypoechoic masses within the liver are indeterminate.  03/10/2022: MR abdomen: Hypoenhancing mass compatible with pancreatic head adenocarcinoma with numerous targetoid enhancing masses in all segments of the liver, compatible with metastatic disease. The mass severely effaces and nearly occludes the portal vein and splenic vein, and occludes the superior mesenteric vein with some collaterals noted. The mass substantially abuts the proximal transverse duodenum, although overt duodenal invasion is indeterminate. 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 Prominent stool throughout the colon favors constipation. Fluid-fluid level in the gallbladder likely from sludge. 03/14/2022: Establish care with Kaitlin Ferrell Hematology/Oncology 03/17/2022: CT chest: no definitive evidence of lung metastases.  03/22/2022: Liver biopsy confirmed metastatic adenocarcinoma, pancreatic primary.  03/30/2022: Cycle 1, Day 1 of mFOLFIRINOX 04/14/2022: Cycle 2, Day 1 of mFOLFIRINOX HELD due to ANC 300.  04/20/2022: Cycle 2, Day 1 of mFOLFIRINOX 05/04/2022: Cycle 3, Day 1 of  mFOLFIRINOX 05/19/2022: Cycle 4, Day 1 of mFOLFIRINOX HELD per patient request.  07/21/2022: Cycle 4, Day 1 of mFOLFIRINOX 08/04/2022: Cycle 5, Day 1 of mFOLFIRINOX HELD due to Plt 81K 08/17/2022: Cycle 5, Day 1 of mFOLFIRINOX 09/01/2022: Cycle 6, Day 1 of mFOLFIRINOX 09/14/2022: Cycle 7, Day 1 of mFOLFIRINOX HELD due to Plt 31K 09/22/2022: Cycle 7, Day 1 of mFOLFIRINOX HELD due to Plt 73K 10/20/2022 CT C/A/P showed mixed picture with progression of disease. FOLFIRINOX d/c.  11/10/2022: Cycle 1 Day 1 of Gemcitabine/Abraxane.    HISTORY OF PRESENTING ILLNESS:  Kaitlin Ferrell presents today for a follow up. She was last seen on 10/24/2022.  Patient presents today prior to Cycle 1 Day 1 of Gem/Abraxane.   [name] reports her appetite and energy levels have improved.  She reports that she feels like she can do more things and is able to do more housework.  Her weight has dropped to 116 pounds, and is continuing to fall.  She is down 4 pounds from our last visit.  She feels like she did not eat well yesterday.  She notes that she is not having any issues with nausea, vomiting, or diarrhea.  She is not having any infectious symptoms such as runny nose, cough, fevers, chills, sweats.  She is having a bit of sore throat and some occasional gum bleeding.  Overall she is willing and able to proceed with treatment today.  She denies fevers, chills, sweats, shortness of breath, chest pain or cough. She has no other complaints. Rest of the 10 point ROS is below.   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   treated with 4 cycles of actinomycin D from  12-02-11 thru 01-13-12   H/O molar pregnancy, antepartum    Hypertension    Migraine    Miscarriage     SURGICAL HISTORY: Past Surgical History:   Procedure Laterality Date   BREAST BIOPSY  2001   Left   BREAST LUMPECTOMY     DILATION AND CURETTAGE OF UTERUS  1992, 09/2011   DILATION AND EVACUATION  2011   INSERTION OF MESH N/A 09/03/2013   Procedure: INSERTION OF MESH;  Surgeon: Kaitlin Sportsman, MD;  Location: [facility];  Service: General;  Laterality: N/A;   IR CHEST FLUORO  04/14/2022   IR IMAGING GUIDED PORT INSERTION  03/22/2022   IR IMAGING GUIDED PORT INSERTION  05/02/2022   IR PORT REPAIR CENTRAL VENOUS ACCESS DEVICE  04/19/2022   IR REMOVAL TUN CV CATH W/O FL  05/02/2022   IR US LIVER BIOPSY  03/22/2022   PORTACATH PLACEMENT  11/2011   PORTACATH PLACEMENT     REMOVAL  MARCH 2014   VENTRAL HERNIA REPAIR N/A 09/03/2013   Procedure: LAPAROSCOPIC VENTRAL WALL HERNIA REPAIR;  Surgeon: Kaitlin Sportsman, MD;  Location: [facility];  Service: General;  Laterality: N/A;   WISDOM TOOTH EXTRACTION     in 11th grade    SOCIAL HISTORY: Social History   Socioeconomic History   Marital status: Married    Spouse name: Kaitlin Ferrell   Number of children: 2   Years of education: college   Highest education level: Not on file  Occupational History    Comment: C[employer]  Tobacco Use   Smoking status: Never   Smokeless tobacco: Never  Substance and Sexual Activity   Alcohol use: Yes    Alcohol/week: 1.0 standard drink of alcohol    Types: 1 Glasses of wine per week    Comment: occas   Drug use: No   Sexual activity: Not Currently    Birth control/protection: Pill  Other Topics Concern   Not on file  Social History Narrative   Patient is married Kaitlin Ferrell). Patient works part time at [employer].   Right handed.   Patient has her CMA.   Caffeine- None         Social Determinants of Health   Financial Resource Strain: Not on file  Food Insecurity: No Food Insecurity (05/10/2022)   Hunger Vital Sign    Worried About Running Out of Food in the Last Year: Never true    Ran Out of Food in the Last Year: Never true  Transportation Needs: No  Transportation Needs (05/10/2022)   PRAPARE - Administrator, Civil Service (Medical): No    Lack of Transportation (Non-Medical): No  Physical Activity: Not on file  Stress: Not on file  Social Connections: Not on file  Intimate Partner Violence: Not At Risk (05/10/2022)   Humiliation, Afraid, Rape, and Kick questionnaire    Fear of Current or Ex-Partner: No    Emotionally Abused: No    Physically Abused: No    Sexually Abused: No    FAMILY HISTORY: Family History  Problem Relation Age of Onset   Breast cancer Mother        dx > 50   Heart attack Mother    Diabetes Father    Breast cancer Maternal Aunt        dx > 50   Breast cancer Maternal Aunt        dx > 50   Breast cancer Maternal Grandmother        ? <  50   Stroke Paternal Grandmother    Alcoholism Other     ALLERGIES:  is allergic to compazine [prochlorperazine edisylate], propofol, oxycodone-acetaminophen, prochlorperazine, and labetalol.  MEDICATIONS:  Current Outpatient Medications  Medication Sig Dispense Refill   amLODipine (NORVASC) 10 MG tablet Take 1 tablet by mouth daily.     apixaban (ELIQUIS) 5 MG TABS tablet Take 1 tablet (5 mg total) by mouth 2 (two) times daily. 60 tablet 2   Cholecalciferol 1.25 MG (50000 UT) capsule Take 50,000 Units by mouth once a week.     Cyanocobalamin (VITAMIN B-12 CR PO) Take by mouth.     diphenoxylate-atropine (LOMOTIL) 2.5-0.025 MG tablet Take 1 tablet by mouth 4 (four) times daily as needed for diarrhea or loose stools. 30 tablet 0   dronabinol (MARINOL) 2.5 MG capsule Take 1 capsule (2.5 mg total) by mouth 2 (two) times daily before a meal. 60 capsule 0   ELDERBERRY PO Take by mouth.     eletriptan (RELPAX) 40 MG tablet Take 1 tablet (40 mg total) by mouth as needed for migraine or headache. May repeat in 2 hours if needed. 15 tablet 6   FIBER ADULT GUMMIES PO Take by mouth.     Folic Acid (FOLATE PO) Place 1 mg under the tongue daily.     hydrochlorothiazide  (MICROZIDE) 12.5 MG capsule Take 12.5 mg by mouth daily as needed (swelling).     lidocaine-prilocaine (EMLA) cream Apply 1 Application topically as needed. 30 g 2   LORazepam (ATIVAN) 0.5 MG tablet Take 0.5 mg by mouth as needed for anxiety.     magic mouthwash (multi-ingredient) oral suspension Swish and swallow 5 mLs by mouth 4 times daily as needed for throat discomfort 400 mL 1   magnesium oxide (MAG-OX) 400 (241.3 MG) MG tablet TAKE 1 TABLET BY MOUTH TWICE A DAY 60 tablet 6   morphine (MS CONTIN) 30 MG 12 hr tablet Take 1 tablet (30 mg total) by mouth every 8 (eight) hours. 90 tablet 0   morphine (MSIR) 15 MG tablet Take 1 tablet (15 mg total) by mouth every 6 (six) hours as needed for severe pain or moderate pain. 45 tablet 0   OLANZapine (ZYPREXA) 10 MG tablet TAKE 1 TABLET BY MOUTH EVERYDAY AT BEDTIME 90 tablet 1   ondansetron (ZOFRAN) 8 MG tablet Take 1 tablet (8 mg total) by mouth every 8 (eight) hours as needed for nausea or vomiting. 90 tablet 0   ondansetron (ZOFRAN-ODT) 8 MG disintegrating tablet Take 1 tablet (8 mg total) by mouth every 8 (eight) hours as needed for nausea or vomiting. 20 tablet 0   promethazine (PHENERGAN) 25 MG tablet Take 1 tablet (25 mg total) by mouth every 6 (six) hours as needed for nausea or vomiting. 30 tablet 0   scopolamine (TRANSDERM-SCOP) 1 MG/3DAYS Place 1 patch (1.5 mg total) onto the skin every 3 (three) days. 10 patch 12   No current facility-administered medications for this visit.    REVIEW OF SYSTEMS:   Constitutional: ( - ) fevers, ( - )  chills , ( - ) night sweats Eyes: ( - ) blurriness of vision, ( - ) double vision, ( - ) watery eyes Ears, nose, mouth, throat, and face: ( - ) mucositis, ( - ) sore throat Respiratory: ( - ) cough, ( - ) dyspnea, ( - ) wheezes Cardiovascular: ( - ) palpitation, ( - ) chest discomfort, ( - ) lower extremity swelling Gastrointestinal:  (+) nausea, ( - )  heartburn, ( -) change in bowel habits Skin: ( - )  abnormal skin rashes Lymphatics: ( - ) new lymphadenopathy, ( + ) easy bruising Neurological: ( - ) numbness, ( - ) tingling, ( - ) new weaknesses Behavioral/Psych: ( - ) mood change, ( - ) new changes  All other systems were reviewed with the patient and are negative.  PHYSICAL EXAMINATION: ECOG PERFORMANCE STATUS: 1 - Symptomatic but completely ambulatory  Vitals:   11/10/22 1018  BP: 126/86  Pulse: 77  Resp: 15  Temp: 98.3 F (36.8 C)  SpO2: 100%      Filed Weights   11/10/22 1018  Weight: 116 lb 8 oz (52.8 kg)     GENERAL: well appearing female in NAD  SKIN: skin color, texture, turgor are normal, no rashes or significant lesions. Mild bruising with nodularity involving bilateral upper and lower extremities.  EYES: conjunctiva are pink and non-injected, sclera clear LUNGS: clear to auscultation and percussion with normal breathing effort HEART: regular rate & rhythm and no murmurs and no lower extremity edema Musculoskeletal: no cyanosis of digits and no clubbing  PSYCH: alert & oriented x 3, fluent speech NEURO: no focal motor/sensory deficits  LABORATORY DATA:  I have reviewed the data as listed    Latest Ref Rng & Units 11/21/2022   10:05 AM 11/18/2022    9:45 AM 11/10/2022    9:34 AM  CBC  WBC 4.0 - 10.5 K/uL 2.8  3.3  5.6   Hemoglobin 12.0 - 15.0 g/dL 8.3  8.9  9.5   Hematocrit 36.0 - 46.0 % 25.9  27.2  30.2   Platelets 150 - 400 K/uL 22  13  73        Latest Ref Rng & Units 11/21/2022   10:05 AM 11/18/2022    9:45 AM 11/10/2022    9:34 AM  CMP  Glucose 70 - 99 mg/dL 161  096  95   BUN 6 - 20 mg/dL 15  14  9    Creatinine 0.44 - 1.00 mg/dL 0.45  4.09  8.11   Sodium 135 - 145 mmol/L 140  138  140   Potassium 3.5 - 5.1 mmol/L 3.6  3.5  3.5   Chloride 98 - 111 mmol/L 107  104  105   CO2 22 - 32 mmol/L 28  29  28    Calcium 8.9 - 10.3 mg/dL 9.4  9.7  9.6   Total Protein 6.5 - 8.1 g/dL 7.0  7.4  7.1   Total Bilirubin 0.3 - 1.2 mg/dL 0.3  0.4  0.4    Alkaline Phos 38 - 126 U/L 95  100  117   AST 15 - 41 U/L 29  41  21   ALT 0 - 44 U/L 22  27  12       RADIOGRAPHIC STUDIES: I have personally reviewed the radiological images as listed and agreed with the findings in the report. No results found.  ASSESSMENT & PLAN Kaitlin Ferrell is a 57 y.o. female who returns to the clinic for newly diagnosed pancreatic adenocarcinoma.    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 Dr. Leonides Schanz recommends chemotherapy regimen with FOLFIRINOX q 2 weeks.  --Plan to obtain restaging CT scans every 3 months to assess treatment response.  --[facility] Testing: MS-stable, 2 Muts/Mb,  ATMH64fs*31, KRASG12L, MDM2 amplification, CDKN2A/B. No targetable mutations.  --Started mFOLFIRINOX on 03/30/2022 --After Cycle 3 on 05/04/2022, patient was on a treatment break due to poor tolerance and patient's request. --Most recent CT CAP from 06/30/2022 showed treatment response. --Patient resumed FOLFIRINOX without dose modification on 07/21/2022  --progression noted on CT scan from 10/20/2022, d/c FOLFIRINOX.  PLAN: --Due for Cycle 1, Day 1 Gem/Abraxane --Labs from today reviewed with patient.  White blood cell 5.6, hemoglobin 9.5, MCV 96.2, and platelets of 73 --Will proceed with chemotherapy today. Strict precautions given for bleeding.  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  --Currently on Eliquis 5 mg  BID  #Neutropenia --Added Udenyca on Day 3 with Cycle 2.  --Neutropenic precautions given to patient including monitoring for fevers.   #Elevated LFTs --Most likely secondary to chemotherapy. AST 29, ALT 22, bilirubin within normal limits  --Monitor for now  #Nausea/Vomiting: --Secondary to chemotherapy and well controlled.  -- Continue to take scopolamine patch, phenergan 25 mg q 6 hours, zofran 8mg  q8H PRN for nausea, zyprexa nightly PRN for nausea.   #Epigastric/RUQ/back pain: --Likely secondary to underlying malignancy --Tramadol was ineffective.  --Current pain regimen includes MS contin to 30 mg q 8 hours and Norco 10-325 mg.   #Appetite loss/weight loss: --Weight has improved since last week from 123 lbs to 127 lbs.  --Sent prescription for Marinol 2.5 mg BID, awaiting prior auth --Encouraged to eat small, frequent meals and supplement with protein shakes --Follows with [facility] nutrition team  #Family history of breast cancer: --Due to strong family history of cancer and has undergone genetic testing in the past.  --Underwent genetic testing on 04/07/2022 that was negative. 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   #Supportive Care -- chemotherapy education to be scheduled  -- port placement complete  -- EMLA cream for port  Orders Placed This Encounter  Procedures   Consent Attestation for Oncology Treatment    Order Specific Question:   The patient is informed of risks, benefits, side-effects of the prescribed oncology treatment. Potential short term and long term side effects and response rates discussed. After a long discussion, the patient made informed decision to proceed.    Answer:   Yes   All questions were answered. The patient knows to call the clinic with any problems, questions or concerns.  I have spent a total of 30 minutes minutes of face-to-face and non-face-to-face time, preparing to see the patient,  performing a medically  appropriate examination, counseling and educating the patient, documenting clinical information in the electronic health record,  and care coordination.   Ulysees Barns, MD Department of Hematology/Oncology [facility] Phone: [phone] Pager: [phone] Email: [email or web address]

## 2022-11-10 NOTE — Patient Instructions (Signed)
[facility]  Discharge Instructions: Thank you for choosing [facility] to provide your oncology and hematology care.   If you have a lab appointment with the Cancer Center, please go directly to the Cancer Center and check in at the registration area.   Wear comfortable clothing and clothing appropriate for easy access to any Portacath or PICC line.   We strive to give you quality time with your provider. You may need to reschedule your appointment if you arrive late (15 or more minutes).  Arriving late affects you and other patients whose appointments are after yours.  Also, if you miss three or more appointments without notifying the office, you may be dismissed from the clinic at the provider's discretion.      For prescription refill requests, have your pharmacy contact our office and allow 72 hours for refills to be completed.    Today you received the following chemotherapy and/or immunotherapy agents Abraxane, Gemzar      To help prevent nausea and vomiting after your treatment, we encourage you to take your nausea medication as directed.  BELOW ARE SYMPTOMS THAT SHOULD BE REPORTED IMMEDIATELY: *FEVER GREATER THAN 100.4 F (38 C) OR HIGHER *CHILLS OR SWEATING *NAUSEA AND VOMITING THAT IS NOT CONTROLLED WITH YOUR NAUSEA MEDICATION *UNUSUAL SHORTNESS OF BREATH *UNUSUAL BRUISING OR BLEEDING *URINARY PROBLEMS (pain or burning when urinating, or frequent urination) *BOWEL PROBLEMS (unusual diarrhea, constipation, pain near the anus) TENDERNESS IN MOUTH AND THROAT WITH OR WITHOUT PRESENCE OF ULCERS (sore throat, sores in mouth, or a toothache) UNUSUAL RASH, SWELLING OR PAIN  UNUSUAL VAGINAL DISCHARGE OR ITCHING   Items with * indicate a potential emergency and should be followed up as soon as possible or go to the Emergency Department if any problems should occur.  Please show the CHEMOTHERAPY ALERT CARD or IMMUNOTHERAPY ALERT CARD at  check-in to the Emergency Department and triage nurse.  Should you have questions after your visit or need to cancel or reschedule your appointment, please contact [facility]  Dept: [phone]  and follow the prompts.  Office hours are 8:00 a.m. to 4:30 p.m. Monday - Friday. Please note that voicemails left after 4:00 p.m. may not be returned until the following business day.  We are closed weekends and major holidays. You have access to a nurse at all times for urgent questions. Please call the main number to the clinic Dept: [phone] and follow the prompts.   For any non-urgent questions, you may also contact your provider using MyChart. We now offer e-Visits for anyone 18 and older to request care online for non-urgent symptoms. For details visit [email or web address].   Also download the MyChart app! Go to the app store, search "MyChart", open the app, select [facility], and log in with your MyChart username and password.   

## 2022-11-10 NOTE — Progress Notes (Signed)
Ok to tx with low plts per dr Leonides Schanz

## 2022-11-10 NOTE — Progress Notes (Signed)
Nutrition follow-up completed with patient during infusion for metastatic pancreas cancer.  Patient is receiving FOLFIRINOX.  She is followed by Dr. Leonides Schanz.  Weight trending down and was documented as 116 pounds 8 ounces October 3.  This is decreased since 120 pounds on September 16.  Labs reviewed.  Patient reports she has occasional nausea and diarrhea.  She takes medications as prescribed and reports relief.  She feels her appetite is pretty good.  She does not like commercial oral nutrition supplements but has tried fair life milk mixed with Carnation breakfast essentials.  She does enjoy this and is able to tolerate.  States she has not been drinking these lately however is open to resuming.  Patient does not have questions at this time.  Nutrition diagnosis: Unintentional weight loss, ongoing.  Intervention: Continue Carnation breakfast essentials mixed with fair life milk.  Change to whole milk instead of low-fat/skim milk to add calories. Reviewed other foods that could add calories/protein throughout the day. Continue small amounts often. Continue medications as prescribed.   Provided contact information.  Monitoring, evaluation, goals: Patient will tolerate increased calories and protein to minimize further weight loss.  Next visit: To be scheduled with upcoming treatment as needed.  Patient has RD contact information for questions.  **Disclaimer: This note was dictated with voice recognition software. Similar sounding words can inadvertently be transcribed and this note may contain transcription errors which may not have been corrected upon publication of note.**

## 2022-11-11 ENCOUNTER — Other Ambulatory Visit (HOSPITAL_COMMUNITY): Payer: Self-pay

## 2022-11-17 ENCOUNTER — Other Ambulatory Visit: Payer: BC Managed Care – PPO

## 2022-11-17 ENCOUNTER — Ambulatory Visit: Payer: BC Managed Care – PPO

## 2022-11-17 ENCOUNTER — Ambulatory Visit: Payer: BC Managed Care – PPO | Admitting: Hematology and Oncology

## 2022-11-18 ENCOUNTER — Inpatient Hospital Stay: Payer: BC Managed Care – PPO

## 2022-11-18 DIAGNOSIS — C787 Secondary malignant neoplasm of liver and intrahepatic bile duct: Secondary | ICD-10-CM | POA: Diagnosis not present

## 2022-11-18 DIAGNOSIS — R112 Nausea with vomiting, unspecified: Secondary | ICD-10-CM | POA: Diagnosis not present

## 2022-11-18 DIAGNOSIS — R7989 Other specified abnormal findings of blood chemistry: Secondary | ICD-10-CM | POA: Diagnosis not present

## 2022-11-18 DIAGNOSIS — Z452 Encounter for adjustment and management of vascular access device: Secondary | ICD-10-CM | POA: Diagnosis not present

## 2022-11-18 DIAGNOSIS — C250 Malignant neoplasm of head of pancreas: Secondary | ICD-10-CM | POA: Diagnosis not present

## 2022-11-18 DIAGNOSIS — I81 Portal vein thrombosis: Secondary | ICD-10-CM | POA: Diagnosis not present

## 2022-11-18 DIAGNOSIS — Z86718 Personal history of other venous thrombosis and embolism: Secondary | ICD-10-CM | POA: Diagnosis not present

## 2022-11-18 DIAGNOSIS — G893 Neoplasm related pain (acute) (chronic): Secondary | ICD-10-CM | POA: Diagnosis not present

## 2022-11-18 DIAGNOSIS — M549 Dorsalgia, unspecified: Secondary | ICD-10-CM | POA: Diagnosis not present

## 2022-11-18 DIAGNOSIS — D709 Neutropenia, unspecified: Secondary | ICD-10-CM | POA: Diagnosis not present

## 2022-11-18 DIAGNOSIS — Z5111 Encounter for antineoplastic chemotherapy: Secondary | ICD-10-CM | POA: Diagnosis not present

## 2022-11-18 DIAGNOSIS — Z515 Encounter for palliative care: Secondary | ICD-10-CM | POA: Diagnosis not present

## 2022-11-18 DIAGNOSIS — Z95828 Presence of other vascular implants and grafts: Secondary | ICD-10-CM

## 2022-11-18 DIAGNOSIS — C259 Malignant neoplasm of pancreas, unspecified: Secondary | ICD-10-CM

## 2022-11-18 DIAGNOSIS — Z7901 Long term (current) use of anticoagulants: Secondary | ICD-10-CM | POA: Diagnosis not present

## 2022-11-18 DIAGNOSIS — R630 Anorexia: Secondary | ICD-10-CM | POA: Diagnosis not present

## 2022-11-18 LAB — CMP (CANCER CENTER ONLY)
ALT: 27 U/L (ref 0–44)
AST: 41 U/L (ref 15–41)
Albumin: 4.1 g/dL (ref 3.5–5.0)
Alkaline Phosphatase: 100 U/L (ref 38–126)
Anion gap: 5 (ref 5–15)
BUN: 14 mg/dL (ref 6–20)
CO2: 29 mmol/L (ref 22–32)
Calcium: 9.7 mg/dL (ref 8.9–10.3)
Chloride: 104 mmol/L (ref 98–111)
Creatinine: 0.56 mg/dL (ref 0.44–1.00)
GFR, Estimated: >60 mL/min (ref >60)
Glucose, Bld: 105 mg/dL — ABNORMAL HIGH (ref 70–99)
Potassium: 3.5 mmol/L (ref 3.5–5.1)
Sodium: 138 mmol/L (ref 135–145)
Total Bilirubin: 0.4 mg/dL (ref 0.3–1.2)
Total Protein: 7.4 g/dL (ref 6.5–8.1)

## 2022-11-18 LAB — CBC WITH DIFFERENTIAL (CANCER CENTER ONLY)
Abs Immature Granulocytes: 0.01 10*3/uL (ref 0.00–0.07)
Basophils Absolute: 0 10*3/uL (ref 0.0–0.1)
Basophils Relative: 1 %
Eosinophils Absolute: 0 10*3/uL (ref 0.0–0.5)
Eosinophils Relative: 1 %
HCT: 27.2 % — ABNORMAL LOW (ref 36.0–46.0)
Hemoglobin: 8.9 g/dL — ABNORMAL LOW (ref 12.0–15.0)
Immature Granulocytes: 0 %
Lymphocytes Relative: 62 %
Lymphs Abs: 2.1 10*3/uL (ref 0.7–4.0)
MCH: 30.8 pg (ref 26.0–34.0)
MCHC: 32.7 g/dL (ref 30.0–36.0)
MCV: 94.1 fL (ref 80.0–100.0)
Monocytes Absolute: 0.2 10*3/uL (ref 0.1–1.0)
Monocytes Relative: 5 %
Neutro Abs: 1 10*3/uL — ABNORMAL LOW (ref 1.7–7.7)
Neutrophils Relative %: 31 %
Platelet Count: 13 10*3/uL — ABNORMAL LOW (ref 150–400)
RBC: 2.89 MIL/uL — ABNORMAL LOW (ref 3.87–5.11)
RDW: 15.1 % (ref 11.5–15.5)
WBC Count: 3.3 10*3/uL — ABNORMAL LOW (ref 4.0–10.5)
nRBC: 0 % (ref 0.0–0.2)

## 2022-11-18 MED ORDER — SODIUM CHLORIDE 0.9% FLUSH
10.0000 mL | Freq: Once | INTRAVENOUS | Status: AC
  Administered 2022-11-18: 10 mL

## 2022-11-18 MED ORDER — HEPARIN SOD (PORK) LOCK FLUSH 100 UNIT/ML IV SOLN
500.0000 [IU] | Freq: Once | INTRAVENOUS | Status: AC
  Administered 2022-11-18: 500 [IU]

## 2022-11-18 NOTE — Addendum Note (Signed)
Addended by: Jory Sims B on: 11/18/2022 11:02 AM   Modules accepted: Orders

## 2022-11-21 ENCOUNTER — Encounter: Payer: Self-pay | Admitting: Hematology and Oncology

## 2022-11-21 ENCOUNTER — Inpatient Hospital Stay: Payer: BC Managed Care – PPO

## 2022-11-21 ENCOUNTER — Other Ambulatory Visit: Payer: Self-pay | Admitting: Hematology and Oncology

## 2022-11-21 DIAGNOSIS — M549 Dorsalgia, unspecified: Secondary | ICD-10-CM | POA: Diagnosis not present

## 2022-11-21 DIAGNOSIS — R112 Nausea with vomiting, unspecified: Secondary | ICD-10-CM | POA: Diagnosis not present

## 2022-11-21 DIAGNOSIS — R7989 Other specified abnormal findings of blood chemistry: Secondary | ICD-10-CM | POA: Diagnosis not present

## 2022-11-21 DIAGNOSIS — C250 Malignant neoplasm of head of pancreas: Secondary | ICD-10-CM | POA: Diagnosis not present

## 2022-11-21 DIAGNOSIS — I81 Portal vein thrombosis: Secondary | ICD-10-CM | POA: Diagnosis not present

## 2022-11-21 DIAGNOSIS — Z7901 Long term (current) use of anticoagulants: Secondary | ICD-10-CM | POA: Diagnosis not present

## 2022-11-21 DIAGNOSIS — Z5111 Encounter for antineoplastic chemotherapy: Secondary | ICD-10-CM | POA: Diagnosis not present

## 2022-11-21 DIAGNOSIS — R630 Anorexia: Secondary | ICD-10-CM | POA: Diagnosis not present

## 2022-11-21 DIAGNOSIS — D709 Neutropenia, unspecified: Secondary | ICD-10-CM | POA: Diagnosis not present

## 2022-11-21 DIAGNOSIS — C259 Malignant neoplasm of pancreas, unspecified: Secondary | ICD-10-CM

## 2022-11-21 DIAGNOSIS — Z452 Encounter for adjustment and management of vascular access device: Secondary | ICD-10-CM | POA: Diagnosis not present

## 2022-11-21 DIAGNOSIS — G893 Neoplasm related pain (acute) (chronic): Secondary | ICD-10-CM | POA: Diagnosis not present

## 2022-11-21 DIAGNOSIS — Z95828 Presence of other vascular implants and grafts: Secondary | ICD-10-CM

## 2022-11-21 DIAGNOSIS — C787 Secondary malignant neoplasm of liver and intrahepatic bile duct: Secondary | ICD-10-CM | POA: Diagnosis not present

## 2022-11-21 DIAGNOSIS — Z515 Encounter for palliative care: Secondary | ICD-10-CM | POA: Diagnosis not present

## 2022-11-21 DIAGNOSIS — Z86718 Personal history of other venous thrombosis and embolism: Secondary | ICD-10-CM | POA: Diagnosis not present

## 2022-11-21 LAB — CBC WITH DIFFERENTIAL (CANCER CENTER ONLY)
Abs Immature Granulocytes: 0.01 10*3/uL (ref 0.00–0.07)
Basophils Absolute: 0 10*3/uL (ref 0.0–0.1)
Basophils Relative: 0 %
Eosinophils Absolute: 0 10*3/uL (ref 0.0–0.5)
Eosinophils Relative: 1 %
HCT: 25.9 % — ABNORMAL LOW (ref 36.0–46.0)
Hemoglobin: 8.3 g/dL — ABNORMAL LOW (ref 12.0–15.0)
Immature Granulocytes: 0 %
Lymphocytes Relative: 61 %
Lymphs Abs: 1.7 10*3/uL (ref 0.7–4.0)
MCH: 30.6 pg (ref 26.0–34.0)
MCHC: 32 g/dL (ref 30.0–36.0)
MCV: 95.6 fL (ref 80.0–100.0)
Monocytes Absolute: 0.3 10*3/uL (ref 0.1–1.0)
Monocytes Relative: 10 %
Neutro Abs: 0.8 10*3/uL — ABNORMAL LOW (ref 1.7–7.7)
Neutrophils Relative %: 28 %
Platelet Count: 22 10*3/uL — ABNORMAL LOW (ref 150–400)
RBC: 2.71 MIL/uL — ABNORMAL LOW (ref 3.87–5.11)
RDW: 15.5 % (ref 11.5–15.5)
WBC Count: 2.8 10*3/uL — ABNORMAL LOW (ref 4.0–10.5)
nRBC: 0 % (ref 0.0–0.2)

## 2022-11-21 LAB — CMP (CANCER CENTER ONLY)
ALT: 22 U/L (ref 0–44)
AST: 29 U/L (ref 15–41)
Albumin: 3.8 g/dL (ref 3.5–5.0)
Alkaline Phosphatase: 95 U/L (ref 38–126)
Anion gap: 5 (ref 5–15)
BUN: 15 mg/dL (ref 6–20)
CO2: 28 mmol/L (ref 22–32)
Calcium: 9.4 mg/dL (ref 8.9–10.3)
Chloride: 107 mmol/L (ref 98–111)
Creatinine: 0.61 mg/dL (ref 0.44–1.00)
GFR, Estimated: >60 mL/min (ref >60)
Glucose, Bld: 125 mg/dL — ABNORMAL HIGH (ref 70–99)
Potassium: 3.6 mmol/L (ref 3.5–5.1)
Sodium: 140 mmol/L (ref 135–145)
Total Bilirubin: 0.3 mg/dL (ref 0.3–1.2)
Total Protein: 7 g/dL (ref 6.5–8.1)

## 2022-11-21 MED ORDER — HEPARIN SOD (PORK) LOCK FLUSH 100 UNIT/ML IV SOLN
500.0000 [IU] | Freq: Once | INTRAVENOUS | Status: AC
  Administered 2022-11-21: 500 [IU]

## 2022-11-21 MED ORDER — SODIUM CHLORIDE 0.9% FLUSH
10.0000 mL | Freq: Once | INTRAVENOUS | Status: AC
  Administered 2022-11-21: 10 mL

## 2022-11-21 NOTE — Progress Notes (Signed)
Palliative Medicine [facility]  Telephone:([phone] Fax:([phone]) [phone]   Name: Kaitlin Ferrell Date: 11/21/2022 MRN: [id number]  DOB: 1965-05-24  Patient Care Team: Bernadette Hoit, MD as PCP - General (Family Medicine) Reece Packer, MD as Consulting Physician (Medical Oncology) Pickenpack-Cousar, Arty Baumgartner, NP as Nurse Practitioner (Nurse Practitioner)    INTERVAL HISTORY: Kaitlin Ferrell is a 57 y.o. 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 Other history includes gestational trophoblastic disease, insomnia, HTN, and migraines. Palliative ask to see for symptom and pain management and goals of care.   SOCIAL HISTORY:     reports that she has never smoked. She has never used smokeless tobacco. She reports current alcohol use of about 1.0 standard drink of alcohol per week. She reports that she does not use drugs.  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  MEDICATIONS:  Current Outpatient Medications  Medication Sig Dispense Refill   amLODipine (NORVASC) 10 MG tablet Take 1 tablet by mouth daily.     apixaban (ELIQUIS) 5 MG TABS tablet Take 1 tablet (5 mg total) by mouth 2 (two) times daily. 60 tablet 2   Cholecalciferol 1.25 MG (50000 UT) capsule Take 50,000 Units by mouth once a week.     Cyanocobalamin (VITAMIN  B-12 CR PO) Take by mouth.     diphenoxylate-atropine (LOMOTIL) 2.5-0.025 MG tablet Take 1 tablet by mouth 4 (four) times daily as needed for diarrhea or loose stools. 30 tablet 0   dronabinol (MARINOL) 2.5 MG capsule Take 1 capsule (2.5 mg total) by mouth 2 (two) times daily before a meal. 60 capsule 0   ELDERBERRY PO Take by mouth.     eletriptan (RELPAX) 40 MG tablet Take 1 tablet (40 mg total) by mouth as needed for migraine or headache. May repeat in 2 hours if needed. 15 tablet 6   FIBER ADULT GUMMIES PO Take by mouth.     Folic Acid (FOLATE PO) Place 1 mg under the tongue daily.     hydrochlorothiazide (MICROZIDE) 12.5 MG capsule Take 12.5 mg by mouth daily as needed (swelling).     lidocaine-prilocaine (EMLA) cream Apply 1 Application topically as needed. 30 g 2   LORazepam (ATIVAN) 0.5 MG tablet Take 0.5 mg by mouth as needed for anxiety.     magic mouthwash (multi-ingredient) oral suspension Swish and swallow 5 mLs by mouth 4 times daily as needed for throat discomfort 400 mL 1   magnesium oxide (MAG-OX) 400 (241.3 MG) MG tablet TAKE 1 TABLET BY MOUTH TWICE A DAY 60 tablet 6   morphine (MS CONTIN) 30 MG 12 hr tablet Take 1 tablet (30 mg total) by mouth every 8 (eight) hours. 90 tablet 0   morphine (MSIR) 15 MG tablet Take 1 tablet (15 mg total) by mouth every 6 (six) hours as needed for severe pain or moderate pain. 45 tablet 0  OLANZapine (ZYPREXA) 10 MG tablet TAKE 1 TABLET BY MOUTH EVERYDAY AT BEDTIME 90 tablet 1   ondansetron (ZOFRAN) 8 MG tablet Take 1 tablet (8 mg total) by mouth every 8 (eight) hours as needed for nausea or vomiting. 90 tablet 0   ondansetron (ZOFRAN-ODT) 8 MG disintegrating tablet Take 1 tablet (8 mg total) by mouth every 8 (eight) hours as needed for nausea or vomiting. 20 tablet 0   promethazine (PHENERGAN) 25 MG tablet Take 1 tablet (25 mg total) by mouth every 6 (six) hours as needed for nausea or vomiting. 30 tablet 0   scopolamine (TRANSDERM-SCOP) 1  MG/3DAYS Place 1 patch (1.5 mg total) onto the skin every 3 (three) days. 10 patch 12   No current facility-administered medications for this visit.    VITAL SIGNS: LMP 06/26/2013 Comment: low dose BCP There were no vitals filed for this visit.  Estimated body mass index is 21.03 kg/m as calculated from the following:   Height as of 10/24/22: 5\' 2"  (1.575 m).   Weight as of 11/18/22: 115 lb (52.2 kg).   PERFORMANCE STATUS (ECOG) : 1 - Symptomatic but completely ambulatory   Physical Exam General: NAD Cardiovascular: regular rate  Extremities: no edema, no joint deformities Skin: no rashes Neurological: AAO x4  IMPRESSION: Kaitlin Ferrell presents to clinic for follow-up. No acute distress. Is doing well overall. Trying to remain as active as possible. Denies nausea, vomiting, constipation, or diarrhea.   Neoplasm related pain Kaitlin Ferrell reports her pain is well controlled on current regimen.    We discussed her current regimen. She is taking MS Contin 30 mg every 8 hours and MS IR as needed. Not requiring breakthrough medication at this time. Taking medications as prescribed. Given pain is controlled no changes to regimen at this time.    We will continue to closely monitor and adjust as needed.    Decreased Appetite Slowly Improving. Current weight is stable at 119lbs which is up from 115lbs on 10/11. Is not taking Marinol at this time. Feels appetite has naturally improved.   Goals of Care   07/07/22- We discussed her current illness and what it means in the larger context of her on-going co-morbidities. Natural disease trajectory and expectations were discussed.   Patient and husband are realistic in their understanding of her cancer diagnosis and trajectory. She shares they have also discussed at length with her children. Patient states she chose to pursue chemotherapy break due to significance of her symptoms. She was extremely fatigue, weak, could not get out of bed, no  appetite, weight loss, nausea, vomiting, and diarrhea. Kaitlin Ferrell shares she was unable to perform ADLs and husband was having to bathe her. They were concerned that she was facing end-of-life given how poor her quality of life was. Patient and husband speaks to appreciation of her improvement in quality of life over the past several weeks.    I created space and opportunity for patient and husband to discuss goals and hopes. Patient is trying to focus on healthy food options and be as active as she can. They speak to plans of watchful waiting to see what upcoming CT scan results show in terms of her cancer. She will then consider if she would like to pursue additional treatment options versus not. They are clear in their understanding what pros and cons are of pursing or not to pursue further treatment including end-of-life. Kaitlin Ferrell speaks to her quality of life is most important over quantity. She would not  want to spend last moments in a stupor suffering state. Emotional support provided.   We discussed Her current illness and what it means in the larger context of Her on-going co-morbidities. Natural disease trajectory and expectations were discussed.  I discussed the importance of continued conversation with family and their medical providers regarding overall plan of care and treatment options, ensuring decisions are within the context of the patients values and GOCs.  PLAN:  MS Contin 15mg  every 8 hours MS IR for breakthrough pain (not requiring daily) Senna-S for bowel regimen.  Appetite improving. Weight is up 5lbs.  I will plan to see patient back in 4-6 weeks in collaboration to other oncology appointments.   Patient expressed understanding and was in agreement with this plan. She also understands that She can call the clinic at any time with any questions, concerns, or complaints.   Any controlled substances utilized were prescribed in the context of palliative care. PDMP has been  reviewed.    Visit consisted of counseling and education dealing with the complex and emotionally intense issues of symptom management and palliative care in the setting of serious and potentially life-threatening illness.  Kaitlin Ferrell, AGPCNP-BC  Palliative Medicine Team/[facility]  *Please note that this is a verbal dictation therefore any spelling or grammatical errors are due to the "[employer]" system interpretation.

## 2022-11-24 ENCOUNTER — Inpatient Hospital Stay: Payer: BC Managed Care – PPO | Admitting: Hematology and Oncology

## 2022-11-24 ENCOUNTER — Inpatient Hospital Stay: Payer: BC Managed Care – PPO

## 2022-11-24 ENCOUNTER — Inpatient Hospital Stay (HOSPITAL_BASED_OUTPATIENT_CLINIC_OR_DEPARTMENT_OTHER): Payer: BC Managed Care – PPO | Admitting: Nurse Practitioner

## 2022-11-24 VITALS — BP 140/91 | HR 84 | Temp 98.2°F | Resp 16 | Wt 119.7 lb

## 2022-11-24 DIAGNOSIS — Z7901 Long term (current) use of anticoagulants: Secondary | ICD-10-CM | POA: Diagnosis not present

## 2022-11-24 DIAGNOSIS — C259 Malignant neoplasm of pancreas, unspecified: Secondary | ICD-10-CM | POA: Diagnosis not present

## 2022-11-24 DIAGNOSIS — R530 Neoplastic (malignant) related fatigue: Secondary | ICD-10-CM

## 2022-11-24 DIAGNOSIS — C250 Malignant neoplasm of head of pancreas: Secondary | ICD-10-CM | POA: Diagnosis not present

## 2022-11-24 DIAGNOSIS — M549 Dorsalgia, unspecified: Secondary | ICD-10-CM | POA: Diagnosis not present

## 2022-11-24 DIAGNOSIS — R7989 Other specified abnormal findings of blood chemistry: Secondary | ICD-10-CM | POA: Diagnosis not present

## 2022-11-24 DIAGNOSIS — Z452 Encounter for adjustment and management of vascular access device: Secondary | ICD-10-CM | POA: Diagnosis not present

## 2022-11-24 DIAGNOSIS — Z5111 Encounter for antineoplastic chemotherapy: Secondary | ICD-10-CM | POA: Diagnosis not present

## 2022-11-24 DIAGNOSIS — C787 Secondary malignant neoplasm of liver and intrahepatic bile duct: Secondary | ICD-10-CM

## 2022-11-24 DIAGNOSIS — D696 Thrombocytopenia, unspecified: Secondary | ICD-10-CM | POA: Diagnosis not present

## 2022-11-24 DIAGNOSIS — G893 Neoplasm related pain (acute) (chronic): Secondary | ICD-10-CM

## 2022-11-24 DIAGNOSIS — Z515 Encounter for palliative care: Secondary | ICD-10-CM | POA: Diagnosis not present

## 2022-11-24 DIAGNOSIS — Z95828 Presence of other vascular implants and grafts: Secondary | ICD-10-CM | POA: Diagnosis not present

## 2022-11-24 DIAGNOSIS — I81 Portal vein thrombosis: Secondary | ICD-10-CM | POA: Diagnosis not present

## 2022-11-24 DIAGNOSIS — D709 Neutropenia, unspecified: Secondary | ICD-10-CM | POA: Diagnosis not present

## 2022-11-24 DIAGNOSIS — R112 Nausea with vomiting, unspecified: Secondary | ICD-10-CM | POA: Diagnosis not present

## 2022-11-24 DIAGNOSIS — R630 Anorexia: Secondary | ICD-10-CM

## 2022-11-24 DIAGNOSIS — R634 Abnormal weight loss: Secondary | ICD-10-CM

## 2022-11-24 DIAGNOSIS — Z86718 Personal history of other venous thrombosis and embolism: Secondary | ICD-10-CM | POA: Diagnosis not present

## 2022-11-24 LAB — CMP (CANCER CENTER ONLY)
ALT: 17 U/L (ref 0–44)
AST: 24 U/L (ref 15–41)
Albumin: 3.9 g/dL (ref 3.5–5.0)
Alkaline Phosphatase: 89 U/L (ref 38–126)
Anion gap: 7 (ref 5–15)
BUN: 10 mg/dL (ref 6–20)
CO2: 25 mmol/L (ref 22–32)
Calcium: 9.6 mg/dL (ref 8.9–10.3)
Chloride: 107 mmol/L (ref 98–111)
Creatinine: 0.49 mg/dL (ref 0.44–1.00)
GFR, Estimated: >60 mL/min (ref >60)
Glucose, Bld: 102 mg/dL — ABNORMAL HIGH (ref 70–99)
Potassium: 3.7 mmol/L (ref 3.5–5.1)
Sodium: 139 mmol/L (ref 135–145)
Total Bilirubin: 0.4 mg/dL (ref 0.3–1.2)
Total Protein: 7.1 g/dL (ref 6.5–8.1)

## 2022-11-24 LAB — CBC WITH DIFFERENTIAL (CANCER CENTER ONLY)
Abs Immature Granulocytes: 0.01 10*3/uL (ref 0.00–0.07)
Basophils Absolute: 0 10*3/uL (ref 0.0–0.1)
Basophils Relative: 1 %
Eosinophils Absolute: 0 10*3/uL (ref 0.0–0.5)
Eosinophils Relative: 2 %
HCT: 26.6 % — ABNORMAL LOW (ref 36.0–46.0)
Hemoglobin: 8.7 g/dL — ABNORMAL LOW (ref 12.0–15.0)
Immature Granulocytes: 1 %
Lymphocytes Relative: 46 %
Lymphs Abs: 0.8 10*3/uL (ref 0.7–4.0)
MCH: 31.1 pg (ref 26.0–34.0)
MCHC: 32.7 g/dL (ref 30.0–36.0)
MCV: 95 fL (ref 80.0–100.0)
Monocytes Absolute: 0.4 10*3/uL (ref 0.1–1.0)
Monocytes Relative: 23 %
Neutro Abs: 0.5 10*3/uL — ABNORMAL LOW (ref 1.7–7.7)
Neutrophils Relative %: 27 %
Platelet Count: 66 10*3/uL — ABNORMAL LOW (ref 150–400)
RBC: 2.8 MIL/uL — ABNORMAL LOW (ref 3.87–5.11)
RDW: 16.2 % — ABNORMAL HIGH (ref 11.5–15.5)
WBC Count: 1.8 10*3/uL — ABNORMAL LOW (ref 4.0–10.5)
nRBC: 0 % (ref 0.0–0.2)

## 2022-11-24 MED ORDER — SODIUM CHLORIDE 0.9% FLUSH
10.0000 mL | Freq: Once | INTRAVENOUS | Status: AC
  Administered 2022-11-24: 10 mL

## 2022-11-24 NOTE — Progress Notes (Signed)
[facility] Telephone:(336) [phone]   Fax:(336) [phone]  PROGRESS NOTE:  Patient Care Team: Bernadette Hoit, MD as PCP - General (Family Medicine) Reece Packer, MD as Consulting Physician (Medical Oncology) Pickenpack-Cousar, Arty Baumgartner, NP as Nurse Practitioner (Nurse Practitioner)  CHIEF COMPLAINTS/PURPOSE OF CONSULTATION:  Metastatic pancreatic adenocarcinoma involving the liver  ONCOLOGIC HISTORY: Presented with nausea and right upper quadrant pain 03/08/2022: Abdominal US showed multiple hypoechoic masses within the liver are indeterminate.  03/10/2022: MR abdomen: Hypoenhancing mass compatible with pancreatic head adenocarcinoma with numerous targetoid enhancing masses in all segments of the liver, compatible with metastatic disease. The mass severely effaces and nearly occludes the portal vein and splenic vein, and occludes the superior mesenteric vein with some collaterals noted. The mass substantially abuts the proximal transverse duodenum, although overt duodenal invasion is indeterminate. 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 Prominent stool throughout the colon favors constipation. Fluid-fluid level in the gallbladder likely from sludge. 03/14/2022: Establish care with [facility] Hematology/Oncology 03/17/2022: CT chest: no definitive evidence of lung metastases.  03/22/2022: Liver biopsy confirmed metastatic adenocarcinoma, pancreatic primary.  03/30/2022: Cycle 1, Day 1 of mFOLFIRINOX 04/14/2022: Cycle 2, Day 1 of mFOLFIRINOX HELD due to ANC 300.  04/20/2022: Cycle 2, Day 1 of mFOLFIRINOX 05/04/2022: Cycle 3, Day 1 of  mFOLFIRINOX 05/19/2022: Cycle 4, Day 1 of mFOLFIRINOX HELD per patient request.  07/21/2022: Cycle 4, Day 1 of mFOLFIRINOX 08/04/2022: Cycle 5, Day 1 of mFOLFIRINOX HELD due to Plt 81K 08/17/2022: Cycle 5, Day 1 of mFOLFIRINOX 09/01/2022: Cycle 6, Day 1 of mFOLFIRINOX 09/14/2022: Cycle 7, Day 1 of mFOLFIRINOX HELD due to Plt 31K 09/22/2022: Cycle 7, Day 1 of mFOLFIRINOX HELD due to Plt 73K 10/20/2022 CT C/A/P showed mixed picture with progression of disease. FOLFIRINOX d/c.  11/10/2022: Cycle 1 Day 1 of Gemcitabine/Abraxane.  11/24/2022: treatment held due to cytopenias.    HISTORY OF PRESENTING ILLNESS:  Kaitlin Ferrell presents today for a follow up. She was last seen on 11/10/2022.  Patient presents today prior to Cycle 1 Day 8 of Gem/Abraxane.   Kaitlin Ferrell reports she has been feeling well overall in the interim since her last visit.  She reports her appetite is good and she stopped taking the Marinol therapy.  She notes that she has been drinking Carnation drinks/shakes.  She reports that she is eating chicken but not as much in the way of red meat, though she has had some [employer] roast beef sandwiches.  She is not having any nosebleeding, gum bleeding, or dark stools.  She reports she does feel tired today with energy about 3 out of 10.  She is not having any runny nose, sore throat, or cough.  She reports that she is having some occasional stomach discomfort but not strong enough to require pain medication.  She reports that she is not also having any nausea, vomiting, or diarrhea.  She denies fevers, chills, sweats, shortness of breath, chest pain or cough. She has no other complaints. Rest of the 10 point ROS is below.  Today we discussed her cytopenias and the need to hold on further treatment until her blood counts recover.  Will plan to see her  back in 2 weeks time in order to reevaluate and determine if treatment can be restarted at a lower dose.   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   treated with 4 cycles of actinomycin D from 12-02-11 thru 01-13-12   H/O molar pregnancy, antepartum    Hypertension    Migraine    Miscarriage     SURGICAL HISTORY: Past Surgical History:  Procedure Laterality Date   BREAST BIOPSY  2001   Left   BREAST LUMPECTOMY     DILATION AND CURETTAGE OF UTERUS  1992, 09/2011   DILATION AND EVACUATION  2011   INSERTION OF MESH N/A 09/03/2013   Procedure: INSERTION OF MESH;  Surgeon: Ardeth Sportsman, MD;  Location: [facility];  Service: General;  Laterality: N/A;   IR CHEST FLUORO  04/14/2022   IR IMAGING GUIDED PORT INSERTION  03/22/2022   IR IMAGING GUIDED PORT INSERTION  05/02/2022   IR PORT REPAIR CENTRAL VENOUS ACCESS DEVICE  04/19/2022   IR REMOVAL TUN CV CATH W/O FL  05/02/2022   IR US LIVER BIOPSY  03/22/2022   PORTACATH PLACEMENT  11/2011   PORTACATH PLACEMENT     REMOVAL  MARCH 2014   VENTRAL HERNIA REPAIR N/A 09/03/2013   Procedure: LAPAROSCOPIC VENTRAL WALL HERNIA REPAIR;  Surgeon: Ardeth Sportsman, MD;  Location: [facility];  Service: General;  Laterality: N/A;   WISDOM TOOTH EXTRACTION     in 11th grade    SOCIAL HISTORY: Social History   Socioeconomic History   Marital status: Married    Spouse name: [name]   Number of children: 2   Years of education: college   Highest education level: Not on file  Occupational History    Comment: [employer]  Tobacco Use   Smoking status: Never   Smokeless tobacco: Never  Substance and Sexual Activity   Alcohol use: Yes    Alcohol/week: 1.0 standard drink of alcohol    Types: 1 Glasses of wine per week    Comment: occas   Drug use: No   Sexual activity: Not Currently    Birth control/protection: Pill  Other Topics Concern   Not on file  Social History Narrative   Patient is  married [name]). Patient works part time at [employer].   Right handed.   Patient has her CMA.   Caffeine- None         Social Determinants of Health   Financial Resource Strain: Not on file  Food Insecurity: No Food Insecurity (05/10/2022)   Hunger Vital Sign    Worried About Running Out of Food in the Last Year: Never true    Ran Out of Food in the Last Year: Never true  Transportation Needs: No Transportation Needs (05/10/2022)   PRAPARE - Administrator, Civil Service (Medical): No    Lack of Transportation (Non-Medical): No  Physical Activity: Not on file  Stress: Not on file  Social Connections: Not on file  Intimate Partner Violence: Not At Risk (05/10/2022)   Humiliation, Afraid, Rape, and Kick questionnaire    Fear of Current or Ex-Partner: No    Emotionally Abused: No    Physically Abused: No    Sexually Abused: No    FAMILY HISTORY: Family History  Problem Relation Age  of Onset   Breast cancer Mother        dx > 50   Heart attack Mother    Diabetes Father    Breast cancer Maternal Aunt        dx > 50   Breast cancer Maternal Aunt        dx > 50   Breast cancer Maternal Grandmother        ? < 50   Stroke Paternal Grandmother    Alcoholism Other     ALLERGIES:  is allergic to compazine [prochlorperazine edisylate], propofol, oxycodone-acetaminophen, prochlorperazine, and labetalol.  MEDICATIONS:  Current Outpatient Medications  Medication Sig Dispense Refill   amLODipine (NORVASC) 10 MG tablet Take 1 tablet by mouth daily.     apixaban (ELIQUIS) 5 MG TABS tablet Take 1 tablet (5 mg total) by mouth 2 (two) times daily. 60 tablet 2   Cholecalciferol 1.25 MG (50000 UT) capsule Take 50,000 Units by mouth once a week.     Cyanocobalamin (VITAMIN B-12 CR PO) Take by mouth.     diphenoxylate-atropine (LOMOTIL) 2.5-0.025 MG tablet Take 1 tablet by mouth 4 (four) times daily as needed for diarrhea or loose stools. 30 tablet 0   dronabinol (MARINOL) 2.5 MG  capsule Take 1 capsule (2.5 mg total) by mouth 2 (two) times daily before a meal. 60 capsule 0   ELDERBERRY PO Take by mouth.     eletriptan (RELPAX) 40 MG tablet Take 1 tablet (40 mg total) by mouth as needed for migraine or headache. May repeat in 2 hours if needed. 15 tablet 6   FIBER ADULT GUMMIES PO Take by mouth.     Folic Acid (FOLATE PO) Place 1 mg under the tongue daily.     hydrochlorothiazide (MICROZIDE) 12.5 MG capsule Take 12.5 mg by mouth daily as needed (swelling).     lidocaine-prilocaine (EMLA) cream Apply 1 Application topically as needed. 30 g 2   LORazepam (ATIVAN) 0.5 MG tablet Take 0.5 mg by mouth as needed for anxiety.     magic mouthwash (multi-ingredient) oral suspension Swish and swallow 5 mLs by mouth 4 times daily as needed for throat discomfort 400 mL 1   magnesium oxide (MAG-OX) 400 (241.3 MG) MG tablet TAKE 1 TABLET BY MOUTH TWICE A DAY 60 tablet 6   morphine (MS CONTIN) 30 MG 12 hr tablet Take 1 tablet (30 mg total) by mouth every 8 (eight) hours. 90 tablet 0   morphine (MSIR) 15 MG tablet Take 1 tablet (15 mg total) by mouth every 6 (six) hours as needed for severe pain or moderate pain. 45 tablet 0   OLANZapine (ZYPREXA) 10 MG tablet TAKE 1 TABLET BY MOUTH EVERYDAY AT BEDTIME 90 tablet 1   ondansetron (ZOFRAN) 8 MG tablet Take 1 tablet (8 mg total) by mouth every 8 (eight) hours as needed for nausea or vomiting. 90 tablet 0   ondansetron (ZOFRAN-ODT) 8 MG disintegrating tablet Take 1 tablet (8 mg total) by mouth every 8 (eight) hours as needed for nausea or vomiting. 20 tablet 0   promethazine (PHENERGAN) 25 MG tablet Take 1 tablet (25 mg total) by mouth every 6 (six) hours as needed for nausea or vomiting. 30 tablet 0   scopolamine (TRANSDERM-SCOP) 1 MG/3DAYS Place 1 patch (1.5 mg total) onto the skin every 3 (three) days. 10 patch 12   No current facility-administered medications for this visit.    REVIEW OF SYSTEMS:   Constitutional: ( - ) fevers, ( - )  chills , ( - ) night sweats Eyes: ( - ) blurriness of vision, ( - ) double vision, ( - ) watery eyes Ears, nose, mouth, throat, and face: ( - ) mucositis, ( - ) sore throat Respiratory: ( - ) cough, ( - ) dyspnea, ( - ) wheezes Cardiovascular: ( - ) palpitation, ( - ) chest discomfort, ( - ) lower extremity swelling Gastrointestinal:  (+) nausea, ( - ) heartburn, ( -) change in bowel habits Skin: ( - ) abnormal skin rashes Lymphatics: ( - ) new lymphadenopathy, ( + ) easy bruising Neurological: ( - ) numbness, ( - ) tingling, ( - ) new weaknesses Behavioral/Psych: ( - ) mood change, ( - ) new changes  All other systems were reviewed with the patient and are negative.  PHYSICAL EXAMINATION: ECOG PERFORMANCE STATUS: 1 - Symptomatic but completely ambulatory  There were no vitals filed for this visit.     There were no vitals filed for this visit.    GENERAL: well appearing female in NAD  SKIN: skin color, texture, turgor are normal, no rashes or significant lesions. Mild bruising with nodularity involving bilateral upper and lower extremities.  EYES: conjunctiva are pink and non-injected, sclera clear LUNGS: clear to auscultation and percussion with normal breathing effort HEART: regular rate & rhythm and no murmurs and no lower extremity edema Musculoskeletal: no cyanosis of digits and no clubbing  PSYCH: alert & oriented x 3, fluent speech NEURO: no focal motor/sensory deficits  LABORATORY DATA:  I have reviewed the data as listed    Latest Ref Rng & Units 11/24/2022    9:24 AM 11/21/2022   10:05 AM 11/18/2022    9:45 AM  CBC  WBC 4.0 - 10.5 K/uL 1.8  2.8  3.3   Hemoglobin 12.0 - 15.0 g/dL 8.7  8.3  8.9   Hematocrit 36.0 - 46.0 % 26.6  25.9  27.2   Platelets 150 - 400 K/uL 66  22  13        Latest Ref Rng & Units 11/24/2022    9:24 AM 11/21/2022   10:05 AM 11/18/2022    9:45 AM  CMP  Glucose 70 - 99 mg/dL 578  469  629   BUN 6 - 20 mg/dL 10  15  14    Creatinine  0.44 - 1.00 mg/dL 5.28  4.13  2.44   Sodium 135 - 145 mmol/L 139  140  138   Potassium 3.5 - 5.1 mmol/L 3.7  3.6  3.5   Chloride 98 - 111 mmol/L 107  107  104   CO2 22 - 32 mmol/L 25  28  29    Calcium 8.9 - 10.3 mg/dL 9.6  9.4  9.7   Total Protein 6.5 - 8.1 g/dL 7.1  7.0  7.4   Total Bilirubin 0.3 - 1.2 mg/dL 0.4  0.3  0.4   Alkaline Phos 38 - 126 U/L 89  95  100   AST 15 - 41 U/L 24  29  41   ALT 0 - 44 U/L 17  22  27       RADIOGRAPHIC STUDIES: I have personally reviewed the radiological images as listed and agreed with the findings in the report. No results found.  ASSESSMENT & PLAN Cilicia Borden is a 57 y.o. female who returns to the clinic for newly diagnosed pancreatic adenocarcinoma.    #Stage IV Pancreatic adenocarcinoma involving the liver: --Liver biopsy on 03/22/2022 confirmed metastatic adenocarcinoma --Due to metastatic  involvement in the liver, patient is not a candidate for curative therapies including surgery --Discussed mainstay treatment will be chemotherapy. Dr. Leonides Schanz recommends chemotherapy regimen with FOLFIRINOX q 2 weeks.  --Plan to obtain restaging CT scans every 3 months to assess treatment response.  --[facility] Testing: MS-stable, 2 Muts/Mb, ATMH616fs*31, KRASG12L, MDM2 amplification, CDKN2A/B. No targetable mutations.  --Started mFOLFIRINOX on 03/30/2022 --After Cycle 3 on 05/04/2022, patient was on a treatment break due to poor tolerance and patient's request. --Most recent CT CAP from 06/30/2022 showed treatment response. --Patient resumed FOLFIRINOX without dose modification on 07/21/2022  --progression noted on CT scan from 10/20/2022, d/c FOLFIRINOX.  PLAN: --Due for Cycle 1, Day 8 Gem/Abraxane --Labs from today reviewed with patient.  White blood cell 1.8, hemoglobin 8.7, MCV 95, platelets 66. ANC 500 --Will hold chemotherapy today and plan to readdress in 2 weeks time.  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  --Currently on Eliquis 5 mg BID  #Neutropenia --Neutropenic precautions given to patient including monitoring for fevers.   #Elevated LFTs --Most likely secondary to chemotherapy. LFTs today within normal limits  --Monitor for now  #Nausea/Vomiting: --Secondary to chemotherapy and well controlled.  -- Continue to take scopolamine patch, phenergan 25 mg q 6 hours, zofran 8mg  q8H PRN for nausea, zyprexa nightly PRN for nausea.   #Epigastric/RUQ/back pain: --Likely secondary to underlying malignancy --Tramadol was ineffective.  --Current pain regimen includes MS contin to 30 mg q 8 hours and Norco 10-325 mg.   #Appetite loss/weight loss: --Weight has improved since last week from 116 to 119 lbs today.  --Prescribed Marinol but is not currently taking it. --Encouraged to eat small, frequent meals and supplement with protein shakes --Follows with [facility] nutrition team  #Family history of breast cancer: --Due to strong family history of cancer and has undergone genetic testing in the past.  --Underwent genetic testing on 04/07/2022 that was negative. 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   #Supportive Care -- chemotherapy education to be scheduled  -- port placement complete  -- EMLA cream for port  Orders Placed This Encounter  Procedures   CBC with Differential (Cancer Center Only)    Standing Status:   Future    Standing Expiration Date:   12/09/2023   CMP (Cancer Center only)    Standing Status:   Future    Standing Expiration Date:   12/09/2023   CBC with Differential (Cancer Center Only)    Standing Status:   Future     Standing Expiration Date:   12/16/2023   CMP (Cancer Center only)    Standing Status:   Future    Standing Expiration Date:   12/16/2023   CBC with Differential (Cancer Center Only)    Standing Status:   Future    Standing Expiration Date:   12/23/2023   CMP (Cancer Center only)    Standing Status:   Future    Standing Expiration Date:   12/23/2023   All questions were answered. The patient knows to call the clinic with any problems, questions or concerns.  I have spent a total of 30 minutes minutes of  face-to-face and non-face-to-face time, preparing to see the patient,  performing a medically appropriate examination, counseling and educating the patient, documenting clinical information in the electronic health record,  and care coordination.   Ulysees Barns, MD Department of Hematology/Oncology [facility] Phone: [phone] Pager: [phone] Email: [email or web address]

## 2022-11-25 ENCOUNTER — Encounter: Payer: Self-pay | Admitting: Hematology and Oncology

## 2022-11-25 ENCOUNTER — Encounter: Payer: Self-pay | Admitting: Nurse Practitioner

## 2022-12-02 ENCOUNTER — Other Ambulatory Visit: Payer: Self-pay | Admitting: Nurse Practitioner

## 2022-12-02 DIAGNOSIS — G893 Neoplasm related pain (acute) (chronic): Secondary | ICD-10-CM

## 2022-12-02 DIAGNOSIS — C259 Malignant neoplasm of pancreas, unspecified: Secondary | ICD-10-CM

## 2022-12-02 DIAGNOSIS — Z515 Encounter for palliative care: Secondary | ICD-10-CM

## 2022-12-02 MED ORDER — MORPHINE SULFATE ER 30 MG PO TBCR
30.0000 mg | EXTENDED_RELEASE_TABLET | Freq: Three times a day (TID) | ORAL | 0 refills | Status: DC
Start: 2022-12-02 — End: 2023-01-13

## 2022-12-08 ENCOUNTER — Ambulatory Visit: Payer: BC Managed Care – PPO

## 2022-12-08 ENCOUNTER — Other Ambulatory Visit: Payer: BC Managed Care – PPO

## 2022-12-08 ENCOUNTER — Ambulatory Visit: Payer: BC Managed Care – PPO | Admitting: Hematology and Oncology

## 2022-12-08 ENCOUNTER — Ambulatory Visit: Payer: BC Managed Care – PPO | Admitting: Nurse Practitioner

## 2022-12-09 ENCOUNTER — Inpatient Hospital Stay: Payer: BC Managed Care – PPO

## 2022-12-09 ENCOUNTER — Inpatient Hospital Stay (HOSPITAL_BASED_OUTPATIENT_CLINIC_OR_DEPARTMENT_OTHER): Payer: BC Managed Care – PPO | Admitting: Hematology

## 2022-12-09 ENCOUNTER — Inpatient Hospital Stay: Payer: BC Managed Care – PPO | Attending: Physician Assistant

## 2022-12-09 VITALS — BP 105/70 | HR 72 | Temp 98.1°F | Resp 18 | Wt 119.3 lb

## 2022-12-09 DIAGNOSIS — C259 Malignant neoplasm of pancreas, unspecified: Secondary | ICD-10-CM | POA: Diagnosis not present

## 2022-12-09 DIAGNOSIS — R112 Nausea with vomiting, unspecified: Secondary | ICD-10-CM | POA: Diagnosis not present

## 2022-12-09 DIAGNOSIS — G893 Neoplasm related pain (acute) (chronic): Secondary | ICD-10-CM

## 2022-12-09 DIAGNOSIS — I82621 Acute embolism and thrombosis of deep veins of right upper extremity: Secondary | ICD-10-CM | POA: Diagnosis not present

## 2022-12-09 DIAGNOSIS — M549 Dorsalgia, unspecified: Secondary | ICD-10-CM | POA: Diagnosis not present

## 2022-12-09 DIAGNOSIS — R630 Anorexia: Secondary | ICD-10-CM | POA: Insufficient documentation

## 2022-12-09 DIAGNOSIS — Z7901 Long term (current) use of anticoagulants: Secondary | ICD-10-CM | POA: Diagnosis not present

## 2022-12-09 DIAGNOSIS — I81 Portal vein thrombosis: Secondary | ICD-10-CM | POA: Diagnosis not present

## 2022-12-09 DIAGNOSIS — Z452 Encounter for adjustment and management of vascular access device: Secondary | ICD-10-CM | POA: Insufficient documentation

## 2022-12-09 DIAGNOSIS — Z5111 Encounter for antineoplastic chemotherapy: Secondary | ICD-10-CM

## 2022-12-09 DIAGNOSIS — D649 Anemia, unspecified: Secondary | ICD-10-CM | POA: Diagnosis not present

## 2022-12-09 DIAGNOSIS — Z95828 Presence of other vascular implants and grafts: Secondary | ICD-10-CM

## 2022-12-09 DIAGNOSIS — C250 Malignant neoplasm of head of pancreas: Secondary | ICD-10-CM | POA: Diagnosis not present

## 2022-12-09 DIAGNOSIS — C787 Secondary malignant neoplasm of liver and intrahepatic bile duct: Secondary | ICD-10-CM | POA: Diagnosis not present

## 2022-12-09 DIAGNOSIS — R7989 Other specified abnormal findings of blood chemistry: Secondary | ICD-10-CM | POA: Insufficient documentation

## 2022-12-09 LAB — CBC WITH DIFFERENTIAL (CANCER CENTER ONLY)
Abs Immature Granulocytes: 0.01 10*3/uL (ref 0.00–0.07)
Basophils Absolute: 0 10*3/uL (ref 0.0–0.1)
Basophils Relative: 1 %
Eosinophils Absolute: 0 10*3/uL (ref 0.0–0.5)
Eosinophils Relative: 1 %
HCT: 30.5 % — ABNORMAL LOW (ref 36.0–46.0)
Hemoglobin: 9.7 g/dL — ABNORMAL LOW (ref 12.0–15.0)
Immature Granulocytes: 0 %
Lymphocytes Relative: 33 %
Lymphs Abs: 1.3 10*3/uL (ref 0.7–4.0)
MCH: 30.6 pg (ref 26.0–34.0)
MCHC: 31.8 g/dL (ref 30.0–36.0)
MCV: 96.2 fL (ref 80.0–100.0)
Monocytes Absolute: 0.4 10*3/uL (ref 0.1–1.0)
Monocytes Relative: 11 %
Neutro Abs: 2.1 10*3/uL (ref 1.7–7.7)
Neutrophils Relative %: 54 %
Platelet Count: 144 10*3/uL — ABNORMAL LOW (ref 150–400)
RBC: 3.17 MIL/uL — ABNORMAL LOW (ref 3.87–5.11)
RDW: 15 % (ref 11.5–15.5)
WBC Count: 3.9 10*3/uL — ABNORMAL LOW (ref 4.0–10.5)
nRBC: 0 % (ref 0.0–0.2)

## 2022-12-09 LAB — CMP (CANCER CENTER ONLY)
ALT: 18 U/L (ref 0–44)
AST: 24 U/L (ref 15–41)
Albumin: 3.9 g/dL (ref 3.5–5.0)
Alkaline Phosphatase: 94 U/L (ref 38–126)
Anion gap: 4 — ABNORMAL LOW (ref 5–15)
BUN: 13 mg/dL (ref 6–20)
CO2: 27 mmol/L (ref 22–32)
Calcium: 9.1 mg/dL (ref 8.9–10.3)
Chloride: 108 mmol/L (ref 98–111)
Creatinine: 0.62 mg/dL (ref 0.44–1.00)
GFR, Estimated: >60 mL/min (ref >60)
Glucose, Bld: 121 mg/dL — ABNORMAL HIGH (ref 70–99)
Potassium: 3.7 mmol/L (ref 3.5–5.1)
Sodium: 139 mmol/L (ref 135–145)
Total Bilirubin: 0.4 mg/dL (ref 0.3–1.2)
Total Protein: 7.2 g/dL (ref 6.5–8.1)

## 2022-12-09 MED ORDER — SODIUM CHLORIDE 0.9 % IV SOLN: Freq: Once | INTRAVENOUS | Status: AC

## 2022-12-09 MED ORDER — SODIUM CHLORIDE 0.9% FLUSH
10.0000 mL | INTRAVENOUS | Status: DC | PRN
  Administered 2022-12-09: 10 mL

## 2022-12-09 MED ORDER — SODIUM CHLORIDE 0.9 % IV SOLN
1000.0000 mg/m2 | Freq: Once | INTRAVENOUS | Status: AC
  Administered 2022-12-09: 1558 mg via INTRAVENOUS
  Filled 2022-12-09: qty 40.98

## 2022-12-09 MED ORDER — PACLITAXEL PROTEIN-BOUND CHEMO INJECTION 100 MG
100.0000 mg/m2 | Freq: Once | INTRAVENOUS | Status: AC
Start: 2022-12-09 — End: 2022-12-09
  Administered 2022-12-09: 150 mg via INTRAVENOUS
  Filled 2022-12-09: qty 30

## 2022-12-09 MED ORDER — HEPARIN SOD (PORK) LOCK FLUSH 100 UNIT/ML IV SOLN
500.0000 [IU] | Freq: Once | INTRAVENOUS | Status: AC | PRN
  Administered 2022-12-09: 500 [IU]

## 2022-12-09 MED ORDER — ONDANSETRON HCL 4 MG/2ML IJ SOLN
8.0000 mg | Freq: Once | INTRAMUSCULAR | Status: AC
  Administered 2022-12-09: 8 mg via INTRAVENOUS
  Filled 2022-12-09: qty 4

## 2022-12-09 MED ORDER — SODIUM CHLORIDE 0.9% FLUSH
10.0000 mL | Freq: Once | INTRAVENOUS | Status: AC
  Administered 2022-12-09: 10 mL

## 2022-12-09 NOTE — Patient Instructions (Signed)
[facility]  Discharge Instructions: Thank you for choosing [facility] to provide your oncology and hematology care.   If you have a lab appointment with the Cancer Center, please go directly to the Cancer Center and check in at the registration area.   Wear comfortable clothing and clothing appropriate for easy access to any Portacath or PICC line.   We strive to give you quality time with your provider. You may need to reschedule your appointment if you arrive late (15 or more minutes).  Arriving late affects you and other patients whose appointments are after yours.  Also, if you miss three or more appointments without notifying the office, you may be dismissed from the clinic at the provider's discretion.      For prescription refill requests, have your pharmacy contact our office and allow 72 hours for refills to be completed.    Today you received the following chemotherapy and/or immunotherapy agents Abraxane/Gemzar.      To help prevent nausea and vomiting after your treatment, we encourage you to take your nausea medication as directed.  BELOW ARE SYMPTOMS THAT SHOULD BE REPORTED IMMEDIATELY: *FEVER GREATER THAN 100.4 F (38 C) OR HIGHER *CHILLS OR SWEATING *NAUSEA AND VOMITING THAT IS NOT CONTROLLED WITH YOUR NAUSEA MEDICATION *UNUSUAL SHORTNESS OF BREATH *UNUSUAL BRUISING OR BLEEDING *URINARY PROBLEMS (pain or burning when urinating, or frequent urination) *BOWEL PROBLEMS (unusual diarrhea, constipation, pain near the anus) TENDERNESS IN MOUTH AND THROAT WITH OR WITHOUT PRESENCE OF ULCERS (sore throat, sores in mouth, or a toothache) UNUSUAL RASH, SWELLING OR PAIN  UNUSUAL VAGINAL DISCHARGE OR ITCHING   Items with * indicate a potential emergency and should be followed up as soon as possible or go to the Emergency Department if any problems should occur.  Please show the CHEMOTHERAPY ALERT CARD or IMMUNOTHERAPY ALERT CARD at  check-in to the Emergency Department and triage nurse.  Should you have questions after your visit or need to cancel or reschedule your appointment, please contact [facility]  Dept: [phone]  and follow the prompts.  Office hours are 8:00 a.m. to 4:30 p.m. Monday - Friday. Please note that voicemails left after 4:00 p.m. may not be returned until the following business day.  We are closed weekends and major holidays. You have access to a nurse at all times for urgent questions. Please call the main number to the clinic Dept: [phone] and follow the prompts.   For any non-urgent questions, you may also contact your provider using MyChart. We now offer e-Visits for anyone 18 and older to request care online for non-urgent symptoms. For details visit [email or web address].   Also download the MyChart app! Go to the app store, search "MyChart", open the app, select [place], and log in with your MyChart username and password.   

## 2022-12-09 NOTE — Progress Notes (Signed)
HEMATOLOGY/ONCOLOGY CONSULTATION NOTE  Date of Service: 12/09/2022  Patient Care Team: Bernadette Hoit, MD as PCP - General (Family Medicine) Reece Packer, MD as Consulting Physician (Medical Oncology) Pickenpack-Cousar, Arty Baumgartner, NP as Nurse Practitioner (Nurse Practitioner)  CHIEF COMPLAINTS/PURPOSE OF CONSULTATION:  Evaluation and management of Metastatic pancreatic adenocarcinoma involving the liver.   ONCOLOGIC HISTORY: Presented with nausea and right upper quadrant pain 03/08/2022: Abdominal US showed multiple hypoechoic masses within the liver are indeterminate.  03/10/2022: MR abdomen: Hypoenhancing mass compatible with pancreatic head adenocarcinoma with numerous targetoid enhancing masses in all segments of the liver, compatible with metastatic disease. The mass severely effaces and nearly occludes the portal vein and splenic vein, and occludes the superior mesenteric vein with some collaterals noted. The mass substantially abuts the proximal transverse duodenum, although overt duodenal invasion is indeterminate. 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 Prominent stool throughout the colon favors constipation. Fluid-fluid level in the gallbladder likely from sludge. 03/14/2022: Establish care with [facility] Hematology/Oncology 03/17/2022: CT chest: no definitive evidence of lung metastases.  03/22/2022: Liver biopsy confirmed metastatic adenocarcinoma, pancreatic primary.  03/30/2022: Cycle 1, Day 1 of mFOLFIRINOX 04/14/2022: Cycle 2, Day 1 of mFOLFIRINOX HELD due to ANC 300.  04/20/2022: Cycle 2, Day 1 of mFOLFIRINOX 05/04/2022: Cycle 3, Day 1 of  mFOLFIRINOX 05/19/2022: Cycle 4, Day 1 of mFOLFIRINOX HELD per patient request.  07/21/2022: Cycle 4, Day 1 of mFOLFIRINOX 08/04/2022: Cycle 5, Day 1 of mFOLFIRINOX HELD due to Plt 81K 08/17/2022: Cycle 5, Day 1 of mFOLFIRINOX 09/01/2022: Cycle 6, Day 1 of mFOLFIRINOX 09/14/2022: Cycle 7, Day 1 of mFOLFIRINOX HELD due to Plt 31K 09/22/2022: Cycle 7, Day 1 of mFOLFIRINOX HELD due to Plt 73K 10/20/2022 CT C/A/P showed mixed picture with progression of disease. FOLFIRINOX d/c.  11/10/2022: Cycle 1 Day 1 of Gemcitabine/Abraxane.  11/24/2022: treatment held due to cytopenias.   HISTORY OF PRESENTING ILLNESS:  Kaitlin Ferrell is a wonderful 57 y.o. female who has previously been a patient of Dr. Leonides Schanz. He is here for evaluation and management of Metastatic pancreatic adenocarcinoma involving the liver.   He was seen by [facility] on 10/12/2022 and reported fair appetite, decreased weight loss, persistent fatigue, and new bruising and nodularity in her abdomen, which was improving.  Patient was last seen by Dr. Leonides Schanz on 11/24/2022 and reported occasional mild stomach discomfort.   She is currently on second line Gem/Abraxane due to concerns of progression. Her last treatment was held due to cytopenias.   Today, she presents prior to cycle 2 day 1 of treatment. She is accompanied by an additional family member.   Her Abraxane was recently adjusted from 125 mg/m2 to 100 mg/m2. She continues to be on same dose Gemcitabine.   Patient complains of a knot in her upper abdomen, which has worsened in the last few days.   Patient takes long-acting Morphine which does sometimes control symptoms for 8 hours. She also takes breakthrough morphine once a day.  She reports some bowel issues and reports that laxatives do improve symptoms. Patient denies feeling bloated or gassy after eating. Her appetite has been normal.   She denies any other issues with her new  treatment regimen. She denies any concerns  with neuropathy, tingling, numbness, bleeding issues, or infection issues. Patient denies any issues with her port, leg swelling, or medications requiring refills.   Patient has gained 4 pounds since 11/18/2022 and currently weighs 119 pounds.   Patient does endorse dizziness occasionally. Patient reports that she generally does not enjoy drinking water. She typically drinks liquids such as protein shakes, teas, decaf coffee, root beer, and soup.   She reports that she is not taking marinol anymore.   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   treated with 4 cycles of actinomycin D from 12-02-11 thru 01-13-12   H/O molar pregnancy, antepartum    Hypertension    Migraine    Miscarriage     SURGICAL HISTORY: Past Surgical History:  Procedure Laterality Date   BREAST BIOPSY  2001   Left   BREAST LUMPECTOMY     DILATION AND CURETTAGE OF UTERUS  1992, 09/2011   DILATION AND EVACUATION  2011   INSERTION OF MESH N/A 09/03/2013   Procedure: INSERTION OF MESH;  Surgeon: Ardeth Sportsman, MD;  Location: [facility];  Service: General;  Laterality: N/A;   IR CHEST FLUORO  04/14/2022   IR IMAGING GUIDED PORT INSERTION  03/22/2022   IR IMAGING GUIDED PORT INSERTION  05/02/2022   IR PORT REPAIR CENTRAL VENOUS ACCESS DEVICE  04/19/2022   IR REMOVAL TUN CV CATH W/O FL  05/02/2022   IR US LIVER BIOPSY  03/22/2022   PORTACATH PLACEMENT  11/2011   PORTACATH PLACEMENT     REMOVAL  MARCH 2014   VENTRAL HERNIA REPAIR N/A 09/03/2013   Procedure: LAPAROSCOPIC VENTRAL WALL HERNIA REPAIR;  Surgeon: Ardeth Sportsman, MD;  Location: [facility];  Service: General;  Laterality: N/A;   WISDOM TOOTH EXTRACTION     in 11th grade    SOCIAL HISTORY: Social History   Socioeconomic History   Marital status: Married    Spouse  name: [name]   Number of children: 2   Years of education: college   Highest education level: Not on file  Occupational History    Comment: [employer]  Tobacco Use   Smoking status: Never   Smokeless tobacco: Never  Substance and Sexual Activity   Alcohol use: Yes    Alcohol/week: 1.0 standard drink of alcohol    Types: 1 Glasses of wine per week    Comment: occas   Drug use: No   Sexual activity: Not Currently    Birth control/protection: Pill  Other Topics Concern   Not on file  Social History Narrative   Patient is married [name]). Patient works part time at [employer].   Right handed.   Patient has her CMA.   Caffeine- None         Social Determinants of Health   Financial Resource Strain: Not on file  Food Insecurity: No Food Insecurity (05/10/2022)   Hunger Vital Sign    Worried About Running Out of Food in the Last Year: Never true    Ran Out of Food in the Last Year: Never true  Transportation Needs: No Transportation Needs (05/10/2022)   PRAPARE - Administrator, Civil Service (Medical): No    Lack of Transportation (Non-Medical): No  Physical Activity: Not on file  Stress: Not on file  Social Connections: Not on file  Intimate Partner Violence: Not At Risk (05/10/2022)   Humiliation, Afraid, Rape, and Kick questionnaire    Fear of Current or Ex-Partner: No    Emotionally Abused: No    Physically Abused: No    Sexually Abused: No    FAMILY HISTORY: Family History  Problem Relation Age of Onset   Breast cancer Mother        dx > 50   Heart attack Mother    Diabetes Father    Breast cancer Maternal Aunt        dx > 50   Breast cancer Maternal Aunt        dx > 50   Breast cancer Maternal Grandmother        ? < 50   Stroke Paternal Grandmother    Alcoholism Other     ALLERGIES:  is allergic to compazine [prochlorperazine edisylate], propofol, oxycodone-acetaminophen, prochlorperazine, and labetalol.  MEDICATIONS:  Current Outpatient  Medications  Medication Sig Dispense Refill   amLODipine (NORVASC) 10 MG tablet Take 1 tablet by mouth daily.     apixaban (ELIQUIS) 5 MG TABS tablet Take 1 tablet (5 mg total) by mouth 2 (two) times daily. 60 tablet 2   Cholecalciferol 1.25 MG (50000 UT) capsule Take 50,000 Units by mouth once a week.     Cyanocobalamin (VITAMIN B-12 CR PO) Take by mouth.     diphenoxylate-atropine (LOMOTIL) 2.5-0.025 MG tablet Take 1 tablet by mouth 4 (four) times daily as needed for diarrhea or loose stools. 30 tablet 0   dronabinol (MARINOL) 2.5 MG capsule Take 1 capsule (2.5 mg total) by mouth 2 (two) times daily before a meal. 60 capsule 0   ELDERBERRY PO Take by mouth.     eletriptan (RELPAX) 40 MG tablet Take 1 tablet (40 mg total) by mouth as needed for migraine or headache. May repeat in 2 hours if needed. 15 tablet 6   FIBER ADULT GUMMIES PO Take by mouth.     Folic Acid (FOLATE PO) Place 1 mg under the tongue daily.     hydrochlorothiazide (MICROZIDE) 12.5 MG capsule Take 12.5 mg by mouth daily as needed (swelling).     lidocaine-prilocaine (EMLA) cream Apply 1 Application topically as needed. 30 g 2   LORazepam (ATIVAN) 0.5 MG tablet Take 0.5 mg by mouth as needed for anxiety.     magic mouthwash (multi-ingredient) oral suspension Swish and swallow 5 mLs by mouth 4 times daily as needed for throat discomfort 400 mL 1   magnesium oxide (MAG-OX) 400 (241.3 MG) MG tablet TAKE 1 TABLET BY MOUTH TWICE A DAY 60 tablet 6   morphine (MS CONTIN) 30 MG 12 hr tablet Take 1 tablet (30 mg total) by mouth every 8 (eight) hours. 90 tablet 0   morphine (MSIR) 15 MG tablet Take 1 tablet (15 mg total) by mouth every 6 (six) hours as needed for severe pain or moderate pain. 45 tablet 0   OLANZapine (ZYPREXA) 10 MG tablet TAKE 1 TABLET BY MOUTH EVERYDAY AT BEDTIME 90 tablet 1   ondansetron (ZOFRAN) 8 MG tablet Take 1 tablet (8 mg total) by mouth every 8 (eight) hours as needed for nausea or vomiting. 90 tablet 0    ondansetron (ZOFRAN-ODT) 8 MG disintegrating tablet Take 1 tablet (8 mg total) by mouth every 8 (eight) hours as needed for nausea or vomiting. 20 tablet 0   promethazine (PHENERGAN) 25 MG tablet Take 1  tablet (25 mg total) by mouth every 6 (six) hours as needed for nausea or vomiting. 30 tablet 0   scopolamine (TRANSDERM-SCOP) 1 MG/3DAYS Place 1 patch (1.5 mg total) onto the skin every 3 (three) days. 10 patch 12   No current facility-administered medications for this visit.    REVIEW OF SYSTEMS:    10 Point review of Systems was done is negative except as noted above.  PHYSICAL EXAMINATION: ECOG PERFORMANCE STATUS: {CHL ONC ECOG VW:[id number]}  .There were no vitals filed for this visit. There were no vitals filed for this visit. .There is no height or weight on file to calculate BMI.  GENERAL:alert, in no acute distress and comfortable SKIN: no acute rashes, no significant lesions EYES: conjunctiva are pink and non-injected, sclera anicteric OROPHARYNX: MMM, no exudates, no oropharyngeal erythema or ulceration NECK: supple, no JVD LYMPH:  no palpable lymphadenopathy in the cervical, axillary or inguinal regions LUNGS: clear to auscultation b/l with normal respiratory effort HEART: regular rate & rhythm ABDOMEN:  normoactive bowel sounds , non tender, not distended. Extremity: no pedal edema PSYCH: alert & oriented x 3 with fluent speech NEURO: no focal motor/sensory deficits  LABORATORY DATA:  I have reviewed the data as listed  .    Latest Ref Rng & Units 11/24/2022    9:24 AM 11/21/2022   10:05 AM 11/18/2022    9:45 AM  CBC  WBC 4.0 - 10.5 K/uL 1.8  2.8  3.3   Hemoglobin 12.0 - 15.0 g/dL 8.7  8.3  8.9   Hematocrit 36.0 - 46.0 % 26.6  25.9  27.2   Platelets 150 - 400 K/uL 66  22  13     .    Latest Ref Rng & Units 11/24/2022    9:24 AM 11/21/2022   10:05 AM 11/18/2022    9:45 AM  CMP  Glucose 70 - 99 mg/dL 295  621  308   BUN 6 - 20 mg/dL 10  15  14     Creatinine 0.44 - 1.00 mg/dL 6.57  8.46  9.62   Sodium 135 - 145 mmol/L 139  140  138   Potassium 3.5 - 5.1 mmol/L 3.7  3.6  3.5   Chloride 98 - 111 mmol/L 107  107  104   CO2 22 - 32 mmol/L 25  28  29    Calcium 8.9 - 10.3 mg/dL 9.6  9.4  9.7   Total Protein 6.5 - 8.1 g/dL 7.1  7.0  7.4   Total Bilirubin 0.3 - 1.2 mg/dL 0.4  0.3  0.4   Alkaline Phos 38 - 126 U/L 89  95  100   AST 15 - 41 U/L 24  29  41   ALT 0 - 44 U/L 17  22  27       RADIOGRAPHIC STUDIES: I have personally reviewed the radiological images as listed and agreed with the findings in the report. No results found.  ASSESSMENT & PLAN:  57 y.o. female with  Stage IV Pancreatic adenocarcinoma involving the liver  PLAN:  -reviewed her oncologic medical history in detail which was confirmed by patient -Discussed lab results on 12/09/22 in detail with patient. CBC shows improvement. Results showed WBC of 3.9K, hemoglobin of 9.7, and platelets of 144K. -neutrophils improved from 500 to 2100 -CMP stable -discussed that it would be possible for her blood counts to drop even with dose adjusted Abraxane -continue Abraxane at 100 mg/m2 and Gemcitabine at current dose -she is appropriate to  proceed with cycle 2 day 1 of treatment today -discussed the need to balance between having the most effects from chemotherapy while her blood counts are balanced and she continues to tolerate treatment -will stop blood pressure medication -patient is currently on some medications that may worsen dizziness on top of her anemia. Educated patient that Olanzapine may cause dizziness -recommend moving legs around prior to standing up to prevent dizziness -recommend drinking at least 64 ounces of water daily especially if she is anemic -educated patient that non-caffeinated, non-carbonated, and non-alcoholic drinks would count towards hydration -she shall follow-up with Dr. 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 Follow-up with Dr. Leonides Schanz in 1 week with cycle 2-day 8 of chemotherapy  The total time spent in the appointment was *** minutes* .  All of the patient's questions were answered with apparent satisfaction. The patient knows to call the clinic with any problems, questions or concerns.   Wyvonnia Lora MD MS AAHIVMS [facility] Hematology/Oncology Physician [facility]  .*Total Encounter Time as defined by the Centers for Medicare and Medicaid Services includes, in addition to the face-to-face time of a patient visit (documented in the note above) non-face-to-face time: obtaining and reviewing outside history, ordering and reviewing medications, tests or procedures, care coordination (communications with other health care professionals or caregivers) and documentation in the medical record.    I,Mitra Faeizi,acting as a Neurosurgeon for Wyvonnia Lora, MD.,have documented all relevant documentation on the behalf of Wyvonnia Lora, MD,as directed by  Wyvonnia Lora, MD while in the presence of Wyvonnia Lora, MD.  ***

## 2022-12-14 ENCOUNTER — Encounter: Payer: Self-pay | Admitting: Hematology and Oncology

## 2022-12-16 ENCOUNTER — Inpatient Hospital Stay: Payer: BC Managed Care – PPO

## 2022-12-16 VITALS — BP 119/74 | HR 53 | Temp 98.5°F | Resp 18 | Wt 113.8 lb

## 2022-12-16 DIAGNOSIS — G893 Neoplasm related pain (acute) (chronic): Secondary | ICD-10-CM | POA: Diagnosis not present

## 2022-12-16 DIAGNOSIS — C250 Malignant neoplasm of head of pancreas: Secondary | ICD-10-CM | POA: Diagnosis not present

## 2022-12-16 DIAGNOSIS — C787 Secondary malignant neoplasm of liver and intrahepatic bile duct: Secondary | ICD-10-CM | POA: Diagnosis not present

## 2022-12-16 DIAGNOSIS — M549 Dorsalgia, unspecified: Secondary | ICD-10-CM | POA: Diagnosis not present

## 2022-12-16 DIAGNOSIS — Z95828 Presence of other vascular implants and grafts: Secondary | ICD-10-CM

## 2022-12-16 DIAGNOSIS — R630 Anorexia: Secondary | ICD-10-CM | POA: Diagnosis not present

## 2022-12-16 DIAGNOSIS — Z7901 Long term (current) use of anticoagulants: Secondary | ICD-10-CM | POA: Diagnosis not present

## 2022-12-16 DIAGNOSIS — R7989 Other specified abnormal findings of blood chemistry: Secondary | ICD-10-CM | POA: Diagnosis not present

## 2022-12-16 DIAGNOSIS — C259 Malignant neoplasm of pancreas, unspecified: Secondary | ICD-10-CM

## 2022-12-16 DIAGNOSIS — Z5111 Encounter for antineoplastic chemotherapy: Secondary | ICD-10-CM | POA: Diagnosis not present

## 2022-12-16 DIAGNOSIS — I82621 Acute embolism and thrombosis of deep veins of right upper extremity: Secondary | ICD-10-CM | POA: Diagnosis not present

## 2022-12-16 DIAGNOSIS — I81 Portal vein thrombosis: Secondary | ICD-10-CM | POA: Diagnosis not present

## 2022-12-16 DIAGNOSIS — R112 Nausea with vomiting, unspecified: Secondary | ICD-10-CM | POA: Diagnosis not present

## 2022-12-16 DIAGNOSIS — D649 Anemia, unspecified: Secondary | ICD-10-CM | POA: Diagnosis not present

## 2022-12-16 DIAGNOSIS — Z452 Encounter for adjustment and management of vascular access device: Secondary | ICD-10-CM | POA: Diagnosis not present

## 2022-12-16 LAB — CBC WITH DIFFERENTIAL (CANCER CENTER ONLY)
Abs Immature Granulocytes: 0 10*3/uL (ref 0.00–0.07)
Basophils Absolute: 0 10*3/uL (ref 0.0–0.1)
Basophils Relative: 1 %
Eosinophils Absolute: 0 10*3/uL (ref 0.0–0.5)
Eosinophils Relative: 1 %
HCT: 28.4 % — ABNORMAL LOW (ref 36.0–46.0)
Hemoglobin: 9.2 g/dL — ABNORMAL LOW (ref 12.0–15.0)
Immature Granulocytes: 0 %
Lymphocytes Relative: 81 %
Lymphs Abs: 1.4 10*3/uL (ref 0.7–4.0)
MCH: 30.8 pg (ref 26.0–34.0)
MCHC: 32.4 g/dL (ref 30.0–36.0)
MCV: 95 fL (ref 80.0–100.0)
Monocytes Absolute: 0.1 10*3/uL (ref 0.1–1.0)
Monocytes Relative: 5 %
Neutro Abs: 0.2 10*3/uL — CL (ref 1.7–7.7)
Neutrophils Relative %: 12 %
Platelet Count: 64 10*3/uL — ABNORMAL LOW (ref 150–400)
RBC: 2.99 MIL/uL — ABNORMAL LOW (ref 3.87–5.11)
RDW: 13.4 % (ref 11.5–15.5)
Smear Review: NORMAL
WBC Count: 1.7 10*3/uL — ABNORMAL LOW (ref 4.0–10.5)
nRBC: 0 % (ref 0.0–0.2)

## 2022-12-16 LAB — CMP (CANCER CENTER ONLY)
ALT: 42 U/L (ref 0–44)
AST: 62 U/L — ABNORMAL HIGH (ref 15–41)
Albumin: 4.1 g/dL (ref 3.5–5.0)
Alkaline Phosphatase: 105 U/L (ref 38–126)
Anion gap: 4 — ABNORMAL LOW (ref 5–15)
BUN: 14 mg/dL (ref 6–20)
CO2: 30 mmol/L (ref 22–32)
Calcium: 9.4 mg/dL (ref 8.9–10.3)
Chloride: 105 mmol/L (ref 98–111)
Creatinine: 0.56 mg/dL (ref 0.44–1.00)
GFR, Estimated: >60 mL/min (ref >60)
Glucose, Bld: 122 mg/dL — ABNORMAL HIGH (ref 70–99)
Potassium: 3.5 mmol/L (ref 3.5–5.1)
Sodium: 139 mmol/L (ref 135–145)
Total Bilirubin: 0.3 mg/dL (ref <1.2)
Total Protein: 7.4 g/dL (ref 6.5–8.1)

## 2022-12-16 MED ORDER — HEPARIN SOD (PORK) LOCK FLUSH 100 UNIT/ML IV SOLN
500.0000 [IU] | Freq: Once | INTRAVENOUS | Status: AC
  Administered 2022-12-16: 500 [IU]

## 2022-12-16 MED ORDER — SODIUM CHLORIDE 0.9% FLUSH
10.0000 mL | Freq: Once | INTRAVENOUS | Status: AC
  Administered 2022-12-16: 10 mL

## 2022-12-16 NOTE — Progress Notes (Signed)
Treatment held per Leonides Schanz MD due to cytopenias.

## 2022-12-16 NOTE — Progress Notes (Signed)
CRITICAL VALUE STICKER  CRITICAL VALUE: ANC 0.2  RECEIVER (on-site recipient of call): Morrie Sheldon   DATE & TIME NOTIFIED: 11/8 @ [phone]  MESSENGER (representative from lab): Lab   MD NOTIFIED: Leonides Schanz   TIME OF NOTIFICATION: 4782  RESPONSE: Notified Dr. Beatris Ship

## 2022-12-23 ENCOUNTER — Inpatient Hospital Stay: Payer: BC Managed Care – PPO | Admitting: Dietician

## 2022-12-23 ENCOUNTER — Inpatient Hospital Stay: Payer: BC Managed Care – PPO

## 2022-12-23 ENCOUNTER — Inpatient Hospital Stay (HOSPITAL_BASED_OUTPATIENT_CLINIC_OR_DEPARTMENT_OTHER): Payer: BC Managed Care – PPO | Admitting: Hematology and Oncology

## 2022-12-23 VITALS — BP 125/56 | HR 71 | Temp 98.1°F | Resp 18

## 2022-12-23 VITALS — BP 114/75 | HR 63 | Temp 98.4°F | Resp 13 | Wt 117.7 lb

## 2022-12-23 DIAGNOSIS — D649 Anemia, unspecified: Secondary | ICD-10-CM | POA: Diagnosis not present

## 2022-12-23 DIAGNOSIS — C259 Malignant neoplasm of pancreas, unspecified: Secondary | ICD-10-CM

## 2022-12-23 DIAGNOSIS — Z95828 Presence of other vascular implants and grafts: Secondary | ICD-10-CM

## 2022-12-23 DIAGNOSIS — R7989 Other specified abnormal findings of blood chemistry: Secondary | ICD-10-CM | POA: Diagnosis not present

## 2022-12-23 DIAGNOSIS — Z7901 Long term (current) use of anticoagulants: Secondary | ICD-10-CM | POA: Diagnosis not present

## 2022-12-23 DIAGNOSIS — G893 Neoplasm related pain (acute) (chronic): Secondary | ICD-10-CM | POA: Diagnosis not present

## 2022-12-23 DIAGNOSIS — C787 Secondary malignant neoplasm of liver and intrahepatic bile duct: Secondary | ICD-10-CM | POA: Diagnosis not present

## 2022-12-23 DIAGNOSIS — R630 Anorexia: Secondary | ICD-10-CM | POA: Diagnosis not present

## 2022-12-23 DIAGNOSIS — Z452 Encounter for adjustment and management of vascular access device: Secondary | ICD-10-CM | POA: Diagnosis not present

## 2022-12-23 DIAGNOSIS — Z5111 Encounter for antineoplastic chemotherapy: Secondary | ICD-10-CM

## 2022-12-23 DIAGNOSIS — M549 Dorsalgia, unspecified: Secondary | ICD-10-CM | POA: Diagnosis not present

## 2022-12-23 DIAGNOSIS — R112 Nausea with vomiting, unspecified: Secondary | ICD-10-CM | POA: Diagnosis not present

## 2022-12-23 DIAGNOSIS — I82621 Acute embolism and thrombosis of deep veins of right upper extremity: Secondary | ICD-10-CM | POA: Diagnosis not present

## 2022-12-23 DIAGNOSIS — C250 Malignant neoplasm of head of pancreas: Secondary | ICD-10-CM | POA: Diagnosis not present

## 2022-12-23 DIAGNOSIS — I81 Portal vein thrombosis: Secondary | ICD-10-CM | POA: Diagnosis not present

## 2022-12-23 LAB — CMP (CANCER CENTER ONLY)
ALT: 16 U/L (ref 0–44)
AST: 19 U/L (ref 15–41)
Albumin: 3.6 g/dL (ref 3.5–5.0)
Alkaline Phosphatase: 84 U/L (ref 38–126)
Anion gap: 3 — ABNORMAL LOW (ref 5–15)
BUN: 11 mg/dL (ref 6–20)
CO2: 30 mmol/L (ref 22–32)
Calcium: 9 mg/dL (ref 8.9–10.3)
Chloride: 108 mmol/L (ref 98–111)
Creatinine: 0.51 mg/dL (ref 0.44–1.00)
GFR, Estimated: >60 mL/min (ref >60)
Glucose, Bld: 106 mg/dL — ABNORMAL HIGH (ref 70–99)
Potassium: 3.8 mmol/L (ref 3.5–5.1)
Sodium: 141 mmol/L (ref 135–145)
Total Bilirubin: 0.3 mg/dL (ref <1.2)
Total Protein: 6.6 g/dL (ref 6.5–8.1)

## 2022-12-23 LAB — CBC WITH DIFFERENTIAL (CANCER CENTER ONLY)
Abs Immature Granulocytes: 0.01 10*3/uL (ref 0.00–0.07)
Basophils Absolute: 0 10*3/uL (ref 0.0–0.1)
Basophils Relative: 0 %
Eosinophils Absolute: 0 10*3/uL (ref 0.0–0.5)
Eosinophils Relative: 1 %
HCT: 26.7 % — ABNORMAL LOW (ref 36.0–46.0)
Hemoglobin: 8.8 g/dL — ABNORMAL LOW (ref 12.0–15.0)
Immature Granulocytes: 0 %
Lymphocytes Relative: 31 %
Lymphs Abs: 1.1 10*3/uL (ref 0.7–4.0)
MCH: 31 pg (ref 26.0–34.0)
MCHC: 33 g/dL (ref 30.0–36.0)
MCV: 94 fL (ref 80.0–100.0)
Monocytes Absolute: 0.5 10*3/uL (ref 0.1–1.0)
Monocytes Relative: 15 %
Neutro Abs: 1.8 10*3/uL (ref 1.7–7.7)
Neutrophils Relative %: 53 %
Platelet Count: 100 10*3/uL — ABNORMAL LOW (ref 150–400)
RBC: 2.84 MIL/uL — ABNORMAL LOW (ref 3.87–5.11)
RDW: 14.4 % (ref 11.5–15.5)
WBC Count: 3.5 10*3/uL — ABNORMAL LOW (ref 4.0–10.5)
nRBC: 0 % (ref 0.0–0.2)

## 2022-12-23 MED ORDER — SODIUM CHLORIDE 0.9 % IV SOLN
800.0000 mg/m2 | Freq: Once | INTRAVENOUS | Status: AC
  Administered 2022-12-23: 1216 mg via INTRAVENOUS
  Filled 2022-12-23: qty 31.98

## 2022-12-23 MED ORDER — HEPARIN SOD (PORK) LOCK FLUSH 100 UNIT/ML IV SOLN
500.0000 [IU] | Freq: Once | INTRAVENOUS | Status: AC | PRN
  Administered 2022-12-23: 500 [IU]

## 2022-12-23 MED ORDER — PACLITAXEL PROTEIN-BOUND CHEMO INJECTION 100 MG
100.0000 mg/m2 | Freq: Once | INTRAVENOUS | Status: AC
  Administered 2022-12-23: 150 mg via INTRAVENOUS
  Filled 2022-12-23: qty 30

## 2022-12-23 MED ORDER — ONDANSETRON HCL 4 MG/2ML IJ SOLN
8.0000 mg | Freq: Once | INTRAMUSCULAR | Status: AC
  Administered 2022-12-23: 8 mg via INTRAVENOUS
  Filled 2022-12-23: qty 4

## 2022-12-23 MED ORDER — SODIUM CHLORIDE 0.9% FLUSH
10.0000 mL | INTRAVENOUS | Status: DC | PRN
Start: 2022-12-23 — End: 2022-12-23
  Administered 2022-12-23: 10 mL

## 2022-12-23 MED ORDER — SODIUM CHLORIDE 0.9% FLUSH
10.0000 mL | Freq: Once | INTRAVENOUS | Status: AC
  Administered 2022-12-23: 10 mL

## 2022-12-23 MED ORDER — SODIUM CHLORIDE 0.9 % IV SOLN: Freq: Once | INTRAVENOUS | Status: AC

## 2022-12-23 NOTE — Progress Notes (Signed)
Nutrition Follow-up:  Patient with pancreatic carcinoma metastatic to liver. She is currently receiving gemcitabine/abraxane q28d. Dose reduced secondary to labs.   Met with patient in infusion. She is feeling good today. Appetite has picked up. Recalls bowl of cereal, pimento cheese sandwich, chicken salad, rice, cucumbers yesterday. Patient has not been drinking CIB recently. She got tired of drinking these all the time. Patient is agreeable to add this back in. Husband brought chai tea to patient during visit. She enjoys drinking this. Patient denies nausea, vomiting, diarrhea, constipation.    Medications: reviewed   Labs: reviewed   Anthropometrics: Wt 117 lb 11.2 oz today increased from 113 lb 12.8 oz on 11/8  10/17 - 119 lb 11.2 oz  9/16 - 120 lb    NUTRITION DIAGNOSIS: Unintentional wt loss ongoing, but stable at this time   INTERVENTION:  Continue strategies for increasing calories and protein with small frequent meals/snacks Encouraged high calorie high protein foods Suggested trying Tazo Dorena Dew Tea concentrate and adding milk for high calorie supplement Pt will restart CIB mixed with fairlife milk 1-2/day    MONITORING, EVALUATION, GOAL: wt trends, intake   NEXT VISIT: To be scheduled in collaboration with upcoming [facility] infusion schedule

## 2022-12-23 NOTE — Patient Instructions (Signed)
 [facility] - A DEPT OF [facility]  Discharge Instructions: Thank you for choosing [facility] to provide your oncology and hematology care.   If you have a lab appointment with the Cancer Center, please go directly to the Cancer Center and check in at the registration area.   Wear comfortable clothing and clothing appropriate for easy access to any Portacath or PICC line.   We strive to give you quality time with your provider. You may need to reschedule your appointment if you arrive late (15 or more minutes).  Arriving late affects you and other patients whose appointments are after yours.  Also, if you miss three or more appointments without notifying the office, you may be dismissed from the clinic at the provider's discretion.      For prescription refill requests, have your pharmacy contact our office and allow 72 hours for refills to be completed.    Today you received the following chemotherapy and/or immunotherapy agents Abraxane / Gemzar      To help prevent nausea and vomiting after your treatment, we encourage you to take your nausea medication as directed.  BELOW ARE SYMPTOMS THAT SHOULD BE REPORTED IMMEDIATELY: *FEVER GREATER THAN 100.4 F (38 C) OR HIGHER *CHILLS OR SWEATING *NAUSEA AND VOMITING THAT IS NOT CONTROLLED WITH YOUR NAUSEA MEDICATION *UNUSUAL SHORTNESS OF BREATH *UNUSUAL BRUISING OR BLEEDING *URINARY PROBLEMS (pain or burning when urinating, or frequent urination) *BOWEL PROBLEMS (unusual diarrhea, constipation, pain near the anus) TENDERNESS IN MOUTH AND THROAT WITH OR WITHOUT PRESENCE OF ULCERS (sore throat, sores in mouth, or a toothache) UNUSUAL RASH, SWELLING OR PAIN  UNUSUAL VAGINAL DISCHARGE OR ITCHING   Items with * indicate a potential emergency and should be followed up as soon as possible or go to the Emergency Department if any problems should occur.  Please show the CHEMOTHERAPY ALERT CARD or  IMMUNOTHERAPY ALERT CARD at check-in to the Emergency Department and triage nurse.  Should you have questions after your visit or need to cancel or reschedule your appointment, please contact [facility] - A DEPT OF [facility]  Dept: [phone]  and follow the prompts.  Office hours are 8:00 a.m. to 4:30 p.m. Monday - Friday. Please note that voicemails left after 4:00 p.m. may not be returned until the following business day.  We are closed weekends and major holidays. You have access to a nurse at all times for urgent questions. Please call the main number to the clinic Dept: [phone] and follow the prompts.   For any non-urgent questions, you may also contact your provider using MyChart. We now offer e-Visits for anyone 52 and older to request care online for non-urgent symptoms. For details visit [email or web address].   Also download the MyChart app! Go to the app store, search "MyChart", open the app, select , and log in with your MyChart username and password.

## 2022-12-23 NOTE — Progress Notes (Signed)
[facility] [phone]   [phone]  PROGRESS NOTE:  Patient Care Team: Bernadette Hoit, MD as PCP - General (Family Medicine) Reece Packer, MD as Consulting Physician (Medical Oncology) Pickenpack-Cousar, Arty Baumgartner, NP as Nurse Practitioner (Nurse Practitioner)  CHIEF COMPLAINTS/PURPOSE OF CONSULTATION:  Metastatic pancreatic adenocarcinoma involving the liver  ONCOLOGIC HISTORY: Presented with nausea and right upper quadrant pain 03/08/2022: Abdominal US showed multiple hypoechoic masses within the liver are indeterminate.  03/10/2022: MR abdomen: Hypoenhancing mass compatible with pancreatic head adenocarcinoma with numerous targetoid enhancing masses in all segments of the liver, compatible with metastatic disease. The mass severely effaces and nearly occludes the portal vein and splenic vein, and occludes the superior mesenteric vein with some collaterals noted. The mass substantially abuts the proximal transverse duodenum, although overt duodenal invasion is indeterminate. 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 Prominent stool throughout the colon favors constipation. Fluid-fluid level in the gallbladder likely from sludge. 03/14/2022: Establish care with [facility] Hematology/Oncology 03/17/2022: CT chest: no definitive evidence of lung metastases.  03/22/2022: Liver biopsy confirmed metastatic adenocarcinoma, pancreatic primary.  03/30/2022: Cycle 1, Day 1 of mFOLFIRINOX 04/14/2022: Cycle 2, Day 1 of mFOLFIRINOX HELD due to ANC 300.  04/20/2022: Cycle 2, Day 1 of mFOLFIRINOX 05/04/2022: Cycle 3, Day 1 of  mFOLFIRINOX 05/19/2022: Cycle 4, Day 1 of mFOLFIRINOX HELD per patient request.  07/21/2022: Cycle 4, Day 1 of mFOLFIRINOX 08/04/2022: Cycle 5, Day 1 of mFOLFIRINOX HELD due to Plt 81K 08/17/2022: Cycle 5, Day 1 of mFOLFIRINOX 09/01/2022: Cycle 6, Day 1 of mFOLFIRINOX 09/14/2022: Cycle 7, Day 1 of mFOLFIRINOX HELD due to Plt 31K 09/22/2022: Cycle 7, Day 1 of mFOLFIRINOX HELD due to Plt 73K 10/20/2022 CT C/A/P showed mixed picture with progression of disease. FOLFIRINOX d/c.  11/10/2022: Cycle 1 Day 1 of Gemcitabine/Abraxane.  11/24/2022: treatment held due to cytopenias.  12/23/2022: Cycle 2 Day 8 of Gemcitabine (reduced to 800 mg /m2) and Abraxane (reduced to 100 mg)    HISTORY OF PRESENTING ILLNESS:  Kaitlin Ferrell presents today for a follow up. She was last seen on 11/10/2022.  Patient presents today prior to Cycle 2 Day 8 of Gem/Abraxane.   Ms. Hambel reports she feels good overall and interim since her last visit.  She reports that her stomach was hurting the other day but that she took some Senokot and with relief of her bowels felt better.  She reports her energy today is about a 9 out of 10.  She reports she is not having any nausea or vomiting.  She feels like her appetite is getting better and her weight has increased by 4 pounds.  She notes that she did have a headache yesterday but resolved with Tylenol.  She has not had any overt signs of bleeding or bruising.  She reports that she does feel like the nodules in her abdomen are about the same size.  She did develop some smaller nodules on her lower extremities.  She reports they are not painful.  Overall she is willing and able to proceed with treatment today.  We discussed dose reduction and the difficulty maintaining her blood counts.  She voiced her understanding.  She reports that she is not also having any nausea, vomiting, or diarrhea.  She denies fevers, chills, sweats, shortness of breath, chest pain or cough. She has no other  complaints. Rest of the 10 point ROS is below.    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   treated with 4 cycles of actinomycin D from 12-02-11 thru 01-13-12   H/O molar pregnancy, antepartum    Hypertension    Migraine    Miscarriage     SURGICAL HISTORY: Past Surgical History:  Procedure Laterality Date   BREAST BIOPSY  2001   Left   BREAST LUMPECTOMY     DILATION AND CURETTAGE OF UTERUS  1992, 09/2011   DILATION AND EVACUATION  2011   INSERTION OF MESH N/A 09/03/2013   Procedure: INSERTION OF MESH;  Surgeon: Ardeth Sportsman, MD;  Location: [facility];  Service: General;  Laterality: N/A;   IR CHEST FLUORO  04/14/2022   IR IMAGING GUIDED PORT INSERTION  03/22/2022   IR IMAGING GUIDED PORT INSERTION  05/02/2022   IR PORT REPAIR CENTRAL VENOUS ACCESS DEVICE  04/19/2022   IR REMOVAL TUN CV CATH W/O FL  05/02/2022   IR US LIVER BIOPSY  03/22/2022   PORTACATH PLACEMENT  11/2011   PORTACATH PLACEMENT     REMOVAL  MARCH 2014   VENTRAL HERNIA REPAIR N/A 09/03/2013   Procedure: LAPAROSCOPIC VENTRAL WALL HERNIA REPAIR;  Surgeon: Ardeth Sportsman, MD;  Location: [facility];  Service: General;  Laterality: N/A;   WISDOM TOOTH EXTRACTION     in 11th grade    SOCIAL HISTORY: Social History   Socioeconomic History   Marital status: Married    Spouse name: [name]   Number of children: 2   Years of education: college   Highest education level: Not on file  Occupational History    Comment: [employer]  Tobacco Use   Smoking status: Never   Smokeless tobacco: Never  Substance and Sexual Activity   Alcohol use: Yes    Alcohol/week: 1.0 standard drink of alcohol    Types: 1 Glasses of wine per week    Comment: occas   Drug use: No   Sexual activity: Not Currently    Birth control/protection: Pill   Other Topics Concern   Not on file  Social History Narrative   Patient is married [name]). Patient works part time at [employer].   Right handed.   Patient has her CMA.   Caffeine- None         Social Determinants of Health   Financial Resource Strain: Not on file  Food Insecurity: No Food Insecurity (05/10/2022)   Hunger Vital Sign    Worried About Running Out of Food in the Last Year: Never true    Ran Out of Food in the Last Year: Never true  Transportation Needs: No Transportation Needs (05/10/2022)   PRAPARE - Administrator, Civil Service (Medical): No    Lack of Transportation (Non-Medical): No  Physical Activity: Not on file  Stress: Not on file  Social Connections: Not on file  Intimate Partner Violence: Not At Risk (05/10/2022)   Humiliation, Afraid, Rape, and Kick questionnaire    Fear of Current or Ex-Partner: No    Emotionally Abused: No  Physically Abused: No    Sexually Abused: No    FAMILY HISTORY: Family History  Problem Relation Age of Onset   Breast cancer Mother        dx > 50   Heart attack Mother    Diabetes Father    Breast cancer Maternal Aunt        dx > 50   Breast cancer Maternal Aunt        dx > 50   Breast cancer Maternal Grandmother        ? < 50   Stroke Paternal Grandmother    Alcoholism Other     ALLERGIES:  is allergic to compazine [prochlorperazine edisylate], propofol, oxycodone-acetaminophen, prochlorperazine, and labetalol.  MEDICATIONS:  Current Outpatient Medications  Medication Sig Dispense Refill   amLODipine (NORVASC) 10 MG tablet Take 1 tablet by mouth daily.     apixaban (ELIQUIS) 5 MG TABS tablet Take 1 tablet (5 mg total) by mouth 2 (two) times daily. 60 tablet 2   Cholecalciferol 1.25 MG (50000 UT) capsule Take 50,000 Units by mouth once a week.     Cyanocobalamin (VITAMIN B-12 CR PO) Take by mouth.     diphenoxylate-atropine (LOMOTIL) 2.5-0.025 MG tablet Take 1 tablet by mouth 4 (four) times daily as  needed for diarrhea or loose stools. 30 tablet 0   dronabinol (MARINOL) 2.5 MG capsule Take 1 capsule (2.5 mg total) by mouth 2 (two) times daily before a meal. 60 capsule 0   ELDERBERRY PO Take by mouth.     eletriptan (RELPAX) 40 MG tablet Take 1 tablet (40 mg total) by mouth as needed for migraine or headache. May repeat in 2 hours if needed. 15 tablet 6   FIBER ADULT GUMMIES PO Take by mouth.     Folic Acid (FOLATE PO) Place 1 mg under the tongue daily.     hydrochlorothiazide (MICROZIDE) 12.5 MG capsule Take 12.5 mg by mouth daily as needed (swelling).     lidocaine-prilocaine (EMLA) cream Apply 1 Application topically as needed. 30 g 2   LORazepam (ATIVAN) 0.5 MG tablet Take 0.5 mg by mouth as needed for anxiety.     magic mouthwash (multi-ingredient) oral suspension Swish and swallow 5 mLs by mouth 4 times daily as needed for throat discomfort 400 mL 1   magnesium oxide (MAG-OX) 400 (241.3 MG) MG tablet TAKE 1 TABLET BY MOUTH TWICE A DAY 60 tablet 6   morphine (MS CONTIN) 30 MG 12 hr tablet Take 1 tablet (30 mg total) by mouth every 8 (eight) hours. 90 tablet 0   morphine (MSIR) 15 MG tablet Take 1 tablet (15 mg total) by mouth every 6 (six) hours as needed for severe pain or moderate pain. 45 tablet 0   OLANZapine (ZYPREXA) 10 MG tablet TAKE 1 TABLET BY MOUTH EVERYDAY AT BEDTIME 90 tablet 1   ondansetron (ZOFRAN) 8 MG tablet Take 1 tablet (8 mg total) by mouth every 8 (eight) hours as needed for nausea or vomiting. 90 tablet 0   ondansetron (ZOFRAN-ODT) 8 MG disintegrating tablet Take 1 tablet (8 mg total) by mouth every 8 (eight) hours as needed for nausea or vomiting. 20 tablet 0   promethazine (PHENERGAN) 25 MG tablet Take 1 tablet (25 mg total) by mouth every 6 (six) hours as needed for nausea or vomiting. 30 tablet 0   scopolamine (TRANSDERM-SCOP) 1 MG/3DAYS Place 1 patch (1.5 mg total) onto the skin every 3 (three) days. 10 patch 12   No current  facility-administered medications for  this visit.    REVIEW OF SYSTEMS:   Constitutional: ( - ) fevers, ( - )  chills , ( - ) night sweats Eyes: ( - ) blurriness of vision, ( - ) double vision, ( - ) watery eyes Ears, nose, mouth, throat, and face: ( - ) mucositis, ( - ) sore throat Respiratory: ( - ) cough, ( - ) dyspnea, ( - ) wheezes Cardiovascular: ( - ) palpitation, ( - ) chest discomfort, ( - ) lower extremity swelling Gastrointestinal:  (+) nausea, ( - ) heartburn, ( -) change in bowel habits Skin: ( - ) abnormal skin rashes Lymphatics: ( - ) new lymphadenopathy, ( + ) easy bruising Neurological: ( - ) numbness, ( - ) tingling, ( - ) new weaknesses Behavioral/Psych: ( - ) mood change, ( - ) new changes  All other systems were reviewed with the patient and are negative.  PHYSICAL EXAMINATION: ECOG PERFORMANCE STATUS: 1 - Symptomatic but completely ambulatory  Vitals:   12/23/22 0959  BP: 114/75  Pulse: 63  Resp: 13  Temp: 98.4 F (36.9 C)  SpO2: 99%   Filed Weights   12/23/22 0959  Weight: 117 lb 11.2 oz (53.4 kg)    GENERAL: well appearing female in NAD  SKIN: skin color, texture, turgor are normal, no rashes or significant lesions. Mild bruising with nodularity involving bilateral upper and lower extremities.  EYES: conjunctiva are pink and non-injected, sclera clear LUNGS: clear to auscultation and percussion with normal breathing effort HEART: regular rate & rhythm and no murmurs and no lower extremity edema Musculoskeletal: no cyanosis of digits and no clubbing  PSYCH: alert & oriented x 3, fluent speech NEURO: no focal motor/sensory deficits  LABORATORY DATA:  I have reviewed the data as listed    Latest Ref Rng & Units 12/23/2022    9:37 AM 12/16/2022    8:12 AM 12/09/2022   10:26 AM  CBC  WBC 4.0 - 10.5 K/uL 3.5  1.7  3.9   Hemoglobin 12.0 - 15.0 g/dL 8.8  9.2  9.7   Hematocrit 36.0 - 46.0 % 26.7  28.4  30.5   Platelets 150 - 400 K/uL 100  64  144        Latest Ref Rng & Units  12/23/2022    9:37 AM 12/16/2022    8:12 AM 12/09/2022   10:26 AM  CMP  Glucose 70 - 99 mg/dL 016  010  932   BUN 6 - 20 mg/dL 11  14  13    Creatinine 0.44 - 1.00 mg/dL 3.55  7.32  2.02   Sodium 135 - 145 mmol/L 141  139  139   Potassium 3.5 - 5.1 mmol/L 3.8  3.5  3.7   Chloride 98 - 111 mmol/L 108  105  108   CO2 22 - 32 mmol/L 30  30  27    Calcium 8.9 - 10.3 mg/dL 9.0  9.4  9.1   Total Protein 6.5 - 8.1 g/dL 6.6  7.4  7.2   Total Bilirubin <1.2 mg/dL 0.3  0.3  0.4   Alkaline Phos 38 - 126 U/L 84  105  94   AST 15 - 41 U/L 19  62  24   ALT 0 - 44 U/L 16  42  18      RADIOGRAPHIC STUDIES: I have personally reviewed the radiological images as listed and agreed with the findings in the report. No results found.  ASSESSMENT &  PLAN Joycee Rexanne Sadoff is a 57 y.o. female who returns to the clinic for newly diagnosed pancreatic adenocarcinoma.    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 Dr. Leonides Schanz recommends chemotherapy regimen with FOLFIRINOX q 2 weeks.  --Plan to obtain restaging CT scans every 3 months to assess treatment response.  --Foundation One Testing: MS-stable, 2 Muts/Mb, ATMH638fs*31, KRASG12L, MDM2 amplification, CDKN2A/B. No targetable mutations.  --Started mFOLFIRINOX on 03/30/2022 --After Cycle 3 on 05/04/2022, patient was on a treatment break due to poor tolerance and patient's request. --Most recent CT CAP from 06/30/2022 showed treatment response. --Patient resumed FOLFIRINOX without dose modification on 07/21/2022  --progression noted on CT scan from 10/20/2022, d/c FOLFIRINOX.  PLAN: --Due for Cycle 2, Day 8 Gem/Abraxane --Labs from today reviewed with patient.  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  --Currently on Eliquis 5 mg BID  #Neutropenia --Neutropenic precautions given to patient including monitoring for fevers.   #Elevated LFTs --Most likely secondary to chemotherapy. LFTs today within normal limits  --Monitor for now  #Nausea/Vomiting: --Secondary to chemotherapy and well controlled.  -- Continue to take scopolamine patch, phenergan 25 mg q 6 hours, zofran 8mg  q8H PRN for nausea, zyprexa nightly PRN for nausea.   #Epigastric/RUQ/back pain: --Likely secondary to underlying malignancy --Tramadol was ineffective.  --Current pain regimen includes MS contin to 30 mg q 8 hours and Norco 10-325 mg.   #Appetite loss/weight loss: --Weight has improved since last week from 116 to 119 lbs today.  --Prescribed Marinol but is not currently taking it. --Encouraged to eat small, frequent meals and supplement with protein shakes --Follows with [facility] nutrition team  #Family history of breast cancer: --Due to strong family history of cancer and has undergone genetic testing in the past.  --Underwent genetic testing on 04/07/2022 that was negative. 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   #Supportive Care -- chemotherapy education to be scheduled  -- port placement complete  -- EMLA cream for port  Orders Placed This Encounter  Procedures   CT CHEST ABDOMEN PELVIS W CONTRAST    Standing Status:   Future    Standing Expiration Date:   12/23/2023    Order Specific Question:   If indicated for the ordered procedure, I authorize the  administration of contrast media per Radiology protocol    Answer:   Yes    Order Specific Question:   Does the patient have a contrast media/X-ray dye allergy?    Answer:   No    Order Specific Question:   Preferred imaging location?    Answer:   [facility]    Order Specific Question:   If indicated for the ordered procedure, I authorize the administration of oral contrast media per Radiology protocol    Answer:   Yes   CBC with Differential (Cancer Center Only)    Standing Status:  Future    Standing Expiration Date:   01/07/2024   CMP (Cancer Center only)    Standing Status:   Future    Standing Expiration Date:   01/07/2024   CBC with Differential (Cancer Center Only)    Standing Status:   Future    Standing Expiration Date:   01/14/2024   CMP (Cancer Center only)    Standing Status:   Future    Standing Expiration Date:   01/14/2024   CBC with Differential (Cancer Center Only)    Standing Status:   Future    Standing Expiration Date:   01/21/2024   CMP (Cancer Center only)    Standing Status:   Future    Standing Expiration Date:   01/21/2024   CBC with Differential (Cancer Center Only)    Standing Status:   Future    Standing Expiration Date:   02/04/2024   CMP (Cancer Center only)    Standing Status:   Future    Standing Expiration Date:   02/04/2024   CBC with Differential (Cancer Center Only)    Standing Status:   Future    Standing Expiration Date:   02/11/2024   CMP (Cancer Center only)    Standing Status:   Future    Standing Expiration Date:   02/11/2024   CBC with Differential (Cancer Center Only)    Standing Status:   Future    Standing Expiration Date:   02/18/2024   CMP (Cancer Center only)    Standing Status:   Future    Standing Expiration Date:   02/18/2024   All questions were answered. The patient knows to call the clinic with any problems, questions or concerns.  I have spent a total of 30 minutes minutes of face-to-face and non-face-to-face  time, preparing to see the patient,  performing a medically appropriate examination, counseling and educating the patient, documenting clinical information in the electronic health record,  and care coordination.   Ulysees Barns, MD Department of Hematology/Oncology [facility] Phone: [phone] Pager: [phone] Email: [email or web address]

## 2022-12-26 ENCOUNTER — Other Ambulatory Visit: Payer: Self-pay | Admitting: *Deleted

## 2023-01-04 ENCOUNTER — Inpatient Hospital Stay: Payer: BC Managed Care – PPO

## 2023-01-04 DIAGNOSIS — Z452 Encounter for adjustment and management of vascular access device: Secondary | ICD-10-CM | POA: Diagnosis not present

## 2023-01-04 DIAGNOSIS — I81 Portal vein thrombosis: Secondary | ICD-10-CM | POA: Diagnosis not present

## 2023-01-04 DIAGNOSIS — C787 Secondary malignant neoplasm of liver and intrahepatic bile duct: Secondary | ICD-10-CM | POA: Diagnosis not present

## 2023-01-04 DIAGNOSIS — G893 Neoplasm related pain (acute) (chronic): Secondary | ICD-10-CM | POA: Diagnosis not present

## 2023-01-04 DIAGNOSIS — Z7901 Long term (current) use of anticoagulants: Secondary | ICD-10-CM | POA: Diagnosis not present

## 2023-01-04 DIAGNOSIS — Z5111 Encounter for antineoplastic chemotherapy: Secondary | ICD-10-CM | POA: Diagnosis not present

## 2023-01-04 DIAGNOSIS — C250 Malignant neoplasm of head of pancreas: Secondary | ICD-10-CM | POA: Diagnosis not present

## 2023-01-04 DIAGNOSIS — M549 Dorsalgia, unspecified: Secondary | ICD-10-CM | POA: Diagnosis not present

## 2023-01-04 DIAGNOSIS — R7989 Other specified abnormal findings of blood chemistry: Secondary | ICD-10-CM | POA: Diagnosis not present

## 2023-01-04 DIAGNOSIS — R112 Nausea with vomiting, unspecified: Secondary | ICD-10-CM | POA: Diagnosis not present

## 2023-01-04 DIAGNOSIS — R630 Anorexia: Secondary | ICD-10-CM | POA: Diagnosis not present

## 2023-01-04 DIAGNOSIS — D649 Anemia, unspecified: Secondary | ICD-10-CM | POA: Diagnosis not present

## 2023-01-04 DIAGNOSIS — C259 Malignant neoplasm of pancreas, unspecified: Secondary | ICD-10-CM

## 2023-01-04 DIAGNOSIS — I82621 Acute embolism and thrombosis of deep veins of right upper extremity: Secondary | ICD-10-CM | POA: Diagnosis not present

## 2023-01-04 DIAGNOSIS — Z95828 Presence of other vascular implants and grafts: Secondary | ICD-10-CM

## 2023-01-04 LAB — CMP (CANCER CENTER ONLY)
ALT: 24 U/L (ref 0–44)
AST: 21 U/L (ref 15–41)
Albumin: 3.6 g/dL (ref 3.5–5.0)
Alkaline Phosphatase: 108 U/L (ref 38–126)
Anion gap: 5 (ref 5–15)
BUN: 9 mg/dL (ref 6–20)
CO2: 28 mmol/L (ref 22–32)
Calcium: 9.1 mg/dL (ref 8.9–10.3)
Chloride: 107 mmol/L (ref 98–111)
Creatinine: 0.53 mg/dL (ref 0.44–1.00)
GFR, Estimated: >60 mL/min (ref >60)
Glucose, Bld: 97 mg/dL (ref 70–99)
Potassium: 3.6 mmol/L (ref 3.5–5.1)
Sodium: 140 mmol/L (ref 135–145)
Total Bilirubin: 0.3 mg/dL (ref <1.2)
Total Protein: 6.8 g/dL (ref 6.5–8.1)

## 2023-01-04 LAB — CBC WITH DIFFERENTIAL (CANCER CENTER ONLY)
Abs Immature Granulocytes: 0.01 10*3/uL (ref 0.00–0.07)
Basophils Absolute: 0 10*3/uL (ref 0.0–0.1)
Basophils Relative: 0 %
Eosinophils Absolute: 0 10*3/uL (ref 0.0–0.5)
Eosinophils Relative: 1 %
HCT: 28.8 % — ABNORMAL LOW (ref 36.0–46.0)
Hemoglobin: 9.1 g/dL — ABNORMAL LOW (ref 12.0–15.0)
Immature Granulocytes: 0 %
Lymphocytes Relative: 37 %
Lymphs Abs: 1.1 10*3/uL (ref 0.7–4.0)
MCH: 29.8 pg (ref 26.0–34.0)
MCHC: 31.6 g/dL (ref 30.0–36.0)
MCV: 94.4 fL (ref 80.0–100.0)
Monocytes Absolute: 0.5 10*3/uL (ref 0.1–1.0)
Monocytes Relative: 16 %
Neutro Abs: 1.4 10*3/uL — ABNORMAL LOW (ref 1.7–7.7)
Neutrophils Relative %: 46 %
Platelet Count: 89 10*3/uL — ABNORMAL LOW (ref 150–400)
RBC: 3.05 MIL/uL — ABNORMAL LOW (ref 3.87–5.11)
RDW: 14.5 % (ref 11.5–15.5)
WBC Count: 3 10*3/uL — ABNORMAL LOW (ref 4.0–10.5)
nRBC: 0 % (ref 0.0–0.2)

## 2023-01-04 MED ORDER — HEPARIN SOD (PORK) LOCK FLUSH 100 UNIT/ML IV SOLN
500.0000 [IU] | Freq: Once | INTRAVENOUS | Status: AC
Start: 2023-01-04 — End: 2023-01-04
  Administered 2023-01-04: 500 [IU]

## 2023-01-04 MED ORDER — SODIUM CHLORIDE 0.9% FLUSH
10.0000 mL | Freq: Once | INTRAVENOUS | Status: AC
  Administered 2023-01-04: 10 mL

## 2023-01-06 ENCOUNTER — Inpatient Hospital Stay: Payer: BC Managed Care – PPO

## 2023-01-06 NOTE — Progress Notes (Signed)
Per Mosetta Putt MD, no tx today due to PLT 89 and ANC 1.4. Wiley RN went to speak with pt who had already arrived for tx.

## 2023-01-13 ENCOUNTER — Inpatient Hospital Stay: Payer: BC Managed Care – PPO

## 2023-01-13 ENCOUNTER — Other Ambulatory Visit: Payer: Self-pay

## 2023-01-13 ENCOUNTER — Inpatient Hospital Stay: Payer: BC Managed Care – PPO | Attending: Physician Assistant

## 2023-01-13 DIAGNOSIS — Z95828 Presence of other vascular implants and grafts: Secondary | ICD-10-CM

## 2023-01-13 DIAGNOSIS — Z452 Encounter for adjustment and management of vascular access device: Secondary | ICD-10-CM | POA: Diagnosis not present

## 2023-01-13 DIAGNOSIS — Z515 Encounter for palliative care: Secondary | ICD-10-CM

## 2023-01-13 DIAGNOSIS — Z5189 Encounter for other specified aftercare: Secondary | ICD-10-CM | POA: Insufficient documentation

## 2023-01-13 DIAGNOSIS — R7989 Other specified abnormal findings of blood chemistry: Secondary | ICD-10-CM | POA: Insufficient documentation

## 2023-01-13 DIAGNOSIS — Z5111 Encounter for antineoplastic chemotherapy: Secondary | ICD-10-CM | POA: Diagnosis not present

## 2023-01-13 DIAGNOSIS — C250 Malignant neoplasm of head of pancreas: Secondary | ICD-10-CM | POA: Insufficient documentation

## 2023-01-13 DIAGNOSIS — D709 Neutropenia, unspecified: Secondary | ICD-10-CM | POA: Diagnosis not present

## 2023-01-13 DIAGNOSIS — C787 Secondary malignant neoplasm of liver and intrahepatic bile duct: Secondary | ICD-10-CM | POA: Diagnosis not present

## 2023-01-13 DIAGNOSIS — D61818 Other pancytopenia: Secondary | ICD-10-CM | POA: Insufficient documentation

## 2023-01-13 DIAGNOSIS — C259 Malignant neoplasm of pancreas, unspecified: Secondary | ICD-10-CM

## 2023-01-13 DIAGNOSIS — G893 Neoplasm related pain (acute) (chronic): Secondary | ICD-10-CM

## 2023-01-13 LAB — CBC WITH DIFFERENTIAL (CANCER CENTER ONLY)
Abs Immature Granulocytes: 0 10*3/uL (ref 0.00–0.07)
Basophils Absolute: 0 10*3/uL (ref 0.0–0.1)
Basophils Relative: 1 %
Eosinophils Absolute: 0.1 10*3/uL (ref 0.0–0.5)
Eosinophils Relative: 2 %
HCT: 28 % — ABNORMAL LOW (ref 36.0–46.0)
Hemoglobin: 8.9 g/dL — ABNORMAL LOW (ref 12.0–15.0)
Immature Granulocytes: 0 %
Lymphocytes Relative: 49 %
Lymphs Abs: 1.6 10*3/uL (ref 0.7–4.0)
MCH: 30.2 pg (ref 26.0–34.0)
MCHC: 31.8 g/dL (ref 30.0–36.0)
MCV: 94.9 fL (ref 80.0–100.0)
Monocytes Absolute: 0.6 10*3/uL (ref 0.1–1.0)
Monocytes Relative: 18 %
Neutro Abs: 1 10*3/uL — ABNORMAL LOW (ref 1.7–7.7)
Neutrophils Relative %: 30 %
Platelet Count: 161 10*3/uL (ref 150–400)
RBC: 2.95 MIL/uL — ABNORMAL LOW (ref 3.87–5.11)
RDW: 15 % (ref 11.5–15.5)
WBC Count: 3.2 10*3/uL — ABNORMAL LOW (ref 4.0–10.5)
nRBC: 0 % (ref 0.0–0.2)

## 2023-01-13 LAB — CMP (CANCER CENTER ONLY)
ALT: 19 U/L (ref 0–44)
AST: 24 U/L (ref 15–41)
Albumin: 3.8 g/dL (ref 3.5–5.0)
Alkaline Phosphatase: 98 U/L (ref 38–126)
Anion gap: 7 (ref 5–15)
BUN: 14 mg/dL (ref 6–20)
CO2: 28 mmol/L (ref 22–32)
Calcium: 9.1 mg/dL (ref 8.9–10.3)
Chloride: 106 mmol/L (ref 98–111)
Creatinine: 0.55 mg/dL (ref 0.44–1.00)
GFR, Estimated: >60 mL/min (ref >60)
Glucose, Bld: 104 mg/dL — ABNORMAL HIGH (ref 70–99)
Potassium: 3.6 mmol/L (ref 3.5–5.1)
Sodium: 141 mmol/L (ref 135–145)
Total Bilirubin: 0.3 mg/dL (ref <1.2)
Total Protein: 6.7 g/dL (ref 6.5–8.1)

## 2023-01-13 MED ORDER — SODIUM CHLORIDE 0.9% FLUSH
10.0000 mL | Freq: Once | INTRAVENOUS | Status: AC
  Administered 2023-01-13: 10 mL

## 2023-01-13 MED ORDER — HEPARIN SOD (PORK) LOCK FLUSH 100 UNIT/ML IV SOLN
500.0000 [IU] | Freq: Once | INTRAVENOUS | Status: AC
  Administered 2023-01-13: 500 [IU]

## 2023-01-13 MED ORDER — MORPHINE SULFATE ER 30 MG PO TBCR: 30.0000 mg | EXTENDED_RELEASE_TABLET | Freq: Three times a day (TID) | ORAL | 0 refills | Status: DC

## 2023-01-13 NOTE — Progress Notes (Signed)
Per Dr. Candise Che, holding today's treatment due to ANC 1.0. Dr. Derek Mound nurse made aware. Patient has appointment to see Dr. Leonides Schanz next week 12/12.

## 2023-01-13 NOTE — Telephone Encounter (Signed)
Pt called for med refill, see associated orders.

## 2023-01-18 NOTE — Assessment & Plan Note (Addendum)
--  Liver biopsy on 03/22/2022 confirmed metastatic adenocarcinoma --Due to metastatic involvement in the liver, patient is not a candidate for curative therapies including surgery --Discussed mainstay treatment will be chemotherapy. Dr. Leonides Schanz recommends chemotherapy regimen with FOLFIRINOX q 2 weeks.  --Plan to obtain restaging CT scans every 3 months to assess treatment response.  --Foundation One Testing: MS-stable, 2 Muts/Mb, ATMH648fs*31, KRASG12L, MDM2 amplification, CDKN2A/B. No targetable mutations.  --Started mFOLFIRINOX on 03/30/2022 --After Cycle 3 on 05/04/2022, patient was on a treatment break due to poor tolerance and patient's request. --Most recent CT CAP from 06/30/2022 showed treatment response. --Patient resumed FOLFIRINOX without dose modification on 07/21/2022  --progression noted on CT scan from 10/20/2022, d/c FOLFIRINOX.  Start gemcitabine and Abraxane. PLAN: --Due for Cycle 3 day 15 gem/abraxane --Labs from today reviewed with patient.  White blood cell 3.5, hemoglobin 8.8, platelets 100 -- Will drop dose of Abraxane down to 100 mg/m and Gemcitabine down to 800 mg/m2 -- Treatment held on 01/04/2023 and again 01/13/2023 due to significant cytopenias. --labs have rebounded robustly. She will proceed with treatment today as scheduled.  --will recheck labs and follow up with Dr. Leonides Schanz as scheduled 02/03/2023.  --CT CAP scheduled on 01/20/2023.

## 2023-01-18 NOTE — Progress Notes (Unsigned)
Patient Care Team: Kaitlin Hoit, MD as PCP - General (Family Medicine) Reece Packer, MD as Consulting Physician (Medical Oncology) Pickenpack-Cousar, Arty Baumgartner, NP as Nurse Practitioner (Nurse Practitioner)  Clinic Day:  01/19/2023  Referring physician: Bernadette Hoit, MD  ASSESSMENT & PLAN:   Assessment & Plan: Pancreatic carcinoma metastatic to liver [facility]) --Liver biopsy on 03/22/2022 confirmed metastatic adenocarcinoma --Due to metastatic involvement in the liver, patient is not a candidate for curative therapies including surgery --Discussed mainstay treatment will be chemotherapy. Dr. Leonides Schanz recommends chemotherapy regimen with FOLFIRINOX q 2 weeks.  --Plan to obtain restaging CT scans every 3 months to assess treatment response.  --[facility] Testing: MS-stable, 2 Muts/Mb, ATMH661fs*31, KRASG12L, MDM2 amplification, CDKN2A/B. No targetable mutations.  --Started mFOLFIRINOX on 03/30/2022 --After Cycle 3 on 05/04/2022, patient was on a treatment break due to poor tolerance and patient's request. --Most recent CT CAP from 06/30/2022 showed treatment response. --Patient resumed FOLFIRINOX without dose modification on 07/21/2022  --progression noted on CT scan from 10/20/2022, d/c FOLFIRINOX.  Start gemcitabine and Abraxane. PLAN: --Due for Cycle 3 day 15 gem/abraxane --Labs from today reviewed with patient.  White blood cell 3.5, hemoglobin 8.8, platelets 100 -- Will drop dose of Abraxane down to 100 mg/m and Gemcitabine down to 800 mg/m2 -- Treatment held on 01/04/2023 and again 01/13/2023 due to significant cytopenias. --labs have rebounded robustly. She will proceed with treatment today as scheduled.  --will recheck labs and follow up with Dr. Leonides Schanz as scheduled 02/03/2023.  --CT CAP scheduled on 01/20/2023.    Plan: Labs reviewed  -CBC showing WBC 4.4; Hgb 9.6; Hct 31.0; MCV 94.8; plt 190; Anc 1.9 -CMP - K 3.9; glucose 111; BUN 13; Creatinine 0.62; eGFR > 60; Ca 9.1;  LFTs normal.   -Labs have rebounded well.  Adequate for treatment at previously reduced doses of Abraxane and gemcitabine. Labs/flush, follow-up, and treatment as scheduled.  The patient understands the plans discussed today and is in agreement with them.  She knows to contact our office if she develops concerns prior to her next appointment.  I provided 25 minutes of face-to-face time during this encounter and > 50% was spent counseling as documented under my assessment and plan.    Carlean Jews, NP  [facility] - A DEPT OF [facility] [address] [place] [address] Dept: [phone] Dept Fax: [phone]   No orders of the defined types were placed in this encounter.     CHIEF COMPLAINT:  CC: Metastatic pancreatic adenocarcinoma involving the liver  Current Treatment: Gemcitabine and Abraxane  INTERVAL HISTORY:  Anita is here today for repeat clinical assessment.  Was last seen by Dr. Leonides Schanz on 12/23/2022.  At that point she was given cycle 2 day 8 of gemcitabine at reduced dose to 800 mg/m and Abraxane reduced to 100 mg.  Dose reduction due to cytopenias.  Treatment held on 01/04/2023 and again on 01/13/2023 due to significant cytopenias.  Today, 01/19/2023, blood counts have rebounded adequately.  She is okay for treatment.  She reports 3 individual episodes of a tingling sensation in fingers and toes.  This happened for 3 consecutive nights.  There is no pain associated with this sensation.  Lasted only for short period of time and resolved on its own.  She is scheduled for repeat CT CAP on 01/20/2023.  She states that she is feeling good today.  She denies chest pain, chest pressure, or  shortness of breath. She denies headaches or visual disturbances. she denies abdominal pain, nausea, vomiting, or changes in bowel or bladder habits.   Her appetite is good. Her weight has increased 3 pounds over last  month .  I have reviewed the past medical history, past surgical history, social history and family history with the patient and they are unchanged from previous note.  ALLERGIES:  is allergic to compazine [prochlorperazine edisylate], propofol, oxycodone-acetaminophen, prochlorperazine, and labetalol.  MEDICATIONS:  Current Outpatient Medications  Medication Sig Dispense Refill   Cyanocobalamin (VITAMIN B-12 CR PO) Take by mouth.     diphenoxylate-atropine (LOMOTIL) 2.5-0.025 MG tablet Take 1 tablet by mouth 4 (four) times daily as needed for diarrhea or loose stools. 30 tablet 0   eletriptan (RELPAX) 40 MG tablet Take 1 tablet (40 mg total) by mouth as needed for migraine or headache. May repeat in 2 hours if needed. 15 tablet 6   Folic Acid (FOLATE PO) Place 1 mg under the tongue daily.     lidocaine-prilocaine (EMLA) cream Apply 1 Application topically as needed. 30 g 2   LORazepam (ATIVAN) 0.5 MG tablet Take 0.5 mg by mouth as needed for anxiety.     magic mouthwash (multi-ingredient) oral suspension Swish and swallow 5 mLs by mouth 4 times daily as needed for throat discomfort 400 mL 1   magnesium oxide (MAG-OX) 400 (241.3 MG) MG tablet TAKE 1 TABLET BY MOUTH TWICE A DAY 60 tablet 6   morphine (MS CONTIN) 30 MG 12 hr tablet Take 1 tablet (30 mg total) by mouth every 8 (eight) hours. 90 tablet 0   morphine (MSIR) 15 MG tablet Take 1 tablet (15 mg total) by mouth every 6 (six) hours as needed for severe pain or moderate pain. 45 tablet 0   OLANZapine (ZYPREXA) 10 MG tablet TAKE 1 TABLET BY MOUTH EVERYDAY AT BEDTIME 90 tablet 1   ondansetron (ZOFRAN) 8 MG tablet Take 1 tablet (8 mg total) by mouth every 8 (eight) hours as needed for nausea or vomiting. 90 tablet 0   ondansetron (ZOFRAN-ODT) 8 MG disintegrating tablet Take 1 tablet (8 mg total) by mouth every 8 (eight) hours as needed for nausea or vomiting. 20 tablet 0   promethazine (PHENERGAN) 25 MG tablet Take 1 tablet (25 mg total) by  mouth every 6 (six) hours as needed for nausea or vomiting. 30 tablet 0   scopolamine (TRANSDERM-SCOP) 1 MG/3DAYS Place 1 patch (1.5 mg total) onto the skin every 3 (three) days. 10 patch 12   ELDERBERRY PO Take by mouth.     No current facility-administered medications for this visit.    HISTORY OF PRESENT ILLNESS:   Oncology History  Pancreatic adenocarcinoma (HCC)  03/24/2022 Initial Diagnosis   Pancreatic adenocarcinoma (HCC)   03/30/2022 - 10/24/2022 Chemotherapy   Patient is on Treatment Plan : PANCREAS Modified FOLFIRINOX q14d x 4 cycles      Genetic Testing   [facility] CancerNext-Expanded Panel+RNA was Negative. 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 Report date is 04/07/2022.  The CancerNext-Expanded gene panel offered by [employer] and includes sequencing, rearrangement, and RNA analysis for the following 77 genes: AIP, ALK, APC, ATM, AXIN2, BAP1, BARD1, BLM, BMPR1A, BRCA1, BRCA2, BRIP1, CDC73, CDH1, CDK4, CDKN1B, CDKN2A, CHEK2, CTNNA1, DICER1, FANCC, FH, FLCN, GALNT12, KIF1B, LZTR1, MAX, MEN1, MET, MLH1, MSH2, MSH3, MSH6, MUTYH, NBN, NF1, NF2, NTHL1, PALB2, PHOX2B, PMS2, POT1, PRKAR1A, PTCH1, PTEN, RAD51C, RAD51D, RB1, RECQL, RET, SDHA,  SDHAF2, SDHB, SDHC, SDHD, SMAD4, SMARCA4, SMARCB1, SMARCE1, STK11, SUFU, TMEM127, TP53, TSC1, TSC2, VHL and XRCC2 (sequencing and deletion/duplication); EGFR, EGLN1, HOXB13, KIT, MITF, PDGFRA, POLD1, and POLE (sequencing only); EPCAM and GREM1 (deletion/duplication only).     11/10/2022 -  Chemotherapy   Patient is on Treatment Plan : PANCREATIC Abraxane D1,8,15 + Gemcitabine D1,8,15 q28d         REVIEW OF SYSTEMS:   Constitutional: Denies fevers, chills or abnormal weight loss. Good appetite Eyes: Denies blurriness of vision Ears, nose, mouth, throat, and face: Denies mucositis or sore throat Respiratory: Denies cough, dyspnea or wheezes Cardiovascular: Denies palpitation, chest discomfort or  lower extremity swelling Gastrointestinal:  Denies nausea, heartburn or change in bowel habits Skin: Denies abnormal skin rashes Lymphatics: Denies new lymphadenopathy or easy bruising. Continues to feel lymph nodes in the abdomen and left groin. They are non painful.  Neurological:Denies numbness or new weaknesses. She did have 3 episodes of tingling in her fingers and toes. Occurred over three evenings. Episodes lasted for only a short period and resolved on their own.  Behavioral/Psych: Mood is stable, no new changes  All other systems were reviewed with the patient and are negative.   VITALS:   Today's Vitals   01/19/23 0846 01/19/23 0851  BP: (!) 107/59   Pulse: 82   Resp: 16   Temp: 99.6 F (37.6 C)   TempSrc: Temporal   SpO2: 98%   Weight: 120 lb 6.4 oz (54.6 kg)   PainSc:  0-No pain   Body mass index is 22.02 kg/m.   Wt Readings from Last 3 Encounters:  01/19/23 120 lb 6.4 oz (54.6 kg)  12/23/22 117 lb 11.2 oz (53.4 kg)  12/16/22 113 lb 12.8 oz (51.6 kg)    Body mass index is 22.02 kg/m.  Performance status (ECOG): 1 - Symptomatic but completely ambulatory  PHYSICAL EXAM:   GENERAL:alert, no distress and comfortable SKIN: skin color, texture, turgor are normal, no rashes or significant lesions EYES: normal, Conjunctiva are pink and non-injected, sclera clear OROPHARYNX:no exudate, no erythema and lips, buccal mucosa, and tongue normal  NECK: supple, thyroid normal size, non-tender, without nodularity LYMPH:  no palpable lymphadenopathy in the cervical, axillary or inguinal LUNGS: clear to auscultation and percussion with normal breathing effort HEART: regular rate & rhythm and no murmurs and no lower extremity edema ABDOMEN:abdomen soft, non-tender and normal bowel sounds Musculoskeletal:no cyanosis of digits and no clubbing  NEURO: alert & oriented x 3 with fluent speech, no focal motor/sensory deficits  LABORATORY DATA:  I have reviewed the data as  listed    Component Value Date/Time   NA 139 01/19/2023 0822   NA 140 02/21/2012 1205   K 3.9 01/19/2023 0822   K 3.4 (L) 02/21/2012 1205   CL 105 01/19/2023 0822   CL 111 (H) 02/21/2012 1205   CO2 28 01/19/2023 0822   CO2 20 (L) 02/21/2012 1205   GLUCOSE 111 (H) 01/19/2023 0822   GLUCOSE 70 02/21/2012 1205   BUN 13 01/19/2023 0822   BUN 11.0 02/21/2012 1205   CREATININE 0.62 01/19/2023 0822   CREATININE 0.9 02/21/2012 1205   CALCIUM 9.1 01/19/2023 0822   CALCIUM 9.2 02/21/2012 1205   PROT 7.1 01/19/2023 0822   PROT 7.6 02/21/2012 1205   ALBUMIN 3.8 01/19/2023 0822   ALBUMIN 3.8 02/21/2012 1205   AST 33 01/19/2023 0822   AST 13 02/21/2012 1205   ALT 28 01/19/2023 0822   ALT <6 Repeated and Verified 02/21/2012  1205   ALKPHOS 107 01/19/2023 0822   ALKPHOS 40 02/21/2012 1205   BILITOT 0.3 01/19/2023 0822   BILITOT 0.45 02/21/2012 1205   GFRNONAA >60 01/19/2023 0822    Lab Results  Component Value Date   WBC 4.4 01/19/2023   NEUTROABS 1.9 01/19/2023   HGB 9.6 (L) 01/19/2023   HCT 31.0 (L) 01/19/2023   MCV 94.8 01/19/2023   PLT 190 01/19/2023

## 2023-01-19 ENCOUNTER — Inpatient Hospital Stay: Payer: BC Managed Care – PPO

## 2023-01-19 ENCOUNTER — Encounter: Payer: Self-pay | Admitting: Nurse Practitioner

## 2023-01-19 ENCOUNTER — Inpatient Hospital Stay: Payer: BC Managed Care – PPO | Admitting: Nurse Practitioner

## 2023-01-19 VITALS — BP 107/59 | HR 82 | Temp 99.6°F | Resp 16 | Wt 120.4 lb

## 2023-01-19 VITALS — BP 116/82 | HR 54 | Temp 98.0°F | Resp 16

## 2023-01-19 DIAGNOSIS — C259 Malignant neoplasm of pancreas, unspecified: Secondary | ICD-10-CM

## 2023-01-19 DIAGNOSIS — R7989 Other specified abnormal findings of blood chemistry: Secondary | ICD-10-CM | POA: Diagnosis not present

## 2023-01-19 DIAGNOSIS — C787 Secondary malignant neoplasm of liver and intrahepatic bile duct: Secondary | ICD-10-CM

## 2023-01-19 DIAGNOSIS — Z452 Encounter for adjustment and management of vascular access device: Secondary | ICD-10-CM | POA: Diagnosis not present

## 2023-01-19 DIAGNOSIS — C250 Malignant neoplasm of head of pancreas: Secondary | ICD-10-CM | POA: Diagnosis not present

## 2023-01-19 DIAGNOSIS — Z5111 Encounter for antineoplastic chemotherapy: Secondary | ICD-10-CM | POA: Diagnosis not present

## 2023-01-19 DIAGNOSIS — D61818 Other pancytopenia: Secondary | ICD-10-CM | POA: Diagnosis not present

## 2023-01-19 DIAGNOSIS — D709 Neutropenia, unspecified: Secondary | ICD-10-CM | POA: Diagnosis not present

## 2023-01-19 DIAGNOSIS — Z5189 Encounter for other specified aftercare: Secondary | ICD-10-CM | POA: Diagnosis not present

## 2023-01-19 LAB — CBC WITH DIFFERENTIAL (CANCER CENTER ONLY)
Abs Immature Granulocytes: 0 10*3/uL (ref 0.00–0.07)
Basophils Absolute: 0 10*3/uL (ref 0.0–0.1)
Basophils Relative: 1 %
Eosinophils Absolute: 0.1 10*3/uL (ref 0.0–0.5)
Eosinophils Relative: 2 %
HCT: 31 % — ABNORMAL LOW (ref 36.0–46.0)
Hemoglobin: 9.6 g/dL — ABNORMAL LOW (ref 12.0–15.0)
Immature Granulocytes: 0 %
Lymphocytes Relative: 40 %
Lymphs Abs: 1.8 10*3/uL (ref 0.7–4.0)
MCH: 29.4 pg (ref 26.0–34.0)
MCHC: 31 g/dL (ref 30.0–36.0)
MCV: 94.8 fL (ref 80.0–100.0)
Monocytes Absolute: 0.7 10*3/uL (ref 0.1–1.0)
Monocytes Relative: 15 %
Neutro Abs: 1.9 10*3/uL (ref 1.7–7.7)
Neutrophils Relative %: 42 %
Platelet Count: 190 10*3/uL (ref 150–400)
RBC: 3.27 MIL/uL — ABNORMAL LOW (ref 3.87–5.11)
RDW: 14.8 % (ref 11.5–15.5)
WBC Count: 4.4 10*3/uL (ref 4.0–10.5)
nRBC: 0 % (ref 0.0–0.2)

## 2023-01-19 LAB — CMP (CANCER CENTER ONLY)
ALT: 28 U/L (ref 0–44)
AST: 33 U/L (ref 15–41)
Albumin: 3.8 g/dL (ref 3.5–5.0)
Alkaline Phosphatase: 107 U/L (ref 38–126)
Anion gap: 6 (ref 5–15)
BUN: 13 mg/dL (ref 6–20)
CO2: 28 mmol/L (ref 22–32)
Calcium: 9.1 mg/dL (ref 8.9–10.3)
Chloride: 105 mmol/L (ref 98–111)
Creatinine: 0.62 mg/dL (ref 0.44–1.00)
GFR, Estimated: >60 mL/min (ref >60)
Glucose, Bld: 111 mg/dL — ABNORMAL HIGH (ref 70–99)
Potassium: 3.9 mmol/L (ref 3.5–5.1)
Sodium: 139 mmol/L (ref 135–145)
Total Bilirubin: 0.3 mg/dL (ref <1.2)
Total Protein: 7.1 g/dL (ref 6.5–8.1)

## 2023-01-19 MED ORDER — SODIUM CHLORIDE 0.9% FLUSH
10.0000 mL | INTRAVENOUS | Status: DC | PRN
  Administered 2023-01-19: 10 mL

## 2023-01-19 MED ORDER — HEPARIN SOD (PORK) LOCK FLUSH 100 UNIT/ML IV SOLN
500.0000 [IU] | Freq: Once | INTRAVENOUS | Status: AC | PRN
  Administered 2023-01-19: 500 [IU]

## 2023-01-19 MED ORDER — SODIUM CHLORIDE 0.9 % IV SOLN: Freq: Once | INTRAVENOUS | Status: AC

## 2023-01-19 MED ORDER — PACLITAXEL PROTEIN-BOUND CHEMO INJECTION 100 MG
100.0000 mg/m2 | Freq: Once | INTRAVENOUS | Status: AC
  Administered 2023-01-19: 150 mg via INTRAVENOUS
  Filled 2023-01-19: qty 30

## 2023-01-19 MED ORDER — ONDANSETRON HCL 4 MG/2ML IJ SOLN
8.0000 mg | Freq: Once | INTRAMUSCULAR | Status: AC
  Administered 2023-01-19: 8 mg via INTRAVENOUS
  Filled 2023-01-19: qty 4

## 2023-01-19 MED ORDER — SODIUM CHLORIDE 0.9 % IV SOLN
800.0000 mg/m2 | Freq: Once | INTRAVENOUS | Status: AC
  Administered 2023-01-19: 1216 mg via INTRAVENOUS
  Filled 2023-01-19: qty 31.98

## 2023-01-19 NOTE — Patient Instructions (Signed)
[facility] - A DEPT OF [facility]  Discharge Instructions: Thank you for choosing [facility] to provide your oncology and hematology care.   If you have a lab appointment with the Cancer Center, please go directly to the Cancer Center and check in at the registration area.   Wear comfortable clothing and clothing appropriate for easy access to any Portacath or PICC line.   We strive to give you quality time with your provider. You may need to reschedule your appointment if you arrive late (15 or more minutes).  Arriving late affects you and other patients whose appointments are after yours.  Also, if you miss three or more appointments without notifying the office, you may be dismissed from the clinic at the provider's discretion.      For prescription refill requests, have your pharmacy contact our office and allow 72 hours for refills to be completed.    Today you received the following chemotherapy and/or immunotherapy agents: Paclitaxel protein (Abraxane) and Gemcitabine.      To help prevent nausea and vomiting after your treatment, we encourage you to take your nausea medication as directed.  BELOW ARE SYMPTOMS THAT SHOULD BE REPORTED IMMEDIATELY: *FEVER GREATER THAN 100.4 F (38 C) OR HIGHER *CHILLS OR SWEATING *NAUSEA AND VOMITING THAT IS NOT CONTROLLED WITH YOUR NAUSEA MEDICATION *UNUSUAL SHORTNESS OF BREATH *UNUSUAL BRUISING OR BLEEDING *URINARY PROBLEMS (pain or burning when urinating, or frequent urination) *BOWEL PROBLEMS (unusual diarrhea, constipation, pain near the anus) TENDERNESS IN MOUTH AND THROAT WITH OR WITHOUT PRESENCE OF ULCERS (sore throat, sores in mouth, or a toothache) UNUSUAL RASH, SWELLING OR PAIN  UNUSUAL VAGINAL DISCHARGE OR ITCHING   Items with * indicate a potential emergency and should be followed up as soon as possible or go to the Emergency Department if any problems should occur.  Please show the  CHEMOTHERAPY ALERT CARD or IMMUNOTHERAPY ALERT CARD at check-in to the Emergency Department and triage nurse.  Should you have questions after your visit or need to cancel or reschedule your appointment, please contact [facility] - A DEPT OF [facility]  Dept: [phone]  and follow the prompts.  Office hours are 8:00 a.m. to 4:30 p.m. Monday - Friday. Please note that voicemails left after 4:00 p.m. may not be returned until the following business day.  We are closed weekends and major holidays. You have access to a nurse at all times for urgent questions. Please call the main number to the clinic Dept: [phone] and follow the prompts.   For any non-urgent questions, you may also contact your provider using MyChart. We now offer e-Visits for anyone 62 and older to request care online for non-urgent symptoms. For details visit [email or web address].   Also download the MyChart app! Go to the app store, search "MyChart", open the app, select [place], and log in with your MyChart username and password.

## 2023-01-20 ENCOUNTER — Ambulatory Visit
Admission: RE | Admit: 2023-01-20 | Discharge: 2023-01-20 | Disposition: A | Discharge status: Home / Self Care | Payer: BC Managed Care – PPO | Source: Ambulatory Visit | Attending: Hematology and Oncology | Admitting: Hematology and Oncology

## 2023-01-20 ENCOUNTER — Ambulatory Visit (HOSPITAL_COMMUNITY): Payer: BC Managed Care – PPO

## 2023-01-20 ENCOUNTER — Encounter: Payer: Self-pay | Admitting: Hematology and Oncology

## 2023-01-20 DIAGNOSIS — I700 Atherosclerosis of aorta: Secondary | ICD-10-CM | POA: Diagnosis not present

## 2023-01-20 DIAGNOSIS — R599 Enlarged lymph nodes, unspecified: Secondary | ICD-10-CM | POA: Diagnosis not present

## 2023-01-20 DIAGNOSIS — C259 Malignant neoplasm of pancreas, unspecified: Secondary | ICD-10-CM

## 2023-01-20 DIAGNOSIS — C787 Secondary malignant neoplasm of liver and intrahepatic bile duct: Secondary | ICD-10-CM | POA: Diagnosis not present

## 2023-01-20 MED ORDER — IOPAMIDOL (ISOVUE-300) INJECTION 61%
100.0000 mL | Freq: Once | INTRAVENOUS | Status: AC | PRN
  Administered 2023-01-20: 100 mL via INTRAVENOUS

## 2023-01-24 ENCOUNTER — Other Ambulatory Visit: Payer: Self-pay

## 2023-01-25 ENCOUNTER — Other Ambulatory Visit: Payer: Self-pay | Admitting: Hematology and Oncology

## 2023-01-27 ENCOUNTER — Inpatient Hospital Stay: Payer: BC Managed Care – PPO

## 2023-01-27 ENCOUNTER — Other Ambulatory Visit: Payer: Self-pay | Admitting: Hematology and Oncology

## 2023-01-27 ENCOUNTER — Other Ambulatory Visit: Payer: Self-pay

## 2023-01-27 ENCOUNTER — Encounter: Payer: Self-pay | Admitting: Hematology and Oncology

## 2023-01-27 VITALS — BP 129/80 | HR 66 | Temp 97.8°F | Resp 18 | Wt 123.5 lb

## 2023-01-27 DIAGNOSIS — C259 Malignant neoplasm of pancreas, unspecified: Secondary | ICD-10-CM

## 2023-01-27 DIAGNOSIS — C250 Malignant neoplasm of head of pancreas: Secondary | ICD-10-CM | POA: Diagnosis not present

## 2023-01-27 DIAGNOSIS — C787 Secondary malignant neoplasm of liver and intrahepatic bile duct: Secondary | ICD-10-CM | POA: Diagnosis not present

## 2023-01-27 DIAGNOSIS — Z95828 Presence of other vascular implants and grafts: Secondary | ICD-10-CM

## 2023-01-27 DIAGNOSIS — Z5189 Encounter for other specified aftercare: Secondary | ICD-10-CM | POA: Diagnosis not present

## 2023-01-27 DIAGNOSIS — R7989 Other specified abnormal findings of blood chemistry: Secondary | ICD-10-CM | POA: Diagnosis not present

## 2023-01-27 DIAGNOSIS — D61818 Other pancytopenia: Secondary | ICD-10-CM | POA: Diagnosis not present

## 2023-01-27 DIAGNOSIS — Z5111 Encounter for antineoplastic chemotherapy: Secondary | ICD-10-CM | POA: Diagnosis not present

## 2023-01-27 DIAGNOSIS — Z452 Encounter for adjustment and management of vascular access device: Secondary | ICD-10-CM | POA: Diagnosis not present

## 2023-01-27 DIAGNOSIS — D709 Neutropenia, unspecified: Secondary | ICD-10-CM | POA: Diagnosis not present

## 2023-01-27 LAB — CBC WITH DIFFERENTIAL (CANCER CENTER ONLY)
Abs Immature Granulocytes: 0.01 10*3/uL (ref 0.00–0.07)
Basophils Absolute: 0 10*3/uL (ref 0.0–0.1)
Basophils Relative: 0 %
Eosinophils Absolute: 0 10*3/uL (ref 0.0–0.5)
Eosinophils Relative: 1 %
HCT: 28.6 % — ABNORMAL LOW (ref 36.0–46.0)
Hemoglobin: 9.2 g/dL — ABNORMAL LOW (ref 12.0–15.0)
Immature Granulocytes: 0 %
Lymphocytes Relative: 43 %
Lymphs Abs: 1.4 10*3/uL (ref 0.7–4.0)
MCH: 29.5 pg (ref 26.0–34.0)
MCHC: 32.2 g/dL (ref 30.0–36.0)
MCV: 91.7 fL (ref 80.0–100.0)
Monocytes Absolute: 0.3 10*3/uL (ref 0.1–1.0)
Monocytes Relative: 9 %
Neutro Abs: 1.5 10*3/uL — ABNORMAL LOW (ref 1.7–7.7)
Neutrophils Relative %: 47 %
Platelet Count: 72 10*3/uL — ABNORMAL LOW (ref 150–400)
RBC: 3.12 MIL/uL — ABNORMAL LOW (ref 3.87–5.11)
RDW: 14.2 % (ref 11.5–15.5)
WBC Count: 3.3 10*3/uL — ABNORMAL LOW (ref 4.0–10.5)
nRBC: 0 % (ref 0.0–0.2)

## 2023-01-27 LAB — CMP (CANCER CENTER ONLY)
ALT: 28 U/L (ref 0–44)
AST: 32 U/L (ref 15–41)
Albumin: 3.7 g/dL (ref 3.5–5.0)
Alkaline Phosphatase: 116 U/L (ref 38–126)
Anion gap: 5 (ref 5–15)
BUN: 11 mg/dL (ref 6–20)
CO2: 27 mmol/L (ref 22–32)
Calcium: 9.2 mg/dL (ref 8.9–10.3)
Chloride: 107 mmol/L (ref 98–111)
Creatinine: 0.54 mg/dL (ref 0.44–1.00)
GFR, Estimated: >60 mL/min (ref >60)
Glucose, Bld: 97 mg/dL (ref 70–99)
Potassium: 3.9 mmol/L (ref 3.5–5.1)
Sodium: 139 mmol/L (ref 135–145)
Total Bilirubin: 0.2 mg/dL (ref <1.2)
Total Protein: 6.6 g/dL (ref 6.5–8.1)

## 2023-01-27 MED ORDER — SODIUM CHLORIDE 0.9 % IV SOLN
800.0000 mg/m2 | Freq: Once | INTRAVENOUS | Status: AC
  Administered 2023-01-27: 1216 mg via INTRAVENOUS
  Filled 2023-01-27: qty 31.98

## 2023-01-27 MED ORDER — PACLITAXEL PROTEIN-BOUND CHEMO INJECTION 100 MG
100.0000 mg/m2 | Freq: Once | INTRAVENOUS | Status: AC
  Administered 2023-01-27: 150 mg via INTRAVENOUS
  Filled 2023-01-27: qty 30

## 2023-01-27 MED ORDER — SODIUM CHLORIDE 0.9 % IV SOLN: Freq: Once | INTRAVENOUS | Status: AC

## 2023-01-27 MED ORDER — SODIUM CHLORIDE 0.9% FLUSH
10.0000 mL | INTRAVENOUS | Status: DC | PRN
Start: 2023-01-27 — End: 2023-01-27
  Administered 2023-01-27: 10 mL

## 2023-01-27 MED ORDER — HEPARIN SOD (PORK) LOCK FLUSH 100 UNIT/ML IV SOLN
500.0000 [IU] | Freq: Once | INTRAVENOUS | Status: AC | PRN
Start: 2023-01-27 — End: 2023-01-27
  Administered 2023-01-27: 500 [IU]

## 2023-01-27 MED ORDER — ONDANSETRON HCL 4 MG/2ML IJ SOLN
8.0000 mg | Freq: Once | INTRAMUSCULAR | Status: AC
  Administered 2023-01-27: 8 mg via INTRAVENOUS
  Filled 2023-01-27: qty 4

## 2023-01-27 MED ORDER — SODIUM CHLORIDE 0.9% FLUSH
10.0000 mL | Freq: Once | INTRAVENOUS | Status: AC
  Administered 2023-01-27: 10 mL

## 2023-01-27 NOTE — Progress Notes (Signed)
Clarified with Dr. Leonides Schanz that today will be C3D8 instead of C3D1. Will cancel C3D8 in the tx plan (as C3D1 was released) and will proceed with C3D15 next week.

## 2023-01-27 NOTE — Addendum Note (Signed)
Addended by: Drusilla Kanner on: 01/27/2023 02:18 PM   Modules accepted: Orders

## 2023-02-03 ENCOUNTER — Inpatient Hospital Stay: Payer: BC Managed Care – PPO

## 2023-02-03 ENCOUNTER — Inpatient Hospital Stay (HOSPITAL_BASED_OUTPATIENT_CLINIC_OR_DEPARTMENT_OTHER): Payer: BC Managed Care – PPO | Admitting: Hematology and Oncology

## 2023-02-03 VITALS — BP 113/64 | HR 62 | Temp 98.1°F | Resp 17 | Ht 62.0 in | Wt 121.7 lb

## 2023-02-03 DIAGNOSIS — Z5189 Encounter for other specified aftercare: Secondary | ICD-10-CM | POA: Diagnosis not present

## 2023-02-03 DIAGNOSIS — C259 Malignant neoplasm of pancreas, unspecified: Secondary | ICD-10-CM | POA: Diagnosis not present

## 2023-02-03 DIAGNOSIS — D709 Neutropenia, unspecified: Secondary | ICD-10-CM | POA: Diagnosis not present

## 2023-02-03 DIAGNOSIS — C787 Secondary malignant neoplasm of liver and intrahepatic bile duct: Secondary | ICD-10-CM | POA: Diagnosis not present

## 2023-02-03 DIAGNOSIS — Z5111 Encounter for antineoplastic chemotherapy: Secondary | ICD-10-CM | POA: Diagnosis not present

## 2023-02-03 DIAGNOSIS — C250 Malignant neoplasm of head of pancreas: Secondary | ICD-10-CM | POA: Diagnosis not present

## 2023-02-03 DIAGNOSIS — Z95828 Presence of other vascular implants and grafts: Secondary | ICD-10-CM

## 2023-02-03 DIAGNOSIS — R7989 Other specified abnormal findings of blood chemistry: Secondary | ICD-10-CM | POA: Diagnosis not present

## 2023-02-03 DIAGNOSIS — Z452 Encounter for adjustment and management of vascular access device: Secondary | ICD-10-CM | POA: Diagnosis not present

## 2023-02-03 DIAGNOSIS — D61818 Other pancytopenia: Secondary | ICD-10-CM | POA: Diagnosis not present

## 2023-02-03 LAB — CBC WITH DIFFERENTIAL (CANCER CENTER ONLY)
Abs Immature Granulocytes: 0 10*3/uL (ref 0.00–0.07)
Basophils Absolute: 0 10*3/uL (ref 0.0–0.1)
Basophils Relative: 0 %
Eosinophils Absolute: 0 10*3/uL (ref 0.0–0.5)
Eosinophils Relative: 1 %
HCT: 25.6 % — ABNORMAL LOW (ref 36.0–46.0)
Hemoglobin: 8.2 g/dL — ABNORMAL LOW (ref 12.0–15.0)
Immature Granulocytes: 0 %
Lymphocytes Relative: 69 %
Lymphs Abs: 1.3 10*3/uL (ref 0.7–4.0)
MCH: 29.4 pg (ref 26.0–34.0)
MCHC: 32 g/dL (ref 30.0–36.0)
MCV: 91.8 fL (ref 80.0–100.0)
Monocytes Absolute: 0.2 10*3/uL (ref 0.1–1.0)
Monocytes Relative: 9 %
Neutro Abs: 0.4 10*3/uL — CL (ref 1.7–7.7)
Neutrophils Relative %: 21 %
Platelet Count: 48 10*3/uL — ABNORMAL LOW (ref 150–400)
RBC: 2.79 MIL/uL — ABNORMAL LOW (ref 3.87–5.11)
RDW: 14.4 % (ref 11.5–15.5)
WBC Count: 1.9 10*3/uL — ABNORMAL LOW (ref 4.0–10.5)
nRBC: 0 % (ref 0.0–0.2)

## 2023-02-03 LAB — CMP (CANCER CENTER ONLY)
ALT: 35 U/L (ref 0–44)
AST: 39 U/L (ref 15–41)
Albumin: 3.7 g/dL (ref 3.5–5.0)
Alkaline Phosphatase: 111 U/L (ref 38–126)
Anion gap: 5 (ref 5–15)
BUN: 15 mg/dL (ref 6–20)
CO2: 28 mmol/L (ref 22–32)
Calcium: 9 mg/dL (ref 8.9–10.3)
Chloride: 108 mmol/L (ref 98–111)
Creatinine: 0.55 mg/dL (ref 0.44–1.00)
GFR, Estimated: >60 mL/min (ref >60)
Glucose, Bld: 95 mg/dL (ref 70–99)
Potassium: 4 mmol/L (ref 3.5–5.1)
Sodium: 141 mmol/L (ref 135–145)
Total Bilirubin: 0.2 mg/dL (ref <1.2)
Total Protein: 6.8 g/dL (ref 6.5–8.1)

## 2023-02-03 MED ORDER — SODIUM CHLORIDE 0.9% FLUSH
10.0000 mL | Freq: Once | INTRAVENOUS | Status: AC
  Administered 2023-02-03: 10 mL via INTRAVENOUS

## 2023-02-03 MED ORDER — SODIUM CHLORIDE 0.9% FLUSH
10.0000 mL | Freq: Once | INTRAVENOUS | Status: AC
  Administered 2023-02-03: 10 mL

## 2023-02-03 MED ORDER — PEGFILGRASTIM-CBQV 6 MG/0.6ML ~~LOC~~ SOSY
6.0000 mg | PREFILLED_SYRINGE | Freq: Once | SUBCUTANEOUS | Status: AC
  Administered 2023-02-03: 6 mg via SUBCUTANEOUS
  Filled 2023-02-03: qty 0.6

## 2023-02-03 MED ORDER — HEPARIN SOD (PORK) LOCK FLUSH 100 UNIT/ML IV SOLN
500.0000 [IU] | Freq: Once | INTRAVENOUS | Status: AC
Start: 2023-02-03 — End: 2023-02-03
  Administered 2023-02-03: 500 [IU] via INTRAVENOUS

## 2023-02-03 NOTE — Progress Notes (Signed)
CRITICAL VALUE STICKER  CRITICAL VALUE: ANC 0.4  RECEIVER (on-site recipient of call): Morrie Sheldon   DATE & TIME NOTIFIED:  12/27 @ 0846  MESSENGER (representative from lab): Shanda Bumps   MD NOTIFIED: Dr. Leonides Schanz   TIME OF NOTIFICATION: [phone]  RESPONSE: message sent to Dr. Leonides Schanz

## 2023-02-03 NOTE — Progress Notes (Signed)
[facility] [phone]   [phone]  PROGRESS NOTE:  Patient Care Team: Kaitlin Hoit, MD as PCP - General (Family Medicine) Pickenpack-Cousar, Arty Baumgartner, NP as Nurse Practitioner (Nurse Practitioner) Kaitlin Standard, MD as Consulting Physician (Hematology and Oncology)  CHIEF COMPLAINTS/PURPOSE OF CONSULTATION:  Metastatic pancreatic adenocarcinoma involving the liver  ONCOLOGIC HISTORY: Presented with nausea and right upper quadrant pain 03/08/2022: Abdominal US showed multiple hypoechoic masses within the liver are indeterminate.  03/10/2022: MR abdomen: Hypoenhancing mass compatible with pancreatic head adenocarcinoma with numerous targetoid enhancing masses in all segments of the liver, compatible with metastatic disease. The mass severely effaces and nearly occludes the portal vein and splenic vein, and occludes the superior mesenteric vein with some collaterals noted. The mass substantially abuts the proximal transverse duodenum, although overt duodenal invasion is indeterminate. 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 Prominent stool throughout the colon favors constipation. Fluid-fluid level in the gallbladder likely from sludge. 03/14/2022: Establish care with [facility]On[facility] 03/17/2022: CT chest: no definitive evidence of lung metastases.  03/22/2022: Liver biopsy confirmed metastatic adenocarcinoma, pancreatic primary.  03/30/2022: Cycle 1, Day 1 of mFOLFIRINOX 04/14/2022: Cycle 2, Day 1 of mFOLFIRINOX HELD due to ANC 300.  04/20/2022: Cycle 2, Day 1 of mFOLFIRINOX 05/04/2022: Cycle 3, Day 1 of  mFOLFIRINOX 05/19/2022: Cycle 4, Day 1 of mFOLFIRINOX HELD per patient request.  07/21/2022: Cycle 4, Day 1 of mFOLFIRINOX 08/04/2022: Cycle 5, Day 1 of mFOLFIRINOX HELD due to Plt 81K 08/17/2022: Cycle 5, Day 1 of mFOLFIRINOX 09/01/2022: Cycle 6, Day 1 of mFOLFIRINOX 09/14/2022: Cycle 7, Day 1 of mFOLFIRINOX HELD due to Plt 31K 09/22/2022: Cycle 7, Day 1 of mFOLFIRINOX HELD due to Plt 73K 10/20/2022 CT C/A/P showed mixed picture with progression of disease. FOLFIRINOX d/c.  11/10/2022: Cycle 1 Day 1 of Gemcitabine/Abraxane.  11/24/2022: treatment held due to cytopenias.  12/23/2022: Cycle 2 Day 8 of Gemcitabine (reduced to 800 mg /m2) and Abraxane (reduced to 100 mg)    HISTORY OF PRESENTING ILLNESS:  Kaitlin Ferrell presents today for a follow up. She was last seen on 11/10/2022.  Patient presents today prior to Cycle 3 Day 15 of Gem/Abraxane.   Kaitlin Ferrell reports she has been feeling well overall in the interim since her last visit.  She reports her energy levels are about 8.5 out of 10.  She notes her appetite has been good and she is up 1 pound in the interim since her last visit.  She reports overall she feels quite well and is surprised that her numbers are doing poorly.  She is having no infectious symptoms such as runny nose, sore throat, or cough.  She feels like her appetite has been improving.  She is not having any pain or abdominal discomfort.  She reports that she is not also having any nausea, vomiting, or diarrhea.  She denies fevers, chills, sweats, shortness of breath, chest pain or cough. She has no other complaints. Rest of the 10 point ROS is below.    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   treated with 4 cycles of actinomycin D from 12-02-11 thru 01-13-12   H/O molar  pregnancy, antepartum    Hypertension    Migraine    Miscarriage     SURGICAL HISTORY: Past Surgical History:  Procedure Laterality Date   BREAST BIOPSY  2001   Left   BREAST LUMPECTOMY     DILATION AND CURETTAGE OF UTERUS  1992, 09/2011   DILATION AND EVACUATION  2011   INSERTION OF MESH N/A 09/03/2013   Procedure: INSERTION OF MESH;  Surgeon: Ardeth Sportsman, MD;  Location: [facility];  Service: General;  Laterality: N/A;   IR CHEST FLUORO  04/14/2022   IR IMAGING GUIDED PORT INSERTION  03/22/2022   IR IMAGING GUIDED PORT INSERTION  05/02/2022   IR PORT REPAIR CENTRAL VENOUS ACCESS DEVICE  04/19/2022   IR REMOVAL TUN CV CATH W/O FL  05/02/2022   IR US LIVER BIOPSY  03/22/2022   PORTACATH PLACEMENT  11/2011   PORTACATH PLACEMENT     REMOVAL  MARCH 2014   VENTRAL HERNIA REPAIR N/A 09/03/2013   Procedure: LAPAROSCOPIC VENTRAL WALL HERNIA REPAIR;  Surgeon: Ardeth Sportsman, MD;  Location: [facility];  Service: General;  Laterality: N/A;   WISDOM TOOTH EXTRACTION     in 11th grade    SOCIAL HISTORY: Social History   Socioeconomic History   Marital status: Married    Spouse name: Kaitlin Ferrell   Number of children: 2   Years of education: college   Highest education level: Not on file  Occupational History    Comment: [employer]  Tobacco Use   Smoking status: Never   Smokeless tobacco: Never  Substance and Sexual Activity   Alcohol use: Yes    Alcohol/week: 1.0 Ferrell drink of alcohol    Types: 1 Glasses of wine per week    Comment: occas   Drug use: No   Sexual activity: Not Currently    Birth control/protection: Pill  Other Topics Concern   Not on file  Social History Narrative   Patient is married Kaitlin Ferrell). Patient works part time at [employer].   Right handed.   Patient has her CMA.   Caffeine- None         Social Drivers of Corporate investment banker Strain: Not on file  Food Insecurity: No Food Insecurity (05/10/2022)   Hunger Vital Sign    Worried About Running Out of  Food in the Last Year: Never true    Ran Out of Food in the Last Year: Never true  Transportation Needs: No Transportation Needs (05/10/2022)   PRAPARE - Administrator, Civil Service (Medical): No    Lack of Transportation (Non-Medical): No  Physical Activity: Not on file  Stress: Not on file  Social Connections: Not on file  Intimate Partner Violence: Not At Risk (05/10/2022)   Humiliation, Afraid, Rape, and Kick questionnaire    Fear of Current or Ex-Partner: No    Emotionally Abused: No    Physically Abused: No    Sexually Abused: No    FAMILY HISTORY: Family History  Problem Relation Age of Onset   Breast cancer Mother        dx > 50   Heart attack Mother    Diabetes Father    Breast cancer Maternal Aunt        dx > 50   Breast cancer Maternal Aunt  dx > 50   Breast cancer Maternal Grandmother        ? < 50   Stroke Paternal Grandmother    Alcoholism Other     ALLERGIES:  is allergic to compazine [prochlorperazine edisylate], propofol, oxycodone-acetaminophen, prochlorperazine, and labetalol.  MEDICATIONS:  Current Outpatient Medications  Medication Sig Dispense Refill   Cyanocobalamin (VITAMIN B-12 CR PO) Take by mouth.     diphenoxylate-atropine (LOMOTIL) 2.5-0.025 MG tablet Take 1 tablet by mouth 4 (four) times daily as needed for diarrhea or loose stools. 30 tablet 0   eletriptan (RELPAX) 40 MG tablet Take 1 tablet (40 mg total) by mouth as needed for migraine or headache. May repeat in 2 hours if needed. 15 tablet 6   Folic Acid (FOLATE PO) Place 1 mg under the tongue daily.     lidocaine-prilocaine (EMLA) cream Apply 1 Application topically as needed. 30 g 2   LORazepam (ATIVAN) 0.5 MG tablet Take 0.5 mg by mouth as needed for anxiety.     magic mouthwash (multi-ingredient) oral suspension Swish and swallow 5 mLs by mouth 4 times daily as needed for throat discomfort 400 mL 1   magnesium oxide (MAG-OX) 400 (241.3 MG) MG tablet TAKE 1 TABLET BY  MOUTH TWICE A DAY 60 tablet 6   morphine (MS CONTIN) 30 MG 12 hr tablet Take 1 tablet (30 mg total) by mouth every 8 (eight) hours. 90 tablet 0   morphine (MSIR) 15 MG tablet Take 1 tablet (15 mg total) by mouth every 6 (six) hours as needed for severe pain or moderate pain. 45 tablet 0   OLANZapine (ZYPREXA) 10 MG tablet TAKE 1 TABLET BY MOUTH EVERYDAY AT BEDTIME 90 tablet 1   ondansetron (ZOFRAN) 8 MG tablet Take 1 tablet (8 mg total) by mouth every 8 (eight) hours as needed for nausea or vomiting. 90 tablet 0   ondansetron (ZOFRAN-ODT) 8 MG disintegrating tablet Take 1 tablet (8 mg total) by mouth every 8 (eight) hours as needed for nausea or vomiting. 20 tablet 0   promethazine (PHENERGAN) 25 MG tablet Take 1 tablet (25 mg total) by mouth every 6 (six) hours as needed for nausea or vomiting. 30 tablet 0   scopolamine (TRANSDERM-SCOP) 1 MG/3DAYS Place 1 patch (1.5 mg total) onto the skin every 3 (three) days. 10 patch 12   No current facility-administered medications for this visit.    REVIEW OF SYSTEMS:   Constitutional: ( - ) fevers, ( - )  chills , ( - ) night sweats Eyes: ( - ) blurriness of vision, ( - ) double vision, ( - ) watery eyes Ears, nose, mouth, throat, and face: ( - ) mucositis, ( - ) sore throat Respiratory: ( - ) cough, ( - ) dyspnea, ( - ) wheezes Cardiovascular: ( - ) palpitation, ( - ) chest discomfort, ( - ) lower extremity swelling Gastrointestinal:  (+) nausea, ( - ) heartburn, ( -) change in bowel habits Skin: ( - ) abnormal skin rashes Lymphatics: ( - ) new lymphadenopathy, ( + ) easy bruising Neurological: ( - ) numbness, ( - ) tingling, ( - ) new weaknesses Behavioral/Psych: ( - ) mood change, ( - ) new changes  All other systems were reviewed with the patient and are negative.  PHYSICAL EXAMINATION: ECOG PERFORMANCE STATUS: 1 - Symptomatic but completely ambulatory  Vitals:   02/03/23 0847  BP: 113/64  Pulse: 62  Resp: 17  Temp: 98.1 F (36.7 C)   SpO2: 100%  Filed Weights   02/03/23 0847  Weight: 121 lb 11.2 oz (55.2 kg)    GENERAL: well appearing female in NAD  SKIN: skin color, texture, turgor are normal, no rashes or significant lesions. Mild bruising with nodularity involving bilateral upper and lower extremities.  EYES: conjunctiva are pink and non-injected, sclera clear LUNGS: clear to auscultation and percussion with normal breathing effort HEART: regular rate & rhythm and no murmurs and no lower extremity edema Musculoskeletal: no cyanosis of digits and no clubbing  PSYCH: alert & oriented x 3, fluent speech NEURO: no focal motor/sensory deficits  LABORATORY DATA:  I have reviewed the data as listed    Latest Ref Rng & Units 02/03/2023    8:21 AM 01/27/2023    9:26 AM 01/19/2023    8:22 AM  CBC  WBC 4.0 - 10.5 K/uL 1.9  3.3  4.4   Hemoglobin 12.0 - 15.0 g/dL 8.2  9.2  9.6   Hematocrit 36.0 - 46.0 % 25.6  28.6  31.0   Platelets 150 - 400 K/uL 48  72  190        Latest Ref Rng & Units 02/03/2023    8:21 AM 01/27/2023    9:26 AM 01/19/2023    8:22 AM  CMP  Glucose 70 - 99 mg/dL 95  97  161   BUN 6 - 20 mg/dL 15  11  13    Creatinine 0.44 - 1.00 mg/dL 0.96  0.45  4.09   Sodium 135 - 145 mmol/L 141  139  139   Potassium 3.5 - 5.1 mmol/L 4.0  3.9  3.9   Chloride 98 - 111 mmol/L 108  107  105   CO2 22 - 32 mmol/L 28  27  28    Calcium 8.9 - 10.3 mg/dL 9.0  9.2  9.1   Total Protein 6.5 - 8.1 g/dL 6.8  6.6  7.1   Total Bilirubin <1.2 mg/dL 0.2  0.2  0.3   Alkaline Phos 38 - 126 U/L 111  116  107   AST 15 - 41 U/L 39  32  33   ALT 0 - 44 U/L 35  28  28      RADIOGRAPHIC STUDIES: I have personally reviewed the radiological images as listed and agreed with the findings in the report. CT CHEST ABDOMEN PELVIS W CONTRAST Result Date: 01/20/2023 CLINICAL DATA:  Pancreatic adenocarcinoma restaging * Tracking Code: BO * EXAM: CT CHEST, ABDOMEN, AND PELVIS WITH CONTRAST TECHNIQUE: Multidetector CT imaging of the  chest, abdomen and pelvis was performed following the Ferrell protocol during bolus administration of intravenous contrast. RADIATION DOSE REDUCTION: This exam was performed according to the departmental dose-optimization program which includes automated exposure control, adjustment of the mA and/or kV according to patient size and/or use of iterative reconstruction technique. CONTRAST:  ISOVUE-300 IOPAMIDOL (ISOVUE-300) INJECTION 61% COMPARISON:  Multiple exams, including 10/20/2022 FINDINGS: CT CHEST FINDINGS Cardiovascular: Left Port-A-Cath tip: Right atrium. Mild atheromatous vascular calcification of the aortic arch. Mediastinum/Nodes: Unremarkable Lungs/Pleura: Biapical pleuroparenchymal scarring. Chronic subpleural reticular calcifications peripherally in the right lower lobe for example on image 82 series 4, stable and probably a residuum from prior atypical infectious bronchiolitis. Musculoskeletal: Stable 6 mm sclerotic lesion in the left posterior T9 vertebral body. CT ABDOMEN PELVIS FINDINGS Hepatobiliary: Mixed appearance of hepatic metastatic lesions, some slightly larger and some slightly smaller. 1.5 by 1.5 right hepatic lobe metastatic lesion on image 49 series 2, formerly 1.9 by 1.3 cm. 1.0 by 0.7  cm segment 4 lesion on image 44 series 2, previously 1.1 by 0.8 cm. Bilobed 1.5 by 1.1 cm lesion in the anterior dome of the liver on image 41 series 2, less distinctly visible on the prior exam and measuring about 1.1 by 0.9 cm. Suspected small cyst adjacent to the gallbladder in the right hepatic lobe. A small segment 3 lesion on image 57 series 2 is unchanged. No biliary dilatation.  Gallbladder unremarkable. Pancreas: The ill-defined margins of the pancreatic head mass make it difficult to reliably measure. By my measurement the mass measures 4.1 by 3.0 cm on image 60 of series 2, when I measure the mass on the prior exam at a similar level I arrive at a measurement of 4.2 by 2.9 cm,  essentially stable. The mass continues to extend around the superior mesenteric artery and occlude the SMV, with collateral reconstitution of the SMV. Spleen: Unremarkable Adrenals/Urinary Tract: Unremarkable Stomach/Bowel: Prominent stool throughout the colon favors constipation. Vascular/Lymphatic: Occluded SMV with collateral flow again noted. The tumor surrounds the SMA. Index aortocaval node 0.8 cm in short axis on image 66 series 2, formerly 0.7 cm by my measurements. Indistinctly marginated left common iliac node mildly prominent at 1.1 cm in short axis on image 79 series 2, formerly 1.2 cm. Left external iliac node 1.1 cm in short axis on image 92 series 2, formerly 0.5 cm. Faint stranding around the left common femoral artery and vein, no definite findings of DVT, query mildly impaired lymphatic drainage. Reproductive: Uterus absent.  Adnexa unremarkable. Other: Trace free pelvic fluid. Musculoskeletal: Sclerotic lesion along the left anterior superior endplate of L1 has less central sclerosis than on the 10/20/2022 exam, this currently measures 1.6 by 0.8 cm on image 54 series 2 and previously measured same. The L5 vertebra is sacralized. IMPRESSION: 1. The pancreatic head mass is essentially stable in size. The mass continues to extend around the superior mesenteric artery and occlude the SMV, with collateral reconstitution of the SMV. 2. Mixed appearance of hepatic metastatic lesions, some slightly larger and some slightly smaller. On balance, stable. 3. Mildly prominent retroperitoneal and left iliac lymph nodes, a left external iliac node is mildly increased compared to prior. 4. Sclerotic lesion along the left anterior superior endplate of L1 has less central sclerosis than on the 10/20/2022 exam, this currently measures 1.6 by 0.8 cm and previously measured the same. The small sclerotic lesion in the T9 vertebral body is unchanged. 5. Prominent stool throughout the colon favors constipation. 6. Trace  free pelvic fluid. 7. Faint stranding around the left common femoral artery and vein, no definite findings of DVT, query mildly impaired lymphatic drainage. 8. Aortic arch atherosclerosis. Aortic Atherosclerosis (ICD10-I70.0). Electronically Signed   By: Gaylyn Rong M.D.   On: 01/20/2023 13:49    ASSESSMENT & PLAN Journee Raya is a 57 y.o. female who returns to the clinic for newly diagnosed pancreatic adenocarcinoma.    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 Dr. Leonides Schanz recommends chemotherapy regimen with FOLFIRINOX q 2 weeks.  --Plan to obtain restaging CT scans every 3 months to assess treatment response.  --[facility] Testing: MS-stable, 2 Muts/Mb, ATMH666fs*31, KRASG12L, MDM2 amplification, CDKN2A/B. No targetable mutations.  --Started mFOLFIRINOX on 03/30/2022 --After Cycle 3 on 05/04/2022, patient was on a treatment break due to poor tolerance and  patient's request. --Most recent CT CAP from 06/30/2022 showed treatment response. --Patient resumed FOLFIRINOX without dose modification on 07/21/2022  --progression noted on CT scan from 10/20/2022, d/c FOLFIRINOX.  PLAN: --Due for Cycle 3, Day 15 Gem/Abraxane today. Will HOLD due to pancytopenia with severe neutropenia.  --Labs from today reviewed with patient.  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  --Currently on Eliquis 5 mg BID  #Neutropenia --Neutropenic precautions given to patient including monitoring for fevers.   #Elevated LFTs --Most likely secondary to chemotherapy. LFTs today within normal limits  --Monitor for now  #Nausea/Vomiting: --Secondary to chemotherapy and well controlled.  -- Continue to take scopolamine patch, phenergan 25 mg q 6 hours, zofran 8mg  q8H PRN for nausea, zyprexa nightly PRN for nausea.   #Epigastric/RUQ/back pain: --Likely secondary to underlying malignancy --Tramadol was ineffective.  --Current pain regimen includes MS contin to 30 mg q 8 hours and Norco 10-325 mg.   #Appetite loss/weight loss: --Weight has improved since last week from 116 to 119 lbs today.  --Prescribed Marinol but is not currently taking it. --Encouraged to eat small, frequent meals and supplement with protein shakes --Follows with [facility] nutrition team  #Family history of breast cancer: --Due to strong family history of cancer and has undergone genetic testing in the past.  --Underwent genetic testing on 04/07/2022 that was negative. 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   #Supportive Care -- chemotherapy education to be scheduled  -- port placement complete  -- EMLA cream for port  Orders Placed This Encounter  Procedures   CBC with Differential (Cancer Center Only)    Standing Status:   Future    Expected Date:   03/18/2023    Expiration Date:   03/17/2024   CMP (Cancer Center only)    Standing Status:   Future    Expected Date:   03/18/2023    Expiration Date:   03/17/2024   CBC with Differential (Cancer Center Only)    Standing Status:   Future    Expected Date:   03/25/2023    Expiration Date:   03/24/2024   CMP (Cancer Center only)    Standing Status:   Future    Expected Date:   03/25/2023    Expiration Date:   03/24/2024   CBC with Differential (Cancer Center Only)    Standing  Status:   Future    Expected Date:   04/01/2023    Expiration Date:   03/31/2024   CMP (Cancer Center only)    Standing Status:   Future    Expected Date:   04/01/2023    Expiration Date:   03/31/2024   CA 19.9    Standing Status:   Future    Expected Date:   02/18/2023    Expiration Date:   02/18/2024   All questions were answered. The patient knows to call the clinic with any problems, questions or concerns.  I have spent a total of 30 minutes minutes of face-to-face and non-face-to-face time, preparing to see the patient,  performing a medically appropriate examination, counseling and educating the patient, documenting clinical information in the electronic health record,  and care coordination.   Ulysees Barns, MD Department of Hematology/Oncology [facility] at [facility] ([address]) Phone: [phone] Pager: [phone] Email: [email or web address]

## 2023-02-04 ENCOUNTER — Ambulatory Visit: Payer: BC Managed Care – PPO

## 2023-02-10 ENCOUNTER — Ambulatory Visit: Payer: BC Managed Care – PPO

## 2023-02-10 ENCOUNTER — Other Ambulatory Visit: Payer: BC Managed Care – PPO

## 2023-02-17 ENCOUNTER — Other Ambulatory Visit: Payer: Self-pay | Admitting: *Deleted

## 2023-02-17 ENCOUNTER — Inpatient Hospital Stay: Payer: BC Managed Care – PPO | Admitting: Dietician

## 2023-02-17 ENCOUNTER — Ambulatory Visit (HOSPITAL_BASED_OUTPATIENT_CLINIC_OR_DEPARTMENT_OTHER)
Admission: RE | Admit: 2023-02-17 | Discharge: 2023-02-17 | Disposition: A | Discharge status: Home / Self Care | Payer: BC Managed Care – PPO | Source: Ambulatory Visit | Attending: Hematology and Oncology | Admitting: Hematology and Oncology

## 2023-02-17 ENCOUNTER — Inpatient Hospital Stay (HOSPITAL_BASED_OUTPATIENT_CLINIC_OR_DEPARTMENT_OTHER): Payer: BC Managed Care – PPO | Admitting: Hematology and Oncology

## 2023-02-17 ENCOUNTER — Inpatient Hospital Stay: Payer: BC Managed Care – PPO | Attending: Hematology and Oncology

## 2023-02-17 ENCOUNTER — Inpatient Hospital Stay: Payer: BC Managed Care – PPO

## 2023-02-17 ENCOUNTER — Encounter: Payer: Self-pay | Admitting: Nurse Practitioner

## 2023-02-17 ENCOUNTER — Inpatient Hospital Stay (HOSPITAL_BASED_OUTPATIENT_CLINIC_OR_DEPARTMENT_OTHER): Payer: BC Managed Care – PPO | Admitting: Nurse Practitioner

## 2023-02-17 ENCOUNTER — Telehealth: Payer: Self-pay | Admitting: *Deleted

## 2023-02-17 VITALS — BP 102/63 | HR 73 | Resp 16

## 2023-02-17 VITALS — BP 116/56 | HR 81 | Temp 97.2°F | Resp 16 | Wt 118.7 lb

## 2023-02-17 DIAGNOSIS — R112 Nausea with vomiting, unspecified: Secondary | ICD-10-CM | POA: Insufficient documentation

## 2023-02-17 DIAGNOSIS — Z515 Encounter for palliative care: Secondary | ICD-10-CM

## 2023-02-17 DIAGNOSIS — C259 Malignant neoplasm of pancreas, unspecified: Secondary | ICD-10-CM

## 2023-02-17 DIAGNOSIS — C787 Secondary malignant neoplasm of liver and intrahepatic bile duct: Secondary | ICD-10-CM | POA: Diagnosis not present

## 2023-02-17 DIAGNOSIS — D61810 Antineoplastic chemotherapy induced pancytopenia: Secondary | ICD-10-CM | POA: Insufficient documentation

## 2023-02-17 DIAGNOSIS — R530 Neoplastic (malignant) related fatigue: Secondary | ICD-10-CM

## 2023-02-17 DIAGNOSIS — R630 Anorexia: Secondary | ICD-10-CM | POA: Insufficient documentation

## 2023-02-17 DIAGNOSIS — Z5111 Encounter for antineoplastic chemotherapy: Secondary | ICD-10-CM | POA: Diagnosis not present

## 2023-02-17 DIAGNOSIS — R634 Abnormal weight loss: Secondary | ICD-10-CM | POA: Insufficient documentation

## 2023-02-17 DIAGNOSIS — M25552 Pain in left hip: Secondary | ICD-10-CM | POA: Insufficient documentation

## 2023-02-17 DIAGNOSIS — C250 Malignant neoplasm of head of pancreas: Secondary | ICD-10-CM | POA: Insufficient documentation

## 2023-02-17 DIAGNOSIS — Z86718 Personal history of other venous thrombosis and embolism: Secondary | ICD-10-CM | POA: Diagnosis not present

## 2023-02-17 DIAGNOSIS — Z452 Encounter for adjustment and management of vascular access device: Secondary | ICD-10-CM | POA: Insufficient documentation

## 2023-02-17 DIAGNOSIS — I10 Essential (primary) hypertension: Secondary | ICD-10-CM | POA: Insufficient documentation

## 2023-02-17 DIAGNOSIS — Z7901 Long term (current) use of anticoagulants: Secondary | ICD-10-CM | POA: Diagnosis not present

## 2023-02-17 DIAGNOSIS — D701 Agranulocytosis secondary to cancer chemotherapy: Secondary | ICD-10-CM | POA: Insufficient documentation

## 2023-02-17 DIAGNOSIS — R7989 Other specified abnormal findings of blood chemistry: Secondary | ICD-10-CM | POA: Insufficient documentation

## 2023-02-17 DIAGNOSIS — G893 Neoplasm related pain (acute) (chronic): Secondary | ICD-10-CM | POA: Diagnosis not present

## 2023-02-17 DIAGNOSIS — Z95828 Presence of other vascular implants and grafts: Secondary | ICD-10-CM

## 2023-02-17 LAB — CMP (CANCER CENTER ONLY)
ALT: 11 U/L (ref 0–44)
AST: 16 U/L (ref 15–41)
Albumin: 3.9 g/dL (ref 3.5–5.0)
Alkaline Phosphatase: 121 U/L (ref 38–126)
Anion gap: 5 (ref 5–15)
BUN: 13 mg/dL (ref 6–20)
CO2: 28 mmol/L (ref 22–32)
Calcium: 9.3 mg/dL (ref 8.9–10.3)
Chloride: 105 mmol/L (ref 98–111)
Creatinine: 0.57 mg/dL (ref 0.44–1.00)
GFR, Estimated: >60 mL/min (ref >60)
Glucose, Bld: 108 mg/dL — ABNORMAL HIGH (ref 70–99)
Potassium: 3.6 mmol/L (ref 3.5–5.1)
Sodium: 138 mmol/L (ref 135–145)
Total Bilirubin: 0.4 mg/dL (ref 0.0–1.2)
Total Protein: 7.2 g/dL (ref 6.5–8.1)

## 2023-02-17 LAB — CBC WITH DIFFERENTIAL (CANCER CENTER ONLY)
Abs Immature Granulocytes: 0.02 10*3/uL (ref 0.00–0.07)
Basophils Absolute: 0 10*3/uL (ref 0.0–0.1)
Basophils Relative: 0 %
Eosinophils Absolute: 0.1 10*3/uL (ref 0.0–0.5)
Eosinophils Relative: 1 %
HCT: 28.3 % — ABNORMAL LOW (ref 36.0–46.0)
Hemoglobin: 9 g/dL — ABNORMAL LOW (ref 12.0–15.0)
Immature Granulocytes: 0 %
Lymphocytes Relative: 22 %
Lymphs Abs: 1.5 10*3/uL (ref 0.7–4.0)
MCH: 29.1 pg (ref 26.0–34.0)
MCHC: 31.8 g/dL (ref 30.0–36.0)
MCV: 91.6 fL (ref 80.0–100.0)
Monocytes Absolute: 0.9 10*3/uL (ref 0.1–1.0)
Monocytes Relative: 14 %
Neutro Abs: 4.2 10*3/uL (ref 1.7–7.7)
Neutrophils Relative %: 63 %
Platelet Count: 169 10*3/uL (ref 150–400)
RBC: 3.09 MIL/uL — ABNORMAL LOW (ref 3.87–5.11)
RDW: 16.4 % — ABNORMAL HIGH (ref 11.5–15.5)
WBC Count: 6.8 10*3/uL (ref 4.0–10.5)
nRBC: 0 % (ref 0.0–0.2)

## 2023-02-17 MED ORDER — HEPARIN SOD (PORK) LOCK FLUSH 100 UNIT/ML IV SOLN
500.0000 [IU] | Freq: Once | INTRAVENOUS | Status: AC | PRN
Start: 2023-02-17 — End: 2023-02-17
  Administered 2023-02-17: 500 [IU]

## 2023-02-17 MED ORDER — SODIUM CHLORIDE 0.9% FLUSH
10.0000 mL | Freq: Once | INTRAVENOUS | Status: AC
  Administered 2023-02-17: 10 mL

## 2023-02-17 MED ORDER — MORPHINE SULFATE ER 30 MG PO TBCR: 30.0000 mg | EXTENDED_RELEASE_TABLET | Freq: Three times a day (TID) | ORAL | 0 refills | Status: DC

## 2023-02-17 MED ORDER — SODIUM CHLORIDE 0.9 % IV SOLN: Freq: Once | INTRAVENOUS | Status: AC

## 2023-02-17 MED ORDER — SODIUM CHLORIDE 0.9 % IV SOLN
800.0000 mg/m2 | Freq: Once | INTRAVENOUS | Status: AC
  Administered 2023-02-17: 1216 mg via INTRAVENOUS
  Filled 2023-02-17: qty 31.98

## 2023-02-17 MED ORDER — PACLITAXEL PROTEIN-BOUND CHEMO INJECTION 100 MG
100.0000 mg/m2 | Freq: Once | INTRAVENOUS | Status: AC
Start: 2023-02-17 — End: 2023-02-17
  Administered 2023-02-17: 150 mg via INTRAVENOUS
  Filled 2023-02-17: qty 30

## 2023-02-17 MED ORDER — MORPHINE SULFATE 15 MG PO TABS: 15.0000 mg | ORAL_TABLET | ORAL | 0 refills | Status: DC | PRN

## 2023-02-17 MED ORDER — SODIUM CHLORIDE 0.9% FLUSH
10.0000 mL | INTRAVENOUS | Status: DC | PRN
  Administered 2023-02-17: 10 mL

## 2023-02-17 MED ORDER — ONDANSETRON HCL 4 MG/2ML IJ SOLN
8.0000 mg | Freq: Once | INTRAMUSCULAR | Status: AC
Start: 2023-02-17 — End: 2023-02-17
  Administered 2023-02-17: 8 mg via INTRAVENOUS
  Filled 2023-02-17: qty 4

## 2023-02-17 MED ORDER — MORPHINE SULFATE ER 15 MG PO TBCR: 15.0000 mg | EXTENDED_RELEASE_TABLET | Freq: Three times a day (TID) | ORAL | 0 refills | Status: DC

## 2023-02-17 NOTE — Patient Instructions (Signed)
 [facility] - A DEPT OF [facility]  Discharge Instructions: Thank you for choosing [facility] to provide your oncology and hematology care.   If you have a lab appointment with the Cancer Center, please go directly to the Cancer Center and check in at the registration area.   Wear comfortable clothing and clothing appropriate for easy access to any Portacath or PICC line.   We strive to give you quality time with your provider. You may need to reschedule your appointment if you arrive late (15 or more minutes).  Arriving late affects you and other patients whose appointments are after yours.  Also, if you miss three or more appointments without notifying the office, you may be dismissed from the clinic at the provider's discretion.      For prescription refill requests, have your pharmacy contact our office and allow 72 hours for refills to be completed.    Today you received the following chemotherapy and/or immunotherapy agents: Paclitaxel protein (Abraxane) and Gemcitabine.      To help prevent nausea and vomiting after your treatment, we encourage you to take your nausea medication as directed.  BELOW ARE SYMPTOMS THAT SHOULD BE REPORTED IMMEDIATELY: *FEVER GREATER THAN 100.4 F (38 C) OR HIGHER *CHILLS OR SWEATING *NAUSEA AND VOMITING THAT IS NOT CONTROLLED WITH YOUR NAUSEA MEDICATION *UNUSUAL SHORTNESS OF BREATH *UNUSUAL BRUISING OR BLEEDING *URINARY PROBLEMS (pain or burning when urinating, or frequent urination) *BOWEL PROBLEMS (unusual diarrhea, constipation, pain near the anus) TENDERNESS IN MOUTH AND THROAT WITH OR WITHOUT PRESENCE OF ULCERS (sore throat, sores in mouth, or a toothache) UNUSUAL RASH, SWELLING OR PAIN  UNUSUAL VAGINAL DISCHARGE OR ITCHING   Items with * indicate a potential emergency and should be followed up as soon as possible or go to the Emergency Department if any problems should occur.  Please show the  CHEMOTHERAPY ALERT CARD or IMMUNOTHERAPY ALERT CARD at check-in to the Emergency Department and triage nurse.  Should you have questions after your visit or need to cancel or reschedule your appointment, please contact [facility] - A DEPT OF [facility]  Dept: [phone]  and follow the prompts.  Office hours are 8:00 a.m. to 4:30 p.m. Monday - Friday. Please note that voicemails left after 4:00 p.m. may not be returned until the following business day.  We are closed weekends and major holidays. You have access to a nurse at all times for urgent questions. Please call the main number to the clinic Dept: [phone] and follow the prompts.   For any non-urgent questions, you may also contact your provider using MyChart. We now offer e-Visits for anyone 62 and older to request care online for non-urgent symptoms. For details visit [email or web address].   Also download the MyChart app! Go to the app store, search "MyChart", open the app, select [place], and log in with your MyChart username and password.

## 2023-02-17 NOTE — Progress Notes (Signed)
 Nutrition Follow-up:  Patient with pancreatic carcinoma metastatic to liver. She is currently receiving gemcitabine /abraxane  q28d.   Met with patient in infusion. She is eating Chick Fila at visit. Patient reports good appetite for the most part. She is tolerating small portions several times daily. Patient says she is surprised by wt loss. Appetite is decreased 3-4 days following therapy due to fatigue and feeling blah. She denies nausea, vomiting, diarrhea, constipation.    Medications: reviewed   Labs: reviewed   Anthropometrics: Wt 118 lb 11.2 oz today decreased   12/27 - 121 lb 11.2 oz  11/15 - 117 lb 11.2 oz 11/8 - 113 lb 12.8 oz   NUTRITION DIAGNOSIS: Unintentional wt loss continues     INTERVENTION:  Continue small frequent meals with adequate calories and protein  Encouraged drinking CIB supplements with fairlife milk when appetite is decreased     MONITORING, EVALUATION, GOAL: wt trends, intake   NEXT VISIT: Friday February 7 during infusion

## 2023-02-17 NOTE — Progress Notes (Signed)
 Lower extremity venous duplex completed. Please see CV Procedures for preliminary results.  Shona Simpson, RVT 02/17/23 1:17 PM

## 2023-02-17 NOTE — Telephone Encounter (Signed)
 TCT patient regarding results of [place] of left leg. Spoke with her husband [name]. Advised that the U/S of her left leg do not show any blood clots. [name] voiced understanding and will let [name].

## 2023-02-17 NOTE — Progress Notes (Signed)
 [name] [phone]   [phone]  PROGRESS NOTE:  Patient Care Team: Aletha Bene, MD as PCP - General (Family Medicine) Pickenpack-Cousar, [name], NP as Nurse Practitioner (Nurse Practitioner) [name] ONEIDA [name], MD as Consulting Physician (Hematology and Oncology)  CHIEF COMPLAINTS/PURPOSE OF CONSULTATION:  Metastatic pancreatic adenocarcinoma involving the liver  ONCOLOGIC HISTORY: Presented with nausea and right upper quadrant pain 03/08/2022: Abdominal US  showed multiple hypoechoic masses within the liver are indeterminate.  03/10/2022: MR abdomen: Hypoenhancing mass compatible with pancreatic head adenocarcinoma with numerous targetoid enhancing masses in all segments of the liver, compatible with metastatic disease. The mass severely effaces and nearly occludes the portal vein and splenic vein, and occludes the superior mesenteric vein with some collaterals noted. The mass substantially abuts the proximal transverse duodenum, although overt duodenal invasion is indeterminate. 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 Prominent stool throughout the colon favors constipation. Fluid-fluid level in the gallbladder likely from sludge. 03/14/2022: Establish care with [facility] Hematology/Oncology 03/17/2022: CT chest: no definitive evidence of lung metastases.  03/22/2022: Liver biopsy confirmed metastatic adenocarcinoma, pancreatic primary.  03/30/2022: Cycle 1, Day 1 of mFOLFIRINOX 04/14/2022: Cycle 2, Day 1 of mFOLFIRINOX HELD due to ANC 300.  04/20/2022: Cycle 2, Day 1 of mFOLFIRINOX 05/04/2022: Cycle 3, Day 1 of  mFOLFIRINOX 05/19/2022: Cycle 4, Day 1 of mFOLFIRINOX HELD per patient request.  07/21/2022: Cycle 4, Day 1 of mFOLFIRINOX 08/04/2022: Cycle 5, Day 1 of mFOLFIRINOX HELD due to Plt 81K 08/17/2022: Cycle 5, Day 1 of mFOLFIRINOX 09/01/2022: Cycle 6, Day 1 of mFOLFIRINOX 09/14/2022: Cycle 7, Day 1 of mFOLFIRINOX HELD due to Plt 31K 09/22/2022: Cycle 7, Day 1 of mFOLFIRINOX HELD due to Plt 73K 10/20/2022 CT C/A/P showed mixed picture with progression of disease. FOLFIRINOX d/c.  11/10/2022: Cycle 1 Day 1 of Gemcitabine /Abraxane .  11/24/2022: treatment held due to cytopenias.  12/23/2022: Cycle 2 Day 8 of Gemcitabine  (reduced to 800 mg /m2) and Abraxane  (reduced to 100 mg)    HISTORY OF PRESENTING ILLNESS:  Kaitlin Ferrell presents today for a follow up. She was last seen on 02/03/2023.  Patient presents today prior to Cycle 4 Day 1 of Gem/Abraxane .   Kaitlin Ferrell reports she has lost 3 pounds in the interim since her last visit and her pain levels have been increasing.  She reports that she is willing and able to proceed with treatment today but her primary concern is increased pain, predominantly in her left hip and upper right leg.  She reports that she would like an ultrasound in order to rule out DVT.  She is not having any nausea, vomiting, or diarrhea.  She reports that she feels like she is eating well and is surprised by the weight loss.  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  Overall she is willing and able to proceed with chemotherapy treatment today.  She reports that she is not also having any nausea, vomiting, or diarrhea.  She denies fevers, chills, sweats, shortness of breath, chest pain or cough. She has no other complaints. Rest of the 10 point ROS is below.    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   treated with 4 cycles of actinomycin D from 12-02-11 thru 01-13-12   H/O molar pregnancy, antepartum    Hypertension    Migraine    Miscarriage     SURGICAL HISTORY: Past Surgical History:  Procedure Laterality Date   BREAST BIOPSY  2001   Left   BREAST LUMPECTOMY     DILATION AND CURETTAGE OF UTERUS  1992, 09/2011   DILATION AND EVACUATION  2011   INSERTION OF MESH N/A 09/03/2013   Procedure: INSERTION OF MESH;  Surgeon: Elspeth KYM Schultze, MD;  Location: [facility];  Service: General;  Laterality: N/A;   IR CHEST FLUORO  04/14/2022   IR IMAGING GUIDED PORT INSERTION  03/22/2022   IR IMAGING GUIDED PORT INSERTION  05/02/2022   IR PORT REPAIR CENTRAL VENOUS ACCESS DEVICE  04/19/2022   IR REMOVAL TUN CV CATH W/O FL  05/02/2022   IR US  LIVER BIOPSY  03/22/2022   PORTACATH PLACEMENT  11/2011   PORTACATH PLACEMENT     REMOVAL  MARCH 2014   VENTRAL HERNIA REPAIR N/A 09/03/2013   Procedure: LAPAROSCOPIC VENTRAL WALL HERNIA REPAIR;  Surgeon: Elspeth KYM Schultze, MD;  Location: [facility];  Service: General;  Laterality: N/A;   WISDOM TOOTH EXTRACTION     in 11th grade    SOCIAL HISTORY: Social History   Socioeconomic History   Marital status: Married    Spouse name: Beryl   Number of children: 2   Years of education: college   Highest education level: Not on file  Occupational History    Comment: [employer]  Tobacco Use   Smoking status: Never   Smokeless tobacco: Never  Substance and Sexual Activity   Alcohol  use: Yes    Alcohol /week: 1.0 standard drink of alcohol     Types: 1 Glasses of wine per week    Comment: occas   Drug use: No   Sexual activity: Not Currently    Birth control/protection: Pill  Other Topics Concern   Not on file  Social History Narrative   Patient is married Kaitlin Ferrell). Patient works part time at [employer].   Right handed.   Patient has her CMA.   Caffeine- None         Social Drivers of Manufacturing Engineer Strain: Not on file  Food Insecurity: No Food Insecurity (05/10/2022)   Hunger Vital Sign    Worried About Running Out of Food in the Last Year: Never true    Ran Out of Food in the Last Year: Never true  Transportation Needs: No Transportation Needs (05/10/2022)   PRAPARE - Administrator, Civil Service (Medical): No    Lack of Transportation (Non-Medical): No  Physical Activity: Not on file  Stress: Not on file  Social Connections: Not on file  Intimate Partner Violence: Not At Risk (05/10/2022)   Humiliation, Afraid, Rape, and Kick questionnaire    Fear of Current or Ex-Partner: No    Emotionally Abused: No    Physically Abused: No    Sexually Abused: No    FAMILY HISTORY: Family History  Problem Relation Age of Onset   Breast cancer Mother        dx > 50   Heart attack Mother  Diabetes Father    Breast cancer Maternal Aunt        dx > 50   Breast cancer Maternal Aunt        dx > 50   Breast cancer Maternal Grandmother        ? < 50   Stroke Paternal Grandmother    Alcoholism Other     ALLERGIES:  is allergic to compazine  [prochlorperazine  edisylate], propofol , oxycodone -acetaminophen , prochlorperazine , and labetalol.  MEDICATIONS:  Current Outpatient Medications  Medication Sig Dispense Refill   Cyanocobalamin (VITAMIN B-12 CR PO) Take by mouth.     diphenoxylate -atropine  (LOMOTIL ) 2.5-0.025 MG tablet Take 1 tablet by mouth 4 (four) times daily as needed for diarrhea or loose stools. 30 tablet 0   eletriptan  (RELPAX ) 40 MG tablet Take 1 tablet (40 mg total) by mouth as needed for migraine or headache. May repeat in 2 hours if needed. 15 tablet 6   Folic Acid (FOLATE PO) Place 1 mg under the tongue daily.     lidocaine -prilocaine  (EMLA ) cream Apply 1 Application topically as needed. 30 g 2   LORazepam  (ATIVAN ) 0.5 MG tablet Take 0.5 mg by mouth as needed for anxiety.     magic mouthwash (multi-ingredient) oral suspension Swish and  swallow 5 mLs by mouth 4 times daily as needed for throat discomfort 400 mL 1   magnesium  oxide (MAG-OX) 400 (241.3 MG) MG tablet TAKE 1 TABLET BY MOUTH TWICE A DAY 60 tablet 6   morphine  (MS CONTIN ) 30 MG 12 hr tablet Take 1 tablet (30 mg total) by mouth every 8 (eight) hours. 90 tablet 0   morphine  (MSIR) 15 MG tablet Take 1 tablet (15 mg total) by mouth every 6 (six) hours as needed for severe pain or moderate pain. 45 tablet 0   OLANZapine  (ZYPREXA ) 10 MG tablet TAKE 1 TABLET BY MOUTH EVERYDAY AT BEDTIME 90 tablet 1   ondansetron  (ZOFRAN ) 8 MG tablet Take 1 tablet (8 mg total) by mouth every 8 (eight) hours as needed for nausea or vomiting. 90 tablet 0   ondansetron  (ZOFRAN -ODT) 8 MG disintegrating tablet Take 1 tablet (8 mg total) by mouth every 8 (eight) hours as needed for nausea or vomiting. 20 tablet 0   promethazine  (PHENERGAN ) 25 MG tablet Take 1 tablet (25 mg total) by mouth every 6 (six) hours as needed for nausea or vomiting. 30 tablet 0   scopolamine  (TRANSDERM-SCOP) 1 MG/3DAYS Place 1 patch (1.5 mg total) onto the skin every 3 (three) days. 10 patch 12   No current facility-administered medications for this visit.    REVIEW OF SYSTEMS:   Constitutional: ( - ) fevers, ( - )  chills , ( - ) night sweats Eyes: ( - ) blurriness of vision, ( - ) double vision, ( - ) watery eyes Ears, nose, mouth, throat, and face: ( - ) mucositis, ( - ) sore throat Respiratory: ( - ) cough, ( - ) dyspnea, ( - ) wheezes Cardiovascular: ( - ) palpitation, ( - ) chest discomfort, ( - ) lower extremity swelling Gastrointestinal:  (+) nausea, ( - ) heartburn, ( -) change in bowel habits Skin: ( - ) abnormal skin rashes Lymphatics: ( - ) new lymphadenopathy, ( + ) easy bruising Neurological: ( - ) numbness, ( - ) tingling, ( - ) new weaknesses Behavioral/Psych: ( - ) mood change, ( - ) new changes  All other systems were reviewed with the patient and are negative.  PHYSICAL EXAMINATION: ECOG  PERFORMANCE STATUS: 1 - Symptomatic but completely ambulatory  There were no vitals filed for this visit.  There were no vitals filed for this visit.   GENERAL: well appearing female in NAD  SKIN: skin color, texture, turgor are normal, no rashes or significant lesions. Mild bruising with nodularity involving bilateral upper and lower extremities.  EYES: conjunctiva are pink and non-injected, sclera clear LUNGS: clear to auscultation and percussion with normal breathing effort HEART: regular rate & rhythm and no murmurs and no lower extremity edema Musculoskeletal: no cyanosis of digits and no clubbing  PSYCH: alert & oriented x 3, fluent speech NEURO: no focal motor/sensory deficits  LABORATORY DATA:  I have reviewed the data as listed    Latest Ref Rng & Units 02/03/2023    8:21 AM 01/27/2023    9:26 AM 01/19/2023    8:22 AM  CBC  WBC 4.0 - 10.5 K/uL 1.9  3.3  4.4   Hemoglobin 12.0 - 15.0 g/dL 8.2  9.2  9.6   Hematocrit 36.0 - 46.0 % 25.6  28.6  31.0   Platelets 150 - 400 K/uL 48  72  190        Latest Ref Rng & Units 02/03/2023    8:21 AM 01/27/2023    9:26 AM 01/19/2023    8:22 AM  CMP  Glucose 70 - 99 mg/dL 95  97  888   BUN 6 - 20 mg/dL 15  11  13    Creatinine 0.44 - 1.00 mg/dL 9.44  9.45  9.37   Sodium 135 - 145 mmol/L 141  139  139   Potassium 3.5 - 5.1 mmol/L 4.0  3.9  3.9   Chloride 98 - 111 mmol/L 108  107  105   CO2 22 - 32 mmol/L 28  27  28    Calcium  8.9 - 10.3 mg/dL 9.0  9.2  9.1   Total Protein 6.5 - 8.1 g/dL 6.8  6.6  7.1   Total Bilirubin <1.2 mg/dL 0.2  0.2  0.3   Alkaline Phos 38 - 126 U/L 111  116  107   AST 15 - 41 U/L 39  32  33   ALT 0 - 44 U/L 35  28  28      RADIOGRAPHIC STUDIES: I have personally reviewed the radiological images as listed and agreed with the findings in the report. CT CHEST ABDOMEN PELVIS W CONTRAST Result Date: 01/20/2023 CLINICAL DATA:  Pancreatic adenocarcinoma restaging * Tracking Code: BO * EXAM: CT CHEST,  ABDOMEN, AND PELVIS WITH CONTRAST TECHNIQUE: Multidetector CT imaging of the chest, abdomen and pelvis was performed following the standard protocol during bolus administration of intravenous contrast. RADIATION DOSE REDUCTION: This exam was performed according to the departmental dose-optimization program which includes automated exposure control, adjustment of the mA and/or kV according to patient size and/or use of iterative reconstruction technique. CONTRAST:  100mL ISOVUE -300 IOPAMIDOL  (ISOVUE -300) INJECTION 61% COMPARISON:  Multiple exams, including 10/20/2022 FINDINGS: CT CHEST FINDINGS Cardiovascular: Left Port-A-Cath tip: Right atrium. Mild atheromatous vascular calcification of the aortic arch. Mediastinum/Nodes: Unremarkable Lungs/Pleura: Biapical pleuroparenchymal scarring. Chronic subpleural reticular calcifications peripherally in the right lower lobe for example on image 82 series 4, stable and probably a residuum from prior atypical infectious bronchiolitis. Musculoskeletal: Stable 6 mm sclerotic lesion in the left posterior T9 vertebral body. CT ABDOMEN PELVIS FINDINGS Hepatobiliary: Mixed appearance of hepatic metastatic lesions, some slightly larger and some slightly smaller. 1.5 by 1.5 right hepatic lobe metastatic lesion on image  49 series 2, formerly 1.9 by 1.3 cm. 1.0 by 0.7 cm segment 4 lesion on image 44 series 2, previously 1.1 by 0.8 cm. Bilobed 1.5 by 1.1 cm lesion in the anterior dome of the liver on image 41 series 2, less distinctly visible on the prior exam and measuring about 1.1 by 0.9 cm. Suspected small cyst adjacent to the gallbladder in the right hepatic lobe. A small segment 3 lesion on image 57 series 2 is unchanged. No biliary dilatation.  Gallbladder unremarkable. Pancreas: The ill-defined margins of the pancreatic head mass make it difficult to reliably measure. By my measurement the mass measures 4.1 by 3.0 cm on image 60 of series 2, when I measure the mass on the prior  exam at a similar level I arrive at a measurement of 4.2 by 2.9 cm, essentially stable. The mass continues to extend around the superior mesenteric artery and occlude the SMV, with collateral reconstitution of the SMV. Spleen: Unremarkable Adrenals/Urinary Tract: Unremarkable Stomach/Bowel: Prominent stool throughout the colon favors constipation. Vascular/Lymphatic: Occluded SMV with collateral flow again noted. The tumor surrounds the SMA. Index aortocaval node 0.8 cm in short axis on image 66 series 2, formerly 0.7 cm by my measurements. Indistinctly marginated left common iliac node mildly prominent at 1.1 cm in short axis on image 79 series 2, formerly 1.2 cm. Left external iliac node 1.1 cm in short axis on image 92 series 2, formerly 0.5 cm. Faint stranding around the left common femoral artery and vein, no definite findings of DVT, query mildly impaired lymphatic drainage. Reproductive: Uterus absent.  Adnexa unremarkable. Other: Trace free pelvic fluid. Musculoskeletal: Sclerotic lesion along the left anterior superior endplate of L1 has less central sclerosis than on the 10/20/2022 exam, this currently measures 1.6 by 0.8 cm on image 54 series 2 and previously measured same. The L5 vertebra is sacralized. IMPRESSION: 1. The pancreatic head mass is essentially stable in size. The mass continues to extend around the superior mesenteric artery and occlude the SMV, with collateral reconstitution of the SMV. 2. Mixed appearance of hepatic metastatic lesions, some slightly larger and some slightly smaller. On balance, stable. 3. Mildly prominent retroperitoneal and left iliac lymph nodes, a left external iliac node is mildly increased compared to prior. 4. Sclerotic lesion along the left anterior superior endplate of L1 has less central sclerosis than on the 10/20/2022 exam, this currently measures 1.6 by 0.8 cm and previously measured the same. The small sclerotic lesion in the T9 vertebral body is unchanged.  5. Prominent stool throughout the colon favors constipation. 6. Trace free pelvic fluid. 7. Faint stranding around the left common femoral artery and vein, no definite findings of DVT, query mildly impaired lymphatic drainage. 8. Aortic arch atherosclerosis. Aortic Atherosclerosis (ICD10-I70.0). Electronically Signed   By: Ryan Salvage M.D.   On: 01/20/2023 13:49    ASSESSMENT & PLAN Tabby Beaston is a 58 y.o. female who returns to the clinic for newly diagnosed pancreatic adenocarcinoma.    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 Dr. Federico recommends chemotherapy regimen with FOLFIRINOX q 2 weeks.  --Plan to obtain restaging CT scans every 3 months to assess treatment response.  --[facility] Testing: MS-stable, 2 Muts/Mb, ATMH681fs*31, KRASG12L, MDM2 amplification, CDKN2A/B. No targetable mutations.  --Started mFOLFIRINOX on 03/30/2022 --After Cycle 3 on 05/04/2022,  patient was on a treatment break due to poor tolerance and patient's request. --Most recent CT CAP from 06/30/2022 showed treatment response. --Patient resumed FOLFIRINOX without dose modification on 07/21/2022  --progression noted on CT scan from 10/20/2022, d/c FOLFIRINOX.  PLAN: --Due for Cycle 4, Day 1 Gem/Abraxane  today. Will HOLD due to pancytopenia with severe neutropenia.  --Labs from today reviewed with patient.  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  --Currently on Eliquis  5 mg BID  #Neutropenia --Neutropenic precautions given to patient including monitoring for fevers.   #Elevated LFTs --Most likely secondary to chemotherapy. LFTs today within normal limits  --Monitor for now  #Nausea/Vomiting: --Secondary to chemotherapy and well controlled.  -- Continue to take scopolamine  patch, phenergan  25 mg q 6 hours, zofran  8mg  q8H PRN for nausea, zyprexa  nightly PRN for nausea.   #Epigastric/RUQ/back pain: --Likely secondary to underlying malignancy --Tramadol  was ineffective.  --Current pain regimen includes MS contin  to 30 mg q 8 hours and Norco 10-325 mg.   #Appetite loss/weight loss: --Weight has improved since last week from 116 to 119 lbs today.  --Prescribed Marinol  but is not currently taking it. --Encouraged to eat small, frequent meals and supplement with protein shakes --Follows with [facility] nutrition team  #Family history of breast cancer: --Due to strong family history of cancer and has undergone genetic testing in the past.  --Underwent genetic testing on 04/07/2022 that was negative. 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   #Supportive Care -- chemotherapy education to be scheduled  -- port placement complete  -- EMLA  cream for port  No orders of the defined types were placed in this encounter.  All questions were answered. The patient knows to call the clinic with any problems, questions or concerns.  I have spent a total of 30 minutes minutes of face-to-face and non-face-to-face time, preparing to see the patient,  performing a medically appropriate examination, counseling and educating the patient, documenting clinical information in the electronic health record,  and care coordination.   Norleen IVAR Kidney, MD Department of Hematology/Oncology [facility] Phone: [phone] Pager: [phone] Email: [email or web address]

## 2023-02-19 ENCOUNTER — Encounter: Payer: Self-pay | Admitting: Hematology and Oncology

## 2023-02-19 LAB — CANCER ANTIGEN 19-9: CA 19-9: 1987 U/mL — ABNORMAL HIGH (ref 0–35)

## 2023-02-19 NOTE — Progress Notes (Signed)
 Palliative Medicine [facility]  [phone] [phone]   Name: Kaitlin Ferrell Date: 02/19/2023 MRN: [id number]  DOB: Jan 15, 1966  Patient Care Team: Aletha Bene, MD as PCP - General (Family Medicine) Pickenpack-Cousar, [name], NP as Nurse Practitioner (Nurse Practitioner) [name] ONEIDA [name], MD as Consulting Physician (Hematology and Oncology)    INTERVAL HISTORY: Kaitlin Ferrell is a 58 y.o. 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 Other history includes gestational trophoblastic disease, insomnia, HTN, and migraines. Palliative ask to see for symptom and pain management and goals of care.   SOCIAL HISTORY:     reports that she has never smoked. She has never used smokeless tobacco. She reports current alcohol  use of about 1.0 standard drink of alcohol  per week. She reports that she does not use drugs.  ADVANCE DIRECTIVES:  None on file  CODE STATUS: Full code  PAST MEDICAL HISTORY: 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   treated with 4 cycles of actinomycin D from 12-02-11 thru 01-13-12   H/O molar pregnancy, antepartum    Hypertension    Migraine    Miscarriage     ALLERGIES:  is allergic to compazine  [prochlorperazine  edisylate], propofol , oxycodone -acetaminophen , prochlorperazine , and labetalol.  MEDICATIONS:  Current Outpatient Medications  Medication Sig Dispense Refill   morphine  (MS CONTIN ) 15 MG 12 hr tablet Take 1 tablet (15 mg total) by mouth every 8 (eight) hours. Take in addition to MS Contin  30 mg to total 45mg  every 8 hours. 90 tablet 0   Cyanocobalamin (VITAMIN B-12 CR PO) Take by mouth.     diphenoxylate -atropine  (LOMOTIL ) 2.5-0.025 MG  tablet Take 1 tablet by mouth 4 (four) times daily as needed for diarrhea or loose stools. 30 tablet 0   eletriptan  (RELPAX ) 40 MG tablet Take 1 tablet (40 mg total) by mouth as needed for migraine or headache. May repeat in 2 hours if needed. 15 tablet 6   Folic Acid (FOLATE PO) Place 1 mg under the tongue daily.     lidocaine -prilocaine  (EMLA ) cream Apply 1 Application topically as needed. 30 g 2   LORazepam  (ATIVAN ) 0.5 MG tablet Take 0.5 mg by mouth as needed for anxiety.     magic mouthwash (multi-ingredient) oral suspension Swish and swallow 5 mLs by mouth 4 times daily as needed for throat discomfort 400 mL 1   magnesium  oxide (MAG-OX) 400 (241.3 MG) MG tablet TAKE 1 TABLET BY MOUTH TWICE A DAY 60 tablet 6   morphine  (MS CONTIN ) 30 MG 12 hr tablet Take 1 tablet (30 mg total) by mouth every 8 (eight) hours. 90 tablet 0   morphine  (MSIR) 15 MG tablet Take 1 tablet (15 mg total) by mouth every 4 (four) hours as needed for severe pain (pain score 7-10) or moderate pain (pain score 4-6). 60 tablet 0   OLANZapine  (ZYPREXA ) 10 MG tablet TAKE 1 TABLET BY MOUTH EVERYDAY AT BEDTIME 90 tablet 1   ondansetron  (ZOFRAN ) 8 MG tablet Take 1 tablet (8 mg total) by mouth every 8 (eight) hours as needed for nausea or vomiting. 90 tablet 0   ondansetron  (ZOFRAN -ODT) 8 MG disintegrating tablet Take 1 tablet (8 mg total) by mouth every 8 (eight) hours as needed for nausea or vomiting. 20  tablet 0   promethazine  (PHENERGAN ) 25 MG tablet Take 1 tablet (25 mg total) by mouth every 6 (six) hours as needed for nausea or vomiting. 30 tablet 0   scopolamine  (TRANSDERM-SCOP) 1 MG/3DAYS Place 1 patch (1.5 mg total) onto the skin every 3 (three) days. 10 patch 12   No current facility-administered medications for this visit.    VITAL SIGNS: LMP 06/26/2013 Comment: low dose BCP There were no vitals filed for this visit.  Estimated body mass index is 21.71 kg/m as calculated from the following:   Height as of 02/03/23:  5' 2 (1.575 m).   Weight as of an earlier encounter on 02/17/23: 118 lb 11.2 oz (53.8 kg).   PERFORMANCE STATUS (ECOG) : 1 - Symptomatic but completely ambulatory   Physical Exam General: NAD Cardiovascular: regular rate  Extremities: no edema, no joint deformities Skin: no rashes Neurological: AAO x4  IMPRESSION: I saw [name] during her infusion. Tolerating without difficulty. Continues to take things one day at a time. Denies nausea, vomiting, or diarrhea. Her constipation is managed with stool softeners. Some occasional fatigue. Her appetite is fair. Some days are better than others. Weight is down to 118lbs from 121lbs on 12/27. Will continue to closely monitor.   Neoplasm related pain [name] reports her pain is no longer well controlled on current regimen. She is taking as prescribed however pain has become more frequent and intense. She is finding herself having to take breakthrough medication more often. Current regimen consist of MS Contin  30 mg every 8 hours and MS IR 15mg  as needed. Pain is more constant. Education provided on increase of MS Contin  to 45mg  and MS IR 15mg  every 4 hours as needed for breakthrough. Patient verbalized understanding and appreciation. Prescription to be sent to pharmacy.     We will continue to closely monitor and adjust as needed.     Goals of Care   07/07/22- We discussed her current illness and what it means in the larger context of her on-going co-morbidities. Natural disease trajectory and expectations were discussed.   Patient and husband are realistic in their understanding of her cancer diagnosis and trajectory. She shares they have also discussed at length with her children. Patient states she chose to pursue chemotherapy break due to significance of her symptoms. She was extremely fatigue, weak, could not get out of bed, no appetite, weight loss, nausea, vomiting, and diarrhea. [name] shares she was unable to perform ADLs and  husband was having to bathe her. They were concerned that she was facing end-of-life given how poor her quality of life was. Patient and husband speaks to appreciation of her improvement in quality of life over the past several weeks.    I created space and opportunity for patient and husband to discuss goals and hopes. Patient is trying to focus on healthy food options and be as active as she can. They speak to plans of watchful waiting to see what upcoming CT scan results show in terms of her cancer. She will then consider if she would like to pursue additional treatment options versus not. They are clear in their understanding what pros and cons are of pursing or not to pursue further treatment including end-of-life. [name] speaks to her quality of life is most important over quantity. She would not want to spend last moments in a stupor suffering state. Emotional support provided.   We discussed Her current illness and what it means in the larger context of Her  on-going co-morbidities. Natural disease trajectory and expectations were discussed.  I discussed the importance of continued conversation with family and their medical providers regarding overall plan of care and treatment options, ensuring decisions are within the context of the patients values and GOCs.  PLAN:  MS Contin  45mg  every 8 hours. Prescription sent in to pharmacy for 30mg  and 15mg  tablet to be taken together. MS IR 15 every 4 hours as needed for breakthrough pain  Senna-S for bowel regimen.  Appetite fluctuates. Will continue to closely monitor.  I will plan to see patient back in 2-3 weeks in collaboration to other oncology appointments.   Patient expressed understanding and was in agreement with this plan. She also understands that She can call the clinic at any time with any questions, concerns, or complaints.   Any controlled substances utilized were prescribed in the context of palliative care. PDMP has been reviewed.     Visit consisted of counseling and education dealing with the complex and emotionally intense issues of symptom management and palliative care in the setting of serious and potentially life-threatening illness.  Levon Borer, AGPCNP-BC  Palliative Medicine Team/[facility]

## 2023-02-20 ENCOUNTER — Telehealth: Payer: Self-pay

## 2023-02-20 NOTE — Telephone Encounter (Signed)
 Pt called unable to pick up her MS Contin  15mg  tablets. RN called pharmacy, medication order should be ready for pick up later today. Called pt back to notify of pick up, also reviewed medications and med schedule. All questions answered pt verbalized understanding. No further needs at this time.

## 2023-02-22 NOTE — Progress Notes (Signed)
Palliative Medicine [facility]  Telephone:([phone]) [phone] Fax:([phone]) [phone]   Name: Kaitlin Ferrell Date: 02/22/2023 MRN: [id number]  DOB: January 11, 1966  Patient Care Team: Kaitlin Hoit, MD as PCP - General (Family Medicine) Pickenpack-Cousar, Arty Baumgartner, NP as Nurse Practitioner (Nurse Practitioner) Kaitlin Standard, MD as Consulting Physician (Hematology and Oncology)    INTERVAL HISTORY: Kaitlin Ferrell is a 58 y.o. 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 Other history includes gestational trophoblastic disease, insomnia, HTN, and migraines. Palliative ask to see for symptom and pain management and goals of care.   SOCIAL HISTORY:     reports that she has never smoked. She has never used smokeless tobacco. She reports current alcohol use of about 1.0 Ferrell drink of alcohol per week. She reports that she does not use drugs.  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  MEDICATIONS:  Current Outpatient Medications  Medication Sig Dispense Refill   Cyanocobalamin (VITAMIN B-12 CR PO) Take by mouth.     diphenoxylate-atropine (LOMOTIL) 2.5-0.025 MG tablet Take 1 tablet by mouth 4 (four) times daily as needed for diarrhea or loose stools. 30 tablet 0   eletriptan (RELPAX) 40 MG tablet Take 1 tablet (40 mg total) by mouth as  needed for migraine or headache. May repeat in 2 hours if needed. 15 tablet 6   Folic Acid (FOLATE PO) Place 1 mg under the tongue daily.     lidocaine-prilocaine (EMLA) cream Apply 1 Application topically as needed. 30 g 2   LORazepam (ATIVAN) 0.5 MG tablet Take 0.5 mg by mouth as needed for anxiety.     magic mouthwash (multi-ingredient) oral suspension Swish and swallow 5 mLs by mouth 4 times daily as needed for throat discomfort 400 mL 1   magnesium oxide (MAG-OX) 400 (241.3 MG) MG tablet TAKE 1 TABLET BY MOUTH TWICE A DAY 60 tablet 6   morphine (MS CONTIN) 15 MG 12 hr tablet Take 1 tablet (15 mg total) by mouth every 8 (eight) hours. Take in addition to MS Contin 30 mg to total 45mg  every 8 hours. 90 tablet 0   morphine (MS CONTIN) 30 MG 12 hr tablet Take 1 tablet (30 mg total) by mouth every 8 (eight) hours. 90 tablet 0   morphine (MSIR) 15 MG tablet Take 1 tablet (15 mg total) by mouth every 4 (four) hours as needed for severe pain (pain score 7-10) or moderate pain (pain score 4-6). 60 tablet 0   OLANZapine (ZYPREXA) 10 MG tablet TAKE 1 TABLET BY MOUTH EVERYDAY AT BEDTIME 90 tablet 1   ondansetron (ZOFRAN) 8 MG tablet Take 1 tablet (8 mg total) by mouth every 8 (eight) hours as needed for nausea or vomiting. 90 tablet 0   ondansetron (ZOFRAN-ODT) 8 MG disintegrating tablet Take 1 tablet (8 mg total) by mouth every 8 (eight) hours as needed for nausea or vomiting. 20  tablet 0   promethazine (PHENERGAN) 25 MG tablet Take 1 tablet (25 mg total) by mouth every 6 (six) hours as needed for nausea or vomiting. 30 tablet 0   scopolamine (TRANSDERM-SCOP) 1 MG/3DAYS Place 1 patch (1.5 mg total) onto the skin every 3 (three) days. 10 patch 12   No current facility-administered medications for this visit.    VITAL SIGNS: LMP 06/26/2013 Comment: low dose BCP There were no vitals filed for this visit.  Estimated body mass index is 21.71 kg/m as calculated from the following:   Height as of 02/03/23:  5\' 2"  (1.575 m).   Weight as of 02/17/23: 118 lb 11.2 oz (53.8 kg).   PERFORMANCE STATUS (ECOG) : 1 - Symptomatic but completely ambulatory   Physical Exam General: NAD Cardiovascular: regular rate  Extremities: no edema, no joint deformities Skin: no rashes Neurological: AAO x4  IMPRESSION: I saw Kaitlin Ferrell during her infusion. Her treatment is being delayed due to thrombocytopenia. Patient shares frustration as this tends to occur every other treatment or so. She is doing much better overall. Appetite is improving.   The patient reports her pain is much better controlled. She is tolerating the increase of MS Contin to 45mg  with breakthrough medication as needed. Not requiring breakthrough as frequently. She is much appreciative of this.   Will continue to closely monitor and support.   Neoplasm related pain Kaitlin Ferrell reports her pain is no longer well controlled on current regimen. She is taking as prescribed however pain has become more frequent and intense. She is finding herself having to take breakthrough medication more often. Current regimen consist of MS Contin 30 mg every 8 hours and MS IR 15mg  as needed. Pain is more constant. Education provided on increase of MS Contin to 45mg  and MS IR 15mg  every 4 hours as needed for breakthrough. Patient verbalized understanding and appreciation. Prescription to be sent to pharmacy.     We will continue to closely monitor and adjust as needed.    Goals of Care   07/07/22- We discussed her current illness and what it means in the larger context of her on-going co-morbidities. Natural disease trajectory and expectations were discussed.   Patient and husband are realistic in their understanding of her cancer diagnosis and trajectory. She shares they have also discussed at length with her children. Patient states she chose to pursue chemotherapy break due to significance of her symptoms. She was extremely fatigue, weak, could not get out of  bed, no appetite, weight loss, nausea, vomiting, and diarrhea. Kaitlin Ferrell shares she was unable to perform ADLs and husband was having to bathe her. They were concerned that she was facing end-of-life given how poor her quality of life was. Patient and husband speaks to appreciation of her improvement in quality of life over the past several weeks.    I created space and opportunity for patient and husband to discuss goals and hopes. Patient is trying to focus on healthy food options and be as active as she can. They speak to plans of watchful waiting to see what upcoming CT scan results show in terms of her cancer. She will then consider if she would like to pursue additional treatment options versus not. They are clear in their understanding what pros and cons are of pursing or not to pursue further treatment including end-of-life. [name] speaks to her quality of life is most important over quantity. She would not want to spend last moments in a stupor suffering state.  Emotional support provided.   We discussed Her current illness and what it means in the larger context of Her on-going co-morbidities. Natural disease trajectory and expectations were discussed.  I discussed the importance of continued conversation with family and their medical providers regarding overall plan of care and treatment options, ensuring decisions are within the context of the patients values and GOCs.  PLAN:  MS Contin 45mg  every 8 hours. Tolerating without difficulty. Pain much more controlled.  MS IR 15 every 4 hours as needed for breakthrough pain  Senna-S for bowel regimen.  Appetite fluctuates. Will continue to closely monitor.  I will plan to see patient back in 2-3 weeks in collaboration to other oncology appointments.   Patient expressed understanding and was in agreement with this plan. She also understands that She can call the clinic at any time with any questions, concerns, or complaints.   Any controlled  substances utilized were prescribed in the context of palliative care. PDMP has been reviewed.    Visit consisted of counseling and education dealing with the complex and emotionally intense issues of symptom management and palliative care in the setting of serious and potentially life-threatening illness.  Willette Alma, AGPCNP-BC  Palliative Medicine Team/[facility]

## 2023-02-24 ENCOUNTER — Inpatient Hospital Stay (HOSPITAL_BASED_OUTPATIENT_CLINIC_OR_DEPARTMENT_OTHER): Payer: BC Managed Care – PPO | Admitting: Nurse Practitioner

## 2023-02-24 ENCOUNTER — Inpatient Hospital Stay: Payer: BC Managed Care – PPO

## 2023-02-24 ENCOUNTER — Encounter: Payer: Self-pay | Admitting: Nurse Practitioner

## 2023-02-24 VITALS — BP 107/69 | HR 60 | Temp 98.6°F | Resp 18 | Ht 62.0 in | Wt 119.8 lb

## 2023-02-24 DIAGNOSIS — Z515 Encounter for palliative care: Secondary | ICD-10-CM | POA: Diagnosis not present

## 2023-02-24 DIAGNOSIS — Z95828 Presence of other vascular implants and grafts: Secondary | ICD-10-CM

## 2023-02-24 DIAGNOSIS — G893 Neoplasm related pain (acute) (chronic): Secondary | ICD-10-CM | POA: Diagnosis not present

## 2023-02-24 DIAGNOSIS — C259 Malignant neoplasm of pancreas, unspecified: Secondary | ICD-10-CM | POA: Diagnosis not present

## 2023-02-24 DIAGNOSIS — C787 Secondary malignant neoplasm of liver and intrahepatic bile duct: Secondary | ICD-10-CM | POA: Diagnosis not present

## 2023-02-24 DIAGNOSIS — Z5111 Encounter for antineoplastic chemotherapy: Secondary | ICD-10-CM | POA: Diagnosis not present

## 2023-02-24 LAB — CMP (CANCER CENTER ONLY)
ALT: 23 U/L (ref 0–44)
AST: 29 U/L (ref 15–41)
Albumin: 3.8 g/dL (ref 3.5–5.0)
Alkaline Phosphatase: 111 U/L (ref 38–126)
Anion gap: 6 (ref 5–15)
BUN: 19 mg/dL (ref 6–20)
CO2: 29 mmol/L (ref 22–32)
Calcium: 9.1 mg/dL (ref 8.9–10.3)
Chloride: 105 mmol/L (ref 98–111)
Creatinine: 0.53 mg/dL (ref 0.44–1.00)
GFR, Estimated: >60 mL/min (ref >60)
Glucose, Bld: 104 mg/dL — ABNORMAL HIGH (ref 70–99)
Potassium: 3.6 mmol/L (ref 3.5–5.1)
Sodium: 140 mmol/L (ref 135–145)
Total Bilirubin: 0.3 mg/dL (ref 0.0–1.2)
Total Protein: 7 g/dL (ref 6.5–8.1)

## 2023-02-24 LAB — CBC WITH DIFFERENTIAL (CANCER CENTER ONLY)
Abs Immature Granulocytes: 0.01 10*3/uL (ref 0.00–0.07)
Basophils Absolute: 0 10*3/uL (ref 0.0–0.1)
Basophils Relative: 1 %
Eosinophils Absolute: 0.1 10*3/uL (ref 0.0–0.5)
Eosinophils Relative: 2 %
HCT: 26.6 % — ABNORMAL LOW (ref 36.0–46.0)
Hemoglobin: 8.4 g/dL — ABNORMAL LOW (ref 12.0–15.0)
Immature Granulocytes: 0 %
Lymphocytes Relative: 42 %
Lymphs Abs: 1.7 10*3/uL (ref 0.7–4.0)
MCH: 29.1 pg (ref 26.0–34.0)
MCHC: 31.6 g/dL (ref 30.0–36.0)
MCV: 92 fL (ref 80.0–100.0)
Monocytes Absolute: 0.2 10*3/uL (ref 0.1–1.0)
Monocytes Relative: 6 %
Neutro Abs: 1.9 10*3/uL (ref 1.7–7.7)
Neutrophils Relative %: 49 %
Platelet Count: 71 10*3/uL — ABNORMAL LOW (ref 150–400)
RBC: 2.89 MIL/uL — ABNORMAL LOW (ref 3.87–5.11)
RDW: 15.4 % (ref 11.5–15.5)
WBC Count: 3.9 10*3/uL — ABNORMAL LOW (ref 4.0–10.5)
nRBC: 0 % (ref 0.0–0.2)

## 2023-02-24 MED ORDER — HEPARIN SOD (PORK) LOCK FLUSH 100 UNIT/ML IV SOLN
500.0000 [IU] | Freq: Once | INTRAVENOUS | Status: AC
  Administered 2023-02-24: 500 [IU]

## 2023-02-24 MED ORDER — SODIUM CHLORIDE 0.9% FLUSH
10.0000 mL | Freq: Once | INTRAVENOUS | Status: AC
  Administered 2023-02-24: 10 mL

## 2023-02-24 MED ORDER — SODIUM CHLORIDE 0.9% FLUSH
10.0000 mL | Freq: Once | INTRAVENOUS | Status: AC
Start: 2023-02-24 — End: 2023-02-24
  Administered 2023-02-24: 10 mL

## 2023-02-24 NOTE — Progress Notes (Signed)
Plt 71. Dr Leonides Schanz notified. Per Dr. Leonides Schanz, hold treatment today.

## 2023-02-24 NOTE — Patient Instructions (Signed)
Thrombocytopenia Thrombocytopenia means that you have a low number of platelets in your blood. Platelets are tiny cells in the blood. When you bleed, they clump together at the cut or injury to stop the bleeding. This is called blood clotting. If you do not have enough platelets, your blood may have trouble clotting. This may cause you to bleed and bruise very easily. What are the causes? This condition is caused by a low number of platelets in your blood. There are three main reasons for this: Your body not making enough platelets. This may be caused by: Bone marrow diseases. Disorders that are passed from parent to child (inherited). Certain cancer medicines or treatments. Infection from germs (bacteria or viruses). Alcoholism. Platelets not being released in the blood. This can be caused by: Having a spleen that is larger than normal. A condition called Gaucher disease. Your body destroying platelets too quickly. This may be caused by: Certain autoimmune diseases. Some medicines that thin your blood. Certain blood clotting disorders. Certain bleeding disorders. Exposure to harmful (toxic) chemicals. Pregnancy. What are the signs or symptoms? Bruising easily. Bleeding from the nose or mouth. Heavy menstrual periods. Blood in the pee (urine), poop (stool), or vomit. A purple-red color to the skin (purpura). A rash that looks like pinpoint, purple-red spots (petechiae) on the lower legs. How is this treated? Treatment depends on the cause. Treatment may include: Treatment of another condition that is causing the low platelet count. Medicines to help protect your platelets from being destroyed. A replacement (transfusion) of platelets to stop or prevent bleeding. Surgery to take out the spleen. Follow these instructions at home: Medicines Take over-the-counter and prescription medicines only as told by your doctor. Do not take any medicines that have aspirin or NSAIDs, such as  ibuprofen. Activity Avoid doing things that could hurt or bruise you. Take action to prevent falls. Do not play contact sports. Ask your doctor what activities are safe for you. Take care not to burn yourself: When you use an iron. When you cook. Take care not to cut yourself: When you shave. When you use scissors, needles, knives, or other tools. General instructions  Check your skin and the inside of your mouth for bruises or blood as told by your doctor. Wear a medical alert bracelet that says that you have a bleeding disorder. Check to see if there is blood in your pee and poop. Do this as told by your doctor. Do not drink alcohol. If you do drink, limit the amount that you drink. Stay away from harmful (toxic) chemicals. Tell all of your doctors that you have this condition. Be sure to tell your dentist and eye doctor. Tell your dentist about your condition before you have your teeth cleaned. Keep all follow-up visits. Contact a doctor if: You have bruises and you do not know why. You have new symptoms. You have symptoms that get worse. You have a fever. Get help right away if: You have very bad bleeding anywhere on your body. You have blood in your vomit, pee, or poop. You have an injury to your head. You have a sudden, very bad headache. Summary Thrombocytopenia means that you have a low number of platelets in your blood. Platelets stick together to form a clot. Symptoms of this condition include getting bruises easily, bleeding from the mouth and nose, a purple-red color to the skin, and a rash. Take care not to cut or burn yourself. This information is not intended to replace advice given  to you by your health care provider. Make sure you discuss any questions you have with your health care provider. Document Revised: 07/09/2020 Document Reviewed: 07/09/2020 Elsevier Patient Education  2024 [employer].

## 2023-02-28 ENCOUNTER — Encounter: Payer: Self-pay | Admitting: Hematology and Oncology

## 2023-03-02 NOTE — Progress Notes (Signed)
[facility] [phone]   [phone]  PROGRESS NOTE:  Patient Care Team: Bernadette Hoit, MD as PCP - General (Family Medicine) Pickenpack-Cousar, Arty Baumgartner, NP as Nurse Practitioner (Nurse Practitioner) Jaci Standard, MD as Consulting Physician (Hematology and Oncology)  CHIEF COMPLAINTS/PURPOSE OF CONSULTATION:  Metastatic pancreatic adenocarcinoma involving the liver  ONCOLOGIC HISTORY: Presented with nausea and right upper quadrant pain 03/08/2022: Abdominal US showed multiple hypoechoic masses within the liver are indeterminate.  03/10/2022: MR abdomen: Hypoenhancing mass compatible with pancreatic head adenocarcinoma with numerous targetoid enhancing masses in all segments of the liver, compatible with metastatic disease. The mass severely effaces and nearly occludes the portal vein and splenic vein, and occludes the superior mesenteric vein with some collaterals noted. The mass substantially abuts the proximal transverse duodenum, although overt duodenal invasion is indeterminate. 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 Prominent stool throughout the colon favors constipation. Fluid-fluid level in the gallbladder likely from sludge. 03/14/2022: Establish care with [facility]emat[facility]/Oncology 03/17/2022: CT chest: no definitive evidence of lung metastases.  03/22/2022: Liver biopsy confirmed metastatic adenocarcinoma, pancreatic primary.  03/30/2022: Cycle 1, Day 1 of mFOLFIRINOX 04/14/2022: Cycle 2, Day 1 of mFOLFIRINOX HELD due to ANC 300.  04/20/2022: Cycle 2, Day 1 of mFOLFIRINOX 05/04/2022: Cycle 3, Day 1 of  mFOLFIRINOX 05/19/2022: Cycle 4, Day 1 of mFOLFIRINOX HELD per patient request.  07/21/2022: Cycle 4, Day 1 of mFOLFIRINOX 08/04/2022: Cycle 5, Day 1 of mFOLFIRINOX HELD due to Plt 81K 08/17/2022: Cycle 5, Day 1 of mFOLFIRINOX 09/01/2022: Cycle 6, Day 1 of mFOLFIRINOX 09/14/2022: Cycle 7, Day 1 of mFOLFIRINOX HELD due to Plt 31K 09/22/2022: Cycle 7, Day 1 of mFOLFIRINOX HELD due to Plt 73K 10/20/2022 CT C/A/P showed mixed picture with progression of disease. FOLFIRINOX d/c.  11/10/2022: Cycle 1 Day 1 of Gemcitabine/Abraxane.  11/24/2022: treatment held due to cytopenias.  12/23/2022: Cycle 2 Day 8 of Gemcitabine (reduced to 800 mg /m2) and Abraxane (reduced to 100 mg)    HISTORY OF PRESENTING ILLNESS:  Kaitlin Ferrell presents today for a follow up. She was last seen on 02/17/2023.  Patient presents today prior to Cycle 4 Day 8 of Gem/Abraxane.   Ms. Pickron reports no significant changes to her health since the last visit. She adds that her mother recently passed away which has caused her added stress. She reports her appetite and energy levels are fairly stable. She denies nausea, vomiting or bowel habit changes.  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  Overall she is willing and able to proceed with chemotherapy treatment today.  She denies fevers, chills, sweats, shortness of breath, chest pain or cough. She has no other complaints. Rest of the 10 point ROS is below.    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   treated with 4 cycles of actinomycin D from 12-02-11 thru 01-13-12   H/O molar pregnancy, antepartum    Hypertension  Migraine    Miscarriage     SURGICAL HISTORY: Past Surgical History:  Procedure Laterality Date   BREAST BIOPSY   2001   Left   BREAST LUMPECTOMY     DILATION AND CURETTAGE OF UTERUS  1992, 09/2011   DILATION AND EVACUATION  2011   INSERTION OF MESH N/A 09/03/2013   Procedure: INSERTION OF MESH;  Surgeon: Ardeth Sportsman, MD;  Location: [facility];  Service: General;  Laterality: N/A;   IR CHEST FLUORO  04/14/2022   IR IMAGING GUIDED PORT INSERTION  03/22/2022   IR IMAGING GUIDED PORT INSERTION  05/02/2022   IR PORT REPAIR CENTRAL VENOUS ACCESS DEVICE  04/19/2022   IR REMOVAL TUN CV CATH W/O FL  05/02/2022   IR US LIVER BIOPSY  03/22/2022   PORTACATH PLACEMENT  11/2011   PORTACATH PLACEMENT     REMOVAL  MARCH 2014   VENTRAL HERNIA REPAIR N/A 09/03/2013   Procedure: LAPAROSCOPIC VENTRAL WALL HERNIA REPAIR;  Surgeon: Ardeth Sportsman, MD;  Location: [facility];  Service: General;  Laterality: N/A;   WISDOM TOOTH EXTRACTION     in 11th grade    SOCIAL HISTORY: Social History   Socioeconomic History   Marital status: Married    Spouse name: [name]   Number of children: 2   Years of education: college   Highest education level: Not on file  Occupational History    Comment: C[employer]  Tobacco Use   Smoking status: Never   Smokeless tobacco: Never  Substance and Sexual Activity   Alcohol use: Yes    Alcohol/week: 1.0 standard drink of alcohol    Types: 1 Glasses of wine per week    Comment: occas   Drug use: No   Sexual activity: Not Currently    Birth control/protection: Pill  Other Topics Concern   Not on file  Social History Narrative   Patient is married [name]). Patient works part time at [employer].   Right handed.   Patient has her CMA.   Caffeine- None         Social Drivers of Corporate investment banker Strain: Not on file  Food Insecurity: No Food Insecurity (05/10/2022)   Hunger Vital Sign    Worried About Running Out of Food in the Last Year: Never true    Ran Out of Food in the Last Year: Never true  Transportation Needs: No Transportation Needs (05/10/2022)   PRAPARE -  Administrator, Civil Service (Medical): No    Lack of Transportation (Non-Medical): No  Physical Activity: Not on file  Stress: Not on file  Social Connections: Not on file  Intimate Partner Violence: Not At Risk (05/10/2022)   Humiliation, Afraid, Rape, and Kick questionnaire    Fear of Current or Ex-Partner: No    Emotionally Abused: No    Physically Abused: No    Sexually Abused: No    FAMILY HISTORY: Family History  Problem Relation Age of Onset   Breast cancer Mother        dx > 50   Heart attack Mother    Diabetes Father    Breast cancer Maternal Aunt        dx > 50   Breast cancer Maternal Aunt        dx > 50   Breast cancer Maternal Grandmother        ? < 50   Stroke Paternal Grandmother    Alcoholism Other  ALLERGIES:  is allergic to compazine [prochlorperazine edisylate], propofol, oxycodone-acetaminophen, prochlorperazine, and labetalol.  MEDICATIONS:  Current Outpatient Medications  Medication Sig Dispense Refill   Cyanocobalamin (VITAMIN B-12 CR PO) Take by mouth.     diphenoxylate-atropine (LOMOTIL) 2.5-0.025 MG tablet Take 1 tablet by mouth 4 (four) times daily as needed for diarrhea or loose stools. 30 tablet 0   eletriptan (RELPAX) 40 MG tablet Take 1 tablet (40 mg total) by mouth as needed for migraine or headache. May repeat in 2 hours if needed. 15 tablet 6   Folic Acid (FOLATE PO) Place 1 mg under the tongue daily.     lidocaine-prilocaine (EMLA) cream Apply 1 Application topically as needed. 30 g 2   LORazepam (ATIVAN) 0.5 MG tablet Take 0.5 mg by mouth as needed for anxiety.     magic mouthwash (multi-ingredient) oral suspension Swish and swallow 5 mLs by mouth 4 times daily as needed for throat discomfort 400 mL 1   magnesium oxide (MAG-OX) 400 (241.3 MG) MG tablet TAKE 1 TABLET BY MOUTH TWICE A DAY 60 tablet 6   morphine (MS CONTIN) 15 MG 12 hr tablet Take 1 tablet (15 mg total) by mouth every 8 (eight) hours. Take in addition to MS  Contin 30 mg to total 45mg  every 8 hours. 90 tablet 0   morphine (MS CONTIN) 30 MG 12 hr tablet Take 1 tablet (30 mg total) by mouth every 8 (eight) hours. 90 tablet 0   morphine (MSIR) 15 MG tablet Take 1 tablet (15 mg total) by mouth every 4 (four) hours as needed for severe pain (pain score 7-10) or moderate pain (pain score 4-6). 60 tablet 0   OLANZapine (ZYPREXA) 10 MG tablet TAKE 1 TABLET BY MOUTH EVERYDAY AT BEDTIME 90 tablet 1   ondansetron (ZOFRAN) 8 MG tablet Take 1 tablet (8 mg total) by mouth every 8 (eight) hours as needed for nausea or vomiting. 90 tablet 0   ondansetron (ZOFRAN-ODT) 8 MG disintegrating tablet Take 1 tablet (8 mg total) by mouth every 8 (eight) hours as needed for nausea or vomiting. 20 tablet 0   promethazine (PHENERGAN) 25 MG tablet Take 1 tablet (25 mg total) by mouth every 6 (six) hours as needed for nausea or vomiting. 30 tablet 0   scopolamine (TRANSDERM-SCOP) 1 MG/3DAYS Place 1 patch (1.5 mg total) onto the skin every 3 (three) days. 10 patch 12   No current facility-administered medications for this visit.    REVIEW OF SYSTEMS:   Constitutional: ( - ) fevers, ( - )  chills , ( - ) night sweats Eyes: ( - ) blurriness of vision, ( - ) double vision, ( - ) watery eyes Ears, nose, mouth, throat, and face: ( - ) mucositis, ( - ) sore throat Respiratory: ( - ) cough, ( - ) dyspnea, ( - ) wheezes Cardiovascular: ( - ) palpitation, ( - ) chest discomfort, ( - ) lower extremity swelling Gastrointestinal:  (-) nausea, ( - ) heartburn, ( -) change in bowel habits Skin: ( - ) abnormal skin rashes Lymphatics: ( - ) new lymphadenopathy, ( + ) easy bruising Neurological: ( - ) numbness, ( - ) tingling, ( - ) new weaknesses Behavioral/Psych: ( - ) mood change, ( - ) new changes  All other systems were reviewed with the patient and are negative.  PHYSICAL EXAMINATION: ECOG PERFORMANCE STATUS: 1 - Symptomatic but completely ambulatory  Vitals:   03/03/23 0841  BP:  128/73  Pulse: 75  Resp: 15  Temp: 98.1 F (36.7 C)  SpO2: 100%    Filed Weights   03/03/23 0841  Weight: 122 lb 11.2 oz (55.7 kg)     GENERAL: well appearing female in NAD  SKIN: skin color, texture, turgor are normal, no rashes or significant lesions.  EYES: conjunctiva are pink and non-injected, sclera clear LUNGS: clear to auscultation and percussion with normal breathing effort HEART: regular rate & rhythm and no murmurs and no lower extremity edema Musculoskeletal: no cyanosis of digits and no clubbing  PSYCH: alert & oriented x 3, fluent speech NEURO: no focal motor/sensory deficits  LABORATORY DATA:  I have reviewed the data as listed    Latest Ref Rng & Units 03/03/2023    8:24 AM 02/24/2023    7:58 AM 02/17/2023    8:30 AM  CBC  WBC 4.0 - 10.5 K/uL 4.1  3.9  6.8   Hemoglobin 12.0 - 15.0 g/dL 8.6  8.4  9.0   Hematocrit 36.0 - 46.0 % 26.8  26.6  28.3   Platelets 150 - 400 K/uL 134  71  169        Latest Ref Rng & Units 03/03/2023    8:24 AM 02/24/2023    7:58 AM 02/17/2023    8:30 AM  CMP  Glucose 70 - 99 mg/dL 161  096  045   BUN 6 - 20 mg/dL 10  19  13    Creatinine 0.44 - 1.00 mg/dL 4.09  8.11  9.14   Sodium 135 - 145 mmol/L 141  140  138   Potassium 3.5 - 5.1 mmol/L 3.9  3.6  3.6   Chloride 98 - 111 mmol/L 107  105  105   CO2 22 - 32 mmol/L 28  29  28    Calcium 8.9 - 10.3 mg/dL 9.2  9.1  9.3   Total Protein 6.5 - 8.1 g/dL 6.7  7.0  7.2   Total Bilirubin 0.0 - 1.2 mg/dL 0.4  0.3  0.4   Alkaline Phos 38 - 126 U/L 90  111  121   AST 15 - 41 U/L 17  29  16    ALT 0 - 44 U/L 12  23  11       RADIOGRAPHIC STUDIES: I have personally reviewed the radiological images as listed and agreed with the findings in the report. VAS [place] LOWER EXTREMITY VENOUS (DVT) Result Date: 02/17/2023  Lower Venous DVT Study Patient Name:  NIELLE DUFORD [name]  Date of Exam:   02/17/2023 Medical Rec #: [id number]                 Accession #:    [id number] Date of Birth: [birth year]                  Patient Gender: F Patient Age:   58 years Exam Location:  [facility] Procedure:      VAS [place] LOWER EXTREMITY VENOUS (DVT) Referring Phys: Jeanie Sewer --------------------------------------------------------------------------------  Indications: Swelling, and Pain.  Risk Factors: Cancer Gestational trophoblastic neoplasia past pregnancy. Comparison Study: None. Performing Technologist: Shona Simpson  Examination Guidelines: A complete evaluation includes B-mode imaging, spectral Doppler, color Doppler, and power Doppler as needed of all accessible portions of each vessel. Bilateral testing is considered an integral part of a complete examination. Limited examinations for reoccurring indications may be performed as noted. The reflux portion of the exam is performed with the patient in reverse Trendelenburg.  +-----+---------------+---------+-----------+----------+--------------+ RIGHTCompressibilityPhasicitySpontaneityPropertiesThrombus Aging +-----+---------------+---------+-----------+----------+--------------+ CFV  Full  Yes      Yes                                 +-----+---------------+---------+-----------+----------+--------------+   +---------+---------------+---------+-----------+----------+--------------+ LEFT     CompressibilityPhasicitySpontaneityPropertiesThrombus Aging +---------+---------------+---------+-----------+----------+--------------+ CFV      Full           Yes      Yes                                 +---------+---------------+---------+-----------+----------+--------------+ SFJ      Full                                                        +---------+---------------+---------+-----------+----------+--------------+ FV Prox  Full                                                        +---------+---------------+---------+-----------+----------+--------------+ FV Mid   Full                                                         +---------+---------------+---------+-----------+----------+--------------+ FV DistalFull                                                        +---------+---------------+---------+-----------+----------+--------------+ PFV      Full                                                        +---------+---------------+---------+-----------+----------+--------------+ POP      Full           Yes      Yes                                 +---------+---------------+---------+-----------+----------+--------------+ PTV      Full                                                        +---------+---------------+---------+-----------+----------+--------------+ PERO     Full                                                        +---------+---------------+---------+-----------+----------+--------------+    Summary:  RIGHT: - No evidence of common femoral vein obstruction.   LEFT: - There is no evidence of deep vein thrombosis in the lower extremity.  - No cystic structure found in the popliteal fossa. - Ultrasound characteristics of enlarged lymph nodes noted in the groin.  *See table(s) above for measurements and observations. Electronically signed by Lemar Livings MD on 02/17/2023 at 7:14:28 PM.    Final     ASSESSMENT & PLAN Lorelei Heikkila is a 58 y.o. female who returns to the clinic for newly diagnosed pancreatic adenocarcinoma.    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 Dr. Leonides Schanz recommends chemotherapy regimen with FOLFIRINOX q 2 weeks.  --Plan to obtain restaging CT scans every 3 months to assess treatment response.  --[facility] Testing: MS-stable, 2 Muts/Mb, ATMH675fs*31, KRASG12L, MDM2 amplification,  CDKN2A/B. No targetable mutations.  --Started mFOLFIRINOX on 03/30/2022 --After Cycle 3 on 05/04/2022, patient was on a treatment break due to poor tolerance and patient's request. --Most recent CT CAP from 06/30/2022 showed treatment response. --Patient resumed FOLFIRINOX without dose modification on 07/21/2022  --progression noted on CT scan from 10/20/2022, d/c FOLFIRINOX.  PLAN: --Due for Cycle 4, Day 8 Gem/Abraxane today.  --Labs from today reviewed with patient.  White blood cell 4.1, hemoglobin 8.6, MCV 89.3, platelets 134. Creatinine and LFTs in range. CA19-9 marker pending today but has been trending up.  -- dropped dose of Abraxane down to 100 mg/m and Gemcitabine down to 800 mg/m2, no further modifications at this time.  -- Return to clinic prior to Cycle 5 Day 1 Gem/Abraxane chemotherapy, we will obtain repeat CT imaging before then.    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  --Currently on Eliquis 5 mg BID  #Neutropenia --Neutropenic precautions given to patient including monitoring for fevers.   #Elevated LFTs --Most likely secondary to chemotherapy. LFTs today within normal limits. --Monitor for now  #Nausea/Vomiting: --Secondary to chemotherapy and well controlled.  -- Continue to take scopolamine patch, phenergan 25 mg q 6 hours, zofran 8mg  q8H PRN for nausea, zyprexa nightly PRN for nausea.   #Epigastric/RUQ/back pain: --Likely secondary to underlying malignancy --Tramadol was ineffective.  --Current pain regimen includes MS contin to 30 mg q 8 hours and Norco 10-325 mg.   #Appetite loss/weight loss: --Weight has improved since last week from 118 to 122 lsb today.  --Prescribed Marinol but is not currently taking it. --Encouraged to  eat small, frequent meals and supplement with protein shakes --Follows with [facility] nutrition team  #Family history of breast cancer: --Due to strong family history of cancer and has undergone genetic testing in the past.  --Underwent genetic testing on 04/07/2022 that was negative. 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   #Supportive Care -- chemotherapy education to be scheduled  -- port placement complete  -- EMLA cream for port  Orders Placed This Encounter  Procedures   CT CHEST ABDOMEN PELVIS W CONTRAST    Standing Status:   Future    Expected Date:   03/20/2023    Expiration Date:   03/02/2024    If indicated for  the ordered procedure, I authorize the administration of contrast media per Radiology protocol:   Yes    Does the patient have a contrast media/X-ray dye allergy?:   No    Is patient pregnant?:   No    Preferred imaging location?:   [facility]    If indicated for the ordered procedure, I authorize the administration of oral contrast media per Radiology protocol:   Yes   CBC with Differential (Cancer Center Only)    Standing Status:   Future    Number of Occurrences:   1    Expected Date:   03/03/2023    Expiration Date:   03/02/2024   CMP (Cancer Center only)    Standing Status:   Future    Number of Occurrences:   1    Expected Date:   03/03/2023    Expiration Date:   03/02/2024   CA 19.9    Standing Status:   Future    Expiration Date:   03/02/2024   All questions were answered. The patient knows to call the clinic with any problems, questions or concerns.  I have spent a total of 30 minutes minutes of face-to-face and non-face-to-face time, preparing to see the patient,  performing a medically appropriate examination, counseling and educating the patient, documenting clinical information in the electronic health record,  and care coordination.   Georga Kaufmann PA-C Dept of Hematology and Oncology [facility] at  [facility] Phone: [phone]

## 2023-03-03 ENCOUNTER — Encounter: Payer: Self-pay | Admitting: Hematology and Oncology

## 2023-03-03 ENCOUNTER — Inpatient Hospital Stay (HOSPITAL_BASED_OUTPATIENT_CLINIC_OR_DEPARTMENT_OTHER): Payer: BC Managed Care – PPO

## 2023-03-03 ENCOUNTER — Inpatient Hospital Stay (HOSPITAL_BASED_OUTPATIENT_CLINIC_OR_DEPARTMENT_OTHER): Payer: BC Managed Care – PPO | Admitting: Physician Assistant

## 2023-03-03 ENCOUNTER — Inpatient Hospital Stay: Payer: BC Managed Care – PPO

## 2023-03-03 VITALS — BP 128/73 | HR 75 | Temp 98.1°F | Resp 15 | Wt 122.7 lb

## 2023-03-03 DIAGNOSIS — C259 Malignant neoplasm of pancreas, unspecified: Secondary | ICD-10-CM | POA: Diagnosis not present

## 2023-03-03 DIAGNOSIS — C787 Secondary malignant neoplasm of liver and intrahepatic bile duct: Secondary | ICD-10-CM

## 2023-03-03 DIAGNOSIS — Z5111 Encounter for antineoplastic chemotherapy: Secondary | ICD-10-CM

## 2023-03-03 DIAGNOSIS — Z95828 Presence of other vascular implants and grafts: Secondary | ICD-10-CM

## 2023-03-03 LAB — CBC WITH DIFFERENTIAL (CANCER CENTER ONLY)
Abs Immature Granulocytes: 0.01 10*3/uL (ref 0.00–0.07)
Basophils Absolute: 0 10*3/uL (ref 0.0–0.1)
Basophils Relative: 1 %
Eosinophils Absolute: 0.1 10*3/uL (ref 0.0–0.5)
Eosinophils Relative: 3 %
HCT: 26.8 % — ABNORMAL LOW (ref 36.0–46.0)
Hemoglobin: 8.6 g/dL — ABNORMAL LOW (ref 12.0–15.0)
Immature Granulocytes: 0 %
Lymphocytes Relative: 22 %
Lymphs Abs: 0.9 10*3/uL (ref 0.7–4.0)
MCH: 28.7 pg (ref 26.0–34.0)
MCHC: 32.1 g/dL (ref 30.0–36.0)
MCV: 89.3 fL (ref 80.0–100.0)
Monocytes Absolute: 0.6 10*3/uL (ref 0.1–1.0)
Monocytes Relative: 14 %
Neutro Abs: 2.5 10*3/uL (ref 1.7–7.7)
Neutrophils Relative %: 60 %
Platelet Count: 134 10*3/uL — ABNORMAL LOW (ref 150–400)
RBC: 3 MIL/uL — ABNORMAL LOW (ref 3.87–5.11)
RDW: 16.6 % — ABNORMAL HIGH (ref 11.5–15.5)
WBC Count: 4.1 10*3/uL (ref 4.0–10.5)
nRBC: 0 % (ref 0.0–0.2)

## 2023-03-03 LAB — CMP (CANCER CENTER ONLY)
ALT: 12 U/L (ref 0–44)
AST: 17 U/L (ref 15–41)
Albumin: 3.7 g/dL (ref 3.5–5.0)
Alkaline Phosphatase: 90 U/L (ref 38–126)
Anion gap: 6 (ref 5–15)
BUN: 10 mg/dL (ref 6–20)
CO2: 28 mmol/L (ref 22–32)
Calcium: 9.2 mg/dL (ref 8.9–10.3)
Chloride: 107 mmol/L (ref 98–111)
Creatinine: 0.55 mg/dL (ref 0.44–1.00)
GFR, Estimated: >60 mL/min (ref >60)
Glucose, Bld: 122 mg/dL — ABNORMAL HIGH (ref 70–99)
Potassium: 3.9 mmol/L (ref 3.5–5.1)
Sodium: 141 mmol/L (ref 135–145)
Total Bilirubin: 0.4 mg/dL (ref 0.0–1.2)
Total Protein: 6.7 g/dL (ref 6.5–8.1)

## 2023-03-03 MED ORDER — HEPARIN SOD (PORK) LOCK FLUSH 100 UNIT/ML IV SOLN
500.0000 [IU] | Freq: Once | INTRAVENOUS | Status: DC | PRN
Start: 2023-03-03 — End: 2023-03-03

## 2023-03-03 MED ORDER — SODIUM CHLORIDE 0.9% FLUSH
10.0000 mL | Freq: Once | INTRAVENOUS | Status: AC
  Administered 2023-03-03: 10 mL

## 2023-03-03 MED ORDER — SODIUM CHLORIDE 0.9% FLUSH: 10.0000 mL | INTRAVENOUS | Status: DC | PRN

## 2023-03-03 MED ORDER — PACLITAXEL PROTEIN-BOUND CHEMO INJECTION 100 MG
100.0000 mg/m2 | Freq: Once | INTRAVENOUS | Status: AC
  Administered 2023-03-03: 150 mg via INTRAVENOUS
  Filled 2023-03-03: qty 30

## 2023-03-03 MED ORDER — SODIUM CHLORIDE 0.9 % IV SOLN: Freq: Once | INTRAVENOUS | Status: AC

## 2023-03-03 MED ORDER — ONDANSETRON HCL 4 MG/2ML IJ SOLN
8.0000 mg | Freq: Once | INTRAMUSCULAR | Status: AC
  Administered 2023-03-03: 8 mg via INTRAVENOUS
  Filled 2023-03-03: qty 4

## 2023-03-03 MED ORDER — SODIUM CHLORIDE 0.9 % IV SOLN
800.0000 mg/m2 | Freq: Once | INTRAVENOUS | Status: AC
  Administered 2023-03-03: 1216 mg via INTRAVENOUS
  Filled 2023-03-03: qty 31.98

## 2023-03-03 NOTE — Patient Instructions (Signed)
[facility] - A DEPT OF [facility]  Discharge Instructions: Thank you for choosing [facility] to provide your oncology and hematology care.   If you have a lab appointment with the Cancer Center, please go directly to the Cancer Center and check in at the registration area.   Wear comfortable clothing and clothing appropriate for easy access to any Portacath or PICC line.   We strive to give you quality time with your provider. You may need to reschedule your appointment if you arrive late (15 or more minutes).  Arriving late affects you and other patients whose appointments are after yours.  Also, if you miss three or more appointments without notifying the office, you may be dismissed from the clinic at the provider's discretion.      For prescription refill requests, have your pharmacy contact our office and allow 72 hours for refills to be completed.    Today you received the following chemotherapy and/or immunotherapy agents: Abraxane, Gemzar      To help prevent nausea and vomiting after your treatment, we encourage you to take your nausea medication as directed.  BELOW ARE SYMPTOMS THAT SHOULD BE REPORTED IMMEDIATELY: *FEVER GREATER THAN 100.4 F (38 C) OR HIGHER *CHILLS OR SWEATING *NAUSEA AND VOMITING THAT IS NOT CONTROLLED WITH YOUR NAUSEA MEDICATION *UNUSUAL SHORTNESS OF BREATH *UNUSUAL BRUISING OR BLEEDING *URINARY PROBLEMS (pain or burning when urinating, or frequent urination) *BOWEL PROBLEMS (unusual diarrhea, constipation, pain near the anus) TENDERNESS IN MOUTH AND THROAT WITH OR WITHOUT PRESENCE OF ULCERS (sore throat, sores in mouth, or a toothache) UNUSUAL RASH, SWELLING OR PAIN  UNUSUAL VAGINAL DISCHARGE OR ITCHING   Items with * indicate a potential emergency and should be followed up as soon as possible or go to the Emergency Department if any problems should occur.  Please show the CHEMOTHERAPY ALERT CARD or  IMMUNOTHERAPY ALERT CARD at check-in to the Emergency Department and triage nurse.  Should you have questions after your visit or need to cancel or reschedule your appointment, please contact [facility] - A DEPT OF [facility]  Dept: [phone]  and follow the prompts.  Office hours are 8:00 a.m. to 4:30 p.m. Monday - Friday. Please note that voicemails left after 4:00 p.m. may not be returned until the following business day.  We are closed weekends and major holidays. You have access to a nurse at all times for urgent questions. Please call the main number to the clinic Dept: [phone] and follow the prompts.   For any non-urgent questions, you may also contact your provider using MyChart. We now offer e-Visits for anyone 75 and older to request care online for non-urgent symptoms. For details visit [email or web address].   Also download the MyChart app! Go to the app store, search "MyChart", open the app, select [place], and log in with your MyChart username and password.

## 2023-03-04 LAB — CANCER ANTIGEN 19-9: CA 19-9: 737 U/mL — ABNORMAL HIGH (ref 0–35)

## 2023-03-06 ENCOUNTER — Encounter: Payer: Self-pay | Admitting: Physician Assistant

## 2023-03-10 ENCOUNTER — Inpatient Hospital Stay: Payer: BC Managed Care – PPO | Admitting: Physician Assistant

## 2023-03-10 ENCOUNTER — Inpatient Hospital Stay: Payer: BC Managed Care – PPO

## 2023-03-10 ENCOUNTER — Other Ambulatory Visit: Payer: Self-pay | Admitting: Physician Assistant

## 2023-03-10 VITALS — BP 120/70 | HR 63 | Temp 98.0°F | Resp 18 | Wt 116.8 lb

## 2023-03-10 DIAGNOSIS — C259 Malignant neoplasm of pancreas, unspecified: Secondary | ICD-10-CM

## 2023-03-10 DIAGNOSIS — Z5111 Encounter for antineoplastic chemotherapy: Secondary | ICD-10-CM | POA: Diagnosis not present

## 2023-03-10 DIAGNOSIS — Z95828 Presence of other vascular implants and grafts: Secondary | ICD-10-CM

## 2023-03-10 LAB — CBC WITH DIFFERENTIAL (CANCER CENTER ONLY)
Abs Immature Granulocytes: 0 10*3/uL (ref 0.00–0.07)
Basophils Absolute: 0 10*3/uL (ref 0.0–0.1)
Basophils Relative: 1 %
Eosinophils Absolute: 0.1 10*3/uL (ref 0.0–0.5)
Eosinophils Relative: 4 %
HCT: 26.5 % — ABNORMAL LOW (ref 36.0–46.0)
Hemoglobin: 8.4 g/dL — ABNORMAL LOW (ref 12.0–15.0)
Immature Granulocytes: 0 %
Lymphocytes Relative: 49 %
Lymphs Abs: 1.1 10*3/uL (ref 0.7–4.0)
MCH: 28.4 pg (ref 26.0–34.0)
MCHC: 31.7 g/dL (ref 30.0–36.0)
MCV: 89.5 fL (ref 80.0–100.0)
Monocytes Absolute: 0.3 10*3/uL (ref 0.1–1.0)
Monocytes Relative: 13 %
Neutro Abs: 0.7 10*3/uL — ABNORMAL LOW (ref 1.7–7.7)
Neutrophils Relative %: 33 %
Platelet Count: 97 10*3/uL — ABNORMAL LOW (ref 150–400)
RBC: 2.96 MIL/uL — ABNORMAL LOW (ref 3.87–5.11)
RDW: 15.8 % — ABNORMAL HIGH (ref 11.5–15.5)
WBC Count: 2.2 10*3/uL — ABNORMAL LOW (ref 4.0–10.5)
nRBC: 0 % (ref 0.0–0.2)

## 2023-03-10 LAB — CMP (CANCER CENTER ONLY)
ALT: 21 U/L (ref 0–44)
AST: 28 U/L (ref 15–41)
Albumin: 3.9 g/dL (ref 3.5–5.0)
Alkaline Phosphatase: 106 U/L (ref 38–126)
Anion gap: 4 — ABNORMAL LOW (ref 5–15)
BUN: 12 mg/dL (ref 6–20)
CO2: 29 mmol/L (ref 22–32)
Calcium: 9 mg/dL (ref 8.9–10.3)
Chloride: 108 mmol/L (ref 98–111)
Creatinine: 0.55 mg/dL (ref 0.44–1.00)
GFR, Estimated: >60 mL/min (ref >60)
Glucose, Bld: 110 mg/dL — ABNORMAL HIGH (ref 70–99)
Potassium: 3.6 mmol/L (ref 3.5–5.1)
Sodium: 141 mmol/L (ref 135–145)
Total Bilirubin: 0.3 mg/dL (ref 0.0–1.2)
Total Protein: 7.2 g/dL (ref 6.5–8.1)

## 2023-03-10 MED ORDER — HEPARIN SOD (PORK) LOCK FLUSH 100 UNIT/ML IV SOLN
500.0000 [IU] | Freq: Once | INTRAVENOUS | Status: AC
  Administered 2023-03-10: 500 [IU]

## 2023-03-10 MED ORDER — SODIUM CHLORIDE 0.9% FLUSH
10.0000 mL | Freq: Once | INTRAVENOUS | Status: AC
  Administered 2023-03-10: 10 mL

## 2023-03-17 ENCOUNTER — Ambulatory Visit: Payer: BC Managed Care – PPO | Admitting: Dietician

## 2023-03-17 ENCOUNTER — Other Ambulatory Visit: Payer: BC Managed Care – PPO

## 2023-03-17 ENCOUNTER — Ambulatory Visit: Payer: BC Managed Care – PPO

## 2023-03-17 ENCOUNTER — Ambulatory Visit: Payer: BC Managed Care – PPO | Admitting: Physician Assistant

## 2023-03-17 ENCOUNTER — Inpatient Hospital Stay: Payer: BC Managed Care – PPO | Admitting: Dietician

## 2023-03-17 NOTE — Progress Notes (Signed)
 Nutrition Follow-up:  Patient with pancreatic carcinoma metastatic to liver. She is currently receiving gemcitabine /abraxane  q28d.    Met with patient and husband in waiting area. Treatment was rescheduled secondary to recent passing of patients mother and father. Patient reports not eating as well as she should due to this. She has been responsible for planning both funerals and on the go right now. Patient endorses episode of vomiting this morning. Husband feels this is related to the pizza and corn dog eaten last night for dinner. Patient denies N/V at visit. She has not been drinking CIB consistently. Patient recently tried this with vanilla almond milk. It was not good.    Medications: reviewed   Labs: 1/31 - reviewed   Anthropometrics: Last wt 116 lb 12.8 oz on 1/31 decreased 5% in 7 days - this is severe for time frame  1/24 - 122 lb 11.2 oz 1/17 - 119 lb 12 oz 1/10 - 118 lb 11.2 oz 12/27 - 121 lb 11.2 oz    NUTRITION DIAGNOSIS: Unintentional wt loss continues    INTERVENTION:  Suggested packing a snack bag since she is spending less time at home more recently. Offered ideas (protein bars, PB/jelly sandwich, pimento cheese/crackers) Patient agreeable to resume CIB with fairlife milk, encouraged 1-2/day as tolerated Supportive listening Educated on support services available - pt and husband appreciative     MONITORING, EVALUATION, GOAL: wt trends, intake   NEXT VISIT: Thursday February 13 during infusion

## 2023-03-20 ENCOUNTER — Ambulatory Visit (HOSPITAL_COMMUNITY)
Admission: RE | Admit: 2023-03-20 | Discharge: 2023-03-20 | Disposition: A | Discharge status: Home / Self Care | Payer: BC Managed Care – PPO | Source: Ambulatory Visit | Attending: Physician Assistant | Admitting: Physician Assistant

## 2023-03-20 ENCOUNTER — Encounter (HOSPITAL_COMMUNITY): Payer: Self-pay

## 2023-03-20 DIAGNOSIS — C787 Secondary malignant neoplasm of liver and intrahepatic bile duct: Secondary | ICD-10-CM | POA: Diagnosis not present

## 2023-03-20 DIAGNOSIS — I700 Atherosclerosis of aorta: Secondary | ICD-10-CM | POA: Diagnosis not present

## 2023-03-20 DIAGNOSIS — C259 Malignant neoplasm of pancreas, unspecified: Secondary | ICD-10-CM | POA: Diagnosis not present

## 2023-03-20 MED ORDER — IOHEXOL 300 MG/ML  SOLN
100.0000 mL | Freq: Once | INTRAMUSCULAR | Status: AC | PRN
  Administered 2023-03-20: 100 mL via INTRAVENOUS

## 2023-03-20 NOTE — Progress Notes (Signed)
Palliative Medicine [facility]  Telephone:(336) [phone] Fax:([phone]) [phone]   Name: Kaitlin Ferrell Date: 03/20/2023 MRN: [id number]  DOB: 09-Aug-1965  Patient Care Team: Bernadette Hoit, MD as PCP - General (Family Medicine) Pickenpack-Cousar, Arty Baumgartner, NP as Nurse Practitioner (Nurse Practitioner) Jaci Standard, MD as Consulting Physician (Hematology and Oncology)    INTERVAL HISTORY: Collen Ferrell is a 58 y.o. 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 Other history includes gestational trophoblastic disease, insomnia, HTN, and migraines. Palliative ask to see for symptom and pain management and goals of care.   SOCIAL HISTORY:     reports that she has never smoked. She has never used smokeless tobacco. She reports current alcohol use of about 1.0 standard drink of alcohol per week. She reports that she does not use drugs.  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  MEDICATIONS:  Current Outpatient Medications  Medication Sig Dispense Refill   Cyanocobalamin (VITAMIN B-12 CR PO) Take by mouth.     diphenoxylate-atropine (LOMOTIL) 2.5-0.025 MG tablet Take 1 tablet by mouth 4 (four) times daily as needed for diarrhea or loose stools. 30 tablet 0   eletriptan (RELPAX) 40 MG tablet Take 1 tablet (40 mg total) by mouth as  needed for migraine or headache. May repeat in 2 hours if needed. 15 tablet 6   Folic Acid (FOLATE PO) Place 1 mg under the tongue daily.     lidocaine-prilocaine (EMLA) cream Apply 1 Application topically as needed. 30 g 2   LORazepam (ATIVAN) 0.5 MG tablet Take 0.5 mg by mouth as needed for anxiety.     magic mouthwash (multi-ingredient) oral suspension Swish and swallow 5 mLs by mouth 4 times daily as needed for throat discomfort 400 mL 1   magnesium oxide (MAG-OX) 400 (241.3 MG) MG tablet TAKE 1 TABLET BY MOUTH TWICE A DAY 60 tablet 6   morphine (MS CONTIN) 15 MG 12 hr tablet Take 1 tablet (15 mg total) by mouth every 8 (eight) hours. Take in addition to MS Contin 30 mg to total 45mg  every 8 hours. 90 tablet 0   morphine (MS CONTIN) 30 MG 12 hr tablet Take 1 tablet (30 mg total) by mouth every 8 (eight) hours. 90 tablet 0   morphine (MSIR) 15 MG tablet Take 1 tablet (15 mg total) by mouth every 4 (four) hours as needed for severe pain (pain score 7-10) or moderate pain (pain score 4-6). 60 tablet 0   OLANZapine (ZYPREXA) 10 MG tablet TAKE 1 TABLET BY MOUTH EVERYDAY AT BEDTIME 90 tablet 1   ondansetron (ZOFRAN) 8 MG tablet Take 1 tablet (8 mg total) by mouth every 8 (eight) hours as needed for nausea or vomiting. 90 tablet 0   ondansetron (ZOFRAN-ODT) 8 MG disintegrating tablet Take 1 tablet (8 mg total) by mouth every 8 (eight) hours as needed for nausea or vomiting. 20  tablet 0   promethazine (PHENERGAN) 25 MG tablet Take 1 tablet (25 mg total) by mouth every 6 (six) hours as needed for nausea or vomiting. 30 tablet 0   scopolamine (TRANSDERM-SCOP) 1 MG/3DAYS Place 1 patch (1.5 mg total) onto the skin every 3 (three) days. 10 patch 12   No current facility-administered medications for this visit.    VITAL SIGNS: LMP 06/26/2013 Comment: low dose BCP There were no vitals filed for this visit.  Estimated body mass index is 21.36 kg/m as calculated from the following:   Height as of 02/24/23:  5\' 2"  (1.575 m).   Weight as of 03/10/23: 116 lb 12.8 oz (53 kg).   PERFORMANCE STATUS (ECOG) : 1 - Symptomatic but completely ambulatory   Physical Exam General: NAD Cardiovascular: regular rate  Extremities: no edema, no joint deformities Skin: no rashes Neurological: AAO x4  Discussed the use of AI scribe software for clinical note transcription with the patient, who gave verbal consent to proceed.  IMPRESSION:  I saw [name] during her infusion today. Tolerating without difficulty. States overall she is doing well. Denies concerns with nausea, vomiting, constipation, or diarrhea. Manages constipation with stool softeners. Appetite has improved. Is appreciative that she has gained 3 lbs with weight up to 119lbs today from 116lbs on 1/31. She mentions experiencing significant hot flashes, describing them as occurring frequently, despite feeling cold otherwise.   She is experiencing emotional distress following the recent loss of her mother and father within a short period. She has difficulty sleeping, stating that her mind is unsettled. This is attributed to the recent loss of both her parents, with her mother passing away 24 hours after entering hospice and her father passing nine days later while in a nursing home. This bereavement is contributing to her insomnia and overall mental unrest. Emotional support provided. We discussed ways to assist in her insomnia including restarting magnesium and use of melatonin.   Mrs. Fraser reports her pain is well controlled on current regimen. She is taking MS Contin every 8 hours and MS IR as needed for breakthrough pain. Does not require around the clock. No changes to regimen at this time.   We will continue to closely monitor and support as needed.   Goals of Care  07/07/22- We discussed her current illness and what it means in the larger context of her on-going co-morbidities. Natural disease trajectory and expectations  were discussed.   Patient and husband are realistic in their understanding of her cancer diagnosis and trajectory. She shares they have also discussed at length with her children. Patient states she chose to pursue chemotherapy break due to significance of her symptoms. She was extremely fatigue, weak, could not get out of bed, no appetite, weight loss, nausea, vomiting, and diarrhea. [name] shares she was unable to perform ADLs and husband was having to bathe her. They were concerned that she was facing end-of-life given how poor her quality of life was. Patient and husband speaks to appreciation of her improvement in quality of life over the past several weeks.    I created space and opportunity for patient and husband to discuss goals and hopes. Patient is trying to focus on healthy food options and be as active as she can. They speak to plans of watchful waiting to see what upcoming CT scan results show in terms of her cancer. She will then consider if she would like to pursue additional treatment options versus not. They are clear  in their understanding what pros and cons are of pursing or not to pursue further treatment including end-of-life. [name] speaks to her quality of life is most important over quantity. She would not want to spend last moments in a stupor suffering state. Emotional support provided.   We discussed Her current illness and what it means in the larger context of Her on-going co-morbidities. Natural disease trajectory and expectations were discussed.  I discussed the importance of continued conversation with family and their medical providers regarding overall plan of care and treatment options, ensuring decisions are within the context of the patients values and GOCs.  Assessment and Plan  Insomnia Recent bereavement with loss of both parents. Difficulty sleeping due to racing thoughts. -Resume Magnesium supplement. -Consider starting Melatonin at 2mg , can increase to  5mg  if needed. -Notify medical team if continues to have needs or requires emotional support.  Hot Flashes Sudden onset of hot flashes, possibly related to menopause/medications. -Magnesium and Melatonin may also help with hot flashes.  Medication Refills Request for refills on Morphine and MS IR medication.  I will plan to see back in 4-6 weeks. Sooner if needed.  Patient expressed understanding and was in agreement with this plan. She also understands that She can call the clinic at any time with any questions, concerns, or complaints.   Any controlled substances utilized were prescribed in the context of palliative care. PDMP has been reviewed.   Visit consisted of counseling and education dealing with the complex and emotionally intense issues of symptom management and palliative care in the setting of serious and potentially life-threatening illness.  Willette Alma, AGPCNP-BC  Palliative Medicine Team/[facility]

## 2023-03-23 ENCOUNTER — Inpatient Hospital Stay (HOSPITAL_BASED_OUTPATIENT_CLINIC_OR_DEPARTMENT_OTHER): Payer: BC Managed Care – PPO | Admitting: Hematology and Oncology

## 2023-03-23 ENCOUNTER — Inpatient Hospital Stay: Payer: BC Managed Care – PPO | Admitting: Dietician

## 2023-03-23 ENCOUNTER — Encounter: Payer: Self-pay | Admitting: Hematology and Oncology

## 2023-03-23 ENCOUNTER — Inpatient Hospital Stay: Payer: BC Managed Care – PPO | Attending: Physician Assistant | Admitting: Nurse Practitioner

## 2023-03-23 ENCOUNTER — Other Ambulatory Visit: Payer: BC Managed Care – PPO

## 2023-03-23 ENCOUNTER — Ambulatory Visit: Payer: BC Managed Care – PPO

## 2023-03-23 ENCOUNTER — Encounter: Payer: Self-pay | Admitting: Nurse Practitioner

## 2023-03-23 ENCOUNTER — Inpatient Hospital Stay: Payer: BC Managed Care – PPO

## 2023-03-23 ENCOUNTER — Inpatient Hospital Stay (HOSPITAL_BASED_OUTPATIENT_CLINIC_OR_DEPARTMENT_OTHER): Payer: BC Managed Care – PPO

## 2023-03-23 VITALS — BP 135/72 | HR 68 | Temp 98.5°F | Resp 17 | Wt 119.5 lb

## 2023-03-23 DIAGNOSIS — R7989 Other specified abnormal findings of blood chemistry: Secondary | ICD-10-CM | POA: Diagnosis not present

## 2023-03-23 DIAGNOSIS — G893 Neoplasm related pain (acute) (chronic): Secondary | ICD-10-CM

## 2023-03-23 DIAGNOSIS — C259 Malignant neoplasm of pancreas, unspecified: Secondary | ICD-10-CM | POA: Diagnosis not present

## 2023-03-23 DIAGNOSIS — R530 Neoplastic (malignant) related fatigue: Secondary | ICD-10-CM

## 2023-03-23 DIAGNOSIS — M549 Dorsalgia, unspecified: Secondary | ICD-10-CM | POA: Insufficient documentation

## 2023-03-23 DIAGNOSIS — D709 Neutropenia, unspecified: Secondary | ICD-10-CM | POA: Diagnosis not present

## 2023-03-23 DIAGNOSIS — Z452 Encounter for adjustment and management of vascular access device: Secondary | ICD-10-CM | POA: Diagnosis not present

## 2023-03-23 DIAGNOSIS — Z95828 Presence of other vascular implants and grafts: Secondary | ICD-10-CM | POA: Diagnosis not present

## 2023-03-23 DIAGNOSIS — Z5111 Encounter for antineoplastic chemotherapy: Secondary | ICD-10-CM | POA: Diagnosis not present

## 2023-03-23 DIAGNOSIS — I81 Portal vein thrombosis: Secondary | ICD-10-CM | POA: Insufficient documentation

## 2023-03-23 DIAGNOSIS — C787 Secondary malignant neoplasm of liver and intrahepatic bile duct: Secondary | ICD-10-CM | POA: Diagnosis not present

## 2023-03-23 DIAGNOSIS — N951 Menopausal and female climacteric states: Secondary | ICD-10-CM | POA: Diagnosis not present

## 2023-03-23 DIAGNOSIS — G4700 Insomnia, unspecified: Secondary | ICD-10-CM | POA: Diagnosis not present

## 2023-03-23 DIAGNOSIS — Z7901 Long term (current) use of anticoagulants: Secondary | ICD-10-CM | POA: Insufficient documentation

## 2023-03-23 DIAGNOSIS — R112 Nausea with vomiting, unspecified: Secondary | ICD-10-CM | POA: Diagnosis not present

## 2023-03-23 DIAGNOSIS — C250 Malignant neoplasm of head of pancreas: Secondary | ICD-10-CM | POA: Diagnosis not present

## 2023-03-23 DIAGNOSIS — Z515 Encounter for palliative care: Secondary | ICD-10-CM | POA: Diagnosis not present

## 2023-03-23 LAB — CBC WITH DIFFERENTIAL (CANCER CENTER ONLY)
Abs Immature Granulocytes: 0.02 10*3/uL (ref 0.00–0.07)
Basophils Absolute: 0 10*3/uL (ref 0.0–0.1)
Basophils Relative: 0 %
Eosinophils Absolute: 0.1 10*3/uL (ref 0.0–0.5)
Eosinophils Relative: 2 %
HCT: 29.1 % — ABNORMAL LOW (ref 36.0–46.0)
Hemoglobin: 9.2 g/dL — ABNORMAL LOW (ref 12.0–15.0)
Immature Granulocytes: 0 %
Lymphocytes Relative: 17 %
Lymphs Abs: 0.8 10*3/uL (ref 0.7–4.0)
MCH: 28.4 pg (ref 26.0–34.0)
MCHC: 31.6 g/dL (ref 30.0–36.0)
MCV: 89.8 fL (ref 80.0–100.0)
Monocytes Absolute: 0.6 10*3/uL (ref 0.1–1.0)
Monocytes Relative: 12 %
Neutro Abs: 3.2 10*3/uL (ref 1.7–7.7)
Neutrophils Relative %: 69 %
Platelet Count: 170 10*3/uL (ref 150–400)
RBC: 3.24 MIL/uL — ABNORMAL LOW (ref 3.87–5.11)
RDW: 16.4 % — ABNORMAL HIGH (ref 11.5–15.5)
WBC Count: 4.8 10*3/uL (ref 4.0–10.5)
nRBC: 0 % (ref 0.0–0.2)

## 2023-03-23 LAB — CMP (CANCER CENTER ONLY)
ALT: 12 U/L (ref 0–44)
AST: 18 U/L (ref 15–41)
Albumin: 3.6 g/dL (ref 3.5–5.0)
Alkaline Phosphatase: 90 U/L (ref 38–126)
Anion gap: 4 — ABNORMAL LOW (ref 5–15)
BUN: 10 mg/dL (ref 6–20)
CO2: 29 mmol/L (ref 22–32)
Calcium: 9 mg/dL (ref 8.9–10.3)
Chloride: 105 mmol/L (ref 98–111)
Creatinine: 0.54 mg/dL (ref 0.44–1.00)
GFR, Estimated: >60 mL/min (ref >60)
Glucose, Bld: 116 mg/dL — ABNORMAL HIGH (ref 70–99)
Potassium: 3.6 mmol/L (ref 3.5–5.1)
Sodium: 138 mmol/L (ref 135–145)
Total Bilirubin: 0.3 mg/dL (ref 0.0–1.2)
Total Protein: 7.1 g/dL (ref 6.5–8.1)

## 2023-03-23 MED ORDER — SODIUM CHLORIDE 0.9% FLUSH
10.0000 mL | Freq: Once | INTRAVENOUS | Status: AC
  Administered 2023-03-23: 10 mL

## 2023-03-23 MED ORDER — MORPHINE SULFATE 15 MG PO TABS: 15.0000 mg | ORAL_TABLET | ORAL | 0 refills | Status: DC | PRN

## 2023-03-23 MED ORDER — SODIUM CHLORIDE 0.9 % IV SOLN
800.0000 mg/m2 | Freq: Once | INTRAVENOUS | Status: AC
  Administered 2023-03-23: 1216 mg via INTRAVENOUS
  Filled 2023-03-23: qty 31.98

## 2023-03-23 MED ORDER — PACLITAXEL PROTEIN-BOUND CHEMO INJECTION 100 MG
100.0000 mg/m2 | Freq: Once | INTRAVENOUS | Status: AC
  Administered 2023-03-23: 150 mg via INTRAVENOUS
  Filled 2023-03-23: qty 30

## 2023-03-23 MED ORDER — ONDANSETRON HCL 4 MG/2ML IJ SOLN
8.0000 mg | Freq: Once | INTRAMUSCULAR | Status: AC
  Administered 2023-03-23: 8 mg via INTRAVENOUS
  Filled 2023-03-23: qty 4

## 2023-03-23 MED ORDER — SODIUM CHLORIDE 0.9 % IV SOLN: Freq: Once | INTRAVENOUS | Status: AC

## 2023-03-23 MED ORDER — MORPHINE SULFATE ER 30 MG PO TBCR: 30.0000 mg | EXTENDED_RELEASE_TABLET | Freq: Three times a day (TID) | ORAL | 0 refills | Status: DC

## 2023-03-23 MED ORDER — MORPHINE SULFATE ER 15 MG PO TBCR
15.0000 mg | EXTENDED_RELEASE_TABLET | Freq: Three times a day (TID) | ORAL | 0 refills | Status: DC
Start: 2023-03-23 — End: 2023-04-13

## 2023-03-23 NOTE — Progress Notes (Signed)
[facility] [phone]   [phone]  PROGRESS NOTE:  Patient Care Team: Bernadette Hoit, MD as PCP - General (Family Medicine) Pickenpack-Cousar, Arty Baumgartner, NP as Nurse Practitioner (Nurse Practitioner) Jaci Standard, MD as Consulting Physician (Hematology and Oncology)  CHIEF COMPLAINTS/PURPOSE OF CONSULTATION:  Metastatic pancreatic adenocarcinoma involving the liver  ONCOLOGIC HISTORY: Presented with nausea and right upper quadrant pain 03/08/2022: Abdominal US showed multiple hypoechoic masses within the liver are indeterminate.  03/10/2022: MR abdomen: Hypoenhancing mass compatible with pancreatic head adenocarcinoma with numerous targetoid enhancing masses in all segments of the liver, compatible with metastatic disease. The mass severely effaces and nearly occludes the portal vein and splenic vein, and occludes the superior mesenteric vein with some collaterals noted. The mass substantially abuts the proximal transverse duodenum, although overt duodenal invasion is indeterminate. 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 Prominent stool throughout the colon favors constipation. Fluid-fluid level in the gallbladder likely from sludge. 03/14/2022: Establish care with [facility] Hematology/Oncology 03/17/2022: CT chest: no definitive evidence of lung metastases.  03/22/2022: Liver biopsy confirmed metastatic adenocarcinoma, pancreatic primary.  03/30/2022: Cycle 1, Day 1 of mFOLFIRINOX 04/14/2022: Cycle 2, Day 1 of mFOLFIRINOX HELD due to ANC 300.  04/20/2022: Cycle 2, Day 1 of mFOLFIRINOX 05/04/2022: Cycle 3, Day 1 of  mFOLFIRINOX 05/19/2022: Cycle 4, Day 1 of mFOLFIRINOX HELD per patient request.  07/21/2022: Cycle 4, Day 1 of mFOLFIRINOX 08/04/2022: Cycle 5, Day 1 of mFOLFIRINOX HELD due to Plt 81K 08/17/2022: Cycle 5, Day 1 of mFOLFIRINOX 09/01/2022: Cycle 6, Day 1 of mFOLFIRINOX 09/14/2022: Cycle 7, Day 1 of mFOLFIRINOX HELD due to Plt 31K 09/22/2022: Cycle 7, Day 1 of mFOLFIRINOX HELD due to Plt 73K 10/20/2022 CT C/A/P showed mixed picture with progression of disease. FOLFIRINOX d/c.  11/10/2022: Cycle 1 Day 1 of Gemcitabine/Abraxane.  11/24/2022: treatment held due to cytopenias.  12/23/2022: Cycle 2 Day 8 of Gemcitabine (reduced to 800 mg /m2) and Abraxane (reduced to 100 mg)    HISTORY OF PRESENTING ILLNESS:  Kaitlin Ferrell presents today for a follow up. She was last seen on 03/03/2023.  Patient presents today prior to Cycle 5 Day 1 of Gem/Abraxane.   [name] reports she has been well overall in the interim since her last visit.  She is delighted that she has gained 3 pounds worth of new weight.  She reports her energy levels are good and she is not having any lightheadedness, dizziness, shortness of breath.  She denies any fevers, chills, sweats.  She denies any bleeding, bruising, or dark stools.  She reports that she has not had any trouble with nausea or vomiting during her last cycle, though she did have 1 episode of "random vomiting" that was not associated with nausea.  She reports that she is not currently experiencing any pain anywhere.  She denies any numbness or tingling of her fingers or toes. Overall she is willing and able to proceed with chemotherapy treatment today.  She denies fevers, chills, sweats, shortness of breath, chest pain or cough. She has no other complaints. Rest of the 10 point ROS is below.    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   Family history of breast cancer    Fibrocystic  breast changes    GTD (gestational trophoblastic disease) 02/08/2011   treated with 4 cycles of actinomycin D from 12-02-11 thru 01-13-12   H/O molar pregnancy, antepartum    Hypertension    Migraine    Miscarriage     SURGICAL HISTORY: Past Surgical History:  Procedure Laterality Date   BREAST BIOPSY  2001   Left   BREAST LUMPECTOMY     DILATION AND CURETTAGE OF UTERUS  1992, 09/2011   DILATION AND EVACUATION  2011   INSERTION OF MESH N/A 09/03/2013   Procedure: INSERTION OF MESH;  Surgeon: Ardeth Sportsman, MD;  Location: [facility];  Service: General;  Laterality: N/A;   IR CHEST FLUORO  04/14/2022   IR IMAGING GUIDED PORT INSERTION  03/22/2022   IR IMAGING GUIDED PORT INSERTION  05/02/2022   IR PORT REPAIR CENTRAL VENOUS ACCESS DEVICE  04/19/2022   IR REMOVAL TUN CV CATH W/O FL  05/02/2022   IR US LIVER BIOPSY  03/22/2022   PORTACATH PLACEMENT  11/2011   PORTACATH PLACEMENT     REMOVAL  MARCH 2014   VENTRAL HERNIA REPAIR N/A 09/03/2013   Procedure: LAPAROSCOPIC VENTRAL WALL HERNIA REPAIR;  Surgeon: Ardeth Sportsman, MD;  Location: [facility];  Service: General;  Laterality: N/A;   WISDOM TOOTH EXTRACTION     in 11th grade    SOCIAL HISTORY: Social History   Socioeconomic History   Marital status: Married    Spouse name: [name]   Number of children: 2   Years of education: college   Highest education level: Not on file  Occupational History    Comment: [employer]  Tobacco Use   Smoking status: Never   Smokeless tobacco: Never  Substance and Sexual Activity   Alcohol use: Yes    Alcohol/week: 1.0 standard drink of alcohol    Types: 1 Glasses of wine per week    Comment: occas   Drug use: No   Sexual activity: Not Currently    Birth control/protection: Pill  Other Topics Concern   Not on file  Social History Narrative   Patient is married [name]). Patient works part time at [employer].   Right handed.   Patient has her CMA.   Caffeine- None         Social Drivers of  Corporate investment banker Strain: Not on file  Food Insecurity: No Food Insecurity (05/10/2022)   Hunger Vital Sign    Worried About Running Out of Food in the Last Year: Never true    Ran Out of Food in the Last Year: Never true  Transportation Needs: No Transportation Needs (05/10/2022)   PRAPARE - Administrator, Civil Service (Medical): No    Lack of Transportation (Non-Medical): No  Physical Activity: Not on file  Stress: Not on file  Social Connections: Not on file  Intimate Partner Violence: Not At Risk (05/10/2022)   Humiliation, Afraid, Rape, and Kick questionnaire    Fear of Current or Ex-Partner: No    Emotionally Abused: No    Physically Abused: No    Sexually Abused: No    FAMILY HISTORY: Family History  Problem Relation Age of Onset   Breast cancer Mother        dx > 50   Heart attack Mother    Diabetes Father  Breast cancer Maternal Aunt        dx > 50   Breast cancer Maternal Aunt        dx > 50   Breast cancer Maternal Grandmother        ? < 50   Stroke Paternal Grandmother    Alcoholism Other     ALLERGIES:  is allergic to compazine [prochlorperazine edisylate], propofol, oxycodone-acetaminophen, prochlorperazine, and labetalol.  MEDICATIONS:  Current Outpatient Medications  Medication Sig Dispense Refill   Cyanocobalamin (VITAMIN B-12 CR PO) Take by mouth.     diphenoxylate-atropine (LOMOTIL) 2.5-0.025 MG tablet Take 1 tablet by mouth 4 (four) times daily as needed for diarrhea or loose stools. 30 tablet 0   eletriptan (RELPAX) 40 MG tablet Take 1 tablet (40 mg total) by mouth as needed for migraine or headache. May repeat in 2 hours if needed. 15 tablet 6   Folic Acid (FOLATE PO) Place 1 mg under the tongue daily.     lidocaine-prilocaine (EMLA) cream Apply 1 Application topically as needed. 30 g 2   LORazepam (ATIVAN) 0.5 MG tablet Take 0.5 mg by mouth as needed for anxiety.     magic mouthwash (multi-ingredient) oral suspension Swish  and swallow 5 mLs by mouth 4 times daily as needed for throat discomfort 400 mL 1   magnesium oxide (MAG-OX) 400 (241.3 MG) MG tablet TAKE 1 TABLET BY MOUTH TWICE A DAY 60 tablet 6   morphine (MS CONTIN) 15 MG 12 hr tablet Take 1 tablet (15 mg total) by mouth every 8 (eight) hours. Take in addition to MS Contin 30 mg to total 45mg  every 8 hours. 90 tablet 0   morphine (MS CONTIN) 30 MG 12 hr tablet Take 1 tablet (30 mg total) by mouth every 8 (eight) hours. 90 tablet 0   morphine (MSIR) 15 MG tablet Take 1 tablet (15 mg total) by mouth every 4 (four) hours as needed for severe pain (pain score 7-10) or moderate pain (pain score 4-6). 60 tablet 0   OLANZapine (ZYPREXA) 10 MG tablet TAKE 1 TABLET BY MOUTH EVERYDAY AT BEDTIME 90 tablet 1   ondansetron (ZOFRAN) 8 MG tablet Take 1 tablet (8 mg total) by mouth every 8 (eight) hours as needed for nausea or vomiting. 90 tablet 0   ondansetron (ZOFRAN-ODT) 8 MG disintegrating tablet Take 1 tablet (8 mg total) by mouth every 8 (eight) hours as needed for nausea or vomiting. 20 tablet 0   promethazine (PHENERGAN) 25 MG tablet Take 1 tablet (25 mg total) by mouth every 6 (six) hours as needed for nausea or vomiting. 30 tablet 0   scopolamine (TRANSDERM-SCOP) 1 MG/3DAYS Place 1 patch (1.5 mg total) onto the skin every 3 (three) days. 10 patch 12   No current facility-administered medications for this visit.    REVIEW OF SYSTEMS:   Constitutional: ( - ) fevers, ( - )  chills , ( - ) night sweats Eyes: ( - ) blurriness of vision, ( - ) double vision, ( - ) watery eyes Ears, nose, mouth, throat, and face: ( - ) mucositis, ( - ) sore throat Respiratory: ( - ) cough, ( - ) dyspnea, ( - ) wheezes Cardiovascular: ( - ) palpitation, ( - ) chest discomfort, ( - ) lower extremity swelling Gastrointestinal:  (-) nausea, ( - ) heartburn, ( -) change in bowel habits Skin: ( - ) abnormal skin rashes Lymphatics: ( - ) new lymphadenopathy, ( + ) easy  bruising Neurological: ( - )  numbness, ( - ) tingling, ( - ) new weaknesses Behavioral/Psych: ( - ) mood change, ( - ) new changes  All other systems were reviewed with the patient and are negative.  PHYSICAL EXAMINATION: ECOG PERFORMANCE STATUS: 1 - Symptomatic but completely ambulatory  There were no vitals filed for this visit.   There were no vitals filed for this visit.    GENERAL: well appearing female in NAD  SKIN: skin color, texture, turgor are normal, no rashes or significant lesions.  EYES: conjunctiva are pink and non-injected, sclera clear LUNGS: clear to auscultation and percussion with normal breathing effort HEART: regular rate & rhythm and no murmurs and no lower extremity edema Musculoskeletal: no cyanosis of digits and no clubbing  PSYCH: alert & oriented x 3, fluent speech NEURO: no focal motor/sensory deficits  LABORATORY DATA:  I have reviewed the data as listed    Latest Ref Rng & Units 03/23/2023    8:46 AM 03/10/2023    8:21 AM 03/03/2023    8:24 AM  CBC  WBC 4.0 - 10.5 K/uL 4.8  2.2  4.1   Hemoglobin 12.0 - 15.0 g/dL 9.2  8.4  8.6   Hematocrit 36.0 - 46.0 % 29.1  26.5  26.8   Platelets 150 - 400 K/uL 170  97  134        Latest Ref Rng & Units 03/23/2023    8:46 AM 03/10/2023    8:21 AM 03/03/2023    8:24 AM  CMP  Glucose 70 - 99 mg/dL 161  096  045   BUN 6 - 20 mg/dL 10  12  10    Creatinine 0.44 - 1.00 mg/dL 4.09  8.11  9.14   Sodium 135 - 145 mmol/L 138  141  141   Potassium 3.5 - 5.1 mmol/L 3.6  3.6  3.9   Chloride 98 - 111 mmol/L 105  108  107   CO2 22 - 32 mmol/L 29  29  28    Calcium 8.9 - 10.3 mg/dL 9.0  9.0  9.2   Total Protein 6.5 - 8.1 g/dL 7.1  7.2  6.7   Total Bilirubin 0.0 - 1.2 mg/dL 0.3  0.3  0.4   Alkaline Phos 38 - 126 U/L 90  106  90   AST 15 - 41 U/L 18  28  17    ALT 0 - 44 U/L 12  21  12       RADIOGRAPHIC STUDIES: I have personally reviewed the radiological images as listed and agreed with the findings in the  report. No results found.   ASSESSMENT & PLAN Kaitlin Ferrell is a 58 y.o. female who returns to the clinic for newly diagnosed pancreatic adenocarcinoma.    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 Dr. Leonides Schanz recommends chemotherapy regimen with FOLFIRINOX q 2 weeks.  --Plan to obtain restaging CT scans every 3 months to assess treatment response.  --[facility] Testing: MS-stable, 2 Muts/Mb, [id number]*31, KRASG12L, MDM2 amplification, CDKN2A/B. No targetable mutations.  --Started mFOLFIRINOX on 03/30/2022 --After Cycle 3 on 05/04/2022, patient was on a treatment break due to poor tolerance and patient's request. --Most recent CT CAP from 06/30/2022 showed treatment response. --Patient resumed FOLFIRINOX without dose modification on 07/21/2022  --progression noted on CT scan from 10/20/2022, d/c FOLFIRINOX.  PLAN: --Due for  Cycle 5, Day 1 Gem/Abraxane today.  --Labs from today reviewed with patient.  White blood cell 4.8, hemoglobin 9.2, MCV 89.8, platelets 170. Creatinine and LFTs in range. CA19-9 marker 737 on 03/03/2023, down from 1987 earlier that month.  -- dropped dose of Abraxane down to 100 mg/m and Gemcitabine down to 800 mg/m2, no further modifications at this time.  -- Return to clinic prior to Cycle 5 Day 15 Gem/Abraxane chemotherapy, we will obtain repeat CT imaging before then.    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  --Currently on Eliquis 5 mg BID  #Neutropenia --Neutropenic  precautions given to patient including monitoring for fevers.   #Elevated LFTs-normalized --Most likely secondary to chemotherapy. LFTs today within normal limits. --Monitor for now  #Nausea/Vomiting: --Secondary to chemotherapy and well controlled.  -- Continue to take scopolamine patch, phenergan 25 mg q 6 hours, zofran 8mg  q8H PRN for nausea, zyprexa nightly PRN for nausea.   #Epigastric/RUQ/back pain: --Likely secondary to underlying malignancy --Tramadol was ineffective.  --Current pain regimen includes MS contin to 30 mg q 8 hours and Norco 10-325 mg.   #Appetite loss/weight loss: --Weight has improved since last week from 118 to 122 lbs today.  --Prescribed Marinol but is not currently taking it. --Encouraged to eat small, frequent meals and supplement with protein shakes --Follows with [facility] nutrition team  #Family history of breast cancer: --Due to strong family history of cancer and has undergone genetic testing in the past.  --Underwent genetic testing on 04/07/2022 that was negative. 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   #Supportive Care -- chemotherapy education to be scheduled  -- port placement complete  -- EMLA cream for port  No orders of the defined types were placed in this encounter.  All questions were answered. The patient knows to call the clinic with any problems, questions or concerns.  I have spent a total of 30 minutes minutes of face-to-face and non-face-to-face time, preparing to see the patient,  performing a medically appropriate examination, counseling and educating the patient, documenting clinical information in the electronic health record,  and care coordination.   Ulysees Barns, MD Department of Hematology/Oncology [facility] Phone: [phone] Pager: [phone] Email: [email or web address]

## 2023-03-23 NOTE — Progress Notes (Signed)
Nutrition Follow-up:  Patient with pancreatic carcinoma metastatic to liver. She is currently receiving gemcitabine/abraxane q28d.   Met with patient in infusion. Husband is present at visit. She reports pushing nutrition s/p last follow-up. Patient does not get hungry and has been more intentional with po. She has been drinking protein drink (CIB with [employer]). Patient does not do this everyday. She has intermittent "twinges" of nausea without vomiting. Patient is taking antiemetics as prescribed. Reports recent increase in pain medication which has worked well. Patient is on bowel regimen for narcotic induced constipation. Having daily bowel movements at this time.   Medications: reviewed   Labs: reviewed   Anthropometrics: Wt 119 lb 8 oz today - increased   1/31 - 116 lb 12.8 oz 1/24 - 122 lb 11.2 oz 1/17 - 119 lb 12 oz 1/10 - 118 lb 11.2 oz  12/27 - 121 lb 11.2 oz    NUTRITION DIAGNOSIS: Unintended wt loss - improving    INTERVENTION:  Continue strategies for increasing calories and protein with intentional po Continue CIB mixed with whole milk, encouraged daily    MONITORING, EVALUATION, GOAL: wt trends, intake   NEXT VISIT: To be scheduled in collaboration with upcoming [facility] appointments

## 2023-03-25 ENCOUNTER — Other Ambulatory Visit: Payer: Self-pay

## 2023-03-25 ENCOUNTER — Encounter (HOSPITAL_COMMUNITY): Payer: Self-pay | Admitting: Internal Medicine

## 2023-03-25 ENCOUNTER — Observation Stay (HOSPITAL_COMMUNITY)
Admission: EM | Admit: 2023-03-25 | Discharge: 2023-03-26 | Disposition: A | Discharge status: Home / Self Care | Payer: BC Managed Care – PPO | Attending: Family Medicine | Admitting: Family Medicine

## 2023-03-25 DIAGNOSIS — E876 Hypokalemia: Secondary | ICD-10-CM | POA: Diagnosis not present

## 2023-03-25 DIAGNOSIS — G893 Neoplasm related pain (acute) (chronic): Secondary | ICD-10-CM | POA: Diagnosis not present

## 2023-03-25 DIAGNOSIS — R112 Nausea with vomiting, unspecified: Principal | ICD-10-CM | POA: Diagnosis present

## 2023-03-25 DIAGNOSIS — Z79899 Other long term (current) drug therapy: Secondary | ICD-10-CM | POA: Diagnosis not present

## 2023-03-25 DIAGNOSIS — D500 Iron deficiency anemia secondary to blood loss (chronic): Secondary | ICD-10-CM | POA: Diagnosis not present

## 2023-03-25 DIAGNOSIS — C7889 Secondary malignant neoplasm of other digestive organs: Secondary | ICD-10-CM | POA: Diagnosis not present

## 2023-03-25 DIAGNOSIS — I10 Essential (primary) hypertension: Secondary | ICD-10-CM | POA: Insufficient documentation

## 2023-03-25 DIAGNOSIS — Z1152 Encounter for screening for COVID-19: Secondary | ICD-10-CM | POA: Diagnosis not present

## 2023-03-25 LAB — DIFFERENTIAL
Abs Immature Granulocytes: 0.02 10*3/uL (ref 0.00–0.07)
Basophils Absolute: 0 10*3/uL (ref 0.0–0.1)
Basophils Relative: 0 %
Eosinophils Absolute: 0 10*3/uL (ref 0.0–0.5)
Eosinophils Relative: 0 %
Immature Granulocytes: 0 %
Lymphocytes Relative: 4 %
Lymphs Abs: 0.2 10*3/uL — ABNORMAL LOW (ref 0.7–4.0)
Monocytes Absolute: 0.1 10*3/uL (ref 0.1–1.0)
Monocytes Relative: 2 %
Neutro Abs: 4.9 10*3/uL (ref 1.7–7.7)
Neutrophils Relative %: 94 %

## 2023-03-25 LAB — URINALYSIS, ROUTINE W REFLEX MICROSCOPIC
Bacteria, UA: NONE SEEN
Bilirubin Urine: NEGATIVE
Glucose, UA: NEGATIVE mg/dL
Ketones, ur: 20 mg/dL — AB
Leukocytes,Ua: NEGATIVE
Nitrite: NEGATIVE
Protein, ur: NEGATIVE mg/dL
Specific Gravity, Urine: 1.008 (ref 1.005–1.030)
pH: 6 (ref 5.0–8.0)

## 2023-03-25 LAB — CBC
HCT: 31.1 % — ABNORMAL LOW (ref 36.0–46.0)
Hemoglobin: 9.4 g/dL — ABNORMAL LOW (ref 12.0–15.0)
MCH: 27.8 pg (ref 26.0–34.0)
MCHC: 30.2 g/dL (ref 30.0–36.0)
MCV: 92 fL (ref 80.0–100.0)
Platelets: 158 10*3/uL (ref 150–400)
RBC: 3.38 MIL/uL — ABNORMAL LOW (ref 3.87–5.11)
RDW: 16.2 % — ABNORMAL HIGH (ref 11.5–15.5)
WBC: 5.2 10*3/uL (ref 4.0–10.5)
nRBC: 0 % (ref 0.0–0.2)

## 2023-03-25 LAB — COMPREHENSIVE METABOLIC PANEL
ALT: 15 U/L (ref 0–44)
AST: 22 U/L (ref 15–41)
Albumin: 3.6 g/dL (ref 3.5–5.0)
Alkaline Phosphatase: 85 U/L (ref 38–126)
Anion gap: 10 (ref 5–15)
BUN: 8 mg/dL (ref 6–20)
CO2: 22 mmol/L (ref 22–32)
Calcium: 9.1 mg/dL (ref 8.9–10.3)
Chloride: 106 mmol/L (ref 98–111)
Creatinine, Ser: 0.52 mg/dL (ref 0.44–1.00)
GFR, Estimated: >60 mL/min (ref >60)
Glucose, Bld: 119 mg/dL — ABNORMAL HIGH (ref 70–99)
Potassium: 3.6 mmol/L (ref 3.5–5.1)
Sodium: 138 mmol/L (ref 135–145)
Total Bilirubin: 1 mg/dL (ref 0.0–1.2)
Total Protein: 7.4 g/dL (ref 6.5–8.1)

## 2023-03-25 LAB — RESP PANEL BY RT-PCR (RSV, FLU A&B, COVID)  RVPGX2
Influenza A by PCR: NEGATIVE
Influenza B by PCR: NEGATIVE
Resp Syncytial Virus by PCR: NEGATIVE
SARS Coronavirus 2 by RT PCR: NEGATIVE

## 2023-03-25 LAB — LIPASE, BLOOD: Lipase: 21 U/L (ref 11–51)

## 2023-03-25 LAB — MAGNESIUM: Magnesium: 2.3 mg/dL (ref 1.7–2.4)

## 2023-03-25 MED ORDER — MORPHINE SULFATE ER 15 MG PO TBCR
45.0000 mg | EXTENDED_RELEASE_TABLET | Freq: Three times a day (TID) | ORAL | Status: DC
  Administered 2023-03-25 – 2023-03-26 (×3): 45 mg via ORAL
  Filled 2023-03-25 (×3): qty 3

## 2023-03-25 MED ORDER — ALBUTEROL SULFATE (2.5 MG/3ML) 0.083% IN NEBU: 2.5000 mg | INHALATION_SOLUTION | RESPIRATORY_TRACT | Status: DC | PRN

## 2023-03-25 MED ORDER — METOCLOPRAMIDE HCL 5 MG/ML IJ SOLN
10.0000 mg | Freq: Once | INTRAMUSCULAR | Status: AC
  Administered 2023-03-25: 10 mg via INTRAVENOUS
  Filled 2023-03-25: qty 2

## 2023-03-25 MED ORDER — ONDANSETRON HCL 4 MG/2ML IJ SOLN: 4.0000 mg | Freq: Four times a day (QID) | INTRAMUSCULAR | Status: DC | PRN

## 2023-03-25 MED ORDER — MORPHINE SULFATE 15 MG PO TABS: 15.0000 mg | ORAL_TABLET | ORAL | Status: DC | PRN

## 2023-03-25 MED ORDER — HYDROMORPHONE HCL 1 MG/ML IJ SOLN
0.5000 mg | INTRAMUSCULAR | Status: DC | PRN
  Administered 2023-03-25: 1 mg via INTRAVENOUS
  Filled 2023-03-25: qty 1

## 2023-03-25 MED ORDER — OLANZAPINE 5 MG PO TABS
10.0000 mg | ORAL_TABLET | Freq: Every day | ORAL | Status: DC
  Administered 2023-03-25: 10 mg via ORAL
  Filled 2023-03-25: qty 2

## 2023-03-25 MED ORDER — ONDANSETRON HCL 4 MG PO TABS
4.0000 mg | ORAL_TABLET | Freq: Four times a day (QID) | ORAL | Status: DC | PRN
Start: 2023-03-25 — End: 2023-03-26

## 2023-03-25 MED ORDER — SODIUM CHLORIDE 0.9 % IV SOLN
12.5000 mg | Freq: Once | INTRAVENOUS | Status: AC
  Administered 2023-03-25: 12.5 mg via INTRAVENOUS
  Filled 2023-03-25: qty 12.5

## 2023-03-25 MED ORDER — ONDANSETRON HCL 4 MG/2ML IJ SOLN
4.0000 mg | Freq: Once | INTRAMUSCULAR | Status: AC
  Administered 2023-03-25: 4 mg via INTRAVENOUS
  Filled 2023-03-25: qty 2

## 2023-03-25 MED ORDER — DIPHENOXYLATE-ATROPINE 2.5-0.025 MG PO TABS: 1.0000 | ORAL_TABLET | Freq: Four times a day (QID) | ORAL | Status: DC | PRN

## 2023-03-25 MED ORDER — ENOXAPARIN SODIUM 40 MG/0.4ML IJ SOSY
40.0000 mg | PREFILLED_SYRINGE | INTRAMUSCULAR | Status: DC
  Administered 2023-03-25: 40 mg via SUBCUTANEOUS
  Filled 2023-03-25: qty 0.4

## 2023-03-25 MED ORDER — SCOPOLAMINE 1 MG/3DAYS TD PT72: 1.0000 | MEDICATED_PATCH | TRANSDERMAL | Status: DC

## 2023-03-25 MED ORDER — SODIUM CHLORIDE 0.9 % IV SOLN: INTRAVENOUS | Status: AC

## 2023-03-25 MED ORDER — LACTATED RINGERS IV BOLUS
1000.0000 mL | Freq: Once | INTRAVENOUS | Status: AC
  Administered 2023-03-25: 1000 mL via INTRAVENOUS

## 2023-03-25 NOTE — ED Provider Notes (Signed)
[place] EMERGENCY DEPARTMENT AT [facility] Provider Note   CSN: [id number] Arrival date & time: 03/25/23  4098     History  Chief Complaint  Patient presents with   Emesis    Myonna Chisom is a 58 y.o. female.   Emesis   58 year old female presents emergency department with complaints of nausea, vomiting, generalized weakness.  Patient reports receiving chemotherapy for her pancreatic cancer yesterday and developed symptoms late last night into this morning.  Reports 9+ episodes of emesis since symptom onset.  Nonbloody or known coffee-ground emesis described.  States she had at home Phenergan and took the medicine without significant improvement prompting visit to the emergency department.  States that she feels dehydrated.  States she has baseline abdominal pain in her upper middle abdomen which she states is actually better from her baseline pain.  Denies fever, chest pain, shortness of breath, urinary symptoms, change in bowel habits.  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 Authorizing Provider  Cyanocobalamin (VITAMIN B-12 CR PO) Take by mouth.    [provider]  diphenoxylate-atropine (LOMOTIL) 2.5-0.025 MG tablet Take 1 tablet by mouth 4 (four) times daily as needed for diarrhea or loose stools. 04/11/22   Briant Cedar, PA-C  eletriptan (RELPAX) 40 MG tablet Take 1 tablet (40 mg total) by mouth as needed for migraine or headache. May repeat in 2 hours if needed. 09/22/14   Nilda Riggs, NP  Folic Acid (FOLATE PO) Place 1 mg under the tongue daily.    [provider]  lidocaine-prilocaine (EMLA) cream Apply 1 Application topically as needed. 09/29/22   Pickenpack-Cousar, Arty Baumgartner, NP  LORazepam (ATIVAN) 0.5 MG tablet Take 0.5 mg  by mouth as needed for anxiety. 03/22/22   [provider]  magic mouthwash (multi-ingredient) oral suspension Swish and swallow 5 mLs by mouth 4 times daily as needed for throat discomfort 04/26/22   Georga Kaufmann T, PA-C  magnesium oxide (MAG-OX) 400 (241.3 MG) MG tablet TAKE 1 TABLET BY MOUTH TWICE A DAY 11/16/14   Nilda Riggs, NP  morphine (MS CONTIN) 15 MG 12 hr tablet Take 1 tablet (15 mg total) by mouth every 8 (eight) hours. Take in addition to MS Contin 30 mg to total 45mg  every 8 hours. 03/23/23   Pickenpack-Cousar, Arty Baumgartner, NP  morphine (MS CONTIN) 30 MG 12 hr tablet Take 1 tablet (30 mg total) by mouth every 8 (eight) hours. 03/23/23   Pickenpack-Cousar, Arty Baumgartner, NP  morphine (MSIR) 15 MG tablet Take 1 tablet (15 mg total) by mouth every 4 (four) hours as needed for severe pain (pain score 7-10) or moderate pain (pain score 4-6). 03/23/23   Pickenpack-Cousar, Arty Baumgartner, NP  OLANZapine (ZYPREXA) 10 MG tablet TAKE 1 TABLET BY MOUTH EVERYDAY AT BEDTIME 04/20/22   Georga Kaufmann T, PA-C  ondansetron (ZOFRAN) 8 MG tablet Take 1 tablet (8 mg total) by mouth every 8 (eight) hours as needed for nausea or vomiting. 03/14/22   Georga Kaufmann T, PA-C  ondansetron (ZOFRAN-ODT) 8 MG disintegrating tablet Take 1 tablet (8 mg total) by mouth every 8 (eight) hours as needed for nausea or vomiting. 05/17/22   Walisiewicz, Caroleen Hamman, PA-C  promethazine (PHENERGAN) 25 MG tablet Take 1 tablet (25 mg total) by mouth every 6 (six) hours as needed for nausea  or vomiting. 05/04/22   Jaci Standard, MD  scopolamine (TRANSDERM-SCOP) 1 MG/3DAYS Place 1 patch (1.5 mg total) onto the skin every 3 (three) days. 07/22/22   Briant Cedar, PA-C      Allergies    Compazine [prochlorperazine edisylate], Propofol, Oxycodone-acetaminophen, Prochlorperazine, and Labetalol    Review of Systems   Review of Systems  Gastrointestinal:  Positive for vomiting.  All other systems reviewed and are negative.   Physical  Exam Updated Vital Signs BP (!) 164/81   Pulse (!) 101   Temp 99.3 F (37.4 C) (Oral)   Resp 16   Ht 5\' 2"  (1.575 m)   Wt 54 kg   LMP 06/26/2013 Comment: low dose BCP  SpO2 100%   BMI 21.77 kg/m  Physical Exam Vitals and nursing note reviewed.  Constitutional:      General: She is not in acute distress.    Appearance: She is well-developed.  HENT:     Head: Normocephalic and atraumatic.  Eyes:     Conjunctiva/sclera: Conjunctivae normal.  Cardiovascular:     Rate and Rhythm: Normal rate and regular rhythm.     Heart sounds: No murmur heard. Pulmonary:     Effort: Pulmonary effort is normal. No respiratory distress.     Breath sounds: Normal breath sounds.  Abdominal:     Palpations: Abdomen is soft.     Tenderness: There is abdominal tenderness in the right upper quadrant and epigastric area.  Musculoskeletal:        General: No swelling.     Cervical back: Neck supple.  Skin:    General: Skin is warm and dry.     Capillary Refill: Capillary refill takes less than 2 seconds.  Neurological:     Mental Status: She is alert.  Psychiatric:        Mood and Affect: Mood normal.     ED Results / Procedures / Treatments   Labs (all labs ordered are listed, but only abnormal results are displayed) Labs Reviewed  COMPREHENSIVE METABOLIC PANEL - Abnormal; Notable for the following components:      Result Value   Glucose, Bld 119 (*)    All other components within normal limits  CBC - Abnormal; Notable for the following components:   RBC 3.38 (*)    Hemoglobin 9.4 (*)    HCT 31.1 (*)    RDW 16.2 (*)    All other components within normal limits  DIFFERENTIAL - Abnormal; Notable for the following components:   Lymphs Abs 0.2 (*)    All other components within normal limits  RESP PANEL BY RT-PCR (RSV, FLU A&B, COVID)  RVPGX2  MAGNESIUM  LIPASE, BLOOD  URINALYSIS, ROUTINE W REFLEX MICROSCOPIC    EKG None  Radiology No results found.  Procedures Procedures     Medications Ordered in ED Medications  morphine (MS CONTIN) 12 hr tablet 45 mg (has no administration in time range)  morphine (MSIR) tablet 15 mg (has no administration in time range)  OLANZapine (ZYPREXA) tablet 10 mg (has no administration in time range)  diphenoxylate-atropine (LOMOTIL) 2.5-0.025 MG per tablet 1 tablet (has no administration in time range)  scopolamine (TRANSDERM-SCOP) 1 MG/3DAYS 1.5 mg (has no administration in time range)  enoxaparin (LOVENOX) injection 40 mg (has no administration in time range)  0.9 %  sodium chloride infusion (has no administration in time range)  HYDROmorphone (DILAUDID) injection 0.5-1 mg (has no administration in time range)  ondansetron (ZOFRAN) tablet 4 mg (  has no administration in time range)    Or  ondansetron (ZOFRAN) injection 4 mg (has no administration in time range)  albuterol (PROVENTIL) (2.5 MG/3ML) 0.083% nebulizer solution 2.5 mg (has no administration in time range)  lactated ringers bolus 1,000 mL (0 mLs Intravenous Stopped 03/25/23 1230)  promethazine (PHENERGAN) 12.5 mg in sodium chloride 0.9 % 50 mL IVPB (0 mg Intravenous Stopped 03/25/23 1142)  ondansetron (ZOFRAN) injection 4 mg (4 mg Intravenous Given 03/25/23 1200)  metoCLOPramide (REGLAN) injection 10 mg (10 mg Intravenous Given 03/25/23 1331)    ED Course/ Medical Decision Making/ A&P Clinical Course as of 03/25/23 1444  Sat Mar 25, 2023  1230 Patient with continued vomiting x 2 after antiemetic.  Will administer third and admit to hospitalist. [CR]  1243 Consulted oncology Dr. Arlana Pouch regarding the patient who will see patient in rounding.  Will admit to hospitalist. [CR]    Clinical Course User Index [CR] Peter Garter, PA                                 Medical Decision Making Amount and/or Complexity of Data Reviewed Labs: ordered.  Risk Prescription drug management. Decision regarding hospitalization.   This patient presents to the ED for concern of  nausea, vomiting, this involves an extensive number of treatment options, and is a complaint that carries with it a high risk of complications and morbidity.  The differential diagnosis includes medication side effect, pancreatitis, CBD pathology, SBO/LBO, volvulus, viral illness, other   Co morbidities that complicate the patient evaluation  See HPI   Additional history obtained:  Additional history obtained from EMR External records from outside source obtained and reviewed including hospital records   Lab Tests:  I Ordered, and personally interpreted labs.  The pertinent results include: No leukocytosis.  Baseline anemia hemoglobin 9.4.  Platelets within range.  No Electra abnormalities.  No transaminitis.  No renal dysfunction.  Viral testing negative.  Lipase within normal limits.   Imaging Studies ordered:  N/a   Cardiac Monitoring: / EKG:  The patient was maintained on a cardiac monitor.  I personally viewed and interpreted the cardiac monitored which showed an underlying rhythm of: sinus rhythm   Consultations Obtained:  See ED course  Problem List / ED Course / Critical interventions / Medication management  Nausea and vomiting I ordered medication including promethazine, Zofran, Reglan   Reevaluation of the patient after these medicines showed that the patient improved I have reviewed the patients home medicines and have made adjustments as needed   Social Determinants of Health:  Denies tobacco, licit drug use   Test / Admission - Considered:  Nausea and vomiting Vitals signs significant for hypertension blood pressure 164. Otherwise within normal range and stable throughout visit. Laboratory studies significant for: See above 58 year old female presents emergency department with complaints of nausea, vomiting.  Patient states that she had chemotherapy infusion yesterday and developed symptoms late last night into this morning prompting visit to the  emergency department.  Symptoms refractory to her at home antiemetics.  On exam, tenderness in epigastrium.  Patient reports baseline pain in epigastric region and states that her pain is currently significantly better even from her baseline pain.  Given no acute change in patient's baseline, CT imaging was foregone.  Laboratory studies obtained were reassuring.  Patient trialed with antiemetic x 3 with failure of p.o. challenge x 3.  Suspect the patient's symptoms  likely secondary to recent infusion.  Given patient's failure of p.o. challenge x 3, pursued admission.  Consulted oncology who agreed to round on patient.  Consulted hospitalist who agreed with admission.  Treatment plan discussed at length with patient and she acknowledged understanding was agreeable to said plan.  Patient stable upon admission.        Final Clinical Impression(s) / ED Diagnoses Final diagnoses:  Nausea and vomiting, unspecified vomiting type    Rx / DC Orders ED Discharge Orders     None         Peter Garter, [place] 03/25/23 1444    Gloris Manchester, MD 03/25/23 1537

## 2023-03-25 NOTE — Plan of Care (Signed)
Patient is alert and oriented X4, admission questions completed, patient walking independently from stretcher to bed. Patient has no complains of nausea or pain atm and husband at the bedside. RN educated husband and patient on only taking medications given at the hospital and to not bring any medications from home. VSS, Chest port accessed in ED with NS running continuously. Patient resting in bed comfortably, no further needs at this time.   Problem: Education: Goal: Knowledge of General Education information will improve Description: Including pain rating scale, medication(s)/side effects and non-pharmacologic comfort measures Outcome: Progressing   Problem: Health Behavior/Discharge Planning: Goal: Ability to manage health-related needs will improve Outcome: Progressing   Problem: Clinical Measurements: Goal: Ability to maintain clinical measurements within normal limits will improve Outcome: Progressing Goal: Will remain free from infection Outcome: Progressing Goal: Diagnostic test results will improve Outcome: Progressing Goal: Respiratory complications will improve Outcome: Progressing Goal: Cardiovascular complication will be avoided Outcome: Progressing   Problem: Activity: Goal: Risk for activity intolerance will decrease Outcome: Progressing   Problem: Nutrition: Goal: Adequate nutrition will be maintained Outcome: Progressing   Problem: Coping: Goal: Level of anxiety will decrease Outcome: Progressing   Problem: Elimination: Goal: Will not experience complications related to bowel motility Outcome: Progressing Goal: Will not experience complications related to urinary retention Outcome: Progressing   Problem: Pain Managment: Goal: General experience of comfort will improve and/or be controlled Outcome: Progressing   Problem: Safety: Goal: Ability to remain free from injury will improve Outcome: Progressing   Problem: Skin Integrity: Goal: Risk for  impaired skin integrity will decrease Outcome: Progressing

## 2023-03-25 NOTE — ED Triage Notes (Signed)
Patient reports doing chemo yesterday and vomiting 9 times since last night Pain rated 9/10

## 2023-03-25 NOTE — H&P (Signed)
History and Physical  Kaitlin Ferrell ZOX:[id number] DOB: 1966/02/07 DOA: 03/25/2023  PCP: Bernadette Hoit, MD   Chief Complaint: Nausea/vomiting  HPI: Kaitlin Ferrell is a 58 y.o. female with medical history significant for metastatic pancreatic cancer being admitted to the hospital with chemotherapy related intractable vomiting.  Patient states she had chemotherapy yesterday, though notes indicate she received treatment 2/13.  Regardless, she tolerated treatment well, but started having severe intractable nausea and vomiting overnight.  Vomited about 9 times, unable to keep anything down.  Denies any fevers, increased abdominal pain, or diarrhea.  Evaluation in the emergency department as detailed below does not show any acute or worrisome abnormalities.  However, she has failed multiple attempts at p.o. challenge while in the ER and so hospitalist was contacted for observation admission.  Review of Systems: Please see HPI for pertinent positives and negatives. A complete 10 system review of systems are otherwise negative.  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   treated with 4 cycles of actinomycin D from 12-02-11 thru 01-13-12   H/O molar pregnancy, antepartum    Hypertension    Migraine    Miscarriage    Past Surgical History:  Procedure Laterality Date   BREAST BIOPSY  2001   Left   BREAST LUMPECTOMY     DILATION AND CURETTAGE OF UTERUS  1992, 09/2011   DILATION AND EVACUATION  2011   INSERTION OF MESH N/A 09/03/2013   Procedure: INSERTION OF MESH;  Surgeon: Ardeth Sportsman, MD;  Location: [facility];  Service: General;  Laterality: N/A;   IR CHEST FLUORO  04/14/2022   IR IMAGING GUIDED PORT INSERTION  03/22/2022   IR IMAGING GUIDED PORT INSERTION  05/02/2022   IR PORT REPAIR CENTRAL  VENOUS ACCESS DEVICE  04/19/2022   IR REMOVAL TUN CV CATH W/O FL  05/02/2022   IR US LIVER BIOPSY  03/22/2022   PORTACATH PLACEMENT  11/2011   PORTACATH PLACEMENT     REMOVAL  MARCH 2014   VENTRAL HERNIA REPAIR N/A 09/03/2013   Procedure: LAPAROSCOPIC VENTRAL WALL HERNIA REPAIR;  Surgeon: Ardeth Sportsman, MD;  Location: [facility];  Service: General;  Laterality: N/A;   WISDOM TOOTH EXTRACTION     in 11th grade   Social History:  reports that she has never smoked. She has never used smokeless tobacco. She reports current alcohol use of about 1.0 standard drink of alcohol per week. She reports that she does not use drugs.  Allergies  Allergen Reactions   Compazine [Prochlorperazine Edisylate]     Very agitated   Propofol Other (See Comments)    Headache   Oxycodone-Acetaminophen    Prochlorperazine    Labetalol Hives, Itching, Swelling and Rash    Family History  Problem Relation Age of Onset   Breast cancer Mother        dx > 50   Heart attack Mother    Diabetes Father    Breast cancer Maternal Aunt        dx > 50   Breast cancer Maternal Aunt        dx > 50   Breast cancer Maternal Grandmother        ? < 50   Stroke Paternal Grandmother    Alcoholism Other      Prior to Admission  medications   Medication Sig Start Date End Date Taking? Authorizing Provider  Cyanocobalamin (VITAMIN B-12 CR PO) Take by mouth.    [provider]  diphenoxylate-atropine (LOMOTIL) 2.5-0.025 MG tablet Take 1 tablet by mouth 4 (four) times daily as needed for diarrhea or loose stools. 04/11/22   Briant Cedar, PA-C  eletriptan (RELPAX) 40 MG tablet Take 1 tablet (40 mg total) by mouth as needed for migraine or headache. May repeat in 2 hours if needed. 09/22/14   Nilda Riggs, NP  Folic Acid (FOLATE PO) Place 1 mg under the tongue daily.    [provider]  lidocaine-prilocaine (EMLA) cream Apply 1 Application topically as needed. 09/29/22   Pickenpack-Cousar, Arty Baumgartner, NP   LORazepam (ATIVAN) 0.5 MG tablet Take 0.5 mg by mouth as needed for anxiety. 03/22/22   [provider]  magic mouthwash (multi-ingredient) oral suspension Swish and swallow 5 mLs by mouth 4 times daily as needed for throat discomfort 04/26/22   Georga Kaufmann T, PA-C  magnesium oxide (MAG-OX) 400 (241.3 MG) MG tablet TAKE 1 TABLET BY MOUTH TWICE A DAY 11/16/14   Nilda Riggs, NP  morphine (MS CONTIN) 15 MG 12 hr tablet Take 1 tablet (15 mg total) by mouth every 8 (eight) hours. Take in addition to MS Contin 30 mg to total 45mg  every 8 hours. 03/23/23   Pickenpack-Cousar, Arty Baumgartner, NP  morphine (MS CONTIN) 30 MG 12 hr tablet Take 1 tablet (30 mg total) by mouth every 8 (eight) hours. 03/23/23   Pickenpack-Cousar, Arty Baumgartner, NP  morphine (MSIR) 15 MG tablet Take 1 tablet (15 mg total) by mouth every 4 (four) hours as needed for severe pain (pain score 7-10) or moderate pain (pain score 4-6). 03/23/23   Pickenpack-Cousar, Arty Baumgartner, NP  OLANZapine (ZYPREXA) 10 MG tablet TAKE 1 TABLET BY MOUTH EVERYDAY AT BEDTIME 04/20/22   Georga Kaufmann T, PA-C  ondansetron (ZOFRAN) 8 MG tablet Take 1 tablet (8 mg total) by mouth every 8 (eight) hours as needed for nausea or vomiting. 03/14/22   Georga Kaufmann T, PA-C  ondansetron (ZOFRAN-ODT) 8 MG disintegrating tablet Take 1 tablet (8 mg total) by mouth every 8 (eight) hours as needed for nausea or vomiting. 05/17/22   Walisiewicz, Caroleen Hamman, PA-C  promethazine (PHENERGAN) 25 MG tablet Take 1 tablet (25 mg total) by mouth every 6 (six) hours as needed for nausea or vomiting. 05/04/22   Jaci Standard, MD  scopolamine (TRANSDERM-SCOP) 1 MG/3DAYS Place 1 patch (1.5 mg total) onto the skin every 3 (three) days. 07/22/22   Briant Cedar, PA-C    Physical Exam: BP (!) 164/81   Pulse (!) 101   Temp 99.3 F (37.4 C) (Oral)   Resp 16   Ht 5\' 2"  (1.575 m)   Wt 54 kg   LMP 06/26/2013 Comment: low dose BCP  SpO2 100%   BMI 21.77 kg/m  General:  Alert, oriented,  calm, in no acute distress  Eyes: EOMI, clear conjuctivae, white sclerea Neck: supple, no masses, trachea mildline  Cardiovascular: RRR, no murmurs or rubs, no peripheral edema  Respiratory: clear to auscultation bilaterally, no wheezes, no crackles  Abdomen: soft, nontender, nondistended, normal bowel tones heard  Skin: dry, no rashes  Musculoskeletal: no joint effusions, normal range of motion  Psychiatric: appropriate affect, normal speech  Neurologic: extraocular muscles intact, clear speech, moving all extremities with intact sensorium         Labs on Admission:  Basic Metabolic Panel: Recent Labs  Lab 03/23/23 0846 03/25/23 1127  NA 138 138  K 3.6 3.6  CL 105 106  CO2 29 22  GLUCOSE 116* 119*  BUN 10 8  CREATININE 0.54 0.52  CALCIUM 9.0 9.1  MG  --  2.3   Liver Function Tests: Recent Labs  Lab 03/23/23 0846 03/25/23 1127  AST 18 22  ALT 12 15  ALKPHOS 90 85  BILITOT 0.3 1.0  PROT 7.1 7.4  ALBUMIN 3.6 3.6   Recent Labs  Lab 03/25/23 1127  LIPASE 21   No results for input(s): "AMMONIA" in the last 168 hours. CBC: Recent Labs  Lab 03/23/23 0846 03/25/23 1127  WBC 4.8 5.2  NEUTROABS 3.2 4.9  HGB 9.2* 9.4*  HCT 29.1* 31.1*  MCV 89.8 92.0  PLT 170 158   Cardiac Enzymes: No results for input(s): "CKTOTAL", "CKMB", "CKMBINDEX", "TROPONINI" in the last 168 hours. BNP (last 3 results) No results for input(s): "BNP" in the last 8760 hours.  ProBNP (last 3 results) No results for input(s): "PROBNP" in the last 8760 hours.  CBG: No results for input(s): "GLUCAP" in the last 168 hours.  Radiological Exams on Admission: No results found.  Assessment/Plan Evva Din is a 58 y.o. female with medical history significant for metastatic pancreatic cancer being admitted to the hospital with chemotherapy related intractable vomiting.    Intractable vomiting-due to recent chemotherapy -Observation admission -Gentle IV fluids -Pain and nausea  medication as needed -Continue home scopolamine patch  Chronic cancer related pain-continue home MS Contin 45 mg every 8 hours, with as needed IV Dilaudid in the setting of vomiting  Chronic anemia-stable  Metastatic pancreatic cancer-under the care of Dr. Leonides Schanz, ER provider has discussed with on-call oncologist they will follow peripherally this weekend  DVT prophylaxis: Lovenox     Code Status: Full Code  Consults called: Oncology  Admission status: Observation  Time spent: 45 minutes  Hedda Crumbley Sharlette Dense MD [facility] Pager [phone]  If 7PM-7AM, please contact night-coverage [email or web address] Password [email or web address]  03/25/2023, 2:18 PM

## 2023-03-26 DIAGNOSIS — R112 Nausea with vomiting, unspecified: Secondary | ICD-10-CM | POA: Diagnosis not present

## 2023-03-26 LAB — CBC
HCT: 27 % — ABNORMAL LOW (ref 36.0–46.0)
Hemoglobin: 8.1 g/dL — ABNORMAL LOW (ref 12.0–15.0)
MCH: 28.1 pg (ref 26.0–34.0)
MCHC: 30 g/dL (ref 30.0–36.0)
MCV: 93.8 fL (ref 80.0–100.0)
Platelets: 143 10*3/uL — ABNORMAL LOW (ref 150–400)
RBC: 2.88 MIL/uL — ABNORMAL LOW (ref 3.87–5.11)
RDW: 16.3 % — ABNORMAL HIGH (ref 11.5–15.5)
WBC: 4.3 10*3/uL (ref 4.0–10.5)
nRBC: 0 % (ref 0.0–0.2)

## 2023-03-26 LAB — BASIC METABOLIC PANEL
Anion gap: 6 (ref 5–15)
BUN: 9 mg/dL (ref 6–20)
CO2: 24 mmol/L (ref 22–32)
Calcium: 8 mg/dL — ABNORMAL LOW (ref 8.9–10.3)
Chloride: 110 mmol/L (ref 98–111)
Creatinine, Ser: 0.45 mg/dL (ref 0.44–1.00)
GFR, Estimated: >60 mL/min (ref >60)
Glucose, Bld: 83 mg/dL (ref 70–99)
Potassium: 3.4 mmol/L — ABNORMAL LOW (ref 3.5–5.1)
Sodium: 140 mmol/L (ref 135–145)

## 2023-03-26 MED ORDER — HEPARIN SOD (PORK) LOCK FLUSH 100 UNIT/ML IV SOLN
500.0000 [IU] | Freq: Once | INTRAVENOUS | Status: AC
  Administered 2023-03-26: 500 [IU] via INTRAVENOUS
  Filled 2023-03-26: qty 5

## 2023-03-26 MED ORDER — POTASSIUM CHLORIDE CRYS ER 20 MEQ PO TBCR
40.0000 meq | EXTENDED_RELEASE_TABLET | Freq: Once | ORAL | Status: AC
  Administered 2023-03-26: 40 meq via ORAL
  Filled 2023-03-26: qty 2

## 2023-03-26 MED ORDER — ACETAMINOPHEN 325 MG PO TABS
650.0000 mg | ORAL_TABLET | Freq: Four times a day (QID) | ORAL | Status: DC | PRN
  Administered 2023-03-26: 650 mg via ORAL
  Filled 2023-03-26: qty 2

## 2023-03-26 NOTE — Plan of Care (Signed)
Patient is alert and oriented X4, all discharge paperwork information shared with pt and husband at the bedside, no further needs at this time. Chest port de-accessed and husband to transport patient home.  Problem: Education: Goal: Knowledge of General Education information will improve Description: Including pain rating scale, medication(s)/side effects and non-pharmacologic comfort measures 03/26/2023 1301 by Vic Blackbird, Ronita Hipps, RN Outcome: Completed/Met 03/26/2023 0721 by Vic Blackbird, Ronita Hipps, RN Outcome: Progressing 03/26/2023 0719 by Vic Blackbird, Ronita Hipps, RN Outcome: Progressing   Problem: Health Behavior/Discharge Planning: Goal: Ability to manage health-related needs will improve 03/26/2023 1301 by Vic Blackbird, Ronita Hipps, RN Outcome: Completed/Met 03/26/2023 0721 by Vic Blackbird, Ronita Hipps, RN Outcome: Progressing 03/26/2023 0719 by Floydene Flock, RN Outcome: Progressing   Problem: Clinical Measurements: Goal: Ability to maintain clinical measurements within normal limits will improve 03/26/2023 1301 by Vic Blackbird, Ronita Hipps, RN Outcome: Completed/Met 03/26/2023 0721 by Vic Blackbird, Ronita Hipps, RN Outcome: Progressing 03/26/2023 0719 by Floydene Flock, RN Outcome: Progressing Goal: Will remain free from infection 03/26/2023 1301 by Vic Blackbird, Ronita Hipps, RN Outcome: Completed/Met 03/26/2023 0721 by Vic Blackbird, Ronita Hipps, RN Outcome: Progressing 03/26/2023 0719 by Floydene Flock, RN Outcome: Progressing Goal: Diagnostic test results will improve 03/26/2023 1301 by Vic Blackbird, Ronita Hipps, RN Outcome: Completed/Met 03/26/2023 0721 by Floydene Flock, RN Outcome: Progressing 03/26/2023 0719 by Floydene Flock, RN Outcome: Progressing Goal: Respiratory  complications will improve 03/26/2023 1301 by Floydene Flock, RN Outcome: Completed/Met 03/26/2023 0721 by Floydene Flock, RN Outcome: Progressing 03/26/2023 0719 by Floydene Flock, RN Outcome: Progressing Goal: Cardiovascular complication will be avoided 03/26/2023 1301 by Floydene Flock, RN Outcome: Completed/Met 03/26/2023 0721 by Floydene Flock, RN Outcome: Progressing 03/26/2023 0719 by Floydene Flock, RN Outcome: Progressing   Problem: Activity: Goal: Risk for activity intolerance will decrease 03/26/2023 1301 by Vic Blackbird, Ronita Hipps, RN Outcome: Completed/Met 03/26/2023 0721 by Vic Blackbird, Ronita Hipps, RN Outcome: Progressing 03/26/2023 0719 by Vic Blackbird, Ronita Hipps, RN Outcome: Progressing   Problem: Nutrition: Goal: Adequate nutrition will be maintained 03/26/2023 1301 by Vic Blackbird, Ronita Hipps, RN Outcome: Completed/Met 03/26/2023 0721 by Vic Blackbird, Ronita Hipps, RN Outcome: Progressing 03/26/2023 0719 by Floydene Flock, RN Outcome: Progressing   Problem: Coping: Goal: Level of anxiety will decrease 03/26/2023 1301 by Vic Blackbird, Ronita Hipps, RN Outcome: Completed/Met 03/26/2023 0721 by Vic Blackbird, Ronita Hipps, RN Outcome: Progressing 03/26/2023 0719 by Floydene Flock, RN Outcome: Progressing   Problem: Elimination: Goal: Will not experience complications related to bowel motility 03/26/2023 1301 by Floydene Flock, RN Outcome: Completed/Met 03/26/2023 0721 by Vic Blackbird, Ronita Hipps, RN Outcome: Progressing 03/26/2023 0719 by Floydene Flock, RN Outcome: Progressing Goal: Will not experience complications related to urinary retention 03/26/2023 1301 by Vic Blackbird, Ronita Hipps,  RN Outcome: Completed/Met 03/26/2023 0721 by Vic Blackbird, Ronita Hipps, RN Outcome: Progressing 03/26/2023 0719 by Floydene Flock, RN Outcome: Progressing   Problem: Pain Managment: Goal: General experience of comfort will improve and/or be controlled 03/26/2023 1301 by Floydene Flock, RN Outcome: Completed/Met 03/26/2023 0721 by Floydene Flock, RN Outcome: Progressing 03/26/2023 0719 by Floydene Flock, RN Outcome: Progressing   Problem: Safety: Goal: Ability to remain free from injury will improve 03/26/2023 1301 by Floydene Flock, RN Outcome: Completed/Met 03/26/2023 0721 by Floydene Flock, RN Outcome:  Progressing 03/26/2023 0719 by Vic Blackbird, Ronita Hipps, RN Outcome: Progressing   Problem: Skin Integrity: Goal: Risk for impaired skin integrity will decrease 03/26/2023 1301 by Vic Blackbird, Ronita Hipps, RN Outcome: Completed/Met 03/26/2023 0721 by Vic Blackbird, Ronita Hipps, RN Outcome: Progressing 03/26/2023 0719 by Floydene Flock, RN Outcome: Progressing

## 2023-03-26 NOTE — Progress Notes (Signed)
   03/26/23 1300  TOC Brief Assessment  Insurance and Status Reviewed  Patient has primary care physician Yes  Home environment has been reviewed home  Prior level of function: independent  Prior/Current Home Services No current home services  Social Drivers of Health Review SDOH reviewed no interventions necessary  Readmission risk has been reviewed Yes  Transition of care needs no transition of care needs at this time

## 2023-03-26 NOTE — Plan of Care (Signed)

## 2023-03-26 NOTE — Discharge Summary (Signed)
Physician Discharge Summary   Patient: Kaitlin Ferrell MRN: [id number] DOB: March 11, 1965  Admit date:     03/25/2023  Discharge date: 03/26/23  Discharge Physician: Meredeth Ide   PCP: Bernadette Hoit, MD   Recommendations at discharge:   Follow-up PCP in 1 week  Discharge Diagnoses: Active Problems:   Intractable vomiting with nausea  Resolved Problems:   * No resolved hospital problems. *  Hospital Course:  58 y.o. female with medical history significant for metastatic pancreatic cancer being admitted to the hospital with chemotherapy related intractable vomiting.  Patient states she had chemotherapy yesterday, though notes indicate she received treatment 2/13.  Regardless, she tolerated treatment well, but started having severe intractable nausea and vomiting overnight.  Vomited about 9 times, unable to keep anything down.  Denies any fevers, increased abdominal pain, or diarrhea.  Evaluation in the emergency department as detailed below does not show any acute or worrisome abnormalities.  However, she has failed multiple attempts at p.o. challenge while in the ER and so hospitalist was contacted for observation admission.   Assessment and Plan:  Intractable nausea and vomiting -Secondary to chemotherapy -Resolved, tolerating diet -Continue scopolamine patch -Continue Zofran as needed  Chronic cancer-related pain -Continue MS Contin  Chronic anemia -Came with hemoglobin of 9.4 -Hemoglobin stable at 8.1 -Drop in hemoglobin is likely dilutional  Metastatic pancreatic cancer -Follow-up oncology as outpatient  Hypokalemia -Potassium is 3.4 -Replace potassium before discharge      Consultants:  Procedures performed:  Disposition: Home Diet recommendation:  Discharge Diet Orders (From admission, onward)     Start     Ordered   03/26/23 0000  Diet - low sodium heart healthy        03/26/23 1247           Regular diet DISCHARGE MEDICATION: Allergies  as of 03/26/2023       Reactions   Compazine [prochlorperazine Edisylate]    Very agitated   Propofol Other (See Comments)   Headache   Oxycodone-acetaminophen    Prochlorperazine    Labetalol Hives, Itching, Swelling, Rash        Medication List     TAKE these medications    diphenoxylate-atropine 2.5-0.025 MG tablet Commonly known as: LOMOTIL Take 1 tablet by mouth 4 (four) times daily as needed for diarrhea or loose stools.   eletriptan 40 MG tablet Commonly known as: Relpax Take 1 tablet (40 mg total) by mouth as needed for migraine or headache. May repeat in 2 hours if needed.   FOLATE PO Place 1 mg under the tongue daily.   lidocaine-prilocaine cream Commonly known as: EMLA Apply 1 Application topically as needed.   LORazepam 0.5 MG tablet Commonly known as: ATIVAN Take 0.5 mg by mouth as needed for anxiety.   magic mouthwash (multi-ingredient) oral suspension Swish and swallow 5 mLs by mouth 4 times daily as needed for throat discomfort   magnesium oxide 400 (241.3 Mg) MG tablet Commonly known as: MAG-OX TAKE 1 TABLET BY MOUTH TWICE A DAY   morphine 30 MG 12 hr tablet Commonly known as: MS CONTIN Take 1 tablet (30 mg total) by mouth every 8 (eight) hours.   morphine 15 MG 12 hr tablet Commonly known as: MS CONTIN Take 1 tablet (15 mg total) by mouth every 8 (eight) hours. Take in addition to MS Contin 30 mg to total 45mg  every 8 hours.   morphine 15 MG tablet Commonly known as: MSIR Take 1 tablet (15 mg total)  by mouth every 4 (four) hours as needed for severe pain (pain score 7-10) or moderate pain (pain score 4-6).   OLANZapine 10 MG tablet Commonly known as: ZYPREXA TAKE 1 TABLET BY MOUTH EVERYDAY AT BEDTIME   ondansetron 8 MG disintegrating tablet Commonly known as: ZOFRAN-ODT Take 1 tablet (8 mg total) by mouth every 8 (eight) hours as needed for nausea or vomiting.   ondansetron 8 MG tablet Commonly known as: ZOFRAN Take 1 tablet (8 mg  total) by mouth every 8 (eight) hours as needed for nausea or vomiting.   pantoprazole 40 MG tablet Commonly known as: PROTONIX Take 40 mg by mouth every morning.   promethazine 25 MG tablet Commonly known as: PHENERGAN Take 1 tablet (25 mg total) by mouth every 6 (six) hours as needed for nausea or vomiting.   scopolamine 1 MG/3DAYS Commonly known as: TRANSDERM-SCOP Place 1 patch (1.5 mg total) onto the skin every 3 (three) days.   VITAMIN B-12 CR PO Take by mouth.   Yuvafem 10 MCG Tabs vaginal tablet Generic drug: Estradiol Place 1 tablet vaginally 2 (two) times a week. For 14 days, then insert vaginally twice weekly.        Discharge Exam: Filed Weights   03/25/23 1002  Weight: 54 kg   General-appears in no acute distress Heart-S1-S2, regular, no murmur auscultated Lungs-clear to auscultation bilaterally, no wheezing or crackles auscultated Abdomen-soft, nontender, no organomegaly Extremities-no edema in the lower extremities Neuro-alert, oriented x3, no focal deficit noted  Condition at discharge: good  The results of significant diagnostics from this hospitalization (including imaging, microbiology, ancillary and laboratory) are listed below for reference.   Imaging Studies: CT CHEST ABDOMEN PELVIS W CONTRAST Result Date: 03/23/2023 CLINICAL DATA:  Pancreatic adenocarcinoma restaging. * Tracking Code: BO * EXAM: CT CHEST, ABDOMEN, AND PELVIS WITH CONTRAST TECHNIQUE: Multidetector CT imaging of the chest, abdomen and pelvis was performed following the standard protocol during bolus administration of intravenous contrast. RADIATION DOSE REDUCTION: This exam was performed according to the departmental dose-optimization program which includes automated exposure control, adjustment of the mA and/or kV according to patient size and/or use of iterative reconstruction technique. CONTRAST:  OMNIPAQUE IOHEXOL 300 MG/ML  SOLN COMPARISON:  CT 01/20/2023 and older. FINDINGS:  CT CHEST FINDINGS Cardiovascular: Left upper chest port in place. Tip extending into the right atrium. Heart is nonenlarged. No pericardial effusion. The thoracic aorta is normal course and caliber with slight atherosclerotic plaque. Calcified aorta. Bovine type aortic arch. Mediastinum/Nodes: No specific abnormal lymph node enlargement present in the axillary regions, hilum or mediastinum. Normal caliber thoracic esophagus. Preserved thyroid gland. Lungs/Pleura: Bilateral apical pleural thickening identified. No consolidation, pneumothorax or effusion. Punctate calcifications with reticular changes along the lateral right lower lobe is unchanged from previous and possibly sequela of old infectious or inflammatory process. Musculoskeletal: Mild degenerative changes along the spine. A stable area of sclerosis along the T9 vertebral body. CT ABDOMEN PELVIS FINDINGS Hepatobiliary: Liver metastases are again seen. These are scattered throughout the liver. Specific lesions will be followed for continuity. Example peripheral right lower lobe measuring 15 x 15 mm previously, today on series 2, image 36 measures 2.1 x 2.0 cm. Segment 4A lesion which previously measured 10 x 7 mm, today is similar on series 7, image 80. However lobe lateral to this is a larger lesion which previously measured 15 x 11 mm and today on series 7, image 81 18 x 17 mm. Other lesions are also increased in size. Patent portal  vein. Gallbladder is nondilated. Pancreas: Slight pancreatic duct ectasia. Relatively preserved pancreatic parenchyma. Along the inferior aspect however of the pancreatic head and uncinate process is a locally infiltrative mass. Previously this was measured at 4.1 x 3.0 cm. When remeasured today using the same technique this mass encasing the SMA on series 7, image 105 measures 4.0 x 3.3 cm. Similar. Measurements are difficult due to the nature of the lesion and location. 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 Spleen: Normal in size without focal abnormality. Adrenals/Urinary Tract: Adrenal glands are unremarkable. Kidneys are normal, without renal calculi, focal lesion, or hydronephrosis. Bladder is underdistended but has some wall thickening. Adjacent stranding. Stomach/Bowel: On this non oral contrast exam the stomach is nondilated. Small bowel is nondilated. There are loops of small bowel stool appearance in the pelvis, nonspecific. The cecum resides in the anterior low pelvis just above the bladder. Diffuse colonic stool identified. There is presence of wall thickening with some stranding along the rectosigmoid colon which is new from previous. There is significant perirectal fat and presacral space stranding. Edema tracks along the pelvic sidewalls as well. Edema tracks along the lower abdominal retroperitoneum. Vascular/Lymphatic: Normal caliber aorta and IVC. Once again there are several abnormal nodes previously identified. These include a aortocaval node which previously had a short axis of 7 mm and today on series 7, image 114 measures 9 mm. Left common iliac chain node which previously had short axis of 11 mm, today measures 11 mm on image 135. Left pelvic sidewall node short axis of 11 mm, today measures 15 mm. This is larger but the node is more low in density diffusely. New right-sided pelvic sidewall node measures 15 mm on series 7, image 151. Nodes in the right hemipelvis are also more low in density. Additional retrocaval node on series 2, image 45 measures 10 mm in short axis today. Previously this node measured 4 mm in short axis. Reproductive: Status post hysterectomy. No adnexal masses. Other: Anasarca.  Trace ascites.  No obvious free air. Musculoskeletal: Mild degenerative changes along the spine. Degenerative changes along the pelvis. Stable sclerotic areas involving the spine at L2. IMPRESSION: Mass lesion extending  along the inferior aspect of the pancreatic head and uncinate process is overall similar to previous. 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 Multiple collaterals identified. Multiple liver lesions are again seen. There are several which have increased in size from previous close with progression. Several abnormal lymph nodes identified previously the retroperitoneum and in the pelvis have increased in size. However many are lower in density and there is more adjacent stranding particularly in the pelvis. Extensive edema along the pelvis including the pelvic sidewalls but also tracking along the perirectal and presacral spaces. There is significant wall thickening along the rectosigmoid colon. Please correlate for a colitis or other process. No new mass lesion, fluid collection or nodal enlargement in the thorax. Electronically Signed   By: Karen Kays M.D.   On: 03/23/2023 17:40    Microbiology: Results for orders placed or performed during the hospital encounter of 03/25/23  Resp panel by RT-PCR (RSV, Flu A&B, Covid) Anterior Nasal Swab     Status: None   Collection Time: 03/25/23 11:27 AM   Specimen: Anterior Nasal Swab  Result Value Ref Range Status   SARS Coronavirus 2 by RT PCR NEGATIVE NEGATIVE Final  Comment: (NOTE) SARS-CoV-2 target nucleic acids are NOT DETECTED.  The SARS-CoV-2 RNA is generally detectable in upper respiratory specimens during the acute phase of infection. The lowest concentration of SARS-CoV-2 viral copies this assay can detect is 138 copies/mL. A negative result does not preclude SARS-Cov-2 infection and should not be used as the sole basis for treatment or other patient management decisions. A negative result may occur with  improper specimen collection/handling, submission of specimen other than nasopharyngeal swab, presence of viral mutation(s) within the areas targeted by this assay, and inadequate  number of viral copies(<138 copies/mL). A negative result must be combined with clinical observations, patient history, and epidemiological information. The expected result is Negative.  Fact Sheet for Patients:  BloggerCourse.com  Fact Sheet for Healthcare Providers:  SeriousBroker.it  This test is no t yet approved or cleared by the [place] FDA and  has been authorized for detection and/or diagnosis of SARS-CoV-2 by FDA under an Emergency Use Authorization (EUA). This EUA will remain  in effect (meaning this test can be used) for the duration of the COVID-19 declaration under Section 564(b)(1) of the Act, 21 U.S.C.section 360bbb-3(b)(1), unless the authorization is terminated  or revoked sooner.       Influenza A by PCR NEGATIVE NEGATIVE Final   Influenza B by PCR NEGATIVE NEGATIVE Final    Comment: (NOTE) The Xpert Xpress SARS-CoV-2/FLU/RSV plus assay is intended as an aid in the diagnosis of influenza from Nasopharyngeal swab specimens and should not be used as a sole basis for treatment. Nasal washings and aspirates are unacceptable for Xpert Xpress SARS-CoV-2/FLU/RSV testing.  Fact Sheet for Patients: BloggerCourse.com  Fact Sheet for Healthcare Providers: SeriousBroker.it  This test is not yet approved or cleared by the [place] FDA and has been authorized for detection and/or diagnosis of SARS-CoV-2 by FDA under an Emergency Use Authorization (EUA). This EUA will remain in effect (meaning this test can be used) for the duration of the COVID-19 declaration under Section 564(b)(1) of the Act, 21 U.S.C. section 360bbb-3(b)(1), unless the authorization is terminated or revoked.     Resp Syncytial Virus by PCR NEGATIVE NEGATIVE Final    Comment: (NOTE) Fact Sheet for Patients: BloggerCourse.com  Fact Sheet for Healthcare  Providers: SeriousBroker.it  This test is not yet approved or cleared by the [place] FDA and has been authorized for detection and/or diagnosis of SARS-CoV-2 by FDA under an Emergency Use Authorization (EUA). This EUA will remain in effect (meaning this test can be used) for the duration of the COVID-19 declaration under Section 564(b)(1) of the Act, 21 U.S.C. section 360bbb-3(b)(1), unless the authorization is terminated or revoked.  Performed at [facility], [address], [place], [place] [address]     Labs: CBC: Recent Labs  Lab 03/23/23 0846 03/25/23 1127 03/26/23 0603  WBC 4.8 5.2 4.3  NEUTROABS 3.2 4.9  --   HGB 9.2* 9.4* 8.1*  HCT 29.1* 31.1* 27.0*  MCV 89.8 92.0 93.8  PLT 170 158 143*   Basic Metabolic Panel: Recent Labs  Lab 03/23/23 0846 03/25/23 1127 03/26/23 0603  NA 138 138 140  K 3.6 3.6 3.4*  CL 105 106 110  CO2 29 22 24   GLUCOSE 116* 119* 83  BUN 10 8 9   CREATININE 0.54 0.52 0.45  CALCIUM 9.0 9.1 8.0*  MG  --  2.3  --    Liver Function Tests: Recent Labs  Lab 03/23/23 0846 03/25/23 1127  AST 18 22  ALT 12  15  ALKPHOS 90 85  BILITOT 0.3 1.0  PROT 7.1 7.4  ALBUMIN 3.6 3.6   CBG: No results for input(s): "GLUCAP" in the last 168 hours.  Discharge time spent: greater than 30 minutes.  Signed: Meredeth Ide, MD [facility] 03/26/2023

## 2023-03-26 NOTE — Plan of Care (Signed)
  Problem: Education: Goal: Knowledge of General Education information will improve Description: Including pain rating scale, medication(s)/side effects and non-pharmacologic comfort measures 03/26/2023 0721 by Vic Blackbird, Ronita Hipps, RN Outcome: Progressing 03/26/2023 0719 by Vic Blackbird, Ronita Hipps, RN Outcome: Progressing   Problem: Health Behavior/Discharge Planning: Goal: Ability to manage health-related needs will improve 03/26/2023 0721 by Vic Blackbird, Ronita Hipps, RN Outcome: Progressing 03/26/2023 0719 by Vic Blackbird, Ronita Hipps, RN Outcome: Progressing   Problem: Clinical Measurements: Goal: Ability to maintain clinical measurements within normal limits will improve 03/26/2023 0721 by Vic Blackbird, Ronita Hipps, RN Outcome: Progressing 03/26/2023 0719 by Floydene Flock, RN Outcome: Progressing Goal: Will remain free from infection 03/26/2023 0721 by Vic Blackbird, Ronita Hipps, RN Outcome: Progressing 03/26/2023 0719 by Floydene Flock, RN Outcome: Progressing Goal: Diagnostic test results will improve 03/26/2023 0721 by Vic Blackbird, Ronita Hipps, RN Outcome: Progressing 03/26/2023 0719 by Floydene Flock, RN Outcome: Progressing Goal: Respiratory complications will improve 03/26/2023 0721 by Vic Blackbird, Ronita Hipps, RN Outcome: Progressing 03/26/2023 0719 by Floydene Flock, RN Outcome: Progressing Goal: Cardiovascular complication will be avoided 03/26/2023 0721 by Floydene Flock, RN Outcome: Progressing 03/26/2023 0719 by Floydene Flock, RN Outcome: Progressing   Problem: Activity: Goal: Risk for activity intolerance will decrease 03/26/2023 0721 by Vic Blackbird, Ronita Hipps, RN Outcome: Progressing 03/26/2023 0719 by Vic Blackbird, Ronita Hipps, RN Outcome:  Progressing   Problem: Nutrition: Goal: Adequate nutrition will be maintained 03/26/2023 0721 by Vic Blackbird, Ronita Hipps, RN Outcome: Progressing 03/26/2023 0719 by Vic Blackbird, Ronita Hipps, RN Outcome: Progressing   Problem: Coping: Goal: Level of anxiety will decrease 03/26/2023 0721 by Vic Blackbird, Ronita Hipps, RN Outcome: Progressing 03/26/2023 0719 by Floydene Flock, RN Outcome: Progressing   Problem: Elimination: Goal: Will not experience complications related to bowel motility 03/26/2023 0721 by Vic Blackbird, Ronita Hipps, RN Outcome: Progressing 03/26/2023 0719 by Floydene Flock, RN Outcome: Progressing Goal: Will not experience complications related to urinary retention 03/26/2023 0721 by Vic Blackbird, Ronita Hipps, RN Outcome: Progressing 03/26/2023 0719 by Floydene Flock, RN Outcome: Progressing   Problem: Pain Managment: Goal: General experience of comfort will improve and/or be controlled 03/26/2023 0721 by Vic Blackbird, Ronita Hipps, RN Outcome: Progressing 03/26/2023 0719 by Floydene Flock, RN Outcome: Progressing   Problem: Safety: Goal: Ability to remain free from injury will improve 03/26/2023 0721 by Vic Blackbird, Ronita Hipps, RN Outcome: Progressing 03/26/2023 0719 by Floydene Flock, RN Outcome: Progressing   Problem: Skin Integrity: Goal: Risk for impaired skin integrity will decrease 03/26/2023 0721 by Floydene Flock, RN Outcome: Progressing 03/26/2023 0719 by Floydene Flock, RN Outcome: Progressing

## 2023-03-26 NOTE — Plan of Care (Deleted)

## 2023-03-27 ENCOUNTER — Encounter: Payer: Self-pay | Admitting: Family Medicine

## 2023-03-31 ENCOUNTER — Other Ambulatory Visit: Payer: BC Managed Care – PPO

## 2023-03-31 ENCOUNTER — Ambulatory Visit: Payer: BC Managed Care – PPO

## 2023-03-31 ENCOUNTER — Ambulatory Visit: Payer: BC Managed Care – PPO | Admitting: Hematology and Oncology

## 2023-04-06 ENCOUNTER — Telehealth: Payer: BC Managed Care – PPO

## 2023-04-07 ENCOUNTER — Inpatient Hospital Stay: Payer: BC Managed Care – PPO

## 2023-04-07 ENCOUNTER — Inpatient Hospital Stay (HOSPITAL_BASED_OUTPATIENT_CLINIC_OR_DEPARTMENT_OTHER): Payer: BC Managed Care – PPO | Admitting: Hematology and Oncology

## 2023-04-07 VITALS — BP 135/72 | HR 84 | Temp 98.8°F | Resp 16 | Wt 119.3 lb

## 2023-04-07 DIAGNOSIS — N898 Other specified noninflammatory disorders of vagina: Secondary | ICD-10-CM | POA: Diagnosis not present

## 2023-04-07 DIAGNOSIS — Z5111 Encounter for antineoplastic chemotherapy: Secondary | ICD-10-CM | POA: Diagnosis not present

## 2023-04-07 DIAGNOSIS — R7989 Other specified abnormal findings of blood chemistry: Secondary | ICD-10-CM | POA: Diagnosis not present

## 2023-04-07 DIAGNOSIS — Z95828 Presence of other vascular implants and grafts: Secondary | ICD-10-CM

## 2023-04-07 DIAGNOSIS — R112 Nausea with vomiting, unspecified: Secondary | ICD-10-CM | POA: Diagnosis not present

## 2023-04-07 DIAGNOSIS — C259 Malignant neoplasm of pancreas, unspecified: Secondary | ICD-10-CM

## 2023-04-07 DIAGNOSIS — Z452 Encounter for adjustment and management of vascular access device: Secondary | ICD-10-CM | POA: Diagnosis not present

## 2023-04-07 DIAGNOSIS — Z7901 Long term (current) use of anticoagulants: Secondary | ICD-10-CM | POA: Diagnosis not present

## 2023-04-07 DIAGNOSIS — Z515 Encounter for palliative care: Secondary | ICD-10-CM | POA: Diagnosis not present

## 2023-04-07 DIAGNOSIS — D709 Neutropenia, unspecified: Secondary | ICD-10-CM | POA: Diagnosis not present

## 2023-04-07 DIAGNOSIS — I81 Portal vein thrombosis: Secondary | ICD-10-CM | POA: Diagnosis not present

## 2023-04-07 DIAGNOSIS — C787 Secondary malignant neoplasm of liver and intrahepatic bile duct: Secondary | ICD-10-CM | POA: Diagnosis not present

## 2023-04-07 DIAGNOSIS — C250 Malignant neoplasm of head of pancreas: Secondary | ICD-10-CM | POA: Diagnosis not present

## 2023-04-07 DIAGNOSIS — M549 Dorsalgia, unspecified: Secondary | ICD-10-CM | POA: Diagnosis not present

## 2023-04-07 DIAGNOSIS — N951 Menopausal and female climacteric states: Secondary | ICD-10-CM | POA: Diagnosis not present

## 2023-04-07 DIAGNOSIS — G4700 Insomnia, unspecified: Secondary | ICD-10-CM | POA: Diagnosis not present

## 2023-04-07 LAB — CBC WITH DIFFERENTIAL (CANCER CENTER ONLY)
Abs Immature Granulocytes: 0.01 10*3/uL (ref 0.00–0.07)
Basophils Absolute: 0 10*3/uL (ref 0.0–0.1)
Basophils Relative: 1 %
Eosinophils Absolute: 0.2 10*3/uL (ref 0.0–0.5)
Eosinophils Relative: 3 %
HCT: 30.1 % — ABNORMAL LOW (ref 36.0–46.0)
Hemoglobin: 9.2 g/dL — ABNORMAL LOW (ref 12.0–15.0)
Immature Granulocytes: 0 %
Lymphocytes Relative: 14 %
Lymphs Abs: 0.8 10*3/uL (ref 0.7–4.0)
MCH: 27.7 pg (ref 26.0–34.0)
MCHC: 30.6 g/dL (ref 30.0–36.0)
MCV: 90.7 fL (ref 80.0–100.0)
Monocytes Absolute: 0.7 10*3/uL (ref 0.1–1.0)
Monocytes Relative: 13 %
Neutro Abs: 4 10*3/uL (ref 1.7–7.7)
Neutrophils Relative %: 69 %
Platelet Count: 163 10*3/uL (ref 150–400)
RBC: 3.32 MIL/uL — ABNORMAL LOW (ref 3.87–5.11)
RDW: 17.2 % — ABNORMAL HIGH (ref 11.5–15.5)
WBC Count: 5.7 10*3/uL (ref 4.0–10.5)
nRBC: 0 % (ref 0.0–0.2)

## 2023-04-07 LAB — CMP (CANCER CENTER ONLY)
ALT: 9 U/L (ref 0–44)
AST: 17 U/L (ref 15–41)
Albumin: 3.7 g/dL (ref 3.5–5.0)
Alkaline Phosphatase: 96 U/L (ref 38–126)
Anion gap: 4 — ABNORMAL LOW (ref 5–15)
BUN: 9 mg/dL (ref 6–20)
CO2: 29 mmol/L (ref 22–32)
Calcium: 8.9 mg/dL (ref 8.9–10.3)
Chloride: 107 mmol/L (ref 98–111)
Creatinine: 0.88 mg/dL (ref 0.44–1.00)
GFR, Estimated: >60 mL/min (ref >60)
Glucose, Bld: 98 mg/dL (ref 70–99)
Potassium: 4 mmol/L (ref 3.5–5.1)
Sodium: 140 mmol/L (ref 135–145)
Total Bilirubin: 0.4 mg/dL (ref 0.0–1.2)
Total Protein: 7 g/dL (ref 6.5–8.1)

## 2023-04-07 MED ORDER — HEPARIN SOD (PORK) LOCK FLUSH 100 UNIT/ML IV SOLN
500.0000 [IU] | Freq: Once | INTRAVENOUS | Status: AC
  Administered 2023-04-07: 500 [IU] via INTRAVENOUS

## 2023-04-07 MED ORDER — SODIUM CHLORIDE 0.9% FLUSH
10.0000 mL | Freq: Once | INTRAVENOUS | Status: AC
  Administered 2023-04-07: 10 mL

## 2023-04-07 MED ORDER — SODIUM CHLORIDE 0.9% FLUSH
10.0000 mL | INTRAVENOUS | Status: DC | PRN
  Administered 2023-04-07: 10 mL via INTRAVENOUS

## 2023-04-07 NOTE — Progress Notes (Signed)
 [facility] [phone]   [phone]  PROGRESS NOTE:  Patient Care Team: Bernadette Hoit, MD as PCP - General (Family Medicine) Pickenpack-Cousar, Arty Baumgartner, NP as Nurse Practitioner (Nurse Practitioner) Jaci Standard, MD as Consulting Physician (Hematology and Oncology)  CHIEF COMPLAINTS/PURPOSE OF CONSULTATION:  Metastatic pancreatic adenocarcinoma involving the liver  ONCOLOGIC HISTORY: Presented with nausea and right upper quadrant pain 03/08/2022: Abdominal US showed multiple hypoechoic masses within the liver are indeterminate.  03/10/2022: MR abdomen: Hypoenhancing mass compatible with pancreatic head adenocarcinoma with numerous targetoid enhancing masses in all segments of the liver, compatible with metastatic disease. The mass severely effaces and nearly occludes the portal vein and splenic vein, and occludes the superior mesenteric vein with some collaterals noted. The mass substantially abuts the proximal transverse duodenum, although overt duodenal invasion is indeterminate. 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 Prominent stool throughout the colon favors constipation. Fluid-fluid level in the gallbladder likely from sludge. 03/14/2022: Establish care with [facility] 03/17/2022: CT chest: no definitive evidence of lung metastases.  03/22/2022: Liver biopsy confirmed metastatic adenocarcinoma, pancreatic primary.  03/30/2022: Cycle 1, Day 1 of mFOLFIRINOX 04/14/2022: Cycle 2, Day 1 of mFOLFIRINOX HELD due to ANC 300.  04/20/2022: Cycle 2, Day 1 of mFOLFIRINOX 05/04/2022: Cycle 3, Day 1 of  mFOLFIRINOX 05/19/2022: Cycle 4, Day 1 of mFOLFIRINOX HELD per patient request.  07/21/2022: Cycle 4, Day 1 of mFOLFIRINOX 08/04/2022: Cycle 5, Day 1 of mFOLFIRINOX HELD due to Plt 81K 08/17/2022: Cycle 5, Day 1 of mFOLFIRINOX 09/01/2022: Cycle 6, Day 1 of mFOLFIRINOX 09/14/2022: Cycle 7, Day 1 of mFOLFIRINOX HELD due to Plt 31K 09/22/2022: Cycle 7, Day 1 of mFOLFIRINOX HELD due to Plt 73K 10/20/2022 CT C/A/P showed mixed picture with progression of disease. FOLFIRINOX d/c.  11/10/2022: Cycle 1 Day 1 of Gemcitabine/Abraxane.  11/24/2022: treatment held due to cytopenias.  12/23/2022: Cycle 2 Day 8 of Gemcitabine (reduced to 800 mg /m2) and Abraxane (reduced to 100 mg)    HISTORY OF PRESENTING ILLNESS:  Kaitlin Ferrell presents today for a follow up. She was last seen on 03/03/2023.  Patient presents today prior to Cycle 5 Day 1 of Gem/Abraxane.   Ms. Runkel reports she has been okay over the last 2 weeks.  She did reportedly end up in the hospital due to intractable nausea and vomiting.  She was not having any diarrhea.  She reports she "could not keep anything down".  She reports he tried her home nausea meds but it was not effective.  She thinks that this was due to chemotherapy and not due to a GI bug.  She reports she does still feel somewhat nauseated.  She reports that she is tired a lot and her appetite has been poor.  She tries to force herself to eat.  Fortunately she has not lost any weight.  She reports that she is doing her best to try to rest..  She denies fevers, chills, sweats, shortness of breath, chest pain or cough. She has no other complaints. Rest of the 10 point ROS is below.  Unfortunately we had to discontinue chemotherapy today due to her progression of disease on her last  CT scan.  We discussed that there are no good viable options for second line of chemotherapy.  She does not have markers necessary for targeted therapy or for immunotherapy.  Therefore we are left with no  options for continued treatment.  We discussed looking for, trials, but unfortunate does not appear anything is available locally for the situation.  We discussed the poor prognosis and the need to discontinue chemotherapy.  She voiced understanding of the situation.    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   treated with 4 cycles of actinomycin D from 12-02-11 thru 01-13-12   H/O molar pregnancy, antepartum    Hypertension    Migraine    Miscarriage     SURGICAL HISTORY: Past Surgical History:  Procedure Laterality Date   BREAST BIOPSY  2001   Left   BREAST LUMPECTOMY     DILATION AND CURETTAGE OF UTERUS  1992, 09/2011   DILATION AND EVACUATION  2011   INSERTION OF MESH N/A 09/03/2013   Procedure: INSERTION OF MESH;  Surgeon: Ardeth Sportsman, MD;  Location: [facility];  Service: General;  Laterality: N/A;   IR CHEST FLUORO  04/14/2022   IR IMAGING GUIDED PORT INSERTION  03/22/2022   IR IMAGING GUIDED PORT INSERTION  05/02/2022   IR PORT REPAIR CENTRAL VENOUS ACCESS DEVICE  04/19/2022   IR REMOVAL TUN CV CATH W/O FL  05/02/2022   IR US LIVER BIOPSY  03/22/2022   PORTACATH PLACEMENT  11/2011   PORTACATH PLACEMENT     REMOVAL  MARCH 2014   VENTRAL HERNIA REPAIR N/A 09/03/2013   Procedure: LAPAROSCOPIC VENTRAL WALL HERNIA REPAIR;  Surgeon: Ardeth Sportsman, MD;  Location: [facility];  Service: General;  Laterality: N/A;   WISDOM TOOTH EXTRACTION     in 11th grade    SOCIAL HISTORY: Social History   Socioeconomic History   Marital status: Married    Spouse name: [name]   Number of children: 2   Years of education: college   Highest education level: Not on file  Occupational History    Comment: C[employer]  Tobacco Use   Smoking status: Never   Smokeless tobacco: Never  Substance and  Sexual Activity   Alcohol use: Yes    Alcohol/week: 1.0 standard drink of alcohol    Types: 1 Glasses of wine per week    Comment: occas   Drug use: No   Sexual activity: Not Currently    Birth control/protection: Pill  Other Topics Concern   Not on file  Social History Narrative   Patient is married [name]). Patient works part time at [employer].   Right handed.   Patient has her CMA.   Caffeine- None         Social Drivers of Corporate investment banker Strain: Not on file  Food Insecurity: No Food Insecurity (03/25/2023)   Hunger Vital Sign    Worried About Running Out of Food in the Last Year: Never true    Ran Out of Food in the Last Year: Never true  Transportation Needs: No Transportation Needs (03/25/2023)   PRAPARE - Administrator, Civil Service (Medical): No    Lack of Transportation (Non-Medical): No  Physical Activity: Not on file  Stress: Not on file  Social Connections: Not on file  Intimate Partner Violence: Not At Risk (03/25/2023)   Humiliation, Afraid, Rape, and Kick questionnaire    Fear of Current or Ex-Partner: No    Emotionally Abused: No    Physically Abused: No    Sexually Abused: No    FAMILY HISTORY: Family History  Adopted: Yes  Problem Relation Age of Onset   Breast cancer Mother        dx > 50   Heart attack Mother    Diabetes Father    Breast cancer Maternal Grandmother        ? < 50   Stroke Paternal Grandmother    Breast cancer Maternal Aunt        dx > 50   Breast cancer Maternal Aunt        dx > 50   Alcoholism Other     ALLERGIES:  is allergic to compazine [prochlorperazine edisylate], propofol, oxycodone-acetaminophen, prochlorperazine, and labetalol.  MEDICATIONS:  Current Outpatient Medications  Medication Sig Dispense Refill   Cyanocobalamin (VITAMIN B-12 CR PO) Take by mouth.     diphenoxylate-atropine (LOMOTIL) 2.5-0.025 MG tablet Take 1 tablet by mouth 4 (four) times daily as needed for diarrhea or  loose stools. 30 tablet 0   eletriptan (RELPAX) 40 MG tablet Take 1 tablet (40 mg total) by mouth as needed for migraine or headache. May repeat in 2 hours if needed. 15 tablet 6   Folic Acid (FOLATE PO) Place 1 mg under the tongue daily.     lidocaine-prilocaine (EMLA) cream Apply 1 Application topically as needed. 30 g 2   LORazepam (ATIVAN) 0.5 MG tablet Take 0.5 mg by mouth as needed for anxiety.     magic mouthwash (multi-ingredient) oral suspension Swish and swallow 5 mLs by mouth 4 times daily as needed for throat discomfort 400 mL 1   magnesium oxide (MAG-OX) 400 (241.3 MG) MG tablet TAKE 1 TABLET BY MOUTH TWICE A DAY 60 tablet 6   morphine (MS CONTIN) 15 MG 12 hr tablet Take 1 tablet (15 mg total) by mouth every 8 (eight) hours. Take in addition to MS Contin 30 mg to total 45mg  every 8 hours. 90 tablet 0   morphine (MS CONTIN) 30 MG 12 hr tablet Take 1 tablet (30 mg total) by mouth every 8 (eight) hours. 90 tablet 0   morphine (MSIR) 15 MG tablet Take 1 tablet (15 mg total) by mouth every 4 (four) hours as needed for severe pain (pain score 7-10) or moderate pain (pain score 4-6). 60 tablet 0   OLANZapine (ZYPREXA) 10 MG tablet TAKE 1 TABLET BY MOUTH EVERYDAY AT BEDTIME 90 tablet 1   ondansetron (ZOFRAN) 8 MG tablet Take 1 tablet (8 mg total) by mouth every 8 (eight) hours as needed for nausea or vomiting. 90 tablet 0   ondansetron (ZOFRAN-ODT) 8 MG disintegrating tablet Take 1 tablet (8 mg total) by mouth every 8 (eight) hours as needed for nausea or vomiting. 20 tablet 0   pantoprazole (PROTONIX) 40 MG tablet Take 40 mg by mouth every morning.     promethazine (PHENERGAN) 25 MG tablet Take 1 tablet (25 mg total) by mouth every 6 (six) hours as needed for nausea or vomiting. 30 tablet 0   scopolamine (TRANSDERM-SCOP) 1 MG/3DAYS Place 1 patch (1.5 mg total) onto the skin every 3 (three) days. 10 patch 12   YUVAFEM 10 MCG TABS vaginal tablet Place 1 tablet vaginally 2 (two) times a week. For  14  days, then insert vaginally twice weekly.     No current facility-administered medications for this visit.    REVIEW OF SYSTEMS:   Constitutional: ( - ) fevers, ( - )  chills , ( - ) night sweats Eyes: ( - ) blurriness of vision, ( - ) double vision, ( - ) watery eyes Ears, nose, mouth, throat, and face: ( - ) mucositis, ( - ) sore throat Respiratory: ( - ) cough, ( - ) dyspnea, ( - ) wheezes Cardiovascular: ( - ) palpitation, ( - ) chest discomfort, ( - ) lower extremity swelling Gastrointestinal:  (-) nausea, ( - ) heartburn, ( -) change in bowel habits Skin: ( - ) abnormal skin rashes Lymphatics: ( - ) new lymphadenopathy, ( + ) easy bruising Neurological: ( - ) numbness, ( - ) tingling, ( - ) new weaknesses Behavioral/Psych: ( - ) mood change, ( - ) new changes  All other systems were reviewed with the patient and are negative.  PHYSICAL EXAMINATION: ECOG PERFORMANCE STATUS: 1 - Symptomatic but completely ambulatory  There were no vitals filed for this visit.   There were no vitals filed for this visit.    GENERAL: well appearing female in NAD  SKIN: skin color, texture, turgor are normal, no rashes or significant lesions.  EYES: conjunctiva are pink and non-injected, sclera clear LUNGS: clear to auscultation and percussion with normal breathing effort HEART: regular rate & rhythm and no murmurs and no lower extremity edema Musculoskeletal: no cyanosis of digits and no clubbing  PSYCH: alert & oriented x 3, fluent speech NEURO: no focal motor/sensory deficits  LABORATORY DATA:  I have reviewed the data as listed    Latest Ref Rng & Units 03/26/2023    6:03 AM 03/25/2023   11:27 AM 03/23/2023    8:46 AM  CBC  WBC 4.0 - 10.5 K/uL 4.3  5.2  4.8   Hemoglobin 12.0 - 15.0 g/dL 8.1  9.4  9.2   Hematocrit 36.0 - 46.0 % 27.0  31.1  29.1   Platelets 150 - 400 K/uL 143  158  170        Latest Ref Rng & Units 03/26/2023    6:03 AM 03/25/2023   11:27 AM 03/23/2023    8:46  AM  CMP  Glucose 70 - 99 mg/dL 83  161  096   BUN 6 - 20 mg/dL 9  8  10    Creatinine 0.44 - 1.00 mg/dL 0.45  4.09  8.11   Sodium 135 - 145 mmol/L 140  138  138   Potassium 3.5 - 5.1 mmol/L 3.4  3.6  3.6   Chloride 98 - 111 mmol/L 110  106  105   CO2 22 - 32 mmol/L 24  22  29    Calcium 8.9 - 10.3 mg/dL 8.0  9.1  9.0   Total Protein 6.5 - 8.1 g/dL  7.4  7.1   Total Bilirubin 0.0 - 1.2 mg/dL  1.0  0.3   Alkaline Phos 38 - 126 U/L  85  90   AST 15 - 41 U/L  22  18   ALT 0 - 44 U/L  15  12      RADIOGRAPHIC STUDIES: I have personally reviewed the radiological images as listed and agreed with the findings in the report. CT CHEST ABDOMEN PELVIS W CONTRAST Result Date: 03/23/2023 CLINICAL DATA:  Pancreatic adenocarcinoma restaging. * Tracking Code: BO * EXAM: CT CHEST, ABDOMEN, AND PELVIS WITH CONTRAST  TECHNIQUE: Multidetector CT imaging of the chest, abdomen and pelvis was performed following the standard protocol during bolus administration of intravenous contrast. RADIATION DOSE REDUCTION: This exam was performed according to the departmental dose-optimization program which includes automated exposure control, adjustment of the mA and/or kV according to patient size and/or use of iterative reconstruction technique. CONTRAST:  OMNIPAQUE IOHEXOL 300 MG/ML  SOLN COMPARISON:  CT 01/20/2023 and older. FINDINGS: CT CHEST FINDINGS Cardiovascular: Left upper chest port in place. Tip extending into the right atrium. Heart is nonenlarged. No pericardial effusion. The thoracic aorta is normal course and caliber with slight atherosclerotic plaque. Calcified aorta. Bovine type aortic arch. Mediastinum/Nodes: No specific abnormal lymph node enlargement present in the axillary regions, hilum or mediastinum. Normal caliber thoracic esophagus. Preserved thyroid gland. Lungs/Pleura: Bilateral apical pleural thickening identified. No consolidation, pneumothorax or effusion. Punctate calcifications with reticular  changes along the lateral right lower lobe is unchanged from previous and possibly sequela of old infectious or inflammatory process. Musculoskeletal: Mild degenerative changes along the spine. A stable area of sclerosis along the T9 vertebral body. CT ABDOMEN PELVIS FINDINGS Hepatobiliary: Liver metastases are again seen. These are scattered throughout the liver. Specific lesions will be followed for continuity. Example peripheral right lower lobe measuring 15 x 15 mm previously, today on series 2, image 36 measures 2.1 x 2.0 cm. Segment 4A lesion which previously measured 10 x 7 mm, today is similar on series 7, image 80. However lobe lateral to this is a larger lesion which previously measured 15 x 11 mm and today on series 7, image 81 18 x 17 mm. Other lesions are also increased in size. Patent portal vein. Gallbladder is nondilated. Pancreas: Slight pancreatic duct ectasia. Relatively preserved pancreatic parenchyma. Along the inferior aspect however of the pancreatic head and uncinate process is a locally infiltrative mass. Previously this was measured at 4.1 x 3.0 cm. When remeasured today using the same technique this mass encasing the SMA on series 7, image 105 measures 4.0 x 3.3 cm. Similar. Measurements are difficult due to the nature of the lesion and location. 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 Spleen: Normal in size without focal abnormality. Adrenals/Urinary Tract: Adrenal glands are unremarkable. Kidneys are normal, without renal calculi, focal lesion, or hydronephrosis. Bladder is underdistended but has some wall thickening. Adjacent stranding. Stomach/Bowel: On this non oral contrast exam the stomach is nondilated. Small bowel is nondilated. There are loops of small bowel stool appearance in the pelvis, nonspecific. The cecum resides in the anterior low pelvis just above the bladder. Diffuse colonic  stool identified. There is presence of wall thickening with some stranding along the rectosigmoid colon which is new from previous. There is significant perirectal fat and presacral space stranding. Edema tracks along the pelvic sidewalls as well. Edema tracks along the lower abdominal retroperitoneum. Vascular/Lymphatic: Normal caliber aorta and IVC. Once again there are several abnormal nodes previously identified. These include a aortocaval node which previously had a short axis of 7 mm and today on series 7, image 114 measures 9 mm. Left common iliac chain node which previously had short axis of 11 mm, today measures 11 mm on image 135. Left pelvic sidewall node short axis of 11 mm, today measures 15 mm. This is larger but the node is more low in density diffusely. New right-sided pelvic sidewall node measures 15 mm on series 7, image 151. Nodes in the right  hemipelvis are also more low in density. Additional retrocaval node on series 2, image 45 measures 10 mm in short axis today. Previously this node measured 4 mm in short axis. Reproductive: Status post hysterectomy. No adnexal masses. Other: Anasarca.  Trace ascites.  No obvious free air. Musculoskeletal: Mild degenerative changes along the spine. Degenerative changes along the pelvis. Stable sclerotic areas involving the spine at L2. IMPRESSION: Mass lesion extending along the inferior aspect of the pancreatic head and uncinate process is overall similar to previous. 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 Multiple collaterals identified. Multiple liver lesions are again seen. There are several which have increased in size from previous close with progression. Several abnormal lymph nodes identified previously the retroperitoneum and in the pelvis have increased in size. However many are lower in density and there is more adjacent stranding particularly in the pelvis. Extensive edema along the  pelvis including the pelvic sidewalls but also tracking along the perirectal and presacral spaces. There is significant wall thickening along the rectosigmoid colon. Please correlate for a colitis or other process. No new mass lesion, fluid collection or nodal enlargement in the thorax. Electronically Signed   By: Karen Kays M.D.   On: 03/23/2023 17:40     ASSESSMENT & PLAN Jamica Woodyard is a 58 y.o. female who returns to the clinic for newly diagnosed pancreatic adenocarcinoma.    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 Dr. Leonides Schanz recommends chemotherapy regimen with FOLFIRINOX q 2 weeks.  --Plan to obtain restaging CT scans every 3 months to assess treatment response.  --[facility] Testing: MS-stable, 2 Muts/Mb, [id number]*31, KRASG12L, MDM2 amplification, CDKN2A/B. No targetable mutations.  --Started mFOLFIRINOX on 03/30/2022 --After Cycle 3 on 05/04/2022, patient was on a treatment break due to poor tolerance and patient's request. --Most recent CT CAP from 06/30/2022 showed treatment response. --Patient resumed FOLFIRINOX without dose modification on 07/21/2022  --progression noted on CT scan from 10/20/2022, d/c FOLFIRINOX.  PLAN: --Labs from today reviewed with patient.  White blood cell 5.7, hemoglobin 9.2, MCV 90.7, platelets 163. Creatinine and LFTs in range. CA19-9 marker 737 on 03/03/2023, down from 1987 earlier that month.  -- HOLD chemo due to progression of disease.  No viable targetable mutations or immunotherapy is available for her at this time.  Search for local clinical trials with no options available. -- Return to clinic in 3 weeks to reassess.     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  --Currently on Eliquis 5 mg BID  #Neutropenia --Neutropenic precautions given to patient including monitoring for fevers.   #Elevated LFTs-normalized --Most likely secondary to chemotherapy. LFTs today within normal limits. --Monitor for now  #Nausea/Vomiting: --Secondary to chemotherapy and well controlled.  -- Continue to take scopolamine patch, phenergan 25 mg q 6 hours, zofran 8mg  q8H PRN for nausea, zyprexa nightly PRN for nausea.   #Epigastric/RUQ/back pain: --Likely secondary to underlying malignancy --Tramadol was ineffective.  --Current pain regimen includes MS contin to 30 mg q 8 hours and Norco 10-325 mg.   #Appetite loss/weight loss: --Weight  has improved since last week from 118 to 122 lbs today.  --Prescribed Marinol but is not currently taking it. --Encouraged to eat small, frequent meals and supplement with protein shakes --Follows with [facility] nutrition team  #Family history of breast cancer: --Due to strong family history of cancer and has undergone genetic testing in the past.  --Underwent genetic testing on 04/07/2022 that was negative. 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   #Supportive Care -- port placement complete  -- EMLA cream for port  No orders of the defined types were placed in this encounter.  All questions were answered. The patient knows to call the clinic with any problems, questions or concerns.  I have spent a total of 30 minutes minutes of face-to-face and non-face-to-face time, preparing to see the patient,  performing a medically appropriate examination, counseling and educating the patient, documenting clinical information in the electronic health record,  and care coordination.   Ulysees Barns,  MD Department of Hematology/Oncology [facility] at [facility] Phone: [phone] Pager: [phone] Email: [email or web address]

## 2023-04-08 ENCOUNTER — Encounter: Payer: Self-pay | Admitting: Hematology and Oncology

## 2023-04-10 ENCOUNTER — Other Ambulatory Visit (HOSPITAL_COMMUNITY): Payer: Self-pay

## 2023-04-10 ENCOUNTER — Encounter: Payer: Self-pay | Admitting: Hematology and Oncology

## 2023-04-10 ENCOUNTER — Emergency Department (HOSPITAL_COMMUNITY)
Admission: EM | Admit: 2023-04-10 | Discharge: 2023-04-10 | Disposition: A | Discharge status: Home / Self Care | Attending: Emergency Medicine | Admitting: Emergency Medicine

## 2023-04-10 ENCOUNTER — Telehealth: Payer: Self-pay | Admitting: *Deleted

## 2023-04-10 ENCOUNTER — Inpatient Hospital Stay (HOSPITAL_BASED_OUTPATIENT_CLINIC_OR_DEPARTMENT_OTHER): Attending: Physician Assistant | Admitting: Nurse Practitioner

## 2023-04-10 ENCOUNTER — Encounter: Payer: Self-pay | Admitting: Nurse Practitioner

## 2023-04-10 DIAGNOSIS — C787 Secondary malignant neoplasm of liver and intrahepatic bile duct: Secondary | ICD-10-CM | POA: Insufficient documentation

## 2023-04-10 DIAGNOSIS — C259 Malignant neoplasm of pancreas, unspecified: Secondary | ICD-10-CM

## 2023-04-10 DIAGNOSIS — C250 Malignant neoplasm of head of pancreas: Secondary | ICD-10-CM | POA: Insufficient documentation

## 2023-04-10 DIAGNOSIS — Z515 Encounter for palliative care: Secondary | ICD-10-CM

## 2023-04-10 DIAGNOSIS — R1084 Generalized abdominal pain: Secondary | ICD-10-CM | POA: Insufficient documentation

## 2023-04-10 DIAGNOSIS — Z8507 Personal history of malignant neoplasm of pancreas: Secondary | ICD-10-CM | POA: Insufficient documentation

## 2023-04-10 DIAGNOSIS — Z7189 Other specified counseling: Secondary | ICD-10-CM | POA: Diagnosis not present

## 2023-04-10 DIAGNOSIS — R530 Neoplastic (malignant) related fatigue: Secondary | ICD-10-CM

## 2023-04-10 DIAGNOSIS — G893 Neoplasm related pain (acute) (chronic): Secondary | ICD-10-CM | POA: Diagnosis not present

## 2023-04-10 LAB — CBC WITH DIFFERENTIAL/PLATELET
Abs Immature Granulocytes: 0.02 10*3/uL (ref 0.00–0.07)
Basophils Absolute: 0 10*3/uL (ref 0.0–0.1)
Basophils Relative: 1 %
Eosinophils Absolute: 0.1 10*3/uL (ref 0.0–0.5)
Eosinophils Relative: 2 %
HCT: 31.6 % — ABNORMAL LOW (ref 36.0–46.0)
Hemoglobin: 9.7 g/dL — ABNORMAL LOW (ref 12.0–15.0)
Immature Granulocytes: 0 %
Lymphocytes Relative: 13 %
Lymphs Abs: 0.7 10*3/uL (ref 0.7–4.0)
MCH: 28.4 pg (ref 26.0–34.0)
MCHC: 30.7 g/dL (ref 30.0–36.0)
MCV: 92.7 fL (ref 80.0–100.0)
Monocytes Absolute: 0.8 10*3/uL (ref 0.1–1.0)
Monocytes Relative: 16 %
Neutro Abs: 3.4 10*3/uL (ref 1.7–7.7)
Neutrophils Relative %: 68 %
Platelets: 161 10*3/uL (ref 150–400)
RBC: 3.41 MIL/uL — ABNORMAL LOW (ref 3.87–5.11)
RDW: 16.9 % — ABNORMAL HIGH (ref 11.5–15.5)
WBC: 5 10*3/uL (ref 4.0–10.5)
nRBC: 0 % (ref 0.0–0.2)

## 2023-04-10 LAB — COMPREHENSIVE METABOLIC PANEL
ALT: 9 U/L (ref 0–44)
AST: 18 U/L (ref 15–41)
Albumin: 3.5 g/dL (ref 3.5–5.0)
Alkaline Phosphatase: 87 U/L (ref 38–126)
Anion gap: 8 (ref 5–15)
BUN: 11 mg/dL (ref 6–20)
CO2: 27 mmol/L (ref 22–32)
Calcium: 9 mg/dL (ref 8.9–10.3)
Chloride: 102 mmol/L (ref 98–111)
Creatinine, Ser: 1.06 mg/dL — ABNORMAL HIGH (ref 0.44–1.00)
GFR, Estimated: >60 mL/min (ref >60)
Glucose, Bld: 111 mg/dL — ABNORMAL HIGH (ref 70–99)
Potassium: 3.6 mmol/L (ref 3.5–5.1)
Sodium: 137 mmol/L (ref 135–145)
Total Bilirubin: 0.8 mg/dL (ref 0.0–1.2)
Total Protein: 7.3 g/dL (ref 6.5–8.1)

## 2023-04-10 LAB — LIPASE, BLOOD: Lipase: 20 U/L (ref 11–51)

## 2023-04-10 MED ORDER — HYDROMORPHONE HCL 2 MG/ML IJ SOLN
2.0000 mg | Freq: Once | INTRAMUSCULAR | Status: AC
  Administered 2023-04-10: 2 mg via INTRAVENOUS
  Filled 2023-04-10: qty 1

## 2023-04-10 MED ORDER — HYDROMORPHONE HCL 2 MG PO TABS: 2.0000 mg | ORAL_TABLET | ORAL | 0 refills | Status: DC | PRN

## 2023-04-10 MED ORDER — ONDANSETRON HCL 4 MG/2ML IJ SOLN
4.0000 mg | Freq: Once | INTRAMUSCULAR | Status: AC
  Administered 2023-04-10: 4 mg via INTRAVENOUS
  Filled 2023-04-10: qty 2

## 2023-04-10 MED ORDER — HYDROMORPHONE HCL 2 MG PO TABS
2.0000 mg | ORAL_TABLET | ORAL | 0 refills | Status: DC | PRN
Start: 2023-04-10 — End: 2023-04-28
  Filled 2023-04-10: qty 60, 10d supply, fill #0

## 2023-04-10 NOTE — ED Triage Notes (Signed)
 X  3 days, pt also c/o back pain, pt reports hx of the same, is taking meds with no relief , pt with hx of pancreatic CA, recently stopped tx.  Pt denies n/v/d associated with pain. Pain 7/10.

## 2023-04-10 NOTE — ED Provider Notes (Signed)
 [place] EMERGENCY DEPARTMENT AT [facility] Provider Note   CSN: [id number] Arrival date & time: 04/10/23  0402     History  Chief Complaint  Patient presents with   Abdominal Pain    Kaitlin Ferrell is a 58 y.o. female.  58 yo F with a chief complaints of uncontrolled abdominal pain.  Patient unfortunately has a history of pancreatic cancer that is not responding to chemotherapy.  She is taking 45 mg of morphine with 15 mg of breakthrough and has been taking that throughout the weekend without improvement.  She denies fevers denies vomiting.  Feels like the pain she has been having but just is unrelenting.     Abdominal Pain      Home Medications Prior to Admission medications   Medication Sig Start Date End Date Taking? Authorizing Provider  Cyanocobalamin (VITAMIN B-12 CR PO) Take by mouth.    [provider]  diphenoxylate-atropine (LOMOTIL) 2.5-0.025 MG tablet Take 1 tablet by mouth 4 (four) times daily as needed for diarrhea or loose stools. 04/11/22   Briant Cedar, PA-C  eletriptan (RELPAX) 40 MG tablet Take 1 tablet (40 mg total) by mouth as needed for migraine or headache. May repeat in 2 hours if needed. 09/22/14   Nilda Riggs, NP  Folic Acid (FOLATE PO) Place 1 mg under the tongue daily.    [provider]  lidocaine-prilocaine (EMLA) cream Apply 1 Application topically as needed. 09/29/22   Pickenpack-Cousar, Arty Baumgartner, NP  LORazepam (ATIVAN) 0.5 MG tablet Take 0.5 mg by mouth as needed for anxiety. 03/22/22   [provider]  magic mouthwash (multi-ingredient) oral suspension Swish and swallow 5 mLs by mouth 4 times daily as needed for throat discomfort 04/26/22   Georga Kaufmann T, PA-C  magnesium oxide (MAG-OX) 400 (241.3 MG) MG tablet TAKE 1 TABLET BY MOUTH TWICE A DAY 11/16/14   Nilda Riggs, NP  morphine (MS CONTIN) 15 MG 12 hr tablet Take 1 tablet (15 mg total) by mouth every 8 (eight) hours. Take in  addition to MS Contin 30 mg to total 45mg  every 8 hours. 03/23/23   Pickenpack-Cousar, Arty Baumgartner, NP  morphine (MS CONTIN) 30 MG 12 hr tablet Take 1 tablet (30 mg total) by mouth every 8 (eight) hours. 03/23/23   Pickenpack-Cousar, Arty Baumgartner, NP  morphine (MSIR) 15 MG tablet Take 1 tablet (15 mg total) by mouth every 4 (four) hours as needed for severe pain (pain score 7-10) or moderate pain (pain score 4-6). 03/23/23   Pickenpack-Cousar, Arty Baumgartner, NP  OLANZapine (ZYPREXA) 10 MG tablet TAKE 1 TABLET BY MOUTH EVERYDAY AT BEDTIME 04/20/22   Georga Kaufmann T, PA-C  ondansetron (ZOFRAN) 8 MG tablet Take 1 tablet (8 mg total) by mouth every 8 (eight) hours as needed for nausea or vomiting. 03/14/22   Georga Kaufmann T, PA-C  ondansetron (ZOFRAN-ODT) 8 MG disintegrating tablet Take 1 tablet (8 mg total) by mouth every 8 (eight) hours as needed for nausea or vomiting. 05/17/22   Walisiewicz, Kaitlyn E, PA-C  pantoprazole (PROTONIX) 40 MG tablet Take 40 mg by mouth every morning. 03/17/23   [provider]  promethazine (PHENERGAN) 25 MG tablet Take 1 tablet (25 mg total) by mouth every 6 (six) hours as needed for nausea or vomiting. 05/04/22   Jaci Standard, MD  scopolamine (TRANSDERM-SCOP) 1 MG/3DAYS Place 1 patch (1.5 mg total) onto the skin every 3 (three) days. 07/22/22   Briant Cedar,  PA-C  YUVAFEM 10 MCG TABS vaginal tablet Place 1 tablet vaginally 2 (two) times a week. For 14 days, then insert vaginally twice weekly. 03/03/23   [provider]      Allergies    Compazine [prochlorperazine edisylate], Propofol, Oxycodone-acetaminophen, Prochlorperazine, and Labetalol    Review of Systems   Review of Systems  Gastrointestinal:  Positive for abdominal pain.    Physical Exam Updated Vital Signs BP (!) 158/86 (BP Location: Right Arm)   Pulse 86   Temp 99.7 F (37.6 C) (Oral)   Resp 16   LMP 06/26/2013 Comment: low dose BCP  SpO2 100%  Physical Exam Vitals and nursing note reviewed.   Constitutional:      General: She is not in acute distress.    Appearance: She is well-developed. She is not diaphoretic.  HENT:     Head: Normocephalic and atraumatic.  Eyes:     Pupils: Pupils are equal, round, and reactive to light.  Cardiovascular:     Rate and Rhythm: Normal rate and regular rhythm.     Heart sounds: No murmur heard.    No friction rub. No gallop.  Pulmonary:     Effort: Pulmonary effort is normal.     Breath sounds: No wheezing or rales.  Abdominal:     General: There is no distension.     Palpations: Abdomen is soft.     Tenderness: There is abdominal tenderness (diffuse).  Musculoskeletal:        General: No tenderness.     Cervical back: Normal range of motion and neck supple.  Skin:    General: Skin is warm and dry.  Neurological:     Mental Status: She is alert and oriented to person, place, and time.  Psychiatric:        Behavior: Behavior normal.     ED Results / Procedures / Treatments   Labs (all labs ordered are listed, but only abnormal results are displayed) Labs Reviewed  CBC WITH DIFFERENTIAL/PLATELET - Abnormal; Notable for the following components:      Result Value   RBC 3.41 (*)    Hemoglobin 9.7 (*)    HCT 31.6 (*)    RDW 16.9 (*)    All other components within normal limits  COMPREHENSIVE METABOLIC PANEL - Abnormal; Notable for the following components:   Glucose, Bld 111 (*)    Creatinine, Ser 1.06 (*)    All other components within normal limits  LIPASE, BLOOD    EKG None  Radiology No results found.  Procedures Procedures    Medications Ordered in ED Medications  HYDROmorphone (DILAUDID) injection 2 mg (2 mg Intravenous Given 04/10/23 0444)  ondansetron (ZOFRAN) injection 4 mg (4 mg Intravenous Given 04/10/23 0443)    ED Course/ Medical Decision Making/ A&P                                 Medical Decision Making Amount and/or Complexity of Data Reviewed Labs: ordered.  Risk Prescription drug  management.   58 yo F with a chief complaints of diffuse abdominal discomfort.  This has been going on throughout the weekend.  Is gotten to the point where she could not tolerate anymore and came in for evaluation.  She has a history of pancreatic adenocarcinoma.  On my record review she unfortunately has had worsening on CT scan done less than a month ago and was taken off chemotherapy  due to futility.  Will attempt to aggressively control the patient's pain.  Reassess.  Patient is feeling a bit better after a dose of pain medicine here.  She like to try and go home.  She plans to contact her oncologist.  LFTs lipase unremarkable.  No acute anemia.  5:36 AM:  I have discussed the diagnosis/risks/treatment options with the patient and family.  Evaluation and diagnostic testing in the emergency department does not suggest an emergent condition requiring admission or immediate intervention beyond what has been performed at this time.  They will follow up with Oncology. We also discussed returning to the ED immediately if new or worsening sx occur. We discussed the sx which are most concerning (e.g., sudden worsening pain, fever, inability to tolerate by mouth) that necessitate immediate return. Medications administered to the patient during their visit and any new prescriptions provided to the patient are listed below.  Medications given during this visit Medications  HYDROmorphone (DILAUDID) injection 2 mg (2 mg Intravenous Given 04/10/23 0444)  ondansetron (ZOFRAN) injection 4 mg (4 mg Intravenous Given 04/10/23 0443)     The patient appears reasonably screen and/or stabilized for discharge and I doubt any other medical condition or other [facility] requiring further screening, evaluation, or treatment in the ED at this time prior to discharge.           Final Clinical Impression(s) / ED Diagnoses Final diagnoses:  Generalized abdominal pain    Rx / DC Orders ED Discharge Orders     None          Melene Plan, DO 04/10/23 1610

## 2023-04-10 NOTE — Telephone Encounter (Signed)
 TCT patient regarding the possibility of a clinical trial @ [facility] with Dr. Simmie Davies. Spoke with her. Informed her of a possible clinical trial @ [facility] She is agreeable to this. Fax'd referral to [facility] -Gi dept. Attn Dr. Simmie Davies fax@# [phone] P#  of new pt coordinator [phone], option 1 They should be contacting pt within 24 hours.

## 2023-04-10 NOTE — Progress Notes (Signed)
 Palliative Medicine [facility]  [phone] [phone]   Name: Kaitlin Ferrell Date: 04/10/2023 MRN: [id number]  DOB: [birth year]  Patient Care Team: Bernadette Hoit, MD as PCP - General (Family Medicine) Pickenpack-Cousar, Arty Baumgartner, NP as Nurse Practitioner (Nurse Practitioner) Jaci Standard, MD as Consulting Physician (Hematology and Oncology)   I connected with Thompson Caul on 04/10/23 at  8:30 AM EST by telephone and verified that I am speaking with the correct person using two identifiers.   I discussed the limitations, risks, security and privacy concerns of performing an evaluation and management service by telemedicine and the availability of in-person appointments. I also discussed with the patient that there may be a patient responsible charge related to this service. The patient expressed understanding and agreed to proceed.   Other persons participating in the visit and their role in the encounter: N/A   Patient's location: Home  Provider's location: [facility]    Chief Complaint: Pain    INTERVAL HISTORY: Kaitlin Ferrell is a 58 y.o. 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 Other history includes gestational trophoblastic disease, insomnia, HTN, and migraines. Palliative ask to see for symptom and pain management and goals of care.   SOCIAL HISTORY:     reports that she has never smoked. She has never used smokeless tobacco. She reports current alcohol use of about 1.0 standard drink of alcohol per week. She reports that she does not use drugs.  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  MEDICATIONS:  Current Outpatient Medications  Medication Sig Dispense Refill   Cyanocobalamin (VITAMIN B-12 CR PO) Take by mouth.     diphenoxylate-atropine (LOMOTIL) 2.5-0.025 MG tablet Take 1 tablet by mouth 4 (four) times daily as needed for diarrhea or loose stools. 30 tablet 0   eletriptan (RELPAX) 40 MG tablet Take 1 tablet (40 mg total) by mouth as needed for migraine or headache. May repeat in 2 hours if needed. 15 tablet 6   Folic Acid (FOLATE PO) Place 1 mg under the tongue daily.     HYDROmorphone (DILAUDID) 2 MG tablet Take 1 tablet (2 mg total) by mouth every 4 (four) hours as needed for severe pain (pain score 7-10). 60 tablet 0   lidocaine-prilocaine (EMLA) cream Apply 1 Application topically as needed. 30 g 2   LORazepam (ATIVAN) 0.5 MG tablet Take 0.5 mg by mouth as needed for anxiety.     magic mouthwash (multi-ingredient) oral suspension Swish and swallow 5 mLs by mouth 4 times daily as needed for throat discomfort 400 mL 1   magnesium oxide (MAG-OX) 400 (241.3 MG) MG tablet TAKE 1 TABLET BY MOUTH TWICE A DAY 60 tablet 6   morphine (MS CONTIN) 15 MG 12 hr tablet Take 1 tablet (15 mg total) by mouth every 8 (eight) hours. Take in addition to MS Contin 30  mg to total 45mg  every 8 hours. 90 tablet 0   morphine (MS CONTIN) 30 MG 12 hr tablet Take 1 tablet (30 mg total) by mouth every 8 (eight) hours. 90 tablet 0   OLANZapine (ZYPREXA) 10 MG tablet TAKE 1 TABLET BY MOUTH EVERYDAY AT BEDTIME 90 tablet 1   ondansetron (ZOFRAN) 8 MG tablet Take 1 tablet (8 mg total) by mouth every 8 (eight) hours as needed for nausea or vomiting. 90 tablet 0   ondansetron  (ZOFRAN-ODT) 8 MG disintegrating tablet Take 1 tablet (8 mg total) by mouth every 8 (eight) hours as needed for nausea or vomiting. 20 tablet 0   pantoprazole (PROTONIX) 40 MG tablet Take 40 mg by mouth every morning.     promethazine (PHENERGAN) 25 MG tablet Take 1 tablet (25 mg total) by mouth every 6 (six) hours as needed for nausea or vomiting. 30 tablet 0   scopolamine (TRANSDERM-SCOP) 1 MG/3DAYS Place 1 patch (1.5 mg total) onto the skin every 3 (three) days. 10 patch 12   YUVAFEM 10 MCG TABS vaginal tablet Place 1 tablet vaginally 2 (two) times a week. For 14 days, then insert vaginally twice weekly.     No current facility-administered medications for this visit.    VITAL SIGNS: LMP 06/26/2013 Comment: low dose BCP There were no vitals filed for this visit.  Estimated body mass index is 21.82 kg/m as calculated from the following:   Height as of 03/25/23: 5\' 2"  (1.575 m).   Weight as of 04/07/23: 119 lb 4.8 oz (54.1 kg).   PERFORMANCE STATUS (ECOG) : 1 - Symptomatic but completely ambulatory  Discussed the use of AI scribe software for clinical note transcription with the patient, who gave verbal consent to proceed.  IMPRESSION:  I connected with Kaitlin Ferrell "Kaitlin Ferrell" for symptom management follow-up. Unfortunately she has been having worsening abdominal pain despite current pain regimen. Denies nausea, vomiting, constipation, or diarrhea. Ongoing fatigue. Appetite fluctuates. Some days are better than others. Mainly impacted by level of pain.   We discussed her pain at length. She is reporting worsening abdominal pain. The pain is constant, sharp, throbbing, and aching. Radiates across entire stomach. She shares that pain was so severe over the weekend to the point she would have to "crawl" in order to move. She was seen in the ED and given IV hydromorphone, IVF, and Zofran. Reports the pain did not completely resolve however it decreased allowing it to be more manageable.  We reviewed her home regimen which includes MS Contin 45mg  every 8 hours and MS IR 15mg  every 4 hours as needed. Kaitlin Ferrell states this regimen has not provided her with much relief compared to previous weeks. I discussed adjustments to her medications as she desires better pain management to allow her some improvement in her quality of life. Education provided on the use of hydromorphone for breakthrough pain. The patient verbalized understanding. We will continue to closely monitor and adjust as needed. Will consider transitioning to Fentanyl patch in the upcoming days as appropriate.    Goals of Care   We discussed her current illness and what it means in the larger context of her on-going co-morbidities. Natural disease trajectory and expectations were discussed. Mrs. Keisler is realistic in her current illness. She is aware that her cancer continues to progress and all treatments have been maximized. Her quality of life is most important to her. She wishes to continue to treat the treatable allowing her every opportunity to continue thriving  for as long as she can while aggressively managing her symptoms to minimize discomfort, pain, or suffering. She is taking things one day at a time.    07/07/22- Patient and husband are realistic in their understanding of her cancer diagnosis and trajectory. She shares they have also discussed at length with her children. Patient states she chose to pursue chemotherapy break due to significance of her symptoms. She was extremely fatigue, weak, could not get out of bed, no appetite, weight loss, nausea, vomiting, and diarrhea. [name] shares she was unable to perform ADLs and husband was having to bathe her. They were concerned that she was facing end-of-life given how poor her quality of life was. Patient and husband speaks to appreciation of her improvement in quality of life over the past several weeks.    I created space and opportunity for patient and husband to  discuss goals and hopes. Patient is trying to focus on healthy food options and be as active as she can. They speak to plans of watchful waiting to see what upcoming CT scan results show in terms of her cancer. She will then consider if she would like to pursue additional treatment options versus not. They are clear in their understanding what pros and cons are of pursing or not to pursue further treatment including end-of-life. Kaitlin Ferrell speaks to her quality of life is most important over quantity. She would not want to spend last moments in a stupor suffering state. Emotional support provided.   We discussed Her current illness and what it means in the larger context of Her on-going co-morbidities. Natural disease trajectory and expectations were discussed.  I discussed the importance of continued conversation with family and their medical providers regarding overall plan of care and treatment options, ensuring decisions are within the context of the patients values and GOCs.  Assessment and Plan  Cancer Related Pain Patient is complaining of worsening abdominal pain causing her to be debilitated at times. Current regimen is not providing relief as it has in the past. Recent ED visit for pain control.  -Continue MS Contin 45mg  every 8 hours. Will consider transitioning to Fentanyl patch if pain remains uncontrolled. -Discontinue use of MS IR.  -Hydromorphone 2mg  every 4 hours as needed.  -We will continue to closely monitor and adjust regiment as needed.   I will plan to see back on March 18th. Will contact by phone later this week for close symptom management. Sooner if needed.  Patient expressed understanding and was in agreement with this plan. She also understands that She can call the clinic at any time with any questions, concerns, or complaints.   Any controlled substances utilized were prescribed in the context of palliative care. PDMP has been reviewed.   Visit consisted of counseling  and education dealing with the complex and emotionally intense issues of symptom management and palliative care in the setting of serious and potentially life-threatening illness.  Willette Alma, AGPCNP-BC  Palliative Medicine Team/[facility]

## 2023-04-10 NOTE — Discharge Instructions (Signed)
 Hopefully we got in front of your pain.  Please return for worsening symptoms.  Return for inability eat or drink.

## 2023-04-11 ENCOUNTER — Telehealth: Payer: Self-pay

## 2023-04-11 ENCOUNTER — Other Ambulatory Visit: Payer: Self-pay | Admitting: Nurse Practitioner

## 2023-04-11 ENCOUNTER — Ambulatory Visit (INDEPENDENT_AMBULATORY_CARE_PROVIDER_SITE_OTHER): Admitting: Family Medicine

## 2023-04-11 ENCOUNTER — Encounter: Payer: Self-pay | Admitting: Family Medicine

## 2023-04-11 VITALS — BP 120/60 | HR 90 | Temp 98.6°F | Ht 62.0 in | Wt 116.2 lb

## 2023-04-11 DIAGNOSIS — F4322 Adjustment disorder with anxiety: Secondary | ICD-10-CM | POA: Diagnosis not present

## 2023-04-11 DIAGNOSIS — C259 Malignant neoplasm of pancreas, unspecified: Secondary | ICD-10-CM

## 2023-04-11 DIAGNOSIS — R109 Unspecified abdominal pain: Secondary | ICD-10-CM | POA: Insufficient documentation

## 2023-04-11 DIAGNOSIS — G4709 Other insomnia: Secondary | ICD-10-CM | POA: Diagnosis not present

## 2023-04-11 DIAGNOSIS — K5900 Constipation, unspecified: Secondary | ICD-10-CM | POA: Insufficient documentation

## 2023-04-11 DIAGNOSIS — C787 Secondary malignant neoplasm of liver and intrahepatic bile duct: Secondary | ICD-10-CM

## 2023-04-11 DIAGNOSIS — Z515 Encounter for palliative care: Secondary | ICD-10-CM

## 2023-04-11 DIAGNOSIS — M792 Neuralgia and neuritis, unspecified: Secondary | ICD-10-CM

## 2023-04-11 DIAGNOSIS — G893 Neoplasm related pain (acute) (chronic): Secondary | ICD-10-CM

## 2023-04-11 MED ORDER — LORAZEPAM 1 MG PO TABS: 1.0000 mg | ORAL_TABLET | Freq: Three times a day (TID) | ORAL | 0 refills | Status: DC

## 2023-04-11 MED ORDER — GABAPENTIN 300 MG PO CAPS: ORAL_CAPSULE | ORAL | 1 refills | Status: DC

## 2023-04-11 MED ORDER — CITALOPRAM HYDROBROMIDE 10 MG PO TABS
10.0000 mg | ORAL_TABLET | Freq: Every day | ORAL | 1 refills | Status: DC
Start: 2023-04-11 — End: 2023-04-19

## 2023-04-11 NOTE — Progress Notes (Signed)
 Patient Office Visit  Assessment & Plan:  Abdominal pain, unspecified abdominal location  Pancreatic carcinoma metastatic to liver [facility])  Pancreatic adenocarcinoma (HCC)  Adjustment disorder with anxiety -     Citalopram Hydrobromide; Take 1 tablet (10 mg total) by mouth daily.  Dispense: 90 tablet; Refill: 1 -     LORazepam; Take 1 tablet (1 mg total) by mouth every 8 (eight) hours.  Dispense: 30 tablet; Refill: 0  Other insomnia  Constipation, unspecified constipation type   Test results were reviewed and analyzed as part of the medical decision making of this visit.  Reviewed ER notes, lab work that was done yesterday.  Also reviewed oncology notes during the office visit today. Start Celexa 10 mg once a day.  Increase Ativan to 1 mg every 8 hours.  Recommended that she seek behavioral health/psych psychology support at the oncology center in [place].  Return in 3 to 4 weeks or sooner if necessary.  Patient will do the fleets enema over-the-counter if she does not have a bowel movement today recommended she go to the ED.  Patient is aware that bowel obstruction is a concern given chronic pain meds. No follow-ups on file.   Subjective:    Patient ID: Kaitlin Ferrell, female    DOB: Jan 13, 1966  Age: 58 y.o. MRN: [id number]  No chief complaint on file.   HPI Worsening Abdominal pain with constipation- Pt now taking Morphine 45 mg every 8 hrs but was told today by oncology that she needs to increase Morphine to 60mg  every 8 hrs, Dilaudid 2mg  to increase to 4mg  every 4 hours if necessary (back up pain med).  Patient was in the ER yesterday and was given Dilaudid IV with Zofran and lab work was done.  No imaging was done. Not having nausea or vomiting/fever/chills. Pt does not have appetite right now. Pt is trying to drink more water.  Constipation- last BM was 3 days ago. Pt used Dulcolax suppository this AM but did not help, did not have BM.  Pt has not used Sennokot or  fleets enema. Pt has not been able to eat due to decreased appetite.  Has had to use fleets enema in the past which was effective.  Patient has not done this today. Pancreatic CA Stage IV- pt was told last week that chemo was not working and that she had 6 mos to live per Oncology.  Patient has not fully processed this and has not told her children.  Husband is very upset about this.  Patient does have a good support system with her husband and close friends. Chronic pain- pt is always in pain but meds help her a little bit but is never pain free.  Insomnia-patient does take the Zyprexa but not every night but will start doing so.  Patient also takes Zyprexa for nausea.  Patient states that she has insomnia primarily because of anxiety and also ongoing abdominal pain. Anxiety-patient was given a prescription for Celexa last year but did not take it.  Patient takes Ativan 0.5 mg as needed but does not help.  Patient has not tried to increase it to 1 mg but will do so.  Patient does not know what to think or feel at this time knowing that the prognosis is poor.  Oncology at [facility] is sending information to [facility] to see if she qualifies for an ongoing clinical trial.  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   treated with 4 cycles of actinomycin D from 12-02-11 thru 01-13-12   H/O molar pregnancy, antepartum    Hypertension    Migraine    Miscarriage    Past Surgical History:  Procedure Laterality Date   BREAST BIOPSY  2001   Left   BREAST LUMPECTOMY     DILATION AND CURETTAGE OF UTERUS  1992, 09/2011   DILATION AND EVACUATION  2011   INSERTION OF MESH N/A 09/03/2013   Procedure: INSERTION OF MESH;  Surgeon: Ardeth Sportsman, MD;  Location: [facility];   Service: General;  Laterality: N/A;   IR CHEST FLUORO  04/14/2022   IR IMAGING GUIDED PORT INSERTION  03/22/2022   IR IMAGING GUIDED PORT INSERTION  05/02/2022   IR PORT REPAIR CENTRAL VENOUS ACCESS DEVICE  04/19/2022   IR REMOVAL TUN CV CATH W/O FL  05/02/2022   IR US LIVER BIOPSY  03/22/2022   PORTACATH PLACEMENT  11/2011   PORTACATH PLACEMENT     REMOVAL  MARCH 2014   VENTRAL HERNIA REPAIR N/A 09/03/2013   Procedure: LAPAROSCOPIC VENTRAL WALL HERNIA REPAIR;  Surgeon: Ardeth Sportsman, MD;  Location: [facility];  Service: General;  Laterality: N/A;   WISDOM TOOTH EXTRACTION     in 11th grade   Social History   Tobacco Use   Smoking status: Never   Smokeless tobacco: Never  Substance Use Topics   Alcohol use: Yes    Alcohol/week: 1.0 standard drink of alcohol    Types: 1 Glasses of wine per week    Comment: occas   Drug use: No   Family History  Adopted: Yes  Problem Relation Age of Onset   Breast cancer Mother        dx > 50   Heart attack Mother    Diabetes Father    Breast cancer Maternal Grandmother        ? < 50   Stroke Paternal Grandmother    Breast cancer Maternal Aunt        dx > 50   Breast cancer Maternal Aunt        dx > 50   Pancreatic cancer Maternal Uncle    Pancreatic cancer Cousin    Allergies  Allergen Reactions   Compazine [Prochlorperazine Edisylate]     Very agitated   Propofol Other (See Comments)    Headache   Oxycodone-Acetaminophen    Prochlorperazine    Labetalol Hives, Itching, Swelling and Rash    ROS    Objective:    BP 120/60   Pulse 90   Temp 98.6 F (37 C)   Ht 5\' 2"  (1.575 m)   Wt 116 lb 4 oz (52.7 kg)   LMP 06/26/2013 Comment: low dose BCP  SpO2 99%   BMI 21.26 kg/m  BP Readings from Last 3 Encounters:  04/11/23 120/60  04/10/23 (!) 158/86  04/07/23 135/72   Wt Readings from Last 3 Encounters:  04/11/23 116 lb 4 oz (52.7 kg)  04/07/23 119 lb 4.8 oz (54.1 kg)  03/25/23 119 lb (54 kg)    Physical Exam Vitals and  nursing note reviewed.  Constitutional:      Appearance: Normal appearance.  HENT:     Head: Normocephalic.     Right Ear: Tympanic membrane, ear canal and  external ear normal.     Left Ear: Tympanic membrane, ear canal and external ear normal.  Eyes:     Extraocular Movements: Extraocular movements intact.     Conjunctiva/sclera: Conjunctivae normal.     Pupils: Pupils are equal, round, and reactive to light.  Cardiovascular:     Rate and Rhythm: Normal rate and regular rhythm.     Heart sounds: Normal heart sounds.  Pulmonary:     Effort: Pulmonary effort is normal.     Breath sounds: Normal breath sounds.  Abdominal:     General: Bowel sounds are normal.     Tenderness: There is abdominal tenderness in the right lower quadrant and periumbilical area. There is guarding.  Musculoskeletal:     Right lower leg: No edema.     Left lower leg: No edema.  Neurological:     General: No focal deficit present.     Mental Status: She is alert and oriented to person, place, and time.  Psychiatric:        Attention and Perception: Attention normal.        Mood and Affect: Mood normal. Affect is tearful.        Behavior: Behavior normal.        Thought Content: Thought content normal.        Judgment: Judgment normal.      No results found for any visits on 04/11/23.

## 2023-04-11 NOTE — Telephone Encounter (Signed)
 Pt called reporting no relief from pain with her dilaudid, per Lowella Bandy, NP, pt may take 2-4mg  of dilaudid every 4 hours as needed for pain as well as to increase her MS Contin to 60mg  every 8 hours. Pt verbalized understanding of medication changes, no further needs at this time. Scheduled a hone follow up.

## 2023-04-12 ENCOUNTER — Encounter: Payer: Self-pay | Admitting: Family Medicine

## 2023-04-13 ENCOUNTER — Inpatient Hospital Stay

## 2023-04-13 ENCOUNTER — Other Ambulatory Visit: Payer: Self-pay | Admitting: Nurse Practitioner

## 2023-04-13 ENCOUNTER — Other Ambulatory Visit: Payer: Self-pay

## 2023-04-13 ENCOUNTER — Other Ambulatory Visit: Payer: Self-pay | Admitting: *Deleted

## 2023-04-13 DIAGNOSIS — G893 Neoplasm related pain (acute) (chronic): Secondary | ICD-10-CM | POA: Diagnosis not present

## 2023-04-13 DIAGNOSIS — Z6821 Body mass index (BMI) 21.0-21.9, adult: Secondary | ICD-10-CM | POA: Diagnosis not present

## 2023-04-13 DIAGNOSIS — D61818 Other pancytopenia: Secondary | ICD-10-CM | POA: Diagnosis not present

## 2023-04-13 DIAGNOSIS — R64 Cachexia: Secondary | ICD-10-CM | POA: Diagnosis not present

## 2023-04-13 DIAGNOSIS — Z8249 Family history of ischemic heart disease and other diseases of the circulatory system: Secondary | ICD-10-CM | POA: Diagnosis not present

## 2023-04-13 DIAGNOSIS — R1084 Generalized abdominal pain: Secondary | ICD-10-CM | POA: Diagnosis not present

## 2023-04-13 DIAGNOSIS — N135 Crossing vessel and stricture of ureter without hydronephrosis: Secondary | ICD-10-CM | POA: Diagnosis not present

## 2023-04-13 DIAGNOSIS — K5903 Drug induced constipation: Secondary | ICD-10-CM | POA: Diagnosis not present

## 2023-04-13 DIAGNOSIS — C259 Malignant neoplasm of pancreas, unspecified: Secondary | ICD-10-CM

## 2023-04-13 DIAGNOSIS — E876 Hypokalemia: Secondary | ICD-10-CM | POA: Diagnosis not present

## 2023-04-13 DIAGNOSIS — C250 Malignant neoplasm of head of pancreas: Secondary | ICD-10-CM | POA: Diagnosis not present

## 2023-04-13 DIAGNOSIS — Z515 Encounter for palliative care: Secondary | ICD-10-CM

## 2023-04-13 DIAGNOSIS — C786 Secondary malignant neoplasm of retroperitoneum and peritoneum: Secondary | ICD-10-CM | POA: Diagnosis not present

## 2023-04-13 DIAGNOSIS — D6959 Other secondary thrombocytopenia: Secondary | ICD-10-CM | POA: Diagnosis not present

## 2023-04-13 DIAGNOSIS — Z884 Allergy status to anesthetic agent status: Secondary | ICD-10-CM | POA: Diagnosis not present

## 2023-04-13 DIAGNOSIS — R000 Tachycardia, unspecified: Secondary | ICD-10-CM | POA: Diagnosis not present

## 2023-04-13 DIAGNOSIS — C787 Secondary malignant neoplasm of liver and intrahepatic bile duct: Secondary | ICD-10-CM | POA: Diagnosis not present

## 2023-04-13 DIAGNOSIS — T451X5A Adverse effect of antineoplastic and immunosuppressive drugs, initial encounter: Secondary | ICD-10-CM | POA: Diagnosis present

## 2023-04-13 DIAGNOSIS — Z888 Allergy status to other drugs, medicaments and biological substances status: Secondary | ICD-10-CM | POA: Diagnosis not present

## 2023-04-13 DIAGNOSIS — Z86718 Personal history of other venous thrombosis and embolism: Secondary | ICD-10-CM | POA: Diagnosis not present

## 2023-04-13 DIAGNOSIS — D649 Anemia, unspecified: Secondary | ICD-10-CM | POA: Diagnosis not present

## 2023-04-13 DIAGNOSIS — K769 Liver disease, unspecified: Secondary | ICD-10-CM | POA: Diagnosis not present

## 2023-04-13 DIAGNOSIS — M545 Low back pain, unspecified: Secondary | ICD-10-CM | POA: Diagnosis present

## 2023-04-13 DIAGNOSIS — N6019 Diffuse cystic mastopathy of unspecified breast: Secondary | ICD-10-CM | POA: Diagnosis present

## 2023-04-13 DIAGNOSIS — R590 Localized enlarged lymph nodes: Secondary | ICD-10-CM | POA: Diagnosis not present

## 2023-04-13 DIAGNOSIS — G43909 Migraine, unspecified, not intractable, without status migrainosus: Secondary | ICD-10-CM | POA: Diagnosis present

## 2023-04-13 DIAGNOSIS — Z7189 Other specified counseling: Secondary | ICD-10-CM | POA: Diagnosis not present

## 2023-04-13 DIAGNOSIS — K219 Gastro-esophageal reflux disease without esophagitis: Secondary | ICD-10-CM | POA: Diagnosis not present

## 2023-04-13 DIAGNOSIS — I10 Essential (primary) hypertension: Secondary | ICD-10-CM | POA: Diagnosis not present

## 2023-04-13 DIAGNOSIS — Z885 Allergy status to narcotic agent status: Secondary | ICD-10-CM | POA: Diagnosis not present

## 2023-04-13 DIAGNOSIS — N1330 Unspecified hydronephrosis: Secondary | ICD-10-CM | POA: Diagnosis not present

## 2023-04-13 DIAGNOSIS — R109 Unspecified abdominal pain: Secondary | ICD-10-CM | POA: Diagnosis not present

## 2023-04-13 DIAGNOSIS — K6289 Other specified diseases of anus and rectum: Secondary | ICD-10-CM | POA: Diagnosis not present

## 2023-04-13 DIAGNOSIS — K8689 Other specified diseases of pancreas: Secondary | ICD-10-CM | POA: Diagnosis not present

## 2023-04-13 DIAGNOSIS — K5900 Constipation, unspecified: Secondary | ICD-10-CM | POA: Diagnosis not present

## 2023-04-13 DIAGNOSIS — D696 Thrombocytopenia, unspecified: Secondary | ICD-10-CM | POA: Diagnosis not present

## 2023-04-13 DIAGNOSIS — Z800 Family history of malignant neoplasm of digestive organs: Secondary | ICD-10-CM | POA: Diagnosis not present

## 2023-04-13 DIAGNOSIS — C799 Secondary malignant neoplasm of unspecified site: Secondary | ICD-10-CM | POA: Diagnosis not present

## 2023-04-13 LAB — URINALYSIS, COMPLETE (UACMP) WITH MICROSCOPIC
Bacteria, UA: NONE SEEN
Bilirubin Urine: NEGATIVE
Glucose, UA: NEGATIVE mg/dL
Hgb urine dipstick: NEGATIVE
Ketones, ur: NEGATIVE mg/dL
Leukocytes,Ua: NEGATIVE
Nitrite: NEGATIVE
Protein, ur: NEGATIVE mg/dL
Specific Gravity, Urine: 1.012 (ref 1.005–1.030)
pH: 5 (ref 5.0–8.0)

## 2023-04-13 MED ORDER — MORPHINE SULFATE ER 15 MG PO TBCR: 15.0000 mg | EXTENDED_RELEASE_TABLET | Freq: Three times a day (TID) | ORAL | Status: DC

## 2023-04-13 MED ORDER — MORPHINE SULFATE ER 30 MG PO TBCR: 30.0000 mg | EXTENDED_RELEASE_TABLET | Freq: Three times a day (TID) | ORAL | Status: DC

## 2023-04-13 MED ORDER — FENTANYL 50 MCG/HR TD PT72: 1.0000 | MEDICATED_PATCH | TRANSDERMAL | 0 refills | Status: DC

## 2023-04-13 MED ORDER — NALOXEGOL OXALATE 12.5 MG PO TABS: 12.5000 mg | ORAL_TABLET | Freq: Every day | ORAL | 0 refills | Status: DC

## 2023-04-13 NOTE — Progress Notes (Signed)
 Pt called c/o severe pain, pt c/o no bowel movement in 5 days. Pt educated on opioid induced constipation as well as pain associated with that. Pt instructed to take mag citrate as well as a stool softener. Pt educated on how to take these medications, phone visit scheduled for tomorrow to check in. Pt verbalized understanding.

## 2023-04-14 ENCOUNTER — Emergency Department (HOSPITAL_COMMUNITY)

## 2023-04-14 ENCOUNTER — Other Ambulatory Visit: Payer: Self-pay

## 2023-04-14 ENCOUNTER — Encounter (HOSPITAL_COMMUNITY): Payer: Self-pay | Admitting: Emergency Medicine

## 2023-04-14 ENCOUNTER — Inpatient Hospital Stay (HOSPITAL_COMMUNITY)
Admission: EM | Admit: 2023-04-14 | Discharge: 2023-04-19 | DRG: 694 | Disposition: A | Discharge status: Home / Self Care | Attending: Internal Medicine | Admitting: Internal Medicine

## 2023-04-14 ENCOUNTER — Encounter

## 2023-04-14 DIAGNOSIS — C250 Malignant neoplasm of head of pancreas: Secondary | ICD-10-CM | POA: Diagnosis present

## 2023-04-14 DIAGNOSIS — Z8249 Family history of ischemic heart disease and other diseases of the circulatory system: Secondary | ICD-10-CM

## 2023-04-14 DIAGNOSIS — K6289 Other specified diseases of anus and rectum: Secondary | ICD-10-CM | POA: Diagnosis present

## 2023-04-14 DIAGNOSIS — G43909 Migraine, unspecified, not intractable, without status migrainosus: Secondary | ICD-10-CM | POA: Diagnosis present

## 2023-04-14 DIAGNOSIS — C786 Secondary malignant neoplasm of retroperitoneum and peritoneum: Secondary | ICD-10-CM | POA: Diagnosis present

## 2023-04-14 DIAGNOSIS — Z79899 Other long term (current) drug therapy: Secondary | ICD-10-CM

## 2023-04-14 DIAGNOSIS — D649 Anemia, unspecified: Secondary | ICD-10-CM | POA: Insufficient documentation

## 2023-04-14 DIAGNOSIS — I10 Essential (primary) hypertension: Secondary | ICD-10-CM | POA: Diagnosis present

## 2023-04-14 DIAGNOSIS — G893 Neoplasm related pain (acute) (chronic): Secondary | ICD-10-CM | POA: Diagnosis present

## 2023-04-14 DIAGNOSIS — N1330 Unspecified hydronephrosis: Principal | ICD-10-CM | POA: Diagnosis present

## 2023-04-14 DIAGNOSIS — T451X5A Adverse effect of antineoplastic and immunosuppressive drugs, initial encounter: Secondary | ICD-10-CM | POA: Diagnosis present

## 2023-04-14 DIAGNOSIS — K5900 Constipation, unspecified: Secondary | ICD-10-CM | POA: Diagnosis present

## 2023-04-14 DIAGNOSIS — R109 Unspecified abdominal pain: Secondary | ICD-10-CM | POA: Diagnosis present

## 2023-04-14 DIAGNOSIS — D61818 Other pancytopenia: Secondary | ICD-10-CM | POA: Diagnosis present

## 2023-04-14 DIAGNOSIS — Z888 Allergy status to other drugs, medicaments and biological substances status: Secondary | ICD-10-CM

## 2023-04-14 DIAGNOSIS — Z86718 Personal history of other venous thrombosis and embolism: Secondary | ICD-10-CM

## 2023-04-14 DIAGNOSIS — K219 Gastro-esophageal reflux disease without esophagitis: Secondary | ICD-10-CM | POA: Diagnosis present

## 2023-04-14 DIAGNOSIS — Z7189 Other specified counseling: Secondary | ICD-10-CM

## 2023-04-14 DIAGNOSIS — C799 Secondary malignant neoplasm of unspecified site: Secondary | ICD-10-CM | POA: Diagnosis not present

## 2023-04-14 DIAGNOSIS — D696 Thrombocytopenia, unspecified: Secondary | ICD-10-CM | POA: Diagnosis not present

## 2023-04-14 DIAGNOSIS — Z6821 Body mass index (BMI) 21.0-21.9, adult: Secondary | ICD-10-CM | POA: Diagnosis not present

## 2023-04-14 DIAGNOSIS — Z515 Encounter for palliative care: Secondary | ICD-10-CM | POA: Diagnosis not present

## 2023-04-14 DIAGNOSIS — Z800 Family history of malignant neoplasm of digestive organs: Secondary | ICD-10-CM

## 2023-04-14 DIAGNOSIS — D6959 Other secondary thrombocytopenia: Secondary | ICD-10-CM | POA: Diagnosis present

## 2023-04-14 DIAGNOSIS — R64 Cachexia: Secondary | ICD-10-CM | POA: Diagnosis present

## 2023-04-14 DIAGNOSIS — E876 Hypokalemia: Secondary | ICD-10-CM | POA: Diagnosis present

## 2023-04-14 DIAGNOSIS — M545 Low back pain, unspecified: Secondary | ICD-10-CM | POA: Diagnosis present

## 2023-04-14 DIAGNOSIS — Z885 Allergy status to narcotic agent status: Secondary | ICD-10-CM | POA: Diagnosis not present

## 2023-04-14 DIAGNOSIS — Z79891 Long term (current) use of opiate analgesic: Secondary | ICD-10-CM

## 2023-04-14 DIAGNOSIS — K5903 Drug induced constipation: Secondary | ICD-10-CM | POA: Diagnosis not present

## 2023-04-14 DIAGNOSIS — C787 Secondary malignant neoplasm of liver and intrahepatic bile duct: Secondary | ICD-10-CM | POA: Diagnosis present

## 2023-04-14 DIAGNOSIS — C259 Malignant neoplasm of pancreas, unspecified: Secondary | ICD-10-CM | POA: Diagnosis present

## 2023-04-14 DIAGNOSIS — Z884 Allergy status to anesthetic agent status: Secondary | ICD-10-CM | POA: Diagnosis not present

## 2023-04-14 DIAGNOSIS — N6019 Diffuse cystic mastopathy of unspecified breast: Secondary | ICD-10-CM | POA: Diagnosis present

## 2023-04-14 LAB — URINALYSIS, ROUTINE W REFLEX MICROSCOPIC
Bacteria, UA: NONE SEEN
Bilirubin Urine: NEGATIVE
Glucose, UA: NEGATIVE mg/dL
Ketones, ur: 20 mg/dL — AB
Leukocytes,Ua: NEGATIVE
Nitrite: NEGATIVE
Protein, ur: NEGATIVE mg/dL
Specific Gravity, Urine: 1.011 (ref 1.005–1.030)
pH: 5 (ref 5.0–8.0)

## 2023-04-14 LAB — CBC
HCT: 34.7 % — ABNORMAL LOW (ref 36.0–46.0)
Hemoglobin: 10.4 g/dL — ABNORMAL LOW (ref 12.0–15.0)
MCH: 27.7 pg (ref 26.0–34.0)
MCHC: 30 g/dL (ref 30.0–36.0)
MCV: 92.3 fL (ref 80.0–100.0)
Platelets: 153 10*3/uL (ref 150–400)
RBC: 3.76 MIL/uL — ABNORMAL LOW (ref 3.87–5.11)
RDW: 16.7 % — ABNORMAL HIGH (ref 11.5–15.5)
WBC: 5.6 10*3/uL (ref 4.0–10.5)
nRBC: 0 % (ref 0.0–0.2)

## 2023-04-14 LAB — COMPREHENSIVE METABOLIC PANEL
ALT: 9 U/L (ref 0–44)
AST: 18 U/L (ref 15–41)
Albumin: 4 g/dL (ref 3.5–5.0)
Alkaline Phosphatase: 92 U/L (ref 38–126)
Anion gap: 11 (ref 5–15)
BUN: 11 mg/dL (ref 6–20)
CO2: 25 mmol/L (ref 22–32)
Calcium: 9.1 mg/dL (ref 8.9–10.3)
Chloride: 100 mmol/L (ref 98–111)
Creatinine, Ser: 1.1 mg/dL — ABNORMAL HIGH (ref 0.44–1.00)
GFR, Estimated: 59 mL/min — ABNORMAL LOW (ref >60)
Glucose, Bld: 105 mg/dL — ABNORMAL HIGH (ref 70–99)
Potassium: 3.1 mmol/L — ABNORMAL LOW (ref 3.5–5.1)
Sodium: 136 mmol/L (ref 135–145)
Total Bilirubin: 0.7 mg/dL (ref 0.0–1.2)
Total Protein: 8.1 g/dL (ref 6.5–8.1)

## 2023-04-14 LAB — MAGNESIUM: Magnesium: 2.5 mg/dL — ABNORMAL HIGH (ref 1.7–2.4)

## 2023-04-14 LAB — PHOSPHORUS: Phosphorus: 3.3 mg/dL (ref 2.5–4.6)

## 2023-04-14 LAB — LIPASE, BLOOD: Lipase: 21 U/L (ref 11–51)

## 2023-04-14 MED ORDER — IOHEXOL 300 MG/ML  SOLN
100.0000 mL | Freq: Once | INTRAMUSCULAR | Status: AC | PRN
  Administered 2023-04-14: 100 mL via INTRAVENOUS

## 2023-04-14 MED ORDER — MORPHINE SULFATE (PF) 4 MG/ML IV SOLN
4.0000 mg | Freq: Once | INTRAVENOUS | Status: AC
  Administered 2023-04-14: 4 mg via INTRAVENOUS
  Filled 2023-04-14: qty 1

## 2023-04-14 MED ORDER — SODIUM CHLORIDE 0.9% FLUSH
10.0000 mL | INTRAVENOUS | Status: DC | PRN
  Administered 2023-04-17: 10 mL

## 2023-04-14 MED ORDER — MORPHINE SULFATE ER 30 MG PO TBCR
45.0000 mg | EXTENDED_RELEASE_TABLET | Freq: Three times a day (TID) | ORAL | Status: DC
  Administered 2023-04-14 – 2023-04-15 (×2): 45 mg via ORAL
  Filled 2023-04-14 (×2): qty 1

## 2023-04-14 MED ORDER — ONDANSETRON 4 MG PO TBDP: 4.0000 mg | ORAL_TABLET | Freq: Once | ORAL | Status: DC | PRN

## 2023-04-14 MED ORDER — HYDROMORPHONE HCL 1 MG/ML IJ SOLN: 1.0000 mg | INTRAMUSCULAR | Status: DC | PRN

## 2023-04-14 MED ORDER — ONDANSETRON HCL 4 MG/2ML IJ SOLN: 4.0000 mg | Freq: Four times a day (QID) | INTRAMUSCULAR | Status: DC | PRN

## 2023-04-14 MED ORDER — SODIUM CHLORIDE 0.9% FLUSH
10.0000 mL | Freq: Two times a day (BID) | INTRAVENOUS | Status: DC
  Administered 2023-04-14 – 2023-04-19 (×6): 10 mL

## 2023-04-14 MED ORDER — SODIUM CHLORIDE 0.9 % IV SOLN
2.0000 g | INTRAVENOUS | Status: DC
  Administered 2023-04-15 – 2023-04-18 (×4): 2 g via INTRAVENOUS
  Filled 2023-04-14 (×3): qty 20

## 2023-04-14 MED ORDER — ACETAMINOPHEN 325 MG PO TABS
650.0000 mg | ORAL_TABLET | Freq: Four times a day (QID) | ORAL | Status: DC | PRN
  Administered 2023-04-15: 650 mg via ORAL
  Filled 2023-04-14: qty 2

## 2023-04-14 MED ORDER — CARMEX CLASSIC LIP BALM EX OINT
TOPICAL_OINTMENT | CUTANEOUS | Status: DC | PRN
  Filled 2023-04-14 (×2): qty 10

## 2023-04-14 MED ORDER — MORPHINE SULFATE ER 30 MG PO TBCR: 30.0000 mg | EXTENDED_RELEASE_TABLET | Freq: Three times a day (TID) | ORAL | Status: DC

## 2023-04-14 MED ORDER — SODIUM CHLORIDE 0.9 % IV SOLN
2.0000 g | Freq: Once | INTRAVENOUS | Status: AC
  Administered 2023-04-14: 2 g via INTRAVENOUS
  Filled 2023-04-14 (×2): qty 20

## 2023-04-14 MED ORDER — PANTOPRAZOLE SODIUM 40 MG IV SOLR
40.0000 mg | Freq: Once | INTRAVENOUS | Status: AC
  Administered 2023-04-14: 40 mg via INTRAVENOUS
  Filled 2023-04-14: qty 10

## 2023-04-14 MED ORDER — POTASSIUM CHLORIDE 10 MEQ/100ML IV SOLN
10.0000 meq | INTRAVENOUS | Status: AC
Start: 2023-04-14 — End: 2023-04-14
  Administered 2023-04-14 (×2): 10 meq via INTRAVENOUS
  Filled 2023-04-14 (×2): qty 100

## 2023-04-14 MED ORDER — POTASSIUM CHLORIDE CRYS ER 20 MEQ PO TBCR
40.0000 meq | EXTENDED_RELEASE_TABLET | Freq: Once | ORAL | Status: AC
  Administered 2023-04-14: 40 meq via ORAL
  Filled 2023-04-14: qty 2

## 2023-04-14 MED ORDER — ONDANSETRON HCL 4 MG PO TABS: 4.0000 mg | ORAL_TABLET | Freq: Four times a day (QID) | ORAL | Status: DC | PRN

## 2023-04-14 MED ORDER — CHLORHEXIDINE GLUCONATE CLOTH 2 % EX PADS
6.0000 | MEDICATED_PAD | Freq: Every day | CUTANEOUS | Status: DC
  Administered 2023-04-14 – 2023-04-19 (×6): 6 via TOPICAL

## 2023-04-14 MED ORDER — MORPHINE SULFATE ER 15 MG PO TBCR: 15.0000 mg | EXTENDED_RELEASE_TABLET | Freq: Three times a day (TID) | ORAL | Status: DC

## 2023-04-14 MED ORDER — HYDROMORPHONE HCL 1 MG/ML IJ SOLN
1.0000 mg | INTRAMUSCULAR | Status: DC | PRN
  Administered 2023-04-14: 1 mg via INTRAVENOUS
  Filled 2023-04-14: qty 1

## 2023-04-14 MED ORDER — ACETAMINOPHEN 650 MG RE SUPP: 650.0000 mg | Freq: Four times a day (QID) | RECTAL | Status: DC | PRN

## 2023-04-14 MED ORDER — METRONIDAZOLE 500 MG/100ML IV SOLN
500.0000 mg | Freq: Once | INTRAVENOUS | Status: AC
  Administered 2023-04-14: 500 mg via INTRAVENOUS
  Filled 2023-04-14: qty 100

## 2023-04-14 MED ORDER — METRONIDAZOLE 500 MG/100ML IV SOLN
500.0000 mg | Freq: Two times a day (BID) | INTRAVENOUS | Status: AC
  Administered 2023-04-14 – 2023-04-19 (×10): 500 mg via INTRAVENOUS
  Filled 2023-04-14 (×10): qty 100

## 2023-04-14 MED ORDER — HYDROMORPHONE HCL 1 MG/ML IJ SOLN
1.0000 mg | INTRAMUSCULAR | Status: DC | PRN
  Administered 2023-04-15 – 2023-04-16 (×5): 1 mg via INTRAVENOUS
  Filled 2023-04-14 (×5): qty 1

## 2023-04-14 MED ORDER — DEXTROSE-SODIUM CHLORIDE 5-0.9 % IV SOLN: INTRAVENOUS | Status: DC

## 2023-04-14 NOTE — ED Provider Notes (Signed)
 [place] EMERGENCY DEPARTMENT AT [facility] Provider Note   CSN: [id number] Arrival date & time: 04/14/23  4098     History  Chief Complaint  Patient presents with   Abdominal Pain   Constipation    Kaitlin Ferrell is a 58 y.o. female.   Abdominal Pain Associated symptoms: constipation   Constipation Associated symptoms: abdominal pain      Patient has a history of hypertension molar pregnancy pancreatic cancer who presents ED for abdominal pain back pain and constipation.  Patient states she is no longer receiving chemotherapy.  Her pancreatic carcinoma has metastasized to the liver.  She is receiving palliative treatment at this time.  Patient states for the last week or so she has been having trouble with persistent pain in her abdomen as well as her back.  She also has not had a normal bowel movement for a week.  Patient states she is just mostly mucus.  She has tried laxatives without relief.  Patient states she saw her doctor on the fourth.  She did not have any laboratory testing or imaging testing however she had been in the emergency room the day prior for the same symptoms.  Home Medications Prior to Admission medications   Medication Sig Start Date End Date Taking? Authorizing Provider  citalopram (CELEXA) 10 MG tablet Take 1 tablet (10 mg total) by mouth daily. 04/11/23   Bernadette Hoit, MD  eletriptan (RELPAX) 40 MG tablet Take 1 tablet (40 mg total) by mouth as needed for migraine or headache. May repeat in 2 hours if needed. 09/22/14   Nilda Riggs, NP  HYDROmorphone (DILAUDID) 2 MG tablet Take 1 tablet (2 mg total) by mouth every 4 (four) hours as needed for severe pain (pain score 7-10). 04/10/23   Pickenpack-Cousar, Arty Baumgartner, NP  lidocaine-prilocaine (EMLA) cream Apply 1 Application topically as needed. 09/29/22   Pickenpack-Cousar, Arty Baumgartner, NP  LORazepam (ATIVAN) 0.5 MG tablet Take 0.5 mg by mouth as needed for anxiety. 03/22/22    [provider]  LORazepam (ATIVAN) 1 MG tablet Take 1 tablet (1 mg total) by mouth every 8 (eight) hours. 04/11/23   Bernadette Hoit, MD  magnesium oxide (MAG-OX) 400 (241.3 MG) MG tablet TAKE 1 TABLET BY MOUTH TWICE A DAY 11/16/14   Nilda Riggs, NP  morphine (MS CONTIN) 15 MG 12 hr tablet Take 1 tablet (15 mg total) by mouth every 8 (eight) hours. 04/13/23   Pickenpack-Cousar, Arty Baumgartner, NP  morphine (MS CONTIN) 30 MG 12 hr tablet Take 1 tablet (30 mg total) by mouth every 8 (eight) hours. 04/13/23   Pickenpack-Cousar, Arty Baumgartner, NP  naloxegol oxalate (MOVANTIK) 12.5 MG TABS tablet Take 1 tablet (12.5 mg total) by mouth daily. 04/13/23   Pickenpack-Cousar, Arty Baumgartner, NP  OLANZapine (ZYPREXA) 10 MG tablet TAKE 1 TABLET BY MOUTH EVERYDAY AT BEDTIME 04/20/22   Georga Kaufmann T, PA-C  ondansetron (ZOFRAN) 8 MG tablet Take 1 tablet (8 mg total) by mouth every 8 (eight) hours as needed for nausea or vomiting. 03/14/22   Georga Kaufmann T, PA-C  ondansetron (ZOFRAN-ODT) 8 MG disintegrating tablet Take 1 tablet (8 mg total) by mouth every 8 (eight) hours as needed for nausea or vomiting. 05/17/22   Walisiewicz, Kaitlyn E, PA-C  pantoprazole (PROTONIX) 40 MG tablet Take 40 mg by mouth every morning. 03/17/23   [provider]  promethazine (PHENERGAN) 25 MG tablet Take 1 tablet (25 mg total) by mouth every 6 (six) hours  as needed for nausea or vomiting. 05/04/22   Jaci Standard, MD  scopolamine (TRANSDERM-SCOP) 1 MG/3DAYS Place 1 patch (1.5 mg total) onto the skin every 3 (three) days. 07/22/22   Briant Cedar, PA-C  tinidazole (TINDAMAX) 500 MG tablet Take 1,000 mg by mouth daily. 04/07/23   [provider]  YUVAFEM 10 MCG TABS vaginal tablet Place 1 tablet vaginally 2 (two) times a week. For 14 days, then insert vaginally twice weekly. 03/03/23   [provider]      Allergies    Compazine [prochlorperazine edisylate], Propofol, Oxycodone-acetaminophen, Prochlorperazine, and  Labetalol    Review of Systems   Review of Systems  Gastrointestinal:  Positive for abdominal pain and constipation.    Physical Exam Updated Vital Signs BP (!) 141/88 (BP Location: Right Arm)   Pulse 98   Temp 98 F (36.7 C)   Resp 15   Ht 1.575 m (5\' 2" )   Wt 52.6 kg   LMP 06/26/2013 Comment: low dose BCP  SpO2 98%   BMI 21.22 kg/m  Physical Exam Vitals and nursing note reviewed.  Constitutional:      General: She is not in acute distress.    Appearance: She is well-developed.  HENT:     Head: Normocephalic and atraumatic.     Right Ear: External ear normal.     Left Ear: External ear normal.  Eyes:     General: No scleral icterus.       Right eye: No discharge.        Left eye: No discharge.     Conjunctiva/sclera: Conjunctivae normal.  Neck:     Trachea: No tracheal deviation.  Cardiovascular:     Rate and Rhythm: Normal rate and regular rhythm.  Pulmonary:     Effort: Pulmonary effort is normal. No respiratory distress.     Breath sounds: Normal breath sounds. No stridor. No wheezing or rales.  Abdominal:     General: Bowel sounds are normal. There is no distension.     Palpations: Abdomen is soft.     Tenderness: There is abdominal tenderness. There is no guarding or rebound.  Genitourinary:    Comments: No significant amount of stool in the rectal vault, no fecal impaction Musculoskeletal:        General: No tenderness or deformity.     Cervical back: Neck supple.  Skin:    General: Skin is warm and dry.     Findings: No rash.  Neurological:     General: No focal deficit present.     Mental Status: She is alert.     Cranial Nerves: No cranial nerve deficit, dysarthria or facial asymmetry.     Sensory: No sensory deficit.     Motor: No abnormal muscle tone or seizure activity.     Coordination: Coordination normal.  Psychiatric:        Mood and Affect: Mood normal.     ED Results / Procedures / Treatments   Labs (all labs ordered are listed,  but only abnormal results are displayed) Labs Reviewed  COMPREHENSIVE METABOLIC PANEL - Abnormal; Notable for the following components:      Result Value   Potassium 3.1 (*)    Glucose, Bld 105 (*)    Creatinine, Ser 1.10 (*)    GFR, Estimated 59 (*)    All other components within normal limits  CBC - Abnormal; Notable for the following components:   RBC 3.76 (*)    Hemoglobin 10.4 (*)  HCT 34.7 (*)    RDW 16.7 (*)    All other components within normal limits  URINALYSIS, ROUTINE W REFLEX MICROSCOPIC - Abnormal; Notable for the following components:   Hgb urine dipstick SMALL (*)    Ketones, ur 20 (*)    All other components within normal limits  LIPASE, BLOOD  MAGNESIUM  PHOSPHORUS    EKG EKG Interpretation Date/Time:  Friday April 14 2023 06:45:39 EST Ventricular Rate:  104 PR Interval:  195 QRS Duration:  87 QT Interval:  333 QTC Calculation: 438 R Axis:   27  Text Interpretation: Sinus tachycardia Confirmed by Drema Pry [phone]) on 04/14/2023 6:49:40 AM  Radiology CT ABDOMEN PELVIS W CONTRAST Result Date: 04/14/2023 CLINICAL DATA:  Acute generalized abdominal pain. History of pancreatic cancer. EXAM: CT ABDOMEN AND PELVIS WITH CONTRAST TECHNIQUE: Multidetector CT imaging of the abdomen and pelvis was performed using the standard protocol following bolus administration of intravenous contrast. RADIATION DOSE REDUCTION: This exam was performed according to the departmental dose-optimization program which includes automated exposure control, adjustment of the mA and/or kV according to patient size and/or use of iterative reconstruction technique. CONTRAST:  OMNIPAQUE IOHEXOL 300 MG/ML  SOLN COMPARISON:  March 20, 2023. FINDINGS: Lower chest: No acute abnormality. Hepatobiliary: No cholelithiasis or biliary dilatation is noted. Multiple hepatic lesions are again noted consistent with metastatic disease. The largest measures 2.8 x 2.2 cm in posterior segment of right  hepatic lobe which is enlarged compared to prior exam. 2.6 x 2.6 cm lesion is noted in superior portion of right hepatic lobe which is enlarged compared to prior exam as well. Pancreas: 3.5 x 2.3 cm pancreatic head mass is noted consistent with malignancy. Mild dilatation of common bile duct is noted. 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 It also seems to surround the proximal portion of the superior mesenteric artery, but it remains patent. Spleen: Normal in size without focal abnormality. Adrenals/Urinary Tract: Adrenal glands are unremarkable. Decreased enhancement of left kidney is noted. Moderate bilateral hydroureteronephrosis is noted without evidence of obstructing calculus, concerning for distal ureteral occlusion. Urinary bladder is only minimally distended. Stomach/Bowel: The stomach is unremarkable. There is no evidence of small bowel dilatation. Large amount of stool and fluid is seen throughout the colon. The appendix appears normal. There appears to be moderate to severe wall thickening of the rectum and possibly distal sigmoid colon suggesting inflammation or possibly malignancy. There are noted probable enhancing soft tissue lesions in this area suggesting peritoneal carcinomatosis. Vascular/Lymphatic: Abdominal aorta is unremarkable. 1.4 cm right external iliac lymph node is noted concerning for metastatic disease. 8 mm left periaortic lymph node is noted. 9 mm right periaortic lymph node is noted as well. Reproductive: Status post hysterectomy. No adnexal masses. Other: No ascites or hernia is noted. Musculoskeletal: No acute or significant osseous findings. IMPRESSION: There is interval development of moderate bilateral hydroureteronephrosis without evidence of obstructing calculus. This most likely is due to obstruction of distal ureters due to external compression, most likely due to possible peritoneal implants or other  metastatic disease in the pelvis. There is noted severe wall thickening of the rectum and probable distal sigmoid colon due to either inflammation or metastatic disease. Hepatic metastatic lesions are again noted which appear to be increased in size compared to prior exam. 3.5 x 2.3 cm pancreatic head mass is noted consistent with malignancy. This appears to be causing occlusion of the superior mesenteric  vein with collateral flow to the splenic vein. This mass also appears to encase the superior mesenteric artery, but the artery remains patent. Retroperitoneal and right external iliac adenopathy is noted consistent with metastatic disease. Electronically Signed   By: Lupita Raider M.D.   On: 04/14/2023 12:41   DG Abdomen 1 View Result Date: 04/14/2023 CLINICAL DATA:  Abdominal pain and constipation EXAM: ABDOMEN - 1 VIEW COMPARISON:  CT abdomen and pelvis dated 03/20/2023 FINDINGS: Nonobstructive bowel gas pattern. No pneumatosis. Large volume stool throughout the colon. No abnormal radio-opaque calculi or mass effect. No acute or substantial osseous abnormality. The sacrum and coccyx are partially obscured by overlying bowel contents. IMPRESSION: Large volume stool throughout the colon. Electronically Signed   By: Agustin Cree M.D.   On: 04/14/2023 10:01    Procedures Procedures    Medications Ordered in ED Medications  ondansetron (ZOFRAN-ODT) disintegrating tablet 4 mg (has no administration in time range)  cefTRIAXone (ROCEPHIN) 2 g in sodium chloride 0.9 % 100 mL IVPB (has no administration in time range)    And  metroNIDAZOLE (FLAGYL) IVPB 500 mg (has no administration in time range)  morphine (PF) 4 MG/ML injection 4 mg (4 mg Intravenous Given 04/14/23 0942)  iohexol (OMNIPAQUE) 300 MG/ML solution 100 mL (100 mLs Intravenous Contrast Given 04/14/23 1003)  potassium chloride SA (KLOR-CON M) CR tablet 40 mEq (40 mEq Oral Given 04/14/23 0941)  morphine (PF) 4 MG/ML injection 4 mg (4 mg Intravenous  Given 04/14/23 1102)  morphine (PF) 4 MG/ML injection 4 mg (4 mg Intravenous Given 04/14/23 1308)    ED Course/ Medical Decision Making/ A&P Clinical Course as of 04/15/23 0656  Fri Apr 14, 2023  0927 CBC(!) Anemia noted stable.  Lipase normal [JK]  0927 Comprehensive metabolic panel(!) Potassium level decreased [JK]  1104 Urinalysis without signs of infection [JK]  1136 Discussed with palliative medicine.  They will consult on patient [JK]  1246 CT scan shows moderate bilateral hydro ureteral nephrosis without evidence of obstructing calculi.  Patient also has evidence of severe wall thickening of the rectum and distal sigmoid colon either due to inflammation or metastatic disease.  Patient has noted metastatic lesions in the liver and pancreas [JK]  1322 Case discussed with Dr Robb Matar regarding admission [JK]  1405 D/w D Gay.   Will see pt in consultation.  Keep NPO for now in case a procedure is needed [JK]    Clinical Course User Index [JK] Linwood Dibbles, MD                                 Medical Decision Making Differential diagnosis includes but not limited to abdominal pain associated with her metastatic cancer, constipation colitis, diverticulitis  Problems Addressed: Hydronephrosis, unspecified hydronephrosis type: undiagnosed new problem with uncertain prognosis Metastatic malignant neoplasm, unspecified site [facility]): chronic illness or injury with exacerbation, progression, or side effects of treatment  Amount and/or Complexity of Data Reviewed Labs: ordered. Decision-making details documented in ED Course. Radiology: ordered.  Risk Prescription drug management. Decision regarding hospitalization.   Patient presented to the ED with complaints of persistent and severe abdominal pain associated with constipation.  Patient has known history of metastatic pancreatic cancer.  Unfortunately she is not undergoing any active treatment at this time.  She is under palliative care.   Patient has been on multiple chronic opiate pain medications for her cancer related pain unfortunately her symptoms have  not been well-controlled.  Patient also complaining of constipation.  No evidence of fecal impaction on exam.  CT scan was performed and it does show possible inflammation in the rectal area.  Possibly related to her cancer versus infection.  No leukocytosis.  I will start the patient on a course of antibiotics in case there is a bacterial component for the thickened bowel.  I have also consulted with palliative care as ultimately I think most of her symptoms are related to her cancer as well as her need for chronic opiates which is contributing to her constipation.  CT scan also shows hydronephrosis.  I will consult with urology to make sure they do not feel that any intervention is needed such as renal stents.  Plan admission to the hospital for further treatment.  Palliative medicine will consult.        Final Clinical Impression(s) / ED Diagnoses Final diagnoses:  Metastatic malignant neoplasm, unspecified site [facility])  Hydronephrosis, unspecified hydronephrosis type    Rx / DC Orders ED Discharge Orders     None         Linwood Dibbles, MD 04/15/23 [phone]

## 2023-04-14 NOTE — H&P (Signed)
 History and Physical    Patient: Kaitlin Ferrell ZOX:[id number] DOB: [birth year] DOA: 04/14/2023 DOS: the patient was seen and examined on 04/14/2023 PCP: Bernadette Hoit, MD  Patient coming from: Home  Chief Complaint:  Chief Complaint  Patient presents with   Abdominal Pain   Constipation   HPI: Kaitlin Ferrell is a 58 y.o. female with medical history significant of gestational trophoblastic neoplasia, family history of breast cancer, hypertension, and migraine headaches, GERD, diastases recti, ventral hernia status post repair, DVT, pancreatic cancer, constipation who presented to the emergency department complaints of progressively worse abdominal pain associated with constipation for the past week that has not responded to mag citrate, Fleet enema and laxatives.  Oncology has referred the patient to palliative care due to lack of response to chemotherapy.   No  nausea, emesis, diarrhea,  melena or hematochezia.  No flank pain, dysuria, frequency or hematuria.  She denied fever, chills, rhinorrhea, sore throat, wheezing or hemoptysis.  No chest pain, palpitations, diaphoresis, PND, orthopnea or pitting edema of the lower extremities. No polyuria, polydipsia, polyphagia or blurred vision.   Lab work: Urinalysis shows small hemoglobin and ketones of 20 mg/dL. CBC showed a white count of 5.6, hemoglobin 10.4 g/dL and platelets 811. Magnesium is 2.5 and phosphorus 3.3 mg/deciliter.  Lipase is normal.  CMP showed a potassium of 3.1 mmol/L, glucose of 105 and creatinine of 1.10 mg/dL, the rest of the CMP measurements were normal.  Imaging: 1 view abdominal x-ray showed large volume stool throughout the colon.  CT abdomen/pelvis with contrast showing interval development of moderate bilateral hydroureteronephrosis without evidence of obstructing calculus.  Likely due to external compression of distal ureters due to peritoneal implants or other metastatic disease in the pelvis.  There is  also severe wall thickening of the rectum Probable distal sigmoid: Due to either inflammation or metastatic disease.  3.5 x 2.3 cm pancreatic head mass appears to be causing occlusion of the superior mesenteric vein with collateral flow to the splenic vein.  It also appears to encase the superior mesenteric artery, but the abdomen remains patent.  Retroperitoneum right external iliac adenopathy is noted consistent with metastatic disease.  ED course: Initial vital signs were temperature 98.5 F, pulse 105, respiration 15, BP 161/93 mmHg O2 sat 95% on room air.  The patient received morphine 4 mg IVP and KCl 40 mEq p.o. x 1.   Review of Systems: As mentioned in the history of present illness. All other systems reviewed and are negative.  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   treated with 4 cycles of actinomycin D from 12-02-11 thru 01-13-12   H/O molar pregnancy, antepartum    Hypertension    Migraine    Miscarriage    Past Surgical History:  Procedure Laterality Date   BREAST BIOPSY  2001   Left   BREAST LUMPECTOMY     DILATION AND CURETTAGE OF UTERUS  1992, 09/2011   DILATION AND EVACUATION  2011   INSERTION OF MESH N/A 09/03/2013   Procedure: INSERTION OF MESH;  Surgeon: Ardeth Sportsman, MD;  Location: [facility];  Service: General;  Laterality: N/A;   IR CHEST FLUORO  04/14/2022   IR IMAGING GUIDED PORT INSERTION  03/22/2022   IR IMAGING GUIDED PORT INSERTION  05/02/2022   IR PORT REPAIR  CENTRAL VENOUS ACCESS DEVICE  04/19/2022   IR REMOVAL TUN CV CATH W/O FL  05/02/2022   IR US LIVER BIOPSY  03/22/2022   PORTACATH PLACEMENT  11/2011   PORTACATH PLACEMENT     REMOVAL  MARCH 2014   VENTRAL HERNIA REPAIR N/A 09/03/2013   Procedure: LAPAROSCOPIC VENTRAL WALL HERNIA REPAIR;  Surgeon: Ardeth Sportsman, MD;   Location: [facility];  Service: General;  Laterality: N/A;   WISDOM TOOTH EXTRACTION     in 11th grade   Social History:  reports that she has never smoked. She has never used smokeless tobacco. She reports current alcohol use of about 1.0 standard drink of alcohol per week. She reports that she does not use drugs.  Allergies  Allergen Reactions   Compazine [Prochlorperazine Edisylate]     Very agitated   Propofol Other (See Comments)    Headache   Oxycodone-Acetaminophen    Prochlorperazine    Labetalol Hives, Itching, Swelling and Rash    Family History  Adopted: Yes  Problem Relation Age of Onset   Breast cancer Mother        dx > 50   Heart attack Mother    Diabetes Father    Breast cancer Maternal Grandmother        ? < 50   Stroke Paternal Grandmother    Breast cancer Maternal Aunt        dx > 50   Breast cancer Maternal Aunt        dx > 50   Pancreatic cancer Maternal Uncle    Pancreatic cancer Cousin     Prior to Admission medications   Medication Sig Start Date End Date Taking? Authorizing Provider  citalopram (CELEXA) 10 MG tablet Take 1 tablet (10 mg total) by mouth daily. 04/11/23   Bernadette Hoit, MD  eletriptan (RELPAX) 40 MG tablet Take 1 tablet (40 mg total) by mouth as needed for migraine or headache. May repeat in 2 hours if needed. 09/22/14   Nilda Riggs, NP  HYDROmorphone (DILAUDID) 2 MG tablet Take 1 tablet (2 mg total) by mouth every 4 (four) hours as needed for severe pain (pain score 7-10). 04/10/23   Pickenpack-Cousar, Arty Baumgartner, NP  lidocaine-prilocaine (EMLA) cream Apply 1 Application topically as needed. 09/29/22   Pickenpack-Cousar, Arty Baumgartner, NP  LORazepam (ATIVAN) 0.5 MG tablet Take 0.5 mg by mouth as needed for anxiety. 03/22/22   [provider]  LORazepam (ATIVAN) 1 MG tablet Take 1 tablet (1 mg total) by mouth every 8 (eight) hours. 04/11/23   Bernadette Hoit, MD  magnesium oxide (MAG-OX) 400 (241.3 MG) MG tablet TAKE 1 TABLET BY  MOUTH TWICE A DAY 11/16/14   Nilda Riggs, NP  morphine (MS CONTIN) 15 MG 12 hr tablet Take 1 tablet (15 mg total) by mouth every 8 (eight) hours. 04/13/23   Pickenpack-Cousar, Arty Baumgartner, NP  morphine (MS CONTIN) 30 MG 12 hr tablet Take 1 tablet (30 mg total) by mouth every 8 (eight) hours. 04/13/23   Pickenpack-Cousar, Arty Baumgartner, NP  naloxegol oxalate (MOVANTIK) 12.5 MG TABS tablet Take 1 tablet (12.5 mg total) by mouth daily. 04/13/23   Pickenpack-Cousar, Arty Baumgartner, NP  OLANZapine (ZYPREXA) 10 MG tablet TAKE 1 TABLET BY MOUTH EVERYDAY AT BEDTIME 04/20/22   Georga Kaufmann T, PA-C  ondansetron (ZOFRAN) 8 MG tablet Take 1 tablet (8 mg total) by mouth every 8 (eight) hours as needed for nausea or vomiting. 03/14/22   Briant Cedar, PA-C  ondansetron (ZOFRAN-ODT) 8 MG disintegrating tablet Take 1 tablet (8 mg total) by mouth every 8 (eight) hours as needed for nausea or vomiting. 05/17/22   Walisiewicz, Kaitlyn E, PA-C  pantoprazole (PROTONIX) 40 MG tablet Take 40 mg by mouth every morning. 03/17/23   [provider]  promethazine (PHENERGAN) 25 MG tablet Take 1 tablet (25 mg total) by mouth every 6 (six) hours as needed for nausea or vomiting. 05/04/22   Jaci Standard, MD  scopolamine (TRANSDERM-SCOP) 1 MG/3DAYS Place 1 patch (1.5 mg total) onto the skin every 3 (three) days. 07/22/22   Briant Cedar, PA-C  tinidazole (TINDAMAX) 500 MG tablet Take 1,000 mg by mouth daily. 04/07/23   [provider]  YUVAFEM 10 MCG TABS vaginal tablet Place 1 tablet vaginally 2 (two) times a week. For 14 days, then insert vaginally twice weekly. 03/03/23   [provider]    Physical Exam: Vitals:   04/14/23 1033 04/14/23 1037 04/14/23 1130 04/14/23 1240  BP: (!) 158/85  (!) 158/100 (!) 141/88  Pulse: 96  94 98  Resp: 16  16 15   Temp:  98 F (36.7 C)    TempSrc:      SpO2: 100%  98% 98%  Weight:      Height:       Physical Exam Vitals and nursing note reviewed.  Constitutional:       General: She is awake. She is not in acute distress.    Appearance: She is well-developed. She is ill-appearing.  HENT:     Head: Normocephalic.     Nose: No rhinorrhea.     Mouth/Throat:     Mouth: Mucous membranes are dry.  Eyes:     General: No scleral icterus.    Pupils: Pupils are equal, round, and reactive to light.  Neck:     Vascular: No JVD.  Cardiovascular:     Rate and Rhythm: Normal rate and regular rhythm.     Heart sounds: S1 normal and S2 normal.  Pulmonary:     Effort: Pulmonary effort is normal.     Breath sounds: No wheezing, rhonchi or rales.  Abdominal:     General: Bowel sounds are normal. There is no distension.     Palpations: Abdomen is soft.     Tenderness: There is abdominal tenderness in the suprapubic area and left lower quadrant. There is no right CVA tenderness, left CVA tenderness, guarding or rebound.  Musculoskeletal:     Cervical back: Neck supple.     Right lower leg: No edema.     Left lower leg: No edema.  Skin:    General: Skin is warm and dry.  Neurological:     General: No focal deficit present.     Mental Status: She is alert and oriented to person, place, and time.  Psychiatric:        Mood and Affect: Mood normal.        Behavior: Behavior normal. Behavior is cooperative.     Data Reviewed:  Results are pending, will review when available.  Assessment and Plan: Principal Problem:   Intractable abdominal pain In the setting of metastatic:   Pancreatic adenocarcinoma (HCC) Complicated by:   Proctitis Due to:   Constipation for 1 week Inpatient/MedSurg. Continue IV fluids. Clear liquid diet. May advance to soft diet as tolerated. Keep n.p.o. after midnight. Analgesics as needed. Antiemetics as needed. Pantoprazole 40 mg IVP daily. Continue metronidazole 500 mg IVPB every 12 hours. Continue  ceftriaxone 2 g IVPB daily. Follow CBC, CMP and lipase in AM.  Active Problems:   Gastroesophageal reflux disease without  esophagitis Pantoprazole 40 mg IVP x 1. Antiacid, H2 blocker or PPI as needed.    Hypokalemia Supplementing. Follow-up potassium level in AM.    Bilateral hydronephrosis Urology consult appreciated.     Advance Care Planning:   Code Status: Full Code   Consults: Urology (Dr. Jettie Pagan).  Family Communication:   Severity of Illness: The appropriate patient status for this patient is INPATIENT. Inpatient status is judged to be reasonable and necessary in order to provide the required intensity of service to ensure the patient's safety. The patient's presenting symptoms, physical exam findings, and initial radiographic and laboratory data in the context of their chronic comorbidities is felt to place them at high risk for further clinical deterioration. Furthermore, it is not anticipated that the patient will be medically stable for discharge from the hospital within 2 midnights of admission.   * I certify that at the point of admission it is my clinical judgment that the patient will require inpatient hospital care spanning beyond 2 midnights from the point of admission due to high intensity of service, high risk for further deterioration and high frequency of surveillance required.*  Author: Bobette Mo, MD 04/14/2023 1:30 PM  For on call review [email or web address].   This document was prepared using Dragon voice recognition software and may contain some unintended transcription errors.

## 2023-04-14 NOTE — ED Notes (Signed)
 Called lab to add Magnesium and phosphorus to previous samples sent.

## 2023-04-14 NOTE — ED Triage Notes (Signed)
 58 y/o female comes in c/o abdominal pain and constipation. Pt reports her last bowel movement was 04/07/23. Pt has taken Mag Citrate, Fleet Enema and laxatives without any relief. PT also reports a hx of Pancreatic Cancer with her last chemo treatment this past Friday.

## 2023-04-14 NOTE — ED Notes (Signed)
 Pt ambulated to bathroom

## 2023-04-14 NOTE — Plan of Care (Signed)

## 2023-04-14 NOTE — ED Notes (Signed)
 Patient ambulated to bathroom.

## 2023-04-14 NOTE — Consult Note (Signed)
 Urology Consult   Physician requesting consult: Sanda Klein, MD  Reason for consult: Bilateral hydronephrosis  History of Present Illness: Kaitlin Ferrell is a 58 y.o. female who is seen in consultation for bilateral hydronephrosis.  Of note, she has history of metastatic pancreatic cancer who is now undergoing palliative treatment.  She is newly receiving chemotherapy.  She states that over the past several days, she has had lower back pain and pelvic pain as well as constipation.  She denies any flank pain.  She states she has not had a bowel movement about a week.  CT A/P 04/14/2023 revealed moderate bilateral hydroureteronephrosis without evidence of obstructing stone likely due to obstruction of external compression from metastatic disease.  Of note, this is new from prior scan on 03/20/2023.  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   treated with 4 cycles of actinomycin D from 12-02-11 thru 01-13-12   H/O molar pregnancy, antepartum    Hypertension    Migraine    Miscarriage     Past Surgical History:  Procedure Laterality Date   BREAST BIOPSY  2001   Left   BREAST LUMPECTOMY     DILATION AND CURETTAGE OF UTERUS  1992, 09/2011   DILATION AND EVACUATION  2011   INSERTION OF MESH N/A 09/03/2013   Procedure: INSERTION OF MESH;  Surgeon: Ardeth Sportsman, MD;  Location: [facility];  Service: General;  Laterality: N/A;   IR CHEST FLUORO  04/14/2022   IR IMAGING GUIDED PORT INSERTION  03/22/2022   IR IMAGING GUIDED PORT INSERTION  05/02/2022   IR PORT REPAIR CENTRAL VENOUS ACCESS DEVICE  04/19/2022   IR REMOVAL TUN CV CATH W/O FL  05/02/2022   IR US LIVER BIOPSY  03/22/2022   PORTACATH PLACEMENT  11/2011   PORTACATH PLACEMENT     REMOVAL  MARCH 2014    VENTRAL HERNIA REPAIR N/A 09/03/2013   Procedure: LAPAROSCOPIC VENTRAL WALL HERNIA REPAIR;  Surgeon: Ardeth Sportsman, MD;  Location: [facility];  Service: General;  Laterality: N/A;   WISDOM TOOTH EXTRACTION     in 11th grade     Current Hospital Medications:  Home meds:  No current facility-administered medications on file prior to encounter.   Current Outpatient Medications on File Prior to Encounter  Medication Sig Dispense Refill   citalopram (CELEXA) 10 MG tablet Take 1 tablet (10 mg total) by mouth daily. 90 tablet 1   eletriptan (RELPAX) 40 MG tablet Take 1 tablet (40 mg total) by mouth as needed for migraine or headache. May repeat in 2 hours if needed. 15 tablet 6   HYDROmorphone (DILAUDID) 2 MG tablet Take 1 tablet (2 mg total) by mouth every 4 (four) hours as needed for severe pain (pain score 7-10). 60 tablet 0   lidocaine-prilocaine (EMLA) cream Apply 1 Application topically as needed. 30 g 2   LORazepam (ATIVAN) 0.5 MG tablet Take 0.5 mg by mouth as needed for anxiety.     LORazepam (ATIVAN) 1 MG tablet Take 1 tablet (1 mg total) by mouth every 8 (eight) hours. 30 tablet 0   magnesium oxide (MAG-OX) 400 (241.3 MG) MG tablet TAKE 1 TABLET BY MOUTH TWICE A DAY 60 tablet 6   morphine (MS CONTIN) 15 MG 12  hr tablet Take 1 tablet (15 mg total) by mouth every 8 (eight) hours.     morphine (MS CONTIN) 30 MG 12 hr tablet Take 1 tablet (30 mg total) by mouth every 8 (eight) hours.     naloxegol oxalate (MOVANTIK) 12.5 MG TABS tablet Take 1 tablet (12.5 mg total) by mouth daily. 30 tablet 0   OLANZapine (ZYPREXA) 10 MG tablet TAKE 1 TABLET BY MOUTH EVERYDAY AT BEDTIME 90 tablet 1   ondansetron (ZOFRAN) 8 MG tablet Take 1 tablet (8 mg total) by mouth every 8 (eight) hours as needed for nausea or vomiting. 90 tablet 0   ondansetron (ZOFRAN-ODT) 8 MG disintegrating tablet Take 1 tablet (8 mg total) by mouth every 8 (eight) hours as needed for nausea or vomiting. 20 tablet 0   pantoprazole  (PROTONIX) 40 MG tablet Take 40 mg by mouth every morning.     promethazine (PHENERGAN) 25 MG tablet Take 1 tablet (25 mg total) by mouth every 6 (six) hours as needed for nausea or vomiting. 30 tablet 0   scopolamine (TRANSDERM-SCOP) 1 MG/3DAYS Place 1 patch (1.5 mg total) onto the skin every 3 (three) days. 10 patch 12   tinidazole (TINDAMAX) 500 MG tablet Take 1,000 mg by mouth daily.     YUVAFEM 10 MCG TABS vaginal tablet Place 1 tablet vaginally 2 (two) times a week. For 14 days, then insert vaginally twice weekly.       Scheduled Meds: Continuous Infusions:  cefTRIAXone (ROCEPHIN)  IV     And   metronidazole     [START ON 04/15/2023] cefTRIAXone (ROCEPHIN)  IV     dextrose 5 % and 0.9 % NaCl     metronidazole     potassium chloride     PRN Meds:.acetaminophen **OR** acetaminophen, HYDROmorphone (DILAUDID) injection, ondansetron **OR** ondansetron (ZOFRAN) IV  Allergies:  Allergies  Allergen Reactions   Compazine [Prochlorperazine Edisylate]     Very agitated   Propofol Other (See Comments)    Headache   Oxycodone-Acetaminophen    Prochlorperazine    Labetalol Hives, Itching, Swelling and Rash    Family History  Adopted: Yes  Problem Relation Age of Onset   Breast cancer Mother        dx > 50   Heart attack Mother    Diabetes Father    Breast cancer Maternal Grandmother        ? < 50   Stroke Paternal Grandmother    Breast cancer Maternal Aunt        dx > 50   Breast cancer Maternal Aunt        dx > 50   Pancreatic cancer Maternal Uncle    Pancreatic cancer Cousin     Social History:  reports that she has never smoked. She has never used smokeless tobacco. She reports current alcohol use of about 1.0 standard drink of alcohol per week. She reports that she does not use drugs.  ROS: A complete review of systems was performed.  All systems are negative except for pertinent findings as noted.  Physical Exam:  Vital signs in last 24 hours: Temp:  [98 F (36.7  C)-98.5 F (36.9 C)] 98.2 F (36.8 C) (03/07 1353) Pulse Rate:  [90-109] 99 (03/07 1402) Resp:  [15-17] 15 (03/07 1402) BP: (141-161)/(76-100) 151/83 (03/07 1402) SpO2:  [95 %-100 %] 96 % (03/07 1402) Weight:  [52.6 kg] 52.6 kg (03/07 0630) Constitutional:  Alert and oriented, No acute distress Cardiovascular: Regular rate and rhythm  Respiratory: Normal respiratory effort, Lungs clear bilaterally GI: Abdomen is soft, nontender, nondistended, no abdominal masses GU: No CVA tenderness Neurologic: Grossly intact, no focal deficits Psychiatric: Normal mood and affect  Laboratory Data:  Recent Labs    04/14/23 0812  WBC 5.6  HGB 10.4*  HCT 34.7*  PLT 153    Recent Labs    04/14/23 0812  NA 136  K 3.1*  CL 100  GLUCOSE 105*  BUN 11  CALCIUM 9.1  CREATININE 1.10*     Results for orders placed or performed during the hospital encounter of 04/14/23 (from the past 24 hours)  Lipase, blood     Status: None   Collection Time: 04/14/23  8:12 AM  Result Value Ref Range   Lipase 21 11 - 51 U/L  Comprehensive metabolic panel     Status: Abnormal   Collection Time: 04/14/23  8:12 AM  Result Value Ref Range   Sodium 136 135 - 145 mmol/L   Potassium 3.1 (L) 3.5 - 5.1 mmol/L   Chloride 100 98 - 111 mmol/L   CO2 25 22 - 32 mmol/L   Glucose, Bld 105 (H) 70 - 99 mg/dL   BUN 11 6 - 20 mg/dL   Creatinine, Ser 8.29 (H) 0.44 - 1.00 mg/dL   Calcium 9.1 8.9 - 56.2 mg/dL   Total Protein 8.1 6.5 - 8.1 g/dL   Albumin 4.0 3.5 - 5.0 g/dL   AST 18 15 - 41 U/L   ALT 9 0 - 44 U/L   Alkaline Phosphatase 92 38 - 126 U/L   Total Bilirubin 0.7 0.0 - 1.2 mg/dL   GFR, Estimated 59 (L) >60 mL/min   Anion gap 11 5 - 15  CBC     Status: Abnormal   Collection Time: 04/14/23  8:12 AM  Result Value Ref Range   WBC 5.6 4.0 - 10.5 K/uL   RBC 3.76 (L) 3.87 - 5.11 MIL/uL   Hemoglobin 10.4 (L) 12.0 - 15.0 g/dL   HCT 13.0 (L) 86.5 - 78.4 %   MCV 92.3 80.0 - 100.0 fL   MCH 27.7 26.0 - 34.0 pg   MCHC  30.0 30.0 - 36.0 g/dL   RDW 69.6 (H) 29.5 - 28.4 %   Platelets 153 150 - 400 K/uL   nRBC 0.0 0.0 - 0.2 %  Magnesium     Status: Abnormal   Collection Time: 04/14/23  8:12 AM  Result Value Ref Range   Magnesium 2.5 (H) 1.7 - 2.4 mg/dL  Phosphorus     Status: None   Collection Time: 04/14/23  8:12 AM  Result Value Ref Range   Phosphorus 3.3 2.5 - 4.6 mg/dL  Urinalysis, Routine w reflex microscopic -Urine, Clean Catch     Status: Abnormal   Collection Time: 04/14/23  9:39 AM  Result Value Ref Range   Color, Urine YELLOW YELLOW   APPearance CLEAR CLEAR   Specific Gravity, Urine 1.011 1.005 - 1.030   pH 5.0 5.0 - 8.0   Glucose, UA NEGATIVE NEGATIVE mg/dL   Hgb urine dipstick SMALL (A) NEGATIVE   Bilirubin Urine NEGATIVE NEGATIVE   Ketones, ur 20 (A) NEGATIVE mg/dL   Protein, ur NEGATIVE NEGATIVE mg/dL   Nitrite NEGATIVE NEGATIVE   Leukocytes,Ua NEGATIVE NEGATIVE   RBC / HPF 0-5 0 - 5 RBC/hpf   WBC, UA 0-5 0 - 5 WBC/hpf   Bacteria, UA NONE SEEN NONE SEEN   Squamous Epithelial / HPF 0-5 0 - 5 /HPF  Mucus PRESENT    No results found for this or any previous visit (from the past 240 hours).  Renal Function: Recent Labs    04/10/23 0423 04/14/23 0812  CREATININE 1.06* 1.10*   Estimated Creatinine Clearance: 45.2 mL/min (A) (by C-G formula based on SCr of 1.1 mg/dL (H)).  Radiologic Imaging: CT ABDOMEN PELVIS W CONTRAST Result Date: 04/14/2023 CLINICAL DATA:  Acute generalized abdominal pain. History of pancreatic cancer. EXAM: CT ABDOMEN AND PELVIS WITH CONTRAST TECHNIQUE: Multidetector CT imaging of the abdomen and pelvis was performed using the standard protocol following bolus administration of intravenous contrast. RADIATION DOSE REDUCTION: This exam was performed according to the departmental dose-optimization program which includes automated exposure control, adjustment of the mA and/or kV according to patient size and/or use of iterative reconstruction technique. CONTRAST:   OMNIPAQUE IOHEXOL 300 MG/ML  SOLN COMPARISON:  March 20, 2023. FINDINGS: Lower chest: No acute abnormality. Hepatobiliary: No cholelithiasis or biliary dilatation is noted. Multiple hepatic lesions are again noted consistent with metastatic disease. The largest measures 2.8 x 2.2 cm in posterior segment of right hepatic lobe which is enlarged compared to prior exam. 2.6 x 2.6 cm lesion is noted in superior portion of right hepatic lobe which is enlarged compared to prior exam as well. Pancreas: 3.5 x 2.3 cm pancreatic head mass is noted consistent with malignancy. Mild dilatation of common bile duct is noted. 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 It also seems to surround the proximal portion of the superior mesenteric artery, but it remains patent. Spleen: Normal in size without focal abnormality. Adrenals/Urinary Tract: Adrenal glands are unremarkable. Decreased enhancement of left kidney is noted. Moderate bilateral hydroureteronephrosis is noted without evidence of obstructing calculus, concerning for distal ureteral occlusion. Urinary bladder is only minimally distended. Stomach/Bowel: The stomach is unremarkable. There is no evidence of small bowel dilatation. Large amount of stool and fluid is seen throughout the colon. The appendix appears normal. There appears to be moderate to severe wall thickening of the rectum and possibly distal sigmoid colon suggesting inflammation or possibly malignancy. There are noted probable enhancing soft tissue lesions in this area suggesting peritoneal carcinomatosis. Vascular/Lymphatic: Abdominal aorta is unremarkable. 1.4 cm right external iliac lymph node is noted concerning for metastatic disease. 8 mm left periaortic lymph node is noted. 9 mm right periaortic lymph node is noted as well. Reproductive: Status post hysterectomy. No adnexal masses. Other: No ascites or hernia is noted.  Musculoskeletal: No acute or significant osseous findings. IMPRESSION: There is interval development of moderate bilateral hydroureteronephrosis without evidence of obstructing calculus. This most likely is due to obstruction of distal ureters due to external compression, most likely due to possible peritoneal implants or other metastatic disease in the pelvis. There is noted severe wall thickening of the rectum and probable distal sigmoid colon due to either inflammation or metastatic disease. Hepatic metastatic lesions are again noted which appear to be increased in size compared to prior exam. 3.5 x 2.3 cm pancreatic head mass is noted consistent with malignancy. 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 This mass also appears to encase the superior mesenteric artery, but the artery remains patent. Retroperitoneal and right external iliac adenopathy is noted consistent with metastatic disease. Electronically Signed   By: Lupita Raider M.D.   On: 04/14/2023 12:41   DG Abdomen 1 View Result Date: 04/14/2023  CLINICAL DATA:  Abdominal pain and constipation EXAM: ABDOMEN - 1 VIEW COMPARISON:  CT abdomen and pelvis dated 03/20/2023 FINDINGS: Nonobstructive bowel gas pattern. No pneumatosis. Large volume stool throughout the colon. No abnormal radio-opaque calculi or mass effect. No acute or substantial osseous abnormality. The sacrum and coccyx are partially obscured by overlying bowel contents. IMPRESSION: Large volume stool throughout the colon. Electronically Signed   By: Agustin Cree M.D.   On: 04/14/2023 10:01    I independently reviewed the above imaging studies.  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  Reviewed clinical case.  Fortunately, she does not have any severe flank pain and her renal function is normal.  We discussed options  which include doing nothing, retrograde ureteral stents or nephrostomy tubes.  Unfortunate, given her overall clinical condition and that she is undergoing palliative care with lower like this pregnancy, we discussed that a urologic procedure would not lead to any increased survivability.  Discussed that her pain may be from urologic cause however classically, she does not have any flank pain.  For now, she prefers surveillance for things very appropriate.  If refractory flank pain or worsening kidney function, could proceed with ureteral stents.  We discussed about a 50% risk of failure with internal stones.  Discussed that nephrostomy tubes were carried best chance of decompression of the kidneys.  She is asked to take more time to consider and will make n.p.o. at midnight for possible procedure tomorrow.  Discussed extensively patient and her husband at bedside.  Matt R. Tylee Yum MD 04/14/2023, 4:37 PM  [facility]  Pager: [phone]

## 2023-04-15 ENCOUNTER — Other Ambulatory Visit: Payer: Self-pay

## 2023-04-15 DIAGNOSIS — K219 Gastro-esophageal reflux disease without esophagitis: Secondary | ICD-10-CM | POA: Diagnosis not present

## 2023-04-15 DIAGNOSIS — Z7189 Other specified counseling: Secondary | ICD-10-CM | POA: Diagnosis not present

## 2023-04-15 DIAGNOSIS — C799 Secondary malignant neoplasm of unspecified site: Secondary | ICD-10-CM | POA: Diagnosis not present

## 2023-04-15 DIAGNOSIS — Z515 Encounter for palliative care: Secondary | ICD-10-CM

## 2023-04-15 DIAGNOSIS — D649 Anemia, unspecified: Secondary | ICD-10-CM | POA: Insufficient documentation

## 2023-04-15 DIAGNOSIS — N1330 Unspecified hydronephrosis: Secondary | ICD-10-CM

## 2023-04-15 DIAGNOSIS — E876 Hypokalemia: Secondary | ICD-10-CM

## 2023-04-15 DIAGNOSIS — R109 Unspecified abdominal pain: Secondary | ICD-10-CM | POA: Diagnosis not present

## 2023-04-15 DIAGNOSIS — K5903 Drug induced constipation: Secondary | ICD-10-CM

## 2023-04-15 DIAGNOSIS — D696 Thrombocytopenia, unspecified: Secondary | ICD-10-CM | POA: Insufficient documentation

## 2023-04-15 DIAGNOSIS — C259 Malignant neoplasm of pancreas, unspecified: Secondary | ICD-10-CM

## 2023-04-15 LAB — COMPREHENSIVE METABOLIC PANEL
ALT: 8 U/L (ref 0–44)
AST: 14 U/L — ABNORMAL LOW (ref 15–41)
Albumin: 3 g/dL — ABNORMAL LOW (ref 3.5–5.0)
Alkaline Phosphatase: 64 U/L (ref 38–126)
Anion gap: 7 (ref 5–15)
BUN: 11 mg/dL (ref 6–20)
CO2: 26 mmol/L (ref 22–32)
Calcium: 8 mg/dL — ABNORMAL LOW (ref 8.9–10.3)
Chloride: 105 mmol/L (ref 98–111)
Creatinine, Ser: 0.91 mg/dL (ref 0.44–1.00)
GFR, Estimated: >60 mL/min (ref >60)
Glucose, Bld: 123 mg/dL — ABNORMAL HIGH (ref 70–99)
Potassium: 3.7 mmol/L (ref 3.5–5.1)
Sodium: 138 mmol/L (ref 135–145)
Total Bilirubin: 0.5 mg/dL (ref 0.0–1.2)
Total Protein: 6.6 g/dL (ref 6.5–8.1)

## 2023-04-15 LAB — CBC
HCT: 27.1 % — ABNORMAL LOW (ref 36.0–46.0)
Hemoglobin: 8 g/dL — ABNORMAL LOW (ref 12.0–15.0)
MCH: 27.6 pg (ref 26.0–34.0)
MCHC: 29.5 g/dL — ABNORMAL LOW (ref 30.0–36.0)
MCV: 93.4 fL (ref 80.0–100.0)
Platelets: 120 10*3/uL — ABNORMAL LOW (ref 150–400)
RBC: 2.9 MIL/uL — ABNORMAL LOW (ref 3.87–5.11)
RDW: 16.9 % — ABNORMAL HIGH (ref 11.5–15.5)
WBC: 5.8 10*3/uL (ref 4.0–10.5)
nRBC: 0 % (ref 0.0–0.2)

## 2023-04-15 MED ORDER — HYDROMORPHONE HCL 1 MG/ML IJ SOLN
0.5000 mg | Freq: Once | INTRAMUSCULAR | Status: AC
  Administered 2023-04-15: 0.5 mg via INTRAVENOUS
  Filled 2023-04-15: qty 0.5

## 2023-04-15 MED ORDER — MORPHINE SULFATE ER 30 MG PO TBCR
60.0000 mg | EXTENDED_RELEASE_TABLET | Freq: Three times a day (TID) | ORAL | Status: DC
  Administered 2023-04-15 – 2023-04-19 (×12): 60 mg via ORAL
  Filled 2023-04-15 (×13): qty 2

## 2023-04-15 MED ORDER — LACTULOSE 10 GM/15ML PO SOLN
20.0000 g | Freq: Three times a day (TID) | ORAL | Status: DC
Start: 2023-04-15 — End: 2023-04-18
  Administered 2023-04-15 – 2023-04-16 (×3): 20 g via ORAL
  Filled 2023-04-15 (×5): qty 30

## 2023-04-15 MED ORDER — PANTOPRAZOLE SODIUM 40 MG PO TBEC
40.0000 mg | DELAYED_RELEASE_TABLET | Freq: Every day | ORAL | Status: DC
  Administered 2023-04-15 – 2023-04-19 (×5): 40 mg via ORAL
  Filled 2023-04-15 (×5): qty 1

## 2023-04-15 MED ORDER — SENNOSIDES-DOCUSATE SODIUM 8.6-50 MG PO TABS
2.0000 | ORAL_TABLET | Freq: Three times a day (TID) | ORAL | Status: DC
Start: 2023-04-15 — End: 2023-04-19
  Administered 2023-04-15 – 2023-04-19 (×9): 2 via ORAL
  Filled 2023-04-15 (×10): qty 2

## 2023-04-15 MED ORDER — HYDROMORPHONE HCL 2 MG PO TABS
2.0000 mg | ORAL_TABLET | ORAL | Status: DC | PRN
Start: 2023-04-15 — End: 2023-04-19
  Administered 2023-04-15 – 2023-04-17 (×4): 4 mg via ORAL
  Filled 2023-04-15 (×5): qty 2

## 2023-04-15 NOTE — Plan of Care (Signed)
   Problem: Clinical Measurements: Goal: Diagnostic test results will improve Outcome: Progressing

## 2023-04-15 NOTE — Consult Note (Signed)
 Consultation Note Date: 04/15/2023   Patient Name: Kaitlin Ferrell  DOB: [birth year]  MRN: [id number]  Age / Sex: 58 y.o., female  PCP: Bernadette Hoit, MD Referring Physician: Marinda Elk, MD  Reason for Consultation: Establishing goals of care, Non pain symptom management, and Pain control  HPI/Patient Profile: 58 y.o. female  admitted on 04/14/2023 .   Clinical Assessment and Goals of Care:  58 year old with life limiting illness of metastatic pancreatic cancer, undergoing palliative treatment, on chemotherapy, sees palliative care at [facility].  Palliative consult for pain management.  Chart reviewed, patient seen and examined.  Palliative medicine is specialized medical care for people living with serious illness. It focuses on providing relief from the symptoms and stress of a serious illness. The goal is to improve quality of life for both the patient and the family. Goals of care: Broad aims of medical therapy in relation to the patient's values and preferences. Our aim is to provide medical care aimed at enabling patients to achieve the goals that matter most to them, given the circumstances of their particular medical situation and their constraints.  Patient states that she still has ongoing abdominal pain, she has had relief from constipation.  CT A/P 04/14/2023 revealed moderate bilateral hydroureteronephrosis without evidence of obstructing stone likely due to obstruction of external compression from metastatic disease.  Urology note reviewed and also discussed with patient.   NEXT OF KIN  spouse  SUMMARY OF RECOMMENDATIONS    Full Code. Pain management: MS Contin 60 mg PO Q 8 hours, Dilaudid PO PRN. Also on IV Dilaudid PRN. Continue to monitor. Consider celiac plexus nerve block if no significant relief from current opioid regimen, discussed with patient and she is in agreement.  Thank  you for the consult.   Code Status/Advance Care Planning: Full code   Symptom Management:     Palliative Prophylaxis:  Frequent Pain Assessment  Additional Recommendations (Limitations, Scope, Preferences): Full Scope Treatment  Psycho-social/Spiritual:  Desire for further Chaplaincy support:yes Additional Recommendations: Caregiving  Support/Resources  Prognosis:  Unable to determine  Discharge Planning: To Be Determined      Primary Diagnoses: Present on Admission:  Intractable abdominal pain  Pancreatic adenocarcinoma (HCC)  Hypokalemia  Gastroesophageal reflux disease without esophagitis  Bilateral hydronephrosis  Proctitis  Constipation   I have reviewed the medical record, interviewed the patient and family, and examined the patient. The following aspects are pertinent.  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   treated with 4 cycles of actinomycin D from 12-02-11 thru 01-13-12   H/O molar pregnancy, antepartum    Hypertension    Migraine    Miscarriage    Social History   Socioeconomic History   Marital status: Married    Spouse name: [name]   Number of children: 2   Years of education: college  Highest education level: Not on file  Occupational History    Comment: C[employer]  Tobacco Use   Smoking status: Never   Smokeless tobacco: Never  Substance and Sexual Activity   Alcohol use: Yes    Alcohol/week: 1.0 standard drink of alcohol    Types: 1 Glasses of wine per week    Comment: occas   Drug use: No   Sexual activity: Not Currently    Birth control/protection: Pill  Other Topics Concern   Not on file  Social History Narrative   Patient is married [name]). Patient works part time at [employer].   Right handed.   Patient has her CMA.   Caffeine- None          Social Drivers of Corporate investment banker Strain: Not on file  Food Insecurity: No Food Insecurity (04/15/2023)   Hunger Vital Sign    Worried About Running Out of Food in the Last Year: Never true    Ran Out of Food in the Last Year: Never true  Transportation Needs: No Transportation Needs (04/15/2023)   PRAPARE - Administrator, Civil Service (Medical): No    Lack of Transportation (Non-Medical): No  Physical Activity: Not on file  Stress: Not on file  Social Connections: Not on file   Family History  Adopted: Yes  Problem Relation Age of Onset   Breast cancer Mother        dx > 50   Heart attack Mother    Diabetes Father    Breast cancer Maternal Grandmother        ? < 50   Stroke Paternal Grandmother    Breast cancer Maternal Aunt        dx > 50   Breast cancer Maternal Aunt        dx > 50   Pancreatic cancer Maternal Uncle    Pancreatic cancer Cousin    Scheduled Meds:  Chlorhexidine Gluconate Cloth  6 each Topical Daily   morphine  60 mg Oral Q8H   sodium chloride flush  10-40 mL Intracatheter Q12H   Continuous Infusions:  cefTRIAXone (ROCEPHIN)  IV     metronidazole 500 mg (04/15/23 0951)   PRN Meds:.acetaminophen **OR** acetaminophen, HYDROmorphone (DILAUDID) injection, HYDROmorphone, lip balm, ondansetron **OR** ondansetron (ZOFRAN) IV, sodium chloride flush Medications Prior to Admission:  Prior to Admission medications   Medication Sig Start Date End Date Taking? Authorizing Provider  acetaminophen (TYLENOL) 500 MG tablet Take 1,000 mg by mouth every 6 (six) hours as needed for mild pain (pain score 1-3) or headache.   Yes [provider]  eletriptan (RELPAX) 40 MG tablet Take 1 tablet (40 mg total) by mouth as needed for migraine or headache. May repeat in 2 hours if needed. Patient taking differently: Take 40 mg by mouth as needed for migraine or headache (and may repeat in 2 hours, if no relief). 09/22/14  Yes Nilda Riggs, NP  HYDROmorphone (DILAUDID) 2 MG tablet Take 1 tablet (2 mg total) by mouth every 4 (four) hours as needed for severe pain (pain score 7-10). 04/10/23  Yes Pickenpack-Cousar, Arty Baumgartner, NP  lidocaine-prilocaine (EMLA) cream Apply 1 Application topically as needed. Patient taking differently: Apply 1 Application topically as needed (for port access). 09/29/22  Yes Pickenpack-Cousar, Arty Baumgartner, NP  LORazepam (ATIVAN) 0.5 MG tablet Take 0.5 mg by mouth daily as needed for anxiety. 03/22/22  Yes [provider]  magnesium oxide (MAG-OX) 400 (241.3  MG) MG tablet TAKE 1 TABLET BY MOUTH TWICE A DAY 11/16/14  Yes Nilda Riggs, NP  morphine (MS CONTIN) 15 MG 12 hr tablet Take 1 tablet (15 mg total) by mouth every 8 (eight) hours. Patient taking differently: Take 30 mg by mouth See admin instructions. Take 30 mg by mouth at 6 AM, 2 PM, and 10 PM in conjunction with ONE 30 mg tablet to equal a total dose of 60 mg 04/13/23  Yes Pickenpack-Cousar, Athena N, NP  morphine (MS CONTIN) 30 MG 12 hr tablet Take 1 tablet (30 mg total) by mouth every 8 (eight) hours. Patient taking differently: Take 30 mg by mouth See admin instructions. Take 30 mg by mouth at 6 AM, 2 PM, and 10 PM in conjunction with TWO 15 mg tablets to equal a total dose of 60 mg 04/13/23  Yes Pickenpack-Cousar, Athena N, NP  OLANZapine (ZYPREXA) 10 MG tablet TAKE 1 TABLET BY MOUTH EVERYDAY AT BEDTIME Patient taking differently: Take 10 mg by mouth at bedtime. 04/20/22  Yes Georga Kaufmann T, PA-C  ondansetron (ZOFRAN) 8 MG tablet Take 1 tablet (8 mg total) by mouth every 8 (eight) hours as needed for nausea or vomiting. 03/14/22  Yes Georga Kaufmann T, PA-C  ondansetron (ZOFRAN-ODT) 8 MG disintegrating tablet Take 1 tablet (8 mg total) by mouth every 8 (eight) hours as needed for nausea or vomiting. Patient taking differently: Take 8 mg by mouth every 8 (eight) hours as needed for nausea or vomiting (dissolve orally). 05/17/22  Yes Walisiewicz,  Kaitlyn E, PA-C  pantoprazole (PROTONIX) 40 MG tablet Take 40 mg by mouth daily as needed (for GERD-like symptoms). 03/17/23  Yes [provider]  promethazine (PHENERGAN) 25 MG tablet Take 1 tablet (25 mg total) by mouth every 6 (six) hours as needed for nausea or vomiting. 05/04/22  Yes Jaci Standard, MD  scopolamine (TRANSDERM-SCOP) 1 MG/3DAYS Place 1 patch (1.5 mg total) onto the skin every 3 (three) days. 07/22/22  Yes Georga Kaufmann T, PA-C  YUVAFEM 10 MCG TABS vaginal tablet Place 10 mcg vaginally 2 (two) times a week. 03/03/23  Yes [provider]  citalopram (CELEXA) 10 MG tablet Take 1 tablet (10 mg total) by mouth daily. Patient not taking: Reported on 04/14/2023 04/11/23   Bernadette Hoit, MD  LORazepam (ATIVAN) 1 MG tablet Take 1 tablet (1 mg total) by mouth every 8 (eight) hours. Patient not taking: Reported on 04/14/2023 04/11/23   Bernadette Hoit, MD  naloxegol oxalate (MOVANTIK) 12.5 MG TABS tablet Take 1 tablet (12.5 mg total) by mouth daily. Patient not taking: Reported on 04/14/2023 04/13/23   Pickenpack-Cousar, Arty Baumgartner, NP   Allergies  Allergen Reactions   Compazine [Prochlorperazine Edisylate]     Very agitated   Propofol Other (See Comments)    Headache   Oxycodone-Acetaminophen Itching and Other (See Comments)    Welts, too   Prochlorperazine    Labetalol Hives, Itching, Swelling and Rash   Review of Systems +abdominal  pain.   Physical Exam Awake alert Mild to moderate distress due to pain Abdominal tenderness Trace edema Regular work of breathing.   Vital Signs: BP 123/78 (BP Location: Right Arm)   Pulse 87   Temp 99 F (37.2 C) (Oral)   Resp 16   Ht 5\' 2"  (1.575 m)   Wt 52.6 kg   LMP 06/26/2013 Comment: low dose BCP  SpO2 100%   BMI 21.22 kg/m  Pain Scale: 0-10   Pain Score: 7  SpO2: SpO2: 100 % O2 Device:SpO2: 100 % O2 Flow Rate: .   IO: Intake/output summary:  Intake/Output Summary (Last 24 hours) at 04/15/2023 1249 Last data  filed at 04/15/2023 0600 Gross per 24 hour  Intake 1292.66 ml  Output --  Net 1292.66 ml    LBM: Last BM Date : 04/14/23 Baseline Weight: Weight: 52.6 kg Most recent weight: Weight: 52.6 kg     Palliative Assessment/Data:   PPS 60%  Time In:  11 Time Out:  12 Time Total:  60  Greater than 50%  of this time was spent counseling and coordinating care related to the above assessment and plan.  Signed by: Rosalin Hawking, MD   Please contact Palliative Medicine Team phone at [phone] for questions and concerns.  For individual provider: See Loretha Stapler

## 2023-04-15 NOTE — TOC Initial Note (Signed)
 Transition of Care [facility]) - Initial/Assessment Note    Patient Details  Name: Kaitlin Ferrell MRN: [id number] Date of Birth: 1965-06-29  Transition of Care [facility]) CM/SW Contact:    Adrian Prows, RN Phone Number: 04/15/2023, 12:43 PM  Clinical Narrative:                 Sherron Monday w/ pt in room; pt says she lives at home w/ her spouse Lurleen Soltero [phone]); she plans to return at d/c; her husband will provide transportation; pt verified Insurance/PCP; she denies SDOH risks; pt says she does not have DME, HH services, or home oxygen; TOC will follow.  Expected Discharge Plan: Home/Self Care Barriers to Discharge: Continued Medical Work up   Patient Goals and CMS Choice Patient states their goals for this hospitalization and ongoing recovery are:: home CMS Medicare.gov Compare Post Acute Care list provided to:: Patient        Expected Discharge Plan and Services   Discharge Planning Services: CM Consult   Living arrangements for the past 2 months: Single Family Home                                      Prior Living Arrangements/Services Living arrangements for the past 2 months: Single Family Home Lives with:: Spouse Patient language and need for interpreter reviewed:: Yes Do you feel safe going back to the place where you live?: Yes      Need for Family Participation in Patient Care: Yes (Comment) Care giver support system in place?: Yes (comment) Current home services:  (n/a) Criminal Activity/Legal Involvement Pertinent to Current Situation/Hospitalization: No - Comment as needed  Activities of Daily Living   ADL Screening (condition at time of admission) Independently performs ADLs?: Yes (appropriate for developmental age) Is the patient deaf or have difficulty hearing?: No Does the patient have difficulty seeing, even when wearing glasses/contacts?: No Does the patient have difficulty concentrating, remembering, or making decisions?:  No  Permission Sought/Granted Permission sought to share information with : Case Manager Permission granted to share information with : Yes, Verbal Permission Granted  Share Information with NAME: Case Manager     Permission granted to share info w Relationship: TRUE Shackleford (spouse) [phone]     Emotional Assessment Appearance:: Appears stated age Attitude/Demeanor/Rapport: Gracious Affect (typically observed): Accepting Orientation: : Oriented to Self, Oriented to Place, Oriented to  Time, Oriented to Situation Alcohol / Substance Use: Not Applicable Psych Involvement: No (comment)  Admission diagnosis:  Intractable abdominal pain [R10.9] Hydronephrosis, unspecified hydronephrosis type [N13.30] Metastatic malignant neoplasm, unspecified site [facility]) [C79.9] Patient Active Problem List   Diagnosis Date Noted   Intractable abdominal pain 04/14/2023   Bilateral hydronephrosis 04/14/2023   Proctitis 04/14/2023   Abdominal pain 04/11/2023   Adjustment disorder with anxiety 04/11/2023   Constipation 04/11/2023   Intractable vomiting with nausea 03/25/2023   DVT of upper extremity (deep vein thrombosis) (HCC) 05/26/2022   Chemotherapy-induced nausea and vomiting 05/09/2022   Hypokalemia 05/09/2022   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) 05/09/2022   Port-A-Cath in place 04/01/2022   Genetic testing 04/01/2022   Family history of breast cancer 03/29/2022   Pancreatic adenocarcinoma (HCC) 03/24/2022   Gastroesophageal reflux disease without esophagitis 10/17/2018   History of bladder surgery 08/07/2018   Hx of hysterectomy 08/07/2018   Midline cystocele 06/14/2018   Rectocele 06/14/2018   Uterovaginal prolapse, incomplete 06/14/2018   Hx of  colonic polyp 10/13/2017   Hot flashes 04/22/2016   Insomnia 04/22/2016   Panic attack as reaction to stress 04/22/2016   Ventral hernia s/p lap repair w mesh July 2015 08/27/2012   Diastasis recti 08/27/2012   Migraine  headache 12/14/2011   Gestational trophoblastic disease s/p chemotherapy 2013 11/23/2011   H/O lumpectomy 11/11/1999   PCP:  Bernadette Hoit, MD Pharmacy:   [facility] [phone] Ginette Otto, [place] - [address] [place] [address] Phone: [phone] Fax: [phone]     Social Drivers of Health (SDOH) Social History: SDOH Screenings   Food Insecurity: No Food Insecurity (04/15/2023)  Housing: Low Risk  (04/15/2023)  Transportation Needs: No Transportation Needs (04/15/2023)  Utilities: Not At Risk (04/15/2023)  Depression (PHQ2-9): Low Risk  (04/11/2023)  Tobacco Use: Low Risk  (04/14/2023)   SDOH Interventions: Food Insecurity Interventions: Intervention Not Indicated, Inpatient TOC Housing Interventions: Intervention Not Indicated, Inpatient TOC Transportation Interventions: Intervention Not Indicated, Inpatient TOC Utilities Interventions: Intervention Not Indicated, Inpatient TOC   Readmission Risk Interventions    04/15/2023   12:41 PM  Readmission Risk Prevention Plan  Transportation Screening Complete  PCP or Specialist Appt within 3-5 Days Complete  HRI or Home Care Consult Complete  Social Work Consult for Recovery Care Planning/Counseling Complete  Palliative Care Screening Complete  Medication Review Oceanographer) Complete

## 2023-04-15 NOTE — Progress Notes (Signed)
 PROGRESS NOTE   Kaitlin Ferrell  HQI:[id number] DOB: 1965/09/07 DOA: 04/14/2023 PCP: Bernadette Hoit, MD   Date of Service: the patient was seen and examined on 04/15/2023  Brief Narrative:  58 y.o. female with medical history significant of metastatic adenocardinoma of the pancreas (Dx 03/22/2022, follows with Dr. Leonides Schanz,  on FOLFIRINOX q2weeks), hypertension, GERD, portal vein occlusion and right upper extremity DVT (05/2022 on Eliquis)  who presented to the emergency department complaints of progressively worsening abdominal pain associated with constipation for the past week that has not responded to mag citrate, Fleet enema and laxatives.   Of note, the patient has been receiving increasing doses of Dilaudid 2-4mg  Q4hrs as needed as well as MS Contin 60 mg every 8 hours for worsening pain.  Workup for UTI in the past week has been negative.   Upon evaluation in the emergency department, CT abdomen/pelvis with contrast showing interval development of moderate bilateral hydroureteronephrosis, hepatic metastatic lesions that appear to be increasing in size, severe wall thickening of the likely distal sigmoid thought to be due to either inflammation or metastatic disease and redemonstration of the 3.5 x 2.3 cm pancreatic head mass that appears to be causing occlusion of the superior mesenteric vein with collateral flow to the splenic vein.  Dr. Cardell Peach with urology was consulted due to the bilateral hydronephrosis.  The hospitalist group was then called to assess the patient for admission to the hospital.   Assessment & Plan Intractable abdominal pain Patient experiencing severe bilateral flank and lower abdominal pain that has been progressively worsening over the past week Progressively worsening pain is felt to be secondary to progressive tumor burden in the pelvis with possible contribution of progressively worsening bilateral hydronephrosis High-dose opiate therapy has recently been titrated  and patient is currently on MS Contin 60 mg every 8 hours Patient is additionally being provided with as needed intravenous Dilaudid for severe pain Palliative care has been consulted to assist with pain management, their input is appreciated After lengthy discussion with palliative care and the patient, we feel that the patient may benefit from celiac plexus block as a means to reduce her reliance on opiate therapy.  I have discussed the case with interventional radiology and placed an order and this is likely to be done on Monday Bilateral hydronephrosis Substantial bilateral hydronephrosis, felt to be secondary to increasing mass effect due to tumor burden in the pelvis Case discussed with Dr. Cardell Peach with urology, patient is felt to not be able to benefit from ureteral stents with bilateral nephrostomy tubes likely being the only means of diverting urine output and preserving renal function Patient was originally apprehensive about proceeding with nephrostomy tubes but after a follow-up discussion with both myself and Dr. Bertis Ruddy with oncology the patient is now agreeable Nephrostomy tubes discussed with interventional radiology and orders have been placed.  Patient is likely to proceed with bilateral nephrostomy tube placement on Monday. Pancreatic adenocarcinoma (HCC) Evidence of chemotherapy failure and substantial progression of metastatic pancreatic cancer on CT imaging with multiple developing complications including bilateral hydronephrosis as well as moderate to severe wall thickening of the rectum and distal sigmoid colon suggesting possibly malignancy. Patient typically follows with Dr. Leonides Schanz in the outpatient setting. I have discussed the case thoroughly with Dr. Bertis Ruddy who has evaluated the patient at the bedside today in consultation, her input is appreciated. Fortunately the patient is not a candidate for further chemotherapies.  Overall prognosis is quite poor  Palliative care has  additionally been  consulted to assist with goals of care discussions. Constipation Large amount of stool seen throughout the colon, likely secondary to progressive tumor burden and ongoing opiate therapy Oncology has initiated patient on regimen of both senna and lactulose 3 times daily, will monitor for response Gastroesophageal reflux disease without esophagitis Protonix daily Hypokalemia Replacing with potassium chloride Evaluating for concurrent hypomagnesemia  Monitoring potassium levels with serial chemistries.  Normocytic anemia Normocytic anemia and thrombocytopenia likely secondary to recent chemotherapy No clinical evidence of bleeding Iron panel, ferritin, folate, vitamin B12 Monitoring counts with serial CBCs Thrombocytopenia (HCC) See assessment and plan above     Subjective:  Patient complaining of bilateral flank and lower abdominal pain.  Sharp in quality, moderate to severe in intensity, worse with movement and improved with rest.  This is associated with intense constipation.  Physical Exam:  Vitals:   04/15/23 0124 04/15/23 0519 04/15/23 0958 04/15/23 1326  BP: (!) 149/93 123/78 123/78 137/82  Pulse: 91 87 87 97  Resp: 15 16 16 16   Temp: 97.6 F (36.4 C) 98.2 F (36.8 C) 99 F (37.2 C) 98.3 F (36.8 C)  TempSrc: Oral Oral Oral Oral  SpO2: 98% 97% 100% 98%  Weight:      Height:        Constitutional: Awake alert and oriented x3, patient is in distress due to pain. Skin: no rashes, no lesions, poor skin turgor noted. Eyes: Pupils are equally reactive to light.  No evidence of scleral icterus or conjunctival pallor.  ENMT: Dry mucous membranes noted.  Posterior pharynx clear of any exudate or lesions.   Respiratory: clear to auscultation bilaterally, no wheezing, no crackles. Normal respiratory effort. No accessory muscle use.  Cardiovascular: Regular rate and rhythm, no murmurs / rubs / gallops. No extremity edema. 2+ pedal pulses. No carotid bruits.   Abdomen: Lower abdominal and bilateral flank tenderness.  Abdomen is soft.  Positive bowel sounds noted in all quadrants.   Musculoskeletal: No joint deformity upper and lower extremities. Good ROM, no contractures. Normal muscle tone.    Data Reviewed:  I have personally reviewed and interpreted labs, imaging.  Significant findings are   CBC: Recent Labs  Lab 04/10/23 0423 04/14/23 0812 04/15/23 0306  WBC 5.0 5.6 5.8  NEUTROABS 3.4  --   --   HGB 9.7* 10.4* 8.0*  HCT 31.6* 34.7* 27.1*  MCV 92.7 92.3 93.4  PLT 161 153 120*   Basic Metabolic Panel: Recent Labs  Lab 04/10/23 0423 04/14/23 0812 04/15/23 0306  NA 137 136 138  K 3.6 3.1* 3.7  CL 102 100 105  CO2 27 25 26   GLUCOSE 111* 105* 123*  BUN 11 11 11   CREATININE 1.06* 1.10* 0.91  CALCIUM 9.0 9.1 8.0*  MG  --  2.5*  --   PHOS  --  3.3  --    GFR: Estimated Creatinine Clearance: 54.6 mL/min (by C-G formula based on SCr of 0.91 mg/dL). Liver Function Tests: Recent Labs  Lab 04/10/23 0423 04/14/23 0812 04/15/23 0306  AST 18 18 14*  ALT 9 9 8   ALKPHOS 87 92 64  BILITOT 0.8 0.7 0.5  PROT 7.3 8.1 6.6  ALBUMIN 3.5 4.0 3.0*    Code Status:  Full code.  Code status decision has been confirmed with: patient Family Communication: Husband has been updated on plan of care via phone conversation.   Severity of Illness:  The appropriate patient status for this patient is INPATIENT. Inpatient status is judged to be reasonable  and necessary in order to provide the required intensity of service to ensure the patient's safety. The patient's presenting symptoms, physical exam findings, and initial radiographic and laboratory data in the context of their chronic comorbidities is felt to place them at high risk for further clinical deterioration. Furthermore, it is not anticipated that the patient will be medically stable for discharge from the hospital within 2 midnights of admission.   * I certify that at the point of  admission it is my clinical judgment that the patient will require inpatient hospital care spanning beyond 2 midnights from the point of admission due to high intensity of service, high risk for further deterioration and high frequency of surveillance required.*  Time spent:  59 minutes  Author:  Marinda Elk MD  04/15/2023 8:07 PM

## 2023-04-15 NOTE — Consult Note (Addendum)
 Urology Consult   Physician requesting consult: Sanda Klein, MD  Reason for consult: Bilateral hydronephrosis  History of Present Illness: Kaitlin Ferrell is a 58 y.o. female who is seen in consultation for bilateral hydronephrosis.  Of note, she has history of metastatic pancreatic cancer who is now undergoing palliative treatment.  She is newly receiving chemotherapy.  She states that over the past several days, she has had lower back pain and pelvic pain as well as constipation.  She denies any flank pain.  She states she has not had a bowel movement about a week.  CT A/P 04/14/2023 revealed moderate bilateral hydroureteronephrosis without evidence of obstructing stone likely due to obstruction of external compression from metastatic disease.  Of note, this is new from prior scan on 03/20/2023.  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   treated with 4 cycles of actinomycin D from 12-02-11 thru 01-13-12   H/O molar pregnancy, antepartum    Hypertension    Migraine    Miscarriage     Past Surgical History:  Procedure Laterality Date   BREAST BIOPSY  2001   Left   BREAST LUMPECTOMY     DILATION AND CURETTAGE OF UTERUS  1992, 09/2011   DILATION AND EVACUATION  2011   INSERTION OF MESH N/A 09/03/2013   Procedure: INSERTION OF MESH;  Surgeon: Ardeth Sportsman, MD;  Location: [facility];  Service: General;  Laterality: N/A;   IR CHEST FLUORO  04/14/2022   IR IMAGING GUIDED PORT INSERTION  03/22/2022   IR IMAGING GUIDED PORT INSERTION  05/02/2022   IR PORT REPAIR CENTRAL VENOUS ACCESS DEVICE  04/19/2022   IR REMOVAL TUN CV CATH W/O FL  05/02/2022   IR US LIVER BIOPSY  03/22/2022   PORTACATH PLACEMENT  11/2011   PORTACATH PLACEMENT     REMOVAL  MARCH 2014    VENTRAL HERNIA REPAIR N/A 09/03/2013   Procedure: LAPAROSCOPIC VENTRAL WALL HERNIA REPAIR;  Surgeon: Ardeth Sportsman, MD;  Location: [facility];  Service: General;  Laterality: N/A;   WISDOM TOOTH EXTRACTION     in 11th grade     Current Hospital Medications:  Home meds:  No current facility-administered medications on file prior to encounter.   Current Outpatient Medications on File Prior to Encounter  Medication Sig Dispense Refill   acetaminophen (TYLENOL) 500 MG tablet Take 1,000 mg by mouth every 6 (six) hours as needed for mild pain (pain score 1-3) or headache.     eletriptan (RELPAX) 40 MG tablet Take 1 tablet (40 mg total) by mouth as needed for migraine or headache. May repeat in 2 hours if needed. (Patient taking differently: Take 40 mg by mouth as needed for migraine or headache (and may repeat in 2 hours, if no relief).) 15 tablet 6   HYDROmorphone (DILAUDID) 2 MG tablet Take 1 tablet (2 mg total) by mouth every 4 (four) hours as needed for severe pain (pain score 7-10). 60 tablet 0   lidocaine-prilocaine (EMLA) cream Apply 1 Application topically as needed. (Patient taking differently: Apply 1 Application topically as needed (for port access).) 30 g 2   LORazepam (ATIVAN) 0.5 MG tablet Take 0.5 mg by mouth daily as needed for anxiety.     magnesium oxide (MAG-OX) 400 (  241.3 MG) MG tablet TAKE 1 TABLET BY MOUTH TWICE A DAY 60 tablet 6   morphine (MS CONTIN) 15 MG 12 hr tablet Take 1 tablet (15 mg total) by mouth every 8 (eight) hours. (Patient taking differently: Take 30 mg by mouth See admin instructions. Take 30 mg by mouth at 6 AM, 2 PM, and 10 PM in conjunction with ONE 30 mg tablet to equal a total dose of 60 mg)     morphine (MS CONTIN) 30 MG 12 hr tablet Take 1 tablet (30 mg total) by mouth every 8 (eight) hours. (Patient taking differently: Take 30 mg by mouth See admin instructions. Take 30 mg by mouth at 6 AM, 2 PM, and 10 PM in conjunction with TWO 15 mg tablets to equal a  total dose of 60 mg)     OLANZapine (ZYPREXA) 10 MG tablet TAKE 1 TABLET BY MOUTH EVERYDAY AT BEDTIME (Patient taking differently: Take 10 mg by mouth at bedtime.) 90 tablet 1   ondansetron (ZOFRAN) 8 MG tablet Take 1 tablet (8 mg total) by mouth every 8 (eight) hours as needed for nausea or vomiting. 90 tablet 0   ondansetron (ZOFRAN-ODT) 8 MG disintegrating tablet Take 1 tablet (8 mg total) by mouth every 8 (eight) hours as needed for nausea or vomiting. (Patient taking differently: Take 8 mg by mouth every 8 (eight) hours as needed for nausea or vomiting (dissolve orally).) 20 tablet 0   pantoprazole (PROTONIX) 40 MG tablet Take 40 mg by mouth daily as needed (for GERD-like symptoms).     promethazine (PHENERGAN) 25 MG tablet Take 1 tablet (25 mg total) by mouth every 6 (six) hours as needed for nausea or vomiting. 30 tablet 0   scopolamine (TRANSDERM-SCOP) 1 MG/3DAYS Place 1 patch (1.5 mg total) onto the skin every 3 (three) days. 10 patch 12   YUVAFEM 10 MCG TABS vaginal tablet Place 10 mcg vaginally 2 (two) times a week.     citalopram (CELEXA) 10 MG tablet Take 1 tablet (10 mg total) by mouth daily. (Patient not taking: Reported on 04/14/2023) 90 tablet 1   LORazepam (ATIVAN) 1 MG tablet Take 1 tablet (1 mg total) by mouth every 8 (eight) hours. (Patient not taking: Reported on 04/14/2023) 30 tablet 0   naloxegol oxalate (MOVANTIK) 12.5 MG TABS tablet Take 1 tablet (12.5 mg total) by mouth daily. (Patient not taking: Reported on 04/14/2023) 30 tablet 0     Scheduled Meds:  Chlorhexidine Gluconate Cloth  6 each Topical Daily   morphine  45 mg Oral Q8H   sodium chloride flush  10-40 mL Intracatheter Q12H   Continuous Infusions:  cefTRIAXone (ROCEPHIN)  IV     dextrose 5 % and 0.9 % NaCl 100 mL/hr at 04/14/23 2001   metronidazole 500 mg (04/14/23 2122)   PRN Meds:.acetaminophen **OR** acetaminophen, HYDROmorphone (DILAUDID) injection, lip balm, ondansetron **OR** ondansetron (ZOFRAN) IV, sodium  chloride flush  Allergies:  Allergies  Allergen Reactions   Compazine [Prochlorperazine Edisylate]     Very agitated   Propofol Other (See Comments)    Headache   Oxycodone-Acetaminophen Itching and Other (See Comments)    Welts, too   Prochlorperazine    Labetalol Hives, Itching, Swelling and Rash    Family History  Adopted: Yes  Problem Relation Age of Onset   Breast cancer Mother        dx > 50   Heart attack Mother    Diabetes Father    Breast cancer Maternal Grandmother        ? <  50   Stroke Paternal Grandmother    Breast cancer Maternal Aunt        dx > 50   Breast cancer Maternal Aunt        dx > 50   Pancreatic cancer Maternal Uncle    Pancreatic cancer Cousin     Social History:  reports that she has never smoked. She has never used smokeless tobacco. She reports current alcohol use of about 1.0 standard drink of alcohol per week. She reports that she does not use drugs.  ROS: A complete review of systems was performed.  All systems are negative except for pertinent findings as noted.  Physical Exam:  Vital signs in last 24 hours: Temp:  [97.6 F (36.4 C)-98.3 F (36.8 C)] 98.2 F (36.8 C) (03/08 0519) Pulse Rate:  [87-110] 87 (03/08 0519) Resp:  [15-20] 16 (03/08 0519) BP: (123-171)/(78-100) 123/78 (03/08 0519) SpO2:  [96 %-100 %] 97 % (03/08 0519) Constitutional:  Alert and oriented, No acute distress Cardiovascular: Regular rate and rhythm Respiratory: Normal respiratory effort, Lungs clear bilaterally GI: Abdomen is soft, nontender, nondistended, no abdominal masses GU: No CVA tenderness Neurologic: Grossly intact, no focal deficits Psychiatric: Normal mood and affect  Laboratory Data:  Recent Labs    04/14/23 0812 04/15/23 0306  WBC 5.6 5.8  HGB 10.4* 8.0*  HCT 34.7* 27.1*  PLT 153 120*    Recent Labs    04/14/23 0812 04/15/23 0306  NA 136 138  K 3.1* 3.7  CL 100 105  GLUCOSE 105* 123*  BUN 11 11  CALCIUM 9.1 8.0*  CREATININE  1.10* 0.91     Results for orders placed or performed during the hospital encounter of 04/14/23 (from the past 24 hours)  Urinalysis, Routine w reflex microscopic -Urine, Clean Catch     Status: Abnormal   Collection Time: 04/14/23  9:39 AM  Result Value Ref Range   Color, Urine YELLOW YELLOW   APPearance CLEAR CLEAR   Specific Gravity, Urine 1.011 1.005 - 1.030   pH 5.0 5.0 - 8.0   Glucose, UA NEGATIVE NEGATIVE mg/dL   Hgb urine dipstick SMALL (A) NEGATIVE   Bilirubin Urine NEGATIVE NEGATIVE   Ketones, ur 20 (A) NEGATIVE mg/dL   Protein, ur NEGATIVE NEGATIVE mg/dL   Nitrite NEGATIVE NEGATIVE   Leukocytes,Ua NEGATIVE NEGATIVE   RBC / HPF 0-5 0 - 5 RBC/hpf   WBC, UA 0-5 0 - 5 WBC/hpf   Bacteria, UA NONE SEEN NONE SEEN   Squamous Epithelial / HPF 0-5 0 - 5 /HPF   Mucus PRESENT   CBC     Status: Abnormal   Collection Time: 04/15/23  3:06 AM  Result Value Ref Range   WBC 5.8 4.0 - 10.5 K/uL   RBC 2.90 (L) 3.87 - 5.11 MIL/uL   Hemoglobin 8.0 (L) 12.0 - 15.0 g/dL   HCT 16.1 (L) 09.6 - 04.5 %   MCV 93.4 80.0 - 100.0 fL   MCH 27.6 26.0 - 34.0 pg   MCHC 29.5 (L) 30.0 - 36.0 g/dL   RDW 40.9 (H) 81.1 - 91.4 %   Platelets 120 (L) 150 - 400 K/uL   nRBC 0.0 0.0 - 0.2 %  Comprehensive metabolic panel     Status: Abnormal   Collection Time: 04/15/23  3:06 AM  Result Value Ref Range   Sodium 138 135 - 145 mmol/L   Potassium 3.7 3.5 - 5.1 mmol/L   Chloride 105 98 - 111 mmol/L   CO2  26 22 - 32 mmol/L   Glucose, Bld 123 (H) 70 - 99 mg/dL   BUN 11 6 - 20 mg/dL   Creatinine, Ser 3.29 0.44 - 1.00 mg/dL   Calcium 8.0 (L) 8.9 - 10.3 mg/dL   Total Protein 6.6 6.5 - 8.1 g/dL   Albumin 3.0 (L) 3.5 - 5.0 g/dL   AST 14 (L) 15 - 41 U/L   ALT 8 0 - 44 U/L   Alkaline Phosphatase 64 38 - 126 U/L   Total Bilirubin 0.5 0.0 - 1.2 mg/dL   GFR, Estimated >51 >88 mL/min   Anion gap 7 5 - 15   No results found for this or any previous visit (from the past 240 hours).  Renal Function: Recent Labs     04/10/23 0423 04/14/23 0812 04/15/23 0306  CREATININE 1.06* 1.10* 0.91   Estimated Creatinine Clearance: 54.6 mL/min (by C-G formula based on SCr of 0.91 mg/dL).  Radiologic Imaging: CT ABDOMEN PELVIS W CONTRAST Result Date: 04/14/2023 CLINICAL DATA:  Acute generalized abdominal pain. History of pancreatic cancer. EXAM: CT ABDOMEN AND PELVIS WITH CONTRAST TECHNIQUE: Multidetector CT imaging of the abdomen and pelvis was performed using the standard protocol following bolus administration of intravenous contrast. RADIATION DOSE REDUCTION: This exam was performed according to the departmental dose-optimization program which includes automated exposure control, adjustment of the mA and/or kV according to patient size and/or use of iterative reconstruction technique. CONTRAST:  OMNIPAQUE IOHEXOL 300 MG/ML  SOLN COMPARISON:  March 20, 2023. FINDINGS: Lower chest: No acute abnormality. Hepatobiliary: No cholelithiasis or biliary dilatation is noted. Multiple hepatic lesions are again noted consistent with metastatic disease. The largest measures 2.8 x 2.2 cm in posterior segment of right hepatic lobe which is enlarged compared to prior exam. 2.6 x 2.6 cm lesion is noted in superior portion of right hepatic lobe which is enlarged compared to prior exam as well. Pancreas: 3.5 x 2.3 cm pancreatic head mass is noted consistent with malignancy. Mild dilatation of common bile duct is noted. 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 It also seems to surround the proximal portion of the superior mesenteric artery, but it remains patent. Spleen: Normal in size without focal abnormality. Adrenals/Urinary Tract: Adrenal glands are unremarkable. Decreased enhancement of left kidney is noted. Moderate bilateral hydroureteronephrosis is noted without evidence of obstructing calculus, concerning for distal ureteral occlusion. Urinary bladder is  only minimally distended. Stomach/Bowel: The stomach is unremarkable. There is no evidence of small bowel dilatation. Large amount of stool and fluid is seen throughout the colon. The appendix appears normal. There appears to be moderate to severe wall thickening of the rectum and possibly distal sigmoid colon suggesting inflammation or possibly malignancy. There are noted probable enhancing soft tissue lesions in this area suggesting peritoneal carcinomatosis. Vascular/Lymphatic: Abdominal aorta is unremarkable. 1.4 cm right external iliac lymph node is noted concerning for metastatic disease. 8 mm left periaortic lymph node is noted. 9 mm right periaortic lymph node is noted as well. Reproductive: Status post hysterectomy. No adnexal masses. Other: No ascites or hernia is noted. Musculoskeletal: No acute or significant osseous findings. IMPRESSION: There is interval development of moderate bilateral hydroureteronephrosis without evidence of obstructing calculus. This most likely is due to obstruction of distal ureters due to external compression, most likely due to possible peritoneal implants or other metastatic disease in the pelvis. There is noted severe wall thickening of the rectum and probable distal  sigmoid colon due to either inflammation or metastatic disease. Hepatic metastatic lesions are again noted which appear to be increased in size compared to prior exam. 3.5 x 2.3 cm pancreatic head mass is noted consistent with malignancy. 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 This mass also appears to encase the superior mesenteric artery, but the artery remains patent. Retroperitoneal and right external iliac adenopathy is noted consistent with metastatic disease. Electronically Signed   By: Lupita Raider M.D.   On: 04/14/2023 12:41   DG Abdomen 1 View Result Date: 04/14/2023 CLINICAL DATA:  Abdominal pain and constipation EXAM:  ABDOMEN - 1 VIEW COMPARISON:  CT abdomen and pelvis dated 03/20/2023 FINDINGS: Nonobstructive bowel gas pattern. No pneumatosis. Large volume stool throughout the colon. No abnormal radio-opaque calculi or mass effect. No acute or substantial osseous abnormality. The sacrum and coccyx are partially obscured by overlying bowel contents. IMPRESSION: Large volume stool throughout the colon. Electronically Signed   By: Agustin Cree M.D.   On: 04/14/2023 10:01    I independently reviewed the above imaging studies.  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 Of note, has had 3x BM VS x 5 over past 24 hours Creatinine downtrending, 0.91 today.   Recommendations : -discussed utility of neph tubes versus ureteral stenting, at this time, reasonable to defer given overall prognosis, lack of flank pain, and downtrending creatinine. Pt in agreement, and would like to not have any procedure that is not necessary -can continue trend of creatinine and reconsider decompression if worsening AKI or new worsening pain -urology will follow PRN  Roby Lofts, MD Resident Physician [facility]  04/15/2023, 9:09 AM  [facility]  Pager: [phone]  I have seen and examined the patient and agree with the above assessment and plan.  As above, recommend observation for now. If develops flank pain or AKI, will consider PCNs. Following peripherally. Please call with questions.  Matt R. Leanza Shepperson MD [facility]  Pager: [phone]

## 2023-04-15 NOTE — Assessment & Plan Note (Signed)
 Evidence of chemotherapy failure and substantial progression of metastatic pancreatic cancer on CT imaging with multiple developing complications including bilateral hydronephrosis as well as moderate to severe wall thickening of the rectum and distal sigmoid colon suggesting possibly malignancy. Patient typically follows with Dr. Leonides Schanz in the outpatient setting. I have discussed the case thoroughly with Dr. Bertis Ruddy who has evaluated the patient at the bedside today in consultation, her input is appreciated. Fortunately the patient is not a candidate for further chemotherapies.  Overall prognosis is quite poor  Palliative care has additionally been consulted to assist with goals of care discussions.

## 2023-04-15 NOTE — Assessment & Plan Note (Signed)
 Large amount of stool seen throughout the colon, likely secondary to progressive tumor burden and ongoing opiate therapy Oncology has initiated patient on regimen of both senna and lactulose 3 times daily, will monitor for response

## 2023-04-15 NOTE — Assessment & Plan Note (Signed)
 Normocytic anemia and thrombocytopenia likely secondary to recent chemotherapy No clinical evidence of bleeding Iron panel, ferritin, folate, vitamin B12 Monitoring counts with serial CBCs

## 2023-04-15 NOTE — Assessment & Plan Note (Signed)
 Protonix daily

## 2023-04-15 NOTE — Assessment & Plan Note (Signed)
 Patient experiencing severe bilateral flank and lower abdominal pain that has been progressively worsening over the past week Progressively worsening pain is felt to be secondary to progressive tumor burden in the pelvis with possible contribution of progressively worsening bilateral hydronephrosis High-dose opiate therapy has recently been titrated and patient is currently on MS Contin 60 mg every 8 hours Patient is additionally being provided with as needed intravenous Dilaudid for severe pain Palliative care has been consulted to assist with pain management, their input is appreciated After lengthy discussion with palliative care and the patient, we feel that the patient may benefit from celiac plexus block as a means to reduce her reliance on opiate therapy.  I have discussed the case with interventional radiology and placed an order and this is likely to be done on Monday

## 2023-04-15 NOTE — Progress Notes (Signed)
 Kaitlin Ferrell   DOB:Nov 09, 1965   ZO#:[id number]    ASSESSMENT & PLAN:  Metastatic pancreatic cancer to the liver, lymph nodes and peritoneal carcinomatosis The patient is refractory to FOLFIRINOX and gem/Abraxane chemotherapy Review of recent oncology outpatient follow-up notes indicated that the patient is currently on palliative measures only She has not received any chemotherapy since a month ago CT imaging from 04/14/2023 was reviewed which show slight disease progression but most importantly, signs of new bilateral hydronephrosis Continue supportive care Appreciate palliative care consult   Cancer associated pain Her pain is rated at around 5 out of 10 pain but still at an unacceptable level despite increased dose of pain medicine We discussed risk and benefits of PCA pump while awaiting for palliative care consult and she agreed I think down the road, consideration for celiac axis block could be an option for her for long term pain control  Severe constipation Significant stool burden noted on CT imaging We discussed aggressive laxative therapy and she agreed  New onset bilateral hydronephrosis This is new in comparison from CT imaging dated March 20, 2023 In a short span of less than a month, her disease progression has caused bilateral hydronephrosis I recommend intervention now rather than later as the patient would not be a candidate for hemodialysis if and when she progress into renal failure We discussed risk and benefits of internal stent placement versus percutaneous bilateral nephrostomy tube placement I have reviewed urology consult We discussed the timing of her procedure and the patient is willing to undergo the procedure while she is hospitalized in order to save her kidneys from going to failure I discussed this with hospitalist team who will then discuss the recommendation with urologist Even though she is not in frank renal failure, the estimated GFR could  be inaccurate.  The patient is somewhat cachectic and will not likely generate high creatinine  Acquired pancytopenia Multifactorial, could be related to recent chemotherapy as well as poor nutritional status Observe closely for now  Discharge planning and goals of care With address the goals of this admission The patient would like her pain reasonably controlled, improvement of constipation as well as intervention for new onset bilateral hydronephrosis In terms of her prognosis, she appears relatively healthy and I think her survival is still measuring months and she could certainly benefit from these interventions  [name], MD 04/15/2023 1:26 PM  Subjective:  I was consulted regarding management of this patient who was found to have new onset of bilateral hydronephrosis, on background history of metastatic pancreatic cancer.  She is a patient that follows with Dr. Leonides Schanz in the outpatient clinic.  I reviewed her records extensively.  I reviewed her CBC, CMP as well as CT imaging from last night I discussed my recommendation with the patient and her husband over the phone She has mild intermittent nausea She is profoundly constipated despite regular doses of Senokot and magnesium citrate at home, along with Fleet enema suppositories She has chronic pain but recently, more severe lower pelvic pain as well as bilateral flank pain that appears to be new and progressive since her recent outpatient follow-up  Objective:  Vitals:   04/15/23 0519 04/15/23 0958  BP: 123/78 123/78  Pulse: 87 87  Resp: 16 16  Temp: 98.2 F (36.8 C) 99 F (37.2 C)  SpO2: 97% 100%     Intake/Output Summary (Last 24 hours) at 04/15/2023 1326 Last data filed at 04/15/2023 0600 Gross per 24 hour  Intake 1292.66  ml  Output --  Net 1292.66 ml

## 2023-04-15 NOTE — Assessment & Plan Note (Signed)
See assessment and plan above

## 2023-04-15 NOTE — Assessment & Plan Note (Signed)
·   Replacing with potassium chloride °· Evaluating for concurrent hypomagnesemia  °· Monitoring potassium levels with serial chemistries. ° °

## 2023-04-15 NOTE — Hospital Course (Addendum)
 58 y.o. female with medical history significant of metastatic adenocardinoma of the pancreas (Dx 03/22/2022, follows with Dr. Leonides Schanz,  on FOLFIRINOX q2weeks), hypertension, GERD, portal vein occlusion and right upper extremity DVT (05/2022 on Eliquis)  who presented to the emergency department complaints of progressively worsening abdominal pain associated with constipation for the past week that has not responded to mag citrate, Fleet enema and laxatives.   Of note, the patient has been receiving increasing doses of Dilaudid 2-4mg  Q4hrs as needed as well as MS Contin 60 mg every 8 hours for worsening pain.  Workup for UTI in the past week has been negative.   Upon evaluation in the emergency department, CT abdomen/pelvis with contrast showing interval development of moderate bilateral hydroureteronephrosis, hepatic metastatic lesions that appear to be increasing in size, severe wall thickening of the likely distal sigmoid thought to be due to either inflammation or metastatic disease and redemonstration of the 3.5 x 2.3 cm pancreatic head mass that appears to be causing occlusion of the superior mesenteric vein with collateral flow to the splenic vein.  Dr. Cardell Peach with urology was consulted due to the bilateral hydronephrosis.  The hospitalist group was then called to assess the patient for admission to the hospital.  After reviewing patient's hydronephrosis with both Dr. Bertis Ruddy with Nephrology and Urology, the decision was made to proceed with bilateral nephrostomy tubes due to anticipated progression of pelvic metastases and inevitable postobstructive uropathy as a result.  This is anticipated to be performed on 3/10 by IR.  Concerning patient's advanced malignancy and intractable pain, or such with oncology and Dr. Linna Darner with palliative care were consulted.  To attempt to improve pain management, patient wishes to pursue a celiac plexus block.  This is to be performed by IR on 3/10

## 2023-04-15 NOTE — Assessment & Plan Note (Signed)
 Substantial bilateral hydronephrosis, felt to be secondary to increasing mass effect due to tumor burden in the pelvis Case discussed with Dr. Cardell Peach with urology, patient is felt to not be able to benefit from ureteral stents with bilateral nephrostomy tubes likely being the only means of diverting urine output and preserving renal function Patient was originally apprehensive about proceeding with nephrostomy tubes but after a follow-up discussion with both myself and Dr. Bertis Ruddy with oncology the patient is now agreeable Nephrostomy tubes discussed with interventional radiology and orders have been placed.  Patient is likely to proceed with bilateral nephrostomy tube placement on Monday.

## 2023-04-15 NOTE — Plan of Care (Signed)
   Problem: Clinical Measurements: Goal: Diagnostic test results will improve Outcome: Progressing   Problem: Activity: Goal: Risk for activity intolerance will decrease Outcome: Progressing

## 2023-04-16 ENCOUNTER — Encounter (HOSPITAL_COMMUNITY): Payer: Self-pay | Admitting: Student

## 2023-04-16 DIAGNOSIS — N1330 Unspecified hydronephrosis: Secondary | ICD-10-CM | POA: Diagnosis not present

## 2023-04-16 DIAGNOSIS — K219 Gastro-esophageal reflux disease without esophagitis: Secondary | ICD-10-CM | POA: Diagnosis not present

## 2023-04-16 DIAGNOSIS — R109 Unspecified abdominal pain: Secondary | ICD-10-CM | POA: Diagnosis not present

## 2023-04-16 DIAGNOSIS — K5903 Drug induced constipation: Secondary | ICD-10-CM | POA: Diagnosis not present

## 2023-04-16 LAB — COMPREHENSIVE METABOLIC PANEL
ALT: 8 U/L (ref 0–44)
AST: 13 U/L — ABNORMAL LOW (ref 15–41)
Albumin: 2.9 g/dL — ABNORMAL LOW (ref 3.5–5.0)
Alkaline Phosphatase: 62 U/L (ref 38–126)
Anion gap: 6 (ref 5–15)
BUN: 9 mg/dL (ref 6–20)
CO2: 26 mmol/L (ref 22–32)
Calcium: 8.3 mg/dL — ABNORMAL LOW (ref 8.9–10.3)
Chloride: 107 mmol/L (ref 98–111)
Creatinine, Ser: 1 mg/dL (ref 0.44–1.00)
GFR, Estimated: >60 mL/min (ref >60)
Glucose, Bld: 97 mg/dL (ref 70–99)
Potassium: 3.8 mmol/L (ref 3.5–5.1)
Sodium: 139 mmol/L (ref 135–145)
Total Bilirubin: 0.7 mg/dL (ref 0.0–1.2)
Total Protein: 6.3 g/dL — ABNORMAL LOW (ref 6.5–8.1)

## 2023-04-16 LAB — FERRITIN: Ferritin: 70 ng/mL (ref 11–307)

## 2023-04-16 LAB — CBC WITH DIFFERENTIAL/PLATELET
Abs Immature Granulocytes: 0.02 10*3/uL (ref 0.00–0.07)
Basophils Absolute: 0 10*3/uL (ref 0.0–0.1)
Basophils Relative: 1 %
Eosinophils Absolute: 0.2 10*3/uL (ref 0.0–0.5)
Eosinophils Relative: 4 %
HCT: 27 % — ABNORMAL LOW (ref 36.0–46.0)
Hemoglobin: 7.9 g/dL — ABNORMAL LOW (ref 12.0–15.0)
Immature Granulocytes: 0 %
Lymphocytes Relative: 17 %
Lymphs Abs: 0.9 10*3/uL (ref 0.7–4.0)
MCH: 27.6 pg (ref 26.0–34.0)
MCHC: 29.3 g/dL — ABNORMAL LOW (ref 30.0–36.0)
MCV: 94.4 fL (ref 80.0–100.0)
Monocytes Absolute: 0.9 10*3/uL (ref 0.1–1.0)
Monocytes Relative: 17 %
Neutro Abs: 3.1 10*3/uL (ref 1.7–7.7)
Neutrophils Relative %: 61 %
Platelets: 130 10*3/uL — ABNORMAL LOW (ref 150–400)
RBC: 2.86 MIL/uL — ABNORMAL LOW (ref 3.87–5.11)
RDW: 17.2 % — ABNORMAL HIGH (ref 11.5–15.5)
WBC: 5.1 10*3/uL (ref 4.0–10.5)
nRBC: 0 % (ref 0.0–0.2)

## 2023-04-16 LAB — IRON AND TIBC
Iron: 29 ug/dL (ref 28–170)
Saturation Ratios: 18 % (ref 10.4–31.8)
TIBC: 158 ug/dL — ABNORMAL LOW (ref 250–450)
UIBC: 129 ug/dL

## 2023-04-16 LAB — MAGNESIUM: Magnesium: 2.4 mg/dL (ref 1.7–2.4)

## 2023-04-16 LAB — FOLATE: Folate: 8.9 ng/mL (ref >5.9)

## 2023-04-16 LAB — VITAMIN B12: Vitamin B-12: 2484 pg/mL — ABNORMAL HIGH (ref 180–914)

## 2023-04-16 MED ORDER — HYDROMORPHONE HCL 1 MG/ML IJ SOLN: 1.0000 mg | INTRAMUSCULAR | Status: DC | PRN

## 2023-04-16 MED ORDER — HYDROMORPHONE HCL 1 MG/ML IJ SOLN
2.0000 mg | INTRAMUSCULAR | Status: DC | PRN
  Administered 2023-04-16 – 2023-04-18 (×8): 2 mg via INTRAVENOUS
  Filled 2023-04-16 (×8): qty 2

## 2023-04-16 NOTE — Assessment & Plan Note (Signed)
See assessment and plan above

## 2023-04-16 NOTE — Assessment & Plan Note (Signed)
 Substantial bilateral hydronephrosis, felt to be secondary to increasing mass effect due to tumor burden in the pelvis Case discussed with Dr. Cardell Peach with urology, patient is felt to not be able to benefit from ureteral stents with bilateral nephrostomy tubes likely being the only means of diverting urine output and preserving renal function Patient was originally apprehensive about proceeding with nephrostomy tubes but after a follow-up discussion with both myself and Dr. Bertis Ruddy with oncology the patient is now agreeable Nephrostomy tubes discussed with interventional radiology and orders have been placed.  Patient is likely to proceed with bilateral nephrostomy tube placement on Monday.

## 2023-04-16 NOTE — Consult Note (Signed)
 Chief Complaint: Patient was seen in consultation today for intractable upper abdominal pain, bilateral hydronephrosis  Chief Complaint  Patient presents with   Abdominal Pain   Constipation   at the request of Shauna Hugh   Referring Physician(s): Shauna Hugh  Supervising Physician: Gilmer Mor  Patient Status: [facility] - In-pt  History of Present Illness: Kaitlin Ferrell is a 58 y.o. 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   Patient is known to IR for right port placement, liver lesion bx, right port repair and left port placement.   Patient has a hx of metastatic pancreatic CA that has not been responding to chemo well, she came to ED on 3/7 with uncontrolled abdominal pain and she was admitted for pain control. CT in ED showed interval development of moderate bilateral hydroureteronephrosis without evidence of obstructing calculus which is most likely caused by possible peritoneal implants or other metastatic disease in the pelvis. Urology was consulted, after discussion with the patient on 3/8, mutual decision was made to not proceed with any procedure as patient's renal function is normal.   IR was consulted for celiac plexus block by oncology for pancreatic pain control on 3/8, later the same day IR was s reached out by [facility] team who discussed with oncology team and the patient regarding bilateral PCN placement, per [facility] patient is now in agreement for bilateral PCN placement to reserve her kidney function. Oncology is on board as well. Case was reviewed and approved by Dr. Loreta Ave.   Patient seen in room, she is sitting in bed, husband at bedside.  She is not in acute distress but moving back and forward due to flank pain.  Patient has been having BM.  [name] headache, fever, chills, shortness of  breath, cough, chest pain, and bleeding.  Both celiac plexus block and PCN placement was discussed in detail, patient was informed that the first step for the block is just numbing medicine, and if she receives pain relief after the procedure she should be a candidate for complete neurolysis. Patient was also informed that IR can offer internalization of the PCN approximally 2 weeks after the PCN placement. All questions answered to patient and her husband's satisfaction.     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   treated with 4 cycles of actinomycin D from 12-02-11 thru 01-13-12   H/O molar pregnancy, antepartum    Hypertension    Migraine    Miscarriage     Past Surgical History:  Procedure Laterality Date   BREAST BIOPSY  2001   Left   BREAST LUMPECTOMY     DILATION AND CURETTAGE OF UTERUS  1992, 09/2011   DILATION AND EVACUATION  2011   INSERTION OF MESH N/A 09/03/2013   Procedure: INSERTION OF MESH;  Surgeon: Ardeth Sportsman, MD;  Location: [facility];  Service: General;  Laterality: N/A;   IR CHEST FLUORO  04/14/2022   IR IMAGING GUIDED PORT INSERTION  03/22/2022   IR IMAGING GUIDED PORT INSERTION  05/02/2022   IR PORT REPAIR CENTRAL VENOUS ACCESS DEVICE  04/19/2022   IR REMOVAL TUN CV CATH W/O FL  05/02/2022   IR US LIVER BIOPSY  03/22/2022   PORTACATH PLACEMENT  11/2011   PORTACATH PLACEMENT     REMOVAL  MARCH 2014   VENTRAL HERNIA REPAIR N/A 09/03/2013   Procedure: LAPAROSCOPIC VENTRAL WALL HERNIA REPAIR;  Surgeon: Ardeth Sportsman, MD;  Location: [facility];  Service: General;  Laterality: N/A;   WISDOM TOOTH EXTRACTION     in 11th grade    Allergies: Compazine [prochlorperazine edisylate], Propofol, Oxycodone-acetaminophen, Prochlorperazine, and Labetalol  Medications: Prior to Admission medications    Medication Sig Start Date End Date Taking? Authorizing Provider  acetaminophen (TYLENOL) 500 MG tablet Take 1,000 mg by mouth every 6 (six) hours as needed for mild pain (pain score 1-3) or headache.   Yes [provider]  eletriptan (RELPAX) 40 MG tablet Take 1 tablet (40 mg total) by mouth as needed for migraine or headache. May repeat in 2 hours if needed. Patient taking differently: Take 40 mg by mouth as needed for migraine or headache (and may repeat in 2 hours, if no relief). 09/22/14  Yes Nilda Riggs, NP  HYDROmorphone (DILAUDID) 2 MG tablet Take 1 tablet (2 mg total) by mouth every 4 (four) hours as needed for severe pain (pain score 7-10). 04/10/23  Yes Pickenpack-Cousar, Arty Baumgartner, NP  lidocaine-prilocaine (EMLA) cream Apply 1 Application topically as needed. Patient taking differently: Apply 1 Application topically as needed (for port access). 09/29/22  Yes Pickenpack-Cousar, Arty Baumgartner, NP  LORazepam (ATIVAN) 0.5 MG tablet Take 0.5 mg by mouth daily as needed for anxiety. 03/22/22  Yes [provider]  magnesium oxide (MAG-OX) 400 (241.3 MG) MG tablet TAKE 1 TABLET BY MOUTH TWICE A DAY 11/16/14  Yes Nilda Riggs, NP  morphine (MS CONTIN) 15 MG 12 hr tablet Take 1 tablet (15 mg total) by mouth every 8 (eight) hours. Patient taking differently: Take 30 mg by mouth See admin instructions. Take 30 mg by mouth at 6 AM, 2 PM, and 10 PM in conjunction with ONE 30 mg tablet to equal a total dose of 60 mg 04/13/23  Yes Pickenpack-Cousar, Athena N, NP  morphine (MS CONTIN) 30 MG 12 hr tablet Take 1 tablet (30 mg total) by mouth every 8 (eight) hours. Patient taking differently: Take 30 mg by mouth See admin instructions. Take 30 mg by mouth at 6 AM, 2 PM, and 10 PM in conjunction with TWO 15 mg tablets to equal a total dose of 60 mg 04/13/23  Yes Pickenpack-Cousar, Athena N, NP  OLANZapine (ZYPREXA) 10 MG tablet TAKE 1 TABLET BY MOUTH EVERYDAY AT BEDTIME Patient taking  differently: Take 10 mg by mouth at bedtime. 04/20/22  Yes Georga Kaufmann T, PA-C  ondansetron (ZOFRAN) 8 MG tablet Take 1 tablet (8 mg total) by mouth every 8 (eight) hours as needed for nausea or vomiting. 03/14/22  Yes Georga Kaufmann T, PA-C  ondansetron (ZOFRAN-ODT) 8 MG disintegrating tablet Take 1 tablet (8 mg total) by mouth every 8 (eight) hours as needed for nausea or vomiting. Patient taking differently: Take 8 mg by mouth every 8 (eight) hours as needed for nausea or vomiting (dissolve orally). 05/17/22  Yes Walisiewicz, Kaitlyn E, PA-C  pantoprazole (PROTONIX) 40 MG tablet Take 40 mg by mouth daily as needed (for GERD-like symptoms). 03/17/23  Yes [provider]  promethazine (PHENERGAN) 25 MG tablet Take 1 tablet (25 mg total) by mouth every 6 (six) hours as needed for nausea or vomiting. 05/04/22  Yes Jaci Standard, MD  scopolamine (TRANSDERM-SCOP) 1 MG/3DAYS Place 1 patch (  1.5 mg total) onto the skin every 3 (three) days. 07/22/22  Yes Georga Kaufmann T, PA-C  YUVAFEM 10 MCG TABS vaginal tablet Place 10 mcg vaginally 2 (two) times a week. 03/03/23  Yes [provider]  citalopram (CELEXA) 10 MG tablet Take 1 tablet (10 mg total) by mouth daily. Patient not taking: Reported on 04/14/2023 04/11/23   Bernadette Hoit, MD  LORazepam (ATIVAN) 1 MG tablet Take 1 tablet (1 mg total) by mouth every 8 (eight) hours. Patient not taking: Reported on 04/14/2023 04/11/23   Bernadette Hoit, MD  naloxegol oxalate (MOVANTIK) 12.5 MG TABS tablet Take 1 tablet (12.5 mg total) by mouth daily. Patient not taking: Reported on 04/14/2023 04/13/23   Pickenpack-Cousar, Arty Baumgartner, NP     Family History  Adopted: Yes  Problem Relation Age of Onset   Breast cancer Mother        dx > 50   Heart attack Mother    Diabetes Father    Breast cancer Maternal Grandmother        ? < 50   Stroke Paternal Grandmother    Breast cancer Maternal Aunt        dx > 50   Breast cancer Maternal Aunt        dx > 50    Pancreatic cancer Maternal Uncle    Pancreatic cancer Cousin     Social History   Socioeconomic History   Marital status: Married    Spouse name: [name]   Number of children: 2   Years of education: college   Highest education level: Not on file  Occupational History    Comment: [employer]  Tobacco Use   Smoking status: Never   Smokeless tobacco: Never  Substance and Sexual Activity   Alcohol use: Yes    Alcohol/week: 1.0 standard drink of alcohol    Types: 1 Glasses of wine per week    Comment: occas   Drug use: No   Sexual activity: Not Currently    Birth control/protection: Pill  Other Topics Concern   Not on file  Social History Narrative   Patient is married [name]). Patient works part time at [employer].   Right handed.   Patient has her CMA.   Caffeine- None         Social Drivers of Corporate investment banker Strain: Not on file  Food Insecurity: No Food Insecurity (04/15/2023)   Hunger Vital Sign    Worried About Running Out of Food in the Last Year: Never true    Ran Out of Food in the Last Year: Never true  Transportation Needs: No Transportation Needs (04/15/2023)   PRAPARE - Administrator, Civil Service (Medical): No    Lack of Transportation (Non-Medical): No  Physical Activity: Not on file  Stress: Not on file  Social Connections: Not on file     Review of Systems: A 12 point ROS discussed and pertinent positives are indicated in the HPI above.  All other systems are negative.  Vital Signs: BP 135/82 (BP Location: Right Arm)   Pulse 77   Temp 98.2 F (36.8 C) (Oral)   Resp 16   Ht 5\' 2"  (1.575 m)   Wt 116 lb (52.6 kg)   LMP 06/26/2013 Comment: low dose BCP  SpO2 97%   BMI 21.22 kg/m    Physical Exam Vitals reviewed.  Constitutional:      General: She is not in acute distress.  Appearance: She is not ill-appearing.  HENT:     Head: Normocephalic and atraumatic.  Cardiovascular:     Rate and Rhythm: Normal rate and  regular rhythm.     Heart sounds: Normal heart sounds.  Pulmonary:     Effort: Pulmonary effort is normal.     Breath sounds: Normal breath sounds.  Abdominal:     General: Abdomen is flat.     Palpations: Abdomen is soft.  Skin:    General: Skin is warm and dry.     Coloration: Skin is not cyanotic or jaundiced.     Comments: + port in left upper chest   Neurological:     Mental Status: She is alert and oriented to person, place, and time.  Psychiatric:        Mood and Affect: Mood normal.        Behavior: Behavior normal.     MD Evaluation Airway: WNL Heart: WNL Abdomen: WNL Chest/ Lungs: WNL ASA  Classification: 3 Mallampati/Airway Score: Two  Imaging: CT ABDOMEN PELVIS W CONTRAST Result Date: 04/14/2023 CLINICAL DATA:  Acute generalized abdominal pain. History of pancreatic cancer. EXAM: CT ABDOMEN AND PELVIS WITH CONTRAST TECHNIQUE: Multidetector CT imaging of the abdomen and pelvis was performed using the standard protocol following bolus administration of intravenous contrast. RADIATION DOSE REDUCTION: This exam was performed according to the departmental dose-optimization program which includes automated exposure control, adjustment of the mA and/or kV according to patient size and/or use of iterative reconstruction technique. CONTRAST:  OMNIPAQUE IOHEXOL 300 MG/ML  SOLN COMPARISON:  March 20, 2023. FINDINGS: Lower chest: No acute abnormality. Hepatobiliary: No cholelithiasis or biliary dilatation is noted. Multiple hepatic lesions are again noted consistent with metastatic disease. The largest measures 2.8 x 2.2 cm in posterior segment of right hepatic lobe which is enlarged compared to prior exam. 2.6 x 2.6 cm lesion is noted in superior portion of right hepatic lobe which is enlarged compared to prior exam as well. Pancreas: 3.5 x 2.3 cm pancreatic head mass is noted consistent with malignancy. Mild dilatation of common bile duct is noted. This mass appears to encase  and occludes the superior mesenteric vein near its insertion with the portal vein. It also seems to surround the proximal portion of the superior mesenteric artery, but it remains patent. Spleen: Normal in size without focal abnormality. Adrenals/Urinary Tract: Adrenal glands are unremarkable. Decreased enhancement of left kidney is noted. Moderate bilateral hydroureteronephrosis is noted without evidence of obstructing calculus, concerning for distal ureteral occlusion. Urinary bladder is only minimally distended. Stomach/Bowel: The stomach is unremarkable. There is no evidence of small bowel dilatation. Large amount of stool and fluid is seen throughout the colon. The appendix appears normal. There appears to be moderate to severe wall thickening of the rectum and possibly distal sigmoid colon suggesting inflammation or possibly malignancy. There are noted probable enhancing soft tissue lesions in this area suggesting peritoneal carcinomatosis. Vascular/Lymphatic: Abdominal aorta is unremarkable. 1.4 cm right external iliac lymph node is noted concerning for metastatic disease. 8 mm left periaortic lymph node is noted. 9 mm right periaortic lymph node is noted as well. Reproductive: Status post hysterectomy. No adnexal masses. Other: No ascites or hernia is noted. Musculoskeletal: No acute or significant osseous findings. IMPRESSION: There is interval development of moderate bilateral hydroureteronephrosis without evidence of obstructing calculus. This most likely is due to obstruction of distal ureters due to external compression, most likely due to possible peritoneal implants or other metastatic disease in the  pelvis. There is noted severe wall thickening of the rectum and probable distal sigmoid colon due to either inflammation or metastatic disease. Hepatic metastatic lesions are again noted which appear to be increased in size compared to prior exam. 3.5 x 2.3 cm pancreatic head mass is noted consistent with  malignancy. 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 This mass also appears to encase the superior mesenteric artery, but the artery remains patent. Retroperitoneal and right external iliac adenopathy is noted consistent with metastatic disease. Electronically Signed   By: Lupita Raider M.D.   On: 04/14/2023 12:41   DG Abdomen 1 View Result Date: 04/14/2023 CLINICAL DATA:  Abdominal pain and constipation EXAM: ABDOMEN - 1 VIEW COMPARISON:  CT abdomen and pelvis dated 03/20/2023 FINDINGS: Nonobstructive bowel gas pattern. No pneumatosis. Large volume stool throughout the colon. No abnormal radio-opaque calculi or mass effect. No acute or substantial osseous abnormality. The sacrum and coccyx are partially obscured by overlying bowel contents. IMPRESSION: Large volume stool throughout the colon. Electronically Signed   By: Agustin Cree M.D.   On: 04/14/2023 10:01   CT CHEST ABDOMEN PELVIS W CONTRAST Result Date: 03/23/2023 CLINICAL DATA:  Pancreatic adenocarcinoma restaging. * Tracking Code: BO * EXAM: CT CHEST, ABDOMEN, AND PELVIS WITH CONTRAST TECHNIQUE: Multidetector CT imaging of the chest, abdomen and pelvis was performed following the standard protocol during bolus administration of intravenous contrast. RADIATION DOSE REDUCTION: This exam was performed according to the departmental dose-optimization program which includes automated exposure control, adjustment of the mA and/or kV according to patient size and/or use of iterative reconstruction technique. CONTRAST:  OMNIPAQUE IOHEXOL 300 MG/ML  SOLN COMPARISON:  CT 01/20/2023 and older. FINDINGS: CT CHEST FINDINGS Cardiovascular: Left upper chest port in place. Tip extending into the right atrium. Heart is nonenlarged. No pericardial effusion. The thoracic aorta is normal course and caliber with slight atherosclerotic plaque. Calcified aorta. Bovine type aortic arch.  Mediastinum/Nodes: No specific abnormal lymph node enlargement present in the axillary regions, hilum or mediastinum. Normal caliber thoracic esophagus. Preserved thyroid gland. Lungs/Pleura: Bilateral apical pleural thickening identified. No consolidation, pneumothorax or effusion. Punctate calcifications with reticular changes along the lateral right lower lobe is unchanged from previous and possibly sequela of old infectious or inflammatory process. Musculoskeletal: Mild degenerative changes along the spine. A stable area of sclerosis along the T9 vertebral body. CT ABDOMEN PELVIS FINDINGS Hepatobiliary: Liver metastases are again seen. These are scattered throughout the liver. Specific lesions will be followed for continuity. Example peripheral right lower lobe measuring 15 x 15 mm previously, today on series 2, image 36 measures 2.1 x 2.0 cm. Segment 4A lesion which previously measured 10 x 7 mm, today is similar on series 7, image 80. However lobe lateral to this is a larger lesion which previously measured 15 x 11 mm and today on series 7, image 81 18 x 17 mm. Other lesions are also increased in size. Patent portal vein. Gallbladder is nondilated. Pancreas: Slight pancreatic duct ectasia. Relatively preserved pancreatic parenchyma. Along the inferior aspect however of the pancreatic head and uncinate process is a locally infiltrative mass. Previously this was measured at 4.1 x 3.0 cm. When remeasured today using the same technique this mass encasing the SMA on series 7, image 105 measures 4.0 x 3.3 cm. Similar. Measurements are difficult due to the nature of the lesion and location. Once again there is occlusion of the portal venous confluence with  several mid and left upper quadrant abdominal collateral vessels. Spleen: Normal in size without focal abnormality. Adrenals/Urinary Tract: Adrenal glands are unremarkable. Kidneys are normal, without renal calculi, focal lesion, or hydronephrosis. Bladder is  underdistended but has some wall thickening. Adjacent stranding. Stomach/Bowel: On this non oral contrast exam the stomach is nondilated. Small bowel is nondilated. There are loops of small bowel stool appearance in the pelvis, nonspecific. The cecum resides in the anterior low pelvis just above the bladder. Diffuse colonic stool identified. There is presence of wall thickening with some stranding along the rectosigmoid colon which is new from previous. There is significant perirectal fat and presacral space stranding. Edema tracks along the pelvic sidewalls as well. Edema tracks along the lower abdominal retroperitoneum. Vascular/Lymphatic: Normal caliber aorta and IVC. Once again there are several abnormal nodes previously identified. These include a aortocaval node which previously had a short axis of 7 mm and today on series 7, image 114 measures 9 mm. Left common iliac chain node which previously had short axis of 11 mm, today measures 11 mm on image 135. Left pelvic sidewall node short axis of 11 mm, today measures 15 mm. This is larger but the node is more low in density diffusely. New right-sided pelvic sidewall node measures 15 mm on series 7, image 151. Nodes in the right hemipelvis are also more low in density. Additional retrocaval node on series 2, image 45 measures 10 mm in short axis today. Previously this node measured 4 mm in short axis. Reproductive: Status post hysterectomy. No adnexal masses. Other: Anasarca.  Trace ascites.  No obvious free air. Musculoskeletal: Mild degenerative changes along the spine. Degenerative changes along the pelvis. Stable sclerotic areas involving the spine at L2. IMPRESSION: Mass lesion extending along the inferior aspect of the pancreatic head and uncinate process is overall similar to previous. 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 Multiple collaterals identified. Multiple liver lesions are again seen.  There are several which have increased in size from previous close with progression. Several abnormal lymph nodes identified previously the retroperitoneum and in the pelvis have increased in size. However many are lower in density and there is more adjacent stranding particularly in the pelvis. Extensive edema along the pelvis including the pelvic sidewalls but also tracking along the perirectal and presacral spaces. There is significant wall thickening along the rectosigmoid colon. Please correlate for a colitis or other process. No new mass lesion, fluid collection or nodal enlargement in the thorax. Electronically Signed   By: Karen Kays M.D.   On: 03/23/2023 17:40    Labs:  CBC: Recent Labs    04/10/23 0423 04/14/23 0812 04/15/23 0306 04/16/23 0321  WBC 5.0 5.6 5.8 5.1  HGB 9.7* 10.4* 8.0* 7.9*  HCT 31.6* 34.7* 27.1* 27.0*  PLT 161 153 120* 130*    COAGS: No results for input(s): "INR", "APTT" in the last 8760 hours.  BMP: Recent Labs    04/10/23 0423 04/14/23 0812 04/15/23 0306 04/16/23 0321  NA 137 136 138 139  K 3.6 3.1* 3.7 3.8  CL 102 100 105 107  CO2 27 25 26 26   GLUCOSE 111* 105* 123* 97  BUN 11 11 11 9   CALCIUM 9.0 9.1 8.0* 8.3*  CREATININE 1.06* 1.10* 0.91 1.00  GFRNONAA >60 59* >60 >60    LIVER FUNCTION TESTS: Recent Labs    04/10/23 0423 04/14/23 0812 04/15/23 0306 04/16/23 0321  BILITOT 0.8 0.7 0.5 0.7  AST 18 18 14* 13*  ALT 9 9 8 8   ALKPHOS 87 92 64 62  PROT 7.3 8.1 6.6 6.3*  ALBUMIN 3.5 4.0 3.0* 2.9*    TUMOR MARKERS: No results for input(s): "AFPTM", "CEA", "CA199", "CHROMGRNA" in the last 8760 hours.  Assessment and Plan: 58 y.o. female with metastatic pancreatic CA and bilateral hydronephrosis, IR was consulted for celiac plexus block and bilateral PCN placement.   VSS CBC with hgb 7.9, plt 130  RF normal  Allergies reviewed  On Flagyl 500 mg q 12 hrs and Rocephin 2 g q 24 hrs - additional abx for PCN placement per performing IR  radiologist.   Risks and benefits of celiac plexus block were discussed with the patient including allergic reaction, decreased blood flow to the spinal cord/paralysis, gastroparesis, nerve damage, bleeding, infection, and damage to adjacent structures.  Risks and benefits of bilateral PCN placement was discussed with the patient including, but not limited to, infection, bleeding, significant bleeding causing loss or decrease in renal function or damage to adjacent structures.   All of the patient's questions were answered, patient is agreeable to proceed.  Consent signed and in chart.  The procedures are tentatively scheduled for Monday pending IR schedule.  Patient was notified that the procedures may not be performed on the same day, and they may be delayed until mid-late this week.   PLAN - NPO except meds at Monday [place] - Labs in AM - Will discuss about the abx for PCN placement with performing radiologist as patient currently on Flagyl and Rocephin    Thank you for this interesting consult.  I greatly enjoyed meeting [facility] and look forward to participating in their care.  A copy of this report was sent to the requesting provider on this date.  Electronically Signed: Willette Brace, PA-C 04/16/2023, 12:20 PM   I spent a total of 55 Miinutes    in face to face in clinical consultation, greater than 50% of which was counseling/coordinating care for celiac plexus block and bilateral PCN placement.   This chart was dictated using voice recognition software.  Despite best efforts to proofread,  errors can occur which can change the documentation meaning.

## 2023-04-16 NOTE — Assessment & Plan Note (Signed)
 Patient experiencing severe bilateral flank and lower abdominal pain that has been progressively worsening over the past week Progressively worsening pain is felt to be secondary to progressive tumor burden in the pelvis with possible contribution of progressively worsening bilateral hydronephrosis High-dose opiate therapy has recently been titrated and patient is currently on MS Contin 60 mg every 8 hours Patient is additionally being provided with as needed intravenous Dilaudid for severe pain Palliative care has been consulted to assist with pain management, their input is appreciated After lengthy discussion with palliative care and the patient, we feel that the patient may benefit from celiac plexus block as a means to reduce her reliance on opiate therapy.  I have discussed the case with interventional radiology and placed an order and this is likely to be done on Monday

## 2023-04-16 NOTE — Plan of Care (Signed)
   Problem: Education: Goal: Knowledge of General Education information will improve Description: Including pain rating scale, medication(s)/side effects and non-pharmacologic comfort measures Outcome: Progressing   Problem: Clinical Measurements: Goal: Ability to maintain clinical measurements within normal limits will improve Outcome: Progressing Goal: Cardiovascular complication will be avoided Outcome: Progressing    Problem: Activity: Goal: Risk for activity intolerance will decrease Outcome: Progressing   Problem: Nutrition: Goal: Adequate nutrition will be maintained Outcome: Progressing   Problem: Coping: Goal: Level of anxiety will decrease Outcome: Progressing

## 2023-04-16 NOTE — Progress Notes (Signed)
 Daily Progress Note   Patient Name: Kaitlin Ferrell       Date: 04/16/2023 DOB: [birth year]  Age: 58 y.o. MRN#: [id number] Attending Physician: Marinda Elk, MD Primary Care Physician: Bernadette Hoit, MD Admit Date: 04/14/2023  Reason for Consultation/Follow-up: Non pain symptom management and Pain control  Subjective: Awake alert resting in bed, had a bowel movement on 3 - 8 - 25.  Episodic uncontrolled abdominal pain continues.  Length of Stay: 2  Current Medications: Scheduled Meds:   Chlorhexidine Gluconate Cloth  6 each Topical Daily   lactulose  20 g Oral TID   morphine  60 mg Oral Q8H   pantoprazole  40 mg Oral Daily   senna-docusate  2 tablet Oral TID   sodium chloride flush  10-40 mL Intracatheter Q12H    Continuous Infusions:  cefTRIAXone (ROCEPHIN)  IV Stopped (04/15/23 1548)   metronidazole 500 mg (04/16/23 0909)    PRN Meds: acetaminophen **OR** acetaminophen, HYDROmorphone (DILAUDID) injection, HYDROmorphone, lip balm, ondansetron **OR** ondansetron (ZOFRAN) IV, sodium chloride flush  Physical Exam         Awake alert resting in bed Mild abdominal distention Mild to moderate generalized abdominal discomfort Trace edema No focal deficits  Vital Signs: BP 135/82 (BP Location: Right Arm)   Pulse 77   Temp 98.2 F (36.8 C) (Oral)   Resp 16   Ht 5\' 2"  (1.575 m)   Wt 52.6 kg   LMP 06/26/2013 Comment: low dose BCP  SpO2 97%   BMI 21.22 kg/m  SpO2: SpO2: 97 % O2 Device: O2 Device: Room Air O2 Flow Rate:    Intake/output summary:  Intake/Output Summary (Last 24 hours) at 04/16/2023 1043 Last data filed at 04/16/2023 1028 Gross per 24 hour  Intake 1855.69 ml  Output 0 ml  Net 1855.69 ml   LBM: Last BM Date : 04/15/23 Baseline Weight: Weight:  52.6 kg Most recent weight: Weight: 52.6 kg       Palliative Assessment/Data:      Patient Active Problem List   Diagnosis Date Noted   Normocytic anemia 04/15/2023   Thrombocytopenia (HCC) 04/15/2023   Intractable abdominal pain 04/14/2023   Bilateral hydronephrosis 04/14/2023   Proctitis 04/14/2023   Abdominal pain 04/11/2023   Adjustment disorder with anxiety 04/11/2023   Constipation 04/11/2023   Intractable vomiting  with nausea 03/25/2023   DVT of upper extremity (deep vein thrombosis) (HCC) 05/26/2022   Chemotherapy-induced nausea and vomiting 05/09/2022   Hypokalemia 05/09/2022   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) 05/09/2022   Port-A-Cath in place 04/01/2022   Genetic testing 04/01/2022   Family history of breast cancer 03/29/2022   Pancreatic adenocarcinoma (HCC) 03/24/2022   Gastroesophageal reflux disease without esophagitis 10/17/2018   History of bladder surgery 08/07/2018   Hx of hysterectomy 08/07/2018   Midline cystocele 06/14/2018   Rectocele 06/14/2018   Uterovaginal prolapse, incomplete 06/14/2018   Hx of colonic polyp 10/13/2017   Hot flashes 04/22/2016   Insomnia 04/22/2016   Panic attack as reaction to stress 04/22/2016   Ventral hernia s/p lap repair w mesh July 2015 08/27/2012   Diastasis recti 08/27/2012   Migraine headache 12/14/2011   Gestational trophoblastic disease s/p chemotherapy 2013 11/23/2011   H/O lumpectomy 11/11/1999    Palliative Care Assessment & Plan   Patient Profile:    Assessment: 58 year old lady with metastatic pancreatic cancer to liver lymph nodes and peritoneal carcinomatosis, cancer associated pain, severe constipation, new onset bilateral hydronephrosis. Palliative consult for pain management.  Recommendations/Plan: With the patient's husband present on the phone, we discussed about patient's current pain regimen.  Discussed about long-acting and short acting opioids.  Gave them some information  about celiac plexus nerve block.  Patient wishes to discuss further with interventional radiology with regards to the celiac plexus nerve block.  At present, does not want PCA.  Palliative services will continue to follow for pain and non-- pain symptom management.  Medication history reviewed.  Continue current opioid regimen and monitor.  Goals of Care and Additional Recommendations: Limitations on Scope of Treatment: Full Scope Treatment  Code Status:    Code Status Orders  (From admission, onward)           Start     Ordered   04/14/23 1404  Full code  Continuous       Question:  By:  Answer:  Consent: discussion documented in EHR   04/14/23 1405           Code Status History     Date Active Date Inactive Code Status Order ID Comments User Context   03/25/2023 1418 03/26/2023 1804 Full Code [id number]  Maryln Gottron, MD ED   05/09/2022 1041 05/10/2022 1838 Full Code [id number]  Maryln Gottron, MD ED   05/02/2022 1540 05/03/2022 0505 Full Code [id number]  Simonne Come, MD HOV   04/19/2022 1248 04/20/2022 0505 Full Code [id number]  Gilmer Mor, DO HOV   03/22/2022 1440 03/23/2022 0509 Full Code [id number]  Gilmer Mor, DO HOV   03/22/2022 1440 03/22/2022 1440 Full Code [id number]  Gilmer Mor, DO HOV       Prognosis:  Unable to determine  Discharge Planning: To Be Determined  Care plan was discussed with her, her husband was on the phone.  Thank you for allowing the Palliative Medicine Team to assist in the care of this patient. Mod MDM.      Greater than 50%  of this time was spent counseling and coordinating care related to the above assessment and plan.  Rosalin Hawking, MD  Please contact Palliative Medicine Team phone at [phone] for questions and concerns.

## 2023-04-16 NOTE — Assessment & Plan Note (Signed)
 Large amount of stool seen throughout the colon, likely secondary to progressive tumor burden and ongoing opiate therapy Oncology has initiated patient on regimen of both senna and lactulose 3 times daily, will monitor for response

## 2023-04-16 NOTE — Plan of Care (Signed)

## 2023-04-16 NOTE — Progress Notes (Signed)
 PROGRESS NOTE   Kaitlin Ferrell  LOV:[id number] DOB: [birth year] DOA: 04/14/2023 PCP: Bernadette Hoit, MD   Date of Service: the patient was seen and examined on 04/16/2023  Brief Narrative:  58 y.o. female with medical history significant of metastatic adenocardinoma of the pancreas (Dx 03/22/2022, follows with Dr. Leonides Schanz,  on FOLFIRINOX q2weeks), hypertension, GERD, portal vein occlusion and right upper extremity DVT (05/2022 on Eliquis)  who presented to the emergency department complaints of progressively worsening abdominal pain associated with constipation for the past week that has not responded to mag citrate, Fleet enema and laxatives.   Of note, the patient has been receiving increasing doses of Dilaudid 2-4mg  Q4hrs as needed as well as MS Contin 60 mg every 8 hours for worsening pain.  Workup for UTI in the past week has been negative.   Upon evaluation in the emergency department, CT abdomen/pelvis with contrast showing interval development of moderate bilateral hydroureteronephrosis, hepatic metastatic lesions that appear to be increasing in size, severe wall thickening of the likely distal sigmoid thought to be due to either inflammation or metastatic disease and redemonstration of the 3.5 x 2.3 cm pancreatic head mass that appears to be causing occlusion of the superior mesenteric vein with collateral flow to the splenic vein.  Dr. Cardell Peach with urology was consulted due to the bilateral hydronephrosis.  The hospitalist group was then called to assess the patient for admission to the hospital.   Assessment & Plan Intractable abdominal pain Patient experiencing severe bilateral flank and lower abdominal pain that has been progressively worsening over the past week Progressively worsening pain is felt to be secondary to progressive tumor burden in the pelvis with possible contribution of progressively worsening bilateral hydronephrosis High-dose opiate therapy has recently been titrated  and patient is currently on MS Contin 60 mg every 8 hours Patient is additionally being provided with as needed intravenous Dilaudid for severe pain Palliative care has been consulted to assist with pain management, their input is appreciated After lengthy discussion with palliative care and the patient, we feel that the patient may benefit from celiac plexus block as a means to reduce her reliance on opiate therapy.  I have discussed the case with interventional radiology and placed an order and this is likely to be done on Monday Bilateral hydronephrosis Substantial bilateral hydronephrosis, felt to be secondary to increasing mass effect due to tumor burden in the pelvis Case discussed with Dr. Cardell Peach with urology, patient is felt to not be able to benefit from ureteral stents with bilateral nephrostomy tubes likely being the only means of diverting urine output and preserving renal function Patient was originally apprehensive about proceeding with nephrostomy tubes but after a follow-up discussion with both myself and Dr. Bertis Ruddy with oncology the patient is now agreeable Nephrostomy tubes discussed with interventional radiology and orders have been placed.  Patient is likely to proceed with bilateral nephrostomy tube placement on Monday. Pancreatic adenocarcinoma (HCC) Evidence of chemotherapy failure and substantial progression of metastatic pancreatic cancer on CT imaging with multiple developing complications including bilateral hydronephrosis as well as moderate to severe wall thickening of the rectum and distal sigmoid colon suggesting possibly malignancy. Patient typically follows with Dr. Leonides Schanz in the outpatient setting. I have discussed the case thoroughly with Dr. Bertis Ruddy who has evaluated the patient at the bedside today in consultation, her input is appreciated. Fortunately the patient is not a candidate for further chemotherapies.  Overall prognosis is quite poor  Palliative care has  additionally been  consulted to assist with goals of care discussions. Constipation Large amount of stool seen throughout the colon, likely secondary to progressive tumor burden and ongoing opiate therapy Oncology has initiated patient on regimen of both senna and lactulose 3 times daily, will monitor for response Gastroesophageal reflux disease without esophagitis Protonix daily Hypokalemia Replacing with potassium chloride Evaluating for concurrent hypomagnesemia  Monitoring potassium levels with serial chemistries.  Normocytic anemia Normocytic anemia and thrombocytopenia likely secondary to recent chemotherapy No clinical evidence of bleeding Iron panel, ferritin, folate, vitamin B12 Monitoring counts with serial CBCs Thrombocytopenia (HCC) See assessment and plan above Proctitis      Subjective:  Patient complaining of bilateral flank and lower abdominal pain.  Sharp in quality, moderate to severe in intensity, worse with movement and improved with rest.  This is associated with intense constipation.  Physical Exam:  Vitals:   04/15/23 0958 04/15/23 1326 04/15/23 2058 04/16/23 0531  BP: 123/78 137/82 131/79 135/82  Pulse: 87 97 84 77  Resp: 16 16 16 16   Temp: 99 F (37.2 C) 98.3 F (36.8 C) 98.1 F (36.7 C) 98.2 F (36.8 C)  TempSrc: Oral Oral Oral Oral  SpO2: 100% 98% 98% 97%  Weight:      Height:        Constitutional: Awake alert and oriented x3, patient is in distress due to pain. Skin: no rashes, no lesions, poor skin turgor noted. Eyes: Pupils are equally reactive to light.  No evidence of scleral icterus or conjunctival pallor.  ENMT: Dry mucous membranes noted.  Posterior pharynx clear of any exudate or lesions.   Respiratory: clear to auscultation bilaterally, no wheezing, no crackles. Normal respiratory effort. No accessory muscle use.  Cardiovascular: Regular rate and rhythm, no murmurs / rubs / gallops. No extremity edema. 2+ pedal pulses. No carotid  bruits.  Abdomen: Lower abdominal and bilateral flank tenderness.  Abdomen is soft.  Positive bowel sounds noted in all quadrants.   Musculoskeletal: No joint deformity upper and lower extremities. Good ROM, no contractures. Normal muscle tone.    Data Reviewed:  I have personally reviewed and interpreted labs, imaging.  Significant findings are   CBC: Recent Labs  Lab 04/10/23 0423 04/14/23 0812 04/15/23 0306 04/16/23 0321  WBC 5.0 5.6 5.8 5.1  NEUTROABS 3.4  --   --  3.1  HGB 9.7* 10.4* 8.0* 7.9*  HCT 31.6* 34.7* 27.1* 27.0*  MCV 92.7 92.3 93.4 94.4  PLT 161 153 120* 130*   Basic Metabolic Panel: Recent Labs  Lab 04/10/23 0423 04/14/23 0812 04/15/23 0306 04/16/23 0321  NA 137 136 138 139  K 3.6 3.1* 3.7 3.8  CL 102 100 105 107  CO2 27 25 26 26   GLUCOSE 111* 105* 123* 97  BUN 11 11 11 9   CREATININE 1.06* 1.10* 0.91 1.00  CALCIUM 9.0 9.1 8.0* 8.3*  MG  --  2.5*  --  2.4  PHOS  --  3.3  --   --    GFR: Estimated Creatinine Clearance: 49.7 mL/min (by C-G formula based on SCr of 1 mg/dL). Liver Function Tests: Recent Labs  Lab 04/10/23 0423 04/14/23 0812 04/15/23 0306 04/16/23 0321  AST 18 18 14* 13*  ALT 9 9 8 8   ALKPHOS 87 92 64 62  BILITOT 0.8 0.7 0.5 0.7  PROT 7.3 8.1 6.6 6.3*  ALBUMIN 3.5 4.0 3.0* 2.9*    Code Status:  Full code.  Code status decision has been confirmed with: patient Family Communication: Husband has  been updated on plan of care via phone conversation.   Severity of Illness:  The appropriate patient status for this patient is INPATIENT. Inpatient status is judged to be reasonable and necessary in order to provide the required intensity of service to ensure the patient's safety. The patient's presenting symptoms, physical exam findings, and initial radiographic and laboratory data in the context of their chronic comorbidities is felt to place them at high risk for further clinical deterioration. Furthermore, it is not anticipated  that the patient will be medically stable for discharge from the hospital within 2 midnights of admission.   * I certify that at the point of admission it is my clinical judgment that the patient will require inpatient hospital care spanning beyond 2 midnights from the point of admission due to high intensity of service, high risk for further deterioration and high frequency of surveillance required.*  Time spent:  59 minutes  Author:  Marinda Elk MD  04/16/2023 8:35 AM

## 2023-04-16 NOTE — Assessment & Plan Note (Signed)
 Protonix daily

## 2023-04-16 NOTE — Progress Notes (Signed)
 Kaitlin Ferrell   DOB:[birth year]   ZO#:[id number]    ASSESSMENT & PLAN:   Metastatic pancreatic cancer to the liver, lymph nodes and peritoneal carcinomatosis The patient is refractory to FOLFIRINOX and gem/Abraxane chemotherapy Review of recent oncology outpatient follow-up notes indicated that the patient is currently on palliative measures only She has not received any chemotherapy since a month ago CT imaging from 04/14/2023 was reviewed which show slight disease progression but most importantly, signs of new bilateral hydronephrosis Continue supportive care Appreciate palliative care consult    Cancer associated pain We discussed the use of PCA but after much discussion with palliative care team, she decided to hold off  Plan for celiac axis block next week, currently being evaluated by interventional radiologist I will defer pain management to palliative care service recommendation  severe constipation Significant stool burden noted on CT imaging We discussed aggressive laxative therapy and she agreed She had multiple bowel movement with current laxative therapy   New onset bilateral hydronephrosis This is new in comparison from CT imaging dated March 20, 2023 In a short span of less than a month, her disease progression has caused bilateral hydronephrosis I recommend intervention now rather than later as the patient would not be a candidate for hemodialysis if and when she progress into renal failure We discussed risk and benefits of internal stent placement versus percutaneous bilateral nephrostomy tube placement I have reviewed urology consult We discussed the timing of her procedure and the patient is willing to undergo the procedure while she is hospitalized in order to save her kidneys from going to failure She is currently being evaluated by interventional radiologist for possible bilateral percutaneous nephrostomy tube placement  Acquired  pancytopenia Multifactorial, could be related to recent chemotherapy as well as poor nutritional status Observe closely for now   Discharge planning and goals of care With address the goals of this admission The patient would like her pain reasonably controlled, improvement of constipation as well as intervention for new onset bilateral hydronephrosis In terms of her prognosis, she appears relatively healthy and I think her survival is still measuring months and she could certainly benefit from these interventions Dr. Leonides Schanz will resume care next week  [name], MD 04/16/2023 12:12 PM  Subjective:  She is still having a lot of pain.  She is currently not on PCA.  I reviewed recommendations from palliative care service.  She had multiple bowel movement.  She is in the process of being evaluated by interventional radiologist for procedures above  Objective:  Vitals:   04/15/23 2058 04/16/23 0531  BP: 131/79 135/82  Pulse: 84 77  Resp: 16 16  Temp: 98.1 F (36.7 C) 98.2 F (36.8 C)  SpO2: 98% 97%     Intake/Output Summary (Last 24 hours) at 04/16/2023 1212 Last data filed at 04/16/2023 1028 Gross per 24 hour  Intake 1855.69 ml  Output 0 ml  Net 1855.69 ml

## 2023-04-16 NOTE — Assessment & Plan Note (Signed)
·   Replacing with potassium chloride °· Evaluating for concurrent hypomagnesemia  °· Monitoring potassium levels with serial chemistries. ° °

## 2023-04-16 NOTE — Assessment & Plan Note (Signed)
 Normocytic anemia and thrombocytopenia likely secondary to recent chemotherapy No clinical evidence of bleeding Iron panel, ferritin, folate, vitamin B12 Monitoring counts with serial CBCs

## 2023-04-16 NOTE — Assessment & Plan Note (Signed)
 Evidence of chemotherapy failure and substantial progression of metastatic pancreatic cancer on CT imaging with multiple developing complications including bilateral hydronephrosis as well as moderate to severe wall thickening of the rectum and distal sigmoid colon suggesting possibly malignancy. Patient typically follows with Dr. Leonides Schanz in the outpatient setting. I have discussed the case thoroughly with Dr. Bertis Ruddy who has evaluated the patient at the bedside today in consultation, her input is appreciated. Fortunately the patient is not a candidate for further chemotherapies.  Overall prognosis is quite poor  Palliative care has additionally been consulted to assist with goals of care discussions.

## 2023-04-17 ENCOUNTER — Inpatient Hospital Stay (HOSPITAL_COMMUNITY)

## 2023-04-17 DIAGNOSIS — R109 Unspecified abdominal pain: Secondary | ICD-10-CM | POA: Diagnosis not present

## 2023-04-17 HISTORY — PX: IR NEPHROSTOMY PLACEMENT RIGHT: IMG6064

## 2023-04-17 LAB — COMPREHENSIVE METABOLIC PANEL
ALT: 6 U/L (ref 0–44)
AST: 14 U/L — ABNORMAL LOW (ref 15–41)
Albumin: 2.8 g/dL — ABNORMAL LOW (ref 3.5–5.0)
Alkaline Phosphatase: 70 U/L (ref 38–126)
Anion gap: 8 (ref 5–15)
BUN: 8 mg/dL (ref 6–20)
CO2: 25 mmol/L (ref 22–32)
Calcium: 8.3 mg/dL — ABNORMAL LOW (ref 8.9–10.3)
Chloride: 105 mmol/L (ref 98–111)
Creatinine, Ser: 1.05 mg/dL — ABNORMAL HIGH (ref 0.44–1.00)
GFR, Estimated: >60 mL/min (ref >60)
Glucose, Bld: 106 mg/dL — ABNORMAL HIGH (ref 70–99)
Potassium: 3.6 mmol/L (ref 3.5–5.1)
Sodium: 138 mmol/L (ref 135–145)
Total Bilirubin: 0.7 mg/dL (ref 0.0–1.2)
Total Protein: 6.1 g/dL — ABNORMAL LOW (ref 6.5–8.1)

## 2023-04-17 LAB — CBC WITH DIFFERENTIAL/PLATELET
Abs Immature Granulocytes: 0.03 10*3/uL (ref 0.00–0.07)
Basophils Absolute: 0 10*3/uL (ref 0.0–0.1)
Basophils Relative: 0 %
Eosinophils Absolute: 0.3 10*3/uL (ref 0.0–0.5)
Eosinophils Relative: 4 %
HCT: 27.1 % — ABNORMAL LOW (ref 36.0–46.0)
Hemoglobin: 7.9 g/dL — ABNORMAL LOW (ref 12.0–15.0)
Immature Granulocytes: 1 %
Lymphocytes Relative: 15 %
Lymphs Abs: 0.9 10*3/uL (ref 0.7–4.0)
MCH: 27.5 pg (ref 26.0–34.0)
MCHC: 29.2 g/dL — ABNORMAL LOW (ref 30.0–36.0)
MCV: 94.4 fL (ref 80.0–100.0)
Monocytes Absolute: 1 10*3/uL (ref 0.1–1.0)
Monocytes Relative: 17 %
Neutro Abs: 3.7 10*3/uL (ref 1.7–7.7)
Neutrophils Relative %: 63 %
Platelets: 104 10*3/uL — ABNORMAL LOW (ref 150–400)
RBC: 2.87 MIL/uL — ABNORMAL LOW (ref 3.87–5.11)
RDW: 17 % — ABNORMAL HIGH (ref 11.5–15.5)
WBC: 5.9 10*3/uL (ref 4.0–10.5)
nRBC: 0 % (ref 0.0–0.2)

## 2023-04-17 LAB — PROTIME-INR
INR: 1.3 — ABNORMAL HIGH (ref 0.8–1.2)
Prothrombin Time: 16.2 s — ABNORMAL HIGH (ref 11.4–15.2)

## 2023-04-17 MED ORDER — LIDOCAINE-EPINEPHRINE 1 %-1:100000 IJ SOLN
INTRAMUSCULAR | Status: AC
  Filled 2023-04-17: qty 1

## 2023-04-17 MED ORDER — FENTANYL CITRATE (PF) 100 MCG/2ML IJ SOLN
INTRAMUSCULAR | Status: AC | PRN
  Administered 2023-04-17 (×2): 50 ug via INTRAVENOUS

## 2023-04-17 MED ORDER — SODIUM CHLORIDE 0.9% FLUSH
5.0000 mL | Freq: Three times a day (TID) | INTRAVENOUS | Status: DC
  Administered 2023-04-17 – 2023-04-19 (×6): 5 mL

## 2023-04-17 MED ORDER — MIDAZOLAM HCL 2 MG/2ML IJ SOLN
INTRAMUSCULAR | Status: AC | PRN
Start: 2023-04-17 — End: 2023-04-17
  Administered 2023-04-17 (×2): 1 mg via INTRAVENOUS

## 2023-04-17 MED ORDER — FENTANYL CITRATE (PF) 100 MCG/2ML IJ SOLN
INTRAMUSCULAR | Status: AC
  Filled 2023-04-17: qty 2

## 2023-04-17 MED ORDER — SODIUM CHLORIDE 0.9 % IV SOLN
INTRAVENOUS | Status: AC
  Filled 2023-04-17: qty 20

## 2023-04-17 MED ORDER — LIDOCAINE-EPINEPHRINE 1 %-1:100000 IJ SOLN
20.0000 mL | Freq: Once | INTRAMUSCULAR | Status: AC
  Administered 2023-04-17: 20 mL via INTRADERMAL

## 2023-04-17 MED ORDER — IOHEXOL 300 MG/ML  SOLN
50.0000 mL | Freq: Once | INTRAMUSCULAR | Status: AC | PRN
  Administered 2023-04-17: 1 mL

## 2023-04-17 MED ORDER — MIDAZOLAM HCL 2 MG/2ML IJ SOLN
INTRAMUSCULAR | Status: AC
  Filled 2023-04-17: qty 4

## 2023-04-17 NOTE — Procedures (Signed)
  Procedure:  Bilat perc nephrostomy catheter placement 43f x2 Preprocedure diagnosis: The primary encounter diagnosis was Metastatic malignant neoplasm, unspecified site (HCC). Diagnoses of Hydronephrosis, unspecified hydronephrosis type and Constipation, unspecified constipation type were also pertinent to this visit. Postprocedure diagnosis: same EBL:    minimal Complications:   none immediate  See full dictation in [employer].  Thora Lance MD Main # [phone] Pager  [phone] Mobile [phone]

## 2023-04-17 NOTE — Progress Notes (Signed)
 PROGRESS NOTE    Kaitlin Ferrell  FIE:[id number] DOB: 10-17-1965 DOA: 04/14/2023 PCP: Bernadette Hoit, MD    Brief Narrative:   Kaitlin Ferrell is a 58 y.o. female with past medical history significant for metastatic adenocarcinoma of the pancreas (Dx 03/22/2022, follows with Dr. Leonides Schanz, on FOLFIRINOX q2weeks), hypertension, GERD, portal vein occlusion and right upper extremity DVT (05/2022 on Eliquis) who presented to the emergency department complaints of progressively worsening abdominal pain over the last week despite utilizing laxative and enemas outpatient.  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 Dr. Cardell Peach with urology was consulted due to the bilateral hydronephrosis. The hospitalist group was then called to assess the patient for admission to the hospital.   Assessment & Plan:   Intractable abdominal pain Patient presenting to ED with progressive severe bilateral flank, lower abdominal pain.  Imaging notable for progressive tumor burden in the pelvis with worsening bilateral hydronephrosis. -- Palliative care, medical oncology following; appreciate assistance -- IR consulted for celiac plexus block -- MS Contin 60 mg PO q8h -- Dilaudid 2-4 mg PO q4h PRN moderate/severe pain -- Dilaudid 1 - 2 mg IV q3h PRN moderate/severe breakthrough pain  Bilateral hydronephrosis CT abdomen/pelvis with interval development of moderate bilateral hydroureteronephrosis without evidence of obstructing calculus, likely due to external compression.  Seen by urology, Dr.  Cardell Peach with recommendation of bilateral nephrostomy tube placement.  IR was consulted and patient went percutaneous bilateral nephrostomy tube placement on 04/17/2023. -- Continue to monitor urine output  Metastatic pancreatic adenocarcinoma Patient follows with medical oncology outpatient, Dr. Leonides Schanz.  Imaging on admission shows substantial progression of metastatic pancreatic cancer with multiple developing complications including bilateral hydronephrosis, moderate/severe wall thickening of the rectum and distal sigmoid colon.  Overall prognosis is poor and not a candidate for future chemotherapy. -- Medical oncology, palliative care following, appreciate assistance  Constipation Large amount of stool throughout colon on imaging likely secondary to progressive tumor burden and ongoing opiates needs. -- Lactulose 20 g PO TID  Hypokalemia Repleted, potassium 3.6 today.  Normocytic anemia Anemia panel with iron 29, TIBC low at 158, ferritin 70, B12 2000 484, folate 8.9. -- Hemoglobin 7.9, stable. -- Transfuse for hemoglobin less than 7.0 -- CBC in am  Thrombocytopenia -- Plt 153>120>130>104 -- CBC daily  GERD -- Protonix 40 mg p.o. daily   DVT prophylaxis: SCDs Start: 04/14/23 1404    Code Status: Full Code Family Communication: No family present at bedside this morning  Disposition Plan:  Level of care: Med-Surg Status is: Inpatient Remains inpatient appropriate because: IR for bilateral percutaneous nephrostomy tubes and celiac plexus block today, anticipate discharge home in 1-2 days of pain controlled    Consultants:  Medical oncology Palliative care Urology, Dr. Cardell Peach Interventional radiology  Procedures:  Bilateral percutaneous nephrostomy tube, IR 3/10 For celiac plexus block: Pending  Antimicrobials:  Ceftriaxone 3/7>> Metronidazole 3/7>>  Subjective: Patient seen examined bedside, lying in bed.  Difficulty getting "comfortable" due to pain.  Awaiting  percutaneous nephrostomy and celiac plexus block by IR today.  Patient reports bowel movements on current bowel regimen.  No other specific questions, concerns  or complaints at this time.  Denies headache, no fever/chills/night events, no nausea/vomiting/diarrhea, no chest pain, no palpitations, no shortness of breath, no focal weakness, no fatigue, no paresthesias.  No acute events overnight per nursing.  Objective: Vitals:   04/17/23 1300 04/17/23 1305 04/17/23 1310 04/17/23 1315  BP: 132/82 (!) 144/82 (!) 146/82 (!) 141/87  Pulse: 89 97 (!) 104 (!) 107  Resp: 15 16 15 17   Temp:      TempSrc:      SpO2: 100% 100% 100% 100%  Weight:      Height:        Intake/Output Summary (Last 24 hours) at 04/17/2023 1346 Last data filed at 04/17/2023 1000 Gross per 24 hour  Intake 880 ml  Output 0 ml  Net 880 ml   Filed Weights   04/14/23 0630  Weight: 52.6 kg    Examination:  Physical Exam: GEN: NAD, alert and oriented x 3, wd/wn HEENT: NCAT, PERRL, EOMI, sclera clear, MMM PULM: CTAB w/o wheezes/crackles, normal respiratory effort, on room air CV: RRR w/o M/G/R GI: abd soft, slight TTP bilateral flank, nondistended, +BS MSK: no peripheral edema, muscle strength globally intact 5/5 bilateral upper/lower extremities NEURO: No focal neurological deficits PSYCH: normal mood/affect Integumentary: d no concerning rashes/lesions/wounds noted on exposed skin surfaces    Data Reviewed: I have personally reviewed following labs and imaging studies  CBC: Recent Labs  Lab 04/14/23 0812 04/15/23 0306 04/16/23 0321 04/17/23 0323  WBC 5.6 5.8 5.1 5.9  NEUTROABS  --   --  3.1 3.7  HGB 10.4* 8.0* 7.9* 7.9*  HCT 34.7* 27.1* 27.0* 27.1*  MCV 92.3 93.4 94.4 94.4  PLT 153 120* 130* 104*   Basic Metabolic Panel: Recent Labs  Lab 04/14/23 0812 04/15/23 0306 04/16/23 0321 04/17/23 0323  NA 136 138 139 138  K 3.1* 3.7 3.8 3.6  CL 100 105 107 105  CO2 25 26 26 25   GLUCOSE 105* 123* 97  106*  BUN 11 11 9 8   CREATININE 1.10* 0.91 1.00 1.05*  CALCIUM 9.1 8.0* 8.3* 8.3*  MG 2.5*  --  2.4  --   PHOS 3.3  --   --   --    GFR: Estimated Creatinine Clearance: 47.3 mL/min (A) (by C-G formula based on SCr of 1.05 mg/dL (H)). Liver Function Tests: Recent Labs  Lab 04/14/23 0812 04/15/23 0306 04/16/23 0321 04/17/23 0323  AST 18 14* 13* 14*  ALT 9 8 8 6   ALKPHOS 92 64 62 70  BILITOT 0.7 0.5 0.7 0.7  PROT 8.1 6.6 6.3* 6.1*  ALBUMIN 4.0 3.0* 2.9* 2.8*   Recent Labs  Lab 04/14/23 0812  LIPASE 21   No results for input(s): "AMMONIA" in the last 168 hours. Coagulation Profile: Recent Labs  Lab 04/17/23 0323  INR 1.3*   Cardiac Enzymes: No results for input(s): "CKTOTAL", "CKMB", "CKMBINDEX", "TROPONINI" in the last 168 hours. BNP (last 3 results) No results for input(s): "PROBNP" in the last 8760 hours. HbA1C: No results for input(s): "HGBA1C" in the last 72 hours. CBG: No results for input(s): "GLUCAP" in the last 168 hours. Lipid Profile: No results for input(s): "CHOL", "HDL", "LDLCALC", "TRIG", "CHOLHDL", "LDLDIRECT" in the last 72 hours. Thyroid Function Tests: No results for input(s): "TSH", "T4TOTAL", "FREET4", "T3FREE", "THYROIDAB" in the last 72 hours. Anemia Panel: Recent Labs    04/16/23 0321  VITAMINB12 2,484*  FOLATE 8.9  FERRITIN 70  TIBC 158*  IRON 29   Sepsis Labs: No results for  input(s): "PROCALCITON", "LATICACIDVEN" in the last 168 hours.  No results found for this or any previous visit (from the past 240 hours).       Radiology Studies: No results found.      Scheduled Meds:  Chlorhexidine Gluconate Cloth  6 each Topical Daily   lactulose  20 g Oral TID   morphine  60 mg Oral Q8H   pantoprazole  40 mg Oral Daily   senna-docusate  2 tablet Oral TID   sodium chloride flush  10-40 mL Intracatheter Q12H   Continuous Infusions:  cefTRIAXone (ROCEPHIN)  IV 2 g (04/17/23 1300)   metronidazole 500 mg (04/17/23 0944)      LOS: 3 days    Time spent: 49 minutes spent on 04/17/2023 caring for this patient face-to-face including chart review, ordering labs/tests, documenting, discussion with nursing staff, consultants, updating family and interview/physical exam    Alvira Philips [place], DO [facility] Available via Epic secure chat 7am-7pm After these hours, please refer to coverage provider listed on [email or web address] 04/17/2023, 1:46 PM

## 2023-04-17 NOTE — Progress Notes (Signed)
 Daily Progress Note   Patient Name: Kaitlin Ferrell       Date: 04/17/2023 DOB: [birth year]  Age: 58 y.o. MRN#: [id number] Attending Physician: [place], Eric J, DO Primary Care Physician: Bernadette Hoit, MD Admit Date: 04/14/2023  Reason for Consultation/Follow-up: Non pain symptom management and Pain control  Subjective: Awake alert resting in bed, IR note reviewed, awaiting procedure. Abdominal pain somewhat better.  Length of Stay: 3  Current Medications: Scheduled Meds:   Chlorhexidine Gluconate Cloth  6 each Topical Daily   lactulose  20 g Oral TID   morphine  60 mg Oral Q8H   pantoprazole  40 mg Oral Daily   senna-docusate  2 tablet Oral TID   sodium chloride flush  10-40 mL Intracatheter Q12H    Continuous Infusions:  cefTRIAXone (ROCEPHIN)  IV 2 g (04/16/23 1413)   metronidazole 500 mg (04/17/23 0944)    PRN Meds: acetaminophen **OR** acetaminophen, HYDROmorphone (DILAUDID) injection **OR** HYDROmorphone (DILAUDID) injection, HYDROmorphone, lip balm, ondansetron **OR** ondansetron (ZOFRAN) IV, sodium chloride flush  Physical Exam         Awake alert resting in bed Mild abdominal distention Mild  generalized abdominal discomfort Trace edema No focal deficits  Vital Signs: BP 136/81 (BP Location: Right Arm)   Pulse 85   Temp 98.1 F (36.7 C) (Oral)   Resp 15   Ht 5\' 2"  (1.575 m)   Wt 52.6 kg   LMP 06/26/2013 Comment: low dose BCP  SpO2 98%   BMI 21.22 kg/m  SpO2: SpO2: 98 % O2 Device: O2 Device: Room Air O2 Flow Rate:    Intake/output summary:  Intake/Output Summary (Last 24 hours) at 04/17/2023 1142 Last data filed at 04/17/2023 1000 Gross per 24 hour  Intake 880 ml  Output 0 ml  Net 880 ml   LBM: Last BM Date : 04/16/23 Baseline Weight:  Weight: 52.6 kg Most recent weight: Weight: 52.6 kg       Palliative Assessment/Data:      Patient Active Problem List   Diagnosis Date Noted   Normocytic anemia 04/15/2023   Thrombocytopenia (HCC) 04/15/2023   Intractable abdominal pain 04/14/2023   Bilateral hydronephrosis 04/14/2023   Proctitis 04/14/2023   Abdominal pain 04/11/2023   Adjustment disorder with anxiety 04/11/2023   Constipation 04/11/2023   Intractable vomiting with nausea 03/25/2023  DVT of upper extremity (deep vein thrombosis) (HCC) 05/26/2022   Chemotherapy-induced nausea and vomiting 05/09/2022   Hypokalemia 05/09/2022   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) 05/09/2022   Port-A-Cath in place 04/01/2022   Genetic testing 04/01/2022   Family history of breast cancer 03/29/2022   Pancreatic adenocarcinoma (HCC) 03/24/2022   Gastroesophageal reflux disease without esophagitis 10/17/2018   History of bladder surgery 08/07/2018   Hx of hysterectomy 08/07/2018   Midline cystocele 06/14/2018   Rectocele 06/14/2018   Uterovaginal prolapse, incomplete 06/14/2018   Hx of colonic polyp 10/13/2017   Hot flashes 04/22/2016   Insomnia 04/22/2016   Panic attack as reaction to stress 04/22/2016   Ventral hernia s/p lap repair w mesh July 2015 08/27/2012   Diastasis recti 08/27/2012   Migraine headache 12/14/2011   Gestational trophoblastic disease s/p chemotherapy 2013 11/23/2011   H/O lumpectomy 11/11/1999    Palliative Care Assessment & Plan   Patient Profile:    Assessment: 59 year old lady with metastatic pancreatic cancer to liver lymph nodes and peritoneal carcinomatosis, cancer associated pain, severe constipation, new onset bilateral hydronephrosis. Palliative consult for pain management.  Recommendations/Plan: Awaiting celiac plexus nerve block and PCN IV Dilaudid PRN: 7 mg IV used in the past 24 hours.  Also on PO Dilaudid 4 mg : 2 doses in the past 24 hours.  MS Contin 60 mg PO Q 8  hours.  Palliative services will continue to follow for pain and non-- pain symptom management.  Medication history reviewed.  Continue current opioid regimen and monitor.  Goals of Care and Additional Recommendations: Limitations on Scope of Treatment: Full Scope Treatment  Code Status:    Code Status Orders  (From admission, onward)           Start     Ordered   04/14/23 1404  Full code  Continuous       Question:  By:  Answer:  Consent: discussion documented in EHR   04/14/23 1405           Code Status History     Date Active Date Inactive Code Status Order ID Comments User Context   03/25/2023 1418 03/26/2023 1804 Full Code [id number]  Maryln Gottron, MD ED   05/09/2022 1041 05/10/2022 1838 Full Code [id number]  Maryln Gottron, MD ED   05/02/2022 1540 05/03/2022 0505 Full Code [id number]  Simonne Come, MD HOV   04/19/2022 1248 04/20/2022 0505 Full Code [id number]  Gilmer Mor, DO HOV   03/22/2022 1440 03/23/2022 0509 Full Code [id number]  Gilmer Mor, DO HOV   03/22/2022 1440 03/22/2022 1440 Full Code [id number]  Gilmer Mor, DO HOV       Prognosis:  Unable to determine  Discharge Planning: To Be Determined  Care plan was discussed with patient.  Thank you for allowing the Palliative Medicine Team to assist in the care of this patient. Mod MDM.      Greater than 50%  of this time was spent counseling and coordinating care related to the above assessment and plan.  Rosalin Hawking, MD  Please contact Palliative Medicine Team phone at [phone] for questions and concerns.

## 2023-04-17 NOTE — Progress Notes (Signed)
 Kaitlin Ferrell   DOB:1965-04-16   RU#:[id number]      ASSESSMENT & PLAN:  Metastatic pancreatic cancer to liver, lymph nodes Peritoneal carcinomatosis Bilateral hydronephrosis, new onset -Patient refractory to FOLFIRINOX and gem/Abraxane chemotherapy - Recent outpatient oncology follow-up indicates patient currently on palliative measures only.  Has not received chemotherapy since a month ago - CT scan done 04/14/2023 shows slight disease progression however signs of new bilateral hydronephrosis - Consideration for internal stent placement versus percutaneous bilateral nephrostomy tubes, being evaluated by IR - Palliative following - Medical oncology/Dr. Leonides Schanz following  Pain, cancer associated - Patient complains of abdominal and back pain.  Sometimes back pain is worse than abdominal pain per patient. - Plan for celiac block today, patient is n.p.o. - Palliative following for pain management  Anemia Thrombocytopenia - Likely due to malignancy - Hemoglobin 8.8 today.  Transfuse PRBC for Hgb <7.0.  No transfusional intervention required at this time. - Platelets low 104K today.  Mild.  No transfusional intervention required at this time - Continue to monitor CBC with differential   Code Status Full  Subjective:  Patient seen awake and alert laying in bed.  Reports that she continues to have abdominal pain and back pain which she notes is sometimes worse than the abdominal pain.  She is thankful that she is going for nerve block today and is hoping that this will work for her pain.  No other acute distress is noted.  Objective:  Vitals:   04/16/23 2113 04/17/23 0555  BP: (!) 143/87 136/81  Pulse: 80 85  Resp: 16 15  Temp: 98.1 F (36.7 C) 98.1 F (36.7 C)  SpO2: 100% 98%     Intake/Output Summary (Last 24 hours) at 04/17/2023 1035 Last data filed at 04/17/2023 1000 Gross per 24 hour  Intake 880 ml  Output 0 ml  Net 880 ml     REVIEW OF SYSTEMS:    Constitutional: Denies fevers, chills or abnormal night sweats Eyes: Denies blurriness of vision, double vision or watery eyes Ears, nose, mouth, throat, and face: Denies mucositis or sore throat Respiratory: Denies cough, dyspnea or wheezes Cardiovascular: Denies palpitation, chest discomfort or lower extremity swelling Gastrointestinal: + Abdominal pain  skin: Denies abnormal skin rashes +back pain Lymphatics: Denies new lymphadenopathy or easy bruising Neurological: Denies numbness, tingling or new weaknesses Behavioral/Psych: Mood is stable, no new changes  All other systems were reviewed with the patient and are negative.  PHYSICAL EXAMINATION: ECOG PERFORMANCE STATUS: 2 - Symptomatic, <50% confined to bed  Vitals:   04/16/23 2113 04/17/23 0555  BP: (!) 143/87 136/81  Pulse: 80 85  Resp: 16 15  Temp: 98.1 F (36.7 C) 98.1 F (36.7 C)  SpO2: 100% 98%   Filed Weights   04/14/23 0630  Weight: 116 lb (52.6 kg)    GENERAL: alert, + mild distress and comfortable SKIN: + Pale skin color, texture, turgor are normal, no rashes or significant lesions EYES: normal, conjunctiva are pink and non-injected, sclera clear OROPHARYNX: no exudate, no erythema and lips, buccal mucosa, and tongue normal  NECK: supple, thyroid normal size, non-tender, without nodularity LYMPH: no palpable lymphadenopathy in the cervical, axillary or inguinal LUNGS: clear to auscultation and percussion with normal breathing effort HEART: regular rate & rhythm and no murmurs and no lower extremity edema ABDOMEN: abdomen soft, non-tender and normal bowel sounds MUSCULOSKELETAL: no cyanosis of digits and no clubbing  PSYCH: alert & oriented x 3 with fluent speech NEURO: no focal motor/sensory deficits  All questions were answered. The patient knows to call the clinic with any problems, questions or concerns.   The total time spent in the appointment was 40 minutes encounter with patient including review  of chart and various tests results, discussions about plan of care and coordination of care plan  Dawson Bills, NP 04/17/2023 10:35 AM    Labs Reviewed:  Lab Results  Component Value Date   WBC 5.9 04/17/2023   HGB 7.9 (L) 04/17/2023   HCT 27.1 (L) 04/17/2023   MCV 94.4 04/17/2023   PLT 104 (L) 04/17/2023   Recent Labs    04/15/23 0306 04/16/23 0321 04/17/23 0323  NA 138 139 138  K 3.7 3.8 3.6  CL 105 107 105  CO2 26 26 25   GLUCOSE 123* 97 106*  BUN 11 9 8   CREATININE 0.91 1.00 1.05*  CALCIUM 8.0* 8.3* 8.3*  GFRNONAA >60 >60 >60  PROT 6.6 6.3* 6.1*  ALBUMIN 3.0* 2.9* 2.8*  AST 14* 13* 14*  ALT 8 8 6   ALKPHOS 64 62 70  BILITOT 0.5 0.7 0.7    Studies Reviewed:  CT ABDOMEN PELVIS W CONTRAST Result Date: 04/14/2023 CLINICAL DATA:  Acute generalized abdominal pain. History of pancreatic cancer. EXAM: CT ABDOMEN AND PELVIS WITH CONTRAST TECHNIQUE: Multidetector CT imaging of the abdomen and pelvis was performed using the standard protocol following bolus administration of intravenous contrast. RADIATION DOSE REDUCTION: This exam was performed according to the departmental dose-optimization program which includes automated exposure control, adjustment of the mA and/or kV according to patient size and/or use of iterative reconstruction technique. CONTRAST:  OMNIPAQUE IOHEXOL 300 MG/ML  SOLN COMPARISON:  March 20, 2023. FINDINGS: Lower chest: No acute abnormality. Hepatobiliary: No cholelithiasis or biliary dilatation is noted. Multiple hepatic lesions are again noted consistent with metastatic disease. The largest measures 2.8 x 2.2 cm in posterior segment of right hepatic lobe which is enlarged compared to prior exam. 2.6 x 2.6 cm lesion is noted in superior portion of right hepatic lobe which is enlarged compared to prior exam as well. Pancreas: 3.5 x 2.3 cm pancreatic head mass is noted consistent with malignancy. Mild dilatation of common bile duct is noted. 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 It also seems to surround the proximal portion of the superior mesenteric artery, but it remains patent. Spleen: Normal in size without focal abnormality. Adrenals/Urinary Tract: Adrenal glands are unremarkable. Decreased enhancement of left kidney is noted. Moderate bilateral hydroureteronephrosis is noted without evidence of obstructing calculus, concerning for distal ureteral occlusion. Urinary bladder is only minimally distended. Stomach/Bowel: The stomach is unremarkable. There is no evidence of small bowel dilatation. Large amount of stool and fluid is seen throughout the colon. The appendix appears normal. There appears to be moderate to severe wall thickening of the rectum and possibly distal sigmoid colon suggesting inflammation or possibly malignancy. There are noted probable enhancing soft tissue lesions in this area suggesting peritoneal carcinomatosis. Vascular/Lymphatic: Abdominal aorta is unremarkable. 1.4 cm right external iliac lymph node is noted concerning for metastatic disease. 8 mm left periaortic lymph node is noted. 9 mm right periaortic lymph node is noted as well. Reproductive: Status post hysterectomy. No adnexal masses. Other: No ascites or hernia is noted. Musculoskeletal: No acute or significant osseous findings. IMPRESSION: There is interval development of moderate bilateral hydroureteronephrosis without evidence of obstructing calculus. This most likely is due to obstruction of distal ureters due  to external compression, most likely due to possible peritoneal implants or other metastatic disease in the pelvis. There is noted severe wall thickening of the rectum and probable distal sigmoid colon due to either inflammation or metastatic disease. Hepatic metastatic lesions are again noted which appear to be increased in size compared to prior exam. 3.5 x 2.3 cm pancreatic head mass is  noted consistent with malignancy. 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 This mass also appears to encase the superior mesenteric artery, but the artery remains patent. Retroperitoneal and right external iliac adenopathy is noted consistent with metastatic disease. Electronically Signed   By: Lupita Raider M.D.   On: 04/14/2023 12:41   DG Abdomen 1 View Result Date: 04/14/2023 CLINICAL DATA:  Abdominal pain and constipation EXAM: ABDOMEN - 1 VIEW COMPARISON:  CT abdomen and pelvis dated 03/20/2023 FINDINGS: Nonobstructive bowel gas pattern. No pneumatosis. Large volume stool throughout the colon. No abnormal radio-opaque calculi or mass effect. No acute or substantial osseous abnormality. The sacrum and coccyx are partially obscured by overlying bowel contents. IMPRESSION: Large volume stool throughout the colon. Electronically Signed   By: Agustin Cree M.D.   On: 04/14/2023 10:01   CT CHEST ABDOMEN PELVIS W CONTRAST Result Date: 03/23/2023 CLINICAL DATA:  Pancreatic adenocarcinoma restaging. * Tracking Code: BO * EXAM: CT CHEST, ABDOMEN, AND PELVIS WITH CONTRAST TECHNIQUE: Multidetector CT imaging of the chest, abdomen and pelvis was performed following the standard protocol during bolus administration of intravenous contrast. RADIATION DOSE REDUCTION: This exam was performed according to the departmental dose-optimization program which includes automated exposure control, adjustment of the mA and/or kV according to patient size and/or use of iterative reconstruction technique. CONTRAST:  OMNIPAQUE IOHEXOL 300 MG/ML  SOLN COMPARISON:  CT 01/20/2023 and older. FINDINGS: CT CHEST FINDINGS Cardiovascular: Left upper chest port in place. Tip extending into the right atrium. Heart is nonenlarged. No pericardial effusion. The thoracic aorta is normal course and caliber with slight atherosclerotic plaque. Calcified aorta. Bovine type aortic  arch. Mediastinum/Nodes: No specific abnormal lymph node enlargement present in the axillary regions, hilum or mediastinum. Normal caliber thoracic esophagus. Preserved thyroid gland. Lungs/Pleura: Bilateral apical pleural thickening identified. No consolidation, pneumothorax or effusion. Punctate calcifications with reticular changes along the lateral right lower lobe is unchanged from previous and possibly sequela of old infectious or inflammatory process. Musculoskeletal: Mild degenerative changes along the spine. A stable area of sclerosis along the T9 vertebral body. CT ABDOMEN PELVIS FINDINGS Hepatobiliary: Liver metastases are again seen. These are scattered throughout the liver. Specific lesions will be followed for continuity. Example peripheral right lower lobe measuring 15 x 15 mm previously, today on series 2, image 36 measures 2.1 x 2.0 cm. Segment 4A lesion which previously measured 10 x 7 mm, today is similar on series 7, image 80. However lobe lateral to this is a larger lesion which previously measured 15 x 11 mm and today on series 7, image 81 18 x 17 mm. Other lesions are also increased in size. Patent portal vein. Gallbladder is nondilated. Pancreas: Slight pancreatic duct ectasia. Relatively preserved pancreatic parenchyma. Along the inferior aspect however of the pancreatic head and uncinate process is a locally infiltrative mass. Previously this was measured at 4.1 x 3.0 cm. When remeasured today using the same technique this mass encasing the SMA on series 7, image 105 measures 4.0 x 3.3 cm. Similar. Measurements are difficult due to the nature  of the lesion and location. 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 Spleen: Normal in size without focal abnormality. Adrenals/Urinary Tract: Adrenal glands are unremarkable. Kidneys are normal, without renal calculi, focal lesion, or hydronephrosis. Bladder is  underdistended but has some wall thickening. Adjacent stranding. Stomach/Bowel: On this non oral contrast exam the stomach is nondilated. Small bowel is nondilated. There are loops of small bowel stool appearance in the pelvis, nonspecific. The cecum resides in the anterior low pelvis just above the bladder. Diffuse colonic stool identified. There is presence of wall thickening with some stranding along the rectosigmoid colon which is new from previous. There is significant perirectal fat and presacral space stranding. Edema tracks along the pelvic sidewalls as well. Edema tracks along the lower abdominal retroperitoneum. Vascular/Lymphatic: Normal caliber aorta and IVC. Once again there are several abnormal nodes previously identified. These include a aortocaval node which previously had a short axis of 7 mm and today on series 7, image 114 measures 9 mm. Left common iliac chain node which previously had short axis of 11 mm, today measures 11 mm on image 135. Left pelvic sidewall node short axis of 11 mm, today measures 15 mm. This is larger but the node is more low in density diffusely. New right-sided pelvic sidewall node measures 15 mm on series 7, image 151. Nodes in the right hemipelvis are also more low in density. Additional retrocaval node on series 2, image 45 measures 10 mm in short axis today. Previously this node measured 4 mm in short axis. Reproductive: Status post hysterectomy. No adnexal masses. Other: Anasarca.  Trace ascites.  No obvious free air. Musculoskeletal: Mild degenerative changes along the spine. Degenerative changes along the pelvis. Stable sclerotic areas involving the spine at L2. IMPRESSION: Mass lesion extending along the inferior aspect of the pancreatic head and uncinate process is overall similar to previous. 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 Multiple collaterals identified. Multiple liver lesions are again seen.  There are several which have increased in size from previous close with progression. Several abnormal lymph nodes identified previously the retroperitoneum and in the pelvis have increased in size. However many are lower in density and there is more adjacent stranding particularly in the pelvis. Extensive edema along the pelvis including the pelvic sidewalls but also tracking along the perirectal and presacral spaces. There is significant wall thickening along the rectosigmoid colon. Please correlate for a colitis or other process. No new mass lesion, fluid collection or nodal enlargement in the thorax. Electronically Signed   By: Karen Kays M.D.   On: 03/23/2023 17:40

## 2023-04-17 NOTE — Plan of Care (Signed)
  Problem: Clinical Measurements: Goal: Ability to maintain clinical measurements within normal limits will improve Outcome: Progressing   Problem: Nutrition: Goal: Adequate nutrition will be maintained Outcome: Progressing   Problem: Coping: Goal: Level of anxiety will decrease Outcome: Progressing   Problem: Elimination: Goal: Will not experience complications related to bowel motility Outcome: Progressing

## 2023-04-18 ENCOUNTER — Inpatient Hospital Stay (HOSPITAL_COMMUNITY)

## 2023-04-18 ENCOUNTER — Encounter (HOSPITAL_COMMUNITY): Payer: Self-pay | Admitting: Radiology

## 2023-04-18 DIAGNOSIS — Z515 Encounter for palliative care: Secondary | ICD-10-CM

## 2023-04-18 DIAGNOSIS — R109 Unspecified abdominal pain: Secondary | ICD-10-CM | POA: Diagnosis not present

## 2023-04-18 LAB — CBC
HCT: 27.9 % — ABNORMAL LOW (ref 36.0–46.0)
Hemoglobin: 8.3 g/dL — ABNORMAL LOW (ref 12.0–15.0)
MCH: 27.7 pg (ref 26.0–34.0)
MCHC: 29.7 g/dL — ABNORMAL LOW (ref 30.0–36.0)
MCV: 93 fL (ref 80.0–100.0)
Platelets: 138 10*3/uL — ABNORMAL LOW (ref 150–400)
RBC: 3 MIL/uL — ABNORMAL LOW (ref 3.87–5.11)
RDW: 16.9 % — ABNORMAL HIGH (ref 11.5–15.5)
WBC: 6.6 10*3/uL (ref 4.0–10.5)
nRBC: 0 % (ref 0.0–0.2)

## 2023-04-18 LAB — BASIC METABOLIC PANEL
Anion gap: 10 (ref 5–15)
BUN: 9 mg/dL (ref 6–20)
CO2: 24 mmol/L (ref 22–32)
Calcium: 8.2 mg/dL — ABNORMAL LOW (ref 8.9–10.3)
Chloride: 102 mmol/L (ref 98–111)
Creatinine, Ser: 0.87 mg/dL (ref 0.44–1.00)
GFR, Estimated: >60 mL/min (ref >60)
Glucose, Bld: 111 mg/dL — ABNORMAL HIGH (ref 70–99)
Potassium: 3.5 mmol/L (ref 3.5–5.1)
Sodium: 136 mmol/L (ref 135–145)

## 2023-04-18 LAB — MAGNESIUM: Magnesium: 2.1 mg/dL (ref 1.7–2.4)

## 2023-04-18 MED ORDER — FENTANYL CITRATE (PF) 100 MCG/2ML IJ SOLN
INTRAMUSCULAR | Status: AC
  Filled 2023-04-18: qty 4

## 2023-04-18 MED ORDER — MIDAZOLAM HCL 2 MG/2ML IJ SOLN
INTRAMUSCULAR | Status: AC
  Filled 2023-04-18: qty 2

## 2023-04-18 MED ORDER — LIDOCAINE HCL 1 % IJ SOLN
INTRAMUSCULAR | Status: AC | PRN
  Administered 2023-04-18: 10 mL via INTRADERMAL

## 2023-04-18 MED ORDER — FENTANYL CITRATE (PF) 100 MCG/2ML IJ SOLN
INTRAMUSCULAR | Status: AC
  Filled 2023-04-18: qty 2

## 2023-04-18 MED ORDER — POTASSIUM CHLORIDE CRYS ER 20 MEQ PO TBCR
40.0000 meq | EXTENDED_RELEASE_TABLET | Freq: Once | ORAL | Status: AC
  Administered 2023-04-18: 40 meq via ORAL
  Filled 2023-04-18: qty 2

## 2023-04-18 MED ORDER — TRIAMCINOLONE ACETONIDE 40 MG/ML IJ SUSP
80.0000 mg | Freq: Once | INTRAMUSCULAR | Status: AC
Start: 2023-04-18 — End: 2023-04-18
  Administered 2023-04-18: 80 mg
  Filled 2023-04-18: qty 2

## 2023-04-18 MED ORDER — IOHEXOL 300 MG/ML  SOLN
30.0000 mL | Freq: Once | INTRAMUSCULAR | Status: DC | PRN
Start: 2023-04-18 — End: 2023-04-18

## 2023-04-18 MED ORDER — BUPIVACAINE HCL (PF) 0.5 % IJ SOLN
INTRAMUSCULAR | Status: AC
  Filled 2023-04-18: qty 30

## 2023-04-18 MED ORDER — TRIAMCINOLONE ACETONIDE 40 MG/ML IJ SUSP
INTRAMUSCULAR | Status: AC
  Filled 2023-04-18: qty 2

## 2023-04-18 MED ORDER — FENTANYL CITRATE (PF) 100 MCG/2ML IJ SOLN
INTRAMUSCULAR | Status: AC | PRN
  Administered 2023-04-18 (×2): 50 ug via INTRAVENOUS

## 2023-04-18 MED ORDER — MIDAZOLAM HCL 2 MG/2ML IJ SOLN
INTRAMUSCULAR | Status: AC
Start: 2023-04-18 — End: ?
  Filled 2023-04-18: qty 4

## 2023-04-18 MED ORDER — POLYETHYLENE GLYCOL 3350 17 G PO PACK
17.0000 g | PACK | Freq: Two times a day (BID) | ORAL | Status: DC
  Administered 2023-04-18 (×2): 17 g via ORAL
  Filled 2023-04-18 (×2): qty 1

## 2023-04-18 MED ORDER — MIDAZOLAM HCL 2 MG/2ML IJ SOLN
INTRAMUSCULAR | Status: AC | PRN
  Administered 2023-04-18 (×2): 1 mg via INTRAVENOUS

## 2023-04-18 MED ORDER — BUPIVACAINE HCL (PF) 0.5 % IJ SOLN
30.0000 mL | Freq: Once | INTRAMUSCULAR | Status: AC
Start: 2023-04-18 — End: 2023-04-18
  Administered 2023-04-18: 30 mL
  Filled 2023-04-18: qty 30

## 2023-04-18 NOTE — Progress Notes (Signed)
 PROGRESS NOTE    Kaitlin Ferrell  ZOX:[id number] DOB: [birth year] DOA: 04/14/2023 PCP: Bernadette Hoit, MD    Brief Narrative:   Kaitlin Ferrell is a 58 y.o. female with past medical history significant for metastatic adenocarcinoma of the pancreas (Dx 03/22/2022, follows with Dr. Leonides Schanz, on FOLFIRINOX q2weeks), hypertension, GERD, portal vein occlusion and right upper extremity DVT (05/2022 on Eliquis) who presented to the emergency department complaints of progressively worsening abdominal pain over the last week despite utilizing laxative and enemas outpatient.  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 Dr. Cardell Peach with urology was consulted due to the bilateral hydronephrosis. The hospitalist group was then called to assess the patient for admission to the hospital.   Assessment & Plan:   Intractable abdominal pain Patient presenting to ED with progressive severe bilateral flank, lower abdominal pain.  Imaging notable for progressive tumor burden in the pelvis with worsening bilateral hydronephrosis.  Patient underwent bilateral nephrostomy tube placement by IR on 04/17/2023. -- Palliative care, medical oncology following; appreciate assistance -- IR consulted for celiac plexus block; pending this afternoon -- MS Contin 60 mg PO q8h -- Dilaudid 2-4 mg PO q4h PRN moderate/severe pain -- Dilaudid 1 - 2 mg IV q3h PRN moderate/severe breakthrough pain  Bilateral hydronephrosis CT abdomen/pelvis with interval development of moderate bilateral hydroureteronephrosis  without evidence of obstructing calculus, likely due to external compression.  Seen by urology, Dr. Cardell Peach with recommendation of bilateral nephrostomy tube placement.  IR was consulted and patient went percutaneous bilateral nephrostomy tube placement on 04/17/2023. -- Continue to monitor urine output  Metastatic pancreatic adenocarcinoma Patient follows with medical oncology outpatient, Dr. Leonides Schanz.  Imaging on admission shows substantial progression of metastatic pancreatic cancer with multiple developing complications including bilateral hydronephrosis, moderate/severe wall thickening of the rectum and distal sigmoid colon.  Overall prognosis is poor and not a candidate for future chemotherapy. -- Medical oncology, palliative care following, appreciate assistance  Constipation Large amount of stool throughout colon on imaging likely secondary to progressive tumor burden and ongoing opiates needs. -- Discontinue lactulose as she is refusing -- Senokot-S 2 tabs twice daily -- MiraLAX twice daily  Hypokalemia Repleted, potassium 3.6 today.  Normocytic anemia Anemia panel with iron 29, TIBC low at 158, ferritin 70, B12 2000 484, folate 8.9. -- Hemoglobin 8.3, stable. -- Transfuse for hemoglobin less than 7.0 -- CBC in am  Thrombocytopenia -- Plt 153>120>130>104>138 -- CBC daily  GERD -- Protonix 40 mg p.o. daily   DVT prophylaxis: SCDs Start: 04/14/23 1404    Code Status: Full Code Family Communication: No family present at bedside this morning  Disposition Plan:  Level of care: Med-Surg Status is: Inpatient Remains inpatient appropriate because: IR for celiac plexus block today, anticipate discharge home in 1-2 days of pain controlled    Consultants:  Medical oncology Palliative care Urology, Dr. Cardell Peach Interventional radiology  Procedures:  Bilateral percutaneous nephrostomy tube, IR 3/10 For celiac plexus block: Pending  Antimicrobials:  Ceftriaxone 3/7>> Metronidazole  3/7>>  Subjective: Patient seen examined bedside, lying in bed.  Patient reports pain about the same as yesterday even with nephrostomy tube  placement.  Pending celiac plexus block by IR this afternoon.  No bowel movements past 24 hours; but has been refusing lactulose due to "taste".  Discontinue lactulose in favor of MiraLAX.  Remains on Senokot.  No other specific questions, concerns or complaints at this time.  Denies headache, no fever/chills/night events, no nausea/vomiting/diarrhea, no chest pain, no palpitations, no shortness of breath, no focal weakness, no fatigue, no paresthesias.  No acute events overnight per nursing staff.  Objective: Vitals:   04/17/23 1353 04/17/23 2041 04/18/23 0559 04/18/23 0857  BP: (!) 148/78 (!) 143/88 129/74 (!) 143/87  Pulse: 97 95 79 93  Resp: 18 18 16 16   Temp: 98.1 F (36.7 C) 98.9 F (37.2 C) 98.6 F (37 C) 97.8 F (36.6 C)  TempSrc: Oral Oral Oral Oral  SpO2: 97% 100% 98% 100%  Weight:      Height:        Intake/Output Summary (Last 24 hours) at 04/18/2023 1020 Last data filed at 04/18/2023 0858 Gross per 24 hour  Intake 400 ml  Output 3150 ml  Net -2750 ml   Filed Weights   04/14/23 0630  Weight: 52.6 kg    Examination:  Physical Exam: GEN: NAD, alert and oriented x 3, wd/wn HEENT: NCAT, PERRL, EOMI, sclera clear, MMM PULM: CTAB w/o wheezes/crackles, normal respiratory effort, on room air CV: RRR w/o M/G/R GI: abd soft, slight TTP bilateral flank, nondistended, +BS; bilateral nephrostomy tubes noted draining light clear yellow urine in collection bags MSK: no peripheral edema, muscle strength globally intact 5/5 bilateral upper/lower extremities NEURO: No focal neurological deficits PSYCH: normal mood/affect Integumentary: d no concerning rashes/lesions/wounds noted on exposed skin surfaces    Data Reviewed: I have personally reviewed following labs and imaging studies  CBC: Recent Labs  Lab 04/14/23 0812 04/15/23 0306  04/16/23 0321 04/17/23 0323 04/18/23 0312  WBC 5.6 5.8 5.1 5.9 6.6  NEUTROABS  --   --  3.1 3.7  --   HGB 10.4* 8.0* 7.9* 7.9* 8.3*  HCT 34.7* 27.1* 27.0* 27.1* 27.9*  MCV 92.3 93.4 94.4 94.4 93.0  PLT 153 120* 130* 104* 138*   Basic Metabolic Panel: Recent Labs  Lab 04/14/23 0812 04/15/23 0306 04/16/23 0321 04/17/23 0323 04/18/23 0312  NA 136 138 139 138 136  K 3.1* 3.7 3.8 3.6 3.5  CL 100 105 107 105 102  CO2 25 26 26 25 24   GLUCOSE 105* 123* 97 106* 111*  BUN 11 11 9 8 9   CREATININE 1.10* 0.91 1.00 1.05* 0.87  CALCIUM 9.1 8.0* 8.3* 8.3* 8.2*  MG 2.5*  --  2.4  --  2.1  PHOS 3.3  --   --   --   --    GFR: Estimated Creatinine Clearance: 57.1 mL/min (by C-G formula based on SCr of 0.87 mg/dL). Liver Function Tests: Recent Labs  Lab 04/14/23 0812 04/15/23 0306 04/16/23 0321 04/17/23 0323  AST 18 14* 13* 14*  ALT 9 8 8 6   ALKPHOS 92 64 62 70  BILITOT 0.7 0.5 0.7 0.7  PROT 8.1 6.6 6.3* 6.1*  ALBUMIN 4.0 3.0* 2.9* 2.8*   Recent Labs  Lab 04/14/23 0812  LIPASE 21   No results for input(s): "AMMONIA" in the last 168 hours. Coagulation Profile: Recent Labs  Lab 04/17/23 0323  INR 1.3*   Cardiac Enzymes: No results for input(s): "CKTOTAL", "CKMB", "CKMBINDEX", "TROPONINI" in the last 168 hours. BNP (last 3 results) No results for input(s): "PROBNP" in the last 8760 hours.  HbA1C: No results for input(s): "HGBA1C" in the last 72 hours. CBG: No results for input(s): "GLUCAP" in the last 168 hours. Lipid Profile: No results for input(s): "CHOL", "HDL", "LDLCALC", "TRIG", "CHOLHDL", "LDLDIRECT" in the last 72 hours. Thyroid Function Tests: No results for input(s): "TSH", "T4TOTAL", "FREET4", "T3FREE", "THYROIDAB" in the last 72 hours. Anemia Panel: Recent Labs    04/16/23 0321  VITAMINB12 2,484*  FOLATE 8.9  FERRITIN 70  TIBC 158*  IRON 29   Sepsis Labs: No results for input(s): "PROCALCITON", "LATICACIDVEN" in the last 168 hours.  No results  found for this or any previous visit (from the past 240 hours).       Radiology Studies: IR NEPHROSTOMY PLACEMENT BILATERAL Result Date: 04/17/2023 CLINICAL DATA:  Metastatic pancreatic adenocarcinoma. New bilateral moderately severe hydronephrosis, acute abdominal pain EXAM: BILATERAL PERCUTANEOUS NEPHROSTOMY CATHETER PLACEMENT UNDER ULTRASOUND AND FLUOROSCOPIC GUIDANCE FLUOROSCOPY: Radiation Exposure Index (as provided by the fluoroscopic device): 3 mGy air Kerma TECHNIQUE: The procedure, risks (including but not limited to bleeding, infection, organ damage ), benefits, and alternatives were explained to the patient. Questions regarding the procedure were encouraged and answered. The patient understands and consents to the procedure. bilateralflank regions prepped with Betadine, draped in usual sterile fashion, infiltrated locally with 1% lidocaine. As antibiotic prophylaxis, Rocephin 2 g was ordered pre-procedure and administered intravenously within one hour of incision. Intravenous Fentanyl and Versed 2mg  were administered by RN during a total moderate (conscious) sedation time of 18 minutes; the patient's level of consciousness and physiological / cardiorespiratory status were monitored continuously by radiology RN under my direct supervision. Under real-time ultrasound guidance, a 21-gauge micropuncture needle was advanced into a right posterior lower pole calyx. Ultrasound image documentation was saved. Urine spontaneously returned through the needle. Needle was exchanged over a guidewire for transitional dilator. Contrast injection confirmed appropriate positioning. Catheter was exchanged over a guidewire for a 10 French pigtail catheter, formed centrally within the right renal collecting system. Contrast injection confirms appropriate positioning and patency. In similar fashion, Under real-time ultrasound guidance, a 21-gauge micropuncture needle was advanced into a left posterior lower pole  calyx. Ultrasound image documentation was saved. Urine spontaneously returned through the needle. Needle was exchanged over a guidewire for transitional dilator. Contrast injection confirmed appropriate positioning. Catheter was exchanged over a guidewire for a 10 French pigtail catheter, formed centrally within the left renal collecting system. Contrast injection confirms appropriate positioning and patency. Both catheters were secured externally with 0 Prolene sutures and placed to external drain bags. The patient tolerated the procedure well. COMPLICATIONS: COMPLICATIONS none IMPRESSION: 1. Technically successful bilateral percutaneous nephrostomy catheter placement. Electronically Signed   By: Corlis Leak M.D.   On: 04/17/2023 16:36        Scheduled Meds:  Chlorhexidine Gluconate Cloth  6 each Topical Daily   morphine  60 mg Oral Q8H   pantoprazole  40 mg Oral Daily   polyethylene glycol  17 g Oral BID   senna-docusate  2 tablet Oral TID   sodium chloride flush  10-40 mL Intracatheter Q12H   sodium chloride flush  5 mL Intracatheter Q8H   Continuous Infusions:  cefTRIAXone (ROCEPHIN)  IV 2 g (04/17/23 1300)   metronidazole 500 mg (04/18/23 0808)     LOS: 4 days    Time spent: 49 minutes spent on 04/18/2023 caring for this patient face-to-face including chart review, ordering labs/tests, documenting, discussion with nursing staff, consultants, updating family and interview/physical exam    Alvira Philips [place], DO  [facility] Available via Epic secure chat 7am-7pm After these hours, please refer to coverage provider listed on [email or web address] 04/18/2023, 10:20 AM

## 2023-04-18 NOTE — Progress Notes (Signed)
  Daily Progress Note   Patient Name: Kaitlin Ferrell       Date: 04/18/2023 DOB: [birth year]  Age: 58 y.o. MRN#: [id number] Attending Physician: [place], Eric J, DO Primary Care Physician: Bernadette Hoit, MD Admit Date: 04/14/2023 Length of Stay: 4 days  Attempted to visit with patient today.  Patient did not receive celiac plexus nerve block yesterday so planned for this afternoon.  When presenting to bedside to see patient, patient was out a room for nerve block procedure.  Noted palliative medicine team planning to follow-up tomorrow to continue assessments regarding pain management. Did not want to make major opioid changes until nerve block performed to determine efficacy. Oncology, Dr. Leonides Schanz, also planning to engage with patient regarding medical care moving forward.   Alvester Morin, DO Palliative Care Provider PMT # [phone]  No Charge Note

## 2023-04-18 NOTE — Progress Notes (Addendum)
 Kaitlin Ferrell   DOB:27-Aug-1965   MV#:[id number]      ASSESSMENT & PLAN:  Metastatic pancreatic cancer to liver, lymph nodes Peritoneal carcinomatosis Bilateral hydronephrosis, new onset - Patient refractory to FOLFIRINOX and gem/Abraxane chemotherapy - Recent outpatient oncology follow-up indicates patient currently on palliative measures only.  Has not received chemotherapy since a month ago - CT scan done 04/14/2023 shows slight disease progression however signs of new bilateral hydronephrosis - statup post bil nephrostomy cath placement 04/17/23 by IR, draining well - Palliative following - Medical oncology/Dr. Leonides Schanz following   Pain, cancer associated - Complains of ongoing abdominal and back pain, today lower abdominal pain is worse.   - Pending celiac plexus block today by IR  - Palliative following for pain management   Anemia Thrombocytopenia - Likely due to malignancy - Hemoglobin 8.3 today.  Transfuse PRBC for Hgb <7.0.  No transfusional intervention required at this time. - Platelets improving 138K today.  Mild.  No transfusional intervention required at this time - Continue to monitor CBC with differential      Code Status Full  Subjective:  Seen awake and alert laying in bed, very pleasant.  Bilateral nephrostomy tubes draining well.  Reports lower abdominal pain is worse today and also has severe back pain.  Looking forward to nerve block.  No other acute distress noted.  Objective:  Vitals:   04/18/23 0559 04/18/23 0857  BP: 129/74 (!) 143/87  Pulse: 79 93  Resp: 16 16  Temp: 98.6 F (37 C) 97.8 F (36.6 C)  SpO2: 98% 100%     Intake/Output Summary (Last 24 hours) at 04/18/2023 1021 Last data filed at 04/18/2023 2841 Gross per 24 hour  Intake 400 ml  Output 3150 ml  Net -2750 ml     REVIEW OF SYSTEMS:   Constitutional: Denies fevers, chills or abnormal night sweats Eyes: Denies blurriness of vision, double vision or watery eyes Ears,  nose, mouth, throat, and face: Denies mucositis or sore throat Respiratory: Denies cough, dyspnea or wheezes Cardiovascular: Denies palpitation, chest discomfort or lower extremity swelling Gastrointestinal: + Lower abdominal pain + back pain Skin: Denies abnormal skin rashes Lymphatics: Denies new lymphadenopathy or easy bruising Neurological: Denies numbness, tingling or new weaknesses Behavioral/Psych: Mood is stable, no new changes  All other systems were reviewed with the patient and are negative.  PHYSICAL EXAMINATION: ECOG PERFORMANCE STATUS: 2 - Symptomatic, <50% confined to bed  Vitals:   04/18/23 0559 04/18/23 0857  BP: 129/74 (!) 143/87  Pulse: 79 93  Resp: 16 16  Temp: 98.6 F (37 C) 97.8 F (36.6 C)  SpO2: 98% 100%   Filed Weights   04/14/23 0630  Weight: 116 lb (52.6 kg)    GENERAL: alert, + moderate distress and comfortable SKIN: skin color, texture, turgor are normal, no rashes or significant lesions EYES: normal, conjunctiva are pink and non-injected, sclera clear OROPHARYNX: no exudate, no erythema and lips, buccal mucosa, and tongue normal  NECK: supple, thyroid normal size, non-tender, without nodularity LYMPH: no palpable lymphadenopathy in the cervical, axillary or inguinal LUNGS: clear to auscultation and percussion with normal breathing effort HEART: regular rate & rhythm and no murmurs and no lower extremity edema ABDOMEN: + Bilateral neph tubes intact  MUSCULOSKELETAL: no cyanosis of digits and no clubbing  PSYCH: alert & oriented x 3 with fluent speech NEURO: no focal motor/sensory deficits   All questions were answered. The patient knows to call the clinic with any problems, questions or concerns.  The total time spent in the appointment was 40 minutes encounter with patient including review of chart and various tests results, discussions about plan of care and coordination of care plan  Dawson Bills, NP 04/18/2023 10:21 AM    Labs  Reviewed:  Lab Results  Component Value Date   WBC 6.6 04/18/2023   HGB 8.3 (L) 04/18/2023   HCT 27.9 (L) 04/18/2023   MCV 93.0 04/18/2023   PLT 138 (L) 04/18/2023   Recent Labs    04/15/23 0306 04/16/23 0321 04/17/23 0323 04/18/23 0312  NA 138 139 138 136  K 3.7 3.8 3.6 3.5  CL 105 107 105 102  CO2 26 26 25 24   GLUCOSE 123* 97 106* 111*  BUN 11 9 8 9   CREATININE 0.91 1.00 1.05* 0.87  CALCIUM 8.0* 8.3* 8.3* 8.2*  GFRNONAA >60 >60 >60 >60  PROT 6.6 6.3* 6.1*  --   ALBUMIN 3.0* 2.9* 2.8*  --   AST 14* 13* 14*  --   ALT 8 8 6   --   ALKPHOS 64 62 70  --   BILITOT 0.5 0.7 0.7  --     Studies Reviewed:  IR NEPHROSTOMY PLACEMENT BILATERAL Result Date: 04/17/2023 CLINICAL DATA:  Metastatic pancreatic adenocarcinoma. New bilateral moderately severe hydronephrosis, acute abdominal pain EXAM: BILATERAL PERCUTANEOUS NEPHROSTOMY CATHETER PLACEMENT UNDER ULTRASOUND AND FLUOROSCOPIC GUIDANCE FLUOROSCOPY: Radiation Exposure Index (as provided by the fluoroscopic device): 3 mGy air Kerma TECHNIQUE: The procedure, risks (including but not limited to bleeding, infection, organ damage ), benefits, and alternatives were explained to the patient. Questions regarding the procedure were encouraged and answered. The patient understands and consents to the procedure. bilateralflank regions prepped with Betadine, draped in usual sterile fashion, infiltrated locally with 1% lidocaine. As antibiotic prophylaxis, Rocephin 2 g was ordered pre-procedure and administered intravenously within one hour of incision. Intravenous Fentanyl and Versed 2mg  were administered by RN during a total moderate (conscious) sedation time of 18 minutes; the patient's level of consciousness and physiological / cardiorespiratory status were monitored continuously by radiology RN under my direct supervision. Under real-time ultrasound guidance, a 21-gauge micropuncture needle was advanced into a right posterior lower pole calyx.  Ultrasound image documentation was saved. Urine spontaneously returned through the needle. Needle was exchanged over a guidewire for transitional dilator. Contrast injection confirmed appropriate positioning. Catheter was exchanged over a guidewire for a 10 French pigtail catheter, formed centrally within the right renal collecting system. Contrast injection confirms appropriate positioning and patency. In similar fashion, Under real-time ultrasound guidance, a 21-gauge micropuncture needle was advanced into a left posterior lower pole calyx. Ultrasound image documentation was saved. Urine spontaneously returned through the needle. Needle was exchanged over a guidewire for transitional dilator. Contrast injection confirmed appropriate positioning. Catheter was exchanged over a guidewire for a 10 French pigtail catheter, formed centrally within the left renal collecting system. Contrast injection confirms appropriate positioning and patency. Both catheters were secured externally with 0 Prolene sutures and placed to external drain bags. The patient tolerated the procedure well. COMPLICATIONS: COMPLICATIONS none IMPRESSION: 1. Technically successful bilateral percutaneous nephrostomy catheter placement. Electronically Signed   By: Corlis Leak M.D.   On: 04/17/2023 16:36   CT ABDOMEN PELVIS W CONTRAST Result Date: 04/14/2023 CLINICAL DATA:  Acute generalized abdominal pain. History of pancreatic cancer. EXAM: CT ABDOMEN AND PELVIS WITH CONTRAST TECHNIQUE: Multidetector CT imaging of the abdomen and pelvis was performed using the standard protocol following bolus administration of intravenous contrast. RADIATION DOSE REDUCTION:  This exam was performed according to the departmental dose-optimization program which includes automated exposure control, adjustment of the mA and/or kV according to patient size and/or use of iterative reconstruction technique. CONTRAST:  OMNIPAQUE IOHEXOL 300 MG/ML  SOLN COMPARISON:   March 20, 2023. FINDINGS: Lower chest: No acute abnormality. Hepatobiliary: No cholelithiasis or biliary dilatation is noted. Multiple hepatic lesions are again noted consistent with metastatic disease. The largest measures 2.8 x 2.2 cm in posterior segment of right hepatic lobe which is enlarged compared to prior exam. 2.6 x 2.6 cm lesion is noted in superior portion of right hepatic lobe which is enlarged compared to prior exam as well. Pancreas: 3.5 x 2.3 cm pancreatic head mass is noted consistent with malignancy. Mild dilatation of common bile duct is noted. 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 It also seems to surround the proximal portion of the superior mesenteric artery, but it remains patent. Spleen: Normal in size without focal abnormality. Adrenals/Urinary Tract: Adrenal glands are unremarkable. Decreased enhancement of left kidney is noted. Moderate bilateral hydroureteronephrosis is noted without evidence of obstructing calculus, concerning for distal ureteral occlusion. Urinary bladder is only minimally distended. Stomach/Bowel: The stomach is unremarkable. There is no evidence of small bowel dilatation. Large amount of stool and fluid is seen throughout the colon. The appendix appears normal. There appears to be moderate to severe wall thickening of the rectum and possibly distal sigmoid colon suggesting inflammation or possibly malignancy. There are noted probable enhancing soft tissue lesions in this area suggesting peritoneal carcinomatosis. Vascular/Lymphatic: Abdominal aorta is unremarkable. 1.4 cm right external iliac lymph node is noted concerning for metastatic disease. 8 mm left periaortic lymph node is noted. 9 mm right periaortic lymph node is noted as well. Reproductive: Status post hysterectomy. No adnexal masses. Other: No ascites or hernia is noted. Musculoskeletal: No acute or significant osseous findings.  IMPRESSION: There is interval development of moderate bilateral hydroureteronephrosis without evidence of obstructing calculus. This most likely is due to obstruction of distal ureters due to external compression, most likely due to possible peritoneal implants or other metastatic disease in the pelvis. There is noted severe wall thickening of the rectum and probable distal sigmoid colon due to either inflammation or metastatic disease. Hepatic metastatic lesions are again noted which appear to be increased in size compared to prior exam. 3.5 x 2.3 cm pancreatic head mass is noted consistent with malignancy. 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 This mass also appears to encase the superior mesenteric artery, but the artery remains patent. Retroperitoneal and right external iliac adenopathy is noted consistent with metastatic disease. Electronically Signed   By: Lupita Raider M.D.   On: 04/14/2023 12:41   DG Abdomen 1 View Result Date: 04/14/2023 CLINICAL DATA:  Abdominal pain and constipation EXAM: ABDOMEN - 1 VIEW COMPARISON:  CT abdomen and pelvis dated 03/20/2023 FINDINGS: Nonobstructive bowel gas pattern. No pneumatosis. Large volume stool throughout the colon. No abnormal radio-opaque calculi or mass effect. No acute or substantial osseous abnormality. The sacrum and coccyx are partially obscured by overlying bowel contents. IMPRESSION: Large volume stool throughout the colon. Electronically Signed   By: Agustin Cree M.D.   On: 04/14/2023 10:01   CT CHEST ABDOMEN PELVIS W CONTRAST Result Date: 03/23/2023 CLINICAL DATA:  Pancreatic adenocarcinoma restaging. * Tracking Code: BO * EXAM: CT CHEST, ABDOMEN, AND PELVIS  WITH CONTRAST TECHNIQUE: Multidetector CT imaging of the chest, abdomen and pelvis was performed following the standard protocol during bolus administration of intravenous contrast. RADIATION DOSE REDUCTION: This exam was  performed according to the departmental dose-optimization program which includes automated exposure control, adjustment of the mA and/or kV according to patient size and/or use of iterative reconstruction technique. CONTRAST:  OMNIPAQUE IOHEXOL 300 MG/ML  SOLN COMPARISON:  CT 01/20/2023 and older. FINDINGS: CT CHEST FINDINGS Cardiovascular: Left upper chest port in place. Tip extending into the right atrium. Heart is nonenlarged. No pericardial effusion. The thoracic aorta is normal course and caliber with slight atherosclerotic plaque. Calcified aorta. Bovine type aortic arch. Mediastinum/Nodes: No specific abnormal lymph node enlargement present in the axillary regions, hilum or mediastinum. Normal caliber thoracic esophagus. Preserved thyroid gland. Lungs/Pleura: Bilateral apical pleural thickening identified. No consolidation, pneumothorax or effusion. Punctate calcifications with reticular changes along the lateral right lower lobe is unchanged from previous and possibly sequela of old infectious or inflammatory process. Musculoskeletal: Mild degenerative changes along the spine. A stable area of sclerosis along the T9 vertebral body. CT ABDOMEN PELVIS FINDINGS Hepatobiliary: Liver metastases are again seen. These are scattered throughout the liver. Specific lesions will be followed for continuity. Example peripheral right lower lobe measuring 15 x 15 mm previously, today on series 2, image 36 measures 2.1 x 2.0 cm. Segment 4A lesion which previously measured 10 x 7 mm, today is similar on series 7, image 80. However lobe lateral to this is a larger lesion which previously measured 15 x 11 mm and today on series 7, image 81 18 x 17 mm. Other lesions are also increased in size. Patent portal vein. Gallbladder is nondilated. Pancreas: Slight pancreatic duct ectasia. Relatively preserved pancreatic parenchyma. Along the inferior aspect however of the pancreatic head and uncinate process is a locally  infiltrative mass. Previously this was measured at 4.1 x 3.0 cm. When remeasured today using the same technique this mass encasing the SMA on series 7, image 105 measures 4.0 x 3.3 cm. Similar. Measurements are difficult due to the nature of the lesion and location. 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 Spleen: Normal in size without focal abnormality. Adrenals/Urinary Tract: Adrenal glands are unremarkable. Kidneys are normal, without renal calculi, focal lesion, or hydronephrosis. Bladder is underdistended but has some wall thickening. Adjacent stranding. Stomach/Bowel: On this non oral contrast exam the stomach is nondilated. Small bowel is nondilated. There are loops of small bowel stool appearance in the pelvis, nonspecific. The cecum resides in the anterior low pelvis just above the bladder. Diffuse colonic stool identified. There is presence of wall thickening with some stranding along the rectosigmoid colon which is new from previous. There is significant perirectal fat and presacral space stranding. Edema tracks along the pelvic sidewalls as well. Edema tracks along the lower abdominal retroperitoneum. Vascular/Lymphatic: Normal caliber aorta and IVC. Once again there are several abnormal nodes previously identified. These include a aortocaval node which previously had a short axis of 7 mm and today on series 7, image 114 measures 9 mm. Left common iliac chain node which previously had short axis of 11 mm, today measures 11 mm on image 135. Left pelvic sidewall node short axis of 11 mm, today measures 15 mm. This is larger but the node is more low in density diffusely. New right-sided pelvic sidewall node measures 15 mm on series 7, image 151. Nodes in  the right hemipelvis are also more low in density. Additional retrocaval node on series 2, image 45 measures 10 mm in short axis today. Previously this node  measured 4 mm in short axis. Reproductive: Status post hysterectomy. No adnexal masses. Other: Anasarca.  Trace ascites.  No obvious free air. Musculoskeletal: Mild degenerative changes along the spine. Degenerative changes along the pelvis. Stable sclerotic areas involving the spine at L2. IMPRESSION: Mass lesion extending along the inferior aspect of the pancreatic head and uncinate process is overall similar to previous. 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 Multiple collaterals identified. Multiple liver lesions are again seen. There are several which have increased in size from previous close with progression. Several abnormal lymph nodes identified previously the retroperitoneum and in the pelvis have increased in size. However many are lower in density and there is more adjacent stranding particularly in the pelvis. Extensive edema along the pelvis including the pelvic sidewalls but also tracking along the perirectal and presacral spaces. There is significant wall thickening along the rectosigmoid colon. Please correlate for a colitis or other process. No new mass lesion, fluid collection or nodal enlargement in the thorax. Electronically Signed   By: Karen Kays M.D.   On: 03/23/2023 17:40

## 2023-04-18 NOTE — Procedures (Signed)
 Vascular and Interventional Radiology Procedure Note  Patient: Kaitlin Ferrell DOB: 01-15-1966 Medical Record Number: [id number] Note Date/Time: 04/18/23 2:34 PM   Performing Physician: Roanna Banning, MD Assistant(s): None  Diagnosis: Pancreatic mass with intractable abdominal pain.    Procedure: CELIAC PLEXUS NERVE BLOCK   Anesthesia: Conscious Sedation Complications: None Estimated Blood Loss:  0 mL Specimens: Sent for None   Findings:  Successful CT-guided celiac plexus nerve block, by a bilateral posterior approach. 40 mg/mL Kenalog and 0.5% Bupivacaine was administed Hemostasis of the tract was achieved using Manual Pressure.   Plan: Bed rest for 2  hours.  See detailed procedure note with images in PACS. The patient tolerated the procedure well without incident or complication and was returned to Recovery in stable condition.    Roanna Banning, MD [facility] Rad[facility]   Pager. [phone] Clinic. [phone]

## 2023-04-19 DIAGNOSIS — N1330 Unspecified hydronephrosis: Secondary | ICD-10-CM | POA: Diagnosis not present

## 2023-04-19 DIAGNOSIS — Z7189 Other specified counseling: Secondary | ICD-10-CM

## 2023-04-19 DIAGNOSIS — C799 Secondary malignant neoplasm of unspecified site: Secondary | ICD-10-CM

## 2023-04-19 DIAGNOSIS — K5900 Constipation, unspecified: Secondary | ICD-10-CM

## 2023-04-19 DIAGNOSIS — Z515 Encounter for palliative care: Secondary | ICD-10-CM | POA: Diagnosis not present

## 2023-04-19 DIAGNOSIS — R109 Unspecified abdominal pain: Secondary | ICD-10-CM | POA: Diagnosis not present

## 2023-04-19 DIAGNOSIS — G893 Neoplasm related pain (acute) (chronic): Secondary | ICD-10-CM

## 2023-04-19 LAB — URINALYSIS, ROUTINE W REFLEX MICROSCOPIC
Bacteria, UA: NONE SEEN
Bilirubin Urine: NEGATIVE
Glucose, UA: NEGATIVE mg/dL
Ketones, ur: NEGATIVE mg/dL
Nitrite: NEGATIVE
Protein, ur: 30 mg/dL — AB
RBC / HPF: >50 RBC/hpf (ref 0–5)
Specific Gravity, Urine: 1.021 (ref 1.005–1.030)
pH: 6 (ref 5.0–8.0)

## 2023-04-19 LAB — CBC
HCT: 29.2 % — ABNORMAL LOW (ref 36.0–46.0)
Hemoglobin: 8.9 g/dL — ABNORMAL LOW (ref 12.0–15.0)
MCH: 27.6 pg (ref 26.0–34.0)
MCHC: 30.5 g/dL (ref 30.0–36.0)
MCV: 90.7 fL (ref 80.0–100.0)
Platelets: 143 10*3/uL — ABNORMAL LOW (ref 150–400)
RBC: 3.22 MIL/uL — ABNORMAL LOW (ref 3.87–5.11)
RDW: 16.9 % — ABNORMAL HIGH (ref 11.5–15.5)
WBC: 5.8 10*3/uL (ref 4.0–10.5)
nRBC: 0 % (ref 0.0–0.2)

## 2023-04-19 LAB — BASIC METABOLIC PANEL
Anion gap: 6 (ref 5–15)
BUN: 10 mg/dL (ref 6–20)
CO2: 25 mmol/L (ref 22–32)
Calcium: 8.4 mg/dL — ABNORMAL LOW (ref 8.9–10.3)
Chloride: 105 mmol/L (ref 98–111)
Creatinine, Ser: 0.85 mg/dL (ref 0.44–1.00)
GFR, Estimated: >60 mL/min (ref >60)
Glucose, Bld: 182 mg/dL — ABNORMAL HIGH (ref 70–99)
Potassium: 4.4 mmol/L (ref 3.5–5.1)
Sodium: 136 mmol/L (ref 135–145)

## 2023-04-19 MED ORDER — POLYETHYLENE GLYCOL 3350 17 G PO PACK: 17.0000 g | PACK | Freq: Three times a day (TID) | ORAL | 0 refills | Status: DC

## 2023-04-19 MED ORDER — HEPARIN SOD (PORK) LOCK FLUSH 100 UNIT/ML IV SOLN
500.0000 [IU] | INTRAVENOUS | Status: AC | PRN
  Administered 2023-04-19: 500 [IU]
  Filled 2023-04-19: qty 5

## 2023-04-19 MED ORDER — POLYETHYLENE GLYCOL 3350 17 G PO PACK
17.0000 g | PACK | Freq: Three times a day (TID) | ORAL | Status: DC
  Administered 2023-04-19: 17 g via ORAL
  Filled 2023-04-19: qty 1

## 2023-04-19 MED ORDER — SODIUM CHLORIDE FLUSH 0.9 % IV SOLN: 5.0000 mL | INTRAVENOUS | 2 refills | Status: DC | PRN

## 2023-04-19 MED ORDER — SENNOSIDES-DOCUSATE SODIUM 8.6-50 MG PO TABS: 2.0000 | ORAL_TABLET | Freq: Three times a day (TID) | ORAL | 0 refills | Status: DC

## 2023-04-19 NOTE — Progress Notes (Signed)
 Referring Physician(s): Dr. Patterson Hammersmith  Supervising Physician: Roanna Banning  Patient Status:  [facility] [place] [facility] - In-pt  Chief Complaint: S/P celiac plexus nerve block for pancreatic mass with intractable abdominal pain, 04/18/2023  Subjective:  Spoke to patient and patient husband.  Patient reports complete resolution of abdominal pain, 0 out of 10 pain at this time.  Denies fever.  Endorses mild tenderness at insertion site of bilateral PCNs.  No concerns about PCNs at this time.  Patient and patient has been verbalized interest in moving forward with celiac plexus neurolysis.  Allergies: Compazine [prochlorperazine edisylate], Propofol, Oxycodone-acetaminophen, Prochlorperazine, and Labetalol  Medications: Prior to Admission medications   Medication Sig Start Date End Date Taking? Authorizing Provider  acetaminophen (TYLENOL) 500 MG tablet Take 1,000 mg by mouth every 6 (six) hours as needed for mild pain (pain score 1-3) or headache.   Yes [provider]  eletriptan (RELPAX) 40 MG tablet Take 1 tablet (40 mg total) by mouth as needed for migraine or headache. May repeat in 2 hours if needed. Patient taking differently: Take 40 mg by mouth as needed for migraine or headache (and may repeat in 2 hours, if no relief). 09/22/14  Yes Nilda Riggs, NP  HYDROmorphone (DILAUDID) 2 MG tablet Take 1 tablet (2 mg total) by mouth every 4 (four) hours as needed for severe pain (pain score 7-10). 04/10/23  Yes Pickenpack-Cousar, Arty Baumgartner, NP  lidocaine-prilocaine (EMLA) cream Apply 1 Application topically as needed. Patient taking differently: Apply 1 Application topically as needed (for port access). 09/29/22  Yes Pickenpack-Cousar, Arty Baumgartner, NP  LORazepam (ATIVAN) 0.5 MG tablet Take 0.5 mg by mouth daily as needed for anxiety. 03/22/22  Yes [provider]  magnesium oxide (MAG-OX) 400 (241.3 MG) MG tablet TAKE 1 TABLET BY MOUTH TWICE A DAY 11/16/14  Yes Nilda Riggs, NP  morphine  (MS CONTIN) 15 MG 12 hr tablet Take 1 tablet (15 mg total) by mouth every 8 (eight) hours. Patient taking differently: Take 30 mg by mouth See admin instructions. Take 30 mg by mouth at 6 AM, 2 PM, and 10 PM in conjunction with ONE 30 mg tablet to equal a total dose of 60 mg 04/13/23  Yes Pickenpack-Cousar, Athena N, NP  morphine (MS CONTIN) 30 MG 12 hr tablet Take 1 tablet (30 mg total) by mouth every 8 (eight) hours. Patient taking differently: Take 30 mg by mouth See admin instructions. Take 30 mg by mouth at 6 AM, 2 PM, and 10 PM in conjunction with TWO 15 mg tablets to equal a total dose of 60 mg 04/13/23  Yes Pickenpack-Cousar, Athena N, NP  OLANZapine (ZYPREXA) 10 MG tablet TAKE 1 TABLET BY MOUTH EVERYDAY AT BEDTIME Patient taking differently: Take 10 mg by mouth at bedtime. 04/20/22  Yes Georga Kaufmann T, PA-C  ondansetron (ZOFRAN) 8 MG tablet Take 1 tablet (8 mg total) by mouth every 8 (eight) hours as needed for nausea or vomiting. 03/14/22  Yes Georga Kaufmann T, PA-C  ondansetron (ZOFRAN-ODT) 8 MG disintegrating tablet Take 1 tablet (8 mg total) by mouth every 8 (eight) hours as needed for nausea or vomiting. Patient taking differently: Take 8 mg by mouth every 8 (eight) hours as needed for nausea or vomiting (dissolve orally). 05/17/22  Yes Walisiewicz, Kaitlyn E, PA-C  pantoprazole (PROTONIX) 40 MG tablet Take 40 mg by mouth daily as needed (for GERD-like symptoms). 03/17/23  Yes [provider]  promethazine (PHENERGAN) 25 MG tablet Take 1 tablet (25  mg total) by mouth every 6 (six) hours as needed for nausea or vomiting. 05/04/22  Yes Jaci Standard, MD  scopolamine (TRANSDERM-SCOP) 1 MG/3DAYS Place 1 patch (1.5 mg total) onto the skin every 3 (three) days. 07/22/22  Yes Georga Kaufmann T, PA-C  YUVAFEM 10 MCG TABS vaginal tablet Place 10 mcg vaginally 2 (two) times a week. 03/03/23  Yes [provider]  citalopram (CELEXA) 10 MG tablet Take 1 tablet (10 mg total) by mouth  daily. Patient not taking: Reported on 04/14/2023 04/11/23   Bernadette Hoit, MD  LORazepam (ATIVAN) 1 MG tablet Take 1 tablet (1 mg total) by mouth every 8 (eight) hours. Patient not taking: Reported on 04/14/2023 04/11/23   Bernadette Hoit, MD  naloxegol oxalate (MOVANTIK) 12.5 MG TABS tablet Take 1 tablet (12.5 mg total) by mouth daily. Patient not taking: Reported on 04/14/2023 04/13/23   Glee Arvin, NP     Vital Signs: BP (!) 139/90 (BP Location: Right Arm)   Pulse 83   Temp 98.3 F (36.8 C) (Oral)   Resp 18   Ht 5\' 2"  (1.575 m)   Wt 116 lb (52.6 kg)   LMP 06/26/2013 Comment: low dose BCP  SpO2 99%   BMI 21.22 kg/m   Physical Exam Abdominal:     Tenderness: There is no abdominal tenderness.     Comments: Bilateral PCNs present and draining, dressings intact  Skin:    General: Skin is warm and dry.  Neurological:     Mental Status: She is alert and oriented to person, place, and time.  Psychiatric:        Mood and Affect: Mood normal.        Behavior: Behavior normal.      Imaging: CT CELIAC PLEXUS BLOCK ANESTHETIC Result Date: 04/18/2023 INDICATION: Pancreatic cancer with intractable abdominal pain. EXAM: CT-GUIDED CELIAC PLEXUS NERVE BLOCK, DIAGNOSTIC AND THERAPEUTIC COMPARISON:  CT AP, 04/14/2023. MEDICATIONS: 80 mg Kenalog and 30 mL 0.5% bupivacaine. ANESTHESIA/SEDATION: Moderate (conscious) sedation was employed during this procedure. A total of Versed 3 mg and Fentanyl 150 mcg was administered intravenously. Moderate Sedation Time: 35 minutes. The patient's level of consciousness and vital signs were monitored continuously by radiology nursing throughout the procedure under my direct supervision. CONTRAST:  None. FLUOROSCOPY TIME:  CT dose; 296 mGycm COMPLICATIONS: None immediate. PROCEDURE: RADIATION DOSE REDUCTION: This exam was performed according to the departmental dose-optimization program which includes automated exposure control, adjustment of the mA  and/or kV according to patient size and/or use of iterative reconstruction technique. Informed consent was obtained from the patient following an explanation of the procedure, risks, benefits and alternatives. A time out was performed prior to the initiation of the procedure. The patient was positioned prone on the CT table and a limited CT was performed for procedural planning demonstrating an adequate window at the upper abdomen. The procedure was planned. A timeout was performed prior to the initiation of the procedure. The operative site was prepped and draped in the usual sterile fashion. Appropriate trajectory was confirmed with a 22 gauge spinal needle after the adjacent tissues were anesthetized with 1% Lidocaine with epinephrine. Under intermittent CT guidance, 15 cm 21-gauge Chiba needles were inserted via a posterior approach and the needle tips were positioned in the retroperitoneal space immediately anterolateral to the aorta, at the region of the celiac trunk. A small amount of dilute contrast was injected through the needles to confirm position. A solution containing 2 mL of 40 mg/mL Kenalog  and 15 mL of Bupivacaine was mixed and injected through each needle. Intermittent CT scanning confirmed appropriate spread into the extra vascular spaces. The needles were removed and hemostasis was achieved with manual compression. A limited postprocedural CT was negative for hemorrhage or additional complication. A dressing was placed. The patient tolerated the procedure well without immediate postprocedural complication. IMPRESSION: Successful CT-guided diagnostic and therapeutic celiac plexus nerve block, as above. PLAN: The patient will be re-evaluated in the postprocedural setting and again in 3-6 months to assess the efficacy of this nerve block. If successful, the patient may be a candidate for future celiac plexus nerve blocks versus neurolysis. Roanna Banning, MD Vascular and Interventional Radiology  [facility] Radiology Electronically Signed   By: Roanna Banning M.D.   On: 04/18/2023 16:43   IR NEPHROSTOMY PLACEMENT BILATERAL Result Date: 04/17/2023 CLINICAL DATA:  Metastatic pancreatic adenocarcinoma. New bilateral moderately severe hydronephrosis, acute abdominal pain EXAM: BILATERAL PERCUTANEOUS NEPHROSTOMY CATHETER PLACEMENT UNDER ULTRASOUND AND FLUOROSCOPIC GUIDANCE FLUOROSCOPY: Radiation Exposure Index (as provided by the fluoroscopic device): 3 mGy air Kerma TECHNIQUE: The procedure, risks (including but not limited to bleeding, infection, organ damage ), benefits, and alternatives were explained to the patient. Questions regarding the procedure were encouraged and answered. The patient understands and consents to the procedure. bilateralflank regions prepped with Betadine, draped in usual sterile fashion, infiltrated locally with 1% lidocaine. As antibiotic prophylaxis, Rocephin 2 g was ordered pre-procedure and administered intravenously within one hour of incision. Intravenous Fentanyl and Versed 2mg  were administered by RN during a total moderate (conscious) sedation time of 18 minutes; the patient's level of consciousness and physiological / cardiorespiratory status were monitored continuously by radiology RN under my direct supervision. Under real-time ultrasound guidance, a 21-gauge micropuncture needle was advanced into a right posterior lower pole calyx. Ultrasound image documentation was saved. Urine spontaneously returned through the needle. Needle was exchanged over a guidewire for transitional dilator. Contrast injection confirmed appropriate positioning. Catheter was exchanged over a guidewire for a 10 French pigtail catheter, formed centrally within the right renal collecting system. Contrast injection confirms appropriate positioning and patency. In similar fashion, Under real-time ultrasound guidance, a 21-gauge micropuncture needle was advanced into a left posterior  lower pole calyx. Ultrasound image documentation was saved. Urine spontaneously returned through the needle. Needle was exchanged over a guidewire for transitional dilator. Contrast injection confirmed appropriate positioning. Catheter was exchanged over a guidewire for a 10 French pigtail catheter, formed centrally within the left renal collecting system. Contrast injection confirms appropriate positioning and patency. Both catheters were secured externally with 0 Prolene sutures and placed to external drain bags. The patient tolerated the procedure well. COMPLICATIONS: COMPLICATIONS none IMPRESSION: 1. Technically successful bilateral percutaneous nephrostomy catheter placement. Electronically Signed   By: Corlis Leak M.D.   On: 04/17/2023 16:36    Labs:  CBC: Recent Labs    04/16/23 0321 04/17/23 0323 04/18/23 0312 04/19/23 0242  WBC 5.1 5.9 6.6 5.8  HGB 7.9* 7.9* 8.3* 8.9*  HCT 27.0* 27.1* 27.9* 29.2*  PLT 130* 104* 138* 143*    COAGS: Recent Labs    04/17/23 0323  INR 1.3*    BMP: Recent Labs    04/16/23 0321 04/17/23 0323 04/18/23 0312 04/19/23 0242  NA 139 138 136 136  K 3.8 3.6 3.5 4.4  CL 107 105 102 105  CO2 26 25 24 25   GLUCOSE 97 106* 111* 182*  BUN 9 8 9 10   CALCIUM 8.3* 8.3* 8.2* 8.4*  CREATININE  1.00 1.05* 0.87 0.85  GFRNONAA >60 >60 >60 >60    LIVER FUNCTION TESTS: Recent Labs    04/14/23 0812 04/15/23 0306 04/16/23 0321 04/17/23 0323  BILITOT 0.7 0.5 0.7 0.7  AST 18 14* 13* 14*  ALT 9 8 8 6   ALKPHOS 92 64 62 70  PROT 8.1 6.6 6.3* 6.1*  ALBUMIN 4.0 3.0* 2.9* 2.8*    Assessment and Plan:  Status post celiac plexus nerve block on 04/18/2023 for pancreatic mass with intractable abdominal pain - Patient reports no abdominal pain on 3/12, verbalizes interest in moving forward with celiac plexus neurolysis - Dr. Milford Cage spoke with Dr. Patterson Hammersmith, plan is for Dr. Patterson Hammersmith to discuss with patient and place order for image guided celiac plexus neurolysis.  IR  will schedule outpatient in approximately 1 month, recommended less than 3 months after block. - Risks and benefits of celiac plexus neurolysis discussed with patient and patient husband at bedside, patient aware to expect a call from IR office to schedule this outpatient - Bilateral PCNs present, dressing clean and intact, urine present in bilateral drainage bags, no concern from patient at this time -afebrile, WBC 5.8  Electronically Signed: Carlton Adam, NP 04/19/2023, 10:44 AM   I spent a total of 15 Minutes at the the patient's bedside AND on the patient's hospital floor or unit, greater than 50% of which was counseling/coordinating care for Status post celiac plexus nerve block on 04/18/2023 for pancreatic mass with intractable abdominal pain

## 2023-04-19 NOTE — Plan of Care (Signed)
   Problem: Education: Goal: Knowledge of General Education information will improve Description: Including pain rating scale, medication(s)/side effects and non-pharmacologic comfort measures Outcome: Progressing   Problem: Activity: Goal: Risk for activity intolerance will decrease Outcome: Progressing

## 2023-04-19 NOTE — Discharge Summary (Signed)
 Physician Discharge Summary  Kaitlin Ferrell NFA:[id number] DOB: [birth year] DOA: 04/14/2023  PCP: Bernadette Hoit, MD  Admit date: 04/14/2023 Discharge date: 04/19/2023  Admitted From: Home Disposition: Home  Recommendations for Outpatient Follow-up:  Follow up with PCP in 1-2 weeks Follow-up with medical oncology outpatient as scheduled Follow-up with interventional radiology for further management of percutaneous nephrostomy tubes; plan exchange in 6 weeks  Home Health: No Equipment/Devices: None  Discharge Condition: Stable CODE STATUS: Full code Diet recommendation: Regular diet  History of present illness:  Kaitlin Ferrell is a 58 y.o. female with past medical history significant for metastatic adenocarcinoma of the pancreas (Dx 03/22/2022, follows with Dr. Leonides Schanz, on FOLFIRINOX q2weeks), hypertension, GERD, portal vein occlusion and right upper extremity DVT (05/2022 on Eliquis) who presented to the emergency department complaints of progressively worsening abdominal pain over the last week despite utilizing laxative and enemas outpatient.   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 Dr. Cardell Peach with urology was consulted due to the bilateral hydronephrosis. The hospitalist group was then called to assess the patient for admission to the hospital.   Hospital course:  Intractable abdominal pain Patient presenting to ED with progressive severe bilateral flank, lower abdominal pain.  Imaging notable for progressive tumor burden in the pelvis with worsening  bilateral hydronephrosis.  Patient underwent bilateral nephrostomy tube placement by IR on 04/17/2023.  Patient underwent celiac plexus block by IR on 04/18/2023.  Continue home MS Contin and Dilaudid.  IR plans outpatient follow-up for celiac plexus neurolysis outpatient in 1-3 months.   Bilateral hydronephrosis CT abdomen/pelvis with interval development of moderate bilateral hydroureteronephrosis without evidence of obstructing calculus, likely due to external compression.  Seen by urology, Dr. Cardell Peach with recommendation of bilateral nephrostomy tube placement.  IR was consulted and patient went percutaneous bilateral nephrostomy tube placement on 04/17/2023.  Patient follow-up with interventional radiology for further management.   Metastatic pancreatic adenocarcinoma Patient follows with medical oncology outpatient, Dr. Leonides Schanz.  Imaging on admission shows substantial progression of metastatic pancreatic cancer with multiple developing complications including bilateral hydronephrosis, moderate/severe wall thickening of the rectum and distal sigmoid colon.  Overall prognosis is poor and not a candidate for future chemotherapy.  Outpatient follow-up with medical oncology, palliative care.   Constipation Large amount of stool throughout colon on imaging likely secondary to progressive tumor burden and ongoing opiates needs.  Continue Senokot and MiraLAX.   Hypokalemia Repleted, potassium 4.4 at time of discharge.   Normocytic anemia Anemia panel with iron 29, TIBC low at 158, ferritin 70, B12 2484, folate 8.9. Hemoglobin 8.9, stable.   Thrombocytopenia Platelet count stable, 143 time of discharge.   GERD Protonix 40 mg p.o. daily  Discharge Diagnoses:  Principal Problem:   Intractable abdominal pain Active Problems:   Pancreatic adenocarcinoma (HCC)   Gastroesophageal reflux disease without esophagitis   Hypokalemia   Constipation   Bilateral hydronephrosis   Proctitis   Normocytic  anemia   Thrombocytopenia (HCC)   Palliative care encounter   Metastatic malignant neoplasm (HCC)   Counseling and coordination of care   Hydronephrosis   Cancer associated pain  Discharge Instructions  Discharge Instructions     Call MD for:  difficulty breathing, headache or visual disturbances   Complete by: As directed    Call MD for:  extreme fatigue   Complete by: As directed    Call MD for:  persistant dizziness or light-headedness   Complete by: As directed    Call MD for:  persistant nausea and vomiting   Complete by: As directed    Call MD for:  severe uncontrolled pain   Complete by: As directed    Call MD for:  temperature >100.4   Complete by: As directed    Diet - low sodium heart healthy   Complete by: As directed    Increase activity slowly   Complete by: As directed    No wound care   Complete by: As directed       Allergies as of 04/19/2023       Reactions   Compazine [prochlorperazine Edisylate]    Very agitated   Propofol Other (See Comments)   Headache   Oxycodone-acetaminophen Itching, Other (See Comments)   Welts, too   Prochlorperazine    Labetalol Hives, Itching, Swelling, Rash        Medication List     STOP taking these medications    citalopram 10 MG tablet Commonly known as: CELEXA   naloxegol oxalate 12.5 MG Tabs tablet Commonly known as: MOVANTIK       TAKE these medications    eletriptan 40 MG tablet Commonly known as: Relpax Take 1 tablet (40 mg total) by mouth as needed for migraine or headache. May repeat in 2 hours if needed. What changed:  reasons to take this additional instructions   HYDROmorphone 2 MG tablet Commonly known as: Dilaudid Take 1 tablet (2 mg total) by mouth every 4 (four) hours as needed for severe pain (pain score 7-10).   lidocaine-prilocaine cream Commonly known as: EMLA Apply 1 Application topically as needed. What changed: reasons to take this   LORazepam 0.5 MG  tablet Commonly known as: ATIVAN Take 0.5 mg by mouth daily as needed for anxiety.   LORazepam 1 MG tablet Commonly known as: Ativan Take 1 tablet (1 mg total) by mouth every 8 (eight) hours.   magnesium oxide 400 (241.3 Mg) MG tablet Commonly known as: MAG-OX TAKE 1 TABLET BY MOUTH TWICE A DAY   morphine 30 MG 12 hr tablet Commonly known as: MS CONTIN Take 1 tablet (30 mg total) by mouth every 8 (eight) hours. What changed:  when to take this additional instructions   morphine 15 MG 12 hr tablet Commonly known as: MS CONTIN Take 1 tablet (15 mg total) by mouth every 8 (eight) hours. What changed:  how much to take when to take this additional instructions   OLANZapine 10 MG tablet Commonly known as: ZYPREXA TAKE 1 TABLET BY MOUTH EVERYDAY AT BEDTIME What changed: See the new instructions.   ondansetron 8 MG disintegrating tablet Commonly known as: ZOFRAN-ODT Take 1 tablet (8 mg total) by mouth every 8 (eight) hours as needed for nausea or vomiting. What changed: reasons to take this   ondansetron 8 MG tablet Commonly known as: ZOFRAN Take 1 tablet (8 mg total) by mouth every 8 (eight) hours as needed for nausea or vomiting.   pantoprazole 40 MG tablet Commonly known as: PROTONIX Take 40 mg by mouth daily as needed (for GERD-like symptoms).   polyethylene glycol 17 g packet Commonly known as: MIRALAX / GLYCOLAX Take  17 g by mouth 3 (three) times daily.   promethazine 25 MG tablet Commonly known as: PHENERGAN Take 1 tablet (25 mg total) by mouth every 6 (six) hours as needed for nausea or vomiting.   scopolamine 1 MG/3DAYS Commonly known as: TRANSDERM-SCOP Place 1 patch (1.5 mg total) onto the skin every 3 (three) days.   senna-docusate 8.6-50 MG tablet Commonly known as: Senokot-S Take 2 tablets by mouth 3 (three) times daily.   sodium chloride flush 0.9 % Soln injection Place 5 mLs into feeding tube as needed. Flush each nephrostomy tube with 5 mm as  needed if noted decreased output.   TYLENOL 500 MG tablet Generic drug: acetaminophen Take 1,000 mg by mouth every 6 (six) hours as needed for mild pain (pain score 1-3) or headache.   Yuvafem 10 MCG Tabs vaginal tablet Generic drug: Estradiol Place 10 mcg vaginally 2 (two) times a week.         Follow-up Information     Bernadette Hoit, MD. Schedule an appointment as soon as possible for a visit.   Specialty: Family Medicine Contact information: [address] [place] [address] [phone]                Allergies  Allergen Reactions   Compazine [Prochlorperazine Edisylate]     Very agitated   Propofol Other (See Comments)    Headache   Oxycodone-Acetaminophen Itching and Other (See Comments)    Welts, too   Prochlorperazine    Labetalol Hives, Itching, Swelling and Rash    Consultations: Medical oncology Palliative care Urology, Dr. Cardell Peach Interventional radiology   Procedures/Studies: CT CELIAC PLEXUS BLOCK ANESTHETIC Result Date: 04/18/2023 INDICATION: Pancreatic cancer with intractable abdominal pain. EXAM: CT-GUIDED CELIAC PLEXUS NERVE BLOCK, DIAGNOSTIC AND THERAPEUTIC COMPARISON:  CT AP, 04/14/2023. MEDICATIONS: 80 mg Kenalog and 30 mL 0.5% bupivacaine. ANESTHESIA/SEDATION: Moderate (conscious) sedation was employed during this procedure. A total of Versed 3 mg and Fentanyl 150 mcg was administered intravenously. Moderate Sedation Time: 35 minutes. The patient's level of consciousness and vital signs were monitored continuously by radiology nursing throughout the procedure under my direct supervision. CONTRAST:  None. FLUOROSCOPY TIME:  CT dose; 296 mGycm COMPLICATIONS: None immediate. PROCEDURE: RADIATION DOSE REDUCTION: This exam was performed according to the departmental dose-optimization program which includes automated exposure control, adjustment of the mA and/or kV according to patient size and/or use of iterative reconstruction technique.  Informed consent was obtained from the patient following an explanation of the procedure, risks, benefits and alternatives. A time out was performed prior to the initiation of the procedure. The patient was positioned prone on the CT table and a limited CT was performed for procedural planning demonstrating an adequate window at the upper abdomen. The procedure was planned. A timeout was performed prior to the initiation of the procedure. The operative site was prepped and draped in the usual sterile fashion. Appropriate trajectory was confirmed with a 22 gauge spinal needle after the adjacent tissues were anesthetized with 1% Lidocaine with epinephrine. Under intermittent CT guidance, 15 cm 21-gauge Chiba needles were inserted via a posterior approach and the needle tips were positioned in the retroperitoneal space immediately anterolateral to the aorta, at the region of the celiac trunk. A small amount of dilute contrast was injected through the needles to confirm position. A solution containing 2 mL of 40 mg/mL Kenalog and 15 mL of Bupivacaine was mixed and injected through each needle. Intermittent CT scanning confirmed appropriate spread into the extra  vascular spaces. The needles were removed and hemostasis was achieved with manual compression. A limited postprocedural CT was negative for hemorrhage or additional complication. A dressing was placed. The patient tolerated the procedure well without immediate postprocedural complication. IMPRESSION: Successful CT-guided diagnostic and therapeutic celiac plexus nerve block, as above. PLAN: The patient will be re-evaluated in the postprocedural setting and again in 3-6 months to assess the efficacy of this nerve block. If successful, the patient may be a candidate for future celiac plexus nerve blocks versus neurolysis. Roanna Banning, MD [facility] Electronically Signed   By: Roanna Banning M.D.   On:  04/18/2023 16:43   IR NEPHROSTOMY PLACEMENT BILATERAL Result Date: 04/17/2023 CLINICAL DATA:  Metastatic pancreatic adenocarcinoma. New bilateral moderately severe hydronephrosis, acute abdominal pain EXAM: BILATERAL PERCUTANEOUS NEPHROSTOMY CATHETER PLACEMENT UNDER ULTRASOUND AND FLUOROSCOPIC GUIDANCE FLUOROSCOPY: Radiation Exposure Index (as provided by the fluoroscopic device): 3 mGy air Kerma TECHNIQUE: The procedure, risks (including but not limited to bleeding, infection, organ damage ), benefits, and alternatives were explained to the patient. Questions regarding the procedure were encouraged and answered. The patient understands and consents to the procedure. bilateralflank regions prepped with Betadine, draped in usual sterile fashion, infiltrated locally with 1% lidocaine. As antibiotic prophylaxis, Rocephin 2 g was ordered pre-procedure and administered intravenously within one hour of incision. Intravenous Fentanyl and Versed 2mg  were administered by RN during a total moderate (conscious) sedation time of 18 minutes; the patient's level of consciousness and physiological / cardiorespiratory status were monitored continuously by radiology RN under my direct supervision. Under real-time ultrasound guidance, a 21-gauge micropuncture needle was advanced into a right posterior lower pole calyx. Ultrasound image documentation was saved. Urine spontaneously returned through the needle. Needle was exchanged over a guidewire for transitional dilator. Contrast injection confirmed appropriate positioning. Catheter was exchanged over a guidewire for a 10 French pigtail catheter, formed centrally within the right renal collecting system. Contrast injection confirms appropriate positioning and patency. In similar fashion, Under real-time ultrasound guidance, a 21-gauge micropuncture needle was advanced into a left posterior lower pole calyx. Ultrasound image documentation was saved. Urine spontaneously  returned through the needle. Needle was exchanged over a guidewire for transitional dilator. Contrast injection confirmed appropriate positioning. Catheter was exchanged over a guidewire for a 10 French pigtail catheter, formed centrally within the left renal collecting system. Contrast injection confirms appropriate positioning and patency. Both catheters were secured externally with 0 Prolene sutures and placed to external drain bags. The patient tolerated the procedure well. COMPLICATIONS: COMPLICATIONS none IMPRESSION: 1. Technically successful bilateral percutaneous nephrostomy catheter placement. Electronically Signed   By: Corlis Leak M.D.   On: 04/17/2023 16:36   CT ABDOMEN PELVIS W CONTRAST Result Date: 04/14/2023 CLINICAL DATA:  Acute generalized abdominal pain. History of pancreatic cancer. EXAM: CT ABDOMEN AND PELVIS WITH CONTRAST TECHNIQUE: Multidetector CT imaging of the abdomen and pelvis was performed using the standard protocol following bolus administration of intravenous contrast. RADIATION DOSE REDUCTION: This exam was performed according to the departmental dose-optimization program which includes automated exposure control, adjustment of the mA and/or kV according to patient size and/or use of iterative reconstruction technique. CONTRAST:  OMNIPAQUE IOHEXOL 300 MG/ML  SOLN COMPARISON:  March 20, 2023. FINDINGS: Lower chest: No acute abnormality. Hepatobiliary: No cholelithiasis or biliary dilatation is noted. Multiple hepatic lesions are again noted consistent with metastatic disease. The largest measures 2.8 x 2.2 cm in posterior segment of right hepatic lobe which is enlarged compared  to prior exam. 2.6 x 2.6 cm lesion is noted in superior portion of right hepatic lobe which is enlarged compared to prior exam as well. Pancreas: 3.5 x 2.3 cm pancreatic head mass is noted consistent with malignancy. Mild dilatation of common bile duct is noted. This mass appears to encase and occludes  the superior mesenteric vein near its insertion with the portal vein. It also seems to surround the proximal portion of the superior mesenteric artery, but it remains patent. Spleen: Normal in size without focal abnormality. Adrenals/Urinary Tract: Adrenal glands are unremarkable. Decreased enhancement of left kidney is noted. Moderate bilateral hydroureteronephrosis is noted without evidence of obstructing calculus, concerning for distal ureteral occlusion. Urinary bladder is only minimally distended. Stomach/Bowel: The stomach is unremarkable. There is no evidence of small bowel dilatation. Large amount of stool and fluid is seen throughout the colon. The appendix appears normal. There appears to be moderate to severe wall thickening of the rectum and possibly distal sigmoid colon suggesting inflammation or possibly malignancy. There are noted probable enhancing soft tissue lesions in this area suggesting peritoneal carcinomatosis. Vascular/Lymphatic: Abdominal aorta is unremarkable. 1.4 cm right external iliac lymph node is noted concerning for metastatic disease. 8 mm left periaortic lymph node is noted. 9 mm right periaortic lymph node is noted as well. Reproductive: Status post hysterectomy. No adnexal masses. Other: No ascites or hernia is noted. Musculoskeletal: No acute or significant osseous findings. IMPRESSION: There is interval development of moderate bilateral hydroureteronephrosis without evidence of obstructing calculus. This most likely is due to obstruction of distal ureters due to external compression, most likely due to possible peritoneal implants or other metastatic disease in the pelvis. There is noted severe wall thickening of the rectum and probable distal sigmoid colon due to either inflammation or metastatic disease. Hepatic metastatic lesions are again noted which appear to be increased in size compared to prior exam. 3.5 x 2.3 cm pancreatic head mass is noted consistent with malignancy.  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 This mass also appears to encase the superior mesenteric artery, but the artery remains patent. Retroperitoneal and right external iliac adenopathy is noted consistent with metastatic disease. Electronically Signed   By: Lupita Raider M.D.   On: 04/14/2023 12:41   DG Abdomen 1 View Result Date: 04/14/2023 CLINICAL DATA:  Abdominal pain and constipation EXAM: ABDOMEN - 1 VIEW COMPARISON:  CT abdomen and pelvis dated 03/20/2023 FINDINGS: Nonobstructive bowel gas pattern. No pneumatosis. Large volume stool throughout the colon. No abnormal radio-opaque calculi or mass effect. No acute or substantial osseous abnormality. The sacrum and coccyx are partially obscured by overlying bowel contents. IMPRESSION: Large volume stool throughout the colon. Electronically Signed   By: Agustin Cree M.D.   On: 04/14/2023 10:01     Subjective: Patient seen examined bedside, lying in bed.  Pain resolved after celiac plexus block that was done yesterday.  Updated patient's husband on speaker phone in the room.  No other specific questions, concerns or complaints at this time.  Denies headache, no fever/chills/night events, no nausea/vomiting/diarrhea, no chest pain, no palpitations, no shortness of breath, no focal weakness, no fatigue, no paresthesias.  No acute events overnight per nursing staff.  Discharging home.  Discharge Exam: Vitals:   04/19/23 0511 04/19/23 1324  BP: (!) 139/90 (!) 164/96  Pulse: 83 82  Resp: 18 18  Temp: 98.3 F (36.8 C) 98.2 F (36.8 C)  SpO2:  99% 100%   Vitals:   04/18/23 1555 04/18/23 2118 04/19/23 0511 04/19/23 1324  BP: (!) 154/94 (!) 160/99 (!) 139/90 (!) 164/96  Pulse: 92 92 83 82  Resp: 14 18 18 18   Temp: 98 F (36.7 C) 98.5 F (36.9 C) 98.3 F (36.8 C) 98.2 F (36.8 C)  TempSrc: Oral Oral Oral Oral  SpO2: 100% 97% 99% 100%  Weight:      Height:         Physical Exam: GEN: NAD, alert and oriented x 3, wd/wn HEENT: NCAT, PERRL, EOMI, sclera clear, MMM PULM: CTAB w/o wheezes/crackles, normal respiratory effort, on room air CV: RRR w/o M/G/R GI: abd soft, slight TTP bilateral flank, nondistended, +BS; bilateral nephrostomy tubes noted draining light clear yellow urine in collection bags MSK: no peripheral edema, muscle strength globally intact 5/5 bilateral upper/lower extremities NEURO: No focal neurological deficits PSYCH: normal mood/affect Integumentary: d no concerning rashes/lesions/wounds noted on exposed skin surfaces    The results of significant diagnostics from this hospitalization (including imaging, microbiology, ancillary and laboratory) are listed below for reference.     Microbiology: No results found for this or any previous visit (from the past 240 hours).   Labs: BNP (last 3 results) No results for input(s): "BNP" in the last 8760 hours. Basic Metabolic Panel: Recent Labs  Lab 04/14/23 0812 04/15/23 0306 04/16/23 0321 04/17/23 0323 04/18/23 0312 04/19/23 0242  NA 136 138 139 138 136 136  K 3.1* 3.7 3.8 3.6 3.5 4.4  CL 100 105 107 105 102 105  CO2 25 26 26 25 24 25   GLUCOSE 105* 123* 97 106* 111* 182*  BUN 11 11 9 8 9 10   CREATININE 1.10* 0.91 1.00 1.05* 0.87 0.85  CALCIUM 9.1 8.0* 8.3* 8.3* 8.2* 8.4*  MG 2.5*  --  2.4  --  2.1  --   PHOS 3.3  --   --   --   --   --    Liver Function Tests: Recent Labs  Lab 04/14/23 0812 04/15/23 0306 04/16/23 0321 04/17/23 0323  AST 18 14* 13* 14*  ALT 9 8 8 6   ALKPHOS 92 64 62 70  BILITOT 0.7 0.5 0.7 0.7  PROT 8.1 6.6 6.3* 6.1*  ALBUMIN 4.0 3.0* 2.9* 2.8*   Recent Labs  Lab 04/14/23 0812  LIPASE 21   No results for input(s): "AMMONIA" in the last 168 hours. CBC: Recent Labs  Lab 04/15/23 0306 04/16/23 0321 04/17/23 0323 04/18/23 0312 04/19/23 0242  WBC 5.8 5.1 5.9 6.6 5.8  NEUTROABS  --  3.1 3.7  --   --   HGB 8.0* 7.9* 7.9* 8.3* 8.9*   HCT 27.1* 27.0* 27.1* 27.9* 29.2*  MCV 93.4 94.4 94.4 93.0 90.7  PLT 120* 130* 104* 138* 143*   Cardiac Enzymes: No results for input(s): "CKTOTAL", "CKMB", "CKMBINDEX", "TROPONINI" in the last 168 hours. BNP: Invalid input(s): "POCBNP" CBG: No results for input(s): "GLUCAP" in the last 168 hours. D-Dimer No results for input(s): "DDIMER" in the last 72 hours. Hgb A1c No results for input(s): "HGBA1C" in the last 72 hours. Lipid Profile No results for input(s): "CHOL", "HDL", "LDLCALC", "TRIG", "CHOLHDL", "LDLDIRECT" in the last 72 hours. Thyroid function studies No results for input(s): "TSH", "T4TOTAL", "T3FREE", "THYROIDAB" in the last 72 hours.  Invalid input(s): "FREET3" Anemia work up No results for input(s): "VITAMINB12", "FOLATE", "FERRITIN", "TIBC", "IRON", "RETICCTPCT" in the last 72 hours. Urinalysis    Component Value Date/Time   COLORURINE YELLOW 04/19/2023  1231   APPEARANCEUR CLEAR 04/19/2023 1231   LABSPEC 1.021 04/19/2023 1231   PHURINE 6.0 04/19/2023 1231   GLUCOSEU NEGATIVE 04/19/2023 1231   HGBUR MODERATE (A) 04/19/2023 1231   BILIRUBINUR NEGATIVE 04/19/2023 1231   KETONESUR NEGATIVE 04/19/2023 1231   PROTEINUR 30 (A) 04/19/2023 1231   NITRITE NEGATIVE 04/19/2023 1231   LEUKOCYTESUR SMALL (A) 04/19/2023 1231   Sepsis Labs Recent Labs  Lab 04/16/23 0321 04/17/23 0323 04/18/23 0312 04/19/23 0242  WBC 5.1 5.9 6.6 5.8   Microbiology No results found for this or any previous visit (from the past 240 hours).   Time coordinating discharge: Over 30 minutes  SIGNED:   Alvira Philips [place], DO  [facility] 04/19/2023, 1:55 PM

## 2023-04-19 NOTE — Discharge Instructions (Addendum)
 Discharge instructions related to bilateral nephrostomy drains: If the PCN is going to be present long term, the patient will need routine exchanges about every 6-10 weeks. IR will order exchange and scheduler will contact patient with appointment details.   Change the dressing of PCNs every 2 to 3 days.  You do not need to flush your PCNs daily, they are draining well.  If you find the output decreases significantly you may flush slowly with 5 cc normal saline.  Use clean technique when flushing, call office with concerns for infection such as redness at insertion site, fever, increased pain at insertion site.  You may cover insertion site to shower, do not submerge drains. You received a celiac plexus nerve block during your hospitalization.  Your team will need to place order for IR to provide celiac plexus neurolysis.

## 2023-04-19 NOTE — Progress Notes (Signed)
   04/19/23 1411  Spiritual Encounters  Type of Visit Initial  Care provided to: Patient  Reason for visit Routine spiritual support  OnCall Visit No   Visited with patient who was in good spirits as she is being discharged today. Patient was very energetic and positive. Her spouse and daughter are on their way to pick her up. Talked briefly and provided spiritual care in the form of prayer.

## 2023-04-19 NOTE — Progress Notes (Signed)
 RN taught patient how to flush urostomy bags and do dressing changes. Supplies provided. Discharge instructions given to patient and all questions were answered.

## 2023-04-19 NOTE — Progress Notes (Signed)
 PROGRESS NOTE    Kaitlin Ferrell  ZOX:[id number] DOB: November 21, 1965 DOA: 04/14/2023 PCP: Bernadette Hoit, MD    Brief Narrative:   Kaitlin Ferrell is a 58 y.o. female with past medical history significant for metastatic adenocarcinoma of the pancreas (Dx 03/22/2022, follows with Dr. Leonides Schanz, on FOLFIRINOX q2weeks), hypertension, GERD, portal vein occlusion and right upper extremity DVT (05/2022 on Eliquis) who presented to the emergency department complaints of progressively worsening abdominal pain over the last week despite utilizing laxative and enemas outpatient.  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 Dr. Cardell Peach with urology was consulted due to the bilateral hydronephrosis. The hospitalist group was then called to assess the patient for admission to the hospital.   Assessment & Plan:   Intractable abdominal pain Patient presenting to ED with progressive severe bilateral flank, lower abdominal pain.  Imaging notable for progressive tumor burden in the pelvis with worsening bilateral hydronephrosis.  Patient underwent bilateral nephrostomy tube placement by IR on 04/17/2023.  Patient underwent celiac plexus block by IR on 04/18/2023. -- Palliative care, medical oncology following; appreciate assistance -- MS Contin 60 mg PO q8h -- Dilaudid 2-4 mg PO q4h PRN moderate/severe pain -- Dilaudid 1 - 2 mg IV q3h PRN moderate/severe breakthrough pain -- Possible plan for celiac plexus neurolysis outpatient in 1-3 months  Bilateral hydronephrosis CT abdomen/pelvis with  interval development of moderate bilateral hydroureteronephrosis without evidence of obstructing calculus, likely due to external compression.  Seen by urology, Dr. Cardell Peach with recommendation of bilateral nephrostomy tube placement.  IR was consulted and patient went percutaneous bilateral nephrostomy tube placement on 04/17/2023. -- Continue to monitor urine output  Metastatic pancreatic adenocarcinoma Patient follows with medical oncology outpatient, Dr. Leonides Schanz.  Imaging on admission shows substantial progression of metastatic pancreatic cancer with multiple developing complications including bilateral hydronephrosis, moderate/severe wall thickening of the rectum and distal sigmoid colon.  Overall prognosis is poor and not a candidate for future chemotherapy. -- Medical oncology, palliative care following, appreciate assistance  Constipation Large amount of stool throughout colon on imaging likely secondary to progressive tumor burden and ongoing opiates needs. -- Discontinued lactulose as she is refusing -- Senokot-S 2 tabs twice daily -- MiraLAX twice daily  Hypokalemia Repleted, potassium 4.4 today.  Normocytic anemia Anemia panel with iron 29, TIBC low at 158, ferritin 70, B12 2000 484, folate 8.9. -- Hemoglobin 8.9, stable. -- Transfuse for hemoglobin less than 7.0 -- CBC in am  Thrombocytopenia -- Plt 153>120>130>104>138 -- CBC daily  GERD -- Protonix 40 mg p.o. daily   DVT prophylaxis: SCDs Start: 04/14/23 1404    Code Status: Full Code Family Communication: No family present at bedside this morning, updated patient's husband on speaker phone in room  Disposition Plan:  Level of care: Med-Surg Status is: Inpatient Remains inpatient appropriate because: Await further recommendations from palliative care    Consultants:  Medical oncology Palliative care Urology, Dr. Cardell Peach Interventional radiology  Procedures:  Bilateral percutaneous nephrostomy tube, IR 3/10 Celiac  plexus block: 3/11  Antimicrobials:  Ceftriaxone 3/7 - 3/11 Metronidazole 3/7 - 3/12  Subjective: Patient seen examined bedside, lying in bed.  Pain resolved after celiac plexus block that was done yesterday.  Updated patient's husband on speaker phone in the room.  No other specific questions, concerns or complaints at this time.  Denies headache, no fever/chills/night events, no nausea/vomiting/diarrhea, no chest pain, no palpitations, no shortness of breath, no focal weakness, no fatigue, no paresthesias.  No acute events overnight per nursing staff.  Await further recommendations from palliative care.  Objective: Vitals:   04/18/23 1530 04/18/23 1555 04/18/23 2118 04/19/23 0511  BP: (!) 131/91 (!) 154/94 (!) 160/99 (!) 139/90  Pulse: 89 92 92 83  Resp: 14 14 18 18   Temp:  98 F (36.7 C) 98.5 F (36.9 C) 98.3 F (36.8 C)  TempSrc:  Oral Oral Oral  SpO2: 100% 100% 97% 99%  Weight:      Height:        Intake/Output Summary (Last 24 hours) at 04/19/2023 1207 Last data filed at 04/19/2023 1000 Gross per 24 hour  Intake 1430.07 ml  Output 2200 ml  Net -769.93 ml   Filed Weights   04/14/23 0630  Weight: 52.6 kg    Examination:  Physical Exam: GEN: NAD, alert and oriented x 3, wd/wn HEENT: NCAT, PERRL, EOMI, sclera clear, MMM PULM: CTAB w/o wheezes/crackles, normal respiratory effort, on room air CV: RRR w/o M/G/R GI: abd soft, slight TTP bilateral flank, nondistended, +BS; bilateral nephrostomy tubes noted draining light clear yellow urine in collection bags MSK: no peripheral edema, muscle strength globally intact 5/5 bilateral upper/lower extremities NEURO: No focal neurological deficits PSYCH: normal mood/affect Integumentary: d no concerning rashes/lesions/wounds noted on exposed skin surfaces    Data Reviewed: I have personally reviewed following labs and imaging studies  CBC: Recent Labs  Lab 04/15/23 0306 04/16/23 0321 04/17/23 0323 04/18/23 0312  04/19/23 0242  WBC 5.8 5.1 5.9 6.6 5.8  NEUTROABS  --  3.1 3.7  --   --   HGB 8.0* 7.9* 7.9* 8.3* 8.9*  HCT 27.1* 27.0* 27.1* 27.9* 29.2*  MCV 93.4 94.4 94.4 93.0 90.7  PLT 120* 130* 104* 138* 143*   Basic Metabolic Panel: Recent Labs  Lab 04/14/23 0812 04/15/23 0306 04/16/23 0321 04/17/23 0323 04/18/23 0312 04/19/23 0242  NA 136 138 139 138 136 136  K 3.1* 3.7 3.8 3.6 3.5 4.4  CL 100 105 107 105 102 105  CO2 25 26 26 25 24 25   GLUCOSE 105* 123* 97 106* 111* 182*  BUN 11 11 9 8 9 10   CREATININE 1.10* 0.91 1.00 1.05* 0.87 0.85  CALCIUM 9.1 8.0* 8.3* 8.3* 8.2* 8.4*  MG 2.5*  --  2.4  --  2.1  --   PHOS 3.3  --   --   --   --   --    GFR: Estimated Creatinine Clearance: 58.5 mL/min (by C-G formula based on SCr of 0.85 mg/dL). Liver Function Tests: Recent Labs  Lab 04/14/23 0812 04/15/23 0306 04/16/23 0321 04/17/23 0323  AST 18 14* 13* 14*  ALT 9 8 8 6   ALKPHOS 92 64 62 70  BILITOT 0.7 0.5 0.7 0.7  PROT 8.1 6.6 6.3* 6.1*  ALBUMIN 4.0 3.0* 2.9* 2.8*   Recent Labs  Lab 04/14/23 0812  LIPASE 21   No results for input(s): "AMMONIA" in the last 168 hours. Coagulation Profile: Recent Labs  Lab 04/17/23 0323  INR 1.3*   Cardiac Enzymes: No results for input(s): "CKTOTAL", "CKMB", "CKMBINDEX", "TROPONINI" in the last 168 hours. BNP (last 3 results) No results for input(s): "PROBNP"  in the last 8760 hours. HbA1C: No results for input(s): "HGBA1C" in the last 72 hours. CBG: No results for input(s): "GLUCAP" in the last 168 hours. Lipid Profile: No results for input(s): "CHOL", "HDL", "LDLCALC", "TRIG", "CHOLHDL", "LDLDIRECT" in the last 72 hours. Thyroid Function Tests: No results for input(s): "TSH", "T4TOTAL", "FREET4", "T3FREE", "THYROIDAB" in the last 72 hours. Anemia Panel: No results for input(s): "VITAMINB12", "FOLATE", "FERRITIN", "TIBC", "IRON", "RETICCTPCT" in the last 72 hours.  Sepsis Labs: No results for input(s): "PROCALCITON", "LATICACIDVEN"  in the last 168 hours.  No results found for this or any previous visit (from the past 240 hours).       Radiology Studies: CT CELIAC PLEXUS BLOCK ANESTHETIC Result Date: 04/18/2023 INDICATION: Pancreatic cancer with intractable abdominal pain. EXAM: CT-GUIDED CELIAC PLEXUS NERVE BLOCK, DIAGNOSTIC AND THERAPEUTIC COMPARISON:  CT AP, 04/14/2023. MEDICATIONS: 80 mg Kenalog and 30 mL 0.5% bupivacaine. ANESTHESIA/SEDATION: Moderate (conscious) sedation was employed during this procedure. A total of Versed 3 mg and Fentanyl 150 mcg was administered intravenously. Moderate Sedation Time: 35 minutes. The patient's level of consciousness and vital signs were monitored continuously by radiology nursing throughout the procedure under my direct supervision. CONTRAST:  None. FLUOROSCOPY TIME:  CT dose; 296 mGycm COMPLICATIONS: None immediate. PROCEDURE: RADIATION DOSE REDUCTION: This exam was performed according to the departmental dose-optimization program which includes automated exposure control, adjustment of the mA and/or kV according to patient size and/or use of iterative reconstruction technique. Informed consent was obtained from the patient following an explanation of the procedure, risks, benefits and alternatives. A time out was performed prior to the initiation of the procedure. The patient was positioned prone on the CT table and a limited CT was performed for procedural planning demonstrating an adequate window at the upper abdomen. The procedure was planned. A timeout was performed prior to the initiation of the procedure. The operative site was prepped and draped in the usual sterile fashion. Appropriate trajectory was confirmed with a 22 gauge spinal needle after the adjacent tissues were anesthetized with 1% Lidocaine with epinephrine. Under intermittent CT guidance, 15 cm 21-gauge Chiba needles were inserted via a posterior approach and the needle tips were positioned in the retroperitoneal space  immediately anterolateral to the aorta, at the region of the celiac trunk. A small amount of dilute contrast was injected through the needles to confirm position. A solution containing 2 mL of 40 mg/mL Kenalog and 15 mL of Bupivacaine was mixed and injected through each needle. Intermittent CT scanning confirmed appropriate spread into the extra vascular spaces. The needles were removed and hemostasis was achieved with manual compression. A limited postprocedural CT was negative for hemorrhage or additional complication. A dressing was placed. The patient tolerated the procedure well without immediate postprocedural complication. IMPRESSION: Successful CT-guided diagnostic and therapeutic celiac plexus nerve block, as above. PLAN: The patient will be re-evaluated in the postprocedural setting and again in 3-6 months to assess the efficacy of this nerve block. If successful, the patient may be a candidate for future celiac plexus nerve blocks versus neurolysis. Roanna Banning, MD [facility] Electronically Signed   By: Roanna Banning M.D.   On: 04/18/2023 16:43   IR NEPHROSTOMY PLACEMENT BILATERAL Result Date: 04/17/2023 CLINICAL DATA:  Metastatic pancreatic adenocarcinoma. New bilateral moderately severe hydronephrosis, acute abdominal pain EXAM: BILATERAL PERCUTANEOUS NEPHROSTOMY CATHETER PLACEMENT UNDER ULTRASOUND AND FLUOROSCOPIC GUIDANCE FLUOROSCOPY: Radiation Exposure Index (as provided by the fluoroscopic device): 3 mGy air Kerma TECHNIQUE: The procedure, risks (  including but not limited to bleeding, infection, organ damage ), benefits, and alternatives were explained to the patient. Questions regarding the procedure were encouraged and answered. The patient understands and consents to the procedure. bilateralflank regions prepped with Betadine, draped in usual sterile fashion, infiltrated locally with 1% lidocaine. As antibiotic prophylaxis, Rocephin 2  g was ordered pre-procedure and administered intravenously within one hour of incision. Intravenous Fentanyl and Versed 2mg  were administered by RN during a total moderate (conscious) sedation time of 18 minutes; the patient's level of consciousness and physiological / cardiorespiratory status were monitored continuously by radiology RN under my direct supervision. Under real-time ultrasound guidance, a 21-gauge micropuncture needle was advanced into a right posterior lower pole calyx. Ultrasound image documentation was saved. Urine spontaneously returned through the needle. Needle was exchanged over a guidewire for transitional dilator. Contrast injection confirmed appropriate positioning. Catheter was exchanged over a guidewire for a 10 French pigtail catheter, formed centrally within the right renal collecting system. Contrast injection confirms appropriate positioning and patency. In similar fashion, Under real-time ultrasound guidance, a 21-gauge micropuncture needle was advanced into a left posterior lower pole calyx. Ultrasound image documentation was saved. Urine spontaneously returned through the needle. Needle was exchanged over a guidewire for transitional dilator. Contrast injection confirmed appropriate positioning. Catheter was exchanged over a guidewire for a 10 French pigtail catheter, formed centrally within the left renal collecting system. Contrast injection confirms appropriate positioning and patency. Both catheters were secured externally with 0 Prolene sutures and placed to external drain bags. The patient tolerated the procedure well. COMPLICATIONS: COMPLICATIONS none IMPRESSION: 1. Technically successful bilateral percutaneous nephrostomy catheter placement. Electronically Signed   By: Corlis Leak M.D.   On: 04/17/2023 16:36        Scheduled Meds:  Chlorhexidine Gluconate Cloth  6 each Topical Daily   morphine  60 mg Oral Q8H   pantoprazole  40 mg Oral Daily   polyethylene  glycol  17 g Oral TID   senna-docusate  2 tablet Oral TID   sodium chloride flush  10-40 mL Intracatheter Q12H   sodium chloride flush  5 mL Intracatheter Q8H   Continuous Infusions:  cefTRIAXone (ROCEPHIN)  IV Stopped (04/18/23 1609)     LOS: 5 days    Time spent: 49 minutes spent on 04/19/2023 caring for this patient face-to-face including chart review, ordering labs/tests, documenting, discussion with nursing staff, consultants, updating family and interview/physical exam    Alvira Philips [place], DO [facility] Available via Epic secure chat 7am-7pm After these hours, please refer to coverage provider listed on [email or web address] 04/19/2023, 12:07 PM

## 2023-04-19 NOTE — Progress Notes (Signed)
 Daily Progress Note   Patient Name: Kaitlin Ferrell       Date: 04/19/2023 DOB: 1965/11/28  Age: 58 y.o. MRN#: [id number] Attending Physician: [place], Eric J, DO Primary Care Physician: Bernadette Hoit, MD Admit Date: 04/14/2023 Length of Stay: 5 days  Reason for Consultation/Follow-up: Establishing goals of care, Non pain symptom management, and Pain control  Subjective:   CC: Patient noting that her pain is "zero".  Palliative medicine team following up regarding complex medical decision making and symptom management.  Subjective:  Reviewed EMR prior to presenting to bedside.  At time of EMR review in past 24 hours patient has received as needed IV Dilaudid 2 mg x 3 doses and no oral as needed Dilaudid.  Patient continues to receive MS Contin 60 mg every 8 hours scheduled. Discussed care with IR team who noted patient reporting no pain after celiac plexus block.  Recommended patient follow-up as outpatient for neurolysis.  Presented to bedside to see patient.  Patient laying comfortably in the bed.  Patient requested and placed her husband on speaker phone to join conversation.  Discussed patient's pain at this time.  Patient reporting her pain is at 0.  Patient incredibly anxious about pain returning due to its excruciating nature.  Spent time discussing benefits of nerve block with follow-up for neurolysis to alleviate pain without adverse effects of opioids.  Patient initially requesting opioids be increased though discussed that since her pain does not exist at this time, will not perform that.  Discussed willing to maintain current dose of opioids in case pain returns and can follow-up with outpatient palliative medicine team to make adjustments if needed.  Patient agreeing with this plan. Patient is very anxious to be discharged since her pain is improved.  Noted would reach out to hospitalist to coordinate and inform of wishes to go home.  Noted patient can continue follow-up  with outpatient PMT at [facility] (she is already established with provider there).  Discussed care with IDT including IR, hospitalist, RN, and outpatient PMT to coordinate care after visit.  Objective:   Vital Signs:  BP (!) 139/90 (BP Location: Right Arm)   Pulse 83   Temp 98.3 F (36.8 C) (Oral)   Resp 18   Ht 5\' 2"  (1.575 m)   Wt 52.6 kg   LMP 06/26/2013 Comment: low dose BCP  SpO2 99%   BMI 21.22 kg/m   Physical Exam: General: NAD, alert, chronically ill-appearing, cachectic Cardiovascular: RRR Respiratory: no increased work of breathing noted, not in respiratory distress Abdomen:  distended Neuro: A&Ox4, following commands easily Psych: appropriately answers all questions  Imaging: I personally reviewed recent imaging.   Assessment & Plan:   Assessment: Patient is a 58 year old with a past medical history of metastatic pancreatic cancer, hypertension, GERD, and portal vein occlusion and right upper extremity DVT on Eliquis who was admitted on 04/14/2023 for management of worsening abdominal pain.  During hospitalization patient has received management for abdominal pain in the setting of metastatic pancreatic cancer, bilateral hydronephrosis, and constipation.  Palliative medicine team consulted to assist with complex medical decision making and symptom management.  Recommendations/Plan: # Complex medical decision making/goals of care:  - Discussed care with patient as detailed above in HPI.  Patient's husband present on speaker phone during conversation.  Patient currently focused on getting out of the hospital to continue conversations about cancer directed therapy moving forward.  Oncology currently seeking if there are clinical trials available for patient.  Was encouraged patient  consider hospice.  Informed outpatient palliative medicine team to continue conversations in the outpatient setting.  -  Code Status: Full Code  # Symptom management: Patient is receiving these  palliative interventions for symptom management with an intent to improve quality of life.   -Severe acute pain in the setting of metastatic pancreatic cancer   -Continue MS Contin 60 mg every 8 hours.  Discussed with patient this may need to be decreased though patient incredibly anxious about pain returning send noted willing to continue at current dose in case pain returns after nerve block wears off.   -Continue oral hydromorphone 2-4 mg every 4 hours as needed   -Discontinue IV hydromorphone   -Patient received celiac plexus block on 04/18/2023 which brought patient's pain level down to "0".  Will be scheduled outpatient for follow-up neurolysis.  # Psychosocial Support:  -Husband  # Discharge Planning: Home with Palliative Services  -Plan to follow-up with outpatient palliative medicine team [facility]; patient already established with team there.  Discussed with: Patient, patient's husband, outpatient PMT providers, hospitalist, IR  Thank you for allowing the palliative care team to participate in the care [facility].  Alvester Morin, DO Palliative Care Provider PMT # [phone]  Personally spent 35 minutes in patient care including extensive chart review (labs, imaging, progress/consult notes, vital signs), medically appropraite exam, discussed with treatment team, education to patient, family, and staff, documenting clinical information, medication review and management, coordination of care, and available advanced directive documents.   If patient remains symptomatic despite maximum doses, please call PMT at [phone] between 0700 and 1900. Outside of these hours, please call attending, as PMT does not have night coverage.

## 2023-04-20 ENCOUNTER — Other Ambulatory Visit (HOSPITAL_COMMUNITY): Payer: Self-pay | Admitting: Internal Medicine

## 2023-04-20 DIAGNOSIS — C259 Malignant neoplasm of pancreas, unspecified: Secondary | ICD-10-CM

## 2023-04-20 DIAGNOSIS — N1330 Unspecified hydronephrosis: Secondary | ICD-10-CM

## 2023-04-22 ENCOUNTER — Other Ambulatory Visit: Payer: Self-pay

## 2023-04-22 ENCOUNTER — Encounter: Payer: Self-pay | Admitting: Hematology and Oncology

## 2023-04-22 ENCOUNTER — Encounter (HOSPITAL_COMMUNITY): Payer: Self-pay

## 2023-04-22 ENCOUNTER — Inpatient Hospital Stay (HOSPITAL_COMMUNITY)
Admission: EM | Admit: 2023-04-22 | Discharge: 2023-04-28 | DRG: 947 | Disposition: A | Discharge status: Home / Self Care | Attending: Internal Medicine | Admitting: Internal Medicine

## 2023-04-22 DIAGNOSIS — G43909 Migraine, unspecified, not intractable, without status migrainosus: Secondary | ICD-10-CM | POA: Diagnosis present

## 2023-04-22 DIAGNOSIS — I82621 Acute embolism and thrombosis of deep veins of right upper extremity: Secondary | ICD-10-CM | POA: Diagnosis present

## 2023-04-22 DIAGNOSIS — R109 Unspecified abdominal pain: Secondary | ICD-10-CM | POA: Diagnosis not present

## 2023-04-22 DIAGNOSIS — Z7189 Other specified counseling: Secondary | ICD-10-CM

## 2023-04-22 DIAGNOSIS — G893 Neoplasm related pain (acute) (chronic): Principal | ICD-10-CM | POA: Diagnosis present

## 2023-04-22 DIAGNOSIS — Z936 Other artificial openings of urinary tract status: Secondary | ICD-10-CM

## 2023-04-22 DIAGNOSIS — Z8249 Family history of ischemic heart disease and other diseases of the circulatory system: Secondary | ICD-10-CM | POA: Diagnosis not present

## 2023-04-22 DIAGNOSIS — Z803 Family history of malignant neoplasm of breast: Secondary | ICD-10-CM | POA: Diagnosis not present

## 2023-04-22 DIAGNOSIS — Z515 Encounter for palliative care: Secondary | ICD-10-CM | POA: Diagnosis not present

## 2023-04-22 DIAGNOSIS — Z823 Family history of stroke: Secondary | ICD-10-CM | POA: Diagnosis not present

## 2023-04-22 DIAGNOSIS — K219 Gastro-esophageal reflux disease without esophagitis: Secondary | ICD-10-CM | POA: Diagnosis present

## 2023-04-22 DIAGNOSIS — C259 Malignant neoplasm of pancreas, unspecified: Principal | ICD-10-CM | POA: Diagnosis present

## 2023-04-22 DIAGNOSIS — D6959 Other secondary thrombocytopenia: Secondary | ICD-10-CM | POA: Diagnosis present

## 2023-04-22 DIAGNOSIS — R102 Pelvic and perineal pain: Secondary | ICD-10-CM | POA: Diagnosis present

## 2023-04-22 DIAGNOSIS — Z833 Family history of diabetes mellitus: Secondary | ICD-10-CM

## 2023-04-22 DIAGNOSIS — Z888 Allergy status to other drugs, medicaments and biological substances status: Secondary | ICD-10-CM | POA: Diagnosis not present

## 2023-04-22 DIAGNOSIS — E876 Hypokalemia: Secondary | ICD-10-CM | POA: Diagnosis not present

## 2023-04-22 DIAGNOSIS — D630 Anemia in neoplastic disease: Secondary | ICD-10-CM | POA: Diagnosis present

## 2023-04-22 DIAGNOSIS — K5900 Constipation, unspecified: Secondary | ICD-10-CM | POA: Diagnosis present

## 2023-04-22 DIAGNOSIS — C799 Secondary malignant neoplasm of unspecified site: Secondary | ICD-10-CM | POA: Diagnosis not present

## 2023-04-22 DIAGNOSIS — Z79899 Other long term (current) drug therapy: Secondary | ICD-10-CM | POA: Diagnosis not present

## 2023-04-22 DIAGNOSIS — R609 Edema, unspecified: Secondary | ICD-10-CM | POA: Diagnosis not present

## 2023-04-22 DIAGNOSIS — I81 Portal vein thrombosis: Secondary | ICD-10-CM | POA: Diagnosis present

## 2023-04-22 DIAGNOSIS — I82411 Acute embolism and thrombosis of right femoral vein: Secondary | ICD-10-CM | POA: Diagnosis not present

## 2023-04-22 DIAGNOSIS — G4700 Insomnia, unspecified: Secondary | ICD-10-CM | POA: Diagnosis present

## 2023-04-22 DIAGNOSIS — K5903 Drug induced constipation: Secondary | ICD-10-CM | POA: Diagnosis not present

## 2023-04-22 DIAGNOSIS — Z800 Family history of malignant neoplasm of digestive organs: Secondary | ICD-10-CM

## 2023-04-22 DIAGNOSIS — I10 Essential (primary) hypertension: Secondary | ICD-10-CM | POA: Diagnosis not present

## 2023-04-22 DIAGNOSIS — I82421 Acute embolism and thrombosis of right iliac vein: Secondary | ICD-10-CM | POA: Diagnosis not present

## 2023-04-22 DIAGNOSIS — Z885 Allergy status to narcotic agent status: Secondary | ICD-10-CM

## 2023-04-22 LAB — URINALYSIS, ROUTINE W REFLEX MICROSCOPIC
Bilirubin Urine: NEGATIVE
Glucose, UA: NEGATIVE mg/dL
Ketones, ur: NEGATIVE mg/dL
Leukocytes,Ua: NEGATIVE
Nitrite: NEGATIVE
Protein, ur: 30 mg/dL — AB
RBC / HPF: >50 RBC/hpf (ref 0–5)
Specific Gravity, Urine: 1.014 (ref 1.005–1.030)
pH: 8 (ref 5.0–8.0)

## 2023-04-22 LAB — CBC WITH DIFFERENTIAL/PLATELET
Abs Immature Granulocytes: 0.05 10*3/uL (ref 0.00–0.07)
Basophils Absolute: 0 10*3/uL (ref 0.0–0.1)
Basophils Relative: 0 %
Eosinophils Absolute: 0 10*3/uL (ref 0.0–0.5)
Eosinophils Relative: 0 %
HCT: 32.5 % — ABNORMAL LOW (ref 36.0–46.0)
Hemoglobin: 10 g/dL — ABNORMAL LOW (ref 12.0–15.0)
Immature Granulocytes: 1 %
Lymphocytes Relative: 8 %
Lymphs Abs: 0.9 10*3/uL (ref 0.7–4.0)
MCH: 27.9 pg (ref 26.0–34.0)
MCHC: 30.8 g/dL (ref 30.0–36.0)
MCV: 90.8 fL (ref 80.0–100.0)
Monocytes Absolute: 1.5 10*3/uL — ABNORMAL HIGH (ref 0.1–1.0)
Monocytes Relative: 14 %
Neutro Abs: 8 10*3/uL — ABNORMAL HIGH (ref 1.7–7.7)
Neutrophils Relative %: 77 %
Platelets: 141 10*3/uL — ABNORMAL LOW (ref 150–400)
RBC: 3.58 MIL/uL — ABNORMAL LOW (ref 3.87–5.11)
RDW: 17.1 % — ABNORMAL HIGH (ref 11.5–15.5)
WBC: 10.5 10*3/uL (ref 4.0–10.5)
nRBC: 0 % (ref 0.0–0.2)

## 2023-04-22 LAB — COMPREHENSIVE METABOLIC PANEL
ALT: 15 U/L (ref 0–44)
AST: 30 U/L (ref 15–41)
Albumin: 3.6 g/dL (ref 3.5–5.0)
Alkaline Phosphatase: 86 U/L (ref 38–126)
Anion gap: 9 (ref 5–15)
BUN: 9 mg/dL (ref 6–20)
CO2: 26 mmol/L (ref 22–32)
Calcium: 8.8 mg/dL — ABNORMAL LOW (ref 8.9–10.3)
Chloride: 102 mmol/L (ref 98–111)
Creatinine, Ser: 0.62 mg/dL (ref 0.44–1.00)
GFR, Estimated: >60 mL/min (ref >60)
Glucose, Bld: 114 mg/dL — ABNORMAL HIGH (ref 70–99)
Potassium: 3.5 mmol/L (ref 3.5–5.1)
Sodium: 137 mmol/L (ref 135–145)
Total Bilirubin: 0.4 mg/dL (ref 0.0–1.2)
Total Protein: 7.2 g/dL (ref 6.5–8.1)

## 2023-04-22 MED ORDER — HYDROMORPHONE 1 MG/ML IV SOLN
INTRAVENOUS | Status: DC
  Administered 2023-04-22: 10.5 mg via INTRAVENOUS
  Administered 2023-04-22: 2.5 mg via INTRAVENOUS
  Administered 2023-04-22: 30 mg via INTRAVENOUS
  Administered 2023-04-23: 6.5 mg via INTRAVENOUS
  Administered 2023-04-23: 4.5 mg via INTRAVENOUS
  Filled 2023-04-22 (×2): qty 30

## 2023-04-22 MED ORDER — LORAZEPAM 0.5 MG PO TABS
0.5000 mg | ORAL_TABLET | Freq: Four times a day (QID) | ORAL | Status: DC | PRN
  Administered 2023-04-26 – 2023-04-27 (×3): 0.5 mg via ORAL
  Filled 2023-04-22 (×3): qty 1

## 2023-04-22 MED ORDER — CHLORHEXIDINE GLUCONATE CLOTH 2 % EX PADS
6.0000 | MEDICATED_PAD | Freq: Every day | CUTANEOUS | Status: DC
  Administered 2023-04-22 – 2023-04-27 (×6): 6 via TOPICAL

## 2023-04-22 MED ORDER — MORPHINE SULFATE ER 30 MG PO TBCR
60.0000 mg | EXTENDED_RELEASE_TABLET | ORAL | Status: DC
  Administered 2023-04-22 – 2023-04-28 (×20): 60 mg via ORAL
  Filled 2023-04-22 (×21): qty 2

## 2023-04-22 MED ORDER — HYDROMORPHONE HCL 2 MG PO TABS: 2.0000 mg | ORAL_TABLET | ORAL | Status: DC | PRN

## 2023-04-22 MED ORDER — ENOXAPARIN SODIUM 40 MG/0.4ML IJ SOSY
40.0000 mg | PREFILLED_SYRINGE | INTRAMUSCULAR | Status: DC
  Administered 2023-04-22: 40 mg via SUBCUTANEOUS
  Filled 2023-04-22: qty 0.4

## 2023-04-22 MED ORDER — FENTANYL CITRATE PF 50 MCG/ML IJ SOSY
100.0000 ug | PREFILLED_SYRINGE | Freq: Once | INTRAMUSCULAR | Status: AC
  Administered 2023-04-22: 100 ug via INTRAVENOUS
  Filled 2023-04-22: qty 2

## 2023-04-22 MED ORDER — HYDROMORPHONE HCL 1 MG/ML IJ SOLN
1.0000 mg | INTRAMUSCULAR | Status: DC | PRN
  Administered 2023-04-22: 1 mg via INTRAVENOUS

## 2023-04-22 MED ORDER — HYDROMORPHONE HCL 1 MG/ML IJ SOLN
1.0000 mg | INTRAMUSCULAR | Status: DC | PRN
  Administered 2023-04-22: 1 mg via INTRAVENOUS
  Filled 2023-04-22 (×2): qty 1

## 2023-04-22 MED ORDER — ENOXAPARIN SODIUM 40 MG/0.4ML IJ SOSY: 40.0000 mg | PREFILLED_SYRINGE | INTRAMUSCULAR | Status: DC

## 2023-04-22 MED ORDER — ACETAMINOPHEN 650 MG RE SUPP: 650.0000 mg | Freq: Four times a day (QID) | RECTAL | Status: DC | PRN

## 2023-04-22 MED ORDER — ONDANSETRON HCL 4 MG PO TABS: 4.0000 mg | ORAL_TABLET | Freq: Four times a day (QID) | ORAL | Status: DC | PRN

## 2023-04-22 MED ORDER — ELETRIPTAN HYDROBROMIDE 40 MG PO TABS: 40.0000 mg | ORAL_TABLET | ORAL | Status: DC | PRN

## 2023-04-22 MED ORDER — SODIUM CHLORIDE 0.9% FLUSH
10.0000 mL | INTRAVENOUS | Status: DC | PRN
  Administered 2023-04-28: 10 mL

## 2023-04-22 MED ORDER — SENNOSIDES-DOCUSATE SODIUM 8.6-50 MG PO TABS
2.0000 | ORAL_TABLET | Freq: Three times a day (TID) | ORAL | Status: DC
  Administered 2023-04-22 – 2023-04-27 (×15): 2 via ORAL
  Administered 2023-04-27: 1 via ORAL
  Administered 2023-04-27 – 2023-04-28 (×2): 2 via ORAL
  Filled 2023-04-22 (×18): qty 2

## 2023-04-22 MED ORDER — FENTANYL CITRATE PF 50 MCG/ML IJ SOSY
100.0000 ug | PREFILLED_SYRINGE | INTRAMUSCULAR | Status: AC | PRN
  Administered 2023-04-22 (×3): 100 ug via INTRAVENOUS
  Filled 2023-04-22 (×3): qty 2

## 2023-04-22 MED ORDER — ACETAMINOPHEN 325 MG PO TABS
650.0000 mg | ORAL_TABLET | Freq: Four times a day (QID) | ORAL | Status: DC | PRN
  Administered 2023-04-28: 650 mg via ORAL
  Filled 2023-04-22: qty 2

## 2023-04-22 MED ORDER — ALBUTEROL SULFATE (2.5 MG/3ML) 0.083% IN NEBU: 2.5000 mg | INHALATION_SOLUTION | RESPIRATORY_TRACT | Status: DC | PRN

## 2023-04-22 MED ORDER — SODIUM CHLORIDE 0.9 % IV SOLN: INTRAVENOUS | Status: DC

## 2023-04-22 MED ORDER — OLANZAPINE 10 MG PO TABS
10.0000 mg | ORAL_TABLET | Freq: Every evening | ORAL | Status: DC | PRN
  Administered 2023-04-23: 10 mg via ORAL
  Filled 2023-04-22 (×2): qty 1

## 2023-04-22 MED ORDER — ONDANSETRON HCL 4 MG/2ML IJ SOLN
4.0000 mg | Freq: Four times a day (QID) | INTRAMUSCULAR | Status: DC | PRN
  Administered 2023-04-22: 4 mg via INTRAVENOUS
  Filled 2023-04-22 (×2): qty 2

## 2023-04-22 MED ORDER — FLEET ENEMA RE ENEM: 1.0000 | ENEMA | Freq: Every day | RECTAL | Status: DC | PRN

## 2023-04-22 MED ORDER — POLYETHYLENE GLYCOL 3350 17 G PO PACK
17.0000 g | PACK | Freq: Every day | ORAL | Status: DC | PRN
  Administered 2023-04-23: 17 g via ORAL
  Filled 2023-04-22 (×2): qty 1

## 2023-04-22 MED ORDER — DROPERIDOL 2.5 MG/ML IJ SOLN
1.2500 mg | Freq: Once | INTRAMUSCULAR | Status: AC
  Administered 2023-04-22: 1.25 mg via INTRAVENOUS
  Filled 2023-04-22: qty 2

## 2023-04-22 NOTE — ED Provider Notes (Signed)
 [place] EMERGENCY DEPARTMENT AT [facility] Provider Note   CSN: [id number] Arrival date & time: 04/22/23  0123     History  Chief Complaint  Patient presents with   Pain    Kaitlin Ferrell is a 58 y.o. female.  The history is provided by the patient.  Abdominal Pain Pain location:  Periumbilical Pain radiates to:  Does not radiate Pain severity:  Severe Onset quality:  Gradual Timing:  Constant Progression:  Worsening Chronicity:  New Context: not alcohol use   Relieved by:  Nothing Worsened by:  Nothing Ineffective treatments:  None tried Associated symptoms: nausea   Associated symptoms: no anorexia, no belching, no chest pain, no chills, no constipation, no cough and no dysuria   Risk factors: no NSAID use   Patient with pancreatic cancer who is palliative presents with abdominal pain.  No fevers, no urinary complaints.      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 Authorizing Provider  acetaminophen (TYLENOL) 500 MG tablet Take 1,000 mg by mouth every 6 (six) hours as needed for mild pain (pain score 1-3) or headache.   Yes [provider]  eletriptan (RELPAX) 40 MG tablet Take 1 tablet (40 mg total) by mouth as needed for migraine or headache. May repeat in 2 hours if needed. Patient taking differently: Take 40 mg by mouth as needed for migraine or headache (and may repeat in 2 hours, if no relief). 09/22/14  Yes Nilda Riggs, NP  HYDROmorphone (DILAUDID) 2 MG tablet Take 1 tablet (2 mg total) by  mouth every 4 (four) hours as needed for severe pain (pain score 7-10). 04/10/23  Yes Pickenpack-Cousar, Arty Baumgartner, NP  lidocaine-prilocaine (EMLA) cream Apply 1 Application topically as needed. Patient taking differently: Apply 1 Application topically as needed (for port access). 09/29/22  Yes Pickenpack-Cousar, Arty Baumgartner, NP  LORazepam (ATIVAN) 0.5 MG tablet Take 0.5 mg by mouth daily as needed for anxiety. 03/22/22  Yes [provider]  morphine (MS CONTIN) 15 MG 12 hr tablet Take 1 tablet (15 mg total) by mouth every 8 (eight) hours. Patient taking differently: Take 30 mg by mouth See admin instructions. Take 30 mg by mouth at 6 AM, 2 PM, and 10 PM in conjunction with ONE 30 mg tablet to equal a total dose of 60 mg 04/13/23  Yes Pickenpack-Cousar, Athena N, NP  morphine (MS CONTIN) 30 MG 12 hr tablet Take 1 tablet (30 mg total) by mouth every 8 (eight) hours. Patient taking differently: Take 30 mg by mouth See admin instructions. Take 30 mg by mouth at 6 AM, 2 PM, and 10 PM in conjunction with TWO 15 mg tablets to equal a total dose of 60 mg 04/13/23  Yes Pickenpack-Cousar, Athena N, NP  OLANZapine (ZYPREXA) 10 MG tablet TAKE 1 TABLET BY MOUTH EVERYDAY AT BEDTIME Patient taking differently: Take 10 mg by mouth daily as needed. 04/20/22  Yes Georga Kaufmann T, PA-C  ondansetron (ZOFRAN) 8 MG tablet Take 1 tablet (8 mg total)  by mouth every 8 (eight) hours as needed for nausea or vomiting. 03/14/22  Yes Georga Kaufmann T, PA-C  ondansetron (ZOFRAN-ODT) 8 MG disintegrating tablet Take 1 tablet (8 mg total) by mouth every 8 (eight) hours as needed for nausea or vomiting. Patient taking differently: Take 8 mg by mouth every 8 (eight) hours as needed for nausea or vomiting (dissolve orally). 05/17/22  Yes Walisiewicz, Kaitlyn E, PA-C  pantoprazole (PROTONIX) 40 MG tablet Take 40 mg by mouth daily as needed (for GERD-like symptoms). 03/17/23  Yes [provider]  polyethylene glycol (MIRALAX / GLYCOLAX) 17 g  packet Take 17 g by mouth 3 (three) times daily. 04/19/23 07/18/23 Yes [place], Alvira Philips, DO  promethazine (PHENERGAN) 25 MG tablet Take 1 tablet (25 mg total) by mouth every 6 (six) hours as needed for nausea or vomiting. 05/04/22  Yes Jaci Standard, MD  scopolamine (TRANSDERM-SCOP) 1 MG/3DAYS Place 1 patch (1.5 mg total) onto the skin every 3 (three) days. 07/22/22  Yes Georga Kaufmann T, PA-C  senna-docusate (SENOKOT-S) 8.6-50 MG tablet Take 2 tablets by mouth 3 (three) times daily. 04/19/23 07/18/23 Yes [place], Eric J, DO  YUVAFEM 10 MCG TABS vaginal tablet Place 10 mcg vaginally 2 (two) times a week. 03/03/23  Yes [provider]  magnesium oxide (MAG-OX) 400 (241.3 MG) MG tablet TAKE 1 TABLET BY MOUTH TWICE A DAY Patient not taking: Reported on 04/22/2023 11/16/14   Nilda Riggs, NP  sodium chloride flush 0.9 % SOLN injection Place 5 mLs into feeding tube as needed. Flush each nephrostomy tube with 5 mm as needed if noted decreased output. 04/19/23   [place], Alvira Philips, DO      Allergies    Compazine [prochlorperazine edisylate], Propofol, Oxycodone-acetaminophen, Prochlorperazine, and Labetalol    Review of Systems   Review of Systems  Constitutional:  Negative for chills.  HENT:  Negative for facial swelling.   Respiratory:  Negative for cough.   Cardiovascular:  Negative for chest pain.  Gastrointestinal:  Positive for abdominal pain and nausea. Negative for anorexia and constipation.  Genitourinary:  Negative for dysuria.  All other systems reviewed and are negative.   Physical Exam Updated Vital Signs BP (!) 163/86 (BP Location: Left Arm)   Pulse 73   Temp 99.2 F (37.3 C) (Oral)   Resp 20   Ht 5\' 2"  (1.575 m)   Wt 52.6 kg   LMP 06/26/2013 Comment: low dose BCP  SpO2 97%   BMI 21.22 kg/m  Physical Exam Vitals and nursing note reviewed.  Constitutional:      General: She is not in acute distress.    Appearance: Normal appearance. She is well-developed.  HENT:      Head: Normocephalic and atraumatic.     Nose: Nose normal.  Eyes:     Pupils: Pupils are equal, round, and reactive to light.  Cardiovascular:     Rate and Rhythm: Normal rate and regular rhythm.     Pulses: Normal pulses.     Heart sounds: Normal heart sounds.  Pulmonary:     Effort: Pulmonary effort is normal. No respiratory distress.     Breath sounds: Normal breath sounds.  Abdominal:     General: Bowel sounds are normal. There is no distension.     Palpations: Abdomen is soft.     Tenderness: There is no abdominal tenderness. There is no guarding or rebound.  Musculoskeletal:        General: Normal range of motion.  Cervical back: Normal range of motion and neck supple.  Skin:    General: Skin is warm and dry.     Capillary Refill: Capillary refill takes less than 2 seconds.     Findings: No erythema or rash.  Neurological:     General: No focal deficit present.     Mental Status: She is alert and oriented to person, place, and time.     Deep Tendon Reflexes: Reflexes normal.  Psychiatric:        Mood and Affect: Mood normal.     ED Results / Procedures / Treatments   Labs (all labs ordered are listed, but only abnormal results are displayed) Results for orders placed or performed during the hospital encounter of 04/22/23  CBC with Differential   Collection Time: 04/22/23  2:01 AM  Result Value Ref Range   WBC 10.5 4.0 - 10.5 K/uL   RBC 3.58 (L) 3.87 - 5.11 MIL/uL   Hemoglobin 10.0 (L) 12.0 - 15.0 g/dL   HCT 59.5 (L) 63.8 - 75.6 %   MCV 90.8 80.0 - 100.0 fL   MCH 27.9 26.0 - 34.0 pg   MCHC 30.8 30.0 - 36.0 g/dL   RDW 43.3 (H) 29.5 - 18.8 %   Platelets 141 (L) 150 - 400 K/uL   nRBC 0.0 0.0 - 0.2 %   Neutrophils Relative % 77 %   Neutro Abs 8.0 (H) 1.7 - 7.7 K/uL   Lymphocytes Relative 8 %   Lymphs Abs 0.9 0.7 - 4.0 K/uL   Monocytes Relative 14 %   Monocytes Absolute 1.5 (H) 0.1 - 1.0 K/uL   Eosinophils Relative 0 %   Eosinophils Absolute 0.0 0.0 -  0.5 K/uL   Basophils Relative 0 %   Basophils Absolute 0.0 0.0 - 0.1 K/uL   Immature Granulocytes 1 %   Abs Immature Granulocytes 0.05 0.00 - 0.07 K/uL  Comprehensive metabolic panel   Collection Time: 04/22/23  2:01 AM  Result Value Ref Range   Sodium 137 135 - 145 mmol/L   Potassium 3.5 3.5 - 5.1 mmol/L   Chloride 102 98 - 111 mmol/L   CO2 26 22 - 32 mmol/L   Glucose, Bld 114 (H) 70 - 99 mg/dL   BUN 9 6 - 20 mg/dL   Creatinine, Ser 4.16 0.44 - 1.00 mg/dL   Calcium 8.8 (L) 8.9 - 10.3 mg/dL   Total Protein 7.2 6.5 - 8.1 g/dL   Albumin 3.6 3.5 - 5.0 g/dL   AST 30 15 - 41 U/L   ALT 15 0 - 44 U/L   Alkaline Phosphatase 86 38 - 126 U/L   Total Bilirubin 0.4 0.0 - 1.2 mg/dL   GFR, Estimated >60 >63 mL/min   Anion gap 9 5 - 15  Urinalysis, Routine w reflex microscopic -Urine, Clean Catch   Collection Time: 04/22/23  3:23 AM  Result Value Ref Range   Color, Urine YELLOW YELLOW   APPearance CLEAR CLEAR   Specific Gravity, Urine 1.014 1.005 - 1.030   pH 8.0 5.0 - 8.0   Glucose, UA NEGATIVE NEGATIVE mg/dL   Hgb urine dipstick MODERATE (A) NEGATIVE   Bilirubin Urine NEGATIVE NEGATIVE   Ketones, ur NEGATIVE NEGATIVE mg/dL   Protein, ur 30 (A) NEGATIVE mg/dL   Nitrite NEGATIVE NEGATIVE   Leukocytes,Ua NEGATIVE NEGATIVE   RBC / HPF >50 0 - 5 RBC/hpf   WBC, UA 0-5 0 - 5 WBC/hpf   Bacteria, UA RARE (A) NONE SEEN  Squamous Epithelial / HPF 0-5 0 - 5 /HPF   Mucus PRESENT    CT CELIAC PLEXUS BLOCK ANESTHETIC Result Date: 04/18/2023 INDICATION: Pancreatic cancer with intractable abdominal pain. EXAM: CT-GUIDED CELIAC PLEXUS NERVE BLOCK, DIAGNOSTIC AND THERAPEUTIC COMPARISON:  CT AP, 04/14/2023. MEDICATIONS: 80 mg Kenalog and 30 mL 0.5% bupivacaine. ANESTHESIA/SEDATION: Moderate (conscious) sedation was employed during this procedure. A total of Versed 3 mg and Fentanyl 150 mcg was administered intravenously. Moderate Sedation Time: 35 minutes. The patient's level of consciousness and vital  signs were monitored continuously by radiology nursing throughout the procedure under my direct supervision. CONTRAST:  None. FLUOROSCOPY TIME:  CT dose; 296 mGycm COMPLICATIONS: None immediate. PROCEDURE: RADIATION DOSE REDUCTION: This exam was performed according to the departmental dose-optimization program which includes automated exposure control, adjustment of the mA and/or kV according to patient size and/or use of iterative reconstruction technique. Informed consent was obtained from the patient following an explanation of the procedure, risks, benefits and alternatives. A time out was performed prior to the initiation of the procedure. The patient was positioned prone on the CT table and a limited CT was performed for procedural planning demonstrating an adequate window at the upper abdomen. The procedure was planned. A timeout was performed prior to the initiation of the procedure. The operative site was prepped and draped in the usual sterile fashion. Appropriate trajectory was confirmed with a 22 gauge spinal needle after the adjacent tissues were anesthetized with 1% Lidocaine with epinephrine. Under intermittent CT guidance, 15 cm 21-gauge Chiba needles were inserted via a posterior approach and the needle tips were positioned in the retroperitoneal space immediately anterolateral to the aorta, at the region of the celiac trunk. A small amount of dilute contrast was injected through the needles to confirm position. A solution containing 2 mL of 40 mg/mL Kenalog and 15 mL of Bupivacaine was mixed and injected through each needle. Intermittent CT scanning confirmed appropriate spread into the extra vascular spaces. The needles were removed and hemostasis was achieved with manual compression. A limited postprocedural CT was negative for hemorrhage or additional complication. A dressing was placed. The patient tolerated the procedure well without immediate postprocedural complication. IMPRESSION:  Successful CT-guided diagnostic and therapeutic celiac plexus nerve block, as above. PLAN: The patient will be re-evaluated in the postprocedural setting and again in 3-6 months to assess the efficacy of this nerve block. If successful, the patient may be a candidate for future celiac plexus nerve blocks versus neurolysis. Roanna Banning, MD [facility] Radiology Electronically Signed   By: Roanna Banning M.D.   On: 04/18/2023 16:43   IR NEPHROSTOMY PLACEMENT BILATERAL Result Date: 04/17/2023 CLINICAL DATA:  Metastatic pancreatic adenocarcinoma. New bilateral moderately severe hydronephrosis, acute abdominal pain EXAM: BILATERAL PERCUTANEOUS NEPHROSTOMY CATHETER PLACEMENT UNDER ULTRASOUND AND FLUOROSCOPIC GUIDANCE FLUOROSCOPY: Radiation Exposure Index (as provided by the fluoroscopic device): 3 mGy air Kerma TECHNIQUE: The procedure, risks (including but not limited to bleeding, infection, organ damage ), benefits, and alternatives were explained to the patient. Questions regarding the procedure were encouraged and answered. The patient understands and consents to the procedure. bilateralflank regions prepped with Betadine, draped in usual sterile fashion, infiltrated locally with 1% lidocaine. As antibiotic prophylaxis, Rocephin 2 g was ordered pre-procedure and administered intravenously within one hour of incision. Intravenous Fentanyl and Versed 2mg  were administered by RN during a total moderate (conscious) sedation time of 18 minutes; the patient's level of consciousness and physiological / cardiorespiratory status were monitored continuously  by radiology RN under my direct supervision. Under real-time ultrasound guidance, a 21-gauge micropuncture needle was advanced into a right posterior lower pole calyx. Ultrasound image documentation was saved. Urine spontaneously returned through the needle. Needle was exchanged over a guidewire for transitional  dilator. Contrast injection confirmed appropriate positioning. Catheter was exchanged over a guidewire for a 10 French pigtail catheter, formed centrally within the right renal collecting system. Contrast injection confirms appropriate positioning and patency. In similar fashion, Under real-time ultrasound guidance, a 21-gauge micropuncture needle was advanced into a left posterior lower pole calyx. Ultrasound image documentation was saved. Urine spontaneously returned through the needle. Needle was exchanged over a guidewire for transitional dilator. Contrast injection confirmed appropriate positioning. Catheter was exchanged over a guidewire for a 10 French pigtail catheter, formed centrally within the left renal collecting system. Contrast injection confirms appropriate positioning and patency. Both catheters were secured externally with 0 Prolene sutures and placed to external drain bags. The patient tolerated the procedure well. COMPLICATIONS: COMPLICATIONS none IMPRESSION: 1. Technically successful bilateral percutaneous nephrostomy catheter placement. Electronically Signed   By: Corlis Leak M.D.   On: 04/17/2023 16:36   CT ABDOMEN PELVIS W CONTRAST Result Date: 04/14/2023 CLINICAL DATA:  Acute generalized abdominal pain. History of pancreatic cancer. EXAM: CT ABDOMEN AND PELVIS WITH CONTRAST TECHNIQUE: Multidetector CT imaging of the abdomen and pelvis was performed using the standard protocol following bolus administration of intravenous contrast. RADIATION DOSE REDUCTION: This exam was performed according to the departmental dose-optimization program which includes automated exposure control, adjustment of the mA and/or kV according to patient size and/or use of iterative reconstruction technique. CONTRAST:  OMNIPAQUE IOHEXOL 300 MG/ML  SOLN COMPARISON:  March 20, 2023. FINDINGS: Lower chest: No acute abnormality. Hepatobiliary: No cholelithiasis or biliary dilatation is noted. Multiple hepatic  lesions are again noted consistent with metastatic disease. The largest measures 2.8 x 2.2 cm in posterior segment of right hepatic lobe which is enlarged compared to prior exam. 2.6 x 2.6 cm lesion is noted in superior portion of right hepatic lobe which is enlarged compared to prior exam as well. Pancreas: 3.5 x 2.3 cm pancreatic head mass is noted consistent with malignancy. Mild dilatation of common bile duct is noted. 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 It also seems to surround the proximal portion of the superior mesenteric artery, but it remains patent. Spleen: Normal in size without focal abnormality. Adrenals/Urinary Tract: Adrenal glands are unremarkable. Decreased enhancement of left kidney is noted. Moderate bilateral hydroureteronephrosis is noted without evidence of obstructing calculus, concerning for distal ureteral occlusion. Urinary bladder is only minimally distended. Stomach/Bowel: The stomach is unremarkable. There is no evidence of small bowel dilatation. Large amount of stool and fluid is seen throughout the colon. The appendix appears normal. There appears to be moderate to severe wall thickening of the rectum and possibly distal sigmoid colon suggesting inflammation or possibly malignancy. There are noted probable enhancing soft tissue lesions in this area suggesting peritoneal carcinomatosis. Vascular/Lymphatic: Abdominal aorta is unremarkable. 1.4 cm right external iliac lymph node is noted concerning for metastatic disease. 8 mm left periaortic lymph node is noted. 9 mm right periaortic lymph node is noted as well. Reproductive: Status post hysterectomy. No adnexal masses. Other: No ascites or hernia is noted. Musculoskeletal: No acute or significant osseous findings. IMPRESSION: There is interval development of moderate bilateral hydroureteronephrosis without evidence of obstructing calculus. This most likely is  due  to obstruction of distal ureters due to external compression, most likely due to possible peritoneal implants or other metastatic disease in the pelvis. There is noted severe wall thickening of the rectum and probable distal sigmoid colon due to either inflammation or metastatic disease. Hepatic metastatic lesions are again noted which appear to be increased in size compared to prior exam. 3.5 x 2.3 cm pancreatic head mass is noted consistent with malignancy. 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 This mass also appears to encase the superior mesenteric artery, but the artery remains patent. Retroperitoneal and right external iliac adenopathy is noted consistent with metastatic disease. Electronically Signed   By: Lupita Raider M.D.   On: 04/14/2023 12:41   DG Abdomen 1 View Result Date: 04/14/2023 CLINICAL DATA:  Abdominal pain and constipation EXAM: ABDOMEN - 1 VIEW COMPARISON:  CT abdomen and pelvis dated 03/20/2023 FINDINGS: Nonobstructive bowel gas pattern. No pneumatosis. Large volume stool throughout the colon. No abnormal radio-opaque calculi or mass effect. No acute or substantial osseous abnormality. The sacrum and coccyx are partially obscured by overlying bowel contents. IMPRESSION: Large volume stool throughout the colon. Electronically Signed   By: Agustin Cree M.D.   On: 04/14/2023 10:01    Radiology No results found.  Procedures Procedures    Medications Ordered in ED Medications  0.9 %  sodium chloride infusion ( Intravenous New Bag/Given 04/22/23 0428)  fentaNYL (SUBLIMAZE) injection 100 mcg (100 mcg Intravenous Given 04/22/23 0439)  fentaNYL (SUBLIMAZE) injection 100 mcg (100 mcg Intravenous Given 04/22/23 0307)  droperidol (INAPSINE) 2.5 MG/ML injection 1.25 mg (1.25 mg Intravenous Given 04/22/23 0422)    ED Course/ Medical Decision Making/ A&P                                 Medical Decision  Making Patient with abdominal pain not controlled by home pain med  Amount and/or Complexity of Data Reviewed Independent Historian: spouse    Details: See above  External Data Reviewed: labs, radiology and notes.    Details: Previous admission reviewed  Labs: ordered.    Details: Normal white count 10.5, low hemoglobin 10, low platelets 141.  Normal sodium 137, normal potassium 3.5, normal urine    Risk Prescription drug management. Parenteral controlled substances. Decision regarding hospitalization.    Final Clinical Impression(s) / ED Diagnoses Final diagnoses:  None    The patient appears reasonably stabilized for admission considering the current resources, flow, and capabilities available in the ED at this time, and I doubt any other [facility] requiring further screening and/or treatment in the ED prior to admission.  Rx / DC Orders ED Discharge Orders     None         Polly Barner, MD 04/22/23 2956

## 2023-04-22 NOTE — Consult Note (Signed)
 Consultation Note Date: 04/22/2023   Patient Name: Kaitlin Ferrell  DOB: Mar 24, 1965  MRN: [id number]  Age / Sex: 58 y.o., female   PCP: Bernadette Hoit, MD Referring Physician: Maryln Gottron, MD  Reason for Consultation: Pain control     Chief Complaint/History of Present Illness:   Patient is a 58 year old female with a past medical history of metastatic pancreatic cancer, hypertension, GERD, and portal vein occlusion as well as right upper extremity DVT on Eliquis who was admitted on 04/22/2023 for worsening cancer pain.  Patient had recently been hospitalized and was just discharged on 04/19/2023 after receiving celiac plexus nerve block which had brought patient's pain to 0.  Patient returning to the hospital noting her pain has returned.  IR consulted to determine if celiac plexus neurolysis can be expedited.  Palliative medicine team consulted to assist with complex medical decision making. Of note, patient recently seen by this palliative medicine provider during last hospitalization.  Reviewed EMR prior to presenting to bedside.  Patient continuing to receive her home medication of MS Contin 60 mg 3 times daily.  Since admission, patient has received as needed IV fentanyl 100 mcg x 3 doses and as needed IV Dilaudid 1 mg x 1 dose.  Patient also required as needed IV Zofran 4 mg x 1 dose.  When presenting to bedside to see patient, IR provider exiting room.  Able to discuss care plan with interventional radiology.  Hoping patient can proceed with celiac plexus neurolysis early next week.  Discussed if planning for neurolysis, will work to use medications as bridge until then.  Presented to bedside to see patient.  Patient doubled over laying in bed.  Patient appears to be actively in pain.  Patient asking for more pain medication at this time. Introduced myself as a member of the palliative medicine team.  Patient remembered this provider as had spoken on day of her discharge.   Patient having the same pain she was in the hospital for previously which is in her bilateral flanks and back.  This had been completely relieved by celiac plexus nerve block.  Reviewed medications received so far for pain management.  Patient does feel the IV Dilaudid assist with pain management when received.  Discussed based on patient's worsening pain and frequent as needed needs, can start IV Dilaudid PCA.  Hope is to use this as bridge until patient can get neurolysis performed.  Patient agreeing with this plan.  All questions answered at that time.  Noted palliative medicine team will continue to follow along to make adjustments as needed.  Discussed care with hospitalist and RN to update on care plan.  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   treated with 4 cycles of actinomycin D from 12-02-11 thru 01-13-12   H/O molar pregnancy, antepartum    Hypertension    Migraine    Miscarriage    Social History   Socioeconomic History   Marital status: Married    Spouse name: [name]   Number of children: 2   Years of education: college   Highest education level: Not on file  Occupational History    Comment: CMA-  [employer]  Tobacco Use   Smoking status: Never   Smokeless tobacco: Never  Substance  and Sexual Activity   Alcohol use: Yes    Alcohol/week: 1.0 standard drink of alcohol    Types: 1 Glasses of wine per week    Comment: occas   Drug use: No   Sexual activity: Not Currently    Birth control/protection: Pill  Other Topics Concern   Not on file  Social History Narrative   Patient is married [name]). Patient works part time at [employer].   Right handed.   Patient has her CMA.   Caffeine- None         Social Drivers of Manufacturing engineer Strain: Not on file  Food Insecurity: No Food Insecurity (04/22/2023)   Hunger Vital Sign    Worried About Running Out of Food in the Last Year: Never true    Ran Out of Food in the Last Year: Never true  Transportation Needs: No Transportation Needs (04/22/2023)   PRAPARE - Administrator, Civil Service (Medical): No    Lack of Transportation (Non-Medical): No  Physical Activity: Not on file  Stress: Not on file  Social Connections: Not on file   Family History  Adopted: Yes  Problem Relation Age of Onset   Breast cancer Mother        dx > 50   Heart attack Mother    Diabetes Father    Breast cancer Maternal Grandmother        ? < 50   Stroke Paternal Grandmother    Breast cancer Maternal Aunt        dx > 50   Breast cancer Maternal Aunt        dx > 50   Pancreatic cancer Maternal Uncle    Pancreatic cancer Cousin    Scheduled Meds:  morphine  60 mg Oral 3 times per day   senna-docusate  2 tablet Oral TID   Continuous Infusions:  sodium chloride 125 mL/hr at 04/22/23 0643   PRN Meds:.acetaminophen **OR** acetaminophen, albuterol, eletriptan, HYDROmorphone (DILAUDID) injection, HYDROmorphone, LORazepam, OLANZapine, ondansetron **OR** ondansetron (ZOFRAN) IV, polyethylene glycol Allergies  Allergen Reactions   Compazine [Prochlorperazine Edisylate]     Very agitated   Propofol Other (See Comments)    Headache   Oxycodone-Acetaminophen Itching and Other (See Comments)    Welts, too   Prochlorperazine    Labetalol Hives, Itching, Swelling and Rash   CBC:    Component Value Date/Time   WBC 10.5 04/22/2023 0201   HGB 10.0 (L) 04/22/2023 0201   HGB 9.2 (L) 04/07/2023 0957   HGB 11.8 02/21/2012 1205   HCT 32.5 (L) 04/22/2023 0201   HCT 36.0 02/21/2012 1205   PLT 141 (L) 04/22/2023 0201   PLT 163 04/07/2023 0957   PLT 165 02/21/2012 1205   MCV 90.8 04/22/2023 0201   MCV 87.7 02/21/2012 1205   NEUTROABS 8.0 (H) 04/22/2023 0201    NEUTROABS 3.5 02/21/2012 1205   LYMPHSABS 0.9 04/22/2023 0201   LYMPHSABS 1.6 02/21/2012 1205   MONOABS 1.5 (H) 04/22/2023 0201   MONOABS 0.3 02/21/2012 1205   EOSABS 0.0 04/22/2023 0201   EOSABS 0.2 02/21/2012 1205   BASOSABS 0.0 04/22/2023 0201   BASOSABS 0.0 02/21/2012 1205   Comprehensive Metabolic Panel:    Component Value Date/Time   NA 137 04/22/2023 0201   NA 140 02/21/2012 1205   K 3.5 04/22/2023 0201   K 3.4 (L) 02/21/2012 1205   CL 102 04/22/2023 0201   CL 111 (H) 02/21/2012 1205   CO2  26 04/22/2023 0201   CO2 20 (L) 02/21/2012 1205   BUN 9 04/22/2023 0201   BUN 11.0 02/21/2012 1205   CREATININE 0.62 04/22/2023 0201   CREATININE 0.88 04/07/2023 0957   CREATININE 0.9 02/21/2012 1205   GLUCOSE 114 (H) 04/22/2023 0201   GLUCOSE 70 02/21/2012 1205   CALCIUM 8.8 (L) 04/22/2023 0201   CALCIUM 9.2 02/21/2012 1205   AST 30 04/22/2023 0201   AST 17 04/07/2023 0957   AST 13 02/21/2012 1205   ALT 15 04/22/2023 0201   ALT 9 04/07/2023 0957   ALT <6 Repeated and Verified 02/21/2012 1205   ALKPHOS 86 04/22/2023 0201   ALKPHOS 40 02/21/2012 1205   BILITOT 0.4 04/22/2023 0201   BILITOT 0.4 04/07/2023 0957   BILITOT 0.45 02/21/2012 1205   PROT 7.2 04/22/2023 0201   PROT 7.6 02/21/2012 1205   ALBUMIN 3.6 04/22/2023 0201   ALBUMIN 3.8 02/21/2012 1205    Physical Exam: Vital Signs: BP (!) 161/80 (BP Location: Left Arm)   Pulse 83   Temp 98.5 F (36.9 C) (Oral)   Resp 18   Ht 5\' 2"  (1.575 m)   Wt 52.6 kg   LMP 06/26/2013 Comment: low dose BCP  SpO2 97%   BMI 21.22 kg/m  SpO2: SpO2: 97 % O2 Device: O2 Device: Room Air O2 Flow Rate:   Intake/output summary:  Intake/Output Summary (Last 24 hours) at 04/22/2023 0852 Last data filed at 04/22/2023 0644 Gross per 24 hour  Intake 283.33 ml  Output --  Net 283.33 ml   LBM:   Baseline Weight: Weight: 52.6 kg Most recent weight: Weight: 52.6 kg  General: Appears to be in pain, doubled over laying in bed.   Chronically ill-appearing Respiratory: no increased work of breathing noted, not in respiratory distress Neuro: A&Ox4, following commands easily Psych: appropriately answers all questions          Palliative Performance Scale: 50%               Additional Data Reviewed: Recent Labs    04/22/23 0201  WBC 10.5  HGB 10.0*  PLT 141*  NA 137  BUN 9  CREATININE 0.62    Imaging: CT CELIAC PLEXUS BLOCK ANESTHETIC INDICATION: Pancreatic cancer with intractable abdominal pain.  EXAM: CT-GUIDED CELIAC PLEXUS NERVE BLOCK, DIAGNOSTIC AND THERAPEUTIC  COMPARISON:  CT AP, 04/14/2023.  MEDICATIONS: 80 mg Kenalog and 30 mL 0.5% bupivacaine.  ANESTHESIA/SEDATION: Moderate (conscious) sedation was employed during this procedure. A total of Versed 3 mg and Fentanyl 150 mcg was administered intravenously.  Moderate Sedation Time: 35 minutes. The patient's level of consciousness and vital signs were monitored continuously by radiology nursing throughout the procedure under my direct supervision.  CONTRAST:  None.  FLUOROSCOPY TIME:  CT dose; 296 mGycm  COMPLICATIONS: None immediate.  PROCEDURE: RADIATION DOSE REDUCTION: This exam was performed according to the departmental dose-optimization program which includes automated exposure control, adjustment of the mA and/or kV according to patient size and/or use of iterative reconstruction technique.  Informed consent was obtained from the patient following an explanation of the procedure, risks, benefits and alternatives. A time out was performed prior to the initiation of the procedure.  The patient was positioned prone on the CT table and a limited CT was performed for procedural planning demonstrating an adequate window at the upper abdomen. The procedure was planned. A timeout was performed prior to the initiation of the procedure. The operative site was prepped and draped in the usual sterile  fashion.  Appropriate  trajectory was confirmed with a 22 gauge spinal needle after the adjacent tissues were anesthetized with 1% Lidocaine with epinephrine.  Under intermittent CT guidance, 15 cm 21-gauge Chiba needles were inserted via a posterior approach and the needle tips were positioned in the retroperitoneal space immediately anterolateral to the aorta, at the region of the celiac trunk. A small amount of dilute contrast was injected through the needles to confirm position.  A solution containing 2 mL of 40 mg/mL Kenalog and 15 mL of Bupivacaine was mixed and injected through each needle. Intermittent CT scanning confirmed appropriate spread into the extra vascular spaces.  The needles were removed and hemostasis was achieved with manual compression. A limited postprocedural CT was negative for hemorrhage or additional complication. A dressing was placed. The patient tolerated the procedure well without immediate postprocedural complication.  IMPRESSION: Successful CT-guided diagnostic and therapeutic celiac plexus nerve block, as above.  PLAN: The patient will be re-evaluated in the postprocedural setting and again in 3-6 months to assess the efficacy of this nerve block.  If successful, the patient may be a candidate for future celiac plexus nerve blocks versus neurolysis.  Roanna Banning, MD  [facility]  [facility]  Electronically Signed   By: Roanna Banning M.D.   On: 04/18/2023 16:43    I personally reviewed recent imaging.   Palliative Care Assessment and Plan Summary of Established Goals of Care and Medical Treatment Preferences   Patient is a 58 year old female with a past medical history of metastatic pancreatic cancer, hypertension, GERD, and portal vein occlusion as well as right upper extremity DVT on Eliquis who was admitted on 04/22/2023 for worsening cancer pain.  Patient had recently been hospitalized and was just  discharged on 04/19/2023 after receiving celiac plexus nerve block which had brought patient's pain to 0.  Patient returning to the hospital noting her pain has returned.  IR consulted to determine if celiac plexus neurolysis can be expedited.  Palliative medicine team consulted to assist with complex medical decision making. Of note, patient recently seen by this palliative medicine provider during last hospitalization.  # Symptom management Patient is receiving these palliative interventions for symptom management with an intent to improve quality of life.   -Severe acute on chronic pain in the setting of metastatic pancreatic cancer. Patient had celiac plexus nerve block performed on 04/18/2023 which had completely alleviated pain and patient was discharged on 04/19/2023.  Unfortunately nerve block has worn off and patient is returning with the same pain.Since admission, patient has received as needed IV fentanyl 100 mcg x 3 doses and as needed IV Dilaudid 1 mg x 1 dose.  Patient also required as needed IV Zofran 4 mg x 1 dose.   -Start IV Dilaudid PCA 0.5 mg q. 10-minute as needed bolus.  No continuous infusion noted since receiving oral long-acting pain medications.  Continue to make adjustments based on patient's symptom burden.   -Change as needed IV Dilaudid to 1 mg every hour for breakthrough pain after PCA bolus   -Continue MS Contin 60 mg every 8 hours during the day   -Agree with plan for celiac plexus neurolysis since block had completely relieved patient's pain.  # Discharge Planning:  To Be Determined  Thank you for allowing the palliative care team to participate in the care [facility].  Alvester Morin, DO Palliative Care Provider PMT # [phone]  If patient remains symptomatic despite maximum doses, please call PMT  at [phone] between 0700 and 1900. Outside of these hours, please call attending, as PMT does not have night coverage.

## 2023-04-22 NOTE — Consult Note (Signed)
 Chief Complaint: Intractable abdominal/pelvic/back pain, progressive metastatic pancreatic adenocarcinoma; recent celiac plexus block on 3/11; now referred for celiac plexus neurolysis  Referring Provider(s): Ikramullah,M/Mims,L  Supervising Physician: Malachy Moan  Patient Status: [facility] - In-pt  History of Present Illness: Kaitlin Ferrell is a 58 y.o. female with past medical history significant for metastatic pancreatic adenocarcinoma, hypertension, GERD, portal vein occlusion, right upper extremity DVT, migraines admitted to [facility] on 04/22/23 with recurrent intractable cancer related abd/pelvic pain.  Patient was discharged from [facility] on 3/12 after bilateral nephrostomy tube placements 3/10 and celiac plexus block by IR team on 3/11.  Patient had significant short-term pain relief with her celiac plexus block and was already scheduled for outpatient neurolysis on 4/29 but pain has now returned.  Request now received for earlier celiac plexus neurolysis.  Patient currently denies fever, headache, chest pain, dyspnea, cough, or bleeding.  She does have some occasional nausea and vomiting.  Patient also known to IR service from prior port placements in 2013 and 2024 and liver mass biopsy in 2024.   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   treated with 4 cycles of actinomycin D from 12-02-11 thru 01-13-12   H/O molar pregnancy, antepartum    Hypertension    Migraine    Miscarriage     Past Surgical History:  Procedure Laterality Date   BREAST BIOPSY  2001   Left   BREAST LUMPECTOMY     DILATION AND CURETTAGE OF UTERUS  1992, 09/2011   DILATION AND EVACUATION  2011   INSERTION OF MESH N/A 09/03/2013   Procedure: INSERTION OF MESH;  Surgeon:  Ardeth Sportsman, MD;  Location: [facility] ORS;  Service: General;  Laterality: N/A;   IR CHEST FLUORO  04/14/2022   IR IMAGING GUIDED PORT INSERTION  03/22/2022   IR IMAGING GUIDED PORT INSERTION  05/02/2022   IR NEPHROSTOMY PLACEMENT RIGHT  04/17/2023   IR PORT REPAIR CENTRAL VENOUS ACCESS DEVICE  04/19/2022   IR REMOVAL TUN CV CATH W/O FL  05/02/2022   IR US LIVER BIOPSY  03/22/2022   PORTACATH PLACEMENT  11/2011   PORTACATH PLACEMENT     REMOVAL  MARCH 2014   VENTRAL HERNIA REPAIR N/A 09/03/2013   Procedure: LAPAROSCOPIC VENTRAL WALL HERNIA REPAIR;  Surgeon: Ardeth Sportsman, MD;  Location: [facility] ORS;  Service: General;  Laterality: N/A;   WISDOM TOOTH EXTRACTION     in 11th grade    Allergies: Compazine [prochlorperazine edisylate], Propofol, Oxycodone-acetaminophen, Prochlorperazine, and Labetalol  Medications: Prior to Admission medications   Medication Sig Start Date End Date Taking? Authorizing Provider  acetaminophen (TYLENOL) 500 MG tablet Take 1,000 mg by mouth every 6 (six) hours as needed for mild pain (pain score 1-3) or headache.   Yes [provider]  eletriptan (RELPAX) 40 MG tablet Take 1 tablet (40 mg total) by mouth as needed for migraine or headache. May repeat in 2 hours if needed. Patient taking differently: Take 40 mg by mouth as needed for migraine or headache (and may repeat in 2 hours, if no relief). 09/22/14  Yes Nilda Riggs, NP  HYDROmorphone (DILAUDID) 2 MG tablet Take 1 tablet (2 mg total) by mouth every 4 (four) hours as needed for severe pain (pain score 7-10).  04/10/23  Yes Pickenpack-Cousar, Arty Baumgartner, NP  lidocaine-prilocaine (EMLA) cream Apply 1 Application topically as needed. Patient taking differently: Apply 1 Application topically as needed (for port access). 09/29/22  Yes Pickenpack-Cousar, Arty Baumgartner, NP  LORazepam (ATIVAN) 0.5 MG tablet Take 0.5 mg by mouth daily as needed for anxiety. 03/22/22  Yes [provider]  morphine (MS CONTIN) 15 MG  12 hr tablet Take 1 tablet (15 mg total) by mouth every 8 (eight) hours. Patient taking differently: Take 30 mg by mouth See admin instructions. Take 30 mg by mouth at 6 AM, 2 PM, and 10 PM in conjunction with ONE 30 mg tablet to equal a total dose of 60 mg 04/13/23  Yes Pickenpack-Cousar, Athena N, NP  morphine (MS CONTIN) 30 MG 12 hr tablet Take 1 tablet (30 mg total) by mouth every 8 (eight) hours. Patient taking differently: Take 30 mg by mouth See admin instructions. Take 30 mg by mouth at 6 AM, 2 PM, and 10 PM in conjunction with TWO 15 mg tablets to equal a total dose of 60 mg 04/13/23  Yes Pickenpack-Cousar, Athena N, NP  OLANZapine (ZYPREXA) 10 MG tablet TAKE 1 TABLET BY MOUTH EVERYDAY AT BEDTIME Patient taking differently: Take 10 mg by mouth daily as needed. 04/20/22  Yes Georga Kaufmann T, PA-C  ondansetron (ZOFRAN) 8 MG tablet Take 1 tablet (8 mg total) by mouth every 8 (eight) hours as needed for nausea or vomiting. 03/14/22  Yes Georga Kaufmann T, PA-C  ondansetron (ZOFRAN-ODT) 8 MG disintegrating tablet Take 1 tablet (8 mg total) by mouth every 8 (eight) hours as needed for nausea or vomiting. Patient taking differently: Take 8 mg by mouth every 8 (eight) hours as needed for nausea or vomiting (dissolve orally). 05/17/22  Yes Walisiewicz, Kaitlyn E, PA-C  pantoprazole (PROTONIX) 40 MG tablet Take 40 mg by mouth daily as needed (for GERD-like symptoms). 03/17/23  Yes [provider]  polyethylene glycol (MIRALAX / GLYCOLAX) 17 g packet Take 17 g by mouth 3 (three) times daily. 04/19/23 07/18/23 Yes [place], Alvira Philips, DO  promethazine (PHENERGAN) 25 MG tablet Take 1 tablet (25 mg total) by mouth every 6 (six) hours as needed for nausea or vomiting. 05/04/22  Yes Jaci Standard, MD  scopolamine (TRANSDERM-SCOP) 1 MG/3DAYS Place 1 patch (1.5 mg total) onto the skin every 3 (three) days. 07/22/22  Yes Georga Kaufmann T, PA-C  senna-docusate (SENOKOT-S) 8.6-50 MG tablet Take 2 tablets by mouth 3 (three)  times daily. 04/19/23 07/18/23 Yes [place], Eric J, DO  YUVAFEM 10 MCG TABS vaginal tablet Place 10 mcg vaginally 2 (two) times a week. 03/03/23  Yes [provider]  magnesium oxide (MAG-OX) 400 (241.3 MG) MG tablet TAKE 1 TABLET BY MOUTH TWICE A DAY Patient not taking: Reported on 04/22/2023 11/16/14   Nilda Riggs, NP  sodium chloride flush 0.9 % SOLN injection Place 5 mLs into feeding tube as needed. Flush each nephrostomy tube with 5 mm as needed if noted decreased output. 04/19/23   [place], Eric J, DO     Family History  Adopted: Yes  Problem Relation Age of Onset   Breast cancer Mother        dx > 50   Heart attack Mother    Diabetes Father    Breast cancer Maternal Grandmother        ? < 50   Stroke Paternal Grandmother    Breast cancer Maternal Aunt  dx > 50   Breast cancer Maternal Aunt        dx > 50   Pancreatic cancer Maternal Uncle    Pancreatic cancer Cousin     Social History   Socioeconomic History   Marital status: Married    Spouse name: [name]   Number of children: 2   Years of education: college   Highest education level: Not on file  Occupational History    Comment: CMA-  [employer]  Tobacco Use   Smoking status: Never   Smokeless tobacco: Never  Substance and Sexual Activity   Alcohol use: Yes    Alcohol/week: 1.0 standard drink of alcohol    Types: 1 Glasses of wine per week    Comment: occas   Drug use: No   Sexual activity: Not Currently    Birth control/protection: Pill  Other Topics Concern   Not on file  Social History Narrative   Patient is married [name]). Patient works part time at [employer].   Right handed.   Patient has her CMA.   Caffeine- None         Social Drivers of Corporate investment banker Strain: Not on file  Food Insecurity: No Food Insecurity (04/22/2023)   Hunger Vital Sign    Worried About Running Out of Food in the Last Year: Never true    Ran Out of Food in the Last Year: Never true   Transportation Needs: No Transportation Needs (04/22/2023)   PRAPARE - Administrator, Civil Service (Medical): No    Lack of Transportation (Non-Medical): No  Physical Activity: Not on file  Stress: Not on file  Social Connections: Not on file       Review of Systems see above  Vital Signs: BP (!) 157/107 (BP Location: Right Arm)   Pulse 93   Temp 98.7 F (37.1 C) (Oral)   Resp 16   Ht 5\' 2"  (1.575 m)   Wt 116 lb (52.6 kg)   LMP 06/26/2013 Comment: low dose BCP  SpO2 99%   BMI 21.22 kg/m   Advance Care Plan: No documents on file  Physical Exam: Awake, alert.  Chest clear to auscultation bilaterally.  Clean, intact left chest wall Port-A-Cath.  Heart with regular rate and rhythm.  Abdomen soft, few bowel sounds, mod tender lower abdominal/pelvic/flank regions; bilateral nephrostomies in place draining yellow urine.  No lower extremity edema  Imaging: CT CELIAC PLEXUS BLOCK ANESTHETIC Result Date: 04/18/2023 INDICATION: Pancreatic cancer with intractable abdominal pain. EXAM: CT-GUIDED CELIAC PLEXUS NERVE BLOCK, DIAGNOSTIC AND THERAPEUTIC COMPARISON:  CT AP, 04/14/2023. MEDICATIONS: 80 mg Kenalog and 30 mL 0.5% bupivacaine. ANESTHESIA/SEDATION: Moderate (conscious) sedation was employed during this procedure. A total of Versed 3 mg and Fentanyl 150 mcg was administered intravenously. Moderate Sedation Time: 35 minutes. The patient's level of consciousness and vital signs were monitored continuously by radiology nursing throughout the procedure under my direct supervision. CONTRAST:  None. FLUOROSCOPY TIME:  CT dose; 296 mGycm COMPLICATIONS: None immediate. PROCEDURE: RADIATION DOSE REDUCTION: This exam was performed according to the departmental dose-optimization program which includes automated exposure control, adjustment of the mA and/or kV according to patient size and/or use of iterative reconstruction technique. Informed consent was obtained from the patient  following an explanation of the procedure, risks, benefits and alternatives. A time out was performed prior to the initiation of the procedure. The patient was positioned prone on the CT table and a limited CT was performed for procedural planning  demonstrating an adequate window at the upper abdomen. The procedure was planned. A timeout was performed prior to the initiation of the procedure. The operative site was prepped and draped in the usual sterile fashion. Appropriate trajectory was confirmed with a 22 gauge spinal needle after the adjacent tissues were anesthetized with 1% Lidocaine with epinephrine. Under intermittent CT guidance, 15 cm 21-gauge Chiba needles were inserted via a posterior approach and the needle tips were positioned in the retroperitoneal space immediately anterolateral to the aorta, at the region of the celiac trunk. A small amount of dilute contrast was injected through the needles to confirm position. A solution containing 2 mL of 40 mg/mL Kenalog and 15 mL of Bupivacaine was mixed and injected through each needle. Intermittent CT scanning confirmed appropriate spread into the extra vascular spaces. The needles were removed and hemostasis was achieved with manual compression. A limited postprocedural CT was negative for hemorrhage or additional complication. A dressing was placed. The patient tolerated the procedure well without immediate postprocedural complication. IMPRESSION: Successful CT-guided diagnostic and therapeutic celiac plexus nerve block, as above. PLAN: The patient will be re-evaluated in the postprocedural setting and again in 3-6 months to assess the efficacy of this nerve block. If successful, the patient may be a candidate for future celiac plexus nerve blocks versus neurolysis. Roanna Banning, MD [facility] Radiology Electronically Signed   By: Roanna Banning M.D.   On: 04/18/2023 16:43   IR NEPHROSTOMY PLACEMENT  BILATERAL Result Date: 04/17/2023 CLINICAL DATA:  Metastatic pancreatic adenocarcinoma. New bilateral moderately severe hydronephrosis, acute abdominal pain EXAM: BILATERAL PERCUTANEOUS NEPHROSTOMY CATHETER PLACEMENT UNDER ULTRASOUND AND FLUOROSCOPIC GUIDANCE FLUOROSCOPY: Radiation Exposure Index (as provided by the fluoroscopic device): 3 mGy air Kerma TECHNIQUE: The procedure, risks (including but not limited to bleeding, infection, organ damage ), benefits, and alternatives were explained to the patient. Questions regarding the procedure were encouraged and answered. The patient understands and consents to the procedure. bilateralflank regions prepped with Betadine, draped in usual sterile fashion, infiltrated locally with 1% lidocaine. As antibiotic prophylaxis, Rocephin 2 g was ordered pre-procedure and administered intravenously within one hour of incision. Intravenous Fentanyl and Versed 2mg  were administered by RN during a total moderate (conscious) sedation time of 18 minutes; the patient's level of consciousness and physiological / cardiorespiratory status were monitored continuously by radiology RN under my direct supervision. Under real-time ultrasound guidance, a 21-gauge micropuncture needle was advanced into a right posterior lower pole calyx. Ultrasound image documentation was saved. Urine spontaneously returned through the needle. Needle was exchanged over a guidewire for transitional dilator. Contrast injection confirmed appropriate positioning. Catheter was exchanged over a guidewire for a 10 French pigtail catheter, formed centrally within the right renal collecting system. Contrast injection confirms appropriate positioning and patency. In similar fashion, Under real-time ultrasound guidance, a 21-gauge micropuncture needle was advanced into a left posterior lower pole calyx. Ultrasound image documentation was saved. Urine spontaneously returned through the needle. Needle was exchanged  over a guidewire for transitional dilator. Contrast injection confirmed appropriate positioning. Catheter was exchanged over a guidewire for a 10 French pigtail catheter, formed centrally within the left renal collecting system. Contrast injection confirms appropriate positioning and patency. Both catheters were secured externally with 0 Prolene sutures and placed to external drain bags. The patient tolerated the procedure well. COMPLICATIONS: COMPLICATIONS none IMPRESSION: 1. Technically successful bilateral percutaneous nephrostomy catheter placement. Electronically Signed   By: Corlis Leak M.D.   On: 04/17/2023 16:36  CT ABDOMEN PELVIS W CONTRAST Result Date: 04/14/2023 CLINICAL DATA:  Acute generalized abdominal pain. History of pancreatic cancer. EXAM: CT ABDOMEN AND PELVIS WITH CONTRAST TECHNIQUE: Multidetector CT imaging of the abdomen and pelvis was performed using the standard protocol following bolus administration of intravenous contrast. RADIATION DOSE REDUCTION: This exam was performed according to the departmental dose-optimization program which includes automated exposure control, adjustment of the mA and/or kV according to patient size and/or use of iterative reconstruction technique. CONTRAST:  OMNIPAQUE IOHEXOL 300 MG/ML  SOLN COMPARISON:  March 20, 2023. FINDINGS: Lower chest: No acute abnormality. Hepatobiliary: No cholelithiasis or biliary dilatation is noted. Multiple hepatic lesions are again noted consistent with metastatic disease. The largest measures 2.8 x 2.2 cm in posterior segment of right hepatic lobe which is enlarged compared to prior exam. 2.6 x 2.6 cm lesion is noted in superior portion of right hepatic lobe which is enlarged compared to prior exam as well. Pancreas: 3.5 x 2.3 cm pancreatic head mass is noted consistent with malignancy. Mild dilatation of common bile duct is noted. This mass appears to encase and occludes the superior mesenteric vein near its insertion  with the portal vein. It also seems to surround the proximal portion of the superior mesenteric artery, but it remains patent. Spleen: Normal in size without focal abnormality. Adrenals/Urinary Tract: Adrenal glands are unremarkable. Decreased enhancement of left kidney is noted. Moderate bilateral hydroureteronephrosis is noted without evidence of obstructing calculus, concerning for distal ureteral occlusion. Urinary bladder is only minimally distended. Stomach/Bowel: The stomach is unremarkable. There is no evidence of small bowel dilatation. Large amount of stool and fluid is seen throughout the colon. The appendix appears normal. There appears to be moderate to severe wall thickening of the rectum and possibly distal sigmoid colon suggesting inflammation or possibly malignancy. There are noted probable enhancing soft tissue lesions in this area suggesting peritoneal carcinomatosis. Vascular/Lymphatic: Abdominal aorta is unremarkable. 1.4 cm right external iliac lymph node is noted concerning for metastatic disease. 8 mm left periaortic lymph node is noted. 9 mm right periaortic lymph node is noted as well. Reproductive: Status post hysterectomy. No adnexal masses. Other: No ascites or hernia is noted. Musculoskeletal: No acute or significant osseous findings. IMPRESSION: There is interval development of moderate bilateral hydroureteronephrosis without evidence of obstructing calculus. This most likely is due to obstruction of distal ureters due to external compression, most likely due to possible peritoneal implants or other metastatic disease in the pelvis. There is noted severe wall thickening of the rectum and probable distal sigmoid colon due to either inflammation or metastatic disease. Hepatic metastatic lesions are again noted which appear to be increased in size compared to prior exam. 3.5 x 2.3 cm pancreatic head mass is noted consistent with malignancy. This appears to be causing occlusion of the  superior mesenteric vein with collateral flow to the splenic vein. This mass also appears to encase the superior mesenteric artery, but the artery remains patent. Retroperitoneal and right external iliac adenopathy is noted consistent with metastatic disease. Electronically Signed   By: Lupita Raider M.D.   On: 04/14/2023 12:41   DG Abdomen 1 View Result Date: 04/14/2023 CLINICAL DATA:  Abdominal pain and constipation EXAM: ABDOMEN - 1 VIEW COMPARISON:  CT abdomen and pelvis dated 03/20/2023 FINDINGS: Nonobstructive bowel gas pattern. No pneumatosis. Large volume stool throughout the colon. No abnormal radio-opaque calculi or mass effect. No acute or substantial osseous abnormality. The sacrum and coccyx are partially obscured by overlying bowel  contents. IMPRESSION: Large volume stool throughout the colon. Electronically Signed   By: Agustin Cree M.D.   On: 04/14/2023 10:01    Labs:  CBC: Recent Labs    04/17/23 0323 04/18/23 0312 04/19/23 0242 04/22/23 0201  WBC 5.9 6.6 5.8 10.5  HGB 7.9* 8.3* 8.9* 10.0*  HCT 27.1* 27.9* 29.2* 32.5*  PLT 104* 138* 143* 141*    COAGS: Recent Labs    04/17/23 0323  INR 1.3*    BMP: Recent Labs    04/17/23 0323 04/18/23 0312 04/19/23 0242 04/22/23 0201  NA 138 136 136 137  K 3.6 3.5 4.4 3.5  CL 105 102 105 102  CO2 25 24 25 26   GLUCOSE 106* 111* 182* 114*  BUN 8 9 10 9   CALCIUM 8.3* 8.2* 8.4* 8.8*  CREATININE 1.05* 0.87 0.85 0.62  GFRNONAA >60 >60 >60 >60    LIVER FUNCTION TESTS: Recent Labs    04/15/23 0306 04/16/23 0321 04/17/23 0323 04/22/23 0201  BILITOT 0.5 0.7 0.7 0.4  AST 14* 13* 14* 30  ALT 8 8 6 15   ALKPHOS 64 62 70 86  PROT 6.6 6.3* 6.1* 7.2  ALBUMIN 3.0* 2.9* 2.8* 3.6    TUMOR MARKERS: No results for input(s): "AFPTM", "CEA", "CA199", "CHROMGRNA" in the last 8760 hours.  Assessment and Plan: 58 y.o. female with past medical history significant for metastatic pancreatic adenocarcinoma, hypertension, GERD, portal  vein occlusion, right upper extremity DVT, migraines admitted to [facility] on 04/22/23 with recurrent intractable cancer related abd/pelvic pain.  Patient was discharged from [facility] on 3/12 after bilateral nephrostomy tube placements 3/10 and celiac plexus block by IR team on 3/11.  Patient had significant short-term pain relief with her celiac plexus block and was already scheduled for outpatient neurolysis on 4/29 but pain has now returned.  Request now received for earlier celiac plexus neurolysis.  Patient currently denies fever, headache, chest pain, dyspnea, cough, or bleeding.  She does have some occasional nausea and vomiting.  Patient also known to IR service from prior port placements in 2013 and 2024 and liver mass biopsy in 2024.  Case/imaging reviewed by Dr. Archer Asa. Details/risks of procedure, including but not limited to, internal bleeding, infection, injury to adjacent structures, possible loose stools discussed with patient with her understanding and consent.  Will tentatively plan procedure for 3/17 schedule permitting.   Thank you for allowing our service to participate in Aileene Lanum 's care.  Electronically Signed: D. Jeananne Rama, PA-C   04/22/2023, 10:41 AM      I spent a total of  20 minutes in face to face in clinical consultation, greater than 50% of which was counseling/coordinating care for image guided celiac plexus neurolysis

## 2023-04-22 NOTE — ED Triage Notes (Signed)
 Pt presents w/ mid abdominal pain that radiates across to the left & right side along w/ left leg pain. Recently admitted last Friday for 5 days for same sx, just d/c on Wednesday. Procedure of bil stents(Mon) and celiac plexus nerve block(Tuesday) w/ 2 nephrostomy bags placed. Pt denies fever, chills,or n/v/d.

## 2023-04-22 NOTE — Plan of Care (Signed)

## 2023-04-22 NOTE — H&P (Signed)
 History and Physical  Kaitlin Ferrell ZOX:[id number] DOB: [birth year] DOA: 04/22/2023  PCP: Bernadette Hoit, MD   Chief Complaint: Intractable pain  HPI: Kaitlin Ferrell is a 58 y.o. female with medical history significant for metastatic pancreatic adenocarcinoma not on treatment due to cancer progression, hypertension, GERD, portal vein occlusion and right upper extremity DVT being admitted to the hospital with recurrent intractable cancer-related pain.  She was just discharged from this hospital on 04/19/2023 after bilateral nephrostomy tube placement and celiac plexus block by IR which were required due to cancer progression.  She had significant pain relief with her celiac plexus block, with plan for repeat block versus neurolysis with IR as an outpatient next month.  When she left the hospital, she had no pain, but unfortunately after only a couple of days her pain has returned.  States that her pain is the same in location and nature, except now it is even more severe.  The pain is in her bilateral flanks, and her low back.  She denies any fevers, vomiting, diarrhea, any change in location or nature of her pain, or any other significantly changed or new symptoms.  She was given IV fentanyl in the emergency department, states this has not touched her pain and it is still 10/10.  Review of Systems: Please see HPI for pertinent positives and negatives. A complete 10 system review of systems are otherwise negative.  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   treated with 4 cycles of actinomycin D from 12-02-11 thru 01-13-12   H/O molar pregnancy, antepartum    Hypertension    Migraine    Miscarriage    Past Surgical History:  Procedure Laterality Date   BREAST BIOPSY  2001   Left   BREAST  LUMPECTOMY     DILATION AND CURETTAGE OF UTERUS  1992, 09/2011   DILATION AND EVACUATION  2011   INSERTION OF MESH N/A 09/03/2013   Procedure: INSERTION OF MESH;  Surgeon: Ardeth Sportsman, MD;  Location: [facility];  Service: General;  Laterality: N/A;   IR CHEST FLUORO  04/14/2022   IR IMAGING GUIDED PORT INSERTION  03/22/2022   IR IMAGING GUIDED PORT INSERTION  05/02/2022   IR NEPHROSTOMY PLACEMENT RIGHT  04/17/2023   IR PORT REPAIR CENTRAL VENOUS ACCESS DEVICE  04/19/2022   IR REMOVAL TUN CV CATH W/O FL  05/02/2022   IR US LIVER BIOPSY  03/22/2022   PORTACATH PLACEMENT  11/2011   PORTACATH PLACEMENT     REMOVAL  MARCH 2014   VENTRAL HERNIA REPAIR N/A 09/03/2013   Procedure: LAPAROSCOPIC VENTRAL WALL HERNIA REPAIR;  Surgeon: Ardeth Sportsman, MD;  Location: [facility];  Service: General;  Laterality: N/A;   WISDOM TOOTH EXTRACTION     in 11th grade   Social History:  reports that she has never smoked. She has never used smokeless tobacco. She reports current alcohol use of about 1.0 standard drink of alcohol per week. She reports that she does not use drugs.  Allergies  Allergen Reactions   Compazine [Prochlorperazine Edisylate]     Very agitated   Propofol Other (See Comments)    Headache   Oxycodone-Acetaminophen Itching and Other (See Comments)    Welts, too   Prochlorperazine    Labetalol Hives, Itching, Swelling and Rash  Family History  Adopted: Yes  Problem Relation Age of Onset   Breast cancer Mother        dx > 50   Heart attack Mother    Diabetes Father    Breast cancer Maternal Grandmother        ? < 50   Stroke Paternal Grandmother    Breast cancer Maternal Aunt        dx > 50   Breast cancer Maternal Aunt        dx > 50   Pancreatic cancer Maternal Uncle    Pancreatic cancer Cousin      Prior to Admission medications   Medication Sig Start Date End Date Taking? Authorizing Provider  acetaminophen (TYLENOL) 500 MG tablet Take 1,000 mg by mouth every 6 (six) hours as  needed for mild pain (pain score 1-3) or headache.   Yes [provider]  eletriptan (RELPAX) 40 MG tablet Take 1 tablet (40 mg total) by mouth as needed for migraine or headache. May repeat in 2 hours if needed. Patient taking differently: Take 40 mg by mouth as needed for migraine or headache (and may repeat in 2 hours, if no relief). 09/22/14  Yes Nilda Riggs, NP  HYDROmorphone (DILAUDID) 2 MG tablet Take 1 tablet (2 mg total) by mouth every 4 (four) hours as needed for severe pain (pain score 7-10). 04/10/23  Yes Pickenpack-Cousar, Arty Baumgartner, NP  lidocaine-prilocaine (EMLA) cream Apply 1 Application topically as needed. Patient taking differently: Apply 1 Application topically as needed (for port access). 09/29/22  Yes Pickenpack-Cousar, Arty Baumgartner, NP  LORazepam (ATIVAN) 0.5 MG tablet Take 0.5 mg by mouth daily as needed for anxiety. 03/22/22  Yes [provider]  morphine (MS CONTIN) 15 MG 12 hr tablet Take 1 tablet (15 mg total) by mouth every 8 (eight) hours. Patient taking differently: Take 30 mg by mouth See admin instructions. Take 30 mg by mouth at 6 AM, 2 PM, and 10 PM in conjunction with ONE 30 mg tablet to equal a total dose of 60 mg 04/13/23  Yes Pickenpack-Cousar, Athena N, NP  morphine (MS CONTIN) 30 MG 12 hr tablet Take 1 tablet (30 mg total) by mouth every 8 (eight) hours. Patient taking differently: Take 30 mg by mouth See admin instructions. Take 30 mg by mouth at 6 AM, 2 PM, and 10 PM in conjunction with TWO 15 mg tablets to equal a total dose of 60 mg 04/13/23  Yes Pickenpack-Cousar, Athena N, NP  OLANZapine (ZYPREXA) 10 MG tablet TAKE 1 TABLET BY MOUTH EVERYDAY AT BEDTIME Patient taking differently: Take 10 mg by mouth daily as needed. 04/20/22  Yes Georga Kaufmann T, PA-C  ondansetron (ZOFRAN) 8 MG tablet Take 1 tablet (8 mg total) by mouth every 8 (eight) hours as needed for nausea or vomiting. 03/14/22  Yes Georga Kaufmann T, PA-C  ondansetron (ZOFRAN-ODT) 8 MG  disintegrating tablet Take 1 tablet (8 mg total) by mouth every 8 (eight) hours as needed for nausea or vomiting. Patient taking differently: Take 8 mg by mouth every 8 (eight) hours as needed for nausea or vomiting (dissolve orally). 05/17/22  Yes Walisiewicz, Kaitlyn E, PA-C  pantoprazole (PROTONIX) 40 MG tablet Take 40 mg by mouth daily as needed (for GERD-like symptoms). 03/17/23  Yes [provider]  polyethylene glycol (MIRALAX / GLYCOLAX) 17 g packet Take 17 g by mouth 3 (three) times daily. 04/19/23 07/18/23 Yes [place], Eric J, DO  promethazine (PHENERGAN) 25 MG tablet  Take 1 tablet (25 mg total) by mouth every 6 (six) hours as needed for nausea or vomiting. 05/04/22  Yes Jaci Standard, MD  scopolamine (TRANSDERM-SCOP) 1 MG/3DAYS Place 1 patch (1.5 mg total) onto the skin every 3 (three) days. 07/22/22  Yes Georga Kaufmann T, PA-C  senna-docusate (SENOKOT-S) 8.6-50 MG tablet Take 2 tablets by mouth 3 (three) times daily. 04/19/23 07/18/23 Yes [place], Eric J, DO  YUVAFEM 10 MCG TABS vaginal tablet Place 10 mcg vaginally 2 (two) times a week. 03/03/23  Yes [provider]  magnesium oxide (MAG-OX) 400 (241.3 MG) MG tablet TAKE 1 TABLET BY MOUTH TWICE A DAY Patient not taking: Reported on 04/22/2023 11/16/14   Nilda Riggs, NP  sodium chloride flush 0.9 % SOLN injection Place 5 mLs into feeding tube as needed. Flush each nephrostomy tube with 5 mm as needed if noted decreased output. 04/19/23   [place], Eric J, DO    Physical Exam: BP (!) 161/80 (BP Location: Left Arm)   Pulse 83   Temp 98.5 F (36.9 C) (Oral)   Resp 18   Ht 5\' 2"  (1.575 m)   Wt 52.6 kg   LMP 06/26/2013 Comment: low dose BCP  SpO2 97%   BMI 21.22 kg/m  General:  Alert, oriented, calm, in no acute distress, lying on her side trying to nap Cardiovascular: RRR, no murmurs or rubs, no peripheral edema  Respiratory: clear to auscultation bilaterally, no wheezes, no crackles  Abdomen: soft, tender with  voluntary guarding, nondistended, normal bowel tones heard.  Bilateral nephrostomy tubes in place. Skin: dry, no rashes  Musculoskeletal: no joint effusions, normal range of motion  Psychiatric: appropriate affect, normal speech  Neurologic: extraocular muscles intact, clear speech, moving all extremities with intact sensorium         Labs on Admission:  Basic Metabolic Panel: Recent Labs  Lab 04/16/23 0321 04/17/23 0323 04/18/23 0312 04/19/23 0242 04/22/23 0201  NA 139 138 136 136 137  K 3.8 3.6 3.5 4.4 3.5  CL 107 105 102 105 102  CO2 26 25 24 25 26   GLUCOSE 97 106* 111* 182* 114*  BUN 9 8 9 10 9   CREATININE 1.00 1.05* 0.87 0.85 0.62  CALCIUM 8.3* 8.3* 8.2* 8.4* 8.8*  MG 2.4  --  2.1  --   --    Liver Function Tests: Recent Labs  Lab 04/16/23 0321 04/17/23 0323 04/22/23 0201  AST 13* 14* 30  ALT 8 6 15   ALKPHOS 62 70 86  BILITOT 0.7 0.7 0.4  PROT 6.3* 6.1* 7.2  ALBUMIN 2.9* 2.8* 3.6   No results for input(s): "LIPASE", "AMYLASE" in the last 168 hours. No results for input(s): "AMMONIA" in the last 168 hours. CBC: Recent Labs  Lab 04/16/23 0321 04/17/23 0323 04/18/23 0312 04/19/23 0242 04/22/23 0201  WBC 5.1 5.9 6.6 5.8 10.5  NEUTROABS 3.1 3.7  --   --  8.0*  HGB 7.9* 7.9* 8.3* 8.9* 10.0*  HCT 27.0* 27.1* 27.9* 29.2* 32.5*  MCV 94.4 94.4 93.0 90.7 90.8  PLT 130* 104* 138* 143* 141*   Cardiac Enzymes: No results for input(s): "CKTOTAL", "CKMB", "CKMBINDEX", "TROPONINI" in the last 168 hours. BNP (last 3 results) No results for input(s): "BNP" in the last 8760 hours.  ProBNP (last 3 results) No results for input(s): "PROBNP" in the last 8760 hours.  CBG: No results for input(s): "GLUCAP" in the last 168 hours.  Radiological Exams on Admission: No results found.  Assessment/Plan Kaitlin Ferrell  Kaitlin Ferrell is a 58 y.o. female with medical history significant for metastatic pancreatic adenocarcinoma not on treatment due to cancer progression,  hypertension, GERD, portal vein occlusion and right upper extremity DVT being admitted to the hospital with recurrent intractable cancer-related pain.   Intractable back and abdominal pain-due to known metastatic progressive adenocarcinoma of the pancreas.  She takes MS Contin, p.o. Dilaudid at home.  She had significant but temporary relief with celiac plexus block with IR 04/18/2023. -Observation admission -Continue home MS Contin and p.o. Dilaudid -IV Dilaudid for breakthrough pain -Will consult IR, she may benefit from more expeditious celiac plexus block/neurolysis -Will consult palliative care for their input -Continue bowel regimen  History of DVT-diagnosed April 2024, Eliquis was discontinued due to thrombocytopenia  Metastatic pancreatic adenocarcinoma-currently not on treatment due to cancer progression -Dr. Leonides Schanz added to her inpatient treatment team  Insomnia-Zyprexa at bedtime as needed    Code Status: Full Code  DVT prophylaxis-Lovenox  Consults called: None  Admission status: Observation  Time spent: 55 minutes  Holli Rengel Sharlette Dense MD [facility] Pager [phone]  If 7PM-7AM, please contact night-coverage [email or web address] Password [facility]  04/22/2023, 8:44 AM

## 2023-04-23 DIAGNOSIS — G893 Neoplasm related pain (acute) (chronic): Secondary | ICD-10-CM | POA: Diagnosis not present

## 2023-04-23 DIAGNOSIS — R109 Unspecified abdominal pain: Secondary | ICD-10-CM | POA: Diagnosis not present

## 2023-04-23 DIAGNOSIS — C259 Malignant neoplasm of pancreas, unspecified: Secondary | ICD-10-CM | POA: Diagnosis not present

## 2023-04-23 DIAGNOSIS — Z515 Encounter for palliative care: Secondary | ICD-10-CM | POA: Diagnosis not present

## 2023-04-23 DIAGNOSIS — Z79899 Other long term (current) drug therapy: Secondary | ICD-10-CM | POA: Diagnosis not present

## 2023-04-23 LAB — BASIC METABOLIC PANEL
Anion gap: 5 (ref 5–15)
BUN: 8 mg/dL (ref 6–20)
CO2: 25 mmol/L (ref 22–32)
Calcium: 8.3 mg/dL — ABNORMAL LOW (ref 8.9–10.3)
Chloride: 105 mmol/L (ref 98–111)
Creatinine, Ser: 0.43 mg/dL — ABNORMAL LOW (ref 0.44–1.00)
GFR, Estimated: >60 mL/min (ref >60)
Glucose, Bld: 130 mg/dL — ABNORMAL HIGH (ref 70–99)
Potassium: 3.9 mmol/L (ref 3.5–5.1)
Sodium: 135 mmol/L (ref 135–145)

## 2023-04-23 LAB — PROTIME-INR
INR: 1.3 — ABNORMAL HIGH (ref 0.8–1.2)
Prothrombin Time: 16 s — ABNORMAL HIGH (ref 11.4–15.2)

## 2023-04-23 LAB — CBC
HCT: 27.5 % — ABNORMAL LOW (ref 36.0–46.0)
Hemoglobin: 8.2 g/dL — ABNORMAL LOW (ref 12.0–15.0)
MCH: 27.3 pg (ref 26.0–34.0)
MCHC: 29.8 g/dL — ABNORMAL LOW (ref 30.0–36.0)
MCV: 91.7 fL (ref 80.0–100.0)
Platelets: 141 10*3/uL — ABNORMAL LOW (ref 150–400)
RBC: 3 MIL/uL — ABNORMAL LOW (ref 3.87–5.11)
RDW: 17.3 % — ABNORMAL HIGH (ref 11.5–15.5)
WBC: 10.7 10*3/uL — ABNORMAL HIGH (ref 4.0–10.5)
nRBC: 0 % (ref 0.0–0.2)

## 2023-04-23 MED ORDER — HYDROMORPHONE 1 MG/ML IV SOLN
INTRAVENOUS | Status: DC
  Administered 2023-04-23: 17.22 mg via INTRAVENOUS
  Administered 2023-04-23: 6.3 mg via INTRAVENOUS
  Administered 2023-04-23: 9.1 mg via INTRAVENOUS
  Administered 2023-04-23: 30 mg via INTRAVENOUS
  Administered 2023-04-24: 3.5 mL via INTRAVENOUS
  Administered 2023-04-24 (×2): 30 mg via INTRAVENOUS
  Administered 2023-04-24: 4.2 mg via INTRAVENOUS
  Administered 2023-04-24: 2 mg via INTRAVENOUS
  Administered 2023-04-25: 10 mg via INTRAVENOUS
  Administered 2023-04-25: 11 mg via INTRAVENOUS
  Administered 2023-04-25: 6 mg via INTRAVENOUS
  Administered 2023-04-25: 30 mg via INTRAVENOUS
  Administered 2023-04-25: 14 mg via INTRAVENOUS
  Administered 2023-04-25 (×2): 5 mg via INTRAVENOUS
  Administered 2023-04-25: 30 mg via INTRAVENOUS
  Administered 2023-04-26: 5.2 mg via INTRAVENOUS
  Administered 2023-04-26: 6.99 mg via INTRAVENOUS
  Administered 2023-04-26: 30 mg via INTRAVENOUS
  Filled 2023-04-23 (×5): qty 30

## 2023-04-23 MED ORDER — ENOXAPARIN SODIUM 40 MG/0.4ML IJ SOSY
40.0000 mg | PREFILLED_SYRINGE | INTRAMUSCULAR | Status: DC
  Administered 2023-04-25: 40 mg via SUBCUTANEOUS
  Filled 2023-04-23: qty 0.4

## 2023-04-23 NOTE — Progress Notes (Signed)
 PROGRESS NOTE  Kaitlin Ferrell ZOX:[id number] DOB: 19-May-1965 DOA: 04/22/2023 PCP: Bernadette Hoit, MD   LOS: 1 day   Brief Narrative / Interim history: 58 year old female with metastatic pancreatic adenocarcinoma, not on treatment due to cancer progression, HTN, portal vein occlusion and right upper extremity DVT comes to the hospital with intractable abdominal and back pain.  She was recently admitted and discharged 3/12 after bilateral nephrostomy tube placement and celiac plexus block by IR, had significant relief but only for 2 days, and pain returned and she was admitted again.   Subjective / 24h Interval events: Pain is better while on the PCA pump  Assesement and Plan: Principal Problem:   Abdominal pain Active Problems:   High risk medication use   Cancer related pain   Medication management   Principal problem Cancer related pain-palliative care consulted to assist with pain control, continue oral morphine as well as a PCA pump -IR consulted, plans are in place for celiac plexus neurolysis possibly tomorrow  Active problems History of DVT-was on Eliquis, discontinued due to thrombocytopenia but I am not clear when this was done  Stage IV pancreatic adenocarcinoma -seeing Dr. Leonides Schanz as an outpatient, has metastatic involvement liver, unfortunately she is not on treatment due to progression of the disease  Bilateral nephrostomy tubes -noted  Anemia, thrombocytopenia -related to malignancy  Scheduled Meds:  Chlorhexidine Gluconate Cloth  6 each Topical Daily   [START ON 04/25/2023] enoxaparin (LOVENOX) injection  40 mg Subcutaneous Q24H   HYDROmorphone   Intravenous Q4H   morphine  60 mg Oral 3 times per day   senna-docusate  2 tablet Oral TID   Continuous Infusions:  sodium chloride 125 mL/hr at 04/23/23 0401   PRN Meds:.acetaminophen **OR** acetaminophen, albuterol, eletriptan, HYDROmorphone (DILAUDID) injection, HYDROmorphone, LORazepam, OLANZapine,  ondansetron **OR** ondansetron (ZOFRAN) IV, polyethylene glycol, sodium chloride flush, sodium phosphate  Current Outpatient Medications  Medication Instructions   acetaminophen (TYLENOL) 1,000 mg, Every 6 hours PRN   eletriptan (RELPAX) 40 mg, Oral, As needed, May repeat in 2 hours if needed.   HYDROmorphone (DILAUDID) 2 mg, Oral, Every 4 hours PRN   lidocaine-prilocaine (EMLA) cream 1 Application, Topical, As needed   LORazepam (ATIVAN) 0.5 mg, Daily PRN   magnesium oxide (MAG-OX) 400 (241.3 MG) MG tablet TAKE 1 TABLET BY MOUTH TWICE A DAY   morphine (MS CONTIN) 30 mg, Oral, Every 8 hours   morphine (MS CONTIN) 15 mg, Oral, Every 8 hours   OLANZapine (ZYPREXA) 10 MG tablet TAKE 1 TABLET BY MOUTH EVERYDAY AT BEDTIME   ondansetron (ZOFRAN) 8 mg, Oral, Every 8 hours PRN   ondansetron (ZOFRAN-ODT) 8 mg, Oral, Every 8 hours PRN   pantoprazole (PROTONIX) 40 mg, Daily PRN   polyethylene glycol (MIRALAX / GLYCOLAX) 17 g, Oral, 3 times daily   promethazine (PHENERGAN) 25 mg, Oral, Every 6 hours PRN   scopolamine (TRANSDERM-SCOP) 1.5 mg, Transdermal, every 72 hours   senna-docusate (SENOKOT-S) 8.6-50 MG tablet 2 tablets, Oral, 3 times daily   sodium chloride flush 0.9 % SOLN injection 5 mLs, Per Tube, As needed, Flush each nephrostomy tube with 5 mm as needed if noted decreased output.   Yuvafem 10 mcg, 2 times weekly    Diet Orders (From admission, onward)     Start     Ordered   04/24/23 0001  Diet NPO time specified Except for: Sips with Meds  Diet effective midnight       Comments:    Question:  Except for  AnswerClearance Coots with Meds   04/22/23 1055   04/22/23 0839  Diet regular Room service appropriate? Yes; Fluid consistency: Thin  Diet effective now       Question Answer Comment  Room service appropriate? Yes   Fluid consistency: Thin      04/22/23 0838            DVT prophylaxis: enoxaparin (LOVENOX) injection 40 mg Start: 04/25/23 1000   Lab Results  Component Value  Date   PLT 141 (L) 04/23/2023      Code Status: Full Code  Family Communication: No family at bedside  Status is: Inpatient Remains inpatient appropriate because: severity of illness  Level of care: Med-Surg  Consultants:  IR Palliative  Objective: Vitals:   04/23/23 0533 04/23/23 0751 04/23/23 0754 04/23/23 0935  BP: 114/72   124/61  Pulse: 67   79  Resp: 15 12 12 15   Temp: (!) 97.5 F (36.4 C)   98.2 F (36.8 C)  TempSrc: Oral   Oral  SpO2: 97% 95% 95% 96%  Weight:      Height:        Intake/Output Summary (Last 24 hours) at 04/23/2023 1058 Last data filed at 04/23/2023 0920 Gross per 24 hour  Intake 3483.52 ml  Output 1170 ml  Net 2313.52 ml   Wt Readings from Last 3 Encounters:  04/22/23 52.6 kg  04/14/23 52.6 kg  04/11/23 52.7 kg    Examination:  Constitutional: NAD Eyes: no scleral icterus ENMT: Mucous membranes are moist.  Neck: normal, supple Respiratory: clear to auscultation bilaterally, no wheezing, no crackles. Cardiovascular: Regular rate and rhythm, no murmurs / rubs / gallops.  Abdomen: non distended, no tenderness. Bowel sounds positive.  Musculoskeletal: no clubbing / cyanosis.    Data Reviewed: I have independently reviewed following labs and imaging studies   CBC Recent Labs  Lab 04/17/23 0323 04/18/23 0312 04/19/23 0242 04/22/23 0201 04/23/23 0235  WBC 5.9 6.6 5.8 10.5 10.7*  HGB 7.9* 8.3* 8.9* 10.0* 8.2*  HCT 27.1* 27.9* 29.2* 32.5* 27.5*  PLT 104* 138* 143* 141* 141*  MCV 94.4 93.0 90.7 90.8 91.7  MCH 27.5 27.7 27.6 27.9 27.3  MCHC 29.2* 29.7* 30.5 30.8 29.8*  RDW 17.0* 16.9* 16.9* 17.1* 17.3*  LYMPHSABS 0.9  --   --  0.9  --   MONOABS 1.0  --   --  1.5*  --   EOSABS 0.3  --   --  0.0  --   BASOSABS 0.0  --   --  0.0  --     Recent Labs  Lab 04/17/23 0323 04/18/23 0312 04/19/23 0242 04/22/23 0201 04/23/23 0235  NA 138 136 136 137 135  K 3.6 3.5 4.4 3.5 3.9  CL 105 102 105 102 105  CO2 25 24 25 26 25    GLUCOSE 106* 111* 182* 114* 130*  BUN 8 9 10 9 8   CREATININE 1.05* 0.87 0.85 0.62 0.43*  CALCIUM 8.3* 8.2* 8.4* 8.8* 8.3*  AST 14*  --   --  30  --   ALT 6  --   --  15  --   ALKPHOS 70  --   --  86  --   BILITOT 0.7  --   --  0.4  --   ALBUMIN 2.8*  --   --  3.6  --   MG  --  2.1  --   --   --   INR 1.3*  --   --   --  1.3*    ------------------------------------------------------------------------------------------------------------------ No results for input(s): "CHOL", "HDL", "LDLCALC", "TRIG", "CHOLHDL", "LDLDIRECT" in the last 72 hours.  No results found for: "HGBA1C" ------------------------------------------------------------------------------------------------------------------ No results for input(s): "TSH", "T4TOTAL", "T3FREE", "THYROIDAB" in the last 72 hours.  Invalid input(s): "FREET3"  Cardiac Enzymes No results for input(s): "CKMB", "TROPONINI", "MYOGLOBIN" in the last 168 hours.  Invalid input(s): "CK" ------------------------------------------------------------------------------------------------------------------ No results found for: "BNP"  CBG: No results for input(s): "GLUCAP" in the last 168 hours.  No results found for this or any previous visit (from the past 240 hours).   Radiology Studies: No results found.   Pamella Pert, MD, PhD [facility]  Between 7 am - 7 pm I am available, please contact me via Amion (for emergencies) or Securechat (non urgent messages)  Between 7 pm - 7 am I am not available, please contact night coverage MD/APP via Amion

## 2023-04-23 NOTE — Plan of Care (Signed)

## 2023-04-23 NOTE — Progress Notes (Signed)
 Daily Progress Note   Patient Name: Kaitlin Ferrell       Date: 04/23/2023 DOB: [birth year]  Age: 58 y.o. MRN#: [id number] Attending Physician: Leatha Gilding, MD Primary Care Physician: Bernadette Hoit, MD Admit Date: 04/22/2023 Length of Stay: 1 day  Reason for Consultation/Follow-up: Pain control  Subjective:   CC: Patient noting pain has improved with use of PCA.  Following up pain control.  Subjective:  Reviewed EMR prior to presenting to bedside.  At time of EMR review in past 24 hours patient has received as needed IV Dilaudid PCA bolus of 0.5 mg x 61 doses and RN bolus of as needed Dilaudid 1 mg x 1 dose. Review of BMP noted creatinine to be 0.43.  Review of CBC noted white blood cell count to be at 10.7, hemoglobin 8.2, and platelets 141.  Presented to bedside to see patient.  Patient laying more comfortably in bed today.  Patient still noticed to have grimacing at times with movement.  Patient called her husband to put him on speaker phone during conversation.  Discussed symptom management at this time.  Patient does feel her pain has improved with the use of PCA.  Patient notes pain is still not gone as it was before with the block.  Patient having worsening pain with movement.  Discussed can make slight adjustment to IV Dilaudid PCA bolus today to allow patient to be able to move around a little bit more freely without as much pain.  Still awaiting determination when IR can perform neurolysis since this got rid of patient's pain previously.  Patient has been inquiring when patient would have nephrostomy tubes removed.  Noted concerned about this would be that blockage that led to the nephrostomy is from patient's current cancer burden.  Patient has not get any cancer directed therapies to lessen this burden so will continue to grow and worsen.  Patient is awaiting further input from oncologist if she can be placed in a clinical trial.  Noted would inform hospitalist of  their concern if patient will continue to have bilateral nephrostomy tubes or if there is another procedure that can be performed so these can be removed.  Defer procedure assessment to urology.  Spent time answering questions as able.  Noted palliative medicine team continue to follow along with patient's medical journey.  Objective:   Vital Signs:  BP 124/61 (BP Location: Right Arm)   Pulse 79   Temp 98.2 F (36.8 C) (Oral)   Resp 15   Ht 5\' 2"  (1.575 m)   Wt 52.6 kg   LMP 06/26/2013 Comment: low dose BCP  SpO2 96%   BMI 21.22 kg/m   Physical Exam: General: NAD, alert, chronically ill-appearing Cardiovascular: RRR Respiratory: no increased work of breathing noted, not in respiratory distress Neuro: A&Ox4, following commands easily Psych: appropriately answers all questions  Imaging:  I personally reviewed recent imaging.   Assessment & Plan:   Assessment: Patient is a 58 year old female with a past medical history of metastatic pancreatic cancer, hypertension, GERD, and portal vein occlusion as well as right upper extremity DVT on Eliquis who was admitted on 04/22/2023 for worsening cancer pain.  Patient had recently been hospitalized and was just discharged on 04/19/2023 after receiving celiac plexus nerve block which had brought patient's pain to 0.  Patient returning to the hospital noting her pain has returned.  IR consulted to determine if celiac plexus neurolysis can be expedited.  Palliative medicine team consulted to assist with complex  medical decision making. Of note, patient recently seen by this palliative medicine provider during last hospitalization.  Recommendations/Plan: # Symptom management Patient is receiving these palliative interventions for symptom management with an intent to improve quality of life.                 -Severe acute on chronic pain in the setting of metastatic pancreatic cancer. Patient had celiac plexus nerve block performed on 04/18/2023  which had completely alleviated pain and patient was discharged on 04/19/2023.  Unfortunately nerve block wore off and patient is returning with the same pain. At time of EMR review in past 24 hours patient has received as needed IV Dilaudid PCA bolus of 0.5 mg x 61 doses and RN bolus of as needed Dilaudid 1 mg x 1 dose.                               -Increase IV Dilaudid PCA to 0.7 mg q. 10-minute as needed bolus.  No continuous infusion noted since receiving oral long-acting pain medications.  Continue to make adjustments based on patient's symptom burden.                               -Continue as needed IV Dilaudid to 1 mg every hour for breakthrough pain after PCA bolus                               -Continue MS Contin 60 mg every 8 hours during the day                               -Awaiting celiac plexus neurolysis since block had completely relieved patient's pain.   # Discharge Planning:  To Be Determined  Discussed with: Patient, patient's husband, RN, hospitalist  Thank you for allowing the palliative care team to participate in the care [facility].  Alvester Morin, DO Palliative Care Provider PMT # [phone]  If patient remains symptomatic despite maximum doses, please call PMT at [phone] between 0700 and 1900. Outside of these hours, please call attending, as PMT does not have night coverage.

## 2023-04-24 ENCOUNTER — Inpatient Hospital Stay (HOSPITAL_COMMUNITY)

## 2023-04-24 DIAGNOSIS — G4700 Insomnia, unspecified: Secondary | ICD-10-CM

## 2023-04-24 DIAGNOSIS — R109 Unspecified abdominal pain: Secondary | ICD-10-CM | POA: Diagnosis not present

## 2023-04-24 DIAGNOSIS — G893 Neoplasm related pain (acute) (chronic): Secondary | ICD-10-CM | POA: Diagnosis not present

## 2023-04-24 DIAGNOSIS — C259 Malignant neoplasm of pancreas, unspecified: Secondary | ICD-10-CM | POA: Diagnosis not present

## 2023-04-24 DIAGNOSIS — K5903 Drug induced constipation: Secondary | ICD-10-CM

## 2023-04-24 DIAGNOSIS — Z515 Encounter for palliative care: Secondary | ICD-10-CM | POA: Diagnosis not present

## 2023-04-24 DIAGNOSIS — Z79899 Other long term (current) drug therapy: Secondary | ICD-10-CM | POA: Diagnosis not present

## 2023-04-24 LAB — CBC WITH DIFFERENTIAL/PLATELET
Abs Immature Granulocytes: 0.07 10*3/uL (ref 0.00–0.07)
Basophils Absolute: 0 10*3/uL (ref 0.0–0.1)
Basophils Relative: 0 %
Eosinophils Absolute: 0 10*3/uL (ref 0.0–0.5)
Eosinophils Relative: 0 %
HCT: 27.5 % — ABNORMAL LOW (ref 36.0–46.0)
Hemoglobin: 8.1 g/dL — ABNORMAL LOW (ref 12.0–15.0)
Immature Granulocytes: 1 %
Lymphocytes Relative: 9 %
Lymphs Abs: 0.8 10*3/uL (ref 0.7–4.0)
MCH: 27.8 pg (ref 26.0–34.0)
MCHC: 29.5 g/dL — ABNORMAL LOW (ref 30.0–36.0)
MCV: 94.5 fL (ref 80.0–100.0)
Monocytes Absolute: 1.3 10*3/uL — ABNORMAL HIGH (ref 0.1–1.0)
Monocytes Relative: 13 %
Neutro Abs: 7.3 10*3/uL (ref 1.7–7.7)
Neutrophils Relative %: 77 %
Platelets: 116 10*3/uL — ABNORMAL LOW (ref 150–400)
RBC: 2.91 MIL/uL — ABNORMAL LOW (ref 3.87–5.11)
RDW: 17.5 % — ABNORMAL HIGH (ref 11.5–15.5)
WBC: 9.5 10*3/uL (ref 4.0–10.5)
nRBC: 0 % (ref 0.0–0.2)

## 2023-04-24 MED ORDER — BUPIVACAINE HCL (PF) 0.5 % IJ SOLN
INTRAMUSCULAR | Status: AC
  Filled 2023-04-24: qty 30

## 2023-04-24 MED ORDER — DIPHENHYDRAMINE HCL 50 MG/ML IJ SOLN
INTRAMUSCULAR | Status: AC
  Filled 2023-04-24: qty 1

## 2023-04-24 MED ORDER — FENTANYL CITRATE (PF) 100 MCG/2ML IJ SOLN
INTRAMUSCULAR | Status: AC
  Filled 2023-04-24: qty 2

## 2023-04-24 MED ORDER — DIPHENHYDRAMINE HCL 50 MG/ML IJ SOLN
INTRAMUSCULAR | Status: AC | PRN
  Administered 2023-04-24: 25 mg via INTRAVENOUS

## 2023-04-24 MED ORDER — OLANZAPINE 10 MG PO TABS
10.0000 mg | ORAL_TABLET | Freq: Every day | ORAL | Status: DC
  Administered 2023-04-24 – 2023-04-27 (×4): 10 mg via ORAL
  Filled 2023-04-24 (×4): qty 1

## 2023-04-24 MED ORDER — IOHEXOL 300 MG/ML  SOLN
2.0000 mL | Freq: Once | INTRAMUSCULAR | Status: AC | PRN
  Administered 2023-04-24: 2 mL

## 2023-04-24 MED ORDER — ALCOHOL (ABLYSINOL) 99% IA SOLN
INTRA_ARTERIAL | Status: AC
  Filled 2023-04-24: qty 10

## 2023-04-24 MED ORDER — HYDROMORPHONE HCL 1 MG/ML IJ SOLN
INTRAMUSCULAR | Status: AC
  Filled 2023-04-24: qty 2

## 2023-04-24 MED ORDER — MIDAZOLAM HCL 2 MG/2ML IJ SOLN
INTRAMUSCULAR | Status: AC | PRN
  Administered 2023-04-24 (×2): 1 mg via INTRAVENOUS

## 2023-04-24 MED ORDER — MIDAZOLAM HCL 2 MG/2ML IJ SOLN
INTRAMUSCULAR | Status: AC
  Filled 2023-04-24: qty 4

## 2023-04-24 MED ORDER — FENTANYL CITRATE (PF) 100 MCG/2ML IJ SOLN
INTRAMUSCULAR | Status: AC | PRN
  Administered 2023-04-24 (×2): 50 ug via INTRAVENOUS

## 2023-04-24 MED ORDER — CARMEX CLASSIC LIP BALM EX OINT
1.0000 | TOPICAL_OINTMENT | CUTANEOUS | Status: DC | PRN
  Filled 2023-04-24: qty 10

## 2023-04-24 MED ORDER — POLYETHYLENE GLYCOL 3350 17 G PO PACK
17.0000 g | PACK | Freq: Every day | ORAL | Status: DC
  Administered 2023-04-24 – 2023-04-28 (×5): 17 g via ORAL
  Filled 2023-04-24 (×6): qty 1

## 2023-04-24 NOTE — Procedures (Signed)
 Interventional Radiology Procedure Note  Procedure: CT guided ethanol neurolysis of the celiac plexus  Complications: None  Estimated Blood Loss: None  Recommendations: - Bedrest x 2 hrs - Resume diet - Pain control as needed - Watch for orthostatic hypotension   Signed,  Sterling Big, MD

## 2023-04-24 NOTE — Progress Notes (Signed)
 Daily Progress Note   Patient Name: Kaitlin Ferrell       Date: 04/24/2023 DOB: Jun 17, 1965  Age: 58 y.o. MRN#: [id number] Attending Physician: Leatha Gilding, MD Primary Care Physician: Bernadette Hoit, MD Admit Date: 04/22/2023 Length of Stay: 2 days  Reason for Consultation/Follow-up: Pain control  Subjective:   CC: Patient noting pain has improved with use of PCA.  Following up pain control.  Subjective:  Reviewed EMR prior to presenting to bedside.  At time of EMR review in past 24 hours patient has received IV Dilaudid via PCA bolus dosed at 0.7 mg x 56 boluses.  Patient continues to receive MS Contin 60 mg every 8 hours during the day. Noted patient had received as needed olanzapine last night.  Noted last bowel movement documented multiple days prior.  Presented to bedside to see patient.  Patient sitting up comfortably in bed.  No family at bedside.  Patient noted she is currently n.p.o. with hope that she can have celiac plexus neurolysis performed today to improve her pain.  Discussed if patient able to have neurolysis and receive the same "0 pain" she was able to obtain after the block, would then plan to discontinue PCA and put patient back on oral as needed short acting medication in addition to the long-acting MS Contin she is already receiving.  Patient agreeing with this plan.  Continuing PCA at this time awaiting neurolysis.  Discussed patient's other symptom management needs.  Noted had seen patient received as needed olanzapine.  Inquired how patient was taking this medication at home.  Patient noted she had taken it as needed though discussed overall benefits of medication if taking regularly including affecting appetite, mood, and sleep.  Patient agreeing with starting medication nightly scheduled.  Patient denies any adverse effects associated with medication. Also reviewed constipation at this time.  Patient already receiving senna 2 tabs 3 times daily.   Patient received 1 as needed dose of MiraLAX.  Patient noted that she usually needs aggressive bowel management to have bowel movement.  Patient notes that when she is taking medications like lactulose previously, this has not assisted her in having a bowel movement, only tasted bad.  Patient notes that she is best assisted by enema.  Discussed can perform soapsuds enema today to assist with constipation management.  Patient agreeing with this plan.  Also going to schedule on MiraLAX daily.  May need to further adjust bowel regimen as hope is patient has a bowel movement at least once every 3 days if not every day.  Patient acknowledged this plan.  All questions answered at that time.  Noted palliative medicine team continue to follow along with patient's medical chart.  Discussed care with IDT including hospitalist and RN to coordinate care after visit.  Objective:   Vital Signs:  BP (!) 150/80 (BP Location: Right Arm)   Pulse 74   Temp 98 F (36.7 C) (Oral)   Resp 16   Ht 5\' 2"  (1.575 m)   Wt 52.6 kg   LMP 06/26/2013 Comment: low dose BCP  SpO2 100%   BMI 21.22 kg/m   Physical Exam: General: NAD, alert, chronically ill-appearing Cardiovascular: RRR Respiratory: no increased work of breathing noted, not in respiratory distress Neuro: A&Ox4, following commands easily Psych: appropriately answers all questions  Imaging:  I personally reviewed recent imaging.   Assessment & Plan:   Assessment: Patient is a 58 year old female with a past medical history of metastatic pancreatic cancer, hypertension, GERD, and  portal vein occlusion as well as right upper extremity DVT on Eliquis who was admitted on 04/22/2023 for worsening cancer pain.  Patient had recently been hospitalized and was just discharged on 04/19/2023 after receiving celiac plexus nerve block which had brought patient's pain to 0.  Patient returning to the hospital noting her pain has returned.  IR consulted to determine if  celiac plexus neurolysis can be expedited.  Palliative medicine team consulted to assist with complex medical decision making. Of note, patient recently seen by this palliative medicine provider during last hospitalization.  Recommendations/Plan: # Symptom management Patient is receiving these palliative interventions for symptom management with an intent to improve quality of life.                 -Severe acute on chronic pain in the setting of metastatic pancreatic cancer. Patient had celiac plexus nerve block performed on 04/18/2023 which had completely alleviated pain and patient was discharged on 04/19/2023.  Unfortunately nerve block wore off and patient is returning with the same pain. At time of EMR review in past 24 hours patient has received as needed IV Dilaudid PCA bolus of 0.7 mg x 56 doses.                               -Continue IV Dilaudid PCA to 0.7 mg q. 10-minute as needed bolus.  No continuous infusion noted since receiving oral long-acting pain medications.  Plan for this to be discontinued once patient receives neurolysis as long as receiving pain management from it as she previously did the celiac plexus block.                               -Continue as needed IV Dilaudid to 1 mg every hour for breakthrough pain after PCA bolus                               -Continue MS Contin 60 mg every 8 hours during the day                               -Hoping for celiac plexus neurolysis today since block had completely relieved patient's pain.    -Insomnia/mood   -Change olanzapine to 10 mg nightly scheduled, not as needed    -Personally reviewed EKG from 04/22/2023 which noted QTc 438   -Constipation   -Continue senna 2 tab 3 times daily   -Change MiraLAX to 17 g daily   -Provide soapsuds enema today  # Discharge Planning:  To Be Determined  Discussed with: Patient, RN, hospitalist  Thank you for allowing the palliative care team to participate in the care [facility].  Alvester Morin, DO Palliative Care Provider PMT # [phone]  If patient remains symptomatic despite maximum doses, please call PMT at [phone] between 0700 and 1900. Outside of these hours, please call attending, as PMT does not have night coverage.

## 2023-04-24 NOTE — Plan of Care (Signed)
 Progress: Patient readmitted for pain. Patient had celiac plexus neurolysis done today by IR. Patient is currently resting. Patient has not had a BM since Tuesday. Gave senna and miralax today. Unable to give second senna and enema currently due to sedation will try later. Patients PCA may come down if nerve block works for pain.  Problem: Education: Goal: Knowledge of General Education information will improve Description: Including pain rating scale, medication(s)/side effects and non-pharmacologic comfort measures Outcome: Progressing   Problem: Health Behavior/Discharge Planning: Goal: Ability to manage health-related needs will improve Outcome: Progressing   Problem: Clinical Measurements: Goal: Ability to maintain clinical measurements within normal limits will improve Outcome: Progressing Goal: Will remain free from infection Outcome: Progressing Goal: Diagnostic test results will improve Outcome: Progressing Goal: Respiratory complications will improve Outcome: Progressing Goal: Cardiovascular complication will be avoided Outcome: Progressing   Problem: Activity: Goal: Risk for activity intolerance will decrease Outcome: Progressing   Problem: Nutrition: Goal: Adequate nutrition will be maintained Outcome: Progressing   Problem: Coping: Goal: Level of anxiety will decrease Outcome: Progressing   Problem: Elimination: Goal: Will not experience complications related to bowel motility Outcome: Progressing Goal: Will not experience complications related to urinary retention Outcome: Progressing   Problem: Pain Managment: Goal: General experience of comfort will improve and/or be controlled Outcome: Progressing   Problem: Safety: Goal: Ability to remain free from injury will improve Outcome: Progressing   Problem: Skin Integrity: Goal: Risk for impaired skin integrity will decrease Outcome: Progressing

## 2023-04-24 NOTE — Progress Notes (Signed)
 PROGRESS NOTE  Kaitlin Ferrell ZOX:[id number] DOB: Jun 14, 1965 DOA: 04/22/2023 PCP: Bernadette Hoit, MD   LOS: 2 days   Brief Narrative / Interim history: 58 year old female with metastatic pancreatic adenocarcinoma, not on treatment due to cancer progression, HTN, portal vein occlusion and right upper extremity DVT comes to the hospital with intractable abdominal and back pain.  She was recently admitted and discharged 3/12 after bilateral nephrostomy tube placement and celiac plexus block by IR, had significant relief but only for 2 days, and pain returned and she was admitted again.   Subjective / 24h Interval events: She is awaiting the procedure  Assesement and Plan: Principal Problem:   Abdominal pain Active Problems:   High risk medication use   Cancer related pain   Medication management   Malignant neoplasm of pancreas (HCC)   Principal problem Cancer related pain-palliative care consulted to assist with pain control, continue oral morphine as well as a PCA pump -IR consulted, plans are in place for celiac plexus neurolysis today, timing to be determined by IR group  Active problems History of DVT-was on Eliquis, discontinued due to thrombocytopenia but I am not clear when this was done, I messaged Dr. Leonides Schanz  Stage IV pancreatic adenocarcinoma -seeing Dr. Leonides Schanz as an outpatient, has metastatic involvement liver, unfortunately she is not on treatment due to progression of the disease -There are plans in place for her to be referred to [facility] for potentially clinical trials  Bilateral nephrostomy tubes -noted  Anemia, thrombocytopenia -related to malignancy  Scheduled Meds:  Chlorhexidine Gluconate Cloth  6 each Topical Daily   [START ON 04/25/2023] enoxaparin (LOVENOX) injection  40 mg Subcutaneous Q24H   HYDROmorphone   Intravenous Q4H   morphine  60 mg Oral 3 times per day   OLANZapine  10 mg Oral QHS   polyethylene glycol  17 g Oral Daily   senna-docusate   2 tablet Oral TID   Continuous Infusions:  sodium chloride 125 mL/hr at 04/24/23 0504   PRN Meds:.acetaminophen **OR** acetaminophen, albuterol, eletriptan, HYDROmorphone (DILAUDID) injection, HYDROmorphone, lip balm, LORazepam, ondansetron **OR** ondansetron (ZOFRAN) IV, sodium chloride flush  Current Outpatient Medications  Medication Instructions   acetaminophen (TYLENOL) 1,000 mg, Every 6 hours PRN   eletriptan (RELPAX) 40 mg, Oral, As needed, May repeat in 2 hours if needed.   HYDROmorphone (DILAUDID) 2 mg, Oral, Every 4 hours PRN   lidocaine-prilocaine (EMLA) cream 1 Application, Topical, As needed   LORazepam (ATIVAN) 0.5 mg, Daily PRN   magnesium oxide (MAG-OX) 400 (241.3 MG) MG tablet TAKE 1 TABLET BY MOUTH TWICE A DAY   morphine (MS CONTIN) 30 mg, Oral, Every 8 hours   morphine (MS CONTIN) 15 mg, Oral, Every 8 hours   OLANZapine (ZYPREXA) 10 MG tablet TAKE 1 TABLET BY MOUTH EVERYDAY AT BEDTIME   ondansetron (ZOFRAN) 8 mg, Oral, Every 8 hours PRN   ondansetron (ZOFRAN-ODT) 8 mg, Oral, Every 8 hours PRN   pantoprazole (PROTONIX) 40 mg, Daily PRN   polyethylene glycol (MIRALAX / GLYCOLAX) 17 g, Oral, 3 times daily   promethazine (PHENERGAN) 25 mg, Oral, Every 6 hours PRN   scopolamine (TRANSDERM-SCOP) 1.5 mg, Transdermal, every 72 hours   senna-docusate (SENOKOT-S) 8.6-50 MG tablet 2 tablets, Oral, 3 times daily   sodium chloride flush 0.9 % SOLN injection 5 mLs, Per Tube, As needed, Flush each nephrostomy tube with 5 mm as needed if noted decreased output.   Yuvafem 10 mcg, 2 times weekly    Diet Orders (From  admission, onward)     Start     Ordered   04/24/23 0001  Diet NPO time specified Except for: Sips with Meds  Diet effective midnight       Comments:    Question:  Except for  Answer:  Sips with Meds   04/22/23 1055            DVT prophylaxis: enoxaparin (LOVENOX) injection 40 mg Start: 04/25/23 1000   Lab Results  Component Value Date   PLT 116 (L)  04/24/2023      Code Status: Full Code  Family Communication: No family at bedside  Status is: Inpatient Remains inpatient appropriate because: severity of illness  Level of care: Med-Surg  Consultants:  IR Palliative  Objective: Vitals:   04/24/23 0349 04/24/23 0520 04/24/23 0817 04/24/23 1002  BP:  (!) 150/80  125/70  Pulse:  74  78  Resp: 13 16 19 14   Temp:  98 F (36.7 C)  98.7 F (37.1 C)  TempSrc:  Oral  Oral  SpO2: 98% 100% 98% 99%  Weight:      Height:        Intake/Output Summary (Last 24 hours) at 04/24/2023 1044 Last data filed at 04/24/2023 1000 Gross per 24 hour  Intake 3869.25 ml  Output 1270 ml  Net 2599.25 ml   Wt Readings from Last 3 Encounters:  04/22/23 52.6 kg  04/14/23 52.6 kg  04/11/23 52.7 kg    Examination: Constitutional: NAD Eyes: lids and conjunctivae normal, no scleral icterus ENMT: mmm Neck: normal, supple Respiratory: clear to auscultation bilaterally, no wheezing, no crackles. Normal respiratory effort.  Cardiovascular: Regular rate and rhythm, no murmurs / rubs / gallops. No LE edema. Abdomen: soft, no distention, no tenderness. Bowel sounds positive.    Data Reviewed: I have independently reviewed following labs and imaging studies   CBC Recent Labs  Lab 04/18/23 0312 04/19/23 0242 04/22/23 0201 04/23/23 0235 04/24/23 0205  WBC 6.6 5.8 10.5 10.7* 9.5  HGB 8.3* 8.9* 10.0* 8.2* 8.1*  HCT 27.9* 29.2* 32.5* 27.5* 27.5*  PLT 138* 143* 141* 141* 116*  MCV 93.0 90.7 90.8 91.7 94.5  MCH 27.7 27.6 27.9 27.3 27.8  MCHC 29.7* 30.5 30.8 29.8* 29.5*  RDW 16.9* 16.9* 17.1* 17.3* 17.5*  LYMPHSABS  --   --  0.9  --  0.8  MONOABS  --   --  1.5*  --  1.3*  EOSABS  --   --  0.0  --  0.0  BASOSABS  --   --  0.0  --  0.0    Recent Labs  Lab 04/18/23 0312 04/19/23 0242 04/22/23 0201 04/23/23 0235  NA 136 136 137 135  K 3.5 4.4 3.5 3.9  CL 102 105 102 105  CO2 24 25 26 25   GLUCOSE 111* 182* 114* 130*  BUN 9 10 9 8    CREATININE 0.87 0.85 0.62 0.43*  CALCIUM 8.2* 8.4* 8.8* 8.3*  AST  --   --  30  --   ALT  --   --  15  --   ALKPHOS  --   --  86  --   BILITOT  --   --  0.4  --   ALBUMIN  --   --  3.6  --   MG 2.1  --   --   --   INR  --   --   --  1.3*    ------------------------------------------------------------------------------------------------------------------ No results for input(s): "CHOL", "HDL", "  LDLCALC", "TRIG", "CHOLHDL", "LDLDIRECT" in the last 72 hours.  No results found for: "HGBA1C" ------------------------------------------------------------------------------------------------------------------ No results for input(s): "TSH", "T4TOTAL", "T3FREE", "THYROIDAB" in the last 72 hours.  Invalid input(s): "FREET3"  Cardiac Enzymes No results for input(s): "CKMB", "TROPONINI", "MYOGLOBIN" in the last 168 hours.  Invalid input(s): "CK" ------------------------------------------------------------------------------------------------------------------ No results found for: "BNP"  CBG: No results for input(s): "GLUCAP" in the last 168 hours.  No results found for this or any previous visit (from the past 240 hours).   Radiology Studies: No results found.   Pamella Pert, MD, PhD [facility]  Between 7 am - 7 pm I am available, please contact me via Amion (for emergencies) or Securechat (non urgent messages)  Between 7 pm - 7 am I am not available, please contact night coverage MD/APP via Amion

## 2023-04-24 NOTE — Plan of Care (Signed)
   Problem: Education: Goal: Knowledge of General Education information will improve Description Including pain rating scale, medication(s)/side effects and non-pharmacologic comfort measures Outcome: Progressing   Problem: Health Behavior/Discharge Planning: Goal: Ability to manage health-related needs will improve Outcome: Progressing

## 2023-04-24 NOTE — Progress Notes (Signed)
 MEDICATION-RELATED CONSULT NOTE   IR Procedure Consult - Anticoagulant/Antiplatelet PTA/Inpatient Med List Review by Pharmacist    Procedure:  CT guided ethanol neurolysis of the celiac plexus     Completed: 04/24/23  Post-Procedural bleeding risk per IR MD assessment:  standard  Antithrombotic medications on inpatient or PTA profile prior to procedure:     Lovenox 40mg /d  Recommended restart time per IR Post-Procedure Guidelines:  Day +1   Other considerations:      Plan:     Lovenox 40mg  SQ daily 04/25/23  Niva Murren S. Merilynn Finland, PharmD, BCPS Clinical Staff Pharmacist

## 2023-04-25 ENCOUNTER — Inpatient Hospital Stay

## 2023-04-25 ENCOUNTER — Inpatient Hospital Stay: Payer: BC Managed Care – PPO | Admitting: Hematology and Oncology

## 2023-04-25 ENCOUNTER — Inpatient Hospital Stay: Payer: BC Managed Care – PPO

## 2023-04-25 DIAGNOSIS — R109 Unspecified abdominal pain: Secondary | ICD-10-CM | POA: Diagnosis not present

## 2023-04-25 DIAGNOSIS — Z7189 Other specified counseling: Secondary | ICD-10-CM | POA: Diagnosis not present

## 2023-04-25 DIAGNOSIS — Z515 Encounter for palliative care: Secondary | ICD-10-CM | POA: Diagnosis not present

## 2023-04-25 DIAGNOSIS — C259 Malignant neoplasm of pancreas, unspecified: Secondary | ICD-10-CM | POA: Diagnosis not present

## 2023-04-25 LAB — COMPREHENSIVE METABOLIC PANEL
ALT: 11 U/L (ref 0–44)
AST: 17 U/L (ref 15–41)
Albumin: 2.5 g/dL — ABNORMAL LOW (ref 3.5–5.0)
Alkaline Phosphatase: 60 U/L (ref 38–126)
Anion gap: 7 (ref 5–15)
BUN: 8 mg/dL (ref 6–20)
CO2: 22 mmol/L (ref 22–32)
Calcium: 7.7 mg/dL — ABNORMAL LOW (ref 8.9–10.3)
Chloride: 110 mmol/L (ref 98–111)
Creatinine, Ser: 0.41 mg/dL — ABNORMAL LOW (ref 0.44–1.00)
GFR, Estimated: >60 mL/min (ref >60)
Glucose, Bld: 117 mg/dL — ABNORMAL HIGH (ref 70–99)
Potassium: 3.4 mmol/L — ABNORMAL LOW (ref 3.5–5.1)
Sodium: 139 mmol/L (ref 135–145)
Total Bilirubin: 0.6 mg/dL (ref 0.0–1.2)
Total Protein: 5.5 g/dL — ABNORMAL LOW (ref 6.5–8.1)

## 2023-04-25 LAB — MAGNESIUM: Magnesium: 1.7 mg/dL (ref 1.7–2.4)

## 2023-04-25 LAB — PHOSPHORUS: Phosphorus: 2.9 mg/dL (ref 2.5–4.6)

## 2023-04-25 MED ORDER — POTASSIUM CHLORIDE CRYS ER 20 MEQ PO TBCR
40.0000 meq | EXTENDED_RELEASE_TABLET | Freq: Once | ORAL | Status: AC
  Administered 2023-04-25: 40 meq via ORAL
  Filled 2023-04-25: qty 2

## 2023-04-25 MED ORDER — APIXABAN 5 MG PO TABS
5.0000 mg | ORAL_TABLET | Freq: Two times a day (BID) | ORAL | Status: DC
  Administered 2023-04-25 – 2023-04-26 (×2): 5 mg via ORAL
  Filled 2023-04-25 (×2): qty 1

## 2023-04-25 NOTE — Plan of Care (Signed)
   Problem: Education: Goal: Knowledge of General Education information will improve Description Including pain rating scale, medication(s)/side effects and non-pharmacologic comfort measures Outcome: Progressing   Problem: Health Behavior/Discharge Planning: Goal: Ability to manage health-related needs will improve Outcome: Progressing

## 2023-04-25 NOTE — Progress Notes (Signed)
 Dilaudid PCA syringe was replaced. There was no medication left in previous syringe, witnessed by Bishnu, RN.

## 2023-04-25 NOTE — Progress Notes (Signed)
 I met with Kaitlin Ferrell and her husband to offer emotional and spiritual support.  Provided prayer, at their request.  They have good support from family and expressed no other needs at this time.

## 2023-04-25 NOTE — Progress Notes (Signed)
   04/25/23 1142  TOC Brief Assessment  Insurance and Status Reviewed  Patient has primary care physician Yes  Home environment has been reviewed home with spouse  Prior level of function: independent  Prior/Current Home Services No current home services  Social Drivers of Health Review SDOH reviewed no interventions necessary  Readmission risk has been reviewed Yes  Transition of care needs no transition of care needs at this time

## 2023-04-25 NOTE — Progress Notes (Signed)
 Referring Provider(s): Maryln Gottron  Supervising Physician: Marliss Coots  Patient Status:  [facility] - In-pt  Chief Complaint:  progressive metastatic pancreatic adenocarcinoma, intractable abdomina/pelvic/back pain - s/p celiac plexus block on 3/11 with Dr. Milford Cage; S/p celiac plexus neurolysis 3/17 with Dr. Archer Asa   Subjective:  Pt states she is feeling much better today than yesterday with lots of improvement of pain since procedure.  Pt is without complaints.  All pt questions answered.    Allergies: Compazine [prochlorperazine edisylate], Propofol, Oxycodone-acetaminophen, Prochlorperazine, and Labetalol  Medications: Prior to Admission medications   Medication Sig Start Date End Date Taking? Authorizing Provider  acetaminophen (TYLENOL) 500 MG tablet Take 1,000 mg by mouth every 6 (six) hours as needed for mild pain (pain score 1-3) or headache.   Yes [provider]  eletriptan (RELPAX) 40 MG tablet Take 1 tablet (40 mg total) by mouth as needed for migraine or headache. May repeat in 2 hours if needed. Patient taking differently: Take 40 mg by mouth as needed for migraine or headache (and may repeat in 2 hours, if no relief). 09/22/14  Yes Nilda Riggs, NP  HYDROmorphone (DILAUDID) 2 MG tablet Take 1 tablet (2 mg total) by mouth every 4 (four) hours as needed for severe pain (pain score 7-10). 04/10/23  Yes Pickenpack-Cousar, Arty Baumgartner, NP  lidocaine-prilocaine (EMLA) cream Apply 1 Application topically as needed. Patient taking differently: Apply 1 Application topically as needed (for port access). 09/29/22  Yes Pickenpack-Cousar, Arty Baumgartner, NP  LORazepam (ATIVAN) 0.5 MG tablet Take 0.5 mg by mouth daily as needed for anxiety. 03/22/22  Yes [provider]  morphine (MS CONTIN) 15 MG 12 hr tablet Take 1 tablet (15 mg total) by mouth every 8 (eight) hours. Patient taking differently: Take 30 mg by mouth See admin instructions. Take 30 mg by mouth at 6  AM, 2 PM, and 10 PM in conjunction with ONE 30 mg tablet to equal a total dose of 60 mg 04/13/23  Yes Pickenpack-Cousar, Athena N, NP  morphine (MS CONTIN) 30 MG 12 hr tablet Take 1 tablet (30 mg total) by mouth every 8 (eight) hours. Patient taking differently: Take 30 mg by mouth See admin instructions. Take 30 mg by mouth at 6 AM, 2 PM, and 10 PM in conjunction with TWO 15 mg tablets to equal a total dose of 60 mg 04/13/23  Yes Pickenpack-Cousar, Athena N, NP  OLANZapine (ZYPREXA) 10 MG tablet TAKE 1 TABLET BY MOUTH EVERYDAY AT BEDTIME Patient taking differently: Take 10 mg by mouth daily as needed. 04/20/22  Yes Georga Kaufmann T, PA-C  ondansetron (ZOFRAN) 8 MG tablet Take 1 tablet (8 mg total) by mouth every 8 (eight) hours as needed for nausea or vomiting. 03/14/22  Yes Georga Kaufmann T, PA-C  ondansetron (ZOFRAN-ODT) 8 MG disintegrating tablet Take 1 tablet (8 mg total) by mouth every 8 (eight) hours as needed for nausea or vomiting. Patient taking differently: Take 8 mg by mouth every 8 (eight) hours as needed for nausea or vomiting (dissolve orally). 05/17/22  Yes Walisiewicz, Kaitlyn E, PA-C  pantoprazole (PROTONIX) 40 MG tablet Take 40 mg by mouth daily as needed (for GERD-like symptoms). 03/17/23  Yes [provider]  polyethylene glycol (MIRALAX / GLYCOLAX) 17 g packet Take 17 g by mouth 3 (three) times daily. 04/19/23 07/18/23 Yes [place], Eric J, DO  promethazine (PHENERGAN) 25 MG tablet Take 1 tablet (25 mg total) by mouth every 6 (six) hours as needed for nausea  or vomiting. 05/04/22  Yes Jaci Standard, MD  scopolamine (TRANSDERM-SCOP) 1 MG/3DAYS Place 1 patch (1.5 mg total) onto the skin every 3 (three) days. 07/22/22  Yes Georga Kaufmann T, PA-C  senna-docusate (SENOKOT-S) 8.6-50 MG tablet Take 2 tablets by mouth 3 (three) times daily. 04/19/23 07/18/23 Yes [place], Eric J, DO  YUVAFEM 10 MCG TABS vaginal tablet Place 10 mcg vaginally 2 (two) times a week. 03/03/23  Yes [provider]  magnesium oxide (MAG-OX) 400 (241.3 MG) MG tablet TAKE 1 TABLET BY MOUTH TWICE A DAY Patient not taking: Reported on 04/22/2023 11/16/14   Nilda Riggs, NP  sodium chloride flush 0.9 % SOLN injection Place 5 mLs into feeding tube as needed. Flush each nephrostomy tube with 5 mm as needed if noted decreased output. 04/19/23   [place], Alvira Philips, DO     Vital Signs: BP 131/85 (BP Location: Left Arm)   Pulse 91   Temp 98.3 F (36.8 C) (Oral)   Resp 16   Ht 5\' 2"  (1.575 m)   Wt 116 lb (52.6 kg)   LMP 06/26/2013 Comment: low dose BCP  SpO2 100%   BMI 21.22 kg/m   Physical Exam Constitutional:      Appearance: She is well-developed.  HENT:     Head: Atraumatic.     Mouth/Throat:     Mouth: Mucous membranes are moist.  Cardiovascular:     Rate and Rhythm: Normal rate and regular rhythm.     Heart sounds: No murmur heard. Pulmonary:     Effort: Pulmonary effort is normal.     Breath sounds: Normal breath sounds.  Abdominal:     General: Bowel sounds are normal.     Palpations: Abdomen is soft.  Musculoskeletal:        General: Normal range of motion.  Skin:    General: Skin is warm.  Neurological:     Mental Status: She is alert and oriented to person, place, and time.  Psychiatric:        Mood and Affect: Mood normal.        Behavior: Behavior normal.      Labs:  CBC: Recent Labs    04/19/23 0242 04/22/23 0201 04/23/23 0235 04/24/23 0205  WBC 5.8 10.5 10.7* 9.5  HGB 8.9* 10.0* 8.2* 8.1*  HCT 29.2* 32.5* 27.5* 27.5*  PLT 143* 141* 141* 116*    COAGS: Recent Labs    04/17/23 0323 04/23/23 0235  INR 1.3* 1.3*    BMP: Recent Labs    04/19/23 0242 04/22/23 0201 04/23/23 0235 04/25/23 0254  NA 136 137 135 139  K 4.4 3.5 3.9 3.4*  CL 105 102 105 110  CO2 25 26 25 22   GLUCOSE 182* 114* 130* 117*  BUN 10 9 8 8   CALCIUM 8.4* 8.8* 8.3* 7.7*  CREATININE 0.85 0.62 0.43* 0.41*  GFRNONAA >60 >60 >60 >60    LIVER FUNCTION TESTS: Recent Labs     04/16/23 0321 04/17/23 0323 04/22/23 0201 04/25/23 0254  BILITOT 0.7 0.7 0.4 0.6  AST 13* 14* 30 17  ALT 8 6 15 11   ALKPHOS 62 70 86 60  PROT 6.3* 6.1* 7.2 5.5*  ALBUMIN 2.9* 2.8* 3.6 2.5*    Assessment and Plan:  Pt with progressive metastatic pancreatic adenocarcinoma and intractable abdomina/pelvic/back pain s/p celiac plexus block on 3/11 with Dr. Milford Cage and s/p celiac plexus neurolysis 3/17 with Dr. Archer Asa.    Pt states she is feeling much better  today, she up and moving around the room.  No soreness at entry site.  All pt questions answered.    Electronically Signed: Loman Brooklyn, PA-C 04/25/2023, 10:52 AM    I spent a total of 15 Minutes at the the patient's bedside AND on the patient's hospital floor or unit, greater than 50% of which was counseling/coordinating care for image guided celiac plexus neurolysis.

## 2023-04-25 NOTE — Progress Notes (Signed)
 Daily Progress Note   Patient Name: Kaitlin Ferrell       Date: 04/25/2023 DOB: [birth year]  Age: 58 y.o. MRN#: [id number] Attending Physician: Leatha Gilding, MD Primary Care Physician: Bernadette Hoit, MD Admit Date: 04/22/2023 Length of Stay: 3 days  Reason for Consultation/Follow-up: Pain control  Subjective:   CC: Patient noting pain has improved with use of PCA.  Following up pain control.  Subjective:  Reviewed EMR prior to presenting to bedside.      Patient continues to receive MS Contin 60 mg every 8 hours during the day. She got enema and had bowel movement earlier this am.   Presented to bedside to see patient.  Patient sitting up comfortably in bed.  No family at bedside.  Husband on the phone, discussed about pain management.   Patient wishes to not discontinue PCA for the next 24 hours, she would like to see about being put back on oral as needed short acting medication in am on 04-26-23, she is still on long-acting MS Contin she is already receiving.  Patient agreeing with this plan.  Continuing PCA at this time    Discussed about bowel regimen.    All questions answered at that time.  Noted palliative medicine team continue to follow along with patient's medical chart.  Discussed care with IDT including hospitalist    Objective:   Vital Signs:  BP 131/85 (BP Location: Left Arm)   Pulse 91   Temp 98.3 F (36.8 C) (Oral)   Resp 13   Ht 5\' 2"  (1.575 m)   Wt 52.6 kg   LMP 06/26/2013 Comment: low dose BCP  SpO2 100%   BMI 21.22 kg/m   Physical Exam: General: NAD, alert, chronically ill-appearing Cardiovascular: RRR Respiratory: no increased work of breathing noted, not in respiratory distress Neuro: A&Ox4, following commands easily Psych: appropriately answers all questions  Imaging:  I personally reviewed recent imaging.   Assessment & Plan:   Assessment: Patient is a 58 year old female with a past medical history of metastatic  pancreatic cancer, hypertension, GERD, and portal vein occlusion as well as right upper extremity DVT on Eliquis who was admitted on 04/22/2023 for worsening cancer pain.  Patient had recently been hospitalized and was just discharged on 04/19/2023 after receiving celiac plexus nerve block which had brought patient's pain to 0.  Patient returning to the hospital noting her pain has returned.  IR consulted to determine if celiac plexus neurolysis can be expedited.  Palliative medicine team consulted to assist with complex medical decision making. Of note, patient recently seen by this palliative medicine provider during last hospitalization.  Recommendations/Plan: # Symptom management Patient is receiving these palliative interventions for symptom management with an intent to improve quality of life.                 -Severe acute on chronic pain in the setting of metastatic pancreatic cancer. Patient had celiac plexus nerve block performed on 04/18/2023 which had completely alleviated pain and patient was discharged on 04/19/2023.  Unfortunately nerve block wore off and patient is returning with the same pain. At time of EMR review in past 24 hours patient has received as needed IV Dilaudid PCA bolus of 0.7 mg x 56 doses.                               -Continue IV Dilaudid PCA to 0.7 mg q. 10-minute as needed bolus.  No continuous infusion noted since receiving oral long-acting pain medications.  Plan for this to be discontinued within the next 24 hours once patient receives neurolysis as long as receiving pain management from it as she previously did the celiac plexus block.                               -Continue as needed IV Dilaudid to 1 mg every hour for breakthrough pain after PCA bolus                               -Continue MS Contin 60 mg every 8 hours during the day                                    -Insomnia/mood   -Change olanzapine to 10 mg nightly scheduled, not as needed    -Personally  reviewed EKG from 04/22/2023 which noted QTc 438   -Constipation   -Continue senna 2 tab 3 times daily   -Change MiraLAX to 17 g daily   -Provide soapsuds enema today  # Discharge Planning:  To Be Determined  Discussed with: Patient, husband on phone, hospitalist  Thank you for allowing the palliative care team to participate in the care [facility].  Mod MDM Rosalin Hawking MD.  Palliative Care Provider PMT # [phone]  If patient remains symptomatic despite maximum doses, please call PMT at [phone] between 0700 and 1900. Outside of these hours, please call attending, as PMT does not have night coverage.

## 2023-04-25 NOTE — Progress Notes (Signed)
 PROGRESS NOTE  Kaitlin Ferrell NUU:[id number] DOB: Jun 12, 1965 DOA: 04/22/2023 PCP: Kaitlin Hoit, MD   LOS: 3 days   Brief Narrative / Interim history: 58 year old female with metastatic pancreatic adenocarcinoma, not on treatment due to cancer progression, HTN, portal vein occlusion and right upper extremity DVT comes to the hospital with intractable abdominal and back pain.  She was recently admitted and discharged 3/12 after bilateral nephrostomy tube placement and celiac plexus block by IR, had significant relief but only for 2 days, and pain returned and she was admitted again.   Subjective / 24h Interval events: Feeling better, underwent CT-guided ethanol neurolysis of the celiac plexus yesterday and feels like pain is much better.  Assesement and Plan: Principal Problem:   Abdominal pain Active Problems:   High risk medication use   Cancer related pain   Medication management   Malignant neoplasm of pancreas (HCC)   Principal problem Cancer related pain-palliative care consulted to assist with pain control, continue oral morphine as well as a PCA pump, defer to palliative team timing for discontinuation but will need to give a trial off the PCA pump today in preparation for potential home discharge in the next 1 to 2 days -IR consulted, she is status post CT-guided ethanol neurolysis of the celiac plexus yesterday  Active problems History of DVT-was on Eliquis, discontinued during her prior hospitalization and in the setting of prior thrombocytopenia.  Discussed with Dr. Leonides Schanz, now that the platelets are above 50 and she does not have any procedures planned, now could be resumed.  Start back Eliquis today  Stage IV pancreatic adenocarcinoma -seeing Dr. Leonides Schanz as an outpatient, has metastatic involvement liver, unfortunately she is not on treatment due to progression of the disease -There are plans in place for her to be referred to [facility] for potentially clinical  trials, to be done as an outpatient  Bilateral nephrostomy tubes -noted  Anemia, thrombocytopenia -related to malignancy  Scheduled Meds:  Chlorhexidine Gluconate Cloth  6 each Topical Daily   enoxaparin (LOVENOX) injection  40 mg Subcutaneous Q24H   HYDROmorphone   Intravenous Q4H   morphine  60 mg Oral 3 times per day   OLANZapine  10 mg Oral QHS   polyethylene glycol  17 g Oral Daily   senna-docusate  2 tablet Oral TID   Continuous Infusions:  sodium chloride 125 mL/hr at 04/25/23 0432   PRN Meds:.acetaminophen **OR** acetaminophen, albuterol, eletriptan, HYDROmorphone (DILAUDID) injection, HYDROmorphone, lip balm, LORazepam, ondansetron **OR** ondansetron (ZOFRAN) IV, sodium chloride flush  Current Outpatient Medications  Medication Instructions   acetaminophen (TYLENOL) 1,000 mg, Every 6 hours PRN   eletriptan (RELPAX) 40 mg, Oral, As needed, May repeat in 2 hours if needed.   HYDROmorphone (DILAUDID) 2 mg, Oral, Every 4 hours PRN   lidocaine-prilocaine (EMLA) cream 1 Application, Topical, As needed   LORazepam (ATIVAN) 0.5 mg, Daily PRN   magnesium oxide (MAG-OX) 400 (241.3 MG) MG tablet TAKE 1 TABLET BY MOUTH TWICE A DAY   morphine (MS CONTIN) 30 mg, Oral, Every 8 hours   morphine (MS CONTIN) 15 mg, Oral, Every 8 hours   OLANZapine (ZYPREXA) 10 MG tablet TAKE 1 TABLET BY MOUTH EVERYDAY AT BEDTIME   ondansetron (ZOFRAN) 8 mg, Oral, Every 8 hours PRN   ondansetron (ZOFRAN-ODT) 8 mg, Oral, Every 8 hours PRN   pantoprazole (PROTONIX) 40 mg, Daily PRN   polyethylene glycol (MIRALAX / GLYCOLAX) 17 g, Oral, 3 times daily   promethazine (PHENERGAN) 25 mg, Oral, Every  6 hours PRN   scopolamine (TRANSDERM-SCOP) 1.5 mg, Transdermal, every 72 hours   senna-docusate (SENOKOT-S) 8.6-50 MG tablet 2 tablets, Oral, 3 times daily   sodium chloride flush 0.9 % SOLN injection 5 mLs, Per Tube, As needed, Flush each nephrostomy tube with 5 mm as needed if noted decreased output.   Yuvafem 10  mcg, 2 times weekly    Diet Orders (From admission, onward)     Start     Ordered   04/24/23 1915  Diet regular Room service appropriate? Yes; Fluid consistency: Thin  Diet effective now       Question Answer Comment  Room service appropriate? Yes   Fluid consistency: Thin      04/24/23 1914            DVT prophylaxis: enoxaparin (LOVENOX) injection 40 mg Start: 04/25/23 1000   Lab Results  Component Value Date   PLT 116 (L) 04/24/2023      Code Status: Full Code  Family Communication: No family at bedside  Status is: Inpatient Remains inpatient appropriate because: severity of illness  Level of care: Med-Surg  Consultants:  IR Palliative  Objective: Vitals:   04/25/23 0431 04/25/23 0629 04/25/23 0749 04/25/23 1028  BP:  138/73  131/85  Pulse:  87  91  Resp: 15 16 14 16   Temp:  98.4 F (36.9 C)  98.3 F (36.8 C)  TempSrc:  Oral  Oral  SpO2: 95% 99% 98% 100%  Weight:      Height:        Intake/Output Summary (Last 24 hours) at 04/25/2023 1038 Last data filed at 04/25/2023 1000 Gross per 24 hour  Intake 3107.26 ml  Output 2000 ml  Net 1107.26 ml   Wt Readings from Last 3 Encounters:  04/22/23 52.6 kg  04/14/23 52.6 kg  04/11/23 52.7 kg    Examination: Constitutional: NAD Eyes: lids and conjunctivae normal, no scleral icterus ENMT: mmm Neck: normal, supple Respiratory: clear to auscultation bilaterally, no wheezing, no crackles. Normal respiratory effort.  Cardiovascular: Regular rate and rhythm, no murmurs / rubs / gallops. No LE edema. Abdomen: soft, no distention, no tenderness. Bowel sounds positive.    Data Reviewed: I have independently reviewed following labs and imaging studies   CBC Recent Labs  Lab 04/19/23 0242 04/22/23 0201 04/23/23 0235 04/24/23 0205  WBC 5.8 10.5 10.7* 9.5  HGB 8.9* 10.0* 8.2* 8.1*  HCT 29.2* 32.5* 27.5* 27.5*  PLT 143* 141* 141* 116*  MCV 90.7 90.8 91.7 94.5  MCH 27.6 27.9 27.3 27.8  MCHC 30.5 30.8  29.8* 29.5*  RDW 16.9* 17.1* 17.3* 17.5*  LYMPHSABS  --  0.9  --  0.8  MONOABS  --  1.5*  --  1.3*  EOSABS  --  0.0  --  0.0  BASOSABS  --  0.0  --  0.0    Recent Labs  Lab 04/19/23 0242 04/22/23 0201 04/23/23 0235 04/25/23 0254  NA 136 137 135 139  K 4.4 3.5 3.9 3.4*  CL 105 102 105 110  CO2 25 26 25 22   GLUCOSE 182* 114* 130* 117*  BUN 10 9 8 8   CREATININE 0.85 0.62 0.43* 0.41*  CALCIUM 8.4* 8.8* 8.3* 7.7*  AST  --  30  --  17  ALT  --  15  --  11  ALKPHOS  --  86  --  60  BILITOT  --  0.4  --  0.6  ALBUMIN  --  3.6  --  2.5*  MG  --   --   --  1.7  INR  --   --  1.3*  --     ------------------------------------------------------------------------------------------------------------------ No results for input(s): "CHOL", "HDL", "LDLCALC", "TRIG", "CHOLHDL", "LDLDIRECT" in the last 72 hours.  No results found for: "HGBA1C" ------------------------------------------------------------------------------------------------------------------ No results for input(s): "TSH", "T4TOTAL", "T3FREE", "THYROIDAB" in the last 72 hours.  Invalid input(s): "FREET3"  Cardiac Enzymes No results for input(s): "CKMB", "TROPONINI", "MYOGLOBIN" in the last 168 hours.  Invalid input(s): "CK" ------------------------------------------------------------------------------------------------------------------ No results found for: "BNP"  CBG: No results for input(s): "GLUCAP" in the last 168 hours.  No results found for this or any previous visit (from the past 240 hours).   Radiology Studies: CT CELIAC PLEXUS BLOCK NEUROLYTIC Result Date: 04/24/2023 INDICATION: 58 year old female with pancreatic cancer severe intractable cancer pain. She presents for celiac plexus neuro lysis EXAM: CT-guided celiac plexus neuro lysis with 100% alcohol COMPARISON:  Prior CT scan of the abdomen and pelvis 04/14/2023 MEDICATIONS: None ANESTHESIA/SEDATION: A total of 2 mg Versed, 100 mcg fentanyl and 25 mg  Benadryl were administered intravenously by the Radiology nurse. The patient's level of consciousness and vital signs were monitored continuously by the radiology nurse under my direct supervision. A total monitored sedation time of 18 minutes was obtained. FLUOROSCOPY: None. COMPLICATIONS: None immediate. TECHNIQUE: Informed written consent was obtained from the patient after a thorough discussion of the procedural risks, benefits and alternatives. All questions were addressed. Maximal Sterile Barrier Technique was utilized including caps, mask, sterile gowns, sterile gloves, sterile drape, hand hygiene and skin antiseptic. A timeout was performed prior to the initiation of the procedure. A planning axial CT scan was performed. A small fleck of calcium just cephalad to the origin of the celiac artery was identified and used as a target. A suitable skin entry site was selected and marked. After cleaning and prepping in the standard fashion using chlorhexidine skin prep. Local anesthesia was attained by infiltration with 1% lidocaine. A 22 gauge spinal needle was then carefully advanced to the desired target just outside of the aorta and superior to the origin of the celiac axis. A small amount of 0.5% Sensorcaine mixed with Omnipaque 300 was injected. The contrast tracks around the aorta in the retroperitoneal space in the desired location. Therefore, a total of 5 mL of 0.5% Sensorcaine was slowly injected. Several minutes were given to allow for full local anesthesia. A total of 15 mL dehydrated alcohol was next injected. Intermittent CT imaging was performed confirming expected placement of the injectate which surrounds the celiac artery in the region the celiac plexus. The needle was removed.  The patient tolerated the procedure well. FINDINGS: Technically successful CT-guided celiac plexus neuro lysis. IMPRESSION: Technically successful CT-guided celiac plexus neuro lysis. Electronically Signed   By: Malachy Moan M.D.   On: 04/24/2023 17:33     Pamella Pert, MD, PhD [facility]  Between 7 am - 7 pm I am available, please contact me via Amion (for emergencies) or Securechat (non urgent messages)  Between 7 pm - 7 am I am not available, please contact night coverage MD/APP via Amion

## 2023-04-25 NOTE — Plan of Care (Signed)
 Progress note: Patient did well today used PCA less will come off of PCA tomorrow and transition back to oral dilaudid average pain score - 5. Patient has been managing nephrotomies by her self doing well. Enema today - may need another tomorrow. Patient still distended.  Problem: Education: Goal: Knowledge of General Education information will improve Description: Including pain rating scale, medication(s)/side effects and non-pharmacologic comfort measures Outcome: Progressing   Problem: Health Behavior/Discharge Planning: Goal: Ability to manage health-related needs will improve Outcome: Progressing   Problem: Clinical Measurements: Goal: Ability to maintain clinical measurements within normal limits will improve Outcome: Progressing Goal: Will remain free from infection Outcome: Progressing Goal: Diagnostic test results will improve Outcome: Progressing Goal: Respiratory complications will improve Outcome: Progressing Goal: Cardiovascular complication will be avoided Outcome: Progressing   Problem: Activity: Goal: Risk for activity intolerance will decrease Outcome: Progressing   Problem: Nutrition: Goal: Adequate nutrition will be maintained Outcome: Progressing   Problem: Coping: Goal: Level of anxiety will decrease Outcome: Progressing   Problem: Elimination: Goal: Will not experience complications related to bowel motility Outcome: Progressing Goal: Will not experience complications related to urinary retention Outcome: Progressing   Problem: Pain Managment: Goal: General experience of comfort will improve and/or be controlled Outcome: Progressing   Problem: Safety: Goal: Ability to remain free from injury will improve Outcome: Progressing   Problem: Skin Integrity: Goal: Risk for impaired skin integrity will decrease Outcome: Progressing

## 2023-04-25 NOTE — Progress Notes (Signed)
 PHARMACY - ANTICOAGULATION CONSULT NOTE  Pharmacy Consult for Eliquis Indication:  history of VTE  Allergies  Allergen Reactions   Compazine [Prochlorperazine Edisylate]     Very agitated   Propofol Other (See Comments)    Headache   Oxycodone-Acetaminophen Itching and Other (See Comments)    Welts, too   Prochlorperazine    Labetalol Hives, Itching, Swelling and Rash    Patient Measurements: Height: 5\' 2"  (157.5 cm) Weight: 52.6 kg (116 lb) IBW/kg (Calculated) : 50.1  Vital Signs: Temp: 98.3 F (36.8 C) (03/18 1028) Temp Source: Oral (03/18 1028) BP: 131/85 (03/18 1028) Pulse Rate: 91 (03/18 1028)  Labs: Recent Labs    04/23/23 0235 04/24/23 0205 04/25/23 0254  HGB 8.2* 8.1*  --   HCT 27.5* 27.5*  --   PLT 141* 116*  --   LABPROT 16.0*  --   --   INR 1.3*  --   --   CREATININE 0.43*  --  0.41*    Estimated Creatinine Clearance: 62.1 mL/min (A) (by C-G formula based on SCr of 0.41 mg/dL (L)).   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 Previously on Eliquis for DVT, held due to thrombocytopenia. Plt improved and Oncology okay to restart Eliquis.  Plan:  -Resume Eliquis 5 mg BID -Monitor CBC as clinically indicated  Pricilla Riffle, PharmD, BCPS Clinical Pharmacist 04/25/2023 1:21 PM

## 2023-04-26 ENCOUNTER — Inpatient Hospital Stay (HOSPITAL_COMMUNITY)

## 2023-04-26 ENCOUNTER — Other Ambulatory Visit: Payer: Self-pay | Admitting: *Deleted

## 2023-04-26 DIAGNOSIS — Z7189 Other specified counseling: Secondary | ICD-10-CM

## 2023-04-26 DIAGNOSIS — R609 Edema, unspecified: Secondary | ICD-10-CM

## 2023-04-26 DIAGNOSIS — C259 Malignant neoplasm of pancreas, unspecified: Secondary | ICD-10-CM

## 2023-04-26 DIAGNOSIS — Z515 Encounter for palliative care: Secondary | ICD-10-CM | POA: Diagnosis not present

## 2023-04-26 LAB — CBC
HCT: 29.4 % — ABNORMAL LOW (ref 36.0–46.0)
Hemoglobin: 8.9 g/dL — ABNORMAL LOW (ref 12.0–15.0)
MCH: 27.9 pg (ref 26.0–34.0)
MCHC: 30.3 g/dL (ref 30.0–36.0)
MCV: 92.2 fL (ref 80.0–100.0)
Platelets: 129 10*3/uL — ABNORMAL LOW (ref 150–400)
RBC: 3.19 MIL/uL — ABNORMAL LOW (ref 3.87–5.11)
RDW: 17.8 % — ABNORMAL HIGH (ref 11.5–15.5)
WBC: 10.4 10*3/uL (ref 4.0–10.5)
nRBC: 0 % (ref 0.0–0.2)

## 2023-04-26 MED ORDER — APIXABAN 5 MG PO TABS
5.0000 mg | ORAL_TABLET | ORAL | Status: AC
  Administered 2023-04-26: 5 mg via ORAL
  Filled 2023-04-26: qty 1

## 2023-04-26 MED ORDER — HYDROMORPHONE HCL 2 MG PO TABS
2.0000 mg | ORAL_TABLET | ORAL | Status: DC | PRN
Start: 2023-04-26 — End: 2023-04-27

## 2023-04-26 MED ORDER — HYDROMORPHONE HCL 1 MG/ML IJ SOLN
1.0000 mg | INTRAMUSCULAR | Status: DC | PRN
  Administered 2023-04-26: 1 mg via INTRAVENOUS
  Filled 2023-04-26: qty 1

## 2023-04-26 MED ORDER — APIXABAN 5 MG PO TABS
10.0000 mg | ORAL_TABLET | Freq: Two times a day (BID) | ORAL | Status: DC
  Administered 2023-04-26 – 2023-04-28 (×4): 10 mg via ORAL
  Filled 2023-04-26 (×4): qty 2

## 2023-04-26 MED ORDER — HYDROMORPHONE HCL 2 MG PO TABS
2.0000 mg | ORAL_TABLET | ORAL | Status: DC | PRN
  Administered 2023-04-26: 4 mg via ORAL
  Filled 2023-04-26: qty 2

## 2023-04-26 MED ORDER — HYDROMORPHONE HCL 1 MG/ML IJ SOLN
1.0000 mg | Freq: Once | INTRAMUSCULAR | Status: AC
  Administered 2023-04-26: 1 mg via INTRAVENOUS
  Filled 2023-04-26: qty 1

## 2023-04-26 MED ORDER — HYDROMORPHONE HCL 1 MG/ML IJ SOLN
1.0000 mg | INTRAMUSCULAR | Status: DC | PRN
  Administered 2023-04-27 (×3): 2 mg via INTRAVENOUS
  Filled 2023-04-26 (×4): qty 2

## 2023-04-26 MED ORDER — APIXABAN 5 MG PO TABS
5.0000 mg | ORAL_TABLET | Freq: Two times a day (BID) | ORAL | Status: DC
Start: 2023-05-03 — End: 2023-04-28

## 2023-04-26 NOTE — Progress Notes (Signed)
 Daily Progress Note   Patient Name: Kaitlin Ferrell       Date: 04/26/2023 DOB: Sep 06, 1965  Age: 58 y.o. MRN#: [id number] Attending Physician: Alba Cory, MD Primary Care Physician: Bernadette Hoit, MD Admit Date: 04/22/2023 Length of Stay: 4 days  Reason for Consultation/Follow-up: Pain control  Subjective:   CC: Patient noting pain has improved with use of PCA.  Following up pain control.  Subjective:  Reviewed EMR prior to presenting to bedside.      Patient continues to receive MS Contin 60 mg every 8 hours during the day. She is having regular bowel movement    Presented to bedside to see patient.  Patient sitting up comfortably in bed.  No family at bedside.  Husband on the phone, discussed about pain management.   The we discussed about discontinuing PCA and continuing long-acting MS Contin and resuming oral hydromorphone.  Patient will still have some rescue IV hydromorphone to be used on an as-needed basis for breakthrough pain.    Objective:   Vital Signs:  BP (!) 150/69 (BP Location: Right Arm)   Pulse 96   Temp 98.7 F (37.1 C) (Oral)   Resp 18   Ht 5\' 2"  (1.575 m)   Wt 52.6 kg   LMP 06/26/2013 Comment: low dose BCP  SpO2 98%   BMI 21.22 kg/m   Physical Exam: General: NAD, alert, chronically ill-appearing Cardiovascular: RRR Respiratory: no increased work of breathing noted, not in respiratory distress Neuro: A&Ox4, following commands easily Psych: appropriately answers all questions  Imaging:  I personally reviewed recent imaging.   Assessment & Plan:   Assessment: Patient is a 58 year old female with a past medical history of metastatic pancreatic cancer, hypertension, GERD, and portal vein occlusion as well as right upper extremity DVT on Eliquis who was admitted on 04/22/2023 for worsening cancer pain.  Patient had recently been hospitalized and was just discharged on 04/19/2023 after receiving celiac plexus nerve block which had  brought patient's pain to 0.  Patient returning to the hospital noting her pain has returned.  IR consulted to determine if celiac plexus neurolysis can be expedited.  Palliative medicine team consulted to assist with complex medical decision making. Of note, patient recently seen by this palliative medicine provider during last hospitalization.  Recommendations/Plan: # Symptom management Patient is receiving these palliative interventions for symptom management with an intent to improve quality of life.                 -Severe acute on chronic pain in the setting of metastatic pancreatic cancer. Patient had celiac plexus nerve block performed on 04/18/2023 which had completely alleviated pain and patient was discharged on 04/19/2023.  Unfortunately nerve block wore off and patient is returning with the same pain. At time of EMR review in past 24 hours patient has received as needed IV Dilaudid PCA bolus of 0.7 mg x 56 doses.                               -Discontinue Dilaudid IV PCA, still has some rescue IV Dilaudid to be used on an as-needed basis, resume oral Dilaudid.  Discussed with the patient about her opioid regimen.  Remains on MS Contin 60 mg every 8 hours during the day.     -Insomnia/mood     olanzapine to 10 mg nightly scheduled,     -Personally reviewed EKG from 04/22/2023 which noted QTc 438   -  Constipation   -Continue senna 2 tab 3 times daily   -Change MiraLAX to 17 g daily   -Provide soapsuds enema today  # Discharge Planning:  To Be Determined  Discussed with: Patient, husband on phone, Thank you for allowing the palliative care team to participate in the care [facility].  Mod MDM Rosalin Hawking MD.  Palliative Care Provider PMT # [phone]  If patient remains symptomatic despite maximum doses, please call PMT at [phone] between 0700 and 1900. Outside of these hours, please call attending, as PMT does not have night coverage.

## 2023-04-26 NOTE — Progress Notes (Signed)
 PROGRESS NOTE    Kaitlin Ferrell  NWG:[id number] DOB: [birth year] DOA: 04/22/2023 PCP: Bernadette Hoit, MD   Brief Narrative: 58 year old with past medical history significant for metastatic pancreatic adenocarcinoma, not on treatment due to cancer progression, hypertension, portal vein occlusion, right upper extremity DVT presents with intractable abdominal and back pain.  Recently discharged 3 8/12 after bilateral nephrostomy tube placement and celiac plexus block by IR, had significantly relief but only for 2 days and abdominal pain returned and she was admitted for pain management.   Assessment & Plan:   Principal Problem:   Abdominal pain Active Problems:   Palliative care by specialist   High risk medication use   Cancer related pain   Goals of care, counseling/discussion   Malignant neoplasm of pancreas (HCC)   1-Cancer related pain: -Palliative care consulted for pain management. -Patient was started on PCA pump, pain has been better controlled after CT-guided ethanol neurolysis of the celiac plexus 3/17 -Palliative planning on changing PCA pump to long-acting morphine -Patient report pain is better  Acute DVT involving the right external iliac vein, common femoral vein SF junction and femoral vein -History of DVT: -Patient Eliquis was held due to thrombocytopenia, resume on 3/18. -Patient reports bilateral lower extremity edema worse on the left. -BL  lower extremity Doppler: Show right acute DVT involving the right external iliac vein, common femoral vein, SF junction and femoral vein. -Eliquis will be changed to therapeutic dose -Will also repeat platelet count today  Stage IV pancreatic adenocarcinoma: -Follows Dr. Leonides Schanz as an outpatient, has metastatic involvement liver, unfortunately she is not on treatment due to progression of the disease  -There are plans in place for her to be referred to [facility] for potentially clinical trials, to be done as an  outpatient   Bilateral nephrostomy tube: Noted.   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   Hypokalemia; replace     Estimated body mass index is 21.22 kg/m as calculated from the following:   Height as of this encounter: 5\' 2"  (1.575 m).   Weight as of this encounter: 52.6 kg.   DVT prophylaxis: Eliquis Code Status: Full code Family Communication: Discussed with patient Disposition Plan:  Status is: Inpatient Remains inpatient appropriate because: Management of pain related to malignancy    Consultants:  Palliative care Procedures:  Plexus block   Antimicrobials:    Subjective: Report back pain and abdominal pain has improved.  She has been eating okay.  She noticed bilateral leg swelling worse on the right leg.  Objective: Vitals:   04/26/23 0124 04/26/23 0542 04/26/23 0608 04/26/23 0612  BP:   121/61   Pulse:   87   Resp: 12 14 18 11   Temp:   98.5 F (36.9 C)   TempSrc:   Oral   SpO2:   100%   Weight:      Height:        Intake/Output Summary (Last 24 hours) at 04/26/2023 0750 Last data filed at 04/26/2023 5784 Gross per 24 hour  Intake 3437.72 ml  Output 1550 ml  Net 1887.72 ml   Filed Weights   04/22/23 0152  Weight: 52.6 kg    Examination:  General exam: Appears calm and comfortable  Respiratory system: Clear to auscultation. Respiratory effort normal. Cardiovascular system: S1 & S2 heard, RRR. No JVD, murmurs, rubs, gallops or clicks. No pedal edema. Gastrointestinal system: Abdomen is nondistended, soft and nontender. No organomegaly or masses felt. Normal bowel sounds heard. Central nervous system: Alert  and oriented. No focal neurological deficits. Extremities: Symmetric 5 x 5 power.   Data Reviewed: I have personally reviewed following labs and imaging studies  CBC: Recent Labs  Lab 04/22/23 0201 04/23/23 0235 04/24/23 0205  WBC 10.5 10.7* 9.5  NEUTROABS 8.0*  --  7.3  HGB 10.0* 8.2* 8.1*  HCT 32.5* 27.5*  27.5*  MCV 90.8 91.7 94.5  PLT 141* 141* 116*   Basic Metabolic Panel: Recent Labs  Lab 04/22/23 0201 04/23/23 0235 04/25/23 0254  NA 137 135 139  K 3.5 3.9 3.4*  CL 102 105 110  CO2 26 25 22   GLUCOSE 114* 130* 117*  BUN 9 8 8   CREATININE 0.62 0.43* 0.41*  CALCIUM 8.8* 8.3* 7.7*  MG  --   --  1.7  PHOS  --   --  2.9   GFR: Estimated Creatinine Clearance: 62.1 mL/min (A) (by C-G formula based on SCr of 0.41 mg/dL (L)). Liver Function Tests: Recent Labs  Lab 04/22/23 0201 04/25/23 0254  AST 30 17  ALT 15 11  ALKPHOS 86 60  BILITOT 0.4 0.6  PROT 7.2 5.5*  ALBUMIN 3.6 2.5*   No results for input(s): "LIPASE", "AMYLASE" in the last 168 hours. No results for input(s): "AMMONIA" in the last 168 hours. Coagulation Profile: Recent Labs  Lab 04/23/23 0235  INR 1.3*   Cardiac Enzymes: No results for input(s): "CKTOTAL", "CKMB", "CKMBINDEX", "TROPONINI" in the last 168 hours. BNP (last 3 results) No results for input(s): "PROBNP" in the last 8760 hours. HbA1C: No results for input(s): "HGBA1C" in the last 72 hours. CBG: No results for input(s): "GLUCAP" in the last 168 hours. Lipid Profile: No results for input(s): "CHOL", "HDL", "LDLCALC", "TRIG", "CHOLHDL", "LDLDIRECT" in the last 72 hours. Thyroid Function Tests: No results for input(s): "TSH", "T4TOTAL", "FREET4", "T3FREE", "THYROIDAB" in the last 72 hours. Anemia Panel: No results for input(s): "VITAMINB12", "FOLATE", "FERRITIN", "TIBC", "IRON", "RETICCTPCT" in the last 72 hours. Sepsis Labs: No results for input(s): "PROCALCITON", "LATICACIDVEN" in the last 168 hours.  No results found for this or any previous visit (from the past 240 hours).       Radiology Studies: CT CELIAC PLEXUS BLOCK NEUROLYTIC Result Date: 04/24/2023 INDICATION: 58 year old female with pancreatic cancer severe intractable cancer pain. She presents for celiac plexus neuro lysis EXAM: CT-guided celiac plexus neuro lysis with 100%  alcohol COMPARISON:  Prior CT scan of the abdomen and pelvis 04/14/2023 MEDICATIONS: None ANESTHESIA/SEDATION: A total of 2 mg Versed, 100 mcg fentanyl and 25 mg Benadryl were administered intravenously by the Radiology nurse. The patient's level of consciousness and vital signs were monitored continuously by the radiology nurse under my direct supervision. A total monitored sedation time of 18 minutes was obtained. FLUOROSCOPY: None. COMPLICATIONS: None immediate. TECHNIQUE: Informed written consent was obtained from the patient after a thorough discussion of the procedural risks, benefits and alternatives. All questions were addressed. Maximal Sterile Barrier Technique was utilized including caps, mask, sterile gowns, sterile gloves, sterile drape, hand hygiene and skin antiseptic. A timeout was performed prior to the initiation of the procedure. A planning axial CT scan was performed. A small fleck of calcium just cephalad to the origin of the celiac artery was identified and used as a target. A suitable skin entry site was selected and marked. After cleaning and prepping in the standard fashion using chlorhexidine skin prep. Local anesthesia was attained by infiltration with 1% lidocaine. A 22 gauge spinal needle was then carefully advanced to the desired target  just outside of the aorta and superior to the origin of the celiac axis. A small amount of 0.5% Sensorcaine mixed with Omnipaque 300 was injected. The contrast tracks around the aorta in the retroperitoneal space in the desired location. Therefore, a total of 5 mL of 0.5% Sensorcaine was slowly injected. Several minutes were given to allow for full local anesthesia. A total of 15 mL dehydrated alcohol was next injected. Intermittent CT imaging was performed confirming expected placement of the injectate which surrounds the celiac artery in the region the celiac plexus. The needle was removed.  The patient tolerated the procedure well. FINDINGS:  Technically successful CT-guided celiac plexus neuro lysis. IMPRESSION: Technically successful CT-guided celiac plexus neuro lysis. Electronically Signed   By: Malachy Moan M.D.   On: 04/24/2023 17:33        Scheduled Meds:  apixaban  5 mg Oral BID   Chlorhexidine Gluconate Cloth  6 each Topical Daily   HYDROmorphone   Intravenous Q4H   morphine  60 mg Oral 3 times per day   OLANZapine  10 mg Oral QHS   polyethylene glycol  17 g Oral Daily   senna-docusate  2 tablet Oral TID   Continuous Infusions:  sodium chloride 125 mL/hr at 04/26/23 0542     LOS: 4 days    Time spent: 35 minutes    Zhamir Pirro A Nella Botsford, MD [facility]   If 7PM-7AM, please contact night-coverage [email or web address]  04/26/2023, 7:50 AM

## 2023-04-26 NOTE — Progress Notes (Signed)
 PHARMACY - ANTICOAGULATION CONSULT NOTE  Pharmacy Consult for Eliquis Indication:  history of VTE ; now with acute DVT  Allergies  Allergen Reactions   Compazine [Prochlorperazine Edisylate]     Very agitated   Propofol Other (See Comments)    Headache   Oxycodone-Acetaminophen Itching and Other (See Comments)    Welts, too   Prochlorperazine    Labetalol Hives, Itching, Swelling and Rash    Patient Measurements: Height: 5\' 2"  (157.5 cm) Weight: 52.6 kg (116 lb) IBW/kg (Calculated) : 50.1  Vital Signs: Temp: 98.7 F (37.1 C) (03/19 1347) Temp Source: Oral (03/19 1347) BP: 150/69 (03/19 1347) Pulse Rate: 96 (03/19 1347)  Labs: Recent Labs    04/24/23 0205 04/25/23 0254  HGB 8.1*  --   HCT 27.5*  --   PLT 116*  --   CREATININE  --  0.41*    Estimated Creatinine Clearance: 62.1 mL/min (A) (by C-G formula based on SCr of 0.41 mg/dL (L)).   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 Previously on Eliquis for DVT, held due to thrombocytopenia. Plt improved and Oncology okay to restart Eliquis. Resumed 3/18 at 5 mg BID. On 3/19, ultrasound lower extremities positive for acute DVT involving right external iliac vein, common femoral vein, SF junction and femoral vein.  Pharmacy consulted to change Eliquis to treatment dose.  Plan:  -Give Eliquis 5 mg x 1 NOW to complete 10 mg dose for the morning -Tonight, Eliquis 10 mg BID x 13 doses followed by 5 mg BID -Monitor CBC as clinically indicated  Pricilla Riffle, PharmD, BCPS Clinical Pharmacist 04/26/2023 2:09  PM

## 2023-04-26 NOTE — Plan of Care (Signed)

## 2023-04-26 NOTE — Progress Notes (Signed)
 Bilateral lower extremity venous duplex has been completed. Preliminary results can be found in CV Proc through chart review.  Results were given to Dr. Sunnie Nielsen.  04/26/23 1:44 PM Olen Cordial RVT

## 2023-04-27 DIAGNOSIS — Z7189 Other specified counseling: Secondary | ICD-10-CM | POA: Diagnosis not present

## 2023-04-27 DIAGNOSIS — C259 Malignant neoplasm of pancreas, unspecified: Secondary | ICD-10-CM | POA: Diagnosis not present

## 2023-04-27 DIAGNOSIS — Z515 Encounter for palliative care: Secondary | ICD-10-CM | POA: Diagnosis not present

## 2023-04-27 LAB — BASIC METABOLIC PANEL
Anion gap: 5 (ref 5–15)
BUN: <5 mg/dL — ABNORMAL LOW (ref 6–20)
CO2: 25 mmol/L (ref 22–32)
Calcium: 8.1 mg/dL — ABNORMAL LOW (ref 8.9–10.3)
Chloride: 108 mmol/L (ref 98–111)
Creatinine, Ser: 0.49 mg/dL (ref 0.44–1.00)
GFR, Estimated: >60 mL/min (ref >60)
Glucose, Bld: 122 mg/dL — ABNORMAL HIGH (ref 70–99)
Potassium: 3.6 mmol/L (ref 3.5–5.1)
Sodium: 138 mmol/L (ref 135–145)

## 2023-04-27 LAB — CBC
HCT: 26 % — ABNORMAL LOW (ref 36.0–46.0)
Hemoglobin: 7.6 g/dL — ABNORMAL LOW (ref 12.0–15.0)
MCH: 27.5 pg (ref 26.0–34.0)
MCHC: 29.2 g/dL — ABNORMAL LOW (ref 30.0–36.0)
MCV: 94.2 fL (ref 80.0–100.0)
Platelets: 105 10*3/uL — ABNORMAL LOW (ref 150–400)
RBC: 2.76 MIL/uL — ABNORMAL LOW (ref 3.87–5.11)
RDW: 17.9 % — ABNORMAL HIGH (ref 11.5–15.5)
WBC: 8.2 10*3/uL (ref 4.0–10.5)
nRBC: 0 % (ref 0.0–0.2)

## 2023-04-27 MED ORDER — HYDROMORPHONE HCL 2 MG PO TABS
4.0000 mg | ORAL_TABLET | ORAL | Status: DC | PRN
  Administered 2023-04-28: 4 mg via ORAL
  Filled 2023-04-27: qty 2

## 2023-04-27 NOTE — Progress Notes (Signed)
 Daily Progress Note   Patient Name: Kaitlin Ferrell       Date: 04/27/2023 DOB: 09-Jul-1965  Age: 58 y.o. MRN#: [id number] Attending Physician: Alba Cory, MD Primary Care Physician: Bernadette Hoit, MD Admit Date: 04/22/2023 Length of Stay: 5 days  Reason for Consultation/Follow-up: Pain control  Subjective:   CC:  Following up pain control.  Subjective:  Reviewed EMR prior to presenting to bedside.      Patient continues to receive MS Contin 60 mg every 8 hours during the day. She is having regular bowel movement    Presented to bedside to see patient.  Patient sitting up comfortably in bed.  No family at bedside.  Husband on the phone, discussed about pain management.  Bedside RN also in the room.  Overnight events noted.  Patient states that she had acute uncontrolled pain in the beginning of the night.  We discussed her opioid regimen.  Explained about using rescue IV hydromorphone as a last resort, explained about using higher doses of oral hydromorphone 4-6 mg on an as-needed basis, patient continues on long-acting MS Contin 60 mg every 8 hours.   Objective:   Vital Signs:  BP 130/80 (BP Location: Right Arm)   Pulse 100   Temp 99 F (37.2 C) (Oral)   Resp 18   Ht 5\' 2"  (1.575 m)   Wt 52.6 kg   LMP 06/26/2013 Comment: low dose BCP  SpO2 96%   BMI 21.22 kg/m   Physical Exam: General: NAD, alert, chronically ill-appearing Cardiovascular: RRR Respiratory: no increased work of breathing noted, not in respiratory distress Neuro: A&Ox4, following commands easily Psych: appropriately answers all questions  Imaging:  I personally reviewed recent imaging.   Assessment & Plan:   Assessment: Patient is a 58 year old female with a past medical history of metastatic pancreatic cancer, hypertension, GERD, and portal vein occlusion as well as right upper extremity DVT on Eliquis who was admitted on 04/22/2023 for worsening cancer pain.  Patient had recently  been hospitalized and was just discharged on 04/19/2023 after receiving celiac plexus nerve block which had brought patient's pain to 0.  Patient returning to the hospital noting her pain has returned.  IR consulted to determine if celiac plexus neurolysis can be expedited.  Palliative medicine team consulted to assist with complex medical decision making. Of note, patient recently seen by this palliative medicine provider during last hospitalization.  Recommendations/Plan: # Symptom management Patient is receiving these palliative interventions for symptom management with an intent to improve quality of life.                 -Severe acute on chronic pain in the setting of metastatic pancreatic cancer. Patient had celiac plexus nerve block performed on 04/18/2023 which had completely alleviated pain and patient was discharged on 04/19/2023.  Unfortunately nerve block wore off and patient is returning with the same pain. still has some rescue IV Dilaudid to be used on an as-needed basis, increase the dose of oral Dilaudid-4 to 6 mg on an as-needed basis..  Discussed with the patient about her opioid regimen.  Remains on MS Contin 60 mg every 8 hours during the day.     -Insomnia/mood     olanzapine to 10 mg nightly scheduled,     -Personally reviewed EKG from 04/22/2023 which noted QTc 438   -Constipation   -Continue senna 2 tab 3 times daily   -Change MiraLAX to 17 g daily   -Continue current bowel regimen.  Constipation resolved. # Discharge Planning:  To Be Determined Extensive discussions with the patient as well as husband being present on the phone about patient trying to work on her opioid regimen by primarily using oral hydromorphone.  Patient's husband asks why she cannot take IV hydromorphone if there both " the same thing".  Explained about p.o. versus IV hydromorphone mechanism of action and half-life to the best of my ability.  Monitor patient's pain regimen and assess for discharge.   Palliative will continue to follow. Discussed with: Patient, husband on phone, Thank you for allowing the palliative care team to participate in the care [facility].  Mod MDM Rosalin Hawking MD.  Palliative Care Provider PMT # [phone]  If patient remains symptomatic despite maximum doses, please call PMT at [phone] between 0700 and 1900. Outside of these hours, please call attending, as PMT does not have night coverage.

## 2023-04-27 NOTE — Progress Notes (Signed)
 PROGRESS NOTE    Kaitlin Ferrell  MWU:[id number] DOB: [birth year] DOA: 04/22/2023 PCP: Bernadette Hoit, MD   Brief Narrative: 58 year old with past medical history significant for metastatic pancreatic adenocarcinoma, not on treatment due to cancer progression, hypertension, portal vein occlusion, right upper extremity DVT presents with intractable abdominal and back pain.  Recently discharged 3 8/12 after bilateral nephrostomy tube placement and celiac plexus block by IR, had significantly relief but only for 2 days and abdominal pain returned and she was admitted for pain management.   Assessment & Plan:   Principal Problem:   Abdominal pain Active Problems:   Palliative care by specialist   High risk medication use   Cancer related pain   Goals of care, counseling/discussion   Malignant neoplasm of pancreas (HCC)   1-Cancer related pain: -Palliative care consulted for pain management. -Patient was started on PCA pump, pain has been better controlled after CT-guided ethanol neurolysis of the celiac plexus 3/17 -Continue with long-acting morphine. Off PCA  -Patient report pain is better, plan to try oral rescued medications.   Acute DVT involving the right external iliac vein, common femoral vein SF junction and femoral vein -History of DVT: -Patient Eliquis was held due to thrombocytopenia, resume on 3/18. -Patient reports bilateral lower extremity edema worse on the left. -BL  lower extremity Doppler: Show right acute DVT involving the right external iliac vein, common femoral vein, SF junction and femoral vein. -Eliquis will be changed to therapeutic dose Continue to monitor platelet count  Stage IV pancreatic adenocarcinoma: -Follows Dr. Leonides Schanz as an outpatient, has metastatic involvement liver, unfortunately she is not on treatment due to progression of the disease  -There are plans in place for her to be referred to [facility] for potentially clinical trials, to be done  as an outpatient   Bilateral nephrostomy tube: Noted.   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   Hypokalemia; replaced     Estimated body mass index is 21.22 kg/m as calculated from the following:   Height as of this encounter: 5\' 2"  (1.575 m).   Weight as of this encounter: 52.6 kg.   DVT prophylaxis: Eliquis Code Status: Full code Family Communication: Discussed with patient Disposition Plan:  Status is: Inpatient Remains inpatient appropriate because: Management of pain related to malignancy    Consultants:  Palliative care Procedures:  Plexus block   Antimicrobials:    Subjective: Report pain is better, she did received IV dilaudid yesterday.  Plan to see how she does with oral dilaudid.   Objective: Vitals:   04/26/23 1347 04/26/23 2053 04/27/23 0618 04/27/23 1419  BP: (!) 150/69 (!) 144/67 130/80 (!) 149/86  Pulse: 96 (!) 107 100 80  Resp: 18 18 18 18   Temp: 98.7 F (37.1 C) 98.5 F (36.9 C) 99 F (37.2 C) 98.3 F (36.8 C)  TempSrc: Oral Oral Oral Oral  SpO2: 98% 100% 96% 100%  Weight:      Height:        Intake/Output Summary (Last 24 hours) at 04/27/2023 1427 Last data filed at 04/27/2023 1400 Gross per 24 hour  Intake 2604.62 ml  Output 2010 ml  Net 594.62 ml   Filed Weights   04/22/23 0152  Weight: 52.6 kg    Examination:  General exam: NAD Respiratory system: CTA Cardiovascular system: SS 1, S 2  RRR Gastrointestinal system: BS present, soft, nt Central nervous system: alert Extremities: no edema   Data Reviewed: I have personally reviewed following labs and imaging studies  CBC: Recent Labs  Lab 04/22/23 0201 04/23/23 0235 04/24/23 0205 04/26/23 1944 04/27/23 0306  WBC 10.5 10.7* 9.5 10.4 8.2  NEUTROABS 8.0*  --  7.3  --   --   HGB 10.0* 8.2* 8.1* 8.9* 7.6*  HCT 32.5* 27.5* 27.5* 29.4* 26.0*  MCV 90.8 91.7 94.5 92.2 94.2  PLT 141* 141* 116* 129* 105*   Basic Metabolic Panel: Recent Labs  Lab  04/22/23 0201 04/23/23 0235 04/25/23 0254 04/27/23 0306  NA 137 135 139 138  K 3.5 3.9 3.4* 3.6  CL 102 105 110 108  CO2 26 25 22 25   GLUCOSE 114* 130* 117* 122*  BUN 9 8 8  <5*  CREATININE 0.62 0.43* 0.41* 0.49  CALCIUM 8.8* 8.3* 7.7* 8.1*  MG  --   --  1.7  --   PHOS  --   --  2.9  --    GFR: Estimated Creatinine Clearance: 62.1 mL/min (by C-G formula based on SCr of 0.49 mg/dL). Liver Function Tests: Recent Labs  Lab 04/22/23 0201 04/25/23 0254  AST 30 17  ALT 15 11  ALKPHOS 86 60  BILITOT 0.4 0.6  PROT 7.2 5.5*  ALBUMIN 3.6 2.5*   No results for input(s): "LIPASE", "AMYLASE" in the last 168 hours. No results for input(s): "AMMONIA" in the last 168 hours. Coagulation Profile: Recent Labs  Lab 04/23/23 0235  INR 1.3*   Cardiac Enzymes: No results for input(s): "CKTOTAL", "CKMB", "CKMBINDEX", "TROPONINI" in the last 168 hours. BNP (last 3 results) No results for input(s): "PROBNP" in the last 8760 hours. HbA1C: No results for input(s): "HGBA1C" in the last 72 hours. CBG: No results for input(s): "GLUCAP" in the last 168 hours. Lipid Profile: No results for input(s): "CHOL", "HDL", "LDLCALC", "TRIG", "CHOLHDL", "LDLDIRECT" in the last 72 hours. Thyroid Function Tests: No results for input(s): "TSH", "T4TOTAL", "FREET4", "T3FREE", "THYROIDAB" in the last 72 hours. Anemia Panel: No results for input(s): "VITAMINB12", "FOLATE", "FERRITIN", "TIBC", "IRON", "RETICCTPCT" in the last 72 hours. Sepsis Labs: No results for input(s): "PROCALCITON", "LATICACIDVEN" in the last 168 hours.  No results found for this or any previous visit (from the past 240 hours).       Radiology Studies: VAS [place] LOWER EXTREMITY VENOUS (DVT) Result Date: 04/27/2023  Lower Venous DVT Study Patient Name:  Kaitlin Ferrell Kaitlin Ferrell  Date of Exam:   04/26/2023 Medical Rec #: [id number]                 Accession #:    [id number] Date of Birth: Jul 17, 1965                 Patient Gender: F Patient  Age:   56 years Exam Location:  [facility] Procedure:      VAS [place] LOWER EXTREMITY VENOUS (DVT) Referring Phys: Kaitlin Ferrell --------------------------------------------------------------------------------  Indications: Edema.  Risk Factors: Cancer. Limitations: Poor ultrasound/tissue interface. Comparison Study: No prior studies. Performing Technologist: Chanda Busing RVT  Examination Guidelines: A complete evaluation includes B-mode imaging, spectral Doppler, color Doppler, and power Doppler as needed of all accessible portions of each vessel. Bilateral testing is considered an integral part of a complete examination. Limited examinations for reoccurring indications may be performed as noted. The reflux portion of the exam is performed with the patient in reverse Trendelenburg.  +---------+---------------+---------+-----------+----------+--------------+ RIGHT    CompressibilityPhasicitySpontaneityPropertiesThrombus Aging +---------+---------------+---------+-----------+----------+--------------+ CFV      Partial        No       No  Acute          +---------+---------------+---------+-----------+----------+--------------+ SFJ      None           No       No                   Acute          +---------+---------------+---------+-----------+----------+--------------+ FV Prox  Partial        No       No                   Acute          +---------+---------------+---------+-----------+----------+--------------+ FV Mid   Full                                                        +---------+---------------+---------+-----------+----------+--------------+ FV DistalFull                                                        +---------+---------------+---------+-----------+----------+--------------+ PFV      Full           No       No                                  +---------+---------------+---------+-----------+----------+--------------+  POP      Full           Yes      Yes                                 +---------+---------------+---------+-----------+----------+--------------+ PTV      Full                                                        +---------+---------------+---------+-----------+----------+--------------+ PERO     Full                                                        +---------+---------------+---------+-----------+----------+--------------+ EIV      Partial                                                     +---------+---------------+---------+-----------+----------+--------------+ CIV                                                   Not visualized +---------+---------------+---------+-----------+----------+--------------+   +---------+---------------+---------+-----------+----------+--------------+ LEFT     CompressibilityPhasicitySpontaneityPropertiesThrombus Aging +---------+---------------+---------+-----------+----------+--------------+ CFV  Full           Yes      Yes                                 +---------+---------------+---------+-----------+----------+--------------+ SFJ      Full                                                        +---------+---------------+---------+-----------+----------+--------------+ FV Prox  Full                                                        +---------+---------------+---------+-----------+----------+--------------+ FV Mid   Full                                                        +---------+---------------+---------+-----------+----------+--------------+ FV DistalFull                                                        +---------+---------------+---------+-----------+----------+--------------+ PFV      Full                                                        +---------+---------------+---------+-----------+----------+--------------+ POP      Full           Yes      Yes                                  +---------+---------------+---------+-----------+----------+--------------+ PTV      Full                                                        +---------+---------------+---------+-----------+----------+--------------+ PERO     Full                                                        +---------+---------------+---------+-----------+----------+--------------+     Summary: RIGHT: - Findings consistent with acute deep vein thrombosis involving the right external iliac vein, common femoral vein, SF junction, and femoral vein.  - No cystic structure found in the popliteal fossa.  LEFT: - There is no evidence of deep vein thrombosis in the lower extremity.  - No cystic structure found in the  popliteal fossa.  *See table(s) above for measurements and observations. Electronically signed by Coral Else MD on 04/27/2023 at 7:39:07 AM.    Final         Scheduled Meds:  apixaban  10 mg Oral BID   Followed by   Melene Muller ON 05/03/2023] apixaban  5 mg Oral BID   Chlorhexidine Gluconate Cloth  6 each Topical Daily   morphine  60 mg Oral 3 times per day   OLANZapine  10 mg Oral QHS   polyethylene glycol  17 g Oral Daily   senna-docusate  2 tablet Oral TID   Continuous Infusions:     LOS: 5 days    Time spent: 35 minutes    Milca Sytsma A Dreonna Hussein, MD [facility]   If 7PM-7AM, please contact night-coverage [email or web address]  04/27/2023, 2:27 PM

## 2023-04-27 NOTE — Plan of Care (Signed)

## 2023-04-28 ENCOUNTER — Encounter: Payer: Self-pay | Admitting: Hematology and Oncology

## 2023-04-28 ENCOUNTER — Other Ambulatory Visit (HOSPITAL_COMMUNITY): Payer: Self-pay

## 2023-04-28 DIAGNOSIS — Z7189 Other specified counseling: Secondary | ICD-10-CM | POA: Diagnosis not present

## 2023-04-28 DIAGNOSIS — Z515 Encounter for palliative care: Secondary | ICD-10-CM | POA: Diagnosis not present

## 2023-04-28 DIAGNOSIS — C259 Malignant neoplasm of pancreas, unspecified: Secondary | ICD-10-CM | POA: Diagnosis not present

## 2023-04-28 LAB — CBC
HCT: 25.2 % — ABNORMAL LOW (ref 36.0–46.0)
Hemoglobin: 7.6 g/dL — ABNORMAL LOW (ref 12.0–15.0)
MCH: 28.3 pg (ref 26.0–34.0)
MCHC: 30.2 g/dL (ref 30.0–36.0)
MCV: 93.7 fL (ref 80.0–100.0)
Platelets: 124 10*3/uL — ABNORMAL LOW (ref 150–400)
RBC: 2.69 MIL/uL — ABNORMAL LOW (ref 3.87–5.11)
RDW: 17.9 % — ABNORMAL HIGH (ref 11.5–15.5)
WBC: 7.9 10*3/uL (ref 4.0–10.5)
nRBC: 0 % (ref 0.0–0.2)

## 2023-04-28 LAB — PREPARE RBC (CROSSMATCH)

## 2023-04-28 LAB — ABO/RH: ABO/RH(D): A POS

## 2023-04-28 MED ORDER — HYDROMORPHONE HCL 4 MG PO TABS
4.0000 mg | ORAL_TABLET | ORAL | 0 refills | Status: AC | PRN
Start: 2023-04-28 — End: 2023-05-03

## 2023-04-28 MED ORDER — APIXABAN 5 MG PO TABS: ORAL_TABLET | ORAL | 0 refills | Status: DC

## 2023-04-28 MED ORDER — SODIUM CHLORIDE 0.9% IV SOLUTION: Freq: Once | INTRAVENOUS | Status: AC

## 2023-04-28 MED ORDER — FUROSEMIDE 10 MG/ML IJ SOLN
20.0000 mg | Freq: Once | INTRAMUSCULAR | Status: AC
  Administered 2023-04-28: 20 mg via INTRAVENOUS
  Filled 2023-04-28: qty 2

## 2023-04-28 MED ORDER — HEPARIN SOD (PORK) LOCK FLUSH 100 UNIT/ML IV SOLN
500.0000 [IU] | INTRAVENOUS | Status: AC | PRN
  Administered 2023-04-28: 500 [IU]
  Filled 2023-04-28: qty 5

## 2023-04-28 MED ORDER — MORPHINE SULFATE ER 15 MG PO TBCR: 30.0000 mg | EXTENDED_RELEASE_TABLET | ORAL | Status: DC

## 2023-04-28 NOTE — Plan of Care (Signed)
  Problem: Education: Goal: Knowledge of General Education information will improve Description: Including pain rating scale, medication(s)/side effects and non-pharmacologic comfort measures Outcome: Adequate for Discharge   Problem: Health Behavior/Discharge Planning: Goal: Ability to manage health-related needs will improve Outcome: Adequate for Discharge   Problem: Clinical Measurements: Goal: Ability to maintain clinical measurements within normal limits will improve Outcome: Adequate for Discharge Goal: Diagnostic test results will improve Outcome: Adequate for Discharge

## 2023-04-28 NOTE — Discharge Summary (Signed)
 Physician Discharge Summary   Patient: Kaitlin Ferrell MRN: [id number] DOB: Sep 22, 1965  Admit date:     04/22/2023  Discharge date: 04/28/23  Discharge Physician: Alba Cory   PCP: Bernadette Hoit, MD   Recommendations at discharge:    Follow up with oncology and palliative care for further care of pancreatic ca.   Discharge Diagnoses: Principal Problem:   Abdominal pain Active Problems:   Palliative care by specialist   High risk medication use   Cancer related pain   Goals of care, counseling/discussion   Malignant neoplasm of pancreas (HCC)  Resolved Problems:   * No resolved hospital problems. *  Hospital Course: 58 year old with past medical history significant for metastatic pancreatic adenocarcinoma, not on treatment due to cancer progression, hypertension, portal vein occlusion, right upper extremity DVT presents with intractable abdominal and back pain.  Recently discharged 3 8/12 after bilateral nephrostomy tube placement and celiac plexus block by IR, had significantly relief but only for 2 days and abdominal pain returned and she was admitted for pain management.    Assessment and Plan: 1-Cancer related pain: -Palliative care consulted for pain management. -Patient was started on PCA pump, pain has been better controlled after CT-guided ethanol neurolysis of the celiac plexus 3/17 -Continue with long-acting morphine. Off PCA  -Patient report pain is better, plan to try oral rescued medications.  Pain controlled on current oral meds.  Stable for discharge   Acute DVT involving the right external iliac vein, common femoral vein SF junction and femoral vein -History of DVT: -Patient Eliquis was held due to thrombocytopenia, resume on 3/18. -Patient reports bilateral lower extremity edema worse on the left. -BL  lower extremity Doppler: Show right acute DVT involving the right external iliac vein, common femoral vein, SF junction and femoral  vein. -Eliquis will be changed to therapeutic dose Continue to monitor platelet count. Platelet stable.    Stage IV pancreatic adenocarcinoma: -Follows Dr. Leonides Schanz as an outpatient, has metastatic involvement liver, unfortunately she is not on treatment due to progression of the disease  -There are plans in place for her to be referred to [facility] for potentially clinical trials, to be done as an outpatient    Bilateral nephrostomy tube: Noted.    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 Hb at 7.6. One unit PRBC given prior to discharge    Hypokalemia; replaced          Consultants: Palliative Procedures performed: CT-guided ethanol neurolysis of the celiac plexus 3/17 Disposition: Home Diet recommendation:  Discharge Diet Orders (From admission, onward)     Start     Ordered   04/28/23 0000  Diet - low sodium heart healthy        04/28/23 1003           Regular diet DISCHARGE MEDICATION: Allergies as of 04/28/2023       Reactions   Compazine [prochlorperazine Edisylate]    Very agitated   Propofol Other (See Comments)   Headache   Oxycodone-acetaminophen Itching, Other (See Comments)   Welts, too   Prochlorperazine    Labetalol Hives, Itching, Swelling, Rash        Medication List     STOP taking these medications    magnesium oxide 400 (241.3 Mg) MG tablet Commonly known as: MAG-OX   ondansetron 8 MG disintegrating tablet Commonly known as: ZOFRAN-ODT       TAKE these medications    apixaban 5 MG Tabs tablet Commonly known as: ELIQUIS Take 2  tablets (10 mg total) by mouth 2 (two) times daily for 4 days, THEN 1 tablet (5 mg total) 2 (two) times daily. Start taking on: April 28, 2023   eletriptan 40 MG tablet Commonly known as: Relpax Take 1 tablet (40 mg total) by mouth as needed for migraine or headache. May repeat in 2 hours if needed. What changed:  reasons to take this additional instructions   HYDROmorphone 4 MG  tablet Commonly known as: Dilaudid Take 1-1.5 tablets (4-6 mg total) by mouth every 3 (three) hours as needed for up to 5 days for severe pain (pain score 7-10). What changed:  medication strength how much to take when to take this   lidocaine-prilocaine cream Commonly known as: EMLA Apply 1 Application topically as needed. What changed: reasons to take this   LORazepam 0.5 MG tablet Commonly known as: ATIVAN Take 0.5 mg by mouth daily as needed for anxiety.   morphine 30 MG 12 hr tablet Commonly known as: MS CONTIN Take 1 tablet (30 mg total) by mouth every 8 (eight) hours. What changed:  when to take this additional instructions   morphine 15 MG 12 hr tablet Commonly known as: MS CONTIN Take 2 tablets (30 mg total) by mouth See admin instructions. Take 30 mg by mouth at 6 AM, 2 PM, and 10 PM in conjunction with ONE 30 mg tablet to equal a total dose of 60 mg What changed:  how much to take when to take this additional instructions   OLANZapine 10 MG tablet Commonly known as: ZYPREXA TAKE 1 TABLET BY MOUTH EVERYDAY AT BEDTIME What changed: See the new instructions.   ondansetron 8 MG tablet Commonly known as: ZOFRAN Take 1 tablet (8 mg total) by mouth every 8 (eight) hours as needed for nausea or vomiting.   pantoprazole 40 MG tablet Commonly known as: PROTONIX Take 40 mg by mouth daily as needed (for GERD-like symptoms).   polyethylene glycol 17 g packet Commonly known as: MIRALAX / GLYCOLAX Take 17 g by mouth 3 (three) times daily.   promethazine 25 MG tablet Commonly known as: PHENERGAN Take 1 tablet (25 mg total) by mouth every 6 (six) hours as needed for nausea or vomiting.   scopolamine 1 MG/3DAYS Commonly known as: TRANSDERM-SCOP Place 1 patch (1.5 mg total) onto the skin every 3 (three) days.   senna-docusate 8.6-50 MG tablet Commonly known as: Senokot-S Take 2 tablets by mouth 3 (three) times daily.   sodium chloride flush 0.9 % Soln  injection Place 5 mLs into feeding tube as needed. Flush each nephrostomy tube with 5 mm as needed if noted decreased output.   TYLENOL 500 MG tablet Generic drug: acetaminophen Take 1,000 mg by mouth every 6 (six) hours as needed for mild pain (pain score 1-3) or headache.   Yuvafem 10 MCG Tabs vaginal tablet Generic drug: Estradiol Place 10 mcg vaginally 2 (two) times a week.        Discharge Exam: Filed Weights   04/22/23 0152  Weight: 52.6 kg   General; NAD  Condition at discharge: stable  The results of significant diagnostics from this hospitalization (including imaging, microbiology, ancillary and laboratory) are listed below for reference.   Imaging Studies: VAS [place] LOWER EXTREMITY VENOUS (DVT) Result Date: 04/27/2023  Lower Venous DVT Study Patient Name:  ILKA LOVICK [facility]  Date of Exam:   04/26/2023 Medical Rec #: [id number]                 Accession #:  [id number] Date of Birth: [birth year]                 Patient Gender: F Patient Age:   83 years Exam Location:  [facility] Procedure:      VAS [place] LOWER EXTREMITY VENOUS (DVT) Referring Phys: Jon Billings Shanel Prazak --------------------------------------------------------------------------------  Indications: Edema.  Risk Factors: Cancer. Limitations: Poor ultrasound/tissue interface. Comparison Study: No prior studies. Performing Technologist: Chanda Busing RVT  Examination Guidelines: A complete evaluation includes B-mode imaging, spectral Doppler, color Doppler, and power Doppler as needed of all accessible portions of each vessel. Bilateral testing is considered an integral part of a complete examination. Limited examinations for reoccurring indications may be performed as noted. The reflux portion of the exam is performed with the patient in reverse Trendelenburg.  +---------+---------------+---------+-----------+----------+--------------+ RIGHT    CompressibilityPhasicitySpontaneityPropertiesThrombus Aging  +---------+---------------+---------+-----------+----------+--------------+ CFV      Partial        No       No                   Acute          +---------+---------------+---------+-----------+----------+--------------+ SFJ      None           No       No                   Acute          +---------+---------------+---------+-----------+----------+--------------+ FV Prox  Partial        No       No                   Acute          +---------+---------------+---------+-----------+----------+--------------+ FV Mid   Full                                                        +---------+---------------+---------+-----------+----------+--------------+ FV DistalFull                                                        +---------+---------------+---------+-----------+----------+--------------+ PFV      Full           No       No                                  +---------+---------------+---------+-----------+----------+--------------+ POP      Full           Yes      Yes                                 +---------+---------------+---------+-----------+----------+--------------+ PTV      Full                                                        +---------+---------------+---------+-----------+----------+--------------+ PERO  Full                                                        +---------+---------------+---------+-----------+----------+--------------+ EIV      Partial                                                     +---------+---------------+---------+-----------+----------+--------------+ CIV                                                   Not visualized +---------+---------------+---------+-----------+----------+--------------+   +---------+---------------+---------+-----------+----------+--------------+ LEFT     CompressibilityPhasicitySpontaneityPropertiesThrombus Aging  +---------+---------------+---------+-----------+----------+--------------+ CFV      Full           Yes      Yes                                 +---------+---------------+---------+-----------+----------+--------------+ SFJ      Full                                                        +---------+---------------+---------+-----------+----------+--------------+ FV Prox  Full                                                        +---------+---------------+---------+-----------+----------+--------------+ FV Mid   Full                                                        +---------+---------------+---------+-----------+----------+--------------+ FV DistalFull                                                        +---------+---------------+---------+-----------+----------+--------------+ PFV      Full                                                        +---------+---------------+---------+-----------+----------+--------------+ POP      Full           Yes      Yes                                 +---------+---------------+---------+-----------+----------+--------------+  PTV      Full                                                        +---------+---------------+---------+-----------+----------+--------------+ PERO     Full                                                        +---------+---------------+---------+-----------+----------+--------------+     Summary: RIGHT: - Findings consistent with acute deep vein thrombosis involving the right external iliac vein, common femoral vein, SF junction, and femoral vein.  - No cystic structure found in the popliteal fossa.  LEFT: - There is no evidence of deep vein thrombosis in the lower extremity.  - No cystic structure found in the popliteal fossa.  *See table(s) above for measurements and observations. Electronically signed by Coral Else MD on 04/27/2023 at 7:39:07 AM.    Final    CT CELIAC  PLEXUS BLOCK NEUROLYTIC Result Date: 04/24/2023 INDICATION: 58 year old female with pancreatic cancer severe intractable cancer pain. She presents for celiac plexus neuro lysis EXAM: CT-guided celiac plexus neuro lysis with 100% alcohol COMPARISON:  Prior CT scan of the abdomen and pelvis 04/14/2023 MEDICATIONS: None ANESTHESIA/SEDATION: A total of 2 mg Versed, 100 mcg fentanyl and 25 mg Benadryl were administered intravenously by the Radiology nurse. The patient's level of consciousness and vital signs were monitored continuously by the radiology nurse under my direct supervision. A total monitored sedation time of 18 minutes was obtained. FLUOROSCOPY: None. COMPLICATIONS: None immediate. TECHNIQUE: Informed written consent was obtained from the patient after a thorough discussion of the procedural risks, benefits and alternatives. All questions were addressed. Maximal Sterile Barrier Technique was utilized including caps, mask, sterile gowns, sterile gloves, sterile drape, hand hygiene and skin antiseptic. A timeout was performed prior to the initiation of the procedure. A planning axial CT scan was performed. A small fleck of calcium just cephalad to the origin of the celiac artery was identified and used as a target. A suitable skin entry site was selected and marked. After cleaning and prepping in the standard fashion using chlorhexidine skin prep. Local anesthesia was attained by infiltration with 1% lidocaine. A 22 gauge spinal needle was then carefully advanced to the desired target just outside of the aorta and superior to the origin of the celiac axis. A small amount of 0.5% Sensorcaine mixed with Omnipaque 300 was injected. The contrast tracks around the aorta in the retroperitoneal space in the desired location. Therefore, a total of 5 mL of 0.5% Sensorcaine was slowly injected. Several minutes were given to allow for full local anesthesia. A total of 15 mL dehydrated alcohol was next injected.  Intermittent CT imaging was performed confirming expected placement of the injectate which surrounds the celiac artery in the region the celiac plexus. The needle was removed.  The patient tolerated the procedure well. FINDINGS: Technically successful CT-guided celiac plexus neuro lysis. IMPRESSION: Technically successful CT-guided celiac plexus neuro lysis. Electronically Signed   By: Malachy Moan M.D.   On: 04/24/2023 17:33   CT CELIAC PLEXUS BLOCK ANESTHETIC Result Date: 04/18/2023 INDICATION: Pancreatic cancer with intractable abdominal pain. EXAM: CT-GUIDED CELIAC PLEXUS  NERVE BLOCK, DIAGNOSTIC AND THERAPEUTIC COMPARISON:  CT AP, 04/14/2023. MEDICATIONS: 80 mg Kenalog and 30 mL 0.5% bupivacaine. ANESTHESIA/SEDATION: Moderate (conscious) sedation was employed during this procedure. A total of Versed 3 mg and Fentanyl 150 mcg was administered intravenously. Moderate Sedation Time: 35 minutes. The patient's level of consciousness and vital signs were monitored continuously by radiology nursing throughout the procedure under my direct supervision. CONTRAST:  None. FLUOROSCOPY TIME:  CT dose; 296 mGycm COMPLICATIONS: None immediate. PROCEDURE: RADIATION DOSE REDUCTION: This exam was performed according to the departmental dose-optimization program which includes automated exposure control, adjustment of the mA and/or kV according to patient size and/or use of iterative reconstruction technique. Informed consent was obtained from the patient following an explanation of the procedure, risks, benefits and alternatives. A time out was performed prior to the initiation of the procedure. The patient was positioned prone on the CT table and a limited CT was performed for procedural planning demonstrating an adequate window at the upper abdomen. The procedure was planned. A timeout was performed prior to the initiation of the procedure. The operative site was prepped and draped in the usual sterile fashion.  Appropriate trajectory was confirmed with a 22 gauge spinal needle after the adjacent tissues were anesthetized with 1% Lidocaine with epinephrine. Under intermittent CT guidance, 15 cm 21-gauge Chiba needles were inserted via a posterior approach and the needle tips were positioned in the retroperitoneal space immediately anterolateral to the aorta, at the region of the celiac trunk. A small amount of dilute contrast was injected through the needles to confirm position. A solution containing 2 mL of 40 mg/mL Kenalog and 15 mL of Bupivacaine was mixed and injected through each needle. Intermittent CT scanning confirmed appropriate spread into the extra vascular spaces. The needles were removed and hemostasis was achieved with manual compression. A limited postprocedural CT was negative for hemorrhage or additional complication. A dressing was placed. The patient tolerated the procedure well without immediate postprocedural complication. IMPRESSION: Successful CT-guided diagnostic and therapeutic celiac plexus nerve block, as above. PLAN: The patient will be re-evaluated in the postprocedural setting and again in 3-6 months to assess the efficacy of this nerve block. If successful, the patient may be a candidate for future celiac plexus nerve blocks versus neurolysis. Roanna Banning, MD [facility] Radiology Electronically Signed   By: Roanna Banning M.D.   On: 04/18/2023 16:43   IR NEPHROSTOMY PLACEMENT BILATERAL Result Date: 04/17/2023 CLINICAL DATA:  Metastatic pancreatic adenocarcinoma. New bilateral moderately severe hydronephrosis, acute abdominal pain EXAM: BILATERAL PERCUTANEOUS NEPHROSTOMY CATHETER PLACEMENT UNDER ULTRASOUND AND FLUOROSCOPIC GUIDANCE FLUOROSCOPY: Radiation Exposure Index (as provided by the fluoroscopic device): 3 mGy air Kerma TECHNIQUE: The procedure, risks (including but not limited to bleeding, infection, organ damage ), benefits, and  alternatives were explained to the patient. Questions regarding the procedure were encouraged and answered. The patient understands and consents to the procedure. bilateralflank regions prepped with Betadine, draped in usual sterile fashion, infiltrated locally with 1% lidocaine. As antibiotic prophylaxis, Rocephin 2 g was ordered pre-procedure and administered intravenously within one hour of incision. Intravenous Fentanyl and Versed 2mg  were administered by RN during a total moderate (conscious) sedation time of 18 minutes; the patient's level of consciousness and physiological / cardiorespiratory status were monitored continuously by radiology RN under my direct supervision. Under real-time ultrasound guidance, a 21-gauge micropuncture needle was advanced into a right posterior lower pole calyx. Ultrasound image documentation was saved. Urine spontaneously returned through the needle.  Needle was exchanged over a guidewire for transitional dilator. Contrast injection confirmed appropriate positioning. Catheter was exchanged over a guidewire for a 10 French pigtail catheter, formed centrally within the right renal collecting system. Contrast injection confirms appropriate positioning and patency. In similar fashion, Under real-time ultrasound guidance, a 21-gauge micropuncture needle was advanced into a left posterior lower pole calyx. Ultrasound image documentation was saved. Urine spontaneously returned through the needle. Needle was exchanged over a guidewire for transitional dilator. Contrast injection confirmed appropriate positioning. Catheter was exchanged over a guidewire for a 10 French pigtail catheter, formed centrally within the left renal collecting system. Contrast injection confirms appropriate positioning and patency. Both catheters were secured externally with 0 Prolene sutures and placed to external drain bags. The patient tolerated the procedure well. COMPLICATIONS: COMPLICATIONS none  IMPRESSION: 1. Technically successful bilateral percutaneous nephrostomy catheter placement. Electronically Signed   By: Corlis Leak M.D.   On: 04/17/2023 16:36   CT ABDOMEN PELVIS W CONTRAST Result Date: 04/14/2023 CLINICAL DATA:  Acute generalized abdominal pain. History of pancreatic cancer. EXAM: CT ABDOMEN AND PELVIS WITH CONTRAST TECHNIQUE: Multidetector CT imaging of the abdomen and pelvis was performed using the standard protocol following bolus administration of intravenous contrast. RADIATION DOSE REDUCTION: This exam was performed according to the departmental dose-optimization program which includes automated exposure control, adjustment of the mA and/or kV according to patient size and/or use of iterative reconstruction technique. CONTRAST:  OMNIPAQUE IOHEXOL 300 MG/ML  SOLN COMPARISON:  March 20, 2023. FINDINGS: Lower chest: No acute abnormality. Hepatobiliary: No cholelithiasis or biliary dilatation is noted. Multiple hepatic lesions are again noted consistent with metastatic disease. The largest measures 2.8 x 2.2 cm in posterior segment of right hepatic lobe which is enlarged compared to prior exam. 2.6 x 2.6 cm lesion is noted in superior portion of right hepatic lobe which is enlarged compared to prior exam as well. Pancreas: 3.5 x 2.3 cm pancreatic head mass is noted consistent with malignancy. Mild dilatation of common bile duct is noted. 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 It also seems to surround the proximal portion of the superior mesenteric artery, but it remains patent. Spleen: Normal in size without focal abnormality. Adrenals/Urinary Tract: Adrenal glands are unremarkable. Decreased enhancement of left kidney is noted. Moderate bilateral hydroureteronephrosis is noted without evidence of obstructing calculus, concerning for distal ureteral occlusion. Urinary bladder is only minimally distended.  Stomach/Bowel: The stomach is unremarkable. There is no evidence of small bowel dilatation. Large amount of stool and fluid is seen throughout the colon. The appendix appears normal. There appears to be moderate to severe wall thickening of the rectum and possibly distal sigmoid colon suggesting inflammation or possibly malignancy. There are noted probable enhancing soft tissue lesions in this area suggesting peritoneal carcinomatosis. Vascular/Lymphatic: Abdominal aorta is unremarkable. 1.4 cm right external iliac lymph node is noted concerning for metastatic disease. 8 mm left periaortic lymph node is noted. 9 mm right periaortic lymph node is noted as well. Reproductive: Status post hysterectomy. No adnexal masses. Other: No ascites or hernia is noted. Musculoskeletal: No acute or significant osseous findings. IMPRESSION: There is interval development of moderate bilateral hydroureteronephrosis without evidence of obstructing calculus. This most likely is due to obstruction of distal ureters due to external compression, most likely due to possible peritoneal implants or other metastatic disease in the pelvis. There is noted severe wall thickening of the rectum and probable distal sigmoid  colon due to either inflammation or metastatic disease. Hepatic metastatic lesions are again noted which appear to be increased in size compared to prior exam. 3.5 x 2.3 cm pancreatic head mass is noted consistent with malignancy. 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 This mass also appears to encase the superior mesenteric artery, but the artery remains patent. Retroperitoneal and right external iliac adenopathy is noted consistent with metastatic disease. Electronically Signed   By: Lupita Raider M.D.   On: 04/14/2023 12:41   DG Abdomen 1 View Result Date: 04/14/2023 CLINICAL DATA:  Abdominal pain and constipation EXAM: ABDOMEN - 1 VIEW COMPARISON:  CT  abdomen and pelvis dated 03/20/2023 FINDINGS: Nonobstructive bowel gas pattern. No pneumatosis. Large volume stool throughout the colon. No abnormal radio-opaque calculi or mass effect. No acute or substantial osseous abnormality. The sacrum and coccyx are partially obscured by overlying bowel contents. IMPRESSION: Large volume stool throughout the colon. Electronically Signed   By: Agustin Cree M.D.   On: 04/14/2023 10:01    Microbiology: Results for orders placed or performed during the hospital encounter of 03/25/23  Resp panel by RT-PCR (RSV, Flu A&B, Covid) Anterior Nasal Swab     Status: None   Collection Time: 03/25/23 11:27 AM   Specimen: Anterior Nasal Swab  Result Value Ref Range Status   SARS Coronavirus 2 by RT PCR NEGATIVE NEGATIVE Final    Comment: (NOTE) SARS-CoV-2 target nucleic acids are NOT DETECTED.  The SARS-CoV-2 RNA is generally detectable in upper respiratory specimens during the acute phase of infection. The lowest concentration of SARS-CoV-2 viral copies this assay can detect is 138 copies/mL. A negative result does not preclude SARS-Cov-2 infection and should not be used as the sole basis for treatment or other patient management decisions. A negative result may occur with  improper specimen collection/handling, submission of specimen other than nasopharyngeal swab, presence of viral mutation(s) within the areas targeted by this assay, and inadequate number of viral copies(<138 copies/mL). A negative result must be combined with clinical observations, patient history, and epidemiological information. The expected result is Negative.  Fact Sheet for Patients:  BloggerCourse.com  Fact Sheet for Healthcare Providers:  SeriousBroker.it  This test is no t yet approved or cleared by the [place] FDA and  has been authorized for detection and/or diagnosis of SARS-CoV-2 by FDA under an Emergency Use Authorization  (EUA). This EUA will remain  in effect (meaning this test can be used) for the duration of the COVID-19 declaration under Section 564(b)(1) of the Act, 21 U.S.C.section 360bbb-3(b)(1), unless the authorization is terminated  or revoked sooner.       Influenza A by PCR NEGATIVE NEGATIVE Final   Influenza B by PCR NEGATIVE NEGATIVE Final    Comment: (NOTE) The Xpert Xpress SARS-CoV-2/FLU/RSV plus assay is intended as an aid in the diagnosis of influenza from Nasopharyngeal swab specimens and should not be used as a sole basis for treatment. Nasal washings and aspirates are unacceptable for Xpert Xpress SARS-CoV-2/FLU/RSV testing.  Fact Sheet for Patients: BloggerCourse.com  Fact Sheet for Healthcare Providers: SeriousBroker.it  This test is not yet approved or cleared by the [place] FDA and has been authorized for detection and/or diagnosis of SARS-CoV-2 by FDA under an Emergency Use Authorization (EUA). This EUA will remain in effect (meaning this test can be used) for the duration of the COVID-19 declaration under Section 564(b)(1) of the Act, 21 U.S.C. section  360bbb-3(b)(1), unless the authorization is terminated or revoked.     Resp Syncytial Virus by PCR NEGATIVE NEGATIVE Final    Comment: (NOTE) Fact Sheet for Patients: BloggerCourse.com  Fact Sheet for Healthcare Providers: SeriousBroker.it  This test is not yet approved or cleared by the [place] FDA and has been authorized for detection and/or diagnosis of SARS-CoV-2 by FDA under an Emergency Use Authorization (EUA). This EUA will remain in effect (meaning this test can be used) for the duration of the COVID-19 declaration under Section 564(b)(1) of the Act, 21 U.S.C. section 360bbb-3(b)(1), unless the authorization is terminated or revoked.  Performed at [facility], [address], [place], [place] [address]     Labs: CBC: Recent Labs  Lab 04/22/23 0201 04/23/23 0235 04/24/23 0205 04/26/23 1944 04/27/23 0306 04/28/23 0313  WBC 10.5 10.7* 9.5 10.4 8.2 7.9  NEUTROABS 8.0*  --  7.3  --   --   --   HGB 10.0* 8.2* 8.1* 8.9* 7.6* 7.6*  HCT 32.5* 27.5* 27.5* 29.4* 26.0* 25.2*  MCV 90.8 91.7 94.5 92.2 94.2 93.7  PLT 141* 141* 116* 129* 105* 124*   Basic Metabolic Panel: Recent Labs  Lab 04/22/23 0201 04/23/23 0235 04/25/23 0254 04/27/23 0306  NA 137 135 139 138  K 3.5 3.9 3.4* 3.6  CL 102 105 110 108  CO2 26 25 22 25   GLUCOSE 114* 130* 117* 122*  BUN 9 8 8  <5*  CREATININE 0.62 0.43* 0.41* 0.49  CALCIUM 8.8* 8.3* 7.7* 8.1*  MG  --   --  1.7  --   PHOS  --   --  2.9  --    Liver Function Tests: Recent Labs  Lab 04/22/23 0201 04/25/23 0254  AST 30 17  ALT 15 11  ALKPHOS 86 60  BILITOT 0.4 0.6  PROT 7.2 5.5*  ALBUMIN 3.6 2.5*   CBG: No results for input(s): "GLUCAP" in the last 168 hours.  Discharge time spent: greater than 30 minutes.  Signed: Alba Cory, MD [facility] 04/28/2023

## 2023-04-28 NOTE — Progress Notes (Signed)
 Daily Progress Note   Patient Name: Kaitlin Ferrell       Date: 04/28/2023 DOB: Jan 13, 1966  Age: 58 y.o. MRN#: [id number] Attending Physician: Alba Cory, MD Primary Care Physician: Bernadette Hoit, MD Admit Date: 04/22/2023 Length of Stay: 6 days  Reason for Consultation/Follow-up: Pain control  Subjective:   CC:  Following up pain control.  Subjective:  Reviewed EMR prior to presenting to bedside.      Patient continues to receive MS Contin 60 mg every 8 hours during the day. She is having regular bowel movement    Presented to bedside to see patient.  Patient sitting up comfortably in bed.  Husband at bedside.    We discussed her opioid regimen.  She is using higher doses of oral hydromorphone 4-6 mg on an as-needed basis, patient continues on long-acting MS Contin 60 mg every 8 hours.   Objective:   Vital Signs:  BP 116/77 (BP Location: Right Arm)   Pulse 94   Temp 98.7 F (37.1 C) (Oral)   Resp 18   Ht 5\' 2"  (1.575 m)   Wt 52.6 kg   LMP 06/26/2013 Comment: low dose BCP  SpO2 99%   BMI 21.22 kg/m   Physical Exam: General: NAD, alert, chronically ill-appearing Cardiovascular: RRR Respiratory: no increased work of breathing noted, not in respiratory distress Neuro: A&Ox4, following commands easily Psych: appropriately answers all questions  Imaging:  I personally reviewed recent imaging.   Assessment & Plan:   Assessment: Patient is a 58 year old female with a past medical history of metastatic pancreatic cancer, hypertension, GERD, and portal vein occlusion as well as right upper extremity DVT on Eliquis who was admitted on 04/22/2023 for worsening cancer pain.  Patient had recently been hospitalized and was just discharged on 04/19/2023 after receiving celiac plexus nerve block which had brought patient's pain to 0.  Patient returning to the hospital noting her pain has returned.  IR consulted to determine if celiac plexus neurolysis can be  expedited.  Palliative medicine team consulted to assist with complex medical decision making. Of note, patient recently seen by this palliative medicine provider during last hospitalization.  Recommendations/Plan: # Symptom management Patient is receiving these palliative interventions for symptom management with an intent to improve quality of life.                 -Severe acute on chronic pain in the setting of metastatic pancreatic cancer. Patient had celiac plexus nerve block performed on 04/18/2023 which had completely alleviated pain and patient was discharged on 04/19/2023.  Unfortunately nerve block wore off and patient is returning with the same pain. still has some rescue IV Dilaudid to be used on an as-needed basis,  oral Dilaudid-4 to 6 mg on an as-needed basis..  Discussed with the patient about her opioid regimen.  Remains on MS Contin 60 mg every 8 hours during the day.     -Insomnia/mood     olanzapine to 10 mg nightly scheduled,     -Personally reviewed EKG from 04/22/2023 which noted QTc 438   -Constipation   -Continue senna 2 tab 3 times daily   -Change MiraLAX to 17 g daily   -Continue current bowel regimen.  Constipation resolved. # Discharge Planning:  home when stable, recommend outpatient palliative follow up at cancer center.    Discussed with: Patient, husband at bedside.  Thank you for allowing the palliative care team to participate in the care [name].  Mod MDM [place]  Linna Darner MD.  Palliative Care Provider PMT # [phone]  If patient remains symptomatic despite maximum doses, please call PMT at [phone] between 0700 and 1900. Outside of these hours, please call attending, as PMT does not have night coverage.

## 2023-04-28 NOTE — Discharge Instructions (Signed)
 Information on my medicine - ELIQUIS (apixaban)  Why was Eliquis prescribed for you? Eliquis was prescribed to treat blood clots that may have been found in the veins of your legs (deep vein thrombosis) or in your lungs (pulmonary embolism) and to reduce the risk of them occurring again.  What do You need to know about Eliquis ? The starting dose is 10 mg (two 5 mg tablets) taken TWICE daily for the FIRST SEVEN (7) DAYS, then on 05/03/23 the dose is reduced to ONE 5 mg tablet taken TWICE daily.  Eliquis may be taken with or without food.   Try to take the dose about the same time in the morning and in the evening. If you have difficulty swallowing the tablet whole please discuss with your pharmacist how to take the medication safely.  Take Eliquis exactly as prescribed and DO NOT stop taking Eliquis without talking to the doctor who prescribed the medication.  Stopping may increase your risk of developing a new blood clot.  Refill your prescription before you run out.  After discharge, you should have regular check-up appointments with your healthcare provider that is prescribing your Eliquis.    What do you do if you miss a dose? If a dose of ELIQUIS is not taken at the scheduled time, take it as soon as possible on the same day and twice-daily administration should be resumed. The dose should not be doubled to make up for a missed dose.  Important Safety Information A possible side effect of Eliquis is bleeding. You should call your healthcare provider right away if you experience any of the following: Bleeding from an injury or your nose that does not stop. Unusual colored urine (red or dark brown) or unusual colored stools (red or black). Unusual bruising for unknown reasons. A serious fall or if you hit your head (even if there is no bleeding).  Some medicines may interact with Eliquis and might increase your risk of bleeding or clotting while on Eliquis. To help avoid this,  consult your healthcare provider or pharmacist prior to using any new prescription or non-prescription medications, including herbals, vitamins, non-steroidal anti-inflammatory drugs (NSAIDs) and supplements.  This website has more information on Eliquis (apixaban): http://www.eliquis.com/eliquis/home

## 2023-04-28 NOTE — Plan of Care (Signed)

## 2023-04-29 LAB — TYPE AND SCREEN
ABO/RH(D): A POS
Antibody Screen: NEGATIVE
Unit division: 0

## 2023-04-29 LAB — BPAM RBC
Blood Product Expiration Date: 202504192359
ISSUE DATE / TIME: 202503211114
Unit Type and Rh: 6200

## 2023-05-01 ENCOUNTER — Other Ambulatory Visit (HOSPITAL_COMMUNITY): Payer: Self-pay | Admitting: Interventional Radiology

## 2023-05-01 DIAGNOSIS — N1339 Other hydronephrosis: Secondary | ICD-10-CM

## 2023-05-02 ENCOUNTER — Inpatient Hospital Stay (HOSPITAL_BASED_OUTPATIENT_CLINIC_OR_DEPARTMENT_OTHER): Admitting: Nurse Practitioner

## 2023-05-02 ENCOUNTER — Encounter: Payer: Self-pay | Admitting: Nurse Practitioner

## 2023-05-02 ENCOUNTER — Emergency Department (HOSPITAL_COMMUNITY)

## 2023-05-02 ENCOUNTER — Emergency Department (HOSPITAL_COMMUNITY)
Admission: EM | Admit: 2023-05-02 | Discharge: 2023-05-03 | Disposition: A | Discharge status: Home / Self Care | Attending: Emergency Medicine | Admitting: Emergency Medicine

## 2023-05-02 DIAGNOSIS — R1030 Lower abdominal pain, unspecified: Secondary | ICD-10-CM | POA: Insufficient documentation

## 2023-05-02 DIAGNOSIS — Z515 Encounter for palliative care: Secondary | ICD-10-CM

## 2023-05-02 DIAGNOSIS — I8290 Acute embolism and thrombosis of unspecified vein: Secondary | ICD-10-CM

## 2023-05-02 DIAGNOSIS — R1033 Periumbilical pain: Secondary | ICD-10-CM | POA: Insufficient documentation

## 2023-05-02 DIAGNOSIS — R000 Tachycardia, unspecified: Secondary | ICD-10-CM | POA: Insufficient documentation

## 2023-05-02 DIAGNOSIS — R530 Neoplastic (malignant) related fatigue: Secondary | ICD-10-CM

## 2023-05-02 DIAGNOSIS — C787 Secondary malignant neoplasm of liver and intrahepatic bile duct: Secondary | ICD-10-CM

## 2023-05-02 DIAGNOSIS — G893 Neoplasm related pain (acute) (chronic): Secondary | ICD-10-CM | POA: Diagnosis not present

## 2023-05-02 DIAGNOSIS — Z936 Other artificial openings of urinary tract status: Secondary | ICD-10-CM | POA: Insufficient documentation

## 2023-05-02 DIAGNOSIS — C259 Malignant neoplasm of pancreas, unspecified: Secondary | ICD-10-CM

## 2023-05-02 DIAGNOSIS — K5903 Drug induced constipation: Secondary | ICD-10-CM

## 2023-05-02 DIAGNOSIS — I82421 Acute embolism and thrombosis of right iliac vein: Secondary | ICD-10-CM | POA: Insufficient documentation

## 2023-05-02 DIAGNOSIS — Z7901 Long term (current) use of anticoagulants: Secondary | ICD-10-CM | POA: Diagnosis not present

## 2023-05-02 LAB — URINALYSIS, ROUTINE W REFLEX MICROSCOPIC
Bilirubin Urine: NEGATIVE
Glucose, UA: NEGATIVE mg/dL
Ketones, ur: 20 mg/dL — AB
Nitrite: NEGATIVE
Protein, ur: NEGATIVE mg/dL
Specific Gravity, Urine: 1.006 (ref 1.005–1.030)
pH: 7 (ref 5.0–8.0)

## 2023-05-02 LAB — CBC WITH DIFFERENTIAL/PLATELET
Abs Immature Granulocytes: 0.04 10*3/uL (ref 0.00–0.07)
Basophils Absolute: 0 10*3/uL (ref 0.0–0.1)
Basophils Relative: 0 %
Eosinophils Absolute: 0 10*3/uL (ref 0.0–0.5)
Eosinophils Relative: 0 %
HCT: 37.3 % (ref 36.0–46.0)
Hemoglobin: 11.2 g/dL — ABNORMAL LOW (ref 12.0–15.0)
Immature Granulocytes: 0 %
Lymphocytes Relative: 8 %
Lymphs Abs: 0.8 10*3/uL (ref 0.7–4.0)
MCH: 28.1 pg (ref 26.0–34.0)
MCHC: 30 g/dL (ref 30.0–36.0)
MCV: 93.5 fL (ref 80.0–100.0)
Monocytes Absolute: 1.1 10*3/uL — ABNORMAL HIGH (ref 0.1–1.0)
Monocytes Relative: 10 %
Neutro Abs: 8.8 10*3/uL — ABNORMAL HIGH (ref 1.7–7.7)
Neutrophils Relative %: 82 %
Platelets: 110 10*3/uL — ABNORMAL LOW (ref 150–400)
RBC: 3.99 MIL/uL (ref 3.87–5.11)
RDW: 17.5 % — ABNORMAL HIGH (ref 11.5–15.5)
WBC: 10.8 10*3/uL — ABNORMAL HIGH (ref 4.0–10.5)
nRBC: 0 % (ref 0.0–0.2)

## 2023-05-02 LAB — BASIC METABOLIC PANEL
Anion gap: 10 (ref 5–15)
BUN: 7 mg/dL (ref 6–20)
CO2: 26 mmol/L (ref 22–32)
Calcium: 9.3 mg/dL (ref 8.9–10.3)
Chloride: 101 mmol/L (ref 98–111)
Creatinine, Ser: 0.62 mg/dL (ref 0.44–1.00)
GFR, Estimated: >60 mL/min (ref >60)
Glucose, Bld: 91 mg/dL (ref 70–99)
Potassium: 3.6 mmol/L (ref 3.5–5.1)
Sodium: 137 mmol/L (ref 135–145)

## 2023-05-02 MED ORDER — HYDROMORPHONE HCL 1 MG/ML IJ SOLN
1.0000 mg | Freq: Once | INTRAMUSCULAR | Status: AC
  Administered 2023-05-02: 1 mg via INTRAVENOUS
  Filled 2023-05-02: qty 1

## 2023-05-02 MED ORDER — SODIUM CHLORIDE 0.9 % IV BOLUS
1000.0000 mL | Freq: Once | INTRAVENOUS | Status: AC
  Administered 2023-05-02: 1000 mL via INTRAVENOUS

## 2023-05-02 MED ORDER — METHADONE HCL 5 MG PO TABS: 5.0000 mg | ORAL_TABLET | Freq: Three times a day (TID) | ORAL | 0 refills | Status: DC

## 2023-05-02 MED ORDER — HYDROMORPHONE HCL 1 MG/ML IJ SOLN
2.0000 mg | Freq: Once | INTRAMUSCULAR | Status: AC
  Administered 2023-05-02: 2 mg via INTRAVENOUS
  Filled 2023-05-02: qty 2

## 2023-05-02 MED ORDER — NALOXEGOL OXALATE 12.5 MG PO TABS: 12.5000 mg | ORAL_TABLET | Freq: Every day | ORAL | 2 refills | Status: DC

## 2023-05-02 MED ORDER — KETOROLAC TROMETHAMINE 15 MG/ML IJ SOLN
15.0000 mg | Freq: Once | INTRAMUSCULAR | Status: AC
  Administered 2023-05-02: 15 mg via INTRAVENOUS
  Filled 2023-05-02: qty 1

## 2023-05-02 MED ORDER — IOHEXOL 300 MG/ML  SOLN
100.0000 mL | Freq: Once | INTRAMUSCULAR | Status: AC | PRN
  Administered 2023-05-02: 80 mL via INTRAVENOUS

## 2023-05-02 MED ORDER — HEPARIN SOD (PORK) LOCK FLUSH 100 UNIT/ML IV SOLN
500.0000 [IU] | Freq: Once | INTRAVENOUS | Status: AC
  Administered 2023-05-03: 500 [IU]
  Filled 2023-05-02: qty 5

## 2023-05-02 MED ORDER — FLEET ENEMA RE ENEM
1.0000 | ENEMA | Freq: Once | RECTAL | Status: AC
  Administered 2023-05-02: 1 via RECTAL
  Filled 2023-05-02: qty 1

## 2023-05-02 NOTE — ED Provider Notes (Addendum)
 [place] EMERGENCY DEPARTMENT AT [facility] Provider Note   CSN: [id number] Arrival date & time: 05/02/23  1328     History  Chief Complaint  Patient presents with   Nephrostomy tube problem    Kaitlin Ferrell is a 58 y.o. female.  58 year old female with a history of metastatic pancreatic cancer not on treatment due to progression, portal vein occlusion, DVT on Eliquis, with chronic pain who presents emergency department with lower abdominal pain and concern for leaking nephrostomy tube.  Patient noticed overnight that she was covered in what appeared to be urine.  Thinks that her nephrostomy tubes may have been leaking.  Feels like she has to urinate as well.  No fevers.  No chills.  No nausea or vomiting or diarrhea.  Says that she has been trying hydromorphone at home for pain but that it does not been working so she decided to come into the emergency department.  Does follow with palliative care and they have sent her which she believes is fentanyl but she has not been able to pick it up.  Says that she is not ready for hospice because she is looking into a clinical trial at [facility].       Home Medications Prior to Admission medications   Medication Sig Start Date End Date Taking? Authorizing Provider  acetaminophen (TYLENOL) 500 MG tablet Take 1,000 mg by mouth every 6 (six) hours as needed for mild pain (pain score 1-3) or headache.    [provider]  apixaban (ELIQUIS) 5 MG TABS tablet Take 2 tablets (10 mg total) by mouth 2 (two) times daily for 4 days, THEN 1 tablet (5 mg total) 2 (two) times daily. 04/28/23 06/01/23  Regalado, Belkys A, MD  eletriptan (RELPAX) 40 MG tablet Take 1 tablet (40 mg total) by mouth as needed for migraine or headache. May repeat in 2 hours if needed. Patient taking differently: Take 40 mg by mouth as needed for migraine or headache (and may repeat in 2 hours, if no relief). 09/22/14   Nilda Riggs, NP   HYDROmorphone (DILAUDID) 4 MG tablet Take 1-1.5 tablets (4-6 mg total) by mouth every 3 (three) hours as needed for up to 5 days for severe pain (pain score 7-10). 04/28/23 05/03/23  Regalado, Belkys A, MD  lidocaine-prilocaine (EMLA) cream Apply 1 Application topically as needed. Patient taking differently: Apply 1 Application topically as needed (for port access). 09/29/22   Pickenpack-Cousar, Arty Baumgartner, NP  LORazepam (ATIVAN) 0.5 MG tablet Take 0.5 mg by mouth daily as needed for anxiety. 03/22/22   [provider]  methadone (DOLOPHINE) 5 MG tablet Take 1 tablet (5 mg total) by mouth every 8 (eight) hours. 05/02/23   Pickenpack-Cousar, Arty Baumgartner, NP  naloxegol oxalate (MOVANTIK) 12.5 MG TABS tablet Take 1 tablet (12.5 mg total) by mouth daily. 05/02/23   Pickenpack-Cousar, Arty Baumgartner, NP  OLANZapine (ZYPREXA) 10 MG tablet TAKE 1 TABLET BY MOUTH EVERYDAY AT BEDTIME Patient taking differently: Take 10 mg by mouth daily as needed. 04/20/22   Georga Kaufmann T, PA-C  ondansetron (ZOFRAN) 8 MG tablet Take 1 tablet (8 mg total) by mouth every 8 (eight) hours as needed for nausea or vomiting. 03/14/22   Briant Cedar, PA-C  pantoprazole (PROTONIX) 40 MG tablet Take 40 mg by mouth daily as needed (for GERD-like symptoms). 03/17/23   [provider]  polyethylene glycol (MIRALAX / GLYCOLAX) 17 g packet Take 17 g by mouth 3 (three) times daily.  04/19/23 07/18/23  [place], Alvira Philips, DO  promethazine (PHENERGAN) 25 MG tablet Take 1 tablet (25 mg total) by mouth every 6 (six) hours as needed for nausea or vomiting. 05/04/22   Jaci Standard, MD  scopolamine (TRANSDERM-SCOP) 1 MG/3DAYS Place 1 patch (1.5 mg total) onto the skin every 3 (three) days. 07/22/22   Briant Cedar, PA-C  senna-docusate (SENOKOT-S) 8.6-50 MG tablet Take 2 tablets by mouth 3 (three) times daily. 04/19/23 07/18/23  [place], Eric J, DO  sodium chloride flush 0.9 % SOLN injection Place 5 mLs into feeding tube as needed. Flush each  nephrostomy tube with 5 mm as needed if noted decreased output. 04/19/23   [place], Eric J, DO  YUVAFEM 10 MCG TABS vaginal tablet Place 10 mcg vaginally 2 (two) times a week. 03/03/23   [provider]      Allergies    Compazine [prochlorperazine edisylate], Propofol, Oxycodone-acetaminophen, Prochlorperazine, and Labetalol    Review of Systems   Review of Systems  Physical Exam Updated Vital Signs BP (!) 150/95 (BP Location: Right Arm)   Pulse (!) 116   Temp 98.9 F (37.2 C) (Oral)   Resp 18   LMP 06/26/2013 Comment: low dose BCP  SpO2 100%  Physical Exam Vitals and nursing note reviewed.  Constitutional:      General: She is not in acute distress.    Appearance: She is well-developed.  HENT:     Head: Normocephalic and atraumatic.     Right Ear: External ear normal.     Left Ear: External ear normal.     Nose: Nose normal.  Eyes:     Extraocular Movements: Extraocular movements intact.     Conjunctiva/sclera: Conjunctivae normal.     Pupils: Pupils are equal, round, and reactive to light.  Cardiovascular:     Rate and Rhythm: Tachycardia present.  Pulmonary:     Effort: Pulmonary effort is normal. No respiratory distress.  Abdominal:     General: Abdomen is flat. There is no distension.     Palpations: Abdomen is soft. There is no mass.     Tenderness: There is abdominal tenderness (Periumbilical). There is no guarding.     Comments: Bilateral nephrostomy tube sites appear clean dry and intact.  No discharge.  No leaking urine noted.  Draining straw-colored urine from nephrostomy tubes.  Musculoskeletal:     Cervical back: Normal range of motion and neck supple.  Skin:    General: Skin is warm and dry.  Neurological:     Mental Status: She is alert and oriented to person, place, and time. Mental status is at baseline.  Psychiatric:        Mood and Affect: Mood normal.     ED Results / Procedures / Treatments   Labs (all labs ordered are listed, but  only abnormal results are displayed) Labs Reviewed  CBC WITH DIFFERENTIAL/PLATELET - Abnormal; Notable for the following components:      Result Value   WBC 10.8 (*)    Hemoglobin 11.2 (*)    RDW 17.5 (*)    Platelets 110 (*)    Neutro Abs 8.8 (*)    Monocytes Absolute 1.1 (*)    All other components within normal limits  URINALYSIS, ROUTINE W REFLEX MICROSCOPIC - Abnormal; Notable for the following components:   Hgb urine dipstick MODERATE (*)    Ketones, ur 20 (*)    Leukocytes,Ua TRACE (*)    Bacteria, UA RARE (*)  All other components within normal limits  BASIC METABOLIC PANEL    EKG None  Radiology CT ABDOMEN PELVIS W CONTRAST Result Date: 05/02/2023 CLINICAL DATA:  Pt states that she recently had bilateral nephrostomy tubes placed and overnight they began leaking. Pt has clear fluid that has saturated her bandages and her clothing. Denies any known issues with her nephrostomy tubes EXAM: CT ABDOMEN AND PELVIS WITH CONTRAST TECHNIQUE: Multidetector CT imaging of the abdomen and pelvis was performed using the standard protocol following bolus administration of intravenous contrast. RADIATION DOSE REDUCTION: This exam was performed according to the departmental dose-optimization program which includes automated exposure control, adjustment of the mA and/or kV according to patient size and/or use of iterative reconstruction technique. CONTRAST:  80mL OMNIPAQUE IOHEXOL 300 MG/ML  SOLN COMPARISON:  CT abdomen pelvis 03/17/2023 FINDINGS: Lower chest: Central venous catheter terminates within the right atrium. Hepatobiliary: Redemonstration of grossly stable several heterogeneous hypodense hepatic masses with as an example a 2.7 cm (2:12). Masses. Calcified gallstone noted within the gallbladder lumen. No gallbladder wall thickening or pericholecystic fluid. No biliary dilatation. Pancreas: Known pancreatic head mass poorly visualized (2:32). Normal pancreatic contour. No surrounding  inflammatory changes. No main pancreatic ductal dilatation. Spleen: Normal in size without focal abnormality. Adrenals/Urinary Tract: No adrenal nodule bilaterally. Bilateral nephrostomy tube in grossly appropriate position. Bilateral kidneys enhance symmetrically. No hydronephrosis. No hydroureter. The urinary bladder is unremarkable. Stomach/Bowel: Stomach is within normal limits. No evidence of small bowel wall thickening or dilatation. Stool throughout the colon with increased stool burden with caliber of the large bowel measuring up to 6 cm. Poorly evaluated rectosigmoid colon walls. Appendix appears normal. Vascular/Lymphatic: Interval development of right external iliac, common femoral, femoral vein thrombosis. No abdominal aorta or iliac aneurysm. Mild atherosclerotic plaque of the aorta and its branches. Interval increase in size of retroperitoneal and right pelvic sidewall lymphadenopathy with as an example a pericaval 1.7 cm (from 1.3 cm) lymph node (2:50). no lymphadenopathy. Reproductive: Status post hysterectomy. Other: No intraperitoneal free fluid. No intraperitoneal free gas. No organized fluid collection. Musculoskeletal: No abdominal wall hernia or abnormality. No suspicious lytic or blastic osseous lesions. No acute displaced fracture. IMPRESSION: 1. Interval development of deep venous thrombosis of the right external iliac, common femoral, femoral vein. 2. Constipation with no stercoral colitis. 3. Interval increase in size of retroperitoneal and right pelvic sidewall lymphadenopathy. 4. Stable hepatic metastases. 5. Known pancreatic head mass poorly visualized. 6. Bilateral percutaneous nephrostomy tubes in grossly appropriate position with interval resolution of hydroureteronephrosis bilaterally. 7. Cholelithiasis with no CT evidence of acute cholecystitis. These results were called by telephone at the time of interpretation on 05/02/2023 at 9:48 pm to provider Vonita Moss , who verbally  acknowledged these results. Electronically Signed   By: Tish Frederickson M.D.   On: 05/02/2023 21:48    Procedures Procedures    Medications Ordered in ED Medications  HYDROmorphone (DILAUDID) injection 1 mg (1 mg Intravenous Given 05/02/23 1922)  sodium chloride 0.9 % bolus 1,000 mL (0 mLs Intravenous Stopped 05/02/23 2228)  HYDROmorphone (DILAUDID) injection 1 mg (1 mg Intravenous Given 05/02/23 1941)  HYDROmorphone (DILAUDID) injection 2 mg (2 mg Intravenous Given 05/02/23 2035)  iohexol (OMNIPAQUE) 300 MG/ML solution 100 mL (80 mLs Intravenous Contrast Given 05/02/23 2115)  sodium phosphate (FLEET) enema 1 enema (1 enema Rectal Given 05/02/23 2228)  ketorolac (TORADOL) 15 MG/ML injection 15 mg (15 mg Intravenous Given 05/02/23 2228)  HYDROmorphone (DILAUDID) injection 2 mg (2 mg Intravenous Given 05/02/23 2229)  ED Course/ Medical Decision Making/ A&P Clinical Course as of 05/02/23 2331  Tue May 02, 2023  2238 Dr Hetty Blend from vascular surgery consulted but feels with her metastatic disease would not be a thrombectomy candidate. [RP]    Clinical Course User Index [RP] Rondel Baton, MD                                 Medical Decision Making Amount and/or Complexity of Data Reviewed Radiology: ordered.  Risk OTC drugs. Prescription drug management.   Kaitlin Ferrell is a 58 y.o. female with comorbidities that complicate the patient evaluation including metastatic pancreatic cancer not on treatment due to progression, portal vein occlusion, DVT on Eliquis, with chronic pain who presents emergency department with lower abdominal pain and concern for leaking nephrostomy tube.   Initial Ddx:  Metastatic cancer, nephrostomy tube dislodged, obstruction, UTI  MDM/Course:  Patient presented to the emergency department with lower abdominal pain and concerns that her nephrostomy tubes may be leaking.  I do not see any apparent leaking on exam.  Not having any fevers or  signs of pyelonephritis.  Urine was sent and does not show signs of UTI.  She had a CT scan which did not show evidence of nephrostomy tube displacement or hydronephrosis suggesting obstruction.  Did show significant stool burden which I suspect may be causing some of her lower abdominal pain as well as some lymph nodes that have grown which I suspect could be contributing as well.  Was given an enema.  Was given multiple rounds of pain medication as well and upon re-evaluation was feeling better.  On her last admission was started on Eliquis because of a right lower extremity DVT that included the right external iliac vein.  I do not see a vascular surgery note so did talk to vascular surgery today and unfortunately she is not a candidate for any sort of intervention because of her metastatic cancer.  They recommended continuing her Eliquis.  She is not having any chest pain or shortness of breath at this time.  Did counsel the patient to return immediately to the emergency department if she experiences any of these.  Will have her follow-up with her primary doctor and talk to her interventional team about her nephrostomy concerns.   This patient presents to the ED for concern of complaints listed in HPI, this involves an extensive number of treatment options, and is a complaint that carries with it a high risk of complications and morbidity. Disposition including potential need for admission considered.   Dispo: DC Home. Return precautions discussed including, but not limited to, those listed in the AVS. Allowed pt time to ask questions which were answered fully prior to dc.  Additional history obtained from spouse Records reviewed Outpatient Clinic Notes The following labs were independently interpreted: Chemistry and show no acute abnormality I independently reviewed the following imaging with scope of interpretation limited to determining acute life threatening conditions related to emergency care: CT  Abdomen/Pelvis and agree with the radiologist interpretation with the following exceptions: none I personally reviewed and interpreted cardiac monitoring: normal sinus rhythm  I personally reviewed and interpreted the pt's EKG: see above for interpretation  I have reviewed the patients home medications and made adjustments as needed  Portions of this note were generated with Dragon dictation software. Dictation errors may occur despite best attempts at proofreading.     Final Clinical  Impression(s) / ED Diagnoses Final diagnoses:  Lower abdominal pain  Nephrostomy present (HCC)  Malignant neoplasm of pancreas, unspecified location of malignancy (HCC)  Acute deep vein thrombosis (DVT) of non-extremity vein    Rx / DC Orders ED Discharge Orders     None         Rondel Baton, MD 05/02/23 [phone]

## 2023-05-02 NOTE — ED Triage Notes (Addendum)
 Pt states that she recently had bilateral nephrostomy tubes placed and overnight they began leaking. Pt has clear fluid that has saturated her bandages and her clothing. Denies any known issues with her nephrostomy tubes and states she's still having output. Pt also states that today she began having slight vaginal bleeding. Pt denies recent fevers

## 2023-05-02 NOTE — ED Provider Triage Note (Signed)
 Emergency Medicine Provider Triage Evaluation Note  Kaitlin Ferrell , a 58 y.o. female  was evaluated in triage.  Pt complains of leakage around nephrostomy tube. Tube placed on 04/19/23. Also reports decreased urine output. Urgency but little urine production.  Review of Systems  Positive: Limited voided urine output Negative: Fever, chills, abdominal pain  Physical Exam  BP (!) 134/111   Pulse (!) 109   Temp 98.4 F (36.9 C) (Oral)   Resp 18   LMP 06/26/2013 Comment: low dose BCP  SpO2 100%  Gen:   Awake, no distress  Resp: Normal effort  MSK:   Moves extremities without difficulty  Other:  Nephrostomy tube draining yellow urine. Voided urine amount limited, but is bloody appearing.   Medical Decision Making  Medically screening exam initiated at 2:27 PM.  Appropriate orders placed.  Kaitlin Ferrell Kaitlin Ferrell was informed that the remainder of the evaluation will be completed by another provider, this initial triage assessment does not replace that evaluation, and the importance of remaining in the ED until their evaluation is complete.     Felicie Morn, NP 05/02/23 1434

## 2023-05-02 NOTE — Discharge Instructions (Signed)
 You were seen for your abdominal pain and leaking around your nephrostomy tubes in the emergency department.  It appears that the tubes are in the correct location.  There are no signs of infection at this time.  At home please continue your bowel regimen.  Follow-up with your primary doctor in 2-3 days regarding your visit.  Talk to the interventional radiology team to put the nephrostomy tubes in about the leaking.  Return immediately to the emergency department if you experience any of the following: Fevers, chills, chest pain, shortness of breath, or any other concerning symptoms.    Thank you for visiting our Emergency Department. It was a pleasure taking care of you today.

## 2023-05-02 NOTE — Progress Notes (Signed)
 Palliative Medicine [facility]  [phone] [phone]   Name: Kaitlin Ferrell Date: 05/02/2023 MRN: [id number]  DOB: 1965-08-20  Patient Care Team: Bernadette Hoit, MD as PCP - General (Family Medicine) Pickenpack-Cousar, Arty Baumgartner, NP as Nurse Practitioner (Nurse Practitioner) Jaci Standard, MD as Consulting Physician (Hematology and Oncology)   I connected with Kaitlin Ferrell on 05/02/23 at  8:30 AM EDT by telephone and verified that I am speaking with the correct person using two identifiers.   I discussed the limitations, risks, security and privacy concerns of performing an evaluation and management service by telemedicine and the availability of in-person appointments. I also discussed with the patient that there may be a patient responsible charge related to this service. The patient expressed understanding and agreed to proceed.   Other persons participating in the visit and their role in the encounter: N/A   Patient's location: Home  Provider's location: [facility]    Chief Complaint: Pain    INTERVAL HISTORY: Kaitlin Ferrell is a 58 y.o. 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 Other history includes gestational trophoblastic disease, insomnia, HTN, and migraines. Palliative ask to see for symptom and pain management and goals of care.   SOCIAL HISTORY:     reports that she has never smoked. She has never used smokeless tobacco. She reports current alcohol use of about 1.0 standard drink of alcohol per week. She reports that she does not use drugs.  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  MEDICATIONS:  Current Outpatient Medications  Medication Sig Dispense Refill   methadone (DOLOPHINE) 5 MG tablet Take 1 tablet (5 mg total) by mouth every 8 (eight) hours. 90 tablet 0   acetaminophen (TYLENOL) 500 MG tablet Take 1,000 mg by mouth every 6 (six) hours as needed for mild pain (pain score 1-3) or headache.     apixaban (ELIQUIS) 5 MG TABS tablet Take 2 tablets (10 mg total) by mouth 2 (two) times daily for 4 days, THEN 1 tablet (5 mg total) 2 (two) times daily. 76 tablet 0   eletriptan (RELPAX) 40 MG tablet Take 1 tablet (40 mg total) by mouth as needed for migraine or headache. May repeat in 2 hours if needed. (Patient taking differently: Take 40 mg by mouth as needed for migraine or headache (and may repeat in 2 hours, if no relief).) 15 tablet 6   HYDROmorphone (DILAUDID) 4 MG tablet Take 1-1.5 tablets (4-6 mg total) by mouth every 3 (three) hours as needed for up to 5 days for severe pain (pain score 7-10). 30 tablet 0   lidocaine-prilocaine (EMLA) cream Apply 1 Application topically as needed. (Patient taking differently: Apply 1 Application topically as needed (for port access).) 30 g 2   LORazepam (ATIVAN) 0.5 MG tablet Take 0.5 mg by mouth daily as needed for anxiety.  OLANZapine (ZYPREXA) 10 MG tablet TAKE 1 TABLET BY MOUTH EVERYDAY AT BEDTIME (Patient taking differently: Take 10 mg by mouth daily as needed.) 90 tablet 1   ondansetron (ZOFRAN) 8 MG tablet Take 1 tablet (8 mg total) by mouth every 8 (eight) hours as needed for nausea or vomiting. 90 tablet 0   pantoprazole (PROTONIX) 40 MG tablet Take 40 mg by mouth daily as needed (for GERD-like symptoms).      polyethylene glycol (MIRALAX / GLYCOLAX) 17 g packet Take 17 g by mouth 3 (three) times daily. 270 packet 0   promethazine (PHENERGAN) 25 MG tablet Take 1 tablet (25 mg total) by mouth every 6 (six) hours as needed for nausea or vomiting. 30 tablet 0   scopolamine (TRANSDERM-SCOP) 1 MG/3DAYS Place 1 patch (1.5 mg total) onto the skin every 3 (three) days. 10 patch 12   senna-docusate (SENOKOT-S) 8.6-50 MG tablet Take 2 tablets by mouth 3 (three) times daily. 540 tablet 0   sodium chloride flush 0.9 % SOLN injection Place 5 mLs into feeding tube as needed. Flush each nephrostomy tube with 5 mm as needed if noted decreased output. 100 mL 2   YUVAFEM 10 MCG TABS vaginal tablet Place 10 mcg vaginally 2 (two) times a week.     No current facility-administered medications for this visit.    VITAL SIGNS: LMP 06/26/2013 Comment: low dose BCP There were no vitals filed for this visit.  Estimated body mass index is 21.22 kg/m as calculated from the following:   Height as of 04/22/23: 5\' 2"  (1.575 m).   Weight as of 04/22/23: 116 lb (52.6 kg).   PERFORMANCE STATUS (ECOG) : 1 - Symptomatic but completely ambulatory  IMPRESSION:  I connected with Kaitlin Ferrell "Kaitlin Ferrell" for symptom management follow-up. Unfortunately she has been having worsening abdominal pain despite current pain regimen. Recently admitted in the hospital for pain control requiring use of PCA pump during that time. Pain was stable at the time of discharge. Denies nausea, vomiting, or diarrhea. Ongoing fatigue. Appetite fluctuates. Some days are better than others. Mainly impacted by level of pain.   She is challenged with ongoing constipation. Current regimen consist of Miralax twice daily and Senna-S 2 tablets twice daily. Regimen is minimally effective.  Education provided on the use of prescription strength opioid-induced constipation medication.  Patient states she previously tried Linzess with no improvement.  Advised we  will trial Movantik versus Amitiza pending insurance approval.  She verbalized understanding.  We discussed her pain at length. She is reporting worsening abdominal and back pain. The pain is constant, sharp, throbbing, and aching. Radiates across entire stomach.  Reports her pain is in her lower abdomen/pubis area describing it as ovulation pain during menstrual cycle but worse.  Her discomfort is causing her to walk bent over as it worsens when she stands up straight.  Kaitlin Ferrell's current regimen includes Dilaudid 4-6 mg every 3 hours as needed for breakthrough pain, gabapentin 600 mg twice daily and 300 mg midday.  Instructed patient to increase to 600 mg 3 times daily.  MS Contin 60 mg every 8 hours.  Education provided on the use of methadone versus fentanyl patch given uncontrolled pain.  Will proceed with use of methadone 3 times daily.  Education provided on administration, efficacy, potential side effects, and timing of administration related to other medications.  Patient verbalized understanding.  Will begin taking midday once picked up from pharmacy in the meantime patient will continue MS Contin until new  prescriptions have been obtained.   Goals of Care  We discussed her current illness and what it means in the larger context of her on-going co-morbidities. Natural disease trajectory and expectations were discussed.  Kaitlin Ferrell is realistic in her understanding.  States wishes to continue to treat the treatable while managing her symptoms aggressively.  She is not prepared to accept hospice at this time as she is waiting for her oncologist to let her know if she is a candidate for clinical trial at [facility].  I discussed with patient her ongoing pain and declining quality of life.  I empathetically expressed concerns that at some point she will be more hospice appropriate if she continues to require extensive pain management.  Education provided on the differences between palliative and hospice  and what care would look like for patient in the home with understanding the goal is to focus on her comfort while allowing her to continue to live her life as she desires.  I also shared that patient could potentially utilize home PCA under hospice care for better pain management.  She verbalized understanding and appreciation.  Knows that anytime she wishes to transition her care to hospice she can notify medical team.  04/10/23: We discussed her current illness and what it means in the larger context of her on-going co-morbidities. Natural disease trajectory and expectations were discussed. Kaitlin Ferrell is realistic in her current illness. She is aware that her cancer continues to progress and all treatments have been maximized. Her quality of life is most important to her. She wishes to continue to treat the treatable allowing her every opportunity to continue thriving for as long as she can while aggressively managing her symptoms to minimize discomfort, pain, or suffering. She is taking things one day at a time.    07/07/22- Patient and husband are realistic in their understanding of her cancer diagnosis and trajectory. She shares they have also discussed at length with her children. Patient states she chose to pursue chemotherapy break due to significance of her symptoms. She was extremely fatigue, weak, could not get out of bed, no appetite, weight loss, nausea, vomiting, and diarrhea. Kaitlin Ferrell shares she was unable to perform ADLs and husband was having to bathe her. They were concerned that she was facing end-of-life given how poor her quality of life was. Patient and husband speaks to appreciation of her improvement in quality of life over the past several weeks.    I created space and opportunity for patient and husband to discuss goals and hopes. Patient is trying to focus on healthy food options and be as active as she can. They speak to plans of watchful waiting to see what upcoming CT scan results  show in terms of her cancer. She will then consider if she would like to pursue additional treatment options versus not. They are clear in their understanding what pros and cons are of pursing or not to pursue further treatment including end-of-life. Kaitlin Ferrell speaks to her quality of life is most important over quantity. She would not want to spend last moments in a stupor suffering state. Emotional support provided.   We discussed Her current illness and what it means in the larger context of Her on-going co-morbidities. Natural disease trajectory and expectations were discussed.  I discussed the importance of continued conversation with family and their medical providers regarding overall plan of care and treatment options, ensuring decisions are within the context of the patients values and GOCs. Assessment & Plan  Cancer Related Pain Reports of uncontrolled pain in lower back and abdomen despite pain regimen.  -Continue hydromorphone 4 to 6 mg every 3 hours as needed for breakthrough pain -Gabapentin 600 mg 3 times daily -Discontinue use of MS Contin 60 mg every 8 hours.  However patient will continue to take and discontinue when she obtains new prescription of methadone. -Methadone 5 mg every 8 hours.  MME conversion completed allowing for 25% cross tolerance.  Opioid-induced constipation Patient currently reports ongoing constipation despite regimen of MiraLAX and 2 senna tablets twice daily. -Movantik 12.5 mg daily.  Will closely evaluate and adjust as needed.  Goals of care Patient continues to have uncontrolled pain.  However is clearing expressed goals to continue to treat the treatable allowing her every opportunity to continue driving while aggressively managing her symptoms.  States she is hopeful that she is a candidate for clinical trial at [facility] per previous discussions with her oncologist. -Education provided on pain management and the differences between palliative and  hospice. -Ongoing goals of care discussions and support  I will plan to see patient back in 1-2 weeks.  Will follow-up by phone on Thursday or Friday for close symptom management.  Patient expressed understanding and was in agreement with this plan. She also understands that She can call the clinic at any time with any questions, concerns, or complaints.   Any controlled substances utilized were prescribed in the context of palliative care. PDMP has been reviewed.   Visit consisted of counseling and education dealing with the complex and emotionally intense issues of symptom management and palliative care in the setting of serious and potentially life-threatening illness.  Kaitlin Ferrell, AGPCNP-BC  Palliative Medicine Team/[facility]

## 2023-05-03 ENCOUNTER — Other Ambulatory Visit: Payer: Self-pay | Admitting: Nurse Practitioner

## 2023-05-03 ENCOUNTER — Ambulatory Visit: Admitting: Family Medicine

## 2023-05-03 NOTE — ED Notes (Signed)
 Pt left AMA earlier from this ED without allowing RN to de-access her port. . Pt returned to have port de-accessed. This RN de-accessed port successfully with zero issues after hep lock adm.

## 2023-05-03 NOTE — ED Notes (Signed)
Implanted port accessed, pt tolerated well.

## 2023-05-03 NOTE — ED Notes (Signed)
 Pt not in room when this nurse returned with discharge papers. This nurse not able to locate pt, other staff noted pt to be leaving with spouse out of emergency room entrance. This nurse called pt and emergency contact to notify them that pt still had port accessed and needed to return to have port de-accessed. Pt voiced that she did not want to return tonight d/t living too far from the hospital. Pt's spouse voiced that they were disappointed with their experience here and did not receive the answers they wanted, then hung up the phone on this nurse.

## 2023-05-04 ENCOUNTER — Inpatient Hospital Stay

## 2023-05-04 ENCOUNTER — Other Ambulatory Visit: Payer: Self-pay | Admitting: Hematology and Oncology

## 2023-05-08 ENCOUNTER — Telehealth: Payer: Self-pay | Admitting: *Deleted

## 2023-05-08 NOTE — Telephone Encounter (Signed)
 Received call from [place], SW with [facility] Hosp[facility]/Palliative care. She states that husband has called her and requested a Hospice referral. Advised that Dr. Leonides Schanz agrees with referral. Call made to Haynes Bast, RN liaison with [facility] and provided her with the referral. Dr. Leonides Schanz made aware.

## 2023-05-09 ENCOUNTER — Emergency Department (HOSPITAL_COMMUNITY)
Admission: EM | Admit: 2023-05-09 | Discharge: 2023-05-09 | Disposition: A | Discharge status: Home / Self Care | Attending: Emergency Medicine | Admitting: Emergency Medicine

## 2023-05-09 ENCOUNTER — Other Ambulatory Visit: Payer: Self-pay

## 2023-05-09 DIAGNOSIS — T83092A Other mechanical complication of nephrostomy catheter, initial encounter: Secondary | ICD-10-CM | POA: Diagnosis not present

## 2023-05-09 DIAGNOSIS — Z8507 Personal history of malignant neoplasm of pancreas: Secondary | ICD-10-CM | POA: Diagnosis not present

## 2023-05-09 DIAGNOSIS — Z7901 Long term (current) use of anticoagulants: Secondary | ICD-10-CM | POA: Diagnosis not present

## 2023-05-09 DIAGNOSIS — N99528 Other complication of other external stoma of urinary tract: Secondary | ICD-10-CM | POA: Insufficient documentation

## 2023-05-09 LAB — COMPREHENSIVE METABOLIC PANEL WITH GFR
ALT: 12 U/L (ref 0–44)
AST: 26 U/L (ref 15–41)
Albumin: 3.7 g/dL (ref 3.5–5.0)
Alkaline Phosphatase: 70 U/L (ref 38–126)
Anion gap: 12 (ref 5–15)
BUN: 17 mg/dL (ref 6–20)
CO2: 23 mmol/L (ref 22–32)
Calcium: 9.2 mg/dL (ref 8.9–10.3)
Chloride: 100 mmol/L (ref 98–111)
Creatinine, Ser: 0.59 mg/dL (ref 0.44–1.00)
GFR, Estimated: >60 mL/min (ref >60)
Glucose, Bld: 106 mg/dL — ABNORMAL HIGH (ref 70–99)
Potassium: 3.3 mmol/L — ABNORMAL LOW (ref 3.5–5.1)
Sodium: 135 mmol/L (ref 135–145)
Total Bilirubin: 1 mg/dL (ref 0.0–1.2)
Total Protein: 8.1 g/dL (ref 6.5–8.1)

## 2023-05-09 LAB — CBC
HCT: 34.8 % — ABNORMAL LOW (ref 36.0–46.0)
Hemoglobin: 10.8 g/dL — ABNORMAL LOW (ref 12.0–15.0)
MCH: 28.6 pg (ref 26.0–34.0)
MCHC: 31 g/dL (ref 30.0–36.0)
MCV: 92.1 fL (ref 80.0–100.0)
Platelets: 62 10*3/uL — ABNORMAL LOW (ref 150–400)
RBC: 3.78 MIL/uL — ABNORMAL LOW (ref 3.87–5.11)
RDW: 17.7 % — ABNORMAL HIGH (ref 11.5–15.5)
WBC: 9.5 10*3/uL (ref 4.0–10.5)
nRBC: 0 % (ref 0.0–0.2)

## 2023-05-09 LAB — LIPASE, BLOOD: Lipase: 34 U/L (ref 11–51)

## 2023-05-09 MED ORDER — HYDROMORPHONE HCL 2 MG PO TABS
2.0000 mg | ORAL_TABLET | Freq: Once | ORAL | Status: AC
  Administered 2023-05-09: 2 mg via ORAL
  Filled 2023-05-09: qty 1

## 2023-05-09 NOTE — ED Triage Notes (Signed)
 Patient c/o abdominal pain x 2 day. Patient report worsening abdominal pain today. Patient report taking PRN medication without relief. Family report increase AMS. Family report patient pulled her bilateral nephrostomy tube today and husband report he put it back in place. Patient denies N/V. Patient denies Fever.

## 2023-05-09 NOTE — ED Provider Notes (Signed)
 [place] EMERGENCY DEPARTMENT AT [facility] Provider Note   CSN: [id number] Arrival date & time: 05/09/23  1657     History  Chief Complaint  Patient presents with   Altered Mental Status   Abdominal Pain    Kaitlin Ferrell is a 58 y.o. female.  Patient BIB husband with complaint of nephrostomy tube complication. She has bilateral nephrostomies placed via IR on 3/10. She has a history of metastatic pancreatic cancer not on treatment due to progression, portal vein occlusion, DVT on Eliquis, with chronic pain and bilateral hydronephrosis requiring nephrostomy. Per husband, the patient cut the tubing earlier today because it was irritating her cutting away the luer. He was able to able to insert the nephrostomy tubing into the larger tubing of the collection bag satisfactorily to allow it to continue draining without leakage. He brings her to the ED to have the tubing fixed. He also reports she has been bleeding vaginally, sometimes quite heavy, for the past 2 weeks. She is s/p hysterectomy.  The history is provided by the patient and the spouse. No language interpreter was used.  Altered Mental Status Associated symptoms: abdominal pain   Abdominal Pain      Home Medications Prior to Admission medications   Medication Sig Start Date End Date Taking? Authorizing Provider  acetaminophen (TYLENOL) 500 MG tablet Take 1,000 mg by mouth every 6 (six) hours as needed for mild pain (pain score 1-3) or headache.    [provider]  apixaban (ELIQUIS) 5 MG TABS tablet Take 2 tablets (10 mg total) by mouth 2 (two) times daily for 4 days, THEN 1 tablet (5 mg total) 2 (two) times daily. 04/28/23 06/01/23  Regalado, Belkys A, MD  eletriptan (RELPAX) 40 MG tablet Take 1 tablet (40 mg total) by mouth as needed for migraine or headache. May repeat in 2 hours if needed. Patient taking differently: Take 40 mg by mouth as needed for migraine or headache (and may repeat in 2  hours, if no relief). 09/22/14   Nilda Riggs, NP  lidocaine-prilocaine (EMLA) cream Apply 1 Application topically as needed. Patient taking differently: Apply 1 Application topically as needed (for port access). 09/29/22   Pickenpack-Cousar, Arty Baumgartner, NP  LORazepam (ATIVAN) 0.5 MG tablet Take 0.5 mg by mouth daily as needed for anxiety. 03/22/22   [provider]  methadone (DOLOPHINE) 5 MG tablet Take 1 tablet (5 mg total) by mouth every 8 (eight) hours. 05/02/23   Pickenpack-Cousar, Arty Baumgartner, NP  naloxegol oxalate (MOVANTIK) 12.5 MG TABS tablet Take 1 tablet (12.5 mg total) by mouth daily. 05/02/23   Pickenpack-Cousar, Arty Baumgartner, NP  OLANZapine (ZYPREXA) 10 MG tablet TAKE 1 TABLET BY MOUTH EVERYDAY AT BEDTIME Patient taking differently: Take 10 mg by mouth daily as needed. 04/20/22   Georga Kaufmann T, PA-C  ondansetron (ZOFRAN) 8 MG tablet Take 1 tablet (8 mg total) by mouth every 8 (eight) hours as needed for nausea or vomiting. 03/14/22   Briant Cedar, PA-C  pantoprazole (PROTONIX) 40 MG tablet Take 40 mg by mouth daily as needed (for GERD-like symptoms). 03/17/23   [provider]  polyethylene glycol (MIRALAX / GLYCOLAX) 17 g packet Take 17 g by mouth 3 (three) times daily. 04/19/23 07/18/23  [place], Alvira Philips, DO  promethazine (PHENERGAN) 25 MG tablet Take 1 tablet (25 mg total) by mouth every 6 (six) hours as needed for nausea or vomiting. 05/04/22   Jaci Standard, MD  scopolamine (TRANSDERM-SCOP)  1 MG/3DAYS Place 1 patch (1.5 mg total) onto the skin every 3 (three) days. 07/22/22   Briant Cedar, PA-C  senna-docusate (SENOKOT-S) 8.6-50 MG tablet Take 2 tablets by mouth 3 (three) times daily. 04/19/23 07/18/23  [place], Eric J, DO  sodium chloride flush 0.9 % SOLN injection Place 5 mLs into feeding tube as needed. Flush each nephrostomy tube with 5 mm as needed if noted decreased output. 04/19/23   [place], Eric J, DO  YUVAFEM 10 MCG TABS vaginal tablet Place 10 mcg vaginally 2  (two) times a week. 03/03/23   [provider]      Allergies    Compazine [prochlorperazine edisylate], Propofol, Oxycodone-acetaminophen, Prochlorperazine, and Labetalol    Review of Systems   Review of Systems  Gastrointestinal:  Positive for abdominal pain.    Physical Exam Updated Vital Signs BP (!) 128/95   Pulse (!) 101   Temp 98.8 F (37.1 C) (Oral)   Resp 16   Ht 5\' 2"  (1.575 m)   Wt 52.6 kg   LMP 06/26/2013 Comment: low dose BCP  SpO2 100%   BMI 21.21 kg/m  Physical Exam Vitals and nursing note reviewed.  Constitutional:      Comments: Frail 58 yo patient  Abdominal:     Palpations: Abdomen is soft.     Tenderness: There is abdominal tenderness in the periumbilical area. There is no guarding.     Comments: Bilateral nephrostomy tubes in place. The left tube luer lock is absent with nephrostomy tube well seated in tube of the collection bag. There is urine draining to the bag and no visualized leakage at the tubing juncture.   Genitourinary:    Vagina: Normal. No bleeding.     ED Results / Procedures / Treatments   Labs (all labs ordered are listed, but only abnormal results are displayed) Labs Reviewed  COMPREHENSIVE METABOLIC PANEL WITH GFR - Abnormal; Notable for the following components:      Result Value   Potassium 3.3 (*)    Glucose, Bld 106 (*)    All other components within normal limits  CBC - Abnormal; Notable for the following components:   RBC 3.78 (*)    Hemoglobin 10.8 (*)    HCT 34.8 (*)    RDW 17.7 (*)    Platelets 62 (*)    All other components within normal limits  LIPASE, BLOOD  URINALYSIS, ROUTINE W REFLEX MICROSCOPIC    EKG None  Radiology No results found.  Procedures Procedures    Medications Ordered in ED Medications  HYDROmorphone (DILAUDID) tablet 2 mg (has no administration in time range)    ED Course/ Medical Decision Making/ A&P Clinical Course as of 05/09/23 2030  Tue May 09, 2023  2017 Patient  to eD with husband for nephrostomy complication as detailed in the HPI. Chart reviewed and she is scheduled for a tube exchange on 4/18. I discussed the situation with Dr. Fredia Sorrow with IR, who advises to secure the tube junction with tape since it is draining well and IR will reach out to the husband tomorrow to bring her in for a new tube placement. Husband is comfortable with this plan. Discussed his concern for vaginal bleeding. I do not see any active bleeding now. Unable to confirm if vaginal source - unlikely with history of hysterectomy - vs urethral. Reassured him that she is hemodynamically stable, without active bleeding now and this could be evaluated in the outpatient setting if persistent or worsens. Patient is  stable for discharge home.  [SU]    Clinical Course User Index [SU] Elpidio Anis, PA-C                                 Medical Decision Making Amount and/or Complexity of Data Reviewed Labs: ordered.           Final Clinical Impression(s) / ED Diagnoses Final diagnoses:  Nephrostomy complication [facility])    Rx / DC Orders ED Discharge Orders     None         Elpidio Anis, PA-C 05/09/23 2030    Derwood Kaplan, MD 05/10/23 1720

## 2023-05-09 NOTE — Discharge Instructions (Signed)
 As we discussed, IR will reach out to you tomorrow to arrange a time to repair or replace the left nephrostomy tube.   Continue your regular medications as scheduled.   If bleeding the vaginal area continues or worsens, reach out to your doctor to discuss options.

## 2023-05-10 ENCOUNTER — Other Ambulatory Visit (HOSPITAL_COMMUNITY): Payer: Self-pay | Admitting: Interventional Radiology

## 2023-05-10 ENCOUNTER — Ambulatory Visit (HOSPITAL_COMMUNITY)
Admission: RE | Admit: 2023-05-10 | Discharge: 2023-05-10 | Disposition: A | Discharge status: Home / Self Care | Source: Ambulatory Visit | Attending: Interventional Radiology | Admitting: Interventional Radiology

## 2023-05-10 DIAGNOSIS — N1339 Other hydronephrosis: Secondary | ICD-10-CM | POA: Insufficient documentation

## 2023-05-10 DIAGNOSIS — Z436 Encounter for attention to other artificial openings of urinary tract: Secondary | ICD-10-CM | POA: Insufficient documentation

## 2023-05-10 HISTORY — PX: IR NEPHROSTOMY EXCHANGE LEFT: IMG6069

## 2023-05-10 MED ORDER — LIDOCAINE HCL 1 % IJ SOLN
20.0000 mL | Freq: Once | INTRAMUSCULAR | Status: AC
  Administered 2023-05-10: 10 mL via INTRADERMAL

## 2023-05-10 MED ORDER — LIDOCAINE VISCOUS HCL 2 % MT SOLN
OROMUCOSAL | Status: AC
  Filled 2023-05-10: qty 15

## 2023-05-10 MED ORDER — IOHEXOL 300 MG/ML  SOLN
50.0000 mL | Freq: Once | INTRAMUSCULAR | Status: AC | PRN
  Administered 2023-05-10: 20 mL

## 2023-05-10 MED ORDER — LIDOCAINE HCL 1 % IJ SOLN
INTRAMUSCULAR | Status: AC
  Filled 2023-05-10: qty 20

## 2023-05-11 DIAGNOSIS — K219 Gastro-esophageal reflux disease without esophagitis: Secondary | ICD-10-CM | POA: Diagnosis not present

## 2023-05-11 DIAGNOSIS — I82401 Acute embolism and thrombosis of unspecified deep veins of right lower extremity: Secondary | ICD-10-CM | POA: Diagnosis not present

## 2023-05-11 DIAGNOSIS — C787 Secondary malignant neoplasm of liver and intrahepatic bile duct: Secondary | ICD-10-CM | POA: Diagnosis not present

## 2023-05-11 DIAGNOSIS — F419 Anxiety disorder, unspecified: Secondary | ICD-10-CM | POA: Diagnosis not present

## 2023-05-11 DIAGNOSIS — R1084 Generalized abdominal pain: Secondary | ICD-10-CM | POA: Diagnosis not present

## 2023-05-11 DIAGNOSIS — I81 Portal vein thrombosis: Secondary | ICD-10-CM | POA: Diagnosis not present

## 2023-05-11 DIAGNOSIS — N1330 Unspecified hydronephrosis: Secondary | ICD-10-CM | POA: Diagnosis not present

## 2023-05-11 DIAGNOSIS — F32A Depression, unspecified: Secondary | ICD-10-CM | POA: Diagnosis not present

## 2023-05-11 DIAGNOSIS — I10 Essential (primary) hypertension: Secondary | ICD-10-CM | POA: Diagnosis not present

## 2023-05-11 DIAGNOSIS — G43909 Migraine, unspecified, not intractable, without status migrainosus: Secondary | ICD-10-CM | POA: Diagnosis not present

## 2023-05-11 DIAGNOSIS — C259 Malignant neoplasm of pancreas, unspecified: Secondary | ICD-10-CM | POA: Diagnosis not present

## 2023-05-12 DIAGNOSIS — I82401 Acute embolism and thrombosis of unspecified deep veins of right lower extremity: Secondary | ICD-10-CM | POA: Diagnosis not present

## 2023-05-12 DIAGNOSIS — K219 Gastro-esophageal reflux disease without esophagitis: Secondary | ICD-10-CM | POA: Diagnosis not present

## 2023-05-12 DIAGNOSIS — C259 Malignant neoplasm of pancreas, unspecified: Secondary | ICD-10-CM | POA: Diagnosis not present

## 2023-05-12 DIAGNOSIS — F419 Anxiety disorder, unspecified: Secondary | ICD-10-CM | POA: Diagnosis not present

## 2023-05-12 DIAGNOSIS — G43909 Migraine, unspecified, not intractable, without status migrainosus: Secondary | ICD-10-CM | POA: Diagnosis not present

## 2023-05-12 DIAGNOSIS — I81 Portal vein thrombosis: Secondary | ICD-10-CM | POA: Diagnosis not present

## 2023-05-12 DIAGNOSIS — C787 Secondary malignant neoplasm of liver and intrahepatic bile duct: Secondary | ICD-10-CM | POA: Diagnosis not present

## 2023-05-12 DIAGNOSIS — R1084 Generalized abdominal pain: Secondary | ICD-10-CM | POA: Diagnosis not present

## 2023-05-12 DIAGNOSIS — I10 Essential (primary) hypertension: Secondary | ICD-10-CM | POA: Diagnosis not present

## 2023-05-12 DIAGNOSIS — F32A Depression, unspecified: Secondary | ICD-10-CM | POA: Diagnosis not present

## 2023-05-12 DIAGNOSIS — N1330 Unspecified hydronephrosis: Secondary | ICD-10-CM | POA: Diagnosis not present

## 2023-05-13 DIAGNOSIS — F32A Depression, unspecified: Secondary | ICD-10-CM | POA: Diagnosis not present

## 2023-05-13 DIAGNOSIS — C259 Malignant neoplasm of pancreas, unspecified: Secondary | ICD-10-CM | POA: Diagnosis not present

## 2023-05-13 DIAGNOSIS — I81 Portal vein thrombosis: Secondary | ICD-10-CM | POA: Diagnosis not present

## 2023-05-13 DIAGNOSIS — R1084 Generalized abdominal pain: Secondary | ICD-10-CM | POA: Diagnosis not present

## 2023-05-13 DIAGNOSIS — G43909 Migraine, unspecified, not intractable, without status migrainosus: Secondary | ICD-10-CM | POA: Diagnosis not present

## 2023-05-13 DIAGNOSIS — I10 Essential (primary) hypertension: Secondary | ICD-10-CM | POA: Diagnosis not present

## 2023-05-13 DIAGNOSIS — C787 Secondary malignant neoplasm of liver and intrahepatic bile duct: Secondary | ICD-10-CM | POA: Diagnosis not present

## 2023-05-13 DIAGNOSIS — N1330 Unspecified hydronephrosis: Secondary | ICD-10-CM | POA: Diagnosis not present

## 2023-05-13 DIAGNOSIS — K219 Gastro-esophageal reflux disease without esophagitis: Secondary | ICD-10-CM | POA: Diagnosis not present

## 2023-05-13 DIAGNOSIS — F419 Anxiety disorder, unspecified: Secondary | ICD-10-CM | POA: Diagnosis not present

## 2023-05-13 DIAGNOSIS — I82401 Acute embolism and thrombosis of unspecified deep veins of right lower extremity: Secondary | ICD-10-CM | POA: Diagnosis not present

## 2023-05-14 DIAGNOSIS — R1084 Generalized abdominal pain: Secondary | ICD-10-CM | POA: Diagnosis not present

## 2023-05-14 DIAGNOSIS — C787 Secondary malignant neoplasm of liver and intrahepatic bile duct: Secondary | ICD-10-CM | POA: Diagnosis not present

## 2023-05-14 DIAGNOSIS — F419 Anxiety disorder, unspecified: Secondary | ICD-10-CM | POA: Diagnosis not present

## 2023-05-14 DIAGNOSIS — N1330 Unspecified hydronephrosis: Secondary | ICD-10-CM | POA: Diagnosis not present

## 2023-05-14 DIAGNOSIS — I81 Portal vein thrombosis: Secondary | ICD-10-CM | POA: Diagnosis not present

## 2023-05-14 DIAGNOSIS — F32A Depression, unspecified: Secondary | ICD-10-CM | POA: Diagnosis not present

## 2023-05-14 DIAGNOSIS — G43909 Migraine, unspecified, not intractable, without status migrainosus: Secondary | ICD-10-CM | POA: Diagnosis not present

## 2023-05-14 DIAGNOSIS — C259 Malignant neoplasm of pancreas, unspecified: Secondary | ICD-10-CM | POA: Diagnosis not present

## 2023-05-14 DIAGNOSIS — I10 Essential (primary) hypertension: Secondary | ICD-10-CM | POA: Diagnosis not present

## 2023-05-14 DIAGNOSIS — K219 Gastro-esophageal reflux disease without esophagitis: Secondary | ICD-10-CM | POA: Diagnosis not present

## 2023-05-14 DIAGNOSIS — I82401 Acute embolism and thrombosis of unspecified deep veins of right lower extremity: Secondary | ICD-10-CM | POA: Diagnosis not present

## 2023-05-15 DIAGNOSIS — F419 Anxiety disorder, unspecified: Secondary | ICD-10-CM | POA: Diagnosis not present

## 2023-05-15 DIAGNOSIS — C259 Malignant neoplasm of pancreas, unspecified: Secondary | ICD-10-CM | POA: Diagnosis not present

## 2023-05-15 DIAGNOSIS — I81 Portal vein thrombosis: Secondary | ICD-10-CM | POA: Diagnosis not present

## 2023-05-15 DIAGNOSIS — F32A Depression, unspecified: Secondary | ICD-10-CM | POA: Diagnosis not present

## 2023-05-15 DIAGNOSIS — N1330 Unspecified hydronephrosis: Secondary | ICD-10-CM | POA: Diagnosis not present

## 2023-05-15 DIAGNOSIS — I82401 Acute embolism and thrombosis of unspecified deep veins of right lower extremity: Secondary | ICD-10-CM | POA: Diagnosis not present

## 2023-05-15 DIAGNOSIS — C787 Secondary malignant neoplasm of liver and intrahepatic bile duct: Secondary | ICD-10-CM | POA: Diagnosis not present

## 2023-05-15 DIAGNOSIS — R1084 Generalized abdominal pain: Secondary | ICD-10-CM | POA: Diagnosis not present

## 2023-05-15 DIAGNOSIS — K219 Gastro-esophageal reflux disease without esophagitis: Secondary | ICD-10-CM | POA: Diagnosis not present

## 2023-05-15 DIAGNOSIS — G43909 Migraine, unspecified, not intractable, without status migrainosus: Secondary | ICD-10-CM | POA: Diagnosis not present

## 2023-05-15 DIAGNOSIS — I10 Essential (primary) hypertension: Secondary | ICD-10-CM | POA: Diagnosis not present

## 2023-05-16 ENCOUNTER — Inpatient Hospital Stay: Admitting: Hematology and Oncology

## 2023-05-16 ENCOUNTER — Inpatient Hospital Stay

## 2023-05-16 DIAGNOSIS — I82401 Acute embolism and thrombosis of unspecified deep veins of right lower extremity: Secondary | ICD-10-CM | POA: Diagnosis not present

## 2023-05-16 DIAGNOSIS — C787 Secondary malignant neoplasm of liver and intrahepatic bile duct: Secondary | ICD-10-CM | POA: Diagnosis not present

## 2023-05-16 DIAGNOSIS — I10 Essential (primary) hypertension: Secondary | ICD-10-CM | POA: Diagnosis not present

## 2023-05-16 DIAGNOSIS — R1084 Generalized abdominal pain: Secondary | ICD-10-CM | POA: Diagnosis not present

## 2023-05-16 DIAGNOSIS — G43909 Migraine, unspecified, not intractable, without status migrainosus: Secondary | ICD-10-CM | POA: Diagnosis not present

## 2023-05-16 DIAGNOSIS — F419 Anxiety disorder, unspecified: Secondary | ICD-10-CM | POA: Diagnosis not present

## 2023-05-16 DIAGNOSIS — F32A Depression, unspecified: Secondary | ICD-10-CM | POA: Diagnosis not present

## 2023-05-16 DIAGNOSIS — K219 Gastro-esophageal reflux disease without esophagitis: Secondary | ICD-10-CM | POA: Diagnosis not present

## 2023-05-16 DIAGNOSIS — N1330 Unspecified hydronephrosis: Secondary | ICD-10-CM | POA: Diagnosis not present

## 2023-05-16 DIAGNOSIS — I81 Portal vein thrombosis: Secondary | ICD-10-CM | POA: Diagnosis not present

## 2023-05-16 DIAGNOSIS — C259 Malignant neoplasm of pancreas, unspecified: Secondary | ICD-10-CM | POA: Diagnosis not present

## 2023-05-17 DIAGNOSIS — I10 Essential (primary) hypertension: Secondary | ICD-10-CM | POA: Diagnosis not present

## 2023-05-17 DIAGNOSIS — K219 Gastro-esophageal reflux disease without esophagitis: Secondary | ICD-10-CM | POA: Diagnosis not present

## 2023-05-17 DIAGNOSIS — F32A Depression, unspecified: Secondary | ICD-10-CM | POA: Diagnosis not present

## 2023-05-17 DIAGNOSIS — I82401 Acute embolism and thrombosis of unspecified deep veins of right lower extremity: Secondary | ICD-10-CM | POA: Diagnosis not present

## 2023-05-17 DIAGNOSIS — C787 Secondary malignant neoplasm of liver and intrahepatic bile duct: Secondary | ICD-10-CM | POA: Diagnosis not present

## 2023-05-17 DIAGNOSIS — C259 Malignant neoplasm of pancreas, unspecified: Secondary | ICD-10-CM | POA: Diagnosis not present

## 2023-05-17 DIAGNOSIS — G43909 Migraine, unspecified, not intractable, without status migrainosus: Secondary | ICD-10-CM | POA: Diagnosis not present

## 2023-05-17 DIAGNOSIS — R1084 Generalized abdominal pain: Secondary | ICD-10-CM | POA: Diagnosis not present

## 2023-05-17 DIAGNOSIS — N1330 Unspecified hydronephrosis: Secondary | ICD-10-CM | POA: Diagnosis not present

## 2023-05-17 DIAGNOSIS — I81 Portal vein thrombosis: Secondary | ICD-10-CM | POA: Diagnosis not present

## 2023-05-17 DIAGNOSIS — F419 Anxiety disorder, unspecified: Secondary | ICD-10-CM | POA: Diagnosis not present

## 2023-05-18 DIAGNOSIS — F32A Depression, unspecified: Secondary | ICD-10-CM | POA: Diagnosis not present

## 2023-05-18 DIAGNOSIS — K219 Gastro-esophageal reflux disease without esophagitis: Secondary | ICD-10-CM | POA: Diagnosis not present

## 2023-05-18 DIAGNOSIS — N1330 Unspecified hydronephrosis: Secondary | ICD-10-CM | POA: Diagnosis not present

## 2023-05-18 DIAGNOSIS — I81 Portal vein thrombosis: Secondary | ICD-10-CM | POA: Diagnosis not present

## 2023-05-18 DIAGNOSIS — I10 Essential (primary) hypertension: Secondary | ICD-10-CM | POA: Diagnosis not present

## 2023-05-18 DIAGNOSIS — F419 Anxiety disorder, unspecified: Secondary | ICD-10-CM | POA: Diagnosis not present

## 2023-05-18 DIAGNOSIS — R1084 Generalized abdominal pain: Secondary | ICD-10-CM | POA: Diagnosis not present

## 2023-05-18 DIAGNOSIS — G43909 Migraine, unspecified, not intractable, without status migrainosus: Secondary | ICD-10-CM | POA: Diagnosis not present

## 2023-05-18 DIAGNOSIS — C787 Secondary malignant neoplasm of liver and intrahepatic bile duct: Secondary | ICD-10-CM | POA: Diagnosis not present

## 2023-05-18 DIAGNOSIS — C259 Malignant neoplasm of pancreas, unspecified: Secondary | ICD-10-CM | POA: Diagnosis not present

## 2023-05-18 DIAGNOSIS — I82401 Acute embolism and thrombosis of unspecified deep veins of right lower extremity: Secondary | ICD-10-CM | POA: Diagnosis not present

## 2023-05-26 ENCOUNTER — Other Ambulatory Visit (HOSPITAL_COMMUNITY)

## 2023-06-06 ENCOUNTER — Other Ambulatory Visit (HOSPITAL_COMMUNITY)

## 2023-06-06 ENCOUNTER — Ambulatory Visit (HOSPITAL_COMMUNITY)

## 2023-06-08 DEATH — deceased

## 2023-07-12 ENCOUNTER — Other Ambulatory Visit (HOSPITAL_COMMUNITY)

## 2023-11-16 NOTE — Telephone Encounter (Signed)
 error
